# Patient Record
Sex: Male | Born: 1955 | Race: White | Hispanic: No | Marital: Married | Smoking: Former smoker
Health system: Southern US, Community
[De-identification: ages and names within clinical notes are randomized; demographics above are authoritative.]

## PROBLEM LIST (undated history)

## (undated) DIAGNOSIS — I2510 Atherosclerotic heart disease of native coronary artery without angina pectoris: Secondary | ICD-10-CM

## (undated) DIAGNOSIS — I213 ST elevation (STEMI) myocardial infarction of unspecified site: Secondary | ICD-10-CM

## (undated) DIAGNOSIS — I10 Essential (primary) hypertension: Secondary | ICD-10-CM

## (undated) DIAGNOSIS — R599 Enlarged lymph nodes, unspecified: Secondary | ICD-10-CM

## (undated) DIAGNOSIS — C9110 Chronic lymphocytic leukemia of B-cell type not having achieved remission: Secondary | ICD-10-CM

## (undated) HISTORY — PX: REFRACTIVE SURGERY: SHX103

## (undated) HISTORY — DX: Atherosclerotic heart disease of native coronary artery without angina pectoris: I25.10

---

## 2001-08-27 ENCOUNTER — Encounter: Payer: Self-pay | Admitting: Family Medicine

## 2001-08-27 ENCOUNTER — Ambulatory Visit (HOSPITAL_COMMUNITY): Admission: RE | Admit: 2001-08-27 | Discharge: 2001-08-27 | Payer: Self-pay | Admitting: Family Medicine

## 2004-12-24 ENCOUNTER — Other Ambulatory Visit: Admission: RE | Admit: 2004-12-24 | Discharge: 2004-12-24 | Payer: Self-pay | Admitting: Dermatology

## 2014-05-13 NOTE — H&P (Signed)
  NTS SOAP Note  Vital Signs:  Vitals as of: 89/37/3428: Systolic 768: Diastolic 85: Heart Rate 83: Temp 63F: Height 40ft 4in: Weight 272Lbs 0 Ounces: Pain Level 2: BMI 33.11  BMI : 33.11 kg/m2  Subjective: This 58 year old male presents for of two sebaceous cysts.  He has one on the scalp which has become larger and irritated.  Another one on his back is draining white fluid,  tender to touch.  Review of Symptoms:  Constitutional:unremarkable   Head:unremarkable Eyes:unremarkable   Nose/Mouth/Throat:unremarkable Cardiovascular:  unremarkable Respiratory:unremarkable Gastrointestinal:  unremarkable   Genitourinary:unremarkable   knee pain Hematolgic/Lymphatic:unremarkable   Allergic/Immunologic:unremarkable   Past Medical History:  Reviewed  Past Medical History  Surgical History: unremarkable Medical Problems: none Allergies: nkda Medications: none   Social History:Reviewed  Social History  Preferred Language: English Race:  White Ethnicity: Not Hispanic / Latino Age: 58 year Marital Status:  M Alcohol: socially   Smoking Status: Unknown if ever smoked reviewed on 05/13/2014 Functional Status reviewed on 05/13/2014 ------------------------------------------------ Bathing: Normal Cooking: Normal Dressing: Normal Driving: Normal Eating: Normal Managing Meds: Normal Oral Care: Normal Shopping: Normal Toileting: Normal Transferring: Normal Walking: Normal Cognitive Status reviewed on 05/13/2014 ------------------------------------------------ Attention: Normal Decision Making: Normal Language: Normal Memory: Normal Motor: Normal Perception: Normal Problem Solving: Normal Visual and Spatial: Normal   Family History:Reviewed  Family Health History Mother, Living; Diabetes mellitus, unspecified type;  Father, Deceased; Cancer unspecified;     Objective Information: General:Well appearing, well nourished in no  distress. 2.5cm irritated sebaceous cyst on scalp,  2cm draining erythematous cyst with punctum present on left upper back Heart:RRR, no murmur or gallop.  Normal S1, S2.  No S3, S4.  Lungs:  CTA bilaterally, no wheezes, rhonchi, rales.  Breathing unlabored.  Assessment:Sebaceous cysts,  scalp and back  Diagnoses: 706.2  L72.3 Epidermoid cyst of skin (Sebaceous cyst)  Procedures: 11572 - OFFICE OUTPATIENT NEW 30 MINUTES    Plan:  scheduled for excision of sebaceous cysts,  scalp and back on 05/30/14.   Patient Education:Alternative treatments to surgery were discussed with patient (and family).  Risks and benefits  of procedure were fully explained to the patient (and family) who gave informed consent. Patient/family questions were addressed.  Follow-up:Pending Surgery

## 2014-05-25 ENCOUNTER — Inpatient Hospital Stay (HOSPITAL_COMMUNITY): Admission: RE | Admit: 2014-05-25 | Payer: Self-pay | Source: Ambulatory Visit

## 2014-05-30 ENCOUNTER — Ambulatory Visit (HOSPITAL_COMMUNITY): Admission: RE | Admit: 2014-05-30 | Payer: Self-pay | Source: Ambulatory Visit | Admitting: General Surgery

## 2014-05-30 ENCOUNTER — Encounter (HOSPITAL_COMMUNITY): Admission: RE | Payer: Self-pay | Source: Ambulatory Visit

## 2014-05-30 SURGERY — CYST REMOVAL
Anesthesia: General

## 2015-06-06 ENCOUNTER — Other Ambulatory Visit (INDEPENDENT_AMBULATORY_CARE_PROVIDER_SITE_OTHER): Payer: Self-pay | Admitting: Otolaryngology

## 2015-06-06 DIAGNOSIS — R221 Localized swelling, mass and lump, neck: Secondary | ICD-10-CM

## 2015-06-14 ENCOUNTER — Ambulatory Visit (HOSPITAL_COMMUNITY)
Admission: RE | Admit: 2015-06-14 | Discharge: 2015-06-14 | Disposition: A | Discharge status: Home / Self Care | Payer: 59 | Source: Ambulatory Visit | Attending: Otolaryngology | Admitting: Otolaryngology

## 2015-06-14 DIAGNOSIS — R590 Localized enlarged lymph nodes: Secondary | ICD-10-CM | POA: Diagnosis not present

## 2015-06-14 DIAGNOSIS — R221 Localized swelling, mass and lump, neck: Secondary | ICD-10-CM | POA: Insufficient documentation

## 2015-06-14 MED ORDER — IOHEXOL 300 MG/ML  SOLN
75.0000 mL | Freq: Once | INTRAMUSCULAR | Status: AC | PRN
  Administered 2015-06-14: 75 mL via INTRAVENOUS

## 2015-06-21 ENCOUNTER — Other Ambulatory Visit: Payer: Self-pay | Admitting: Otolaryngology

## 2015-06-21 ENCOUNTER — Encounter (HOSPITAL_BASED_OUTPATIENT_CLINIC_OR_DEPARTMENT_OTHER): Payer: Self-pay | Admitting: *Deleted

## 2015-06-27 ENCOUNTER — Encounter (HOSPITAL_BASED_OUTPATIENT_CLINIC_OR_DEPARTMENT_OTHER): Payer: Self-pay | Admitting: *Deleted

## 2015-06-27 ENCOUNTER — Ambulatory Visit (HOSPITAL_BASED_OUTPATIENT_CLINIC_OR_DEPARTMENT_OTHER)
Admission: RE | Admit: 2015-06-27 | Discharge: 2015-06-27 | Disposition: A | Discharge status: Home / Self Care | Payer: 59 | Source: Ambulatory Visit | Attending: Otolaryngology | Admitting: Otolaryngology

## 2015-06-27 ENCOUNTER — Ambulatory Visit (HOSPITAL_BASED_OUTPATIENT_CLINIC_OR_DEPARTMENT_OTHER): Payer: 59 | Admitting: Anesthesiology

## 2015-06-27 ENCOUNTER — Encounter (HOSPITAL_BASED_OUTPATIENT_CLINIC_OR_DEPARTMENT_OTHER)
Admission: RE | Disposition: A | Discharge status: Home / Self Care | Payer: Self-pay | Source: Ambulatory Visit | Attending: Otolaryngology

## 2015-06-27 DIAGNOSIS — C8591 Non-Hodgkin lymphoma, unspecified, lymph nodes of head, face, and neck: Secondary | ICD-10-CM | POA: Insufficient documentation

## 2015-06-27 DIAGNOSIS — R591 Generalized enlarged lymph nodes: Secondary | ICD-10-CM | POA: Diagnosis present

## 2015-06-27 HISTORY — PX: MASS BIOPSY: SHX5445

## 2015-06-27 HISTORY — DX: Enlarged lymph nodes, unspecified: R59.9

## 2015-06-27 SURGERY — BIOPSY, MASS, NECK
Anesthesia: General | Site: Neck | Laterality: Left

## 2015-06-27 MED ORDER — MIDAZOLAM HCL 2 MG/2ML IJ SOLN
INTRAMUSCULAR | Status: AC
  Filled 2015-06-27: qty 2

## 2015-06-27 MED ORDER — LIDOCAINE HCL (CARDIAC) 20 MG/ML IV SOLN
INTRAVENOUS | Status: DC | PRN
  Administered 2015-06-27: 50 mg via INTRAVENOUS

## 2015-06-27 MED ORDER — LIDOCAINE-EPINEPHRINE 1 %-1:100000 IJ SOLN
INTRAMUSCULAR | Status: AC
  Filled 2015-06-27: qty 1

## 2015-06-27 MED ORDER — FENTANYL CITRATE (PF) 100 MCG/2ML IJ SOLN
50.0000 ug | INTRAMUSCULAR | Status: DC | PRN
  Administered 2015-06-27: 100 ug via INTRAVENOUS

## 2015-06-27 MED ORDER — LACTATED RINGERS IV SOLN
INTRAVENOUS | Status: DC
  Administered 2015-06-27: 10 mL/h via INTRAVENOUS

## 2015-06-27 MED ORDER — LIDOCAINE HCL (CARDIAC) 20 MG/ML IV SOLN
INTRAVENOUS | Status: AC
  Filled 2015-06-27: qty 5

## 2015-06-27 MED ORDER — ONDANSETRON HCL 4 MG/2ML IJ SOLN
INTRAMUSCULAR | Status: DC | PRN
  Administered 2015-06-27: 4 mg via INTRAVENOUS

## 2015-06-27 MED ORDER — PROMETHAZINE HCL 25 MG/ML IJ SOLN: 6.2500 mg | INTRAMUSCULAR | Status: DC | PRN

## 2015-06-27 MED ORDER — SCOPOLAMINE 1 MG/3DAYS TD PT72: 1.0000 | MEDICATED_PATCH | Freq: Once | TRANSDERMAL | Status: DC

## 2015-06-27 MED ORDER — PROPOFOL 10 MG/ML IV BOLUS
INTRAVENOUS | Status: DC | PRN
  Administered 2015-06-27: 200 mg via INTRAVENOUS

## 2015-06-27 MED ORDER — LIDOCAINE-EPINEPHRINE 1 %-1:100000 IJ SOLN
INTRAMUSCULAR | Status: DC | PRN
  Administered 2015-06-27: 3 mL

## 2015-06-27 MED ORDER — HYDROCODONE-ACETAMINOPHEN 7.5-325 MG PO TABS: 1.0000 | ORAL_TABLET | Freq: Once | ORAL | Status: DC | PRN

## 2015-06-27 MED ORDER — AMOXICILLIN 875 MG PO TABS: 875.0000 mg | ORAL_TABLET | Freq: Two times a day (BID) | ORAL | Status: DC

## 2015-06-27 MED ORDER — MIDAZOLAM HCL 2 MG/2ML IJ SOLN
1.0000 mg | INTRAMUSCULAR | Status: DC | PRN
  Administered 2015-06-27: 2 mg via INTRAVENOUS

## 2015-06-27 MED ORDER — HYDROMORPHONE HCL 1 MG/ML IJ SOLN: 0.2500 mg | INTRAMUSCULAR | Status: DC | PRN

## 2015-06-27 MED ORDER — OXYCODONE-ACETAMINOPHEN 5-325 MG PO TABS: 1.0000 | ORAL_TABLET | ORAL | Status: DC | PRN

## 2015-06-27 MED ORDER — DEXAMETHASONE SODIUM PHOSPHATE 4 MG/ML IJ SOLN
INTRAMUSCULAR | Status: DC | PRN
  Administered 2015-06-27: 10 mg via INTRAVENOUS

## 2015-06-27 MED ORDER — FENTANYL CITRATE (PF) 100 MCG/2ML IJ SOLN
INTRAMUSCULAR | Status: AC
  Filled 2015-06-27: qty 2

## 2015-06-27 MED ORDER — ONDANSETRON HCL 4 MG/2ML IJ SOLN
INTRAMUSCULAR | Status: AC
  Filled 2015-06-27: qty 2

## 2015-06-27 MED ORDER — GLYCOPYRROLATE 0.2 MG/ML IJ SOLN: 0.2000 mg | Freq: Once | INTRAMUSCULAR | Status: DC | PRN

## 2015-06-27 SURGICAL SUPPLY — 67 items
APL SKNCLS STERI-STRIP NONHPOA (GAUZE/BANDAGES/DRESSINGS)
BENZOIN TINCTURE PRP APPL 2/3 (GAUZE/BANDAGES/DRESSINGS) IMPLANT
BLADE SURG 15 STRL LF DISP TIS (BLADE) ×1 IMPLANT
BLADE SURG 15 STRL SS (BLADE) ×2
CANISTER SUCT 1200ML W/VALVE (MISCELLANEOUS) ×1 IMPLANT
CLEANER CAUTERY TIP 5X5 PAD (MISCELLANEOUS) IMPLANT
CLIP TI MEDIUM 6 (CLIP) IMPLANT
CLIP TI WIDE RED SMALL 6 (CLIP) IMPLANT
CORDS BIPOLAR (ELECTRODE) ×1 IMPLANT
COVER BACK TABLE 60X90IN (DRAPES) ×2 IMPLANT
COVER MAYO STAND STRL (DRAPES) ×2 IMPLANT
DECANTER SPIKE VIAL GLASS SM (MISCELLANEOUS) IMPLANT
DRAIN JACKSON RD 7FR 3/32 (WOUND CARE) IMPLANT
DRAIN PENROSE 1/4X12 LTX STRL (WOUND CARE) IMPLANT
DRAIN TLS ROUND 10FR (DRAIN) IMPLANT
DRAPE U-SHAPE 76X120 STRL (DRAPES) ×2 IMPLANT
ELECT COATED BLADE 2.86 ST (ELECTRODE) ×2 IMPLANT
ELECT NDL BLADE 2-5/6 (NEEDLE) IMPLANT
ELECT NEEDLE BLADE 2-5/6 (NEEDLE) IMPLANT
ELECT PAIRED SUBDERMAL (MISCELLANEOUS)
ELECT REM PT RETURN 9FT ADLT (ELECTROSURGICAL) ×2
ELECTRODE PAIRED SUBDERMAL (MISCELLANEOUS) IMPLANT
ELECTRODE REM PT RTRN 9FT ADLT (ELECTROSURGICAL) ×1 IMPLANT
EVACUATOR SILICONE 100CC (DRAIN) IMPLANT
FORCEPS BIPOLAR SPETZLER 8 1.0 (NEUROSURGERY SUPPLIES) ×1 IMPLANT
GAUZE SPONGE 4X4 16PLY XRAY LF (GAUZE/BANDAGES/DRESSINGS) IMPLANT
GLOVE BIO SURGEON STRL SZ7.5 (GLOVE) ×2 IMPLANT
GLOVE SURG SS PI 7.0 STRL IVOR (GLOVE) ×1 IMPLANT
GOWN STRL REUS W/ TWL LRG LVL3 (GOWN DISPOSABLE) ×2 IMPLANT
GOWN STRL REUS W/TWL LRG LVL3 (GOWN DISPOSABLE) ×4
HEMOSTAT SURGICEL .5X2 ABSORB (HEMOSTASIS) IMPLANT
LIQUID BAND (GAUZE/BANDAGES/DRESSINGS) ×1 IMPLANT
LOCATOR NERVE 3 VOLT (DISPOSABLE) IMPLANT
NDL HYPO 25X1 1.5 SAFETY (NEEDLE) ×1 IMPLANT
NDL PRECISIONGLIDE 27X1.5 (NEEDLE) IMPLANT
NEEDLE HYPO 25X1 1.5 SAFETY (NEEDLE) ×2 IMPLANT
NEEDLE PRECISIONGLIDE 27X1.5 (NEEDLE) IMPLANT
NS IRRIG 1000ML POUR BTL (IV SOLUTION) ×2 IMPLANT
PACK BASIN DAY SURGERY FS (CUSTOM PROCEDURE TRAY) ×2 IMPLANT
PAD CLEANER CAUTERY TIP 5X5 (MISCELLANEOUS)
PENCIL BUTTON HOLSTER BLD 10FT (ELECTRODE) ×2 IMPLANT
PIN SAFETY STERILE (MISCELLANEOUS) IMPLANT
PROBE NERVBE PRASS .33 (MISCELLANEOUS) IMPLANT
SHEARS HARMONIC 9CM CVD (BLADE) IMPLANT
SLEEVE SCD COMPRESS KNEE MED (MISCELLANEOUS) ×1 IMPLANT
SPONGE GAUZE 2X2 8PLY STRL LF (GAUZE/BANDAGES/DRESSINGS) IMPLANT
SPONGE GAUZE 4X4 12PLY STER LF (GAUZE/BANDAGES/DRESSINGS) IMPLANT
STAPLER VISISTAT 35W (STAPLE) IMPLANT
STRIP CLOSURE SKIN 1/4X4 (GAUZE/BANDAGES/DRESSINGS) IMPLANT
SUCTION FRAZIER TIP 10 FR DISP (SUCTIONS) IMPLANT
SUT ETHILON 3 0 PS 1 (SUTURE) IMPLANT
SUT ETHILON 4 0 PS 2 18 (SUTURE) IMPLANT
SUT PROLENE 5 0 P 3 (SUTURE) IMPLANT
SUT SILK 3 0 TIES 17X18 (SUTURE)
SUT SILK 3-0 18XBRD TIE BLK (SUTURE) IMPLANT
SUT SILK 4 0 TIES 17X18 (SUTURE) IMPLANT
SUT VIC AB 3-0 FS2 27 (SUTURE) IMPLANT
SUT VIC AB 4-0 P-3 18XBRD (SUTURE) IMPLANT
SUT VIC AB 4-0 P3 18 (SUTURE)
SUT VIC AB 4-0 RB1 27 (SUTURE)
SUT VIC AB 4-0 RB1 27X BRD (SUTURE) IMPLANT
SUT VICRYL 4-0 PS2 18IN ABS (SUTURE) ×2 IMPLANT
SYR BULB 3OZ (MISCELLANEOUS) ×1 IMPLANT
SYR CONTROL 10ML LL (SYRINGE) ×2 IMPLANT
TOWEL OR 17X24 6PK STRL BLUE (TOWEL DISPOSABLE) ×2 IMPLANT
TRAY DSU PREP LF (CUSTOM PROCEDURE TRAY) ×2 IMPLANT
TUBE CONNECTING 20X1/4 (TUBING) ×1 IMPLANT

## 2015-06-27 NOTE — Discharge Instructions (Addendum)
The patient may resume all his previous activities and diet. He will follow-up in my office in one week.   Post Anesthesia Home Care Instructions  Activity: Get plenty of rest for the remainder of the day. A responsible adult should stay with you for 24 hours following the procedure.  For the next 24 hours, DO NOT: -Drive a car -Paediatric nurse -Drink alcoholic beverages -Take any medication unless instructed by your physician -Make any legal decisions or sign important papers.  Meals: Start with liquid foods such as gelatin or soup. Progress to regular foods as tolerated. Avoid greasy, spicy, heavy foods. If nausea and/or vomiting occur, drink only clear liquids until the nausea and/or vomiting subsides. Call your physician if vomiting continues.  Special Instructions/Symptoms: Your throat may feel dry or sore from the anesthesia or the breathing tube placed in your throat during surgery. If this causes discomfort, gargle with warm salt water. The discomfort should disappear within 24 hours.  If you had a scopolamine patch placed behind your ear for the management of post- operative nausea and/or vomiting:  1. The medication in the patch is effective for 72 hours, after which it should be removed.  Wrap patch in a tissue and discard in the trash. Wash hands thoroughly with soap and water. 2. You may remove the patch earlier than 72 hours if you experience unpleasant side effects which may include dry mouth, dizziness or visual disturbances. 3. Avoid touching the patch. Wash your hands with soap and water after contact with the patch.   Call your surgeon if you experience:   1.  Fever over 101.0. 2.  Inability to urinate. 3.  Nausea and/or vomiting. 4.  Extreme swelling or bruising at the surgical site. 5.  Continued bleeding from the incision. 6.  Increased pain, redness or drainage from the incision. 7.  Problems related to your pain medication. 8. Any change in color, movement  and/or sensation 9. Any problems and/or concerns

## 2015-06-27 NOTE — Op Note (Signed)
DATE OF PROCEDURE:  06/27/2015                              OPERATIVE REPORT  SURGEON:  Leta Baptist, MD  PREOPERATIVE DIAGNOSES: 1. Bilateral cervical lymphadenopathy  POSTOPERATIVE DIAGNOSES: 1. Bilateral cervical lymphadenopathy  PROCEDURE PERFORMED:  Open excisional biopsy of left level II cervical lymph node (CPT 38510)  ANESTHESIA:  General LMA  COMPLICATIONS:  None.  ESTIMATED BLOOD LOSS:  Minimal.  INDICATION FOR PROCEDURE:  Adam Reyes is a 59 y.o. male with a history of bilateral enlarged neck masses. He was evaluated with a neck CT scan. The CT showed multiple bilateral cervical lymphadenopathy. The findings were concerning for lymphoma. Open biopsy was recommended. The risks, benefits, alternatives, and details of the procedure were discussed with the patient. Questions were invited and answered.  Informed consent was obtained.  DESCRIPTION:  The patient was taken to the operating room and placed supine on the operating table.  General LMA was administered by the anesthesiologist.  The patient was positioned and prepped and draped in a standard fashion for open biopsy of his left neck mass.   1% lidocaine with 1-100,000 epinephrine were was infiltrated at the planned site of incision. A 4 cm horizontal incision was made over the left level II neck. The incision was carried down to the level of the strap muscles. Superiorly and inferiorly based subplatysmal flaps were elevated in the standard fashion. The sternocleidomastoid muscle was retracted laterally, exposing a large 3 cm enlarged lymph node. The entire mass was excised, together with a smaller 1 cm lymph node. Hemostasis was achieved. The surgical site was copiously irrigated. The specimen was sent fresh to the pathology department for lymphoma protocol workup.  The care of the patient was turned over to the anesthesiologist.  The patient was awakened from anesthesia without difficulty.  He was extubated and transferred to  the recovery room in good condition.  OPERATIVE FINDINGS:  A 3 cm enlarged left level II lymph node was removed. A smaller 1 cm lymph node was also removed.  SPECIMEN:  Left neck lymphadenopathy.  FOLLOWUP CARE:  The patient will be discharged home once awake and alert.  He will be placed on amoxicillin 875 mg p.o. b.i.d. for 5 days.  Tylenol with oxycodone can be taken on a p.r.n. basis for pain control.  The patient will follow up in my office in approximately 1 week.  Adam Reyes,SUI W 06/27/2015 11:30 AM

## 2015-06-27 NOTE — Transfer of Care (Signed)
Immediate Anesthesia Transfer of Care Note  Patient: Adam Reyes  Procedure(s) Performed: Procedure(s): OPEN LEFT NECK BIOPSY  (Left)  Patient Location: PACU  Anesthesia Type:General  Level of Consciousness: sedated  Airway & Oxygen Therapy: Patient Spontanous Breathing and Patient connected to face mask oxygen  Post-op Assessment: Report given to RN and Post -op Vital signs reviewed and stable  Post vital signs: Reviewed and stable  Last Vitals:  Filed Vitals:   06/27/15 1028  BP: 143/79  Pulse: 79  Temp: 36.9 C  Resp: 20    Complications: No apparent anesthesia complications

## 2015-06-27 NOTE — H&P (Signed)
Cc: Bilateral neck masses.  HPI: The patient is a 59 year old male who returns today with his wife. The patient was last seen 2 weeks ago.  At that time, he was noted to have bilateral neck masses.  He subsequently underwent a neck CT scan.  The CT shows multiple bilateral cervical lymphadenopathy.  The findings were concerning for lymphoma. Open biopsy was recommended.  According to the patient, his neck masses have increased in size.  He denies any dysphagia, odynophagia or dyspnea.  He also denies any night sweats or recent weight loss.  He has no other complaint today.  No other ENT, GI, or respiratory issue noted since the last visit.   Exam General: Communicates without difficulty, well nourished, no acute distress. Head: Normocephalic, no evidence injury, no tenderness, facial buttresses intact without stepoff. Eyes: PERRL, EOMI.  No scleral icterus, conjunctivae clear. Ears: External auditory canals clear bilaterally.  There is no edema or erythema.  Tympanic membrane is within normal limits bilaterally. Nose: Normal skin and external support.  Anterior rhinoscopy reveals healthy pink mucosa over the septum and turbinates.  No lesions or polyps were seen. Oral cavity: Lips without lesions, oral mucosa moist, no masses or lesions seen. Indirect  mirror laryngoscopy could not be tolerated. Pharynx: Clear, no erythema. Neck: Supple, full range of motion. A palpable 3 cm mass noted on the left. A smaller palpable mass is also noted on the right.  Salivary: Parotid and submandibular glands without mass. Neuro:  CN 2-12 grossly intact. Gait normal. Vestibular: No nystagmus at any point of gaze.   Assessment 1.  Bilateral cervical lymphadenopathy, worse on the left side.  The findings are concerning for lymphoma.  Plan  1.  The physical exam findings and CT images are reviewed with the patient and his wife.  2.  The patient will need to undergo open biopsy of one of his lymph nodes.  The decision is  made to proceed with open biopsy of his left Level II cervical lymph node.  3.  The risks, benefits, alternatives and details of the procedure are reviewed.  Questions are invited and answered.  4.  The patient would like to proceed with the procedure.

## 2015-06-27 NOTE — Anesthesia Procedure Notes (Signed)
Procedure Name: LMA Insertion Date/Time: 06/27/2015 10:57 AM Performed by: Lieutenant Diego Pre-anesthesia Checklist: Patient identified, Emergency Drugs available, Suction available and Patient being monitored Patient Re-evaluated:Patient Re-evaluated prior to inductionOxygen Delivery Method: Circle System Utilized Preoxygenation: Pre-oxygenation with 100% oxygen Intubation Type: IV induction Ventilation: Mask ventilation without difficulty LMA: LMA inserted LMA Size: 5.0 Number of attempts: 1 Airway Equipment and Method: Bite block Placement Confirmation: positive ETCO2 and breath sounds checked- equal and bilateral Tube secured with: Tape Dental Injury: Teeth and Oropharynx as per pre-operative assessment

## 2015-06-27 NOTE — Anesthesia Postprocedure Evaluation (Signed)
Anesthesia Post Note  Patient: Adam Reyes  Procedure(s) Performed: Procedure(s) (LRB): OPEN LEFT NECK BIOPSY  (Left)  Patient location during evaluation: PACU Anesthesia Type: General Level of consciousness: awake and alert Pain management: pain level controlled Vital Signs Assessment: post-procedure vital signs reviewed and stable Respiratory status: spontaneous breathing Cardiovascular status: blood pressure returned to baseline Anesthetic complications: no    Last Vitals:  Filed Vitals:   06/27/15 1145 06/27/15 1200  BP: 127/78   Pulse: 76 75  Temp:    Resp: 14 22    Last Pain: There were no vitals filed for this visit.               [name]

## 2015-06-27 NOTE — Anesthesia Preprocedure Evaluation (Addendum)
Anesthesia Evaluation  Patient identified by MRN, date of birth, ID band Patient awake    Reviewed: Allergy & Precautions, NPO status , Patient's Chart, lab work & pertinent test results  Airway Mallampati: II  TM Distance: >3 FB Neck ROM: Full    Dental  (+) Dental Advisory Given   Pulmonary neg pulmonary ROS,    breath sounds clear to auscultation       Cardiovascular negative cardio ROS   Rhythm:Regular Rate:Normal     Neuro/Psych negative neurological ROS     GI/Hepatic negative GI ROS, Neg liver ROS,   Endo/Other  negative endocrine ROS  Renal/GU negative Renal ROS     Musculoskeletal   Abdominal   Peds  Hematology negative hematology ROS (+)   Anesthesia Other Findings   Reproductive/Obstetrics                            Anesthesia Physical Anesthesia Plan  ASA: II  Anesthesia Plan: General   Post-op Pain Management:    Induction: Intravenous  Airway Management Planned: LMA  Additional Equipment:   Intra-op Plan:   Post-operative Plan: Extubation in OR  Informed Consent: I have reviewed the patients History and Physical, chart, labs and discussed the procedure including the risks, benefits and alternatives for the proposed anesthesia with the patient or authorized representative who has indicated his/her understanding and acceptance.   Dental advisory given  Plan Discussed with: CRNA  Anesthesia Plan Comments:         Anesthesia Quick Evaluation

## 2015-06-28 ENCOUNTER — Encounter (HOSPITAL_BASED_OUTPATIENT_CLINIC_OR_DEPARTMENT_OTHER): Payer: Self-pay | Admitting: Otolaryngology

## 2015-07-11 ENCOUNTER — Encounter (HOSPITAL_COMMUNITY): Payer: Self-pay | Admitting: Hematology & Oncology

## 2015-07-11 ENCOUNTER — Encounter (HOSPITAL_COMMUNITY): Payer: 59 | Attending: Hematology & Oncology | Admitting: Hematology & Oncology

## 2015-07-11 ENCOUNTER — Encounter: Payer: Self-pay | Admitting: Hematology & Oncology

## 2015-07-11 VITALS — BP 143/70 | HR 81 | Temp 97.7°F | Resp 20 | Ht 76.0 in | Wt 270.7 lb

## 2015-07-11 DIAGNOSIS — C9110 Chronic lymphocytic leukemia of B-cell type not having achieved remission: Secondary | ICD-10-CM

## 2015-07-11 DIAGNOSIS — R590 Localized enlarged lymph nodes: Secondary | ICD-10-CM

## 2015-07-11 DIAGNOSIS — Z23 Encounter for immunization: Secondary | ICD-10-CM | POA: Diagnosis not present

## 2015-07-11 LAB — CBC WITH DIFFERENTIAL/PLATELET
BASOS ABS: 0 10*3/uL (ref 0.0–0.1)
BASOS PCT: 1 %
Eosinophils Absolute: 0.2 10*3/uL (ref 0.0–0.7)
Eosinophils Relative: 2 %
HEMATOCRIT: 43.2 % (ref 39.0–52.0)
HEMOGLOBIN: 15.1 g/dL (ref 13.0–17.0)
LYMPHS PCT: 27 %
Lymphs Abs: 1.8 10*3/uL (ref 0.7–4.0)
MCH: 31.7 pg (ref 26.0–34.0)
MCHC: 35 g/dL (ref 30.0–36.0)
MCV: 90.6 fL (ref 78.0–100.0)
Monocytes Absolute: 0.6 10*3/uL (ref 0.1–1.0)
Monocytes Relative: 9 %
NEUTROS ABS: 4.2 10*3/uL (ref 1.7–7.7)
NEUTROS PCT: 61 %
Platelets: 141 10*3/uL — ABNORMAL LOW (ref 150–400)
RBC: 4.77 MIL/uL (ref 4.22–5.81)
RDW: 12.9 % (ref 11.5–15.5)
WBC: 6.8 10*3/uL (ref 4.0–10.5)

## 2015-07-11 LAB — COMPREHENSIVE METABOLIC PANEL
ALBUMIN: 4.5 g/dL (ref 3.5–5.0)
ALT: 32 U/L (ref 17–63)
AST: 27 U/L (ref 15–41)
Alkaline Phosphatase: 60 U/L (ref 38–126)
Anion gap: 5 (ref 5–15)
BILIRUBIN TOTAL: 0.6 mg/dL (ref 0.3–1.2)
BUN: 17 mg/dL (ref 6–20)
CHLORIDE: 105 mmol/L (ref 101–111)
CO2: 27 mmol/L (ref 22–32)
CREATININE: 1.08 mg/dL (ref 0.61–1.24)
Calcium: 9 mg/dL (ref 8.9–10.3)
GFR calc Af Amer: >60 mL/min (ref >60)
GLUCOSE: 104 mg/dL — AB (ref 65–99)
POTASSIUM: 3.4 mmol/L — AB (ref 3.5–5.1)
Sodium: 137 mmol/L (ref 135–145)
TOTAL PROTEIN: 7 g/dL (ref 6.5–8.1)

## 2015-07-11 LAB — LACTATE DEHYDROGENASE: LDH: 167 U/L (ref 98–192)

## 2015-07-11 MED ORDER — INFLUENZA VAC SPLIT QUAD 0.5 ML IM SUSY
PREFILLED_SYRINGE | INTRAMUSCULAR | Status: AC
  Filled 2015-07-11: qty 0.5

## 2015-07-11 MED ORDER — INFLUENZA VAC SPLIT QUAD 0.5 ML IM SUSY
0.5000 mL | PREFILLED_SYRINGE | Freq: Once | INTRAMUSCULAR | Status: AC
  Administered 2015-07-11: 0.5 mL via INTRAMUSCULAR

## 2015-07-11 NOTE — Patient Instructions (Addendum)
[place] [facility] Discharge Instructions  RECOMMENDATIONS MADE BY THE CONSULTANT AND ANY TEST RESULTS WILL BE SENT TO YOUR REFERRING PHYSICIAN.    Exam completed by Dr Whitney Muse today CLL-Chronic lymphocytic leukemia  Right now there is not treatment that needs to be done It can be many years before anything has to be done   When we would treat: Blood counts become abnormal (white blood counts doubles), your adenopathies are getting much larger, fatigue increased and appetite decreased    If you have abnormal night sweats, fevers, or any new lumps or bump please notify us  Bone marrow biopsy is recommended at the time before you start treatment  Imbruvica would be a front line treatment for CLL.  Hildred Alamin is our Art therapist.  You can meet her at your next visit.  She is a good person to contact if you have questions.  Referral to GI for screening colonoscopy.  Blood work today CT scans waist down since you have already gotten waist up Flu vaccine recommended every year, live vaccines are not recommended (like shingles) Flu vaccine today Return to see the doctor in 3 weeks. Please call the clinic if you have any questions or concerns.     Thank you for choosing [place] [facility] to provide your oncology and hematology care.  To afford each patient quality time with our provider, please arrive at least 15 minutes before your scheduled appointment time.    You need to re-schedule your appointment should you arrive 10 or more minutes late.  We strive to give you quality time with our providers, and arriving late affects you and other patients whose appointments are after yours.  Also, if you no show three or more times for appointments you may be dismissed from the clinic at the providers discretion.     Again, thank you for choosing [facility].  Our hope is that these requests will decrease the amount of time  that you wait before being seen by our physicians.       _____________________________________________________________  Should you have questions after your visit to [facility], please contact our office at [phone] between the hours of 8:30 a.m. and 4:30 p.m.  Voicemails left after 4:30 p.m. will not be returned until the following business day.  For prescription refill requests, have your pharmacy contact our office.        Chronic Lymphocytic Leukemia Chronic lymphocytic leukemia (CLL) is a type of cancer of the bone marrow and blood cells. Bone marrow is the soft, spongy tissue inside your bone. In CLL, the bone marrow makes too many white blood cells that usually fight infection in the body (lymphocytes). CLL usually gets worse slowly and is the most common type of adult leukemia.  RISK FACTORS No one knows the exact cause of CLL. There is a higher risk of CLL in people who:   Are older than 50 years.  Are white.  Are male.  Have a family history of CLL or other cancers of the lymph system.  Are of [place] or [place] descent.  Have been exposed to certain chemicals, such as Agent Orange (used in the [place] War) or other herbicides or insecticides. SYMPTOMS  At first, there may be no symptoms of chronic lymphocytic leukemia. After a while, some symptoms may occur, such as:   Feeling more tired than usual, even after rest.  Unplanned  weight loss.  Heavy sweating at night.  Fevers.  Shortness of breath.  Decreased energy.  Paleness.  Painless, swollen lymph nodes.  A feeling of fullness in the upper left part of the abdomen.  Easy bruising or bleeding.  More frequent infections. DIAGNOSIS  Your health care provider may perform the following exams and tests to diagnose CLL:  Physical exam to check for an enlarged spleen, liver, or lymph nodes.  Blood and bone marrow tests to identify the presence of cancer cells. These  may include tests such as complete blood count, flow cytometry, immunophenotyping, and fluorescence in situ hybridization (FISH).  CT scan to look for swelling or abnormalities in your spleen, liver, and lymph nodes. TREATMENT  Treatment options for CLL depend on the stage and the presence of symptoms. There are a number of types of treatment used for this condition, including:  Observation.  Targeted drugs. These are drugs that interfere with chemicals that leukemia cells need in order to grow and multiply. They identify and attack specific cancer cells without harming normal cells.  Chemotherapy drugs. These medicines kill cells that are multiplying quickly, such as leukemia cells.  Radiation.  Surgery to remove the spleen.  Biological therapy. This treatment boosts the ability of your own immune system to fight the leukemia cells.  Bone marrow or peripheral blood stem cell transplant. This treatment allows the patient to receive very high doses of chemotherapy or radiation or both. These high doses kill the cancer cells but also destroy the bone marrow. After treatment is complete, you are given donor bone marrow or stem cells, which will replace the bone marrow. HOME CARE INSTRUCTIONS   Because you have an increased risk of infection, practice good hand washing and avoid being around people who are ill or being in crowded places.  Because you have an increased risk of bleeding and bruising, avoid contact sports or other rough activities.  Take medicines only directed by your health care provider.  Although some of your treatments might affect your appetite, try to eat regular, healthy meals.  If you develop any side effects, such as nausea, diarrhea, rash, white patches in your mouth, a sore throat, difficulty swallowing, or severe fatigue, tell your health care provider. He or she may have recommendations of things you can do to improve symptoms.  Consider learning some ways to  cope with the stress of having a chronic illness, such as yoga, meditation, or participating in a support group. SEEK MEDICAL CARE IF:  You develop chest pains.  You notice pain, swelling or redness anywhere in your legs.  You have pain in your belly (abdomen).  You develop new bruises that are getting bigger.  You have painful or more swollen lymph nodes.  You develop bleeding from your gums, nose, or in your urine or stools.  You are unable to stop throwing up (vomiting).  You cannot keep liquids down.  You feel light-headed.  You have a fever.  You develop a severe stiff neck or headache. SEEK IMMEDIATE MEDICAL CARE IF:  You have trouble breathing or feel short of breath.  You faint.   This information is not intended to replace advice given to you by your health care provider. Make sure you discuss any questions you have with your health care provider.   Document Released: 11/17/2008 Document Revised: 07/22/2014 Document Reviewed: 12/24/2012 Elsevier Interactive Patient Education 2016 [employer] What is this medicine? IBRUTINIB (eye BROO ti nib) is a  medicine that targets proteins in cancer cells and stops the cancer cells from growing. It is used to treat mantle cell lymphoma, chronic lymphocytic leukemia, small lymphocytic lymphoma, and Waldenstrom macroglobulinemia. This medicine may be used for other purposes; ask your health care provider or pharmacist if you have questions. What should I tell my health care provider before I take this medicine? They need to know if you have any of these conditions: -bleeding disorders -diabetes -heart disease -high blood pressure -high cholesterol -history of irregular heartbeat -infection -liver disease -recent surgery -smoke tobacco -take medicines that treat or prevent blood clots -an unusual or allergic reaction to ibrutinib, other medicines, foods, dyes, or preservatives -pregnant or trying  to get pregnant -breast-feeding How should I use this medicine? Take this medicine by mouth with a glass of water. Follow the directions on the prescription label. Do not cut, crush or chew this medicine. Do not take with grapefruit juice or eat [place] oranges. Take your medicine at regular intervals. Do not take it more often than directed. Do not stop taking except on your doctor's advice. Talk to your pediatrician regarding the use of this medicine in children. Special care may be needed. Overdosage: If you think you have taken too much of this medicine contact a poison control center or emergency room at once. NOTE: This medicine is only for you. Do not share this medicine with others. What if I miss a dose? If you miss a dose, take it as soon as you can. If it is almost time for your next dose, take only that dose. Do not take double or extra doses. What may interact with this medicine? Do not take this medicine with any of the following medications: -boceprevir -bosentan -carbamazepine -certain medicines for fungal infections like ketoconazole, itraconazole, posaconazole, and voriconazole -chloramphenicol -clarithromycin -conivaptan -delavirdine -efavirenz -enzalutamide -grapefruit juice or [place] oranges -indinavir -isoniazid -lanreotide or octreotide -nefazodone -nelfinavir -nevirapine -nicardipine -phenobarbital -phenytoin -rifampin -ritonavir -saquinavir -[place] oranges -st. john's wort -telaprevir -telithromycin -tipranavir This medicine may also interact with the following medications: -amiodarone -amitriptyline -amprenavir or fosamprenavir -aprepitant or fosaprepitant -atazanavir -bromocriptine -ciprofloxacin -crizotinib -danazol -darunavir -dasatinib -digoxin -diltiazem -erythromycin -fluconazole -fluvoxamine -imatinib -lapatinib -methotrexate -mifepristone, RU-486 -quinine -verapamil -zafirlukast This list may not describe all possible  interactions. Give your health care provider a list of all the medicines, herbs, non-prescription drugs, or dietary supplements you use. Also tell them if you smoke, drink alcohol, or use illegal drugs. Some items may interact with your medicine. What should I watch for while using this medicine? This drug may make you feel generally unwell. This is not uncommon, as chemotherapy can affect healthy cells as well as cancer cells. Report any side effects. Continue your course of treatment even though you feel ill unless your doctor tells you to stop. Do not become pregnant while taking this medicine or for 1 month after stopping it. Women should inform their doctor if they wish to become pregnant or think they might be pregnant. Men should not father a child while taking this medicine and for 1 month after stopping it. There is a potential for serious side effects to an unborn child. Talk to your health care professional or pharmacist for more information. This medicine may increase your risk to bruise or bleed. Call your doctor or health care professional if you notice any unusual bleeding. If you are going to have surgery or any other procedures, tell your doctor you are taking this medicine. Tell your dentist and dental surgeon  that you are taking this medicine. You should not have major dental surgery while on this medicine. See your dentist to have a dental exam and fix any dental problems before starting this medicine. Call your doctor or health care professional for advice if you get a fever, chills or sore throat, or other symptoms of a cold or flu. Do not treat yourself. This drug decreases your body's ability to fight infections. Try to avoid being around people who are sick. You may need blood work done while you are taking this medicine. Talk to your doctor about your risk of cancer. You may be more at risk for certain types of cancers if you take this medicine. What side effects may I notice from  receiving this medicine? Side effects that you should report to your doctor or health care professional as soon as possible: -allergic reactions like skin rash, itching or hives, swelling of the face, lips, or tongue -low blood counts - this medicine may decrease the number of white blood cells, red blood cells and platelets. You may be at increased risk for infections and bleeding -signs or symptoms of bleeding such as bloody or black, tarry stools; red or dark-brown urine; spitting up blood or brown material that looks like coffee grounds; red spots on the skin; unusual bruising or bleeding from the eye, gums, or nose; confusion; trouble speaking or understanding; severe headaches; weakness; or dizziness -signs and symptoms of a dangerous change in heartbeat or heart rhythm like chest pain; dizziness; fast or irregular heartbeat; palpitations; feeling faint or lightheaded, falls; breathing problems -signs and symptoms of infection like fever or chills; cough; sore throat; or pain when urinating -signs and symptoms of kidney injury like trouble passing urine or change in the amount of urine Side effects that usually do not require medical attention (report to your doctor or health care professional if they continue or are bothersome): -bone pain -diarrhea -muscle pain -nausea -tiredness This list may not describe all possible side effects. Call your doctor for medical advice about side effects. You may report side effects to FDA at 1-800-FDA-1088. Where should I keep my medicine? Keep out of the reach of children. Store between 20 and 25 degrees C (68 and 77 degrees F). Keep this medicine in the original container. Throw away any unused medicine after the expiration date. NOTE: This sheet is a summary. It may not cover all possible information. If you have questions about this medicine, talk to your doctor, pharmacist, or health care provider.    2016, Elsevier/Gold Standard. (2014-11-23  11:40:59)

## 2015-07-11 NOTE — Progress Notes (Signed)
[place] [facility] NOTE  Patient Care Team: Marjean Donna, MD as PCP - General (Family Medicine)  CHIEF COMPLAINTS/PURPOSE OF CONSULTATION:  CLL Bilateral neck masses CT soft tissue neck on 06/14/15 with bulky adenopathy throughout the neck, enlarged parotid LN bilaterally, bilateral axillary adenopathy, mediastinal adenopathy Biopsy of left neck lymph node with Dr. Benjamine Mola on 06/27/15, pathology C/W SLL   HISTORY OF PRESENTING ILLNESS:  Adam Reyes 59 y.o. male is here because of newly diagnosed CLL/SLL.  Mr. Fernandez is accompanied by his wife, Lelon Frohlich, today. He goes by "[name]".   He went to Dr. Everette Rank for a regular physical where he was found to have swollen lymph nodes. After this visit, he went back to work and talked to his wife about this. The following Sunday (6 days later) they spoke with a nurse at church who told them to see Dr. Benjamine Mola about his swollen lymph nodes. Dr. Benjamine Mola went over the CT imaging with the patient and his wife. He then had a biopsy done.   His appetite continues to be good. Denies weight loss, rather weight gain. He is able to eat a large meal without getting full easily. Sleeps well at night without night sweats. Wife notes he has 'hot flashes' where he becomes hot quickly, possibly more hot natured than he used to be. Denies fatigue and does what he needs to do every day. Denies trouble breathing or chest pain. Denies bowel problems, change in bowels, or passing of blood. Denies urinary problems. He denies pain in his swollen lymph nodes.  He has never had a colonoscopy. He has never received a flu shot. He has not had a tonsillectomy.  He is here today for further discussion of CLL, its management, and further recommendations.    MEDICAL HISTORY:  Past Medical History  Diagnosis Date  . Enlarged lymph node     left neck    SURGICAL HISTORY: Past Surgical History  Procedure Laterality Date  . No past surgeries    . Mass  biopsy Left 06/27/2015    Procedure: OPEN LEFT NECK BIOPSY ;  Surgeon: Leta Baptist, MD;  Location: [facility];  Service: ENT;  Laterality: Left;    SOCIAL HISTORY: Social History   Social History  . Marital Status: Married    Spouse Name: N/A  . Number of Children: N/A  . Years of Education: N/A   Occupational History  . Not on file.   Social History Main Topics  . Smoking status: Never Smoker   . Smokeless tobacco: Former Systems developer    Types: Chew, Snuff  . Alcohol Use: Yes     Comment: social  . Drug Use: No  . Sexual Activity: Yes   Other Topics Concern  . Not on file   Social History Narrative  Married 15 years. 5 children. 4 grandchildren. Non Smoker ETOH, occasionally. 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 Enjoys hunting and golf.  FAMILY HISTORY: Family History  Problem Relation Age of Onset  . Diabetes Mother   . Cancer Father   . Cancer Brother    indicated that his mother is alive. He indicated that his father is deceased. He indicated that his brother is deceased.  Mother still living at 49 yo. She does not get around too well but still lives by herself. Mostly healthy. Father died at 71 yo of prostate cancer. 2 brothers and 1 sister living.  1 brother died of esophageal cancer,  he chewed tobacco heavily.  ALLERGIES:  has No Known Allergies.  MEDICATIONS:  Current Outpatient Prescriptions  Medication Sig Dispense Refill  . loratadine (CLARITIN) 10 MG tablet Take 10 mg by mouth daily as needed for allergies.    . Phenylephrine-APAP-Guaifenesin (MUCINEX FAST-MAX COLD & SINUS PO) Take 1 capsule by mouth as needed.    [name] amoxicillin (AMOXIL) 875 MG tablet Take 1 tablet (875 mg total) by mouth 2 (two) times daily. (Patient not taking: Reported on 07/11/2015) 10 tablet 0  . ibuprofen (ADVIL,MOTRIN) 200 MG tablet Take 600 mg by mouth every 6 (six) hours as needed for moderate pain. Reported on 07/11/2015    . oxyCODONE-acetaminophen (ROXICET)  5-325 MG tablet Take 1 tablet by mouth every 4 (four) hours as needed for severe pain. (Patient not taking: Reported on 07/11/2015) 15 tablet 0   No current facility-administered medications for this visit.    Review of Systems  Constitutional: Negative.  Negative for fever, chills, weight loss, malaise/fatigue and diaphoresis.  HENT: Negative.  Negative for congestion, hearing loss, nosebleeds, sore throat and tinnitus.   Eyes: Negative.  Negative for blurred vision, double vision, pain and discharge.  Respiratory: Negative.  Negative for cough, hemoptysis, sputum production, shortness of breath and wheezing.   Cardiovascular: Negative.  Negative for chest pain, palpitations, claudication, leg swelling and PND.  Gastrointestinal: Negative.  Negative for heartburn, nausea, vomiting, abdominal pain, diarrhea, constipation, blood in stool and melena.  Genitourinary: Negative.  Negative for dysuria, urgency, frequency and hematuria.  Musculoskeletal: Negative.  Negative for myalgias, joint pain and falls.  Skin: Negative.  Negative for itching and rash.  Neurological: Negative.  Negative for dizziness, tingling, tremors, sensory change, speech change, focal weakness, seizures, loss of consciousness, weakness and headaches.  Endo/Heme/Allergies: Negative.  Does not bruise/bleed easily.  Psychiatric/Behavioral: Negative.  Negative for depression, suicidal ideas, memory loss and substance abuse. The patient is not nervous/anxious and does not have insomnia.   All other systems reviewed and are negative.  14 point ROS was done and is otherwise as detailed above or in HPI   PHYSICAL EXAMINATION: ECOG PERFORMANCE STATUS: 0 - Asymptomatic  Filed Vitals:   07/11/15 1455  BP: 143/70  Pulse: 81  Temp: 97.7 F (36.5 C)  Resp: 20   Filed Weights   07/11/15 1455  Weight: 270 lb 11.2 oz (122.789 kg)    Physical Exam  Constitutional: He is oriented to person, place, and time and well-developed,  well-nourished, and in no distress.  HENT:  Head: Normocephalic and atraumatic.  Nose: Nose normal.  Mouth/Throat: Oropharynx is clear and moist. No oropharyngeal exudate.  Eyes: Conjunctivae and EOM are normal. Pupils are equal, round, and reactive to light. Right eye exhibits no discharge. Left eye exhibits no discharge. No scleral icterus.  Neck: Normal range of motion. Neck supple. No tracheal deviation present. No thyromegaly present.  Cardiovascular: Normal rate, regular rhythm and normal heart sounds.  Exam reveals no gallop and no friction rub.   No murmur heard. Pulmonary/Chest: Effort normal and breath sounds normal. He has no wheezes. He has no rales.  Abdominal: Soft. Bowel sounds are normal. He exhibits no distension and no mass. There is no tenderness. There is no rebound and no guarding.  Musculoskeletal: Normal range of motion. He exhibits no edema.  Lymphadenopathy:    He has cervical adenopathy.    He has axillary adenopathy.       Right: Inguinal adenopathy present.       Left: Inguinal  adenopathy present.  Right groin the largest lymph node felt is about 4 cm in size. Left groin the largest lymph node felt is about 6 cm in size.   Neurological: He is alert and oriented to person, place, and time. He has normal reflexes. No cranial nerve deficit. Gait normal. Coordination normal.  Skin: Skin is warm and dry. No rash noted.  Psychiatric: Mood, memory, affect and judgment normal.  Nursing note and vitals reviewed.   LABORATORY DATA:  I have reviewed the data as listed No results found for: WBC, HGB, HCT, MCV, PLT CMP  No results found for: NA, K, CL, CO2, GLUCOSE, BUN, CREATININE, CALCIUM, PROT, ALBUMIN, AST, ALT, ALKPHOS, BILITOT, GFRNONAA, GFRAA      RADIOGRAPHIC STUDIES: I have personally reviewed the radiological images as listed and agreed with the findings in the report. CLINICAL DATA: Bilateral neck mass  EXAM: CT NECK WITH  CONTRAST  TECHNIQUE: Multidetector CT imaging of the neck was performed using the standard protocol following the bolus administration of intravenous contrast.  CONTRAST: 72mL OMNIPAQUE IOHEXOL 300 MG/ML SOLN  COMPARISON: None.  FINDINGS: Pharynx and larynx: Normal pharynx. Normal tonsils and larynx.  Salivary glands: Bilateral parotid lesions are present which show solid enhancing density. Given the bulky adenopathy throughout the neck these are most likely parotid lymph nodes which are enlarged.  Thyroid: 6 mm left thyroid nodule. Thyroid normal in size.  Lymph nodes: Bulky adenopathy throughout the neck bilaterally. There are enlarged lymph nodes in the submandibular and submental area. Enlarged level 2, level 3, level 4, and level 5 lymph nodes bilaterally. There is bilateral axillary adenopathy. Adenopathy extends into the superior mediastinum. The lymph nodes have homogeneous solid soft tissue density.  Vascular: Jugular vein patent bilaterally. Carotid artery patent bilaterally.  Limited intracranial: Negative  Visualized orbits: Negative  Mastoids and visualized paranasal sinuses: Mild mucosal edema in the left maxillary sinus. No air-fluid level. Mastoid sinus clear bilaterally.  Skeleton: Negative for fracture. No bony lesion. Mild cervical facet degeneration.  Upper chest: Lung apices are clear. Bilateral axillary adenopathy. Mild mediastinal adenopathy.  IMPRESSION: Bulky adenopathy throughout the neck bilaterally. There also are enlarged parotid lymph nodes bilaterally. There is bilateral axillary adenopathy as well as mediastinal adenopathy. Findings are consistent with lymphoma. Biopsy recommended.  No pharyngeal mass identified.   Electronically Signed  By: Marlan Palau M.D.  On: 06/14/2015 11:12  ASSESSMENT & PLAN:  CLL/SLL Adenopathy, cervical, axillary and mediastinal Asymptomatic  We discussed CLL at length  along with potential treatment routes including imbruvica. We went over the indications for treatment as well as future bone marrow biopsy if treatment is needed. The patient was given a booklet about CLL.  I have ordered a CT scan of the abdomen for completion. He does have extensive adenopathy although currently asymptomatic.  I have referred him for a colonoscopy.  Blood work and flu shot done today.   Mr. Sulkowski will return in 3 weeks for follow up. The patient and his wife were instructed to write down questions regarding his diagnosis and bring to his next follow up visit. We will discuss issues such as prognosis, staging, and indications for treatment at follow up. After his next visit, we will follow up in 3 months.    Orders Placed This Encounter  Procedures  . CT Abdomen Pelvis W Contrast    Standing Status: Future     Number of Occurrences:      Standing Expiration Date: 07/10/2016    Order Specific  Question:  If indicated for the ordered procedure, I authorize the administration of contrast media per Radiology protocol    Answer:  Yes    Order Specific Question:  Reason for Exam (SYMPTOM  OR DIAGNOSIS REQUIRED)    Answer:  CLL/SLL    Order Specific Question:  Preferred imaging location?    Answer:  [facility]  . CBC with Differential  . Comprehensive metabolic panel  . IgG, IgA, IgM  . Immunofixation electrophoresis  . Protein electrophoresis, serum  . Kappa/lambda light chains  . Lactate dehydrogenase  . [id number]    All questions were answered. The patient knows to call the clinic with any problems, questions or concerns.  This note was electronically signed.    This document serves as a record of services personally performed by Ancil Linsey, MD. It was created on her behalf by Arlyce Harman, a trained medical scribe. The creation of this record is based on the scribe's personal observations and the provider's statements to them. This document has been  checked and approved by the attending provider.  I have reviewed the above documentation for accuracy and completeness, and I agree with the above. Molli Hazard, MD  07/11/2015 4:36 PM

## 2015-07-12 LAB — IMMUNOFIXATION ELECTROPHORESIS
IGG (IMMUNOGLOBIN G), SERUM: 962 mg/dL (ref 700–1600)
IGM, SERUM: 37 mg/dL (ref 20–172)
IgA: 74 mg/dL — ABNORMAL LOW (ref 90–386)
Total Protein ELP: 6.5 g/dL (ref 6.0–8.5)

## 2015-07-12 LAB — PROTEIN ELECTROPHORESIS, SERUM
A/G RATIO SPE: 1.4 (ref 0.7–1.7)
ALBUMIN ELP: 3.7 g/dL (ref 2.9–4.4)
Alpha-1-Globulin: 0.3 g/dL (ref 0.0–0.4)
Alpha-2-Globulin: 0.5 g/dL (ref 0.4–1.0)
Beta Globulin: 1 g/dL (ref 0.7–1.3)
GAMMA GLOBULIN: 1 g/dL (ref 0.4–1.8)
Globulin, Total: 2.7 g/dL (ref 2.2–3.9)
M-Spike, %: 0.4 g/dL — ABNORMAL HIGH
TOTAL PROTEIN ELP: 6.4 g/dL (ref 6.0–8.5)

## 2015-07-12 LAB — KAPPA/LAMBDA LIGHT CHAINS
KAPPA FREE LGHT CHN: 13.22 mg/L (ref 3.30–19.40)
Kappa, lambda light chain ratio: 0.27 (ref 0.26–1.65)
Lambda free light chains: 49.38 mg/L — ABNORMAL HIGH (ref 5.71–26.30)

## 2015-07-12 LAB — IGG, IGA, IGM
IGG (IMMUNOGLOBIN G), SERUM: 969 mg/dL (ref 700–1600)
IgA: 75 mg/dL — ABNORMAL LOW (ref 90–386)
IgM, Serum: 36 mg/dL (ref 20–172)

## 2015-07-18 LAB — ZAP-70

## 2015-07-21 ENCOUNTER — Ambulatory Visit (HOSPITAL_COMMUNITY)
Admission: RE | Admit: 2015-07-21 | Discharge: 2015-07-21 | Disposition: A | Discharge status: Home / Self Care | Payer: 59 | Source: Ambulatory Visit | Attending: Hematology & Oncology | Admitting: Hematology & Oncology

## 2015-07-21 DIAGNOSIS — K760 Fatty (change of) liver, not elsewhere classified: Secondary | ICD-10-CM | POA: Insufficient documentation

## 2015-07-21 DIAGNOSIS — I2510 Atherosclerotic heart disease of native coronary artery without angina pectoris: Secondary | ICD-10-CM | POA: Diagnosis not present

## 2015-07-21 DIAGNOSIS — C9110 Chronic lymphocytic leukemia of B-cell type not having achieved remission: Secondary | ICD-10-CM | POA: Insufficient documentation

## 2015-07-21 DIAGNOSIS — R918 Other nonspecific abnormal finding of lung field: Secondary | ICD-10-CM | POA: Insufficient documentation

## 2015-07-21 DIAGNOSIS — K5730 Diverticulosis of large intestine without perforation or abscess without bleeding: Secondary | ICD-10-CM | POA: Insufficient documentation

## 2015-07-21 MED ORDER — IOHEXOL 300 MG/ML  SOLN
100.0000 mL | Freq: Once | INTRAMUSCULAR | Status: AC | PRN
Start: 2015-07-21 — End: 2015-07-21
  Administered 2015-07-21: 100 mL via INTRAVENOUS

## 2015-08-02 ENCOUNTER — Encounter (HOSPITAL_COMMUNITY): Payer: 59 | Attending: Hematology & Oncology | Admitting: Hematology & Oncology

## 2015-08-02 ENCOUNTER — Encounter (HOSPITAL_COMMUNITY): Payer: Self-pay | Admitting: Hematology & Oncology

## 2015-08-02 DIAGNOSIS — R161 Splenomegaly, not elsewhere classified: Secondary | ICD-10-CM

## 2015-08-02 DIAGNOSIS — C9110 Chronic lymphocytic leukemia of B-cell type not having achieved remission: Secondary | ICD-10-CM | POA: Diagnosis not present

## 2015-08-02 DIAGNOSIS — Z23 Encounter for immunization: Secondary | ICD-10-CM | POA: Diagnosis not present

## 2015-08-02 MED ORDER — PNEUMOCOCCAL 13-VAL CONJ VACC IM SUSP
0.5000 mL | Freq: Once | INTRAMUSCULAR | Status: AC
  Administered 2015-08-02: 0.5 mL via INTRAMUSCULAR
  Filled 2015-08-02: qty 0.5

## 2015-08-02 NOTE — Progress Notes (Signed)
[facility] Reyes at [facility] NOTE  Patient Care Team: Adam Donna, MD as PCP - General (Family Medicine)  CHIEF COMPLAINTS/PURPOSE OF CONSULTATION:  CLL/SLL Bilateral neck masses CT soft tissue neck on 06/14/15 with bulky adenopathy throughout the neck, enlarged parotid LN bilaterally, bilateral axillary adenopathy, mediastinal adenopathy Biopsy of left neck lymph node with Adam Reyes on 06/27/15, pathology C/W SLL Monoclonal M spike IgG with kappa light chain specificity, 0.4 gm/dl Low IgA levels 07/21/2015 CT C/A/P with extensive bulky bilateral hilar, mediastinal, retroperitoneal, mesenteric and bilateral pelvic lymphadenopathy and mild splenomegaly  HISTORY OF PRESENTING ILLNESS:  Adam Reyes 60 y.o. male is here because of CLL/SLL.  Adam Reyes is accompanied by his wife, Adam Reyes, today. He goes by "Adam Reyes".   Adam Reyes returns to the [facility] today accompanied by his wife, Adam Reyes.   His wife has jotted a few questions down for today. She asks, "why does this cause his lymph nodes to be enlarged?" The patient and his wife were advised about the nature of CLL and how it can involve both the lymph nodes and the blood.   She wants to know if there are certain foods to eat that can help boost his immune system. He was advised to enjoy life, and eat healthy; everything in moderation, and practical. He says that, when it comes to eating, "I just eat. And eat and eat."  He was advised that he just need to practice common sense when it comes to crowds and handwashing.  Both [name] and his wife are interested in essential oils as well.  His wife reports that they would like a referral to Dr. Olevia Reyes office for his colonoscopy.  His wife says that she thought that the reading material they received last time was helpful, but is open to receiving additional information today. They asked about staging as well as what it means in terms of his lifespan prognosis.      Chronic lymphocytic leukemia (CLL), B-cell ([facility])   06/14/2015 Imaging CT neck- Bulky adenopathy throughout the neck bilaterally. There also are enlarged parotid lymph nodes bilaterally. There is bilateral axillary adenopathy as well as mediastinal adenopathy. Findings are consistent with lymphoma. Biopsy recommended.   06/27/2015 Procedure Left neck lymph node biopsy by Adam Reyes   06/27/2015 Pathology Results Diagnosis Lymph node for lymphoma, Left neck node for lymphoma work up - Adam Reyes.  LOW GRADE.   06/27/2015 Pathology Results Tissue-Flow Cytometry - MONOCLONAL B CELL POPULATION IDENTIFIED. 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 Enlarged lymph node     left neck    SURGICAL HISTORY: Past Surgical History  Procedure Laterality Date  . No past surgeries    . Mass biopsy Left 06/27/2015    Procedure: OPEN LEFT NECK BIOPSY ;  Surgeon: Adam Baptist, MD;  Location: [facility];  Service: ENT;  Laterality: Left;    SOCIAL HISTORY: Social History   Social History  . Marital Status: Married    Spouse Name: N/A  . Number of Children: N/A  . Years of Education: N/A   Occupational History  . Not on file.   Social History Main Topics  . Smoking status: Never Smoker   . Smokeless tobacco: Former Systems developer    Types: Chew, Snuff  . Alcohol Use: Yes  Comment: social  . Drug Use: No  . Sexual Activity: Yes   Other Topics Concern  . Not on file   Social History Narrative  Married 15 years. 5 children. 4 grandchildren. Non Smoker ETOH, occasionally. 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 Enjoys hunting and golf.  FAMILY HISTORY: Family History  Problem Relation Age of Onset  . Diabetes Mother   . Cancer Father   . Cancer Brother    indicated that his mother  is alive. He indicated that his father is deceased. He indicated that his brother is deceased.  Mother still living at 55 yo. She does not get around too well but still lives by herself. Mostly healthy. Father died at 49 yo of prostate cancer. 2 brothers and 1 sister living.  1 brother died of esophageal cancer, he chewed tobacco heavily.  ALLERGIES:  has No Known Allergies.  MEDICATIONS:  Current Outpatient Prescriptions  Medication Sig Dispense Refill  . ibuprofen (ADVIL,MOTRIN) 200 MG tablet Take 600 mg by mouth every 6 (six) hours as needed for moderate pain. Reported on 07/11/2015    . loratadine (CLARITIN) 10 MG tablet Take 10 mg by mouth daily as needed for allergies.    . Phenylephrine-APAP-Guaifenesin (MUCINEX FAST-MAX COLD & SINUS PO) Take 1 capsule by mouth as needed.    [name] oxyCODONE-acetaminophen (ROXICET) 5-325 MG tablet Take 1 tablet by mouth every 4 (four) hours as needed for severe pain. (Patient not taking: Reported on 07/11/2015) 15 tablet 0   No current facility-administered medications for this visit.    Review of Systems  Constitutional: Negative.  Negative for weight loss, malaise/fatigue and diaphoresis.  HENT: Negative.   Eyes: Negative.   Respiratory: Negative.  Negative for shortness of breath.   Cardiovascular: Negative.  Negative for chest pain.  Gastrointestinal: Negative.  Negative for diarrhea, constipation, blood in stool and melena.  Genitourinary: Negative.  Negative for dysuria, urgency, frequency and hematuria.  Musculoskeletal: Negative.   Skin: Negative.   Neurological: Negative.   Endo/Heme/Allergies: Negative.   Psychiatric/Behavioral: Negative.   All other systems reviewed and are negative.  14 point ROS was done and is otherwise as detailed above or in HPI   PHYSICAL EXAMINATION: ECOG PERFORMANCE STATUS: 0 - Asymptomatic  Filed Vitals:   08/02/15 1300  BP: 136/76  Pulse: 94  Temp: 98.3 F (36.8 C)  Resp: 16   Filed Weights    08/02/15 1300  Weight: 265 lb 4.8 oz (120.339 kg)    Physical Exam  Constitutional: He is oriented to person, place, and time and well-developed, well-nourished, and in no distress.  HENT:  Head: Normocephalic and atraumatic.  Nose: Nose normal.  Mouth/Throat: Oropharynx is clear and moist. No oropharyngeal exudate.  Eyes: Conjunctivae and EOM are normal. Pupils are equal, round, and reactive to light. Right eye exhibits no discharge. Left eye exhibits no discharge. No scleral icterus.  Neck: Normal range of motion. Neck supple. No tracheal deviation present. No thyromegaly present.  Bulky adenopathy on the right Two small cervical nodes on the left Submandibular node on the left   Cardiovascular: Normal rate, regular rhythm and normal heart sounds.  Exam reveals no gallop and no friction rub.   No murmur heard. Pulmonary/Chest: Effort normal and breath sounds normal. He has no wheezes. He has no rales.  Abdominal: Soft. Bowel sounds are normal. He exhibits no distension and no mass. There is no tenderness. There is no rebound and no guarding.  Musculoskeletal: Normal range of motion.  He exhibits no edema.  Lymphadenopathy:    He has cervical adenopathy.    He has axillary adenopathy.       Right: Inguinal adenopathy present.       Left: Inguinal adenopathy present.  Right groin the largest lymph node felt is about 4 cm in size. Left groin the largest lymph node felt is about 6 cm in size.  Axilla feels the same Right axillary adenopathy; largest maybe 2cm   Neurological: He is alert and oriented to person, place, and time. He has normal reflexes. No cranial nerve deficit. Gait normal. Coordination normal.  Skin: Skin is warm and dry. No rash noted.  Psychiatric: Mood, memory, affect and judgment normal.  Nursing note and vitals reviewed.   LABORATORY DATA:  I have reviewed the data as listed Lab Results  Component Value Date   WBC 6.8 07/11/2015   HGB 15.1 07/11/2015    HCT 43.2 07/11/2015   MCV 90.6 07/11/2015   PLT 141* 07/11/2015   CMP     Component Value Date/Time   NA 137 07/11/2015 1645   K 3.4* 07/11/2015 1645   CL 105 07/11/2015 1645   CO2 27 07/11/2015 1645   GLUCOSE 104* 07/11/2015 1645   BUN 17 07/11/2015 1645   CREATININE 1.08 07/11/2015 1645   CALCIUM 9.0 07/11/2015 1645   PROT 7.0 07/11/2015 1645   ALBUMIN 4.5 07/11/2015 1645   AST 27 07/11/2015 1645   ALT 32 07/11/2015 1645   ALKPHOS 60 07/11/2015 1645   BILITOT 0.6 07/11/2015 1645   GFRNONAA >60 07/11/2015 1645   GFRAA >60 07/11/2015 1645     RADIOGRAPHIC STUDIES: I have personally reviewed the radiological images as listed and agreed with the findings in the report. No results found.   Study Result     CLINICAL DATA: CLL/ SLL.  EXAM: CT ABDOMEN AND PELVIS WITH CONTRAST  TECHNIQUE: Multidetector CT imaging of the abdomen and pelvis was performed using the standard protocol following bolus administration of intravenous contrast.  CONTRAST: 156m OMNIPAQUE IOHEXOL 300 MG/ML SOLN  COMPARISON: None.  FINDINGS: Lower chest: There are greater than 10 pulmonary nodules scattered throughout both lung bases, largest 1.1 x 0.8 cm in the basilar right upper lobe (series 6/image 12). Coronary atherosclerosis. There are multiple enlarged bilateral hilar lymph nodes, largest 2.4 cm in the right hilum (series 2/ image 7). There is a bulky 2.4 cm subcarinal node (series 2/ image 9). Partially visualized are mildly enlarged bilateral lower paratracheal and aortopulmonary window lymph nodes. There are bulky bilateral paraesophageal and para-aortic lymph nodes in the lower mediastinum, largest 3.0 cm (series 2/ image 31).  Hepatobiliary: Diffuse hepatic steatosis. Solitary hypodense 0.8 cm segment 8 right liver lobe lesion, too small to characterize. Normal gallbladder with no radiopaque cholelithiasis. No biliary ductal dilatation.  Pancreas: Normal, with no  mass or duct dilation.  Spleen: Mild splenomegaly (craniocaudal splenic length 15.7 cm). No discrete splenic mass .  Adrenals/Urinary Tract: Normal adrenals. Simple appearing 4.3 cm renal cyst in the medial upper left kidney. No hydronephrosis. There is mass effect on the bladder by bulky bilateral external iliac lymphadenopathy. Bladder otherwise appears normal.  Stomach/Bowel: Grossly normal stomach. Normal caliber small bowel with no small bowel wall thickening. Normal appendix. Mild-to-moderate scattered diverticulosis in the transverse colon, splenic flexure of the colon and descending colon, with no large bowel wall thickening or pericolonic fat stranding.  Vascular/Lymphatic: Atherosclerotic nonaneurysmal abdominal aorta. Patent portal, splenic, hepatic and renal veins. There is bulky retroperitoneal  lymphadenopathy involving the gastrohepatic ligament, porta hepatis, portacaval, aortocaval and left para-aortic chains. For example, there is a 3.3 cm portacaval node (series 2/ image 51), a 3.2 cm left para-aortic node (series 2/ image 64) and a 2.0 cm aortocaval node (series 2/ image 67). There are multiple mildly enlarged lymph nodes throughout the mesentery, largest 1.5 cm in the right mesentery (series 2/ image 65). There is bulky bilateral common iliac, external iliac and inguinal lymphadenopathy, for example a 5.1 cm right external iliac node (series 2/ image 96) and a 3.0 cm left external iliac node (series 2/ image 96).  Reproductive: Mild prostatomegaly with nonspecific internal prostatic calcifications.  Other: No pneumoperitoneum, ascites or focal fluid collection.  Musculoskeletal: No aggressive appearing focal osseous lesions. Severe degenerative disc disease at L4-5 and L5-S1. 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 There is a 2.3 cm soft tissue density nodule in the posterior subcutaneous right lower chest/upper flank (series 2/  image 52).  IMPRESSION: 1. Extensive bulky bilateral hilar, mediastinal, retroperitoneal, mesenteric and bilateral pelvic lymphadenopathy and mild splenomegaly, in keeping with the provided history of lymphoma. 2. 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 3. Additional findings including mild prostatomegaly, diffuse hepatic steatosis, coronary atherosclerosis and mild to moderate distal colonic diverticulosis.   Electronically Signed  By: Ilona Sorrel M.D.  On: 07/21/2015 15:29     CLINICAL DATA: Bilateral neck mass  EXAM: CT NECK WITH CONTRAST  TECHNIQUE: Multidetector CT imaging of the neck was performed using the standard protocol following the bolus administration of intravenous contrast.  CONTRAST: 4m OMNIPAQUE IOHEXOL 300 MG/ML SOLN  COMPARISON: None.  FINDINGS: Pharynx and larynx: Normal pharynx. Normal tonsils and larynx.  Salivary glands: Bilateral parotid lesions are present which show solid enhancing density. Given the bulky adenopathy throughout the neck these are most likely parotid lymph nodes which are enlarged.  Thyroid: 6 mm left thyroid nodule. Thyroid normal in size.  Lymph nodes: Bulky adenopathy throughout the neck bilaterally. There are enlarged lymph nodes in the submandibular and submental area. Enlarged level 2, level 3, level 4, and level 5 lymph nodes bilaterally. There is bilateral axillary adenopathy. Adenopathy extends into the superior mediastinum. The lymph nodes have homogeneous solid soft tissue density.  Vascular: Jugular vein patent bilaterally. Carotid artery patent bilaterally.  Limited intracranial: Negative  Visualized orbits: Negative  Mastoids and visualized paranasal sinuses: Mild mucosal edema in the left maxillary sinus. No air-fluid level. Mastoid sinus clear bilaterally.  Skeleton: Negative for fracture. No bony lesion. Mild cervical facet degeneration.  Upper  chest: Lung apices are clear. Bilateral axillary adenopathy. Mild mediastinal adenopathy.  IMPRESSION: Bulky adenopathy throughout the neck bilaterally. There also are enlarged parotid lymph nodes bilaterally. There is bilateral axillary adenopathy as well as mediastinal adenopathy. Findings are consistent with lymphoma. Biopsy recommended.  No pharyngeal mass identified.   Electronically Signed  By: CFranchot GalloM.D.  On: 06/14/2015 11:12  ASSESSMENT & PLAN:  CLL/SLL Bilateral neck masses CT soft tissue neck on 06/14/15 with bulky adenopathy throughout the neck, enlarged parotid LN bilaterally, bilateral axillary adenopathy, mediastinal adenopathy Biopsy of left neck lymph node with Dr. TBenjamine Molaon 06/27/15, pathology C/W SLL Monoclonal M spike IgG with kappa light chain specificity, 0.4 gm/dl Low IgA levels 07/21/2015 CT C/A/P with extensive bulky bilateral hilar, mediastinal, retroperitoneal, mesenteric and bilateral pelvic lymphadenopathy and mild Splenomegaly  I discussed the following as detailed from up to date below:  Not all patients with CLL require treatment at  the time of diagnosis. This is principally because: ?CLL is an extremely heterogeneous disease with certain subsets of patients having survival rates without treatment that are similar to the normal population  persons with asymptomatic CLL that displays poor-risk prognostic markers can have increased psychological stress that affects their emotional well-being and creates a push to start therapeutic interventions right away, there is no evidence that earlier treatment benefits the patient. Until studies demonstrate a benefit, early treatment of asymptomatic persons should be reserved for patients enrolled on these clinical trials. We do routinely perform FISH for del17p, del11q, trisomy 12, and del13q in asymptomatic patients at the time of diagnosis to predict time to progression. Treatment of the underlying CLL  is indicated for patients who develop disease-related symptoms or evidence of progressive disease, termed "active disease." With  Therapy is indicated for patients with the following disease-related complications, usually termed "active disease" ?Weakness, night sweats, weight loss, fever, progressive increase in the size of lymph nodes, or pain associated with enlarged lymph nodes.  ?Symptomatic anemia and/or thrombocytopenia (Rai stages III or IV; Binet stage C) Autoimmune mediated anemia or thrombocytopenia is treated with therapy directed at the autoimmune process before treatment of the underlying CLL is initiated.  ?Autoimmune hemolytic anemia and/or thrombocytopenia inadequately responsive to corticosteroid therapy.  ?Progressive disease, as demonstrated by increasing lymphocytosis with a lymphocyte doubling time less than six months, and/or rapidly enlarging lymph nodes, spleen, and liver. In contrast, transient localized lymphadenopathy, occurring in response to localized infections, is not necessarily an indication for initiating treatment. ?Repeated episodes of infection. Hypogammaglobulinemia without repeated episodes of infection is not a clear indication for therapy. (Lymphocytosis itself, even if extreme, is not a strict indication for treatment if patients have no symptoms and adequate bone marrow function. However, in our experience, extreme lymphocytosis (leukocyte counts >200,000/microL) is associated with an increased risk of hyperviscosity syndrome and related complications    He needs prevnar, and then he will get the regular pneumovax a year after that.  I will send a referral to Dr. Karilyn Cota for his colonoscopy.  I will print out some more reading materials for them today. I know of some additional resources that could be helpful.  I will schedule him for follow-up in 3 months, with CBC, CMP, LDH. He knows to let us know if anything comes up in the meantime, or if he comes up  with any questions in the meantime.  I will have them meet [facility] today.  Orders Placed This Encounter  Procedures  . CBC with Differential    Standing Status: Future     Number of Occurrences:      Standing Expiration Date: 08/01/2016  . Comprehensive metabolic panel    Standing Status: Future     Number of Occurrences:      Standing Expiration Date: 08/01/2016  . Lactate dehydrogenase    Standing Status: Future     Number of Occurrences:      Standing Expiration Date: 08/01/2016    All questions were answered. The patient knows to call the clinic with any problems, questions or concerns.  This note was electronically signed.    This document serves as a record of services personally performed by Loma Messing, MD. It was created on her behalf by Peggye Fothergill, a trained medical scribe. The creation of this record is based on the scribe's personal observations and the provider's statements to them. This document has been checked and approved by the attending provider.  I have reviewed  the above documentation for accuracy and completeness, and I agree with the above.  Molli Hazard, MD  08/02/2015 2:50 PM

## 2015-08-02 NOTE — Patient Instructions (Addendum)
[place] at [facility] Discharge Instructions  RECOMMENDATIONS MADE BY THE CONSULTANT AND ANY TEST RESULTS WILL BE SENT TO YOUR REFERRING PHYSICIAN.    Exam and discussion by Dr Whitney Muse today. When you start having symptoms: fatigued, blood (hemoglobin or platelets) drops, swelling and enlarging lymph nodes, night sweats.  Lymphocytes help fight infections, makes antibodies. People with CLL can make abnormal antibodies. When do you get IVIG? when have recurrent infections. Small monoclonal protein in your blood, we will follow it periodically. Be careful around live vaccines (shingles) We will get you set up for prevnar injection.  Referral to Dr Laural Golden for screening colonoscopy.  If you notice any rapid change in any lymph nodes please call us. Return to see the doctor in 3 months with labs.  Please call the clinic if you have any questions or concerns.      Thank you for choosing [place] at [facility] to provide your oncology and hematology care.  To afford each patient quality time with our provider, please arrive at least 15 minutes before your scheduled appointment time.   Beginning January 23rd 2017 lab work for the [facility] will be done in the  Main lab at [facility] on 1st floor. If you have a lab appointment with the [facility] please come in thru the  Main Entrance and check in at the main information desk  You need to re-schedule your appointment should you arrive 10 or more minutes late.  We strive to give you quality time with our providers, and arriving late affects you and other patients whose appointments are after yours.  Also, if you no show three or more times for appointments you may be dismissed from the clinic at the providers discretion.     Again, thank you for choosing [facility].  Our hope is that these requests will decrease the amount of time that you wait before being seen by our  physicians.       _____________________________________________________________  Should you have questions after your visit to [facility], please contact our office at [phone] between the hours of 8:30 a.m. and 4:30 p.m.  Voicemails left after 4:30 p.m. will not be returned until the following business day.  For prescription refill requests, have your pharmacy contact our office.

## 2015-08-04 ENCOUNTER — Encounter (INDEPENDENT_AMBULATORY_CARE_PROVIDER_SITE_OTHER): Payer: Self-pay | Admitting: *Deleted

## 2015-08-10 ENCOUNTER — Other Ambulatory Visit (INDEPENDENT_AMBULATORY_CARE_PROVIDER_SITE_OTHER): Payer: Self-pay | Admitting: *Deleted

## 2015-08-10 DIAGNOSIS — Z1211 Encounter for screening for malignant neoplasm of colon: Secondary | ICD-10-CM

## 2015-08-16 ENCOUNTER — Other Ambulatory Visit (HOSPITAL_COMMUNITY): Payer: Self-pay | Admitting: Emergency Medicine

## 2015-08-16 DIAGNOSIS — C9110 Chronic lymphocytic leukemia of B-cell type not having achieved remission: Secondary | ICD-10-CM

## 2015-08-25 ENCOUNTER — Encounter (INDEPENDENT_AMBULATORY_CARE_PROVIDER_SITE_OTHER): Payer: Self-pay | Admitting: *Deleted

## 2015-08-25 ENCOUNTER — Other Ambulatory Visit (INDEPENDENT_AMBULATORY_CARE_PROVIDER_SITE_OTHER): Payer: Self-pay | Admitting: *Deleted

## 2015-08-25 MED ORDER — PEG 3350-KCL-NA BICARB-NACL 420 G PO SOLR: 4000.0000 mL | Freq: Once | ORAL | Status: DC

## 2015-08-25 NOTE — Telephone Encounter (Signed)
Patient needs trilyte 

## 2015-09-13 ENCOUNTER — Telehealth (INDEPENDENT_AMBULATORY_CARE_PROVIDER_SITE_OTHER): Payer: Self-pay | Admitting: *Deleted

## 2015-09-13 NOTE — Telephone Encounter (Signed)
agree

## 2015-09-13 NOTE — Telephone Encounter (Signed)
Referring MD: penland PCP: no PCP per patient   Procedure: tcs  Reason/Indication:  screening  Has patient had this procedure before?  no  If so, when, by whom and where?    Is there a family history of colon cancer?  no  Who?  What age when diagnosed?    Is patient diabetic?   no      Does patient have prosthetic heart valve or mechanical valve?  no  Do you have a pacemaker?  no  Has patient ever had endocarditis? no  Has patient had joint replacement within last 12 months?  no  Does patient tend to be constipated or take laxatives? no  Does patient have a history of alcohol/drug use?  no  Is patient on Coumadin, Plavix and/or Aspirin? no  Medications: none  Allergies: nkda  Medication Adjustment:   Procedure date & time: 10/11/15 at 930

## 2015-10-11 ENCOUNTER — Encounter (HOSPITAL_COMMUNITY)
Admission: RE | Disposition: A | Discharge status: Home / Self Care | Payer: Self-pay | Source: Ambulatory Visit | Attending: Internal Medicine

## 2015-10-11 ENCOUNTER — Ambulatory Visit (HOSPITAL_COMMUNITY)
Admission: RE | Admit: 2015-10-11 | Discharge: 2015-10-11 | Disposition: A | Discharge status: Home / Self Care | Payer: 59 | Source: Ambulatory Visit | Attending: Internal Medicine | Admitting: Internal Medicine

## 2015-10-11 ENCOUNTER — Encounter (HOSPITAL_COMMUNITY): Payer: Self-pay | Admitting: *Deleted

## 2015-10-11 DIAGNOSIS — K5730 Diverticulosis of large intestine without perforation or abscess without bleeding: Secondary | ICD-10-CM | POA: Insufficient documentation

## 2015-10-11 DIAGNOSIS — D120 Benign neoplasm of cecum: Secondary | ICD-10-CM | POA: Insufficient documentation

## 2015-10-11 DIAGNOSIS — Z720 Tobacco use: Secondary | ICD-10-CM | POA: Insufficient documentation

## 2015-10-11 DIAGNOSIS — D122 Benign neoplasm of ascending colon: Secondary | ICD-10-CM | POA: Insufficient documentation

## 2015-10-11 DIAGNOSIS — D123 Benign neoplasm of transverse colon: Secondary | ICD-10-CM | POA: Diagnosis not present

## 2015-10-11 DIAGNOSIS — Z1211 Encounter for screening for malignant neoplasm of colon: Secondary | ICD-10-CM

## 2015-10-11 DIAGNOSIS — K648 Other hemorrhoids: Secondary | ICD-10-CM | POA: Diagnosis not present

## 2015-10-11 DIAGNOSIS — Z8572 Personal history of non-Hodgkin lymphomas: Secondary | ICD-10-CM | POA: Insufficient documentation

## 2015-10-11 HISTORY — PX: COLONOSCOPY: SHX5424

## 2015-10-11 SURGERY — COLONOSCOPY
Anesthesia: Moderate Sedation

## 2015-10-11 MED ORDER — MIDAZOLAM HCL 5 MG/5ML IJ SOLN
INTRAMUSCULAR | Status: AC
  Filled 2015-10-11: qty 10

## 2015-10-11 MED ORDER — STERILE WATER FOR IRRIGATION IR SOLN
Status: DC | PRN
  Administered 2015-10-11: 10:00:00

## 2015-10-11 MED ORDER — SODIUM CHLORIDE 0.9 % IV SOLN
INTRAVENOUS | Status: DC
  Administered 2015-10-11: 09:00:00 via INTRAVENOUS

## 2015-10-11 MED ORDER — MIDAZOLAM HCL 5 MG/5ML IJ SOLN
INTRAMUSCULAR | Status: DC | PRN
  Administered 2015-10-11 (×5): 2 mg via INTRAVENOUS

## 2015-10-11 MED ORDER — MEPERIDINE HCL 50 MG/ML IJ SOLN
INTRAMUSCULAR | Status: DC | PRN
  Administered 2015-10-11 (×2): 25 mg via INTRAVENOUS

## 2015-10-11 MED ORDER — MEPERIDINE HCL 50 MG/ML IJ SOLN
INTRAMUSCULAR | Status: AC
  Filled 2015-10-11: qty 1

## 2015-10-11 NOTE — Discharge Instructions (Signed)
No aspirin or NSAIDs for 10 days. Resume other medications and high fiber diet. No driving for 24 hours. Physician will call with biopsy results.  Colonoscopy, Care After These instructions give you information on caring for yourself after your procedure. Your doctor may also give you more specific instructions. Call your doctor if you have any problems or questions after your procedure. HOME CARE  Do not drive for 24 hours.  Do not sign important papers or use machinery for 24 hours.  You may shower.  You may go back to your usual activities, but go slower for the first 24 hours.  Take rest breaks often during the first 24 hours.  Walk around or use warm packs on your belly (abdomen) if you have belly cramping or gas.  Drink enough fluids to keep your pee (urine) clear or pale yellow.  Resume your normal diet. Avoid heavy or fried foods.  Avoid drinking alcohol for 24 hours or as told by your doctor.  Only take medicines as told by your doctor. If a tissue sample (biopsy) was taken during the procedure:   Do not take aspirin or blood thinners for 7 days, or as told by your doctor.  Do not drink alcohol for 7 days, or as told by your doctor.  Eat soft foods for the first 24 hours. GET HELP IF: You still have a small amount of blood in your poop (stool) 2-3 days after the procedure. GET HELP RIGHT AWAY IF:  You have more than a small amount of blood in your poop.  You see clumps of tissue (blood clots) in your poop.  Your belly is puffy (swollen).  You feel sick to your stomach (nauseous) or throw up (vomit).  You have a fever.  You have belly pain that gets worse and medicine does not help. MAKE SURE YOU:  Understand these instructions.  Will watch your condition.  Will get help right away if you are not doing well or get worse.   This information is not intended to replace advice given to you by your health care provider. Make sure you discuss any questions  you have with your health care provider.   Document Released: 08/03/2010 Document Revised: 07/06/2013 Document Reviewed: 03/08/2013 Elsevier Interactive Patient Education 2016 Elsevier Inc.   Colon Polyps Polyps are lumps of extra tissue growing inside the body. Polyps can grow in the large intestine (colon). Most colon polyps are noncancerous (benign). However, some colon polyps can become cancerous over time. Polyps that are larger than a pea may be harmful. To be safe, caregivers remove and test all polyps. CAUSES  Polyps form when mutations in the genes cause your cells to grow and divide even though no more tissue is needed. RISK FACTORS There are a number of risk factors that can increase your chances of getting colon polyps. They include:  Being older than 50 years.  Family history of colon polyps or colon cancer.  Long-term colon diseases, such as colitis or Crohn disease.  Being overweight.  Smoking.  Being inactive.  Drinking too much alcohol. SYMPTOMS  Most small polyps do not cause symptoms. If symptoms are present, they may include:  Blood in the stool. The stool may look dark red or black.  Constipation or diarrhea that lasts longer than 1 week. DIAGNOSIS People often do not know they have polyps until their caregiver finds them during a regular checkup. Your caregiver can use 4 tests to check for polyps:  Digital rectal exam. The  caregiver wears gloves and feels inside the rectum. This test would find polyps only in the rectum.  Barium enema. The caregiver puts a liquid called barium into your rectum before taking X-rays of your colon. Barium makes your colon look white. Polyps are dark, so they are easy to see in the X-ray pictures.  Sigmoidoscopy. A thin, flexible tube (sigmoidoscope) is placed into your rectum. The sigmoidoscope has a light and tiny camera in it. The caregiver uses the sigmoidoscope to look at the last third of your colon.  Colonoscopy.  This test is like sigmoidoscopy, but the caregiver looks at the entire colon. This is the most common method for finding and removing polyps. TREATMENT  Any polyps will be removed during a sigmoidoscopy or colonoscopy. The polyps are then tested for cancer. PREVENTION  To help lower your risk of getting more colon polyps:  Eat plenty of fruits and vegetables. Avoid eating fatty foods.  Do not smoke.  Avoid drinking alcohol.  Exercise every day.  Lose weight if recommended by your caregiver.  Eat plenty of calcium and folate. Foods that are rich in calcium include milk, cheese, and broccoli. Foods that are rich in folate include chickpeas, kidney beans, and spinach. HOME CARE INSTRUCTIONS Keep all follow-up appointments as directed by your caregiver. You may need periodic exams to check for polyps. SEEK MEDICAL CARE IF: You notice bleeding during a bowel movement.   This information is not intended to replace advice given to you by your health care provider. Make sure you discuss any questions you have with your health care provider.   Document Released: 03/27/2004 Document Revised: 07/22/2014 Document Reviewed: 09/10/2011 Elsevier Interactive Patient Education 2016 Elsevier Inc.    Moderate Conscious Sedation, Adult, Care After Refer to this sheet in the next few weeks. These instructions provide you with information on caring for yourself after your procedure. Your health care provider may also give you more specific instructions. Your treatment has been planned according to current medical practices, but problems sometimes occur. Call your health care provider if you have any problems or questions after your procedure. WHAT TO EXPECT AFTER THE PROCEDURE  After your procedure:  You may feel sleepy, clumsy, and have poor balance for several hours.  Vomiting may occur if you eat too soon after the procedure. HOME CARE INSTRUCTIONS  Do not participate in any activities where you  could become injured for at least 24 hours. Do not:  Drive.  Swim.  Ride a bicycle.  Operate heavy machinery.  Cook.  Use power tools.  Climb ladders.  Work from a high place.  Do not make important decisions or sign legal documents until you are improved.  If you vomit, drink water, juice, or soup when you can drink without vomiting. Make sure you have little or no nausea before eating solid foods.  Only take over-the-counter or prescription medicines for pain, discomfort, or fever as directed by your health care provider.  Make sure you and your family fully understand everything about the medicines given to you, including what side effects may occur.  You should not drink alcohol, take sleeping pills, or take medicines that cause drowsiness for at least 24 hours.  If you smoke, do not smoke without supervision.  If you are feeling better, you may resume normal activities 24 hours after you were sedated.  Keep all appointments with your health care provider. SEEK MEDICAL CARE IF:  Your skin is pale or bluish in color.  You continue  to feel nauseous or vomit.  Your pain is getting worse and is not helped by medicine.  You have bleeding or swelling.  You are still sleepy or feeling clumsy after 24 hours. SEEK IMMEDIATE MEDICAL CARE IF:  You develop a rash.  You have difficulty breathing.  You develop any type of allergic problem.  You have a fever. MAKE SURE YOU:  Understand these instructions.  Will watch your condition.  Will get help right away if you are not doing well or get worse.   This information is not intended to replace advice given to you by your health care provider. Make sure you discuss any questions you have with your health care provider.   Document Released: 04/21/2013 Document Revised: 07/22/2014 Document Reviewed: 04/21/2013 Elsevier Interactive Patient Education [employer].

## 2015-10-11 NOTE — Op Note (Signed)
[facility] Patient Name: Adam Reyes Procedure Date: 10/11/2015 9:39 AM MRN: [id number] Date of Birth: Feb 25, 1956 Attending MD: Hildred Laser , MD CSN: [id number] Age: 60 Admit Type: Outpatient Procedure:                Colonoscopy Indications:              Screening for colorectal malignant neoplasm Providers:                Hildred Laser, MD, Gwenlyn Fudge, RN, Isabella Stalling, Technician Referring MD:             Molli Hazard, MD Medicines:                Meperidine 50 mg IV, Midazolam 10 mg IV Complications:            No immediate complications. Estimated Blood Loss:     Estimated blood loss was minimal. Procedure:                Pre-Anesthesia Assessment:                           - Prior to the procedure, a History and Physical                            was performed, and patient medications and                            allergies were reviewed. The patient's tolerance of                            previous anesthesia was also reviewed. The risks                            and benefits of the procedure and the sedation                            options and risks were discussed with the patient.                            All questions were answered, and informed consent                            was obtained. Prior Anticoagulants: The patient has                            taken no previous anticoagulant or antiplatelet                            agents. ASA Grade Assessment: II - A patient with                            mild systemic disease. After reviewing the risks  and benefits, the patient was deemed in                            satisfactory condition to undergo the procedure.                           After obtaining informed consent, the colonoscope                            was passed under direct vision. Throughout the                            procedure, the patient's blood pressure, pulse,  and                            oxygen saturations were monitored continuously. The                            [id number]) scope was introduced through                            the anus and advanced to the the cecum, identified                            by appendiceal orifice and ileocecal valve. The                            colonoscopy was somewhat difficult due to a                            tortuous colon. Successful completion of the                            procedure was aided by applying abdominal pressure.                            The patient tolerated the procedure well. The                            quality of the bowel preparation was good. The                            ileocecal valve, appendiceal orifice, and rectum                            were photographed. Scope In: 9:46:28 AM Scope Out: 11:09:37 AM Scope Withdrawal Time: 1 hour 16 minutes 33 seconds  Total Procedure Duration: 1 hour 23 minutes 9 seconds  Findings:      A 4 mm polyp was found in the cecum. The polyp was sessile. The polyp       was removed with a cold snare. Resection and retrieval were complete.       Estimated blood loss was minimal.      A 20 mm polyp was found in the cecum.  The polyp was sessile. The polyp       was removed with a piecemeal technique using a hot snare. Resection and       retrieval were complete. To prevent bleeding after the polypectomy, two       hemostatic clips were successfully placed (MR conditional). There was no       bleeding at the end of the procedure.      A 4 mm polyp was found in the ascending colon. The polyp was sessile.       The polyp was removed with a cold snare. Resection and retrieval were       complete. Estimated blood loss was minimal. To stop active bleeding, one       hemostatic clip was successfully placed (MR conditional). There was no       bleeding at the end of the procedure.      A 10 mm polyp was found in the ascending colon. The  polyp was       semi-sessile. The polyp was removed with a hot snare. Resection and       retrieval were complete. For hemostasis, one hemostatic clip was       successfully placed (MR conditional). There was no bleeding at the end       of the procedure.      A 10 mm polyp was found in the splenic flexure. The polyp was       semi-pedunculated. The polyp was removed with a hot snare. Resection and       retrieval were complete. To stop active bleeding, one hemostatic clip       was successfully placed (MR conditional). There was no bleeding at the       end of the procedure.      A 14 mm polyp was found in the splenic flexure. The polyp was sessile.       The polyp was removed with a hot snare. Resection was complete, and       retrieval was complete. To close a defect after polypectomy, one       hemostatic clip was successfully placed (MR conditional). There was no       bleeding at the end of the procedure.      A few medium-mouthed diverticula were found in the entire colon.      Non-bleeding internal hemorrhoids were found during retroflexion. The       hemorrhoids were medium-sized. Impression:               - One 4 mm polyp in the cecum, removed with a cold                            snare. Resected and retrieved.                           - One 20 mm polyp in the cecum, removed piecemeal                            using a hot snare. Resected and retrieved. Clips                            (MR conditional) were placed.                           -  One 4 mm polyp in the ascending colon, removed                            with a cold snare. Resected and retrieved. Clip (MR                            conditional) was placed.                           - One 10 mm polyp in the ascending colon, removed                            with a hot snare. Resected and retrieved. Clip (MR                            conditional) was placed.                           - One 10 mm polyp at the  splenic flexure, removed                            with a hot snare. Resected and retrieved. Clip (MR                            conditional) was placed.                           - One 14 mm polyp at the splenic flexure, removed                            with a hot snare. Resected and retrieved. Clip (MR                            conditional) was placed.                           - Diverticulosis in the entire examined colon.                           - Non-bleeding internal hemorrhoids. Moderate Sedation:      Moderate (conscious) sedation was administered by the endoscopy nurse       and supervised by the endoscopist. The following parameters were       monitored: oxygen saturation, heart rate, blood pressure, CO2       capnography and response to care. Total physician intraservice time was       89 minutes. Recommendation:           - Patient has a contact number available for                            emergencies. The signs and symptoms of potential                            delayed complications were discussed with the  patient. Return to normal activities tomorrow.                            Written discharge instructions were provided to the                            patient.                           - High fiber diet today.                           - Continue present medications.                           - No aspirin, ibuprofen, naproxen, or other                            non-steroidal anti-inflammatory drugs for 10 days                            after polyp removal.                           - Await pathology results.                           - Repeat colonoscopy for surveillance based on                            pathology results. Procedure Code(s):        --- Professional ---                           [phone], Colonoscopy, flexible; with removal of                            tumor(s), polyp(s), or other lesion(s) by snare                             technique                           99152, Moderate sedation services provided by the                            same physician or other qualified health care                            professional performing the diagnostic or                            therapeutic service that the sedation supports,                            requiring the presence of an independent trained  observer to assist in the monitoring of the                            patient's level of consciousness and physiological                            status; initial 15 minutes of intraservice time,                            patient age 29 years or older                           [phone], Moderate sedation services; each additional                            15 minutes intraservice time                           99153, Moderate sedation services; each additional                            15 minutes intraservice time                           99153, Moderate sedation services; each additional                            15 minutes intraservice time                           99153, Moderate sedation services; each additional                            15 minutes intraservice time                           99153, Moderate sedation services; each additional                            15 minutes intraservice time Diagnosis Code(s):        --- Professional ---                           Z12.11, Encounter for screening for malignant                            neoplasm of colon                           D12.0, Benign neoplasm of cecum                           D12.2, Benign neoplasm of ascending colon                           D12.3, Benign neoplasm of transverse colon (hepatic  flexure or splenic flexure)                           K64.8, Other hemorrhoids                           K57.30, Diverticulosis of large intestine without                             perforation or abscess without bleeding CPT copyright 2016 American Medical Association. All rights reserved. The codes documented in this report are preliminary and upon coder review may  be revised to meet current compliance requirements. Hildred Laser, MD Hildred Laser, MD 10/11/2015 11:34:52 AM This report has been signed electronically. Number of Addenda: 0

## 2015-10-11 NOTE — H&P (Signed)
Adam Reyes is an 60 y.o. male.   Chief Complaint: Patient's here for colonoscopy. HPI: Patient is 60 year old Caucasian male who is here for screening colonoscopy. He denies abdominal pain change in bowel habits or rectal bleeding. This is patient's first exam. 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 He is under care of Dr. Whitney Muse. History is negative for CRC.  Past Medical History  Diagnosis Date  . Enlarged lymph node     left neck  . Cancer [facility])     Lymphoma    Past Surgical History  Procedure Laterality Date  . No past surgeries    . Mass biopsy Left 06/27/2015    Procedure: OPEN LEFT NECK BIOPSY ;  Surgeon: Leta Baptist, MD;  Location: [place];  Service: ENT;  Laterality: Left;    Family History  Problem Relation Age of Onset  . Diabetes Mother   . Cancer Father   . Cancer Brother    Social History:  reports that he has never smoked. He has quit using smokeless tobacco. His smokeless tobacco use included Chew and Snuff. He reports that he drinks alcohol. He reports that he does not use illicit drugs.  Allergies: No Known Allergies  Medications Prior to Admission  Medication Sig Dispense Refill  . loratadine (CLARITIN) 10 MG tablet Take 10 mg by mouth daily as needed for allergies.    . polyethylene glycol-electrolytes (NULYTELY/GOLYTELY) 420 g solution Take 4,000 mLs by mouth once. 4000 mL 0  . ibuprofen (ADVIL,MOTRIN) 200 MG tablet Take 600 mg by mouth every 6 (six) hours as needed for moderate pain. Reported on 07/11/2015    . oxyCODONE-acetaminophen (ROXICET) 5-325 MG tablet Take 1 tablet by mouth every 4 (four) hours as needed for severe pain. (Patient not taking: Reported on 07/11/2015) 15 tablet 0  . Phenylephrine-APAP-Guaifenesin (MUCINEX FAST-MAX COLD & SINUS PO) Take 1 capsule by mouth as needed.      No results found for this or any previous visit (from the past 48 hour(s)). No results found.  ROS  Blood pressure  125/81, pulse 72, temperature 97.4 F (36.3 C), temperature source Oral, resp. rate 14, height 6\' 4"  (1.93 m), weight 260 lb (117.935 kg), SpO2 98 %. Physical Exam  Constitutional: He appears well-developed and well-nourished.  HENT:  Mouth/Throat: Oropharynx is clear and moist.  Eyes: Conjunctivae are normal. No scleral icterus.  Neck: No thyromegaly present.  Cardiovascular: Normal rate, regular rhythm and normal heart sounds.   No murmur heard. Respiratory: Effort normal and breath sounds normal.  GI:  Abdomen is symmetrical and soft. Spleen tip is easily palpable. No hepatomegaly or masses. No tenderness.  Musculoskeletal: He exhibits no edema.  Lymphadenopathy:    He has no cervical adenopathy.  Neurological: He is alert.  Skin: Skin is warm and dry.     Assessment/Plan Average risk screening colonoscopy.  Rogene Houston, MD 10/11/2015, 9:35 AM

## 2015-10-13 ENCOUNTER — Encounter (HOSPITAL_COMMUNITY): Payer: Self-pay | Admitting: Internal Medicine

## 2015-11-02 ENCOUNTER — Encounter (HOSPITAL_COMMUNITY): Payer: 59 | Attending: Hematology & Oncology | Admitting: Hematology & Oncology

## 2015-11-02 ENCOUNTER — Encounter (HOSPITAL_COMMUNITY): Payer: Self-pay | Admitting: Hematology & Oncology

## 2015-11-02 ENCOUNTER — Encounter (HOSPITAL_COMMUNITY): Payer: 59

## 2015-11-02 ENCOUNTER — Other Ambulatory Visit (HOSPITAL_COMMUNITY)
Admission: RE | Admit: 2015-11-02 | Discharge: 2015-11-02 | Disposition: A | Discharge status: Home / Self Care | Payer: 59 | Source: Ambulatory Visit | Attending: Hematology & Oncology | Admitting: Hematology & Oncology

## 2015-11-02 VITALS — BP 149/71 | HR 74 | Temp 97.7°F | Wt 259.0 lb

## 2015-11-02 DIAGNOSIS — R162 Hepatomegaly with splenomegaly, not elsewhere classified: Secondary | ICD-10-CM | POA: Insufficient documentation

## 2015-11-02 DIAGNOSIS — C9110 Chronic lymphocytic leukemia of B-cell type not having achieved remission: Secondary | ICD-10-CM | POA: Diagnosis present

## 2015-11-02 DIAGNOSIS — D696 Thrombocytopenia, unspecified: Secondary | ICD-10-CM

## 2015-11-02 DIAGNOSIS — Z87891 Personal history of nicotine dependence: Secondary | ICD-10-CM | POA: Diagnosis not present

## 2015-11-02 DIAGNOSIS — R590 Localized enlarged lymph nodes: Secondary | ICD-10-CM | POA: Diagnosis not present

## 2015-11-02 LAB — COMPREHENSIVE METABOLIC PANEL
ALBUMIN: 4.4 g/dL (ref 3.5–5.0)
ALT: 23 U/L (ref 17–63)
AST: 24 U/L (ref 15–41)
Alkaline Phosphatase: 47 U/L (ref 38–126)
Anion gap: 8 (ref 5–15)
BUN: 21 mg/dL — ABNORMAL HIGH (ref 6–20)
CHLORIDE: 105 mmol/L (ref 101–111)
CO2: 27 mmol/L (ref 22–32)
CREATININE: 1.13 mg/dL (ref 0.61–1.24)
Calcium: 9.4 mg/dL (ref 8.9–10.3)
GFR calc non Af Amer: >60 mL/min (ref >60)
GLUCOSE: 111 mg/dL — AB (ref 65–99)
Potassium: 3.6 mmol/L (ref 3.5–5.1)
SODIUM: 140 mmol/L (ref 135–145)
Total Bilirubin: 1.1 mg/dL (ref 0.3–1.2)
Total Protein: 7 g/dL (ref 6.5–8.1)

## 2015-11-02 LAB — CBC WITH DIFFERENTIAL/PLATELET
BASOS ABS: 0 10*3/uL (ref 0.0–0.1)
BASOS PCT: 0 %
EOS ABS: 0.1 10*3/uL (ref 0.0–0.7)
EOS PCT: 2 %
HCT: 42.8 % (ref 39.0–52.0)
Hemoglobin: 14.8 g/dL (ref 13.0–17.0)
Lymphocytes Relative: 18 %
Lymphs Abs: 0.8 10*3/uL (ref 0.7–4.0)
MCH: 30.8 pg (ref 26.0–34.0)
MCHC: 34.6 g/dL (ref 30.0–36.0)
MCV: 89.2 fL (ref 78.0–100.0)
Monocytes Absolute: 0.4 10*3/uL (ref 0.1–1.0)
Monocytes Relative: 9 %
NEUTROS PCT: 71 %
Neutro Abs: 3.1 10*3/uL (ref 1.7–7.7)
PLATELETS: 128 10*3/uL — AB (ref 150–400)
RBC: 4.8 MIL/uL (ref 4.22–5.81)
RDW: 13.2 % (ref 11.5–15.5)
WBC: 4.4 10*3/uL (ref 4.0–10.5)

## 2015-11-02 LAB — LACTATE DEHYDROGENASE: LDH: 149 U/L (ref 98–192)

## 2015-11-02 NOTE — Patient Instructions (Addendum)
[place] [facility] Discharge Instructions  RECOMMENDATIONS MADE BY THE CONSULTANT AND ANY TEST RESULTS WILL BE SENT TO YOUR REFERRING PHYSICIAN.   Exam and discussion by Dr Whitney Muse today Labs are stable today, increase you PO intake kidney function just mildly elevated  If you continue to loose weight then please call us, and we can get you an appt to see [place] sooner and examine you. Return to see the doctor in 3 months with labs Please call the clinic if you have any questions or concerns     Thank you for choosing [place] at [facility] to provide your oncology and hematology care.  To afford each patient quality time with our provider, please arrive at least 15 minutes before your scheduled appointment time.   Beginning January 23rd 2017 lab work for the [facility] will be done in the  Main lab at [facility] on 1st floor. If you have a lab appointment with the [facility] please come in thru the  Main Entrance and check in at the main information desk  You need to re-schedule your appointment should you arrive 10 or more minutes late.  We strive to give you quality time with our providers, and arriving late affects you and other patients whose appointments are after yours.  Also, if you no show three or more times for appointments you may be dismissed from the clinic at the providers discretion.     Again, thank you for choosing [facility].  Our hope is that these requests will decrease the amount of time that you wait before being seen by our physicians.       _____________________________________________________________  Should you have questions after your visit to [facility], please contact our office at [phone] between the hours of 8:30 a.m. and 4:30 p.m.  Voicemails left after 4:30 p.m. will not be returned until the following business day.  For prescription refill requests, have your  pharmacy contact our office.         Resources For Cancer Patients and their Caregivers ? American Cancer Society: Can assist with transportation, wigs, general needs, runs Look Good Feel Better.        425 462 1265 ? Cancer Care: Provides financial assistance, online support groups, medication/co-pay assistance.  1-800-813-HOPE 434-634-0812) ? [place] Assists [place] cancer patients and their families through emotional , educational and financial support.  239-495-4574 ? [place] DSS Where to apply for food stamps, Medicaid and utility assistance. 516-782-5068 ? [facility]: Transportation to medical appointments. 249-349-4797 ? Social Security Administration: May apply for disability if have a Stage IV cancer. 423 606 1630 9042332982 ? [employer], Disability and Transit Services: Assists with nutrition, care and transit needs. 786-155-6214 .

## 2015-11-02 NOTE — Progress Notes (Signed)
[facility] [place] NOTE  Patient Care Team: No Pcp Per Patient as PCP - General (General Practice)  CHIEF COMPLAINTS/PURPOSE OF CONSULTATION:  CLL/SLL Bilateral neck masses CT soft tissue neck on 06/14/15 with bulky adenopathy throughout the neck, enlarged parotid LN bilaterally, bilateral axillary adenopathy, mediastinal adenopathy Biopsy of left neck lymph node with Dr. Benjamine Mola on 06/27/15, pathology C/W SLL Monoclonal M spike IgG with kappa light chain specificity, 0.4 gm/dl Low IgA levels 07/21/2015 CT C/A/P with extensive bulky bilateral hilar, mediastinal, retroperitoneal, mesenteric and bilateral pelvic lymphadenopathy and mild splenomegaly Mild Thrombocytopenia  HISTORY OF PRESENTING ILLNESS:  Adam Reyes 60 y.o. male is here for follow-up of CLL/SLL.  Adam Reyes is accompanied by his wife, Lelon Frohlich, today. He goes by "[name]".    He has been feeling good.  He is staying active and been eating well. Doesn't feel overly full when he eats and can eat a full meal.   He had a colonoscopy on 10/11/15. They removed 8 polyps. He stated that he woke up during the middle of the procedure, but notes he had no discomfort.    He has had no drenching night sweats. His wife said that he is hotter than he used to be and that he has hot flashes like she does. They both note that these are mild however and occur any time.    He states that his swelling "in his neck" has gone down.   He said that he doesn't worry about his diagnosis a lot. His wife only worries about his weight loss. On chart review his weight is fairly stable dating back to initial visit in December 2016.   Vitals - 1 value per visit 11/02/2015 10/11/2015 08/02/2015 07/11/2015  Weight (lb) 259 260 265.3 270.7   Vitals - 1 value per visit 06/27/2015  Weight (lb) 264.6       Chronic lymphocytic leukemia (CLL), B-cell ([facility])   06/14/2015 Imaging CT neck- Bulky adenopathy throughout the neck  bilaterally. There also are enlarged parotid lymph nodes bilaterally. There is bilateral axillary adenopathy as well as mediastinal adenopathy. Findings are consistent with lymphoma. Biopsy recommended.   06/27/2015 Procedure Left neck lymph node biopsy by Dr. Benjamine Mola   06/27/2015 Pathology Results Diagnosis Lymph node for lymphoma, Left neck node for lymphoma work up - [place].  LOW GRADE.   06/27/2015 Pathology Results Tissue-Flow Cytometry - MONOCLONAL B CELL POPULATION IDENTIFIED. 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 Enlarged lymph node     left neck  . Cancer [facility])     Lymphoma    SURGICAL HISTORY: Past Surgical History  Procedure Laterality Date  . No past surgeries    . Mass biopsy Left 06/27/2015    Procedure: OPEN LEFT NECK BIOPSY ;  Surgeon: Leta Baptist, MD;  Location: [facility];  Service: ENT;  Laterality: Left;  . Colonoscopy N/A 10/11/2015    Procedure: COLONOSCOPY;  Surgeon: Rogene Houston, MD;  Location[facility];  Service: Endoscopy;  Laterality: N/A;  10/11/2015    SOCIAL HISTORY: Social History   Social History  . Marital Status: Married    Spouse Name: N/A  . Number of Children: N/A  . Years of Education: N/A   Occupational History  . Not on file.   Social  History Main Topics  . Smoking status: Never Smoker   . Smokeless tobacco: Former Systems developer    Types: Chew, Snuff  . Alcohol Use: Yes     Comment: social  . Drug Use: No  . Sexual Activity: Yes   Other Topics Concern  . Not on file   Social History Narrative  Married 15 years. 5 children. 4 grandchildren. Non Smoker ETOH, occasionally. 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 Enjoys hunting and golf.  FAMILY HISTORY: Family History  Problem Relation Age of Onset  . Diabetes  Mother   . Cancer Father   . Cancer Brother    indicated that his mother is alive. He indicated that his father is deceased. He indicated that his brother is deceased.  Mother still living at 59 yo. She does not get around too well but still lives by herself. Mostly healthy. Father died at 61 yo of prostate cancer. 2 brothers and 1 sister living.  1 brother died of esophageal cancer, he chewed tobacco heavily.  ALLERGIES:  has No Known Allergies.  MEDICATIONS:  Current Outpatient Prescriptions  Medication Sig Dispense Refill  . loratadine (CLARITIN) 10 MG tablet Take 10 mg by mouth daily as needed for allergies. Reported on 11/02/2015    . oxyCODONE-acetaminophen (ROXICET) 5-325 MG tablet Take 1 tablet by mouth every 4 (four) hours as needed for severe pain. (Patient not taking: Reported on 07/11/2015) 15 tablet 0  . Phenylephrine-APAP-Guaifenesin (MUCINEX FAST-MAX COLD & SINUS PO) Take 1 capsule by mouth as needed. Reported on 11/02/2015     No current facility-administered medications for this visit.    Review of Systems  Constitutional: Negative.  Negative for weight loss, malaise/fatigue and diaphoresis.       Has hot flashes  HENT: Negative.   Eyes: Negative.   Respiratory: Negative.  Negative for shortness of breath.   Cardiovascular: Negative.  Negative for chest pain.  Gastrointestinal: Negative.  Negative for diarrhea, constipation, blood in stool and melena.  Genitourinary: Negative.  Negative for dysuria, urgency, frequency and hematuria.  Musculoskeletal: Negative.   Skin: Negative.   Neurological: Negative.   Endo/Heme/Allergies: Negative.   Psychiatric/Behavioral: Negative.   All other systems reviewed and are negative.  14 point ROS was done and is otherwise as detailed above or in HPI   PHYSICAL EXAMINATION: ECOG PERFORMANCE STATUS: 0 - Asymptomatic  Filed Vitals:   11/02/15 1300  BP: 149/71  Pulse: 74  Temp: 97.7 F (36.5 C)   Filed Weights    11/02/15 1300  Weight: 259 lb (117.482 kg)    Physical Exam  Constitutional: He is oriented to person, place, and time and well-developed, well-nourished, and in no distress.  HENT:  Head: Normocephalic and atraumatic.  Nose: Nose normal.  Mouth/Throat: Oropharynx is clear and moist. No oropharyngeal exudate.  Eyes: Conjunctivae and EOM are normal. Pupils are equal, round, and reactive to light. Right eye exhibits no discharge. Left eye exhibits no discharge. No scleral icterus.  Neck: Normal range of motion. Neck supple. No tracheal deviation present. No thyromegaly present.  Definite reduction in cervical adenopathy, still present but markedly reduced in size. Less visually obvious as well   Cardiovascular: Normal rate, regular rhythm and normal heart sounds.  Exam reveals no gallop and no friction rub.   No murmur heard. Pulmonary/Chest: Effort normal and breath sounds normal. He has no wheezes. He has no rales.  Abdominal: Soft. Bowel sounds are normal. He exhibits no distension and no mass. There is  no tenderness. There is no rebound and no guarding.  Musculoskeletal: Normal range of motion. He exhibits no edema.  Lymphadenopathy:    He has cervical adenopathy.    He has axillary adenopathy.       Right: Inguinal adenopathy present.       Left: Inguinal adenopathy present.  Right groin the largest lymph node felt is about 4 cm in size. Left groin the largest lymph node felt is about 6 cm in size.  Axilla feels the same Right axillary adenopathy; largest maybe 2cm   Neurological: He is alert and oriented to person, place, and time. He has normal reflexes. No cranial nerve deficit. Gait normal. Coordination normal.  Skin: Skin is warm and dry. No rash noted.  Psychiatric: Mood, memory, affect and judgment normal.  Nursing note and vitals reviewed.   LABORATORY DATA:  I have reviewed the data as listed Lab Results  Component Value Date   WBC 4.4 11/02/2015   HGB 14.8  11/02/2015   HCT 42.8 11/02/2015   MCV 89.2 11/02/2015   PLT 128* 11/02/2015   CMP     Component Value Date/Time   NA 140 11/02/2015 1320   K 3.6 11/02/2015 1320   CL 105 11/02/2015 1320   CO2 27 11/02/2015 1320   GLUCOSE 111* 11/02/2015 1320   BUN 21* 11/02/2015 1320   CREATININE 1.13 11/02/2015 1320   CALCIUM 9.4 11/02/2015 1320   PROT 7.0 11/02/2015 1320   ALBUMIN 4.4 11/02/2015 1320   AST 24 11/02/2015 1320   ALT 23 11/02/2015 1320   ALKPHOS 47 11/02/2015 1320   BILITOT 1.1 11/02/2015 1320   GFRNONAA >60 11/02/2015 1320   GFRAA >60 11/02/2015 1320    RADIOGRAPHIC STUDIES: I have personally reviewed the radiological images as listed and agreed with the findings in the report.   Study Result     CLINICAL DATA: CLL/ SLL.  EXAM: CT ABDOMEN AND PELVIS WITH CONTRAST  TECHNIQUE: Multidetector CT imaging of the abdomen and pelvis was performed using the standard protocol following bolus administration of intravenous contrast.  CONTRAST: 129m OMNIPAQUE IOHEXOL 300 MG/ML SOLN  COMPARISON: None.  FINDINGS: Lower chest: There are greater than 10 pulmonary nodules scattered throughout both lung bases, largest 1.1 x 0.8 cm in the basilar right upper lobe (series 6/image 12). Coronary atherosclerosis. There are multiple enlarged bilateral hilar lymph nodes, largest 2.4 cm in the right hilum (series 2/ image 7). There is a bulky 2.4 cm subcarinal node (series 2/ image 9). Partially visualized are mildly enlarged bilateral lower paratracheal and aortopulmonary window lymph nodes. There are bulky bilateral paraesophageal and para-aortic lymph nodes in the lower mediastinum, largest 3.0 cm (series 2/ image 31).  Hepatobiliary: Diffuse hepatic steatosis. Solitary hypodense 0.8 cm segment 8 right liver lobe lesion, too small to characterize. Normal gallbladder with no radiopaque cholelithiasis. No biliary ductal dilatation.  Pancreas: Normal, with no mass or  duct dilation.  Spleen: Mild splenomegaly (craniocaudal splenic length 15.7 cm). No discrete splenic mass .  Adrenals/Urinary Tract: Normal adrenals. Simple appearing 4.3 cm renal cyst in the medial upper left kidney. No hydronephrosis. There is mass effect on the bladder by bulky bilateral external iliac lymphadenopathy. Bladder otherwise appears normal.  Stomach/Bowel: Grossly normal stomach. Normal caliber small bowel with no small bowel wall thickening. Normal appendix. Mild-to-moderate scattered diverticulosis in the transverse colon, splenic flexure of the colon and descending colon, with no large bowel wall thickening or pericolonic fat stranding.  Vascular/Lymphatic: Atherosclerotic nonaneurysmal abdominal aorta.  Patent portal, splenic, hepatic and renal veins. There is bulky retroperitoneal lymphadenopathy involving the gastrohepatic ligament, porta hepatis, portacaval, aortocaval and left para-aortic chains. For example, there is a 3.3 cm portacaval node (series 2/ image 51), a 3.2 cm left para-aortic node (series 2/ image 64) and a 2.0 cm aortocaval node (series 2/ image 67). There are multiple mildly enlarged lymph nodes throughout the mesentery, largest 1.5 cm in the right mesentery (series 2/ image 65). There is bulky bilateral common iliac, external iliac and inguinal lymphadenopathy, for example a 5.1 cm right external iliac node (series 2/ image 96) and a 3.0 cm left external iliac node (series 2/ image 96).  Reproductive: Mild prostatomegaly with nonspecific internal prostatic calcifications.  Other: No pneumoperitoneum, ascites or focal fluid collection.  Musculoskeletal: No aggressive appearing focal osseous lesions. Severe degenerative disc disease at L4-5 and L5-S1. 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 There is a 2.3 cm soft tissue density nodule in the posterior subcutaneous right lower chest/upper flank (series 2/ image  52).  IMPRESSION: 1. Extensive bulky bilateral hilar, mediastinal, retroperitoneal, mesenteric and bilateral pelvic lymphadenopathy and mild splenomegaly, in keeping with the provided history of lymphoma. 2. 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 3. Additional findings including mild prostatomegaly, diffuse hepatic steatosis, coronary atherosclerosis and mild to moderate distal colonic diverticulosis.   Electronically Signed  By: Ilona Sorrel M.D.  On: 07/21/2015 15:29     CLINICAL DATA: Bilateral neck mass  EXAM: CT NECK WITH CONTRAST  TECHNIQUE: Multidetector CT imaging of the neck was performed using the standard protocol following the bolus administration of intravenous contrast.  CONTRAST: 63m OMNIPAQUE IOHEXOL 300 MG/ML SOLN  COMPARISON: None.  FINDINGS: Pharynx and larynx: Normal pharynx. Normal tonsils and larynx.  Salivary glands: Bilateral parotid lesions are present which show solid enhancing density. Given the bulky adenopathy throughout the neck these are most likely parotid lymph nodes which are enlarged.  Thyroid: 6 mm left thyroid nodule. Thyroid normal in size.  Lymph nodes: Bulky adenopathy throughout the neck bilaterally. There are enlarged lymph nodes in the submandibular and submental area. Enlarged level 2, level 3, level 4, and level 5 lymph nodes bilaterally. There is bilateral axillary adenopathy. Adenopathy extends into the superior mediastinum. The lymph nodes have homogeneous solid soft tissue density.  Vascular: Jugular vein patent bilaterally. Carotid artery patent bilaterally.  Limited intracranial: Negative  Visualized orbits: Negative  Mastoids and visualized paranasal sinuses: Mild mucosal edema in the left maxillary sinus. No air-fluid level. Mastoid sinus clear bilaterally.  Skeleton: Negative for fracture. No bony lesion. Mild cervical facet degeneration.  Upper  chest: Lung apices are clear. Bilateral axillary adenopathy. Mild mediastinal adenopathy.  IMPRESSION: Bulky adenopathy throughout the neck bilaterally. There also are enlarged parotid lymph nodes bilaterally. There is bilateral axillary adenopathy as well as mediastinal adenopathy. Findings are consistent with lymphoma. Biopsy recommended.  No pharyngeal mass identified.   Electronically Signed  By: CFranchot GalloM.D.  On: 06/14/2015 11:12  PATHOLOGY:     ASSESSMENT & PLAN:  CLL/SLL Bilateral neck masses CT soft tissue neck on 06/14/15 with bulky adenopathy throughout the neck, enlarged parotid LN bilaterally, bilateral axillary adenopathy, mediastinal adenopathy Biopsy of left neck lymph node with Dr. TBenjamine Molaon 06/27/15, pathology C/W SLL Monoclonal M spike IgG with kappa light chain specificity, 0.4 gm/dl Low IgA levels 07/21/2015 CT C/A/P with extensive bulky bilateral hilar, mediastinal, retroperitoneal, mesenteric and bilateral pelvic lymphadenopathy and mild Splenomegaly Mild thrombocytopenia  Overall he  is doing well.  His wife is concerned about weight loss. Looking over his weights since December it appears fairly stable.  I have encouraged them to call if he continues to loose.   I again reviewed the following as detailed from up to date below:  Not all patients with CLL require treatment at the time of diagnosis. This is principally because: ?CLL is an extremely heterogeneous disease with certain subsets of patients having survival rates without treatment that are similar to the normal population  persons with asymptomatic CLL that displays poor-risk prognostic markers can have increased psychological stress that affects their emotional well-being and creates a push to start therapeutic interventions right away, there is no evidence that earlier treatment benefits the patient. Until studies demonstrate a benefit, early treatment of asymptomatic persons should be  reserved for patients enrolled on these clinical trials. We do routinely perform FISH for del17p, del11q, trisomy 12, and del13q in asymptomatic patients at the time of diagnosis to predict time to progression. Treatment of the underlying CLL is indicated for patients who develop disease-related symptoms or evidence of progressive disease, termed "active disease." With  Therapy is indicated for patients with the following disease-related complications, usually termed "active disease" ?Weakness, night sweats, weight loss, fever, progressive increase in the size of lymph nodes, or pain associated with enlarged lymph nodes.  ?Symptomatic anemia and/or thrombocytopenia (Rai stages III or IV; Binet stage C) Autoimmune mediated anemia or thrombocytopenia is treated with therapy directed at the autoimmune process before treatment of the underlying CLL is initiated.  ?Autoimmune hemolytic anemia and/or thrombocytopenia inadequately responsive to corticosteroid therapy.  ?Progressive disease, as demonstrated by increasing lymphocytosis with a lymphocyte doubling time less than six months, and/or rapidly enlarging lymph nodes, spleen, and liver. In contrast, transient localized lymphadenopathy, occurring in response to localized infections, is not necessarily an indication for initiating treatment. ?Repeated episodes of infection. Hypogammaglobulinemia without repeated episodes of infection is not a clear indication for therapy. (Lymphocytosis itself, even if extreme, is not a strict indication for treatment if patients have no symptoms and adequate bone marrow function. However, in our experience, extreme lymphocytosis (leukocyte counts >200,000/microL) is associated with an increased risk of hyperviscosity syndrome and related complications   He had a colonoscopy on 10/11/15. They removed 8 polyps.  He will return in 3 months for a follow up.   All questions were answered. The patient knows to call the clinic  with any problems, questions or concerns.  This note was electronically signed.    This document serves as a record of services personally performed by Ancil Linsey, MD. It was created on her behalf by Kandace Blitz, a trained medical scribe. The creation of this record is based on the scribe's personal observations and the provider's statements to them. This document has been checked and approved by the attending provider.  I have reviewed the above documentation for accuracy and completeness, and I agree with the above.  Molli Hazard, MD  11/02/2015 2:30 PM

## 2015-11-03 ENCOUNTER — Encounter: Payer: Self-pay | Admitting: Hematology & Oncology

## 2015-11-05 ENCOUNTER — Encounter (HOSPITAL_COMMUNITY): Payer: Self-pay | Admitting: Hematology & Oncology

## 2015-11-10 LAB — TISSUE HYBRIDIZATION TO NCBH

## 2016-01-31 ENCOUNTER — Other Ambulatory Visit (HOSPITAL_COMMUNITY): Payer: 59

## 2016-01-31 ENCOUNTER — Ambulatory Visit (HOSPITAL_COMMUNITY): Payer: 59 | Admitting: Hematology & Oncology

## 2016-02-09 ENCOUNTER — Encounter (HOSPITAL_COMMUNITY): Payer: 59

## 2016-02-09 ENCOUNTER — Encounter (HOSPITAL_COMMUNITY): Payer: Self-pay | Admitting: Hematology & Oncology

## 2016-02-09 ENCOUNTER — Encounter (HOSPITAL_COMMUNITY): Payer: 59 | Attending: Hematology & Oncology | Admitting: Hematology & Oncology

## 2016-02-09 VITALS — BP 124/69 | HR 69 | Temp 98.3°F | Resp 18 | Wt 266.1 lb

## 2016-02-09 DIAGNOSIS — C9110 Chronic lymphocytic leukemia of B-cell type not having achieved remission: Secondary | ICD-10-CM | POA: Diagnosis not present

## 2016-02-09 DIAGNOSIS — Z86010 Personal history of colonic polyps: Secondary | ICD-10-CM

## 2016-02-09 LAB — COMPREHENSIVE METABOLIC PANEL
ALK PHOS: 52 U/L (ref 38–126)
ALT: 27 U/L (ref 17–63)
ANION GAP: 4 — AB (ref 5–15)
AST: 24 U/L (ref 15–41)
Albumin: 4.5 g/dL (ref 3.5–5.0)
BILIRUBIN TOTAL: 1.3 mg/dL — AB (ref 0.3–1.2)
BUN: 24 mg/dL — AB (ref 6–20)
CO2: 27 mmol/L (ref 22–32)
CREATININE: 1.03 mg/dL (ref 0.61–1.24)
Calcium: 9 mg/dL (ref 8.9–10.3)
Chloride: 108 mmol/L (ref 101–111)
GLUCOSE: 87 mg/dL (ref 65–99)
Potassium: 3.6 mmol/L (ref 3.5–5.1)
Sodium: 139 mmol/L (ref 135–145)
Total Protein: 6.9 g/dL (ref 6.5–8.1)

## 2016-02-09 LAB — CBC WITH DIFFERENTIAL/PLATELET
Basophils Absolute: 0 10*3/uL (ref 0.0–0.1)
Basophils Relative: 1 %
Eosinophils Absolute: 0.1 10*3/uL (ref 0.0–0.7)
Eosinophils Relative: 2 %
HEMATOCRIT: 41.3 % (ref 39.0–52.0)
HEMOGLOBIN: 14.5 g/dL (ref 13.0–17.0)
LYMPHS ABS: 0.7 10*3/uL (ref 0.7–4.0)
Lymphocytes Relative: 17 %
MCH: 31.2 pg (ref 26.0–34.0)
MCHC: 35.1 g/dL (ref 30.0–36.0)
MCV: 88.8 fL (ref 78.0–100.0)
MONO ABS: 0.5 10*3/uL (ref 0.1–1.0)
MONOS PCT: 11 %
NEUTROS ABS: 3 10*3/uL (ref 1.7–7.7)
NEUTROS PCT: 69 %
Platelets: 126 10*3/uL — ABNORMAL LOW (ref 150–400)
RBC: 4.65 MIL/uL (ref 4.22–5.81)
RDW: 13.7 % (ref 11.5–15.5)
WBC: 4.3 10*3/uL (ref 4.0–10.5)

## 2016-02-09 LAB — LACTATE DEHYDROGENASE: LDH: 176 U/L (ref 98–192)

## 2016-02-09 NOTE — Progress Notes (Signed)
[facility] NOTE  Patient Care Team: No Pcp Per Patient as PCP - General (General Practice)  CHIEF COMPLAINTS/PURPOSE OF CONSULTATION:  CLL/SLL Bilateral neck masses CT soft tissue neck on 06/14/15 with bulky adenopathy throughout the neck, enlarged parotid LN bilaterally, bilateral axillary adenopathy, mediastinal adenopathy Biopsy of left neck lymph node with Dr. 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 There also are enlarged parotid lymph nodes bilaterally. There is bilateral axillary adenopathy as well as mediastinal adenopathy. Findings are consistent with lymphoma. Biopsy recommended.      06/27/2015 Procedure    Left neck lymph node biopsy by Dr. Benjamine Mola      06/27/2015 Pathology Results    Diagnosis Lymph node for lymphoma, Left neck node for lymphoma work up - [place].  LOW GRADE.      06/27/2015 Pathology Results    Tissue-Flow Cytometry - MONOCLONAL B CELL POPULATION IDENTIFIED. 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 male is here for follow-up of CLL/SLL.  Adam Reyes is accompanied by his wife, Adam Reyes, today. He goes by "Adam Reyes".    I personally reviewed and went over laboratory studies with the patient and his wife.   He is feeling well, with nothing new or different. He continues to work. He is eating well. His wife was diagnosed with breast  cancer since our last visit.   He denies any new lumps or bumps. He has noticed a little more swelling in his lower abdomen / groin area.   He denies drenching night sweats. He denies sore throat or cough. Appetite remains unchanged.   MEDICAL HISTORY:  Past Medical History:  Diagnosis Date  . Cancer ([place])    Lymphoma  . Enlarged lymph node    left neck    SURGICAL HISTORY: Past Surgical History:  Procedure Laterality Date  . COLONOSCOPY N/A 10/11/2015   Procedure: COLONOSCOPY;  Surgeon: Rogene Houston, MD;  [facility];  Service: Endoscopy;  Laterality: N/A;  10/11/2015  . MASS BIOPSY Left 06/27/2015   Procedure: OPEN LEFT NECK BIOPSY ;  Surgeon: Leta Baptist, MD;  Location: [facility];  Service: ENT;  Laterality: Left;  . NO PAST SURGERIES      SOCIAL HISTORY: Social History   Social History  . Marital status: Married    Spouse name: N/A  . Number of children: N/A  . Years of education: N/A   Occupational History  . Not on file.   Social History Main Topics  . Smoking status: Never Smoker  . Smokeless tobacco: Former Systems developer    Types: Chew, Snuff  . Alcohol use Yes     Comment: social  . Drug use: No  . Sexual activity: Yes   Other Topics Concern  . Not on file   Social History Narrative  . No narrative on file  Married 15 years. 5 children. 4 grandchildren. Non Smoker ETOH, occasionally. 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 Enjoys hunting and golf.  FAMILY HISTORY: Family History  Problem Relation Age of Onset  . Diabetes Mother   . Cancer Father   . Cancer Brother    indicated that his mother is alive. He indicated that his father is deceased. He indicated that his brother is deceased.   Mother still living at 41 yo. She does not get around too well but still lives by herself. Mostly healthy. Father died at 28 yo of prostate cancer. 2 brothers and 1 sister living.  1 brother died of esophageal cancer, he chewed  tobacco heavily.  ALLERGIES:  has No Known Allergies.  MEDICATIONS:  Current Outpatient Prescriptions  Medication Sig Dispense Refill  . loratadine (CLARITIN) 10 MG tablet Take 10 mg by mouth daily as needed for allergies. Reported on 11/02/2015    . oxyCODONE-acetaminophen (ROXICET) 5-325 MG tablet Take 1 tablet by mouth every 4 (four) hours as needed for severe pain. 15 tablet 0  . Phenylephrine-APAP-Guaifenesin (MUCINEX FAST-MAX COLD & SINUS PO) Take 1 capsule by mouth as needed. Reported on 11/02/2015     No current facility-administered medications for this visit.     Review of Systems  Constitutional: Negative.  Negative for diaphoresis, malaise/fatigue and weight loss.  HENT: Negative.   Eyes: Negative.   Respiratory: Negative.  Negative for shortness of breath.   Cardiovascular: Negative.  Negative for chest pain.  Gastrointestinal: Negative.  Negative for blood in stool, constipation, diarrhea and melena.  Genitourinary: Negative.  Negative for dysuria, frequency, hematuria and urgency.  Musculoskeletal: Negative.   Skin: Negative.   Neurological: Negative.   Endo/Heme/Allergies: Negative.   Psychiatric/Behavioral: Negative.   All other systems reviewed and are negative.  14 point ROS was done and is otherwise as detailed above or in HPI   PHYSICAL EXAMINATION: ECOG PERFORMANCE STATUS: 0 - Asymptomatic  Vitals:   02/09/16 1200  BP: 124/69  Pulse: 69  Resp: 18  Temp: 98.3 F (36.8 C)   Filed Weights   02/09/16 1200  Weight: 266 lb 1.6 oz (120.7 kg)    Physical Exam  Constitutional: He is oriented to person, place, and time and well-developed, well-nourished, and in no distress.  HENT:  Head: Normocephalic and atraumatic.  Nose: Nose normal.  Mouth/Throat: Oropharynx is clear and moist. No oropharyngeal exudate.  Eyes: Conjunctivae and EOM are normal. Pupils are equal, round, and reactive to light. Right eye exhibits no discharge. Left eye exhibits no  discharge. No scleral icterus.  Neck: Normal range of motion. Neck supple. No tracheal deviation present. No thyromegaly present.  Definite reduction in cervical adenopathy, still present but markedly reduced in size. Less visually obvious as well  Cardiovascular: Normal rate, regular rhythm and normal heart sounds.  Exam reveals no gallop and no friction rub.   No murmur heard. Pulmonary/Chest: Effort normal and breath sounds normal. He has no wheezes. He has no rales.  Abdominal: Soft. Bowel sounds are normal. He exhibits no distension and no mass. There is no tenderness. There is no rebound and no guarding.  Musculoskeletal: Normal range of motion. He exhibits no edema.  Lymphadenopathy:    He has cervical adenopathy.    He has axillary adenopathy.       Right: Inguinal adenopathy present.       Left: Inguinal adenopathy present.  Right groin the largest lymph node felt is about 4 cm in size. Left groin the largest lymph node felt is about 6 cm in size.  Axilla feels the same  Right axillary adenopathy; largest maybe 2cm   Neurological: He is alert and oriented to person, place, and time. He has normal reflexes. No cranial nerve deficit. Gait normal. Coordination normal.  Skin: Skin is warm and dry. No rash noted.  Psychiatric: Mood, memory, affect and judgment normal.  Nursing note and vitals reviewed.   LABORATORY DATA:  I have reviewed the data as listed Lab Results  Component Value Date   WBC 4.3 02/09/2016   HGB 14.5 02/09/2016   HCT 41.3 02/09/2016   MCV 88.8 02/09/2016   PLT 126 (L) 02/09/2016   CMP     Component Value Date/Time   NA 139 02/09/2016 1226   K 3.6 02/09/2016 1226   CL 108 02/09/2016 1226   CO2 27 02/09/2016 1226   GLUCOSE 87 02/09/2016 1226   BUN 24 (H) 02/09/2016 1226   CREATININE 1.03 02/09/2016 1226   CALCIUM 9.0 02/09/2016 1226   PROT 6.9 02/09/2016 1226   ALBUMIN 4.5 02/09/2016 1226   AST 24 02/09/2016 1226   ALT 27 02/09/2016 1226    ALKPHOS 52 02/09/2016 1226   BILITOT 1.3 (H) 02/09/2016 1226   GFRNONAA >60 02/09/2016 1226   GFRAA >60 02/09/2016 1226    RADIOGRAPHIC STUDIES: I have personally reviewed the radiological images as listed and agreed with the findings in the report.  Study Result     CLINICAL DATA: CLL/ SLL.  EXAM: CT ABDOMEN AND PELVIS WITH CONTRAST  TECHNIQUE: Multidetector CT imaging of the abdomen and pelvis was performed using the standard protocol following bolus administration of intravenous contrast.  CONTRAST: OMNIPAQUE IOHEXOL 300 MG/ML SOLN  COMPARISON: None.  FINDINGS: Lower chest: There are greater than 10 pulmonary nodules scattered throughout both lung bases, largest 1.1 x 0.8 cm in the basilar right upper lobe (series 6/image 12). Coronary atherosclerosis. There are multiple enlarged bilateral hilar lymph nodes, largest 2.4 cm in the right hilum (series 2/ image 7). There is a bulky 2.4 cm subcarinal node (series 2/ image 9). Partially visualized are mildly enlarged bilateral lower paratracheal and aortopulmonary window lymph nodes. There are bulky bilateral paraesophageal and para-aortic lymph nodes in the lower mediastinum, largest 3.0 cm (series 2/ image 31).  Hepatobiliary: Diffuse hepatic steatosis. Solitary hypodense 0.8 cm segment 8 right liver lobe lesion, too small to characterize. Normal gallbladder with no radiopaque cholelithiasis. No biliary ductal dilatation.  Pancreas: Normal, with no mass or duct dilation.  Spleen: Mild splenomegaly (craniocaudal splenic length 15.7 cm). No discrete splenic mass .  Adrenals/Urinary Tract: Normal adrenals. Simple appearing 4.3 cm renal cyst in the medial upper left kidney. No hydronephrosis. There is mass effect on the bladder by bulky bilateral external iliac lymphadenopathy. Bladder otherwise appears normal.  Stomach/Bowel: Grossly normal stomach. Normal caliber small bowel with no small bowel  wall thickening. Normal appendix. Mild-to-moderate scattered diverticulosis in the transverse colon, splenic flexure of the colon and descending colon, with no large bowel wall thickening or pericolonic fat stranding.  Vascular/Lymphatic: Atherosclerotic nonaneurysmal abdominal aorta. Patent portal, splenic, hepatic and renal veins. There is bulky retroperitoneal lymphadenopathy involving the gastrohepatic ligament, porta hepatis, portacaval, aortocaval and left para-aortic chains. For example, there is a 3.3 cm portacaval node (series 2/ image 51), a 3.2 cm left para-aortic node (series 2/ image 64) and a 2.0 cm aortocaval node (series 2/ image 67). There are multiple mildly enlarged lymph nodes throughout the mesentery, largest 1.5 cm in the right mesentery (series 2/ image 65). There is bulky bilateral common iliac, external  iliac and inguinal lymphadenopathy, for example a 5.1 cm right external iliac node (series 2/ image 96) and a 3.0 cm left external iliac node (series 2/ image 96).  Reproductive: Mild prostatomegaly with nonspecific internal prostatic calcifications.  Other: No pneumoperitoneum, ascites or focal fluid collection.  Musculoskeletal: No aggressive appearing focal osseous lesions. Severe degenerative disc disease at L4-5 and L5-S1. 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 There is a 2.3 cm soft tissue density nodule in the posterior subcutaneous right lower chest/upper flank (series 2/ image 52).  IMPRESSION: 1. Extensive bulky bilateral hilar, mediastinal, retroperitoneal, mesenteric and bilateral pelvic lymphadenopathy and mild splenomegaly, in keeping with the provided history of lymphoma. 2. 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 3. Additional findings including mild prostatomegaly, diffuse hepatic steatosis, coronary atherosclerosis and mild to moderate distal colonic  diverticulosis.   Electronically Signed  By: Delbert Phenix M.D.  On: 07/21/2015 15:29     PATHOLOGY:        ASSESSMENT & PLAN:  CLL/SLL Bilateral neck masses CT soft tissue neck on 06/14/15 with bulky adenopathy throughout the neck, enlarged parotid LN bilaterally, bilateral axillary adenopathy, mediastinal adenopathy Biopsy of left neck lymph node with Dr. Suszanne Conners on 06/27/15, pathology C/W SLL Monoclonal M spike IgG with kappa light chain specificity, 0.4 gm/dl Low IgA levels 07/17/4304 CT C/A/P with extensive bulky bilateral hilar, mediastinal, retroperitoneal, mesenteric and bilateral pelvic lymphadenopathy and mild Splenomegaly Mild thrombocytopenia  Overall he is doing well.  No B symptoms. No new or progressively enlarging adenopathy. He remains active. Appetite is unchanged. I again reviewed the following as detailed from up to date below:  Not all patients with CLL require treatment at the time of diagnosis. This is principally because: ?CLL is an extremely heterogeneous disease with certain subsets of patients having survival rates without treatment that are similar to the normal population  persons with asymptomatic CLL that displays poor-risk prognostic markers can have increased psychological stress that affects their emotional well-being and creates a push to start therapeutic interventions right away, there is no evidence that earlier treatment benefits the patient. Until studies demonstrate a benefit, early treatment of asymptomatic persons should be reserved for patients enrolled on these clinical trials. We do routinely perform FISH for del17p, del11q, trisomy 12, and del13q in asymptomatic patients at the time of diagnosis to predict time to progression. Treatment of the underlying CLL is indicated for patients who develop disease-related symptoms or evidence of progressive disease, termed "active disease." With  Therapy is indicated for patients with the  following disease-related complications, usually termed "active disease" ?Weakness, night sweats, weight loss, fever, progressive increase in the size of lymph nodes, or pain associated with enlarged lymph nodes.  ?Symptomatic anemia and/or thrombocytopenia (Rai stages III or IV; Binet stage C) Autoimmune mediated anemia or thrombocytopenia is treated with therapy directed at the autoimmune process before treatment of the underlying CLL is initiated.  ?Autoimmune hemolytic anemia and/or thrombocytopenia inadequately responsive to corticosteroid therapy.  ?Progressive disease, as demonstrated by increasing lymphocytosis with a lymphocyte doubling time less than six months, and/or rapidly enlarging lymph nodes, spleen, and liver. In contrast, transient localized lymphadenopathy, occurring in response to localized infections, is not necessarily an indication for initiating treatment. ?Repeated episodes of infection. Hypogammaglobulinemia without repeated episodes of infection is not a clear indication for therapy. (Lymphocytosis itself, even if extreme, is not a strict indication for treatment if patients have no symptoms and adequate bone marrow function. However, in our  experience, extreme lymphocytosis (leukocyte counts >200,000/microL) is associated with an increased risk of hyperviscosity syndrome and related complications   He had a colonoscopy on 10/11/15. They removed 8 polyps.  Labs reviewed. We will repeat CMP in a few weeks to reevaluate bilirubin.  I will call pathology to let them know his peripheral FISH results have not been scanned into EPIC. I advised the patient that ultimately prior to treatment (if indicated) a BMBX is performed and FISH is performed on bone marrow which is most reliable. Currently no need for BMBX.   He will return for follow up in 4 months with standing labs.   All questions were answered. The patient knows to call the clinic with any problems, questions or  concerns.  This note was electronically signed.    This document serves as a record of services personally performed by Ancil Linsey, MD. It was created on her behalf by Arlyce Harman, a trained medical scribe. The creation of this record is based on the scribe's personal observations and the provider's statements to them. This document has been checked and approved by the attending provider.  I have reviewed the above documentation for accuracy and completeness, and I agree with the above.  Molli Hazard, MD  02/09/2016 1:15 PM

## 2016-02-09 NOTE — Patient Instructions (Addendum)
[place] at [facility] Discharge Instructions  RECOMMENDATIONS MADE BY THE CONSULTANT AND ANY TEST RESULTS WILL BE SENT TO YOUR REFERRING PHYSICIAN.  Return  to see Dr. Whitney Muse in 4 months with labs   Thank you for choosing [place] at [facility] to provide your oncology and hematology care.  To afford each patient quality time with our provider, please arrive at least 15 minutes before your scheduled appointment time.   Beginning January 23rd 2017 lab work for the [facility] will be done in the  Main lab at [facility] on 1st floor. If you have a lab appointment with the [facility] please come in thru the  Main Entrance and check in at the main information desk  You need to re-schedule your appointment should you arrive 10 or more minutes late.  We strive to give you quality time with our providers, and arriving late affects you and other patients whose appointments are after yours.  Also, if you no show three or more times for appointments you may be dismissed from the clinic at the providers discretion.     Again, thank you for choosing [facility].  Our hope is that these requests will decrease the amount of time that you wait before being seen by our physicians.       _____________________________________________________________  Should you have questions after your visit to [facility], please contact our office at [phone] between the hours of 8:30 a.m. and 4:30 p.m.  Voicemails left after 4:30 p.m. will not be returned until the following business day.  For prescription refill requests, have your pharmacy contact our office.         Resources For Cancer Patients and their Caregivers ? American Cancer Society: Can assist with transportation, wigs, general needs, runs Look Good Feel Better.        629-084-2253 ? Cancer Care: Provides financial assistance, online support groups,  medication/co-pay assistance.  1-800-813-HOPE 331-034-4046) ? [place] Assists [place] cancer patients and their families through emotional , educational and financial support.  223 329 8071 ? [place] DSS Where to apply for food stamps, Medicaid and utility assistance. (437) 802-5899 ? RCATS: Transportation to medical appointments. 570-187-5076 ? Social Security Administration: May apply for disability if have a Stage IV cancer. 270-587-0375 917-654-7403 ? [employer], Disability and Transit Services: Assists with nutrition, care and transit needs. [place] Support Programs: @10RELATIVEDAYS @ > Cancer Support Group  2nd Tuesday of the month 1pm-2pm, Journey Room  > Creative Journey  3rd Tuesday of the month 1130am-1pm, Journey Room  > Look Good Feel Better  1st Wednesday of the month 10am-12 noon, Journey Room (Call [place] to register [phone])

## 2016-02-22 ENCOUNTER — Other Ambulatory Visit (HOSPITAL_COMMUNITY): Payer: Self-pay

## 2016-02-22 DIAGNOSIS — C9110 Chronic lymphocytic leukemia of B-cell type not having achieved remission: Secondary | ICD-10-CM

## 2016-02-23 ENCOUNTER — Encounter (HOSPITAL_COMMUNITY): Payer: 59 | Attending: Hematology & Oncology

## 2016-02-23 DIAGNOSIS — C9110 Chronic lymphocytic leukemia of B-cell type not having achieved remission: Secondary | ICD-10-CM | POA: Diagnosis present

## 2016-02-23 LAB — COMPREHENSIVE METABOLIC PANEL
ALT: 24 U/L (ref 17–63)
ANION GAP: 7 (ref 5–15)
AST: 26 U/L (ref 15–41)
Albumin: 4.7 g/dL (ref 3.5–5.0)
Alkaline Phosphatase: 54 U/L (ref 38–126)
BILIRUBIN TOTAL: 1.3 mg/dL — AB (ref 0.3–1.2)
BUN: 21 mg/dL — AB (ref 6–20)
CO2: 25 mmol/L (ref 22–32)
Calcium: 9.1 mg/dL (ref 8.9–10.3)
Chloride: 107 mmol/L (ref 101–111)
Creatinine, Ser: 1 mg/dL (ref 0.61–1.24)
GLUCOSE: 88 mg/dL (ref 65–99)
POTASSIUM: 3.6 mmol/L (ref 3.5–5.1)
Sodium: 139 mmol/L (ref 135–145)
TOTAL PROTEIN: 7.2 g/dL (ref 6.5–8.1)

## 2016-02-23 LAB — CBC WITH DIFFERENTIAL/PLATELET
BASOS ABS: 0 10*3/uL (ref 0.0–0.1)
Basophils Relative: 0 %
EOS PCT: 2 %
Eosinophils Absolute: 0.1 10*3/uL (ref 0.0–0.7)
HEMATOCRIT: 41.9 % (ref 39.0–52.0)
HEMOGLOBIN: 14.7 g/dL (ref 13.0–17.0)
LYMPHS PCT: 15 %
Lymphs Abs: 0.8 10*3/uL (ref 0.7–4.0)
MCH: 30.8 pg (ref 26.0–34.0)
MCHC: 35.1 g/dL (ref 30.0–36.0)
MCV: 87.8 fL (ref 78.0–100.0)
MONO ABS: 0.7 10*3/uL (ref 0.1–1.0)
MONOS PCT: 13 %
Neutro Abs: 3.7 10*3/uL (ref 1.7–7.7)
Neutrophils Relative %: 70 %
Platelets: 137 10*3/uL — ABNORMAL LOW (ref 150–400)
RBC: 4.77 MIL/uL (ref 4.22–5.81)
RDW: 13.6 % (ref 11.5–15.5)
WBC: 5.2 10*3/uL (ref 4.0–10.5)

## 2016-02-23 LAB — LACTATE DEHYDROGENASE: LDH: 202 U/L — ABNORMAL HIGH (ref 98–192)

## 2016-03-02 ENCOUNTER — Encounter (HOSPITAL_COMMUNITY): Payer: Self-pay | Admitting: Hematology & Oncology

## 2016-06-11 ENCOUNTER — Encounter (HOSPITAL_COMMUNITY): Payer: 59 | Attending: Hematology & Oncology | Admitting: Hematology & Oncology

## 2016-06-11 ENCOUNTER — Encounter (HOSPITAL_COMMUNITY): Payer: 59

## 2016-06-11 ENCOUNTER — Encounter (HOSPITAL_COMMUNITY): Payer: Self-pay | Admitting: Hematology & Oncology

## 2016-06-11 VITALS — BP 141/82 | HR 78 | Temp 97.7°F | Resp 18 | Wt 271.4 lb

## 2016-06-11 DIAGNOSIS — D472 Monoclonal gammopathy: Secondary | ICD-10-CM | POA: Diagnosis not present

## 2016-06-11 DIAGNOSIS — R591 Generalized enlarged lymph nodes: Secondary | ICD-10-CM

## 2016-06-11 DIAGNOSIS — D696 Thrombocytopenia, unspecified: Secondary | ICD-10-CM

## 2016-06-11 DIAGNOSIS — R599 Enlarged lymph nodes, unspecified: Secondary | ICD-10-CM

## 2016-06-11 DIAGNOSIS — C9110 Chronic lymphocytic leukemia of B-cell type not having achieved remission: Secondary | ICD-10-CM

## 2016-06-11 DIAGNOSIS — R161 Splenomegaly, not elsewhere classified: Secondary | ICD-10-CM

## 2016-06-11 LAB — CBC WITH DIFFERENTIAL/PLATELET
BASOS PCT: 0 %
Basophils Absolute: 0 10*3/uL (ref 0.0–0.1)
EOS PCT: 1 %
Eosinophils Absolute: 0.1 10*3/uL (ref 0.0–0.7)
HEMATOCRIT: 44.5 % (ref 39.0–52.0)
Hemoglobin: 15.7 g/dL (ref 13.0–17.0)
Lymphocytes Relative: 11 %
Lymphs Abs: 0.7 10*3/uL (ref 0.7–4.0)
MCH: 31.4 pg (ref 26.0–34.0)
MCHC: 35.3 g/dL (ref 30.0–36.0)
MCV: 89 fL (ref 78.0–100.0)
MONO ABS: 0.7 10*3/uL (ref 0.1–1.0)
MONOS PCT: 12 %
NEUTROS ABS: 4.7 10*3/uL (ref 1.7–7.7)
Neutrophils Relative %: 76 %
PLATELETS: 128 10*3/uL — AB (ref 150–400)
RBC: 5 MIL/uL (ref 4.22–5.81)
RDW: 13 % (ref 11.5–15.5)
WBC: 6.2 10*3/uL (ref 4.0–10.5)

## 2016-06-11 LAB — COMPREHENSIVE METABOLIC PANEL
ALK PHOS: 46 U/L (ref 38–126)
ALT: 26 U/L (ref 17–63)
ANION GAP: 8 (ref 5–15)
AST: 25 U/L (ref 15–41)
Albumin: 4.6 g/dL (ref 3.5–5.0)
BILIRUBIN TOTAL: 1.1 mg/dL (ref 0.3–1.2)
BUN: 26 mg/dL — ABNORMAL HIGH (ref 6–20)
CALCIUM: 9.2 mg/dL (ref 8.9–10.3)
CO2: 25 mmol/L (ref 22–32)
Chloride: 101 mmol/L (ref 101–111)
Creatinine, Ser: 1.1 mg/dL (ref 0.61–1.24)
GFR calc non Af Amer: >60 mL/min (ref >60)
Glucose, Bld: 87 mg/dL (ref 65–99)
Potassium: 3.8 mmol/L (ref 3.5–5.1)
SODIUM: 134 mmol/L — AB (ref 135–145)
TOTAL PROTEIN: 7.2 g/dL (ref 6.5–8.1)

## 2016-06-11 LAB — LACTATE DEHYDROGENASE: LDH: 189 U/L (ref 98–192)

## 2016-06-11 NOTE — Patient Instructions (Signed)
[place] at [facility] Discharge Instructions  RECOMMENDATIONS MADE BY THE CONSULTANT AND ANY TEST RESULTS WILL BE SENT TO YOUR REFERRING PHYSICIAN.  Exam with Dr. Miliani Deike Muse. Return to the clinic in 6 months for a doctor's appointment as well as labs. Labs today as you leave.    Thank you for choosing [place] at [facility] to provide your oncology and hematology care.  To afford each patient quality time with our provider, please arrive at least 15 minutes before your scheduled appointment time.   Beginning January 23rd 2017 lab work for the [facility] will be done in the  Main lab at [facility] on 1st floor. If you have a lab appointment with the [facility] please come in thru the  Main Entrance and check in at the main information desk  You need to re-schedule your appointment should you arrive 10 or more minutes late.  We strive to give you quality time with our providers, and arriving late affects you and other patients whose appointments are after yours.  Also, if you no show three or more times for appointments you may be dismissed from the clinic at the providers discretion.     Again, thank you for choosing [facility].  Our hope is that these requests will decrease the amount of time that you wait before being seen by our physicians.       _____________________________________________________________  Should you have questions after your visit to [facility], please contact our office at [phone] between the hours of 8:30 a.m. and 4:30 p.m.  Voicemails left after 4:30 p.m. will not be returned until the following business day.  For prescription refill requests, have your pharmacy contact our office.         Resources For Cancer Patients and their Caregivers ? American Cancer Society: Can assist with transportation, wigs, general needs, runs Look Good Feel Better.         302-222-4269 ? Cancer Care: Provides financial assistance, online support groups, medication/co-pay assistance.  1-800-813-HOPE (424)064-6100) ? [place] Assists [place] cancer patients and their families through emotional , educational and financial support.  (226) 170-8135 ? [place] DSS Where to apply for food stamps, Medicaid and utility assistance. 479-119-3072 ? RCATS: Transportation to medical appointments. (918)234-9886 ? Social Security Administration: May apply for disability if have a Stage IV cancer. (781)460-5548 445 876 9933 ? [employer], Disability and Transit Services: Assists with nutrition, care and transit needs. [place] Support Programs: @10RELATIVEDAYS @ > Cancer Support Group  2nd Tuesday of the month 1pm-2pm, Journey Room  > Creative Journey  3rd Tuesday of the month 1130am-1pm, Journey Room  > Look Good Feel Better  1st Wednesday of the month 10am-12 noon, Journey Room (Call [place] to register [phone])

## 2016-06-11 NOTE — Progress Notes (Signed)
[facility] NOTE  Patient Care Team: No Pcp Per Patient as PCP - General (General Practice)  CHIEF COMPLAINTS/PURPOSE OF CONSULTATION:  CLL/SLL Bilateral neck masses CT soft tissue neck on 06/14/15 with bulky adenopathy throughout the neck, enlarged parotid LN bilaterally, bilateral axillary adenopathy, mediastinal adenopathy Biopsy of left neck lymph node with Dr. Benjamine Mola on 06/27/15, pathology C/W SLL Monoclonal M spike IgG with kappa light chain specificity, 0.4 gm/dl Low IgA levels 07/21/2015 CT C/A/P with extensive bulky bilateral hilar, mediastinal, retroperitoneal, mesenteric and bilateral pelvic lymphadenopathy and mild splenomegaly  HISTORY OF PRESENTING ILLNESS:  Adam Reyes 60 y.o. male is here because of CLL/SLL.  He is accompanied by his wife today. Patient has gained some weight over the holidays. He feels well and has been busy working. Denies fatigue, fever, abdominal pain, chest pain, or dyspnea.  Patient has no complaints.  No new adenopathy. No night sweats. Appetite is excellent.     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 There also are enlarged parotid lymph nodes bilaterally. There is bilateral axillary adenopathy as well as mediastinal adenopathy. Findings are consistent with lymphoma. Biopsy recommended.      06/27/2015 Procedure    Left neck lymph node biopsy by Dr. Benjamine Mola      06/27/2015 Pathology Results    Diagnosis Lymph node for lymphoma, Left neck node for lymphoma work up - [place].  LOW GRADE.      06/27/2015 Pathology Results    Tissue-Flow Cytometry - MONOCLONAL B CELL POPULATION IDENTIFIED. 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 Cancer ([place])    Lymphoma  . Enlarged lymph node    left neck    SURGICAL HISTORY: Past Surgical History:  Procedure Laterality Date  . COLONOSCOPY N/A 10/11/2015   Procedure: COLONOSCOPY;  Surgeon: Rogene Houston, MD;  Location: [facility];  Service: Endoscopy;  Laterality: N/A;  10/11/2015  . MASS BIOPSY Left 06/27/2015   Procedure: OPEN LEFT NECK BIOPSY ;  Surgeon: Leta Baptist, MD;  Location: [facility];  Service: ENT;  Laterality: Left;  . NO PAST SURGERIES      SOCIAL HISTORY: Social History   Social History  . Marital status: Married    Spouse name: N/A  . Number of children: N/A  . Years of education: N/A   Occupational History  . Not on file.   Social History Main Topics  . Smoking status: Never Smoker  . Smokeless tobacco: Former Systems developer    Types: Chew, Snuff  . Alcohol use Yes     Comment: social  . Drug use: No  . Sexual activity: Yes   Other Topics Concern  . Not on file   Social History Narrative  . No narrative on file  Married 15 years. 5 children. 4 grandchildren. Non Smoker ETOH, occasionally. 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 Enjoys hunting and golf.  FAMILY HISTORY: Family History  Problem Relation Age of Onset  . Diabetes Mother   . Cancer Father   . Cancer Brother    indicated that his mother is alive. He indicated that his father is deceased. He indicated that his brother is deceased.   Mother  still living at 57 yo. She does not get around too well but still lives by herself. Mostly healthy. Father died at 69 yo of prostate cancer. 2 brothers and 1 sister living.  1 brother died of esophageal cancer, he chewed tobacco heavily.  ALLERGIES:  has No Known Allergies.  MEDICATIONS:  Current Outpatient Prescriptions  Medication Sig Dispense Refill  . loratadine (CLARITIN) 10 MG tablet Take 10 mg by mouth daily as needed for allergies. Reported on 11/02/2015    .  oxyCODONE-acetaminophen (ROXICET) 5-325 MG tablet Take 1 tablet by mouth every 4 (four) hours as needed for severe pain. 15 tablet 0  . Phenylephrine-APAP-Guaifenesin (MUCINEX FAST-MAX COLD & SINUS PO) Take 1 capsule by mouth as needed. Reported on 11/02/2015     No current facility-administered medications for this visit.     Review of Systems  Constitutional: Negative.  Negative for diaphoresis, malaise/fatigue and weight loss.  HENT: Negative.   Eyes: Negative.   Respiratory: Negative.  Negative for shortness of breath.   Cardiovascular: Negative.  Negative for chest pain.  Gastrointestinal: Negative.  Negative for blood in stool, constipation, diarrhea and melena.  Genitourinary: Negative.  Negative for dysuria, frequency, hematuria and urgency.  Musculoskeletal: Negative.   Skin: Negative.   Neurological: Negative.   Endo/Heme/Allergies: Negative.   Psychiatric/Behavioral: Negative.   All other systems reviewed and are negative.  14 point ROS was done and is otherwise as detailed above or in HPI   PHYSICAL EXAMINATION: ECOG PERFORMANCE STATUS: 0 - Asymptomatic  Vitals:   06/11/16 1412  BP: (!) 141/82  Pulse: 78  Resp: 18  Temp: 97.7 F (36.5 C)   Filed Weights   06/11/16 1412  Weight: 271 lb 6.4 oz (123.1 kg)    Physical Exam  Constitutional: He is oriented to person, place, and time and well-developed, well-nourished, and in no distress.  HENT:  Head: Normocephalic and atraumatic.  Nose: Nose normal.  Mouth/Throat: Oropharynx is clear and moist. No oropharyngeal exudate.  Eyes: Conjunctivae and EOM are normal. Pupils are equal, round, and reactive to light. Right eye exhibits no discharge. Left eye exhibits no discharge. No scleral icterus.  Neck: Normal range of motion. Neck supple. No tracheal deviation present. No thyromegaly present.  Bulky adenopathy on the right Two small cervical nodes on the left Submandibular node on the left   Cardiovascular: Normal  rate, regular rhythm and normal heart sounds.  Exam reveals no gallop and no friction rub.   No murmur heard. Pulmonary/Chest: Effort normal and breath sounds normal. He has no wheezes. He has no rales.  Abdominal: Soft. Bowel sounds are normal. He exhibits no distension and no mass. There is no tenderness. There is no rebound and no guarding.  Musculoskeletal: Normal range of motion. He exhibits no edema.  Lymphadenopathy:    He has cervical adenopathy.    He has axillary adenopathy.       Right: Inguinal adenopathy present.       Left: Inguinal adenopathy present.  Right groin the largest lymph node felt is about 4 cm in size. Left groin the largest lymph node felt is about 6 cm in size.  Axilla feels the same Right axillary adenopathy; largest maybe 2cm   Neurological: He is alert and oriented to person, place, and time. He has normal reflexes. No cranial nerve deficit. Gait normal. Coordination normal.  Skin: Skin is warm and dry. No rash noted.  Psychiatric: Mood, memory, affect and judgment normal.  Nursing note and vitals  reviewed.   LABORATORY DATA:  I have reviewed the data as listed Lab Results  Component Value Date   WBC 6.2 06/11/2016   HGB 15.7 06/11/2016   HCT 44.5 06/11/2016   MCV 89.0 06/11/2016   PLT 128 (L) 06/11/2016   CMP     Component Value Date/Time   NA 134 (L) 06/11/2016 1455   K 3.8 06/11/2016 1455   CL 101 06/11/2016 1455   CO2 25 06/11/2016 1455   GLUCOSE 87 06/11/2016 1455   BUN 26 (H) 06/11/2016 1455   CREATININE 1.10 06/11/2016 1455   CALCIUM 9.2 06/11/2016 1455   PROT 7.2 06/11/2016 1455   ALBUMIN 4.6 06/11/2016 1455   AST 25 06/11/2016 1455   ALT 26 06/11/2016 1455   ALKPHOS 46 06/11/2016 1455   BILITOT 1.1 06/11/2016 1455   GFRNONAA >60 06/11/2016 1455   GFRAA >60 06/11/2016 1455     RADIOGRAPHIC STUDIES: I have personally reviewed the radiological images as listed and agreed with the findings in the report. No results found.    Study Result     CLINICAL DATA: CLL/ SLL.  EXAM: CT ABDOMEN AND PELVIS WITH CONTRAST  TECHNIQUE: Multidetector CT imaging of the abdomen and pelvis was performed using the standard protocol following bolus administration of intravenous contrast.  CONTRAST: 129m OMNIPAQUE IOHEXOL 300 MG/ML SOLN  COMPARISON: None.  FINDINGS: Lower chest: There are greater than 10 pulmonary nodules scattered throughout both lung bases, largest 1.1 x 0.8 cm in the basilar right upper lobe (series 6/image 12). Coronary atherosclerosis. There are multiple enlarged bilateral hilar lymph nodes, largest 2.4 cm in the right hilum (series 2/ image 7). There is a bulky 2.4 cm subcarinal node (series 2/ image 9). Partially visualized are mildly enlarged bilateral lower paratracheal and aortopulmonary window lymph nodes. There are bulky bilateral paraesophageal and para-aortic lymph nodes in the lower mediastinum, largest 3.0 cm (series 2/ image 31).  Hepatobiliary: Diffuse hepatic steatosis. Solitary hypodense 0.8 cm segment 8 right liver lobe lesion, too small to characterize. Normal gallbladder with no radiopaque cholelithiasis. No biliary ductal dilatation.  Pancreas: Normal, with no mass or duct dilation.  Spleen: Mild splenomegaly (craniocaudal splenic length 15.7 cm). No discrete splenic mass .  Adrenals/Urinary Tract: Normal adrenals. Simple appearing 4.3 cm renal cyst in the medial upper left kidney. No hydronephrosis. There is mass effect on the bladder by bulky bilateral external iliac lymphadenopathy. Bladder otherwise appears normal.  Stomach/Bowel: Grossly normal stomach. Normal caliber small bowel with no small bowel wall thickening. Normal appendix. Mild-to-moderate scattered diverticulosis in the transverse colon, splenic flexure of the colon and descending colon, with no large bowel wall thickening or pericolonic fat stranding.  Vascular/Lymphatic:  Atherosclerotic nonaneurysmal abdominal aorta. Patent portal, splenic, hepatic and renal veins. There is bulky retroperitoneal lymphadenopathy involving the gastrohepatic ligament, porta hepatis, portacaval, aortocaval and left para-aortic chains. For example, there is a 3.3 cm portacaval node (series 2/ image 51), a 3.2 cm left para-aortic node (series 2/ image 64) and a 2.0 cm aortocaval node (series 2/ image 67). There are multiple mildly enlarged lymph nodes throughout the mesentery, largest 1.5 cm in the right mesentery (series 2/ image 65). There is bulky bilateral common iliac, external iliac and inguinal lymphadenopathy, for example a 5.1 cm right external iliac node (series 2/ image 96) and a 3.0 cm left external iliac node (series 2/ image 96).  Reproductive: Mild prostatomegaly with nonspecific internal prostatic calcifications.  Other: No pneumoperitoneum, ascites or focal fluid collection.  Musculoskeletal:  No aggressive appearing focal osseous lesions. Severe degenerative disc disease at L4-5 and L5-S1. 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 There is a 2.3 cm soft tissue density nodule in the posterior subcutaneous right lower chest/upper flank (series 2/ image 52).  IMPRESSION: 1. Extensive bulky bilateral hilar, mediastinal, retroperitoneal, mesenteric and bilateral pelvic lymphadenopathy and mild splenomegaly, in keeping with the provided history of lymphoma. 2. 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 3. Additional findings including mild prostatomegaly, diffuse hepatic steatosis, coronary atherosclerosis and mild to moderate distal colonic diverticulosis.   Electronically Signed  By: Ilona Sorrel M.D.  On: 07/21/2015 15:29     CLINICAL DATA: Bilateral neck mass  EXAM: CT NECK WITH CONTRAST  TECHNIQUE: Multidetector CT imaging of the neck was performed using the standard protocol following the  bolus administration of intravenous contrast.  CONTRAST: 61m OMNIPAQUE IOHEXOL 300 MG/ML SOLN  COMPARISON: None.  FINDINGS: Pharynx and larynx: Normal pharynx. Normal tonsils and larynx.  Salivary glands: Bilateral parotid lesions are present which show solid enhancing density. Given the bulky adenopathy throughout the neck these are most likely parotid lymph nodes which are enlarged.  Thyroid: 6 mm left thyroid nodule. Thyroid normal in size.  Lymph nodes: Bulky adenopathy throughout the neck bilaterally. There are enlarged lymph nodes in the submandibular and submental area. Enlarged level 2, level 3, level 4, and level 5 lymph nodes bilaterally. There is bilateral axillary adenopathy. Adenopathy extends into the superior mediastinum. The lymph nodes have homogeneous solid soft tissue density.  Vascular: Jugular vein patent bilaterally. Carotid artery patent bilaterally.  Limited intracranial: Negative  Visualized orbits: Negative  Mastoids and visualized paranasal sinuses: Mild mucosal edema in the left maxillary sinus. No air-fluid level. Mastoid sinus clear bilaterally.  Skeleton: Negative for fracture. No bony lesion. Mild cervical facet degeneration.  Upper chest: Lung apices are clear. Bilateral axillary adenopathy. Mild mediastinal adenopathy.  IMPRESSION: Bulky adenopathy throughout the neck bilaterally. There also are enlarged parotid lymph nodes bilaterally. There is bilateral axillary adenopathy as well as mediastinal adenopathy. Findings are consistent with lymphoma. Biopsy recommended.  No pharyngeal mass identified.   Electronically Signed  By: CFranchot GalloM.D.  On: 06/14/2015 11:12  ASSESSMENT & PLAN:  CLL/SLL Bilateral neck masses CT soft tissue neck on 06/14/15 with bulky adenopathy throughout the neck, enlarged parotid LN bilaterally, bilateral axillary adenopathy, mediastinal adenopathy Biopsy of left neck lymph  node with Dr. TBenjamine Molaon 06/27/15, pathology C/W SLL Monoclonal M spike IgG with kappa light chain specificity, 0.4 gm/dl Low IgA levels 07/21/2015 CT C/A/P with extensive bulky bilateral hilar, mediastinal, retroperitoneal, mesenteric and bilateral pelvic lymphadenopathy and mild Splenomegaly   He is doing well. No signs/symptoms to suggest need for therapy. Thrombocytopenia is mild. Weight is stable to up. Will space out visits to 6 months intervals with labs.    I discussed the following as detailed from up to date below:  Not all patients with CLL require treatment at the time of diagnosis. This is principally because: ?CLL is an extremely heterogeneous disease with certain subsets of patients having survival rates without treatment that are similar to the normal population  persons with asymptomatic CLL that displays poor-risk prognostic markers can have increased psychological stress that affects their emotional well-being and creates a push to start therapeutic interventions right away, there is no evidence that earlier treatment benefits the patient. Until studies demonstrate a benefit, early treatment of asymptomatic persons should be reserved for patients enrolled on these clinical  trials. We do routinely perform FISH for del17p, del11q, trisomy 12, and del13q in asymptomatic patients at the time of diagnosis to predict time to progression. Treatment of the underlying CLL is indicated for patients who develop disease-related symptoms or evidence of progressive disease, termed "active disease." With  Therapy is indicated for patients with the following disease-related complications, usually termed "active disease" ?Weakness, night sweats, weight loss, fever, progressive increase in the size of lymph nodes, or pain associated with enlarged lymph nodes.  ?Symptomatic anemia and/or thrombocytopenia (Rai stages III or IV; Binet stage C) Autoimmune mediated anemia or thrombocytopenia is treated  with therapy directed at the autoimmune process before treatment of the underlying CLL is initiated.  ?Autoimmune hemolytic anemia and/or thrombocytopenia inadequately responsive to corticosteroid therapy.  ?Progressive disease, as demonstrated by increasing lymphocytosis with a lymphocyte doubling time less than six months, and/or rapidly enlarging lymph nodes, spleen, and liver. In contrast, transient localized lymphadenopathy, occurring in response to localized infections, is not necessarily an indication for initiating treatment. ?Repeated episodes of infection. Hypogammaglobulinemia without repeated episodes of infection is not a clear indication for therapy. (Lymphocytosis itself, even if extreme, is not a strict indication for treatment if patients have no symptoms and adequate bone marrow function. However, in our experience, extreme lymphocytosis (leukocyte counts >200,000/microL) is associated with an increased risk of hyperviscosity syndrome and related complications   Orders Placed This Encounter  Procedures  . CBC with Differential    Standing Status:   Future    Standing Expiration Date:   06/11/2017  . Comprehensive metabolic panel    Standing Status:   Future    Standing Expiration Date:   06/11/2017  . Lactate dehydrogenase    Standing Status:   Future    Standing Expiration Date:   06/11/2017   All questions were answered. The patient knows to call the clinic with any problems, questions or concerns.  This note was electronically signed.    This document serves as a record of services personally performed by Ancil Linsey, MD. It was created on her behalf by Elmyra Ricks, a trained medical scribe. The creation of this record is based on the scribe's personal observations and the provider's statements to them. This document has been checked and approved by the attending provider.  I have reviewed the above documentation for accuracy and completeness, and I agree with the  above.  Molli Hazard, MD  06/12/2016 5:31 PM

## 2016-06-12 ENCOUNTER — Encounter (HOSPITAL_COMMUNITY): Payer: Self-pay | Admitting: Hematology & Oncology

## 2016-12-12 ENCOUNTER — Encounter (HOSPITAL_COMMUNITY): Payer: Self-pay | Admitting: Adult Health

## 2016-12-12 ENCOUNTER — Encounter (HOSPITAL_COMMUNITY): Payer: 59 | Attending: Oncology

## 2016-12-12 ENCOUNTER — Ambulatory Visit (HOSPITAL_COMMUNITY): Payer: 59 | Admitting: Adult Health

## 2016-12-12 ENCOUNTER — Encounter (HOSPITAL_BASED_OUTPATIENT_CLINIC_OR_DEPARTMENT_OTHER): Payer: 59 | Admitting: Adult Health

## 2016-12-12 ENCOUNTER — Other Ambulatory Visit (HOSPITAL_COMMUNITY): Payer: 59

## 2016-12-12 VITALS — BP 139/75 | HR 76 | Temp 98.0°F | Resp 18 | Wt 275.6 lb

## 2016-12-12 DIAGNOSIS — D696 Thrombocytopenia, unspecified: Secondary | ICD-10-CM | POA: Diagnosis not present

## 2016-12-12 DIAGNOSIS — Z833 Family history of diabetes mellitus: Secondary | ICD-10-CM | POA: Diagnosis not present

## 2016-12-12 DIAGNOSIS — Z809 Family history of malignant neoplasm, unspecified: Secondary | ICD-10-CM | POA: Insufficient documentation

## 2016-12-12 DIAGNOSIS — Z9889 Other specified postprocedural states: Secondary | ICD-10-CM | POA: Diagnosis not present

## 2016-12-12 DIAGNOSIS — R918 Other nonspecific abnormal finding of lung field: Secondary | ICD-10-CM | POA: Diagnosis not present

## 2016-12-12 DIAGNOSIS — R591 Generalized enlarged lymph nodes: Secondary | ICD-10-CM

## 2016-12-12 DIAGNOSIS — C9110 Chronic lymphocytic leukemia of B-cell type not having achieved remission: Secondary | ICD-10-CM | POA: Insufficient documentation

## 2016-12-12 DIAGNOSIS — R599 Enlarged lymph nodes, unspecified: Secondary | ICD-10-CM

## 2016-12-12 LAB — CBC WITH DIFFERENTIAL/PLATELET
Basophils Absolute: 0 10*3/uL (ref 0.0–0.1)
Basophils Relative: 0 %
Eosinophils Absolute: 0.1 10*3/uL (ref 0.0–0.7)
Eosinophils Relative: 2 %
HCT: 44 % (ref 39.0–52.0)
Hemoglobin: 15.4 g/dL (ref 13.0–17.0)
Lymphocytes Relative: 15 %
Lymphs Abs: 0.7 10*3/uL (ref 0.7–4.0)
MCH: 31.1 pg (ref 26.0–34.0)
MCHC: 35 g/dL (ref 30.0–36.0)
MCV: 88.9 fL (ref 78.0–100.0)
Monocytes Absolute: 0.4 10*3/uL (ref 0.1–1.0)
Monocytes Relative: 9 %
Neutro Abs: 3.3 10*3/uL (ref 1.7–7.7)
Neutrophils Relative %: 73 %
Platelets: 119 10*3/uL — ABNORMAL LOW (ref 150–400)
RBC: 4.95 MIL/uL (ref 4.22–5.81)
RDW: 13.1 % (ref 11.5–15.5)
WBC: 4.4 10*3/uL (ref 4.0–10.5)

## 2016-12-12 LAB — COMPREHENSIVE METABOLIC PANEL
ALT: 30 U/L (ref 17–63)
AST: 26 U/L (ref 15–41)
Albumin: 4.5 g/dL (ref 3.5–5.0)
Alkaline Phosphatase: 48 U/L (ref 38–126)
Anion gap: 9 (ref 5–15)
BUN: 20 mg/dL (ref 6–20)
CO2: 26 mmol/L (ref 22–32)
Calcium: 9.1 mg/dL (ref 8.9–10.3)
Chloride: 106 mmol/L (ref 101–111)
Creatinine, Ser: 1.02 mg/dL (ref 0.61–1.24)
GFR calc Af Amer: >60 mL/min (ref >60)
GFR calc non Af Amer: >60 mL/min (ref >60)
Glucose, Bld: 133 mg/dL — ABNORMAL HIGH (ref 65–99)
Potassium: 3.6 mmol/L (ref 3.5–5.1)
Sodium: 141 mmol/L (ref 135–145)
Total Bilirubin: 1.3 mg/dL — ABNORMAL HIGH (ref 0.3–1.2)
Total Protein: 7 g/dL (ref 6.5–8.1)

## 2016-12-12 LAB — LACTATE DEHYDROGENASE: LDH: 190 U/L (ref 98–192)

## 2016-12-12 NOTE — Progress Notes (Signed)
[address], 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 There also are enlarged parotid lymph nodes bilaterally. There is bilateral axillary adenopathy as well as mediastinal adenopathy. Findings are consistent with lymphoma. Biopsy recommended.      06/27/2015 Procedure    Left neck lymph node biopsy by Dr. Benjamine Mola      06/27/2015 Pathology Results    Diagnosis Lymph node for lymphoma, Left neck node for lymphoma work up - [place].  LOW GRADE.      06/27/2015 Pathology Results    Tissue-Flow Cytometry - MONOCLONAL B CELL POPULATION IDENTIFIED. 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 Donald Pore last note on 06/11/16)     INTERVAL HISTORY:  Mr. Kuyper 61 y.o. male returns for follow-up of B-cell CLL/SLL.   He is here today with his wife. Overall, he tells me that he feels great. His appetite and energy levels are both 100%. Denies any pain. He continues to work full-time.    Denies any new/worsening lymphadenopathy that he has noticed. "It's just my normal swelling in my neck and my groin." Denies night sweats, fevers, weight loss, or bleeding episodes. Denies blood in his stools, dark/tarry stools, or hematuria.    He does not have a PCP who he sees regularly.  His previous PCP retired and he has not established care with a new provider. 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 He did have colonoscopy in 2017 as  well.   Otherwise, he is largely without complaints today.    REVIEW OF SYSTEMS:  Review of Systems  Constitutional: Negative.  Negative for chills, fatigue and fever.  HENT:   Positive for lump/mass. Negative for nosebleeds.   Eyes: Negative.   Respiratory: Negative.  Negative for cough and shortness of breath.   Cardiovascular: Negative.  Negative for chest pain and leg swelling.  Gastrointestinal: Negative.  Negative for abdominal pain, blood in stool, constipation, diarrhea, nausea and vomiting.  Endocrine: Negative.   Genitourinary: Negative.  Negative for dysuria and hematuria.   Musculoskeletal: Negative.  Negative for arthralgias.  Skin: Negative.  Negative for rash.  Neurological: Negative.  Negative for dizziness and headaches.  Hematological: Positive for adenopathy. Does not bruise/bleed easily.  Psychiatric/Behavioral: Negative.  Negative for depression and sleep disturbance. The patient is not nervous/anxious.      PAST MEDICAL/SURGICAL HISTORY:  Past Medical History:  Diagnosis Date  . Cancer ([place])    Lymphoma  . Enlarged lymph node    left neck   Past Surgical History:  Procedure Laterality Date  . COLONOSCOPY N/A 10/11/2015   Procedure: COLONOSCOPY;  Surgeon: Rogene Houston, MD;  [facility];  Service: Endoscopy;  Laterality: N/A;  10/11/2015  . MASS BIOPSY Left 06/27/2015   Procedure: OPEN LEFT NECK BIOPSY ;  Surgeon: Leta Baptist, MD;  Location: [facility];  Service: ENT;  Laterality: Left;  . NO PAST  SURGERIES       SOCIAL HISTORY:  Social History   Social History  . Marital status: Married    Spouse name: N/A  . Number of children: N/A  . Years of education: N/A   Occupational History  . Not on file.   Social History Main Topics  . Smoking status: Never Smoker  . Smokeless tobacco: Former Systems developer    Types: Chew, Snuff  . Alcohol use Yes     Comment: social  . Drug use: No  . Sexual activity: Yes   Other Topics  Concern  . Not on file   Social History Narrative  . No narrative on file    FAMILY HISTORY:  Family History  Problem Relation Age of Onset  . Diabetes Mother   . Cancer Father   . Cancer Brother     CURRENT MEDICATIONS:  Outpatient Encounter Prescriptions as of 12/12/2016  Medication Sig  . Ascorbic Acid (VITAMIN C PO) Take 1 tablet by mouth daily.  . [DISCONTINUED] loratadine (CLARITIN) 10 MG tablet Take 10 mg by mouth daily as needed for allergies. Reported on 11/02/2015  . [DISCONTINUED] oxyCODONE-acetaminophen (ROXICET) 5-325 MG tablet Take 1 tablet by mouth every 4 (four) hours as needed for severe pain. (Patient not taking: Reported on 12/12/2016)  . [DISCONTINUED] Phenylephrine-APAP-Guaifenesin ([place] FAST-MAX COLD & SINUS PO) Take 1 capsule by mouth as needed. Reported on 11/02/2015   No facility-administered encounter medications on file as of 12/12/2016.     ALLERGIES:  No Known Allergies   PHYSICAL EXAM:  ECOG Performance status: 0 - Asymptomatic   Vitals:   12/12/16 1251  BP: 139/75  Pulse: 76  Resp: 18  Temp: 98 F (36.7 C)   Filed Weights   12/12/16 1251  Weight: 275 lb 9.6 oz (125 kg)    Physical Exam  Constitutional: He is oriented to person, place, and time and well-developed, well-nourished, and in no distress.  HENT:  Head: Normocephalic.    Mouth/Throat: Oropharynx is clear and moist. No oropharyngeal exudate.  Eyes: Conjunctivae are normal. Pupils are equal, round, and reactive to light. No scleral icterus.  Neck: Normal range of motion.  Cardiovascular: Normal rate, regular rhythm and normal heart sounds.   Pulmonary/Chest: Effort normal and breath sounds normal. No respiratory distress. He has no wheezes. He has no rales.  Abdominal: Soft. Bowel sounds are normal. There is splenomegaly. There is no tenderness. There is no rebound and no guarding.  Musculoskeletal: Normal range of motion. He exhibits no edema.  Lymphadenopathy:    He has  cervical adenopathy.       Right cervical: Posterior cervical adenopathy present.       Left cervical: Posterior cervical adenopathy present.    He has axillary adenopathy.       Right: Inguinal adenopathy present.       Left: Inguinal adenopathy present.  -Bilateral bulky neck/cervical adenopathy; largest measuring about 2-3 cm.  -1 small palpable LN to (L) axilla; no palpable adenopathy to (R) axilla.  -Bilateral bulky inguinal adenopathy; largest measuring about 5 cm.   Neurological: He is alert and oriented to person, place, and time. No cranial nerve deficit. Gait normal.  Skin: Skin is warm and dry. No rash noted.  Psychiatric: Mood, memory, affect and judgment normal.  Nursing note and vitals reviewed.    LABORATORY DATA:  I have reviewed the labs as listed.  CBC    Component Value Date/Time   WBC 4.4 12/12/2016 1221   RBC  4.95 12/12/2016 1221   HGB 15.4 12/12/2016 1221   HCT 44.0 12/12/2016 1221   PLT 119 (L) 12/12/2016 1221   MCV 88.9 12/12/2016 1221   MCH 31.1 12/12/2016 1221   MCHC 35.0 12/12/2016 1221   RDW 13.1 12/12/2016 1221   LYMPHSABS 0.7 12/12/2016 1221   MONOABS 0.4 12/12/2016 1221   EOSABS 0.1 12/12/2016 1221   BASOSABS 0.0 12/12/2016 1221   CMP Latest Ref Rng & Units 12/12/2016 06/11/2016 02/23/2016  Glucose 65 - 99 mg/dL 133(H) 87 88  BUN 6 - 20 mg/dL 20 26(H) 21(H)  Creatinine 0.61 - 1.24 mg/dL 1.02 1.10 1.00  Sodium 135 - 145 mmol/L 141 134(L) 139  Potassium 3.5 - 5.1 mmol/L 3.6 3.8 3.6  Chloride 101 - 111 mmol/L 106 101 107  CO2 22 - 32 mmol/L 26 25 25   Calcium 8.9 - 10.3 mg/dL 9.1 9.2 9.1  Total Protein 6.5 - 8.1 g/dL 7.0 7.2 7.2  Total Bilirubin 0.3 - 1.2 mg/dL 1.3(H) 1.1 1.3(H)  Alkaline Phos 38 - 126 U/L 48 46 54  AST 15 - 41 U/L 26 25 26   ALT 17 - 63 U/L 30 26 24     PENDING LABS:    DIAGNOSTIC IMAGING:  *The following radiologic images and reports have been reviewed independently and agree with below findings.  CT abd/pelvis:  07/21/15      PATHOLOGY:      Peripheral blood cytogenetics: 11/03/15          ASSESSMENT & PLAN:   B-cell CLL/SLL:  -Diagnosed in 06/2015. CT in 2017 showed bulky adenopathy to bilateral hilar, mediastinal, retroperitoneal, mesenteric, and bilateral pelvic lymphadenopathy. Also with mild splenomegaly noted at that time. 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 Peripheral cytogenetic analysis in 10/2015 was normal.  -Continues to have bulky peripheral adenopathy to the cervical and inguinal lymph nodes.  -Labs reviewed today. Thrombocytopenia is mildly worse, but largely stable; platelets 119,000 today. No active bleeding episodes. Hemoglobin remains well-preserved. WBCs normal as well. Spleen is enlarged on exam.  -Weight is stable. Denies night sweats, fever, or severe fatigue.  -Discussed that based on NCCN Guidelines, currently his disease appears to be stable and there is no indication for starting treatment for CLL.   NCCN guidelines for indication for treatment of CLL are:  A. Eligible for clinical trial  B. Significant disease-related symptoms   1. Fatigue (severe)   2. Night sweats   3. Weight loss   4. Fever without infection  C. Threatened end-organ function  D. Progressive bulky disease (spleen >6cm below costal margin, lymph nodes >10 cm)  E. Progressive anemia  F. Progressive thrombocytopenia.  Numerous pulmonary nodules:  -Likely secondary to lymphoma. Asymptomatic.  -Will consider ordering repeat CT chest/abd/pelvis at follow-up visit, if clinically indicated.   Health maintenance/Wellness promotion:  -Currently does not have a PCP who he sees regularly. He has a DOT physical every 2 years.  Recommended he find a PCP for overall health maintenance, regular physical exams, age/gender appropriate cancer screenings, and vaccinations.  -Encouraged healthy diet and regular exercise.     Dispo:  -Return to cancer  center in 6 months for continued follow-up with labs.    All questions were answered to patient's stated satisfaction. Encouraged patient to call with any new concerns or questions before his next visit to the cancer center and we can certain see him sooner, if needed.    Plan of care discussed with Dr. Talbert Cage, who agrees with  the above aforementioned.    A total of 25 minutes was spent in face-to-face care of this patient, with greater than 50% of that time spent in counseling and care-coordination.    Orders placed this encounter:  Orders Placed This Encounter  Procedures  . CBC with Differential/Platelet  . Comprehensive metabolic panel  . Lactate dehydrogenase      Mike Craze, NP [place] [phone]

## 2016-12-12 NOTE — Patient Instructions (Addendum)
[facility] at [facility] Discharge Instructions  RECOMMENDATIONS MADE BY THE CONSULTANT AND ANY TEST RESULTS WILL BE SENT TO YOUR REFERRING PHYSICIAN.  You saw Gretchen Dawson, NP, today Follow up in 6 months with labs. See Amy at checkout for appointments.   Thank you for choosing [facility] at [facility] to provide your oncology and hematology care.  To afford each patient quality time with our provider, please arrive at least 15 minutes before your scheduled appointment time.    If you have a lab appointment with the Cancer Center please come in thru the  Main Entrance and check in at the main information desk  You need to re-schedule your appointment should you arrive 10 or more minutes late.  We strive to give you quality time with our providers, and arriving late affects you and other patients whose appointments are after yours.  Also, if you no show three or more times for appointments you may be dismissed from the clinic at the providers discretion.     Again, thank you for choosing [facility].  Our hope is that these requests will decrease the amount of time that you wait before being seen by our physicians.       _____________________________________________________________  Should you have questions after your visit to [facility], please contact our office at [phone] between the hours of 8:30 a.m. and 4:30 p.m.  Voicemails left after 4:30 p.m. will not be returned until the following business day.  For prescription refill requests, have your pharmacy contact our office.       Resources For Cancer Patients and their Caregivers ? American Cancer Society: Can assist with transportation, wigs, general needs, runs Look Good Feel Better.        1-888-227-6333 ? Cancer Care: Provides financial assistance, online support groups, medication/co-pay assistance.  1-800-813-HOPE (4673) ? [facility] Assists [place] cancer patients and their families through emotional , educational and financial support.  336-427-4357 ? [place] DSS Where to apply for food stamps, Medicaid and utility assistance. 336-342-1394 ? [facility]: Transportation to medical appointments. 336-347-2287 ? Social Security Administration: May apply for disability if have a Stage IV cancer. 336-342-7796 1-800-772-1213 ? [place] Aging, Disability and Transit Services: Assists with nutrition, care and transit needs. 336-349-2343  Cancer Center Support Programs: @[email or web address]@ > Cancer Support Group  2nd Tuesday of the month 1pm-2pm, Journey Room  > Creative Journey  3rd Tuesday of the month 1130am-1pm, Journey Room  > Look Good Feel Better  1st Wednesday of the month 10am-12 noon, Journey Room (Call American Cancer Society to register 1-800-395-5775)    

## 2016-12-13 LAB — IGG, IGA, IGM
IGA: 66 mg/dL — AB (ref 90–386)
IGG (IMMUNOGLOBIN G), SERUM: 827 mg/dL (ref 700–1600)
IgM, Serum: 28 mg/dL (ref 20–172)

## 2017-02-12 DIAGNOSIS — I2510 Atherosclerotic heart disease of native coronary artery without angina pectoris: Secondary | ICD-10-CM

## 2017-02-12 HISTORY — DX: Atherosclerotic heart disease of native coronary artery without angina pectoris: I25.10

## 2017-03-05 ENCOUNTER — Encounter (HOSPITAL_COMMUNITY)
Admission: EM | Disposition: A | Discharge status: Home / Self Care | Payer: Self-pay | Source: Home / Self Care | Attending: Cardiology

## 2017-03-05 ENCOUNTER — Encounter (HOSPITAL_COMMUNITY): Payer: Self-pay | Admitting: Emergency Medicine

## 2017-03-05 ENCOUNTER — Inpatient Hospital Stay (HOSPITAL_COMMUNITY)
Admission: EM | Admit: 2017-03-05 | Discharge: 2017-03-07 | DRG: 281 | Disposition: A | Discharge status: Home / Self Care | Payer: 59 | Attending: Cardiology | Admitting: Cardiology

## 2017-03-05 ENCOUNTER — Emergency Department (HOSPITAL_COMMUNITY): Payer: 59

## 2017-03-05 DIAGNOSIS — I2119 ST elevation (STEMI) myocardial infarction involving other coronary artery of inferior wall: Secondary | ICD-10-CM | POA: Diagnosis not present

## 2017-03-05 DIAGNOSIS — F17220 Nicotine dependence, chewing tobacco, uncomplicated: Secondary | ICD-10-CM | POA: Diagnosis present

## 2017-03-05 DIAGNOSIS — I2510 Atherosclerotic heart disease of native coronary artery without angina pectoris: Secondary | ICD-10-CM

## 2017-03-05 DIAGNOSIS — I213 ST elevation (STEMI) myocardial infarction of unspecified site: Secondary | ICD-10-CM

## 2017-03-05 DIAGNOSIS — C9010 Plasma cell leukemia not having achieved remission: Secondary | ICD-10-CM | POA: Diagnosis not present

## 2017-03-05 DIAGNOSIS — I214 Non-ST elevation (NSTEMI) myocardial infarction: Principal | ICD-10-CM | POA: Diagnosis present

## 2017-03-05 DIAGNOSIS — C9110 Chronic lymphocytic leukemia of B-cell type not having achieved remission: Secondary | ICD-10-CM | POA: Diagnosis present

## 2017-03-05 DIAGNOSIS — R079 Chest pain, unspecified: Secondary | ICD-10-CM

## 2017-03-05 DIAGNOSIS — D709 Neutropenia, unspecified: Secondary | ICD-10-CM | POA: Diagnosis present

## 2017-03-05 DIAGNOSIS — I25119 Atherosclerotic heart disease of native coronary artery with unspecified angina pectoris: Secondary | ICD-10-CM | POA: Insufficient documentation

## 2017-03-05 DIAGNOSIS — I252 Old myocardial infarction: Secondary | ICD-10-CM | POA: Diagnosis present

## 2017-03-05 HISTORY — DX: ST elevation (STEMI) myocardial infarction of unspecified site: I21.3

## 2017-03-05 HISTORY — PX: LEFT HEART CATH AND CORONARY ANGIOGRAPHY: CATH118249

## 2017-03-05 HISTORY — DX: Chronic lymphocytic leukemia of B-cell type not having achieved remission: C91.10

## 2017-03-05 LAB — CBC WITH DIFFERENTIAL/PLATELET
BASOS PCT: 2 %
Basophils Absolute: 0 10*3/uL (ref 0.0–0.1)
Eosinophils Absolute: 0 10*3/uL (ref 0.0–0.7)
Eosinophils Relative: 0 %
HEMATOCRIT: 41.1 % (ref 39.0–52.0)
HEMOGLOBIN: 14.5 g/dL (ref 13.0–17.0)
Lymphocytes Relative: 72 %
Lymphs Abs: 1.5 10*3/uL (ref 0.7–4.0)
MCH: 30.5 pg (ref 26.0–34.0)
MCHC: 35.3 g/dL (ref 30.0–36.0)
MCV: 86.5 fL (ref 78.0–100.0)
MONOS PCT: 25 %
Monocytes Absolute: 0.5 10*3/uL (ref 0.1–1.0)
NEUTROS ABS: 0 10*3/uL — AB (ref 1.7–7.7)
NEUTROS PCT: 1 %
Platelets: 92 10*3/uL — ABNORMAL LOW (ref 150–400)
RBC: 4.75 MIL/uL (ref 4.22–5.81)
RDW: 13.2 % (ref 11.5–15.5)
WBC: 2 10*3/uL — ABNORMAL LOW (ref 4.0–10.5)

## 2017-03-05 LAB — URINALYSIS, ROUTINE W REFLEX MICROSCOPIC
Bacteria, UA: NONE SEEN
Bilirubin Urine: NEGATIVE
Glucose, UA: NEGATIVE mg/dL
Ketones, ur: NEGATIVE mg/dL
Leukocytes, UA: NEGATIVE
Nitrite: NEGATIVE
Protein, ur: 30 mg/dL — AB
Specific Gravity, Urine: 1.026 (ref 1.005–1.030)
pH: 6 (ref 5.0–8.0)

## 2017-03-05 LAB — APTT: aPTT: 31 seconds (ref 24–36)

## 2017-03-05 LAB — BASIC METABOLIC PANEL
Anion gap: 8 (ref 5–15)
BUN: 14 mg/dL (ref 6–20)
CHLORIDE: 98 mmol/L — AB (ref 101–111)
CO2: 27 mmol/L (ref 22–32)
CREATININE: 0.82 mg/dL (ref 0.61–1.24)
Calcium: 9 mg/dL (ref 8.9–10.3)
GFR calc Af Amer: >60 mL/min (ref >60)
GFR calc non Af Amer: >60 mL/min (ref >60)
Glucose, Bld: 134 mg/dL — ABNORMAL HIGH (ref 65–99)
Potassium: 3.6 mmol/L (ref 3.5–5.1)
SODIUM: 133 mmol/L — AB (ref 135–145)

## 2017-03-05 LAB — TROPONIN I: Troponin I: 4.5 ng/mL (ref <0.03)

## 2017-03-05 LAB — I-STAT TROPONIN, ED: TROPONIN I, POC: 4.03 ng/mL — AB (ref 0.00–0.08)

## 2017-03-05 LAB — PROTIME-INR
INR: 1.08
Prothrombin Time: 14.1 seconds (ref 11.4–15.2)

## 2017-03-05 SURGERY — LEFT HEART CATH AND CORONARY ANGIOGRAPHY
Anesthesia: LOCAL

## 2017-03-05 MED ORDER — ATORVASTATIN CALCIUM 80 MG PO TABS
80.0000 mg | ORAL_TABLET | Freq: Every day | ORAL | Status: DC
  Administered 2017-03-05 – 2017-03-06 (×2): 80 mg via ORAL
  Filled 2017-03-05 (×2): qty 1

## 2017-03-05 MED ORDER — ASPIRIN 81 MG PO CHEW
81.0000 mg | CHEWABLE_TABLET | Freq: Every day | ORAL | Status: DC
  Administered 2017-03-06 – 2017-03-07 (×2): 81 mg via ORAL
  Filled 2017-03-05 (×2): qty 1

## 2017-03-05 MED ORDER — HEPARIN (PORCINE) IN NACL 100-0.45 UNIT/ML-% IJ SOLN
1000.0000 [IU]/h | Freq: Once | INTRAMUSCULAR | Status: AC
  Administered 2017-03-05: 1000 [IU]/h via INTRAVENOUS

## 2017-03-05 MED ORDER — FENTANYL CITRATE (PF) 100 MCG/2ML IJ SOLN
INTRAMUSCULAR | Status: DC | PRN
  Administered 2017-03-05: 25 ug via INTRAVENOUS

## 2017-03-05 MED ORDER — SODIUM CHLORIDE 0.9% FLUSH: 3.0000 mL | INTRAVENOUS | Status: DC | PRN

## 2017-03-05 MED ORDER — IOPAMIDOL (ISOVUE-370) INJECTION 76%
INTRAVENOUS | Status: AC
  Filled 2017-03-05: qty 125

## 2017-03-05 MED ORDER — SODIUM CHLORIDE 0.9 % IV SOLN: 250.0000 mL | INTRAVENOUS | Status: DC | PRN

## 2017-03-05 MED ORDER — HEPARIN (PORCINE) IN NACL 2-0.9 UNIT/ML-% IJ SOLN
INTRAMUSCULAR | Status: AC | PRN
  Administered 2017-03-05: 1000 mL

## 2017-03-05 MED ORDER — HEPARIN (PORCINE) IN NACL 100-0.45 UNIT/ML-% IJ SOLN
1650.0000 [IU]/h | INTRAMUSCULAR | Status: DC
  Administered 2017-03-05: 20:00:00 1000 [IU]/h via INTRAVENOUS
  Administered 2017-03-06: 17:00:00 1650 [IU]/h via INTRAVENOUS
  Filled 2017-03-05 (×2): qty 250

## 2017-03-05 MED ORDER — LIDOCAINE HCL (PF) 1 % IJ SOLN
INTRAMUSCULAR | Status: AC
  Filled 2017-03-05: qty 30

## 2017-03-05 MED ORDER — ASPIRIN 81 MG PO CHEW
324.0000 mg | CHEWABLE_TABLET | Freq: Once | ORAL | Status: AC
  Administered 2017-03-05: 324 mg via ORAL

## 2017-03-05 MED ORDER — ASPIRIN 81 MG PO CHEW
CHEWABLE_TABLET | ORAL | Status: AC
  Filled 2017-03-05: qty 4

## 2017-03-05 MED ORDER — HEPARIN SODIUM (PORCINE) 5000 UNIT/ML IJ SOLN
4000.0000 [IU] | Freq: Once | INTRAMUSCULAR | Status: AC
  Administered 2017-03-05: 4000 [IU] via INTRAVENOUS

## 2017-03-05 MED ORDER — SODIUM CHLORIDE 0.9% FLUSH
3.0000 mL | Freq: Two times a day (BID) | INTRAVENOUS | Status: DC
  Administered 2017-03-06 – 2017-03-07 (×2): 3 mL via INTRAVENOUS

## 2017-03-05 MED ORDER — ONDANSETRON HCL 4 MG/2ML IJ SOLN: 4.0000 mg | Freq: Four times a day (QID) | INTRAMUSCULAR | Status: DC | PRN

## 2017-03-05 MED ORDER — CLOPIDOGREL BISULFATE 75 MG PO TABS
75.0000 mg | ORAL_TABLET | Freq: Every day | ORAL | Status: DC
  Administered 2017-03-06 – 2017-03-07 (×2): 75 mg via ORAL
  Filled 2017-03-05 (×2): qty 1

## 2017-03-05 MED ORDER — NITROGLYCERIN IN D5W 200-5 MCG/ML-% IV SOLN
INTRAVENOUS | Status: AC
  Filled 2017-03-05: qty 250

## 2017-03-05 MED ORDER — VERAPAMIL HCL 2.5 MG/ML IV SOLN
INTRAVENOUS | Status: AC
  Filled 2017-03-05: qty 2

## 2017-03-05 MED ORDER — ACETAMINOPHEN 325 MG PO TABS: 650.0000 mg | ORAL_TABLET | ORAL | Status: DC | PRN

## 2017-03-05 MED ORDER — MORPHINE SULFATE (PF) 4 MG/ML IV SOLN: 2.0000 mg | INTRAVENOUS | Status: DC | PRN

## 2017-03-05 MED ORDER — HEPARIN (PORCINE) IN NACL 2-0.9 UNIT/ML-% IJ SOLN
INTRAMUSCULAR | Status: AC
  Filled 2017-03-05: qty 1000

## 2017-03-05 MED ORDER — VERAPAMIL HCL 2.5 MG/ML IV SOLN
INTRAVENOUS | Status: AC
Start: 2017-03-05 — End: ?
  Filled 2017-03-05: qty 2

## 2017-03-05 MED ORDER — FENTANYL CITRATE (PF) 100 MCG/2ML IJ SOLN
INTRAMUSCULAR | Status: AC
  Filled 2017-03-05: qty 2

## 2017-03-05 MED ORDER — HEPARIN (PORCINE) IN NACL 100-0.45 UNIT/ML-% IJ SOLN
INTRAMUSCULAR | Status: AC
  Filled 2017-03-05: qty 250

## 2017-03-05 MED ORDER — ANGIOPLASTY BOOK
Freq: Once | Status: DC
  Filled 2017-03-05: qty 1

## 2017-03-05 MED ORDER — METOPROLOL TARTRATE 25 MG PO TABS
25.0000 mg | ORAL_TABLET | Freq: Two times a day (BID) | ORAL | Status: DC
  Administered 2017-03-05 – 2017-03-07 (×4): 25 mg via ORAL
  Filled 2017-03-05 (×4): qty 1

## 2017-03-05 MED ORDER — CLOPIDOGREL BISULFATE 75 MG PO TABS
300.0000 mg | ORAL_TABLET | Freq: Once | ORAL | Status: AC
  Administered 2017-03-05: 20:00:00 300 mg via ORAL
  Filled 2017-03-05: qty 4

## 2017-03-05 MED ORDER — MIDAZOLAM HCL 2 MG/2ML IJ SOLN
INTRAMUSCULAR | Status: AC
  Filled 2017-03-05: qty 2

## 2017-03-05 MED ORDER — NITROGLYCERIN 1 MG/10 ML FOR IR/CATH LAB
INTRA_ARTERIAL | Status: AC
Start: 2017-03-05 — End: ?
  Filled 2017-03-05: qty 10

## 2017-03-05 MED ORDER — MIDAZOLAM HCL 2 MG/2ML IJ SOLN
INTRAMUSCULAR | Status: DC | PRN
  Administered 2017-03-05: 2 mg via INTRAVENOUS

## 2017-03-05 MED ORDER — HEPARIN (PORCINE) IN NACL 2-0.9 UNIT/ML-% IJ SOLN
INTRAMUSCULAR | Status: DC | PRN
  Administered 2017-03-05: 10 mL via INTRA_ARTERIAL

## 2017-03-05 MED ORDER — HEPARIN SODIUM (PORCINE) 1000 UNIT/ML IJ SOLN
INTRAMUSCULAR | Status: DC | PRN
  Administered 2017-03-05: 4000 [IU] via INTRAVENOUS

## 2017-03-05 MED ORDER — NITROGLYCERIN IN D5W 200-5 MCG/ML-% IV SOLN
5.0000 ug/min | INTRAVENOUS | Status: DC
  Administered 2017-03-05: 5 ug/min via INTRAVENOUS

## 2017-03-05 MED ORDER — HEPARIN SODIUM (PORCINE) 1000 UNIT/ML IJ SOLN
INTRAMUSCULAR | Status: AC
  Filled 2017-03-05: qty 1

## 2017-03-05 MED ORDER — IOPAMIDOL (ISOVUE-370) INJECTION 76%
INTRAVENOUS | Status: DC | PRN
  Administered 2017-03-05: 145 mL

## 2017-03-05 MED ORDER — METOPROLOL TARTRATE 5 MG/5ML IV SOLN
5.0000 mg | Freq: Once | INTRAVENOUS | Status: AC
  Administered 2017-03-05: 5 mg via INTRAVENOUS
  Filled 2017-03-05: qty 5

## 2017-03-05 MED ORDER — SODIUM CHLORIDE 0.9 % IV SOLN: INTRAVENOUS | Status: DC

## 2017-03-05 MED ORDER — HEPARIN (PORCINE) IN NACL 100-0.45 UNIT/ML-% IJ SOLN: 1000.0000 [IU]/h | INTRAMUSCULAR | Status: DC

## 2017-03-05 SURGICAL SUPPLY — 14 items
CATH EXPO 5FR FR4 (CATHETERS) ×1 IMPLANT
CATH INFINITI 5FR ANG PIGTAIL (CATHETERS) ×1 IMPLANT
CATH VISTA GUIDE 6FR XBLAD3.5 (CATHETERS) ×1 IMPLANT
DEVICE RAD COMP TR BAND LRG (VASCULAR PRODUCTS) ×1 IMPLANT
ELECT DEFIB PAD ADLT CADENCE (PAD) ×1 IMPLANT
GLIDESHEATH SLEND A-KIT 6F 22G (SHEATH) ×1 IMPLANT
GUIDEWIRE INQWIRE 1.5J.035X260 (WIRE) IMPLANT
INQWIRE 1.5J .035X260CM (WIRE) ×2
KIT HEART LEFT (KITS) ×2 IMPLANT
PACK CARDIAC CATHETERIZATION (CUSTOM PROCEDURE TRAY) ×2 IMPLANT
SYR MEDRAD MARK V 150ML (SYRINGE) ×2 IMPLANT
TRANSDUCER W/STOPCOCK (MISCELLANEOUS) ×2 IMPLANT
TUBING CIL FLEX 10 FLL-RA (TUBING) ×2 IMPLANT
VALVE GUARDIAN II ~~LOC~~ HEMO (MISCELLANEOUS) ×1 IMPLANT

## 2017-03-05 NOTE — Progress Notes (Signed)
3cc's of air removed from Tr band, 3cc's of air remain. Pt tolerated well, will continue to monitor.

## 2017-03-05 NOTE — Brief Op Note (Signed)
    03/05/2017  9:47 AM  PATIENT:  Adam Reyes  61 y.o. male with no prior past lesion within chronic lymphoid leukemia with watchful management and no therapy. He presented with signs symptoms concerning for ACS with EKG changes concerning for possible inferior lateral ST elevation MI with troponin of ~4. Given his persistent symptoms for 24 hours and positive troponins with concerning EKGs, we consider this a possible anterolateral STEMI and called code STEMI. He presented for emergent cardiac catheterization  PRE-OPERATIVE DIAGNOSIS:  stemi  POST-OPERATIVE DIAGNOSIS:  No culprit lesion to explain STEMI diagnosis. Would likely be more consistent with non-STEMI diagnosis.  The RCA and codominant circumflex with large OM branch are all relatively normal incorrectly.  The LAD has diffuse mild disease proximally with a roughly 50% irregular lesion involving the ostium of D1 his lesions do not appear to be occlusive. -> Given that the patient essentially chest pain-free, I did not feel that it was prudent to intervene on this bifurcation lesion that did not appear to be flow-limiting at this point.  Preserved LVEF of roughly 55% with no obvious regional wall motion abnormalities.  PROCEDURE:  Procedure(s): LEFT HEART CATH AND CORONARY ANGIOGRAPHY (N/A)   Right radial access with Angiocath micropuncture Kit placing 6 French glide sheath.   RCA angiography with 5 [place] JR4.  LCA angiography with 6 [place] XB LAD guide catheter  LV gram with 5 French angled pigtail catheter  Catheters were removed completely out of the body over wire without complication  SURGEON:  Surgeon(s) and Role:    * Leonie Man, MD - Primary  ANESTHESIA:   4 mg subcutaneous lidocaine, 2 mg IV Versed and 20, grams IV fentanyl. Sedation time was 29 minutes. All monitored with vital signs and oxygen levels.  EBL:   < 50 mL  MEDICATIONS USED: IV heparin 4000 units, contrast 145 mL, radial cocktail  consisting of 3 mg IA verapamil  COUNTS:  YES  TR BAND:  Radial sheath removed. TR band placed at 0928 hrs., 14 mL air  -- No complications  DICTATION: .Note written in EPIC  PLAN OF CARE: Admit to inpatient - IV heparin for 48 hours. Will treat with aspirin and Plavix from the 3 months given positive troponins.  PATIENT DISPOSITION:  would monitor for 2-3 days in the hospital for recurrent symptoms and attrition medications.   Delay start of Pharmacological VTE agent (>24hrs) due to surgical blood loss or risk of bleeding: not applicable    Glenetta Hew, M.D., M.S. Interventional Cardiologist   Pager # [phone] Phone # [phone] [address] [place], [place] [address]

## 2017-03-05 NOTE — Progress Notes (Signed)
ANTICOAGULATION CONSULT NOTE - Follow Up Consult  Pharmacy Consult for heparin Indication: chest pain/ACS  No Known Allergies  Patient Measurements: Height: 6\' 4"  (193 cm) Weight: 275 lb (124.7 kg) IBW/kg (Calculated) : 86.8 Heparin Dosing Weight: 113kg  Vital Signs: Temp: 100.2 F (37.9 C) (08/22 0810) Temp Source: Oral (08/22 0810) BP: 117/40 (08/22 1300) Pulse Rate: 85 (08/22 1300)  Labs:  Recent Labs  03/05/17 0709 03/05/17 0710  HGB  --  14.5  HCT  --  41.1  PLT  --  92*  APTT 31  --   LABPROT 14.1  --   INR 1.08  --   CREATININE  --  0.82  TROPONINI 4.50*  --     Estimated Creatinine Clearance: 139.9 mL/min (by C-G formula based on SCr of 0.82 mg/dL).   Medications:  Prescriptions Prior to Admission  Medication Sig Dispense Refill Last Dose  . Ascorbic Acid (VITAMIN C PO) Take 1 tablet by mouth daily.        Assessment: 61 yo male here with r/o ACS now s/p cath with no culprit lesion noted. Pharmacy consulted to re-start heparin 8 hours post sheath removal (removed at ~ 9:30am). Plans to run heparin for a total of 48hrs -heparin was previously running at 1000 units/hr  Goal of Therapy:  Heparin level 0.3-0.7 units/ml Monitor platelets by anticoagulation protocol: Yes   Plan:  -Restart heparin at 5:30pm at 1000 units/per -Will run for 48 hours -Heparin level in 6 hours and daily wth CBC daily  Hildred Laser, Pharm D 03/05/2017 1:19 PM

## 2017-03-05 NOTE — Progress Notes (Signed)
ANTICOAGULATION CONSULT NOTE - Initial Consult  Pharmacy Consult for Heparin Indication: chest pain/ACS  No Known Allergies  Patient Measurements: Height: 6\' 4"  (193 cm) Weight: 275 lb (124.7 kg) IBW/kg (Calculated) : 86.8 HEPARIN DW (KG): 113.4   Vital Signs: Temp: 99.2 F (37.3 C) (08/22 0708) Temp Source: Oral (08/22 0708) BP: 144/88 (08/22 0708) Pulse Rate: 83 (08/22 0708)  Labs:  Recent Labs  03/05/17 0710  HGB 14.5  HCT 41.1  PLT 92*  CREATININE 0.82    Estimated Creatinine Clearance: 139.9 mL/min (by C-G formula based on SCr of 0.82 mg/dL).   Medical History: Past Medical History:  Diagnosis Date  . Cancer ([place])    Lymphoma  . Enlarged lymph node    left neck   Medications:   (Not in a hospital admission)  Home meds reviewed, Med Rec pending - no anticoagulants currently listed.  Assessment: Okay for Protocol.  Baseline anticoag labs pending.  Heparin already started in ED per MD orders.  PLTC 92K (recent range 119-128).  Hx Lymphoma noted.  Goal of Therapy:  Heparin level 0.3-0.7 units/ml Monitor platelets by anticoagulation protocol: Yes   Plan:  Heparin load 4000 units already given. Heparin @ 1000 units/hr. F/U post cardiac cath. Daily Anti-Xa / CBC if therapy continued post cath.  Monitor labs, micro and vitals.    Biagio Quint R 03/05/2017,7:48 AM

## 2017-03-05 NOTE — Progress Notes (Signed)
3cc's of air removed from TR Band. No air remain in band. Will continue to monitor Tr band for 30 minutes. Pt tolerated well, will continue to monitor.

## 2017-03-05 NOTE — ED Provider Notes (Addendum)
[facility] DEPT Provider Note   CSN: [id number] Arrival date & time: 03/05/17  8119     History   Chief Complaint Chief Complaint  Patient presents with  . Chest Pain    HPI Adam Reyes is a 61 y.o. male.  Level V caveat for urgent need for intervention. Patient complains of chest pain for approximately 24 hours. No previous history of cardiac disease.  Pain is described as a pressure. He is also dyspneic on exertion.  Nonsmoker. No family history of CAD. His past medical history includes CLL.      Past Medical History:  Diagnosis Date  . 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 Enlarged lymph node    left neck    Patient Active Problem List   Diagnosis Date Noted  . ST elevation myocardial infarction (STEMI) of inferolateral wall, initial episode of care ([facility]) 03/05/2017  . Chronic lymphocytic leukemia (CLL), B-cell ([place]) 07/11/2015    Past Surgical History:  Procedure Laterality Date  . COLONOSCOPY N/A 10/11/2015   Procedure: COLONOSCOPY;  Surgeon: Rogene Houston, MD;  [facility];  Service: Endoscopy;  Laterality: N/A;  10/11/2015  . MASS BIOPSY Left 06/27/2015   Procedure: OPEN LEFT NECK BIOPSY ;  Surgeon: Leta Baptist, MD;  Location: [facility];  Service: ENT;  Laterality: Left;  . NO PAST SURGERIES         Home Medications    Prior to Admission medications   Medication Sig Start Date End Date Taking? Authorizing Provider  Ascorbic Acid (VITAMIN C PO) Take 1 tablet by mouth daily.    [provider]    Family History Family History  Problem Relation Age of Onset  . Diabetes Mother   . Cancer Father   . Cancer Brother     Social History Social History  Substance Use Topics  . Smoking status: Never Smoker  . Smokeless tobacco: Former Systems developer    Types: Chew, Snuff  . Alcohol use Yes     Comment: social     Allergies   Patient has no known allergies.   Review of  Systems Review of Systems  Unable to perform ROS: Acuity of condition     Physical Exam Updated Vital Signs BP 100/62 (BP Location: Left Arm)   Pulse 73   Temp 99.2 F (37.3 C) (Oral)   Resp 14   Ht 6\' 4"  (1.93 m)   Wt 124.7 kg (275 lb)   SpO2 98%   BMI 33.47 kg/m   Physical Exam  Constitutional: He is oriented to person, place, and time. He appears well-developed and well-nourished.  HENT:  Head: Normocephalic and atraumatic.  Eyes: Conjunctivae are normal.  Neck: Neck supple.  Cardiovascular: Normal rate and regular rhythm.   Pulmonary/Chest: Effort normal and breath sounds normal.  Abdominal: Soft. Bowel sounds are normal.  Musculoskeletal: Normal range of motion.  Neurological: He is alert and oriented to person, place, and time.  Skin: Skin is warm and dry.  Psychiatric: He has a normal mood and affect. His behavior is normal.  Nursing note and vitals reviewed.    ED Treatments / Results  Labs (all labs ordered are listed, but only abnormal results are displayed) Labs Reviewed  BASIC METABOLIC PANEL - Abnormal; Notable for the following:       Result Value   Sodium 133 (*)    Chloride 98 (*)    Glucose, Bld 134 (*)  All other components within normal limits  CBC WITH DIFFERENTIAL/PLATELET - Abnormal; Notable for the following:    WBC 2.0 (*)    Platelets 92 (*)    All other components within normal limits  TROPONIN I - Abnormal; Notable for the following:    Troponin I 4.50 (*)    All other components within normal limits  I-STAT TROPONIN, ED - Abnormal; Notable for the following:    Troponin i, poc 4.03 (*)    All other components within normal limits  APTT  PROTIME-INR    EKG  EKG Interpretation  Date/Time:  Wednesday March 05 2017 07:04:35 EDT Ventricular Rate:  82 PR Interval:    QRS Duration: 97 QT Interval:  365 QTC Calculation: 427 R Axis:   31 Text Interpretation:  Sinus rhythm Consider left atrial enlargement Abnormal R-wave  progression, early transition Inferior infarct, acute (LCx) Lateral leads are also involved Confirmed by Nat Christen [phone]) on 03/05/2017 7:21:01 AM Also confirmed by Nat Christen [phone])  on 03/05/2017 8:01:29 AM       Radiology No results found.  Procedures Procedures (including critical care time)  Medications Ordered in ED Medications  heparin 100-0.45 UNIT/ML-% infusion (not administered)  nitroGLYCERIN 50 mg in dextrose 5 % 250 mL (0.2 mg/mL) infusion (5 mcg/min Intravenous New Bag/Given 03/05/17 0747)  aspirin chewable tablet 324 mg (324 mg Oral Given 03/05/17 0733)  heparin injection 4,000 Units (4,000 Units Intravenous Given 03/05/17 0740)  metoprolol tartrate (LOPRESSOR) injection 5 mg (5 mg Intravenous Given 03/05/17 0749)  heparin ADULT infusion 100 units/mL (25000 units/264mL sodium chloride 0.45%) (1,000 Units/hr Intravenous New Bag/Given 03/05/17 0745)     Initial Impression / Assessment and Plan / ED Course  I have reviewed the triage vital signs and the nursing notes.  Pertinent labs & imaging results that were available during my care of the patient were reviewed by me and considered in my medical decision making (see chart for details).     Patient presents with a convincing history of acute coronary syndrome. His first EKG was equivocal. Second EKG was more convincing for an acute MI. Troponin elevated 4.03. Code STEMI was initiated. I discussed his care with Dr. Ellyn Hack in [place]. He will accept transfer. Aspirin, nitroglycerin, heparin, Lopressor started. Patient is hemodynamically stable at transfer.   CRITICAL CARE Performed by: Nat Christen  ?  Total critical care time: 40 minutes  Critical care time was exclusive of separately billable procedures and treating other patients.  Critical care was necessary to treat or prevent imminent or life-threatening deterioration.  Critical care was time spent personally by me on the following activities: development  of treatment plan with patient and/or surrogate as well as nursing, discussions with consultants, evaluation of patient's response to treatment, examination of patient, obtaining history from patient or surrogate, ordering and performing treatments and interventions, ordering and review of laboratory studies, ordering and review of radiographic studies, pulse oximetry and re-evaluation of patient's condition.  Final Clinical Impressions(s) / ED Diagnoses   Final diagnoses:  Chest pain    New Prescriptions New Prescriptions   No medications on file     Nat Christen, MD 03/05/17 5361    Nat Christen, MD 03/05/17 4431    Nat Christen, MD 03/06/17 [phone]

## 2017-03-05 NOTE — ED Notes (Signed)
Dr. Lacinda Axon notified of troponin 4.03.

## 2017-03-05 NOTE — Progress Notes (Signed)
TR BAND REMOVAL  LOCATION:    Right radial  DEFLATED PER PROTOCOL:    Yes.    TIME BAND OFF / DRESSING APPLIED:    1245p   SITE UPON ARRIVAL:    Level 0  SITE AFTER BAND REMOVAL:    Level 0  CIRCULATION SENSATION AND MOVEMENT:    Within Normal Limits   Yes.    COMMENTS:   2+ radial pulse, normal skin tone.  Pt denies any discomfort at site.  Radial site care instructions given.

## 2017-03-05 NOTE — ED Notes (Signed)
CRITICAL VALUE ALERT  Critical Value:  Troponin 4.50  Date & Time Notied:  03/05/17 0756  Provider Notified: Dr. Lacinda Axon  Orders Received/Actions taken: MD notified, no further orders given

## 2017-03-05 NOTE — ED Notes (Signed)
EKG given to Dr. Cook  

## 2017-03-05 NOTE — ED Triage Notes (Signed)
Pt states started having cp to mid chest yesterday that was aching with some sob. Denies any other sx's. Denies radiation. Pt states it has been constant pain that is worsened with eat. Nad. Non diaphoretic at this time.

## 2017-03-05 NOTE — H&P (Addendum)
History and Physical Note  NAME:  Adam Reyes   MRN: [id number] DOB:  March 22, 1956   ADMIT DATE: 03/05/2017   03/05/2017 9:31 AM  Adam Reyes is a 61 y.o. male with past medical history notable for chronic B-cell leukemia/small cell lymphoma otherwise relatively healthy. He is a nonsmoker who does use chewing tobacco. He was in his usual state of health until roughly 24 hour prior to presentation he began to note intermittent episodes of chest discomfort worse 5/10.  He also noted exertional dyspnea and some mild nausea but no vomiting. No PND, orthopnea or edema. He denies any rapid heartbeats palpitations. No syncope or near syncope. No TIA or amaurosis fugax in this. No melena, hematochezia, hematuria, or epistaxis.  He presented to [facility] emergency room at roughly 7 AM this morning. Initial EKG was nondiagnostic with some subtle inferolateral ST segment changes. This became concerning was in his troponin level went to >4.0. Repeat EKG was a better tracing did show some potentially concerning some 1-2 mm elevations in II most notably along with I and V5-V6. With his intermittent chest discomfort that was persistently occurring in the ER and his EKG changes along the spine, we decided to call us as a code STEMI and he was transported directly to [facility] for emergent catheterization. Upon arrival he was intermittently having 1/10 chest pain. He looked very comfortable on exam.   Past Medical History:  Diagnosis Date  . 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 Enlarged lymph node    left neck   Past Surgical History:  Procedure Laterality Date  . COLONOSCOPY N/A 10/11/2015   Procedure: COLONOSCOPY;  Surgeon: Rogene Houston, MD;  [facility];  Service: Endoscopy;  Laterality: N/A;  10/11/2015  . MASS BIOPSY Left 06/27/2015   Procedure: OPEN LEFT NECK BIOPSY ;  Surgeon: Leta Baptist, MD;  Location: [facility];  Service: ENT;  Laterality: Left;  . NO PAST SURGERIES      FAMHx: Family History  Problem Relation Age of Onset  . Diabetes Mother   . Cancer Father   . Cancer Brother     SOCHx:  reports that he has never smoked. He has quit using smokeless tobacco. His smokeless tobacco use included Chew and Snuff. He reports that he drinks alcohol. He reports that he does not use drugs.  ALLERGIES: No Known Allergies  HOME MEDICATIONS: Prescriptions Prior to Admission  Medication Sig Dispense Refill Last Dose  . Ascorbic Acid (VITAMIN C PO) Take 1 tablet by mouth daily.        PHYSICAL EXAM:Blood pressure 112/75, pulse 82, temperature 100.2 F (37.9 C), temperature source Oral, resp. rate 20, height 6\' 4"  (1.93 m), weight 275 lb (124.7 kg), SpO2 99 %. General appearance: alert, cooperative, appears stated age, mild distress and Otherwise healthy-appearing Neck: no adenopathy, no carotid bruit and no JVD Lungs: clear to auscultation bilaterally, normal percussion bilaterally and Nonlabored, good air movement Heart: regular rate and rhythm, S1, S2 normal, no murmur, click, rub or gallop and normal apical impulse Abdomen: soft, non-tender; bowel sounds normal; no masses,  no organomegaly Extremities: extremities normal, atraumatic, no cyanosis or edema Pulses: 2+ and symmetric Skin: Skin color, texture, turgor normal. No rashes or lesions Neurologic: Mental status: Alert, oriented, thought content appropriate Cranial nerves: normal  Review of Systems  Constitutional: Positive for malaise/fatigue (Only since onset of symptoms). Negative  for chills and fever.  HENT: Negative for congestion and sinus pain.   Eyes: Negative for blurred vision and double vision.  Respiratory: Negative for cough and wheezing.   Cardiovascular:       Per history of present illness  Gastrointestinal: Negative for blood in stool, heartburn and melena.  Genitourinary: Negative for frequency and urgency.    Musculoskeletal: Negative for joint pain and myalgias.  Neurological: Negative for dizziness.  Endo/Heme/Allergies: Negative for environmental allergies.  Psychiatric/Behavioral: Negative for depression.  All other systems reviewed and are negative.    Adult ECG Report  Rate: 90 ;  Rhythm: normal sinus rhythm and Subtle 1-2 mm elevations in I, V5, V6 with more pronounced ST elevations in II. No reciprocal changes noted. otherwise normal axis, intervals and durations.  Narrative Interpretation: Subtle changes that have some consistency with inferolateral ST elevation MI.   IMPRESSION & PLAN The patients' history has been reviewed, patient examined, no change in status from most recent note, stable for surgery. I have reviewed the patients' chart and labs. Questions were answered to the patient's satisfaction.    Adam Reyes has presented today foremergent cardiac catheterization, with the diagnosis ofpotential inferior lateral ST elevation MI.  The various methods of treatment have been discussed with the patient .   Risks / Complications include, but not limited to: Death, MI, CVA/TIA, VF/VT (with defibrillation), Bradycardia (need for temporary pacer placement), contrast induced nephropathy , bleeding / bruising / hematoma / pseudoaneurysm, vascular or coronary injury (with possible emergent CT or Vascular Surgery), adverse medication reactions, infection.     After consideration of risks, benefits and other options for treatment, the patient voiced verbal consent for Procedure(s):  LEFT HEART CATHETERIZATION AND CORONARY ANGIOGRAPHY +/- AD HOC PERCUTANEOUS CORONARY INTERVENTION   as a surgical intervention.   We will proceed with the planned procedure.   03/05/2017 9:31 AM  Glenetta Hew, M.D., M.S. Interventional Cardiologist   Pager # [phone] Phone # [phone] [address] [place], [place] [address]

## 2017-03-05 NOTE — ED Notes (Signed)
Pt leaving with [facility] at this time, wife has pt belongings.  [facility] given carelink transfer, medical necessity, emtala, e-signature, and facesheet.  Pt alert and oriented at transfer time.

## 2017-03-05 NOTE — Progress Notes (Signed)
Temp elevated in this patient with chronic B cell leukemia. Cathed today with only 50% LAD lesion despite trop of 4. Need to rule out infection, ordered urinalysis and blood culture x2.  Hilbert Corrigan PA Pager: [phone]

## 2017-03-06 ENCOUNTER — Encounter (HOSPITAL_COMMUNITY): Payer: Self-pay | Admitting: Cardiology

## 2017-03-06 DIAGNOSIS — C9010 Plasma cell leukemia not having achieved remission: Secondary | ICD-10-CM

## 2017-03-06 LAB — BLOOD CULTURE ID PANEL (REFLEXED)
ACINETOBACTER BAUMANNII: NOT DETECTED
CANDIDA ALBICANS: NOT DETECTED
CANDIDA KRUSEI: NOT DETECTED
Candida glabrata: NOT DETECTED
Candida parapsilosis: NOT DETECTED
Candida tropicalis: NOT DETECTED
ENTEROBACTERIACEAE SPECIES: NOT DETECTED
ENTEROCOCCUS SPECIES: NOT DETECTED
ESCHERICHIA COLI: NOT DETECTED
Enterobacter cloacae complex: NOT DETECTED
Haemophilus influenzae: NOT DETECTED
Klebsiella oxytoca: NOT DETECTED
Klebsiella pneumoniae: NOT DETECTED
LISTERIA MONOCYTOGENES: NOT DETECTED
Methicillin resistance: NOT DETECTED
Neisseria meningitidis: NOT DETECTED
PSEUDOMONAS AERUGINOSA: NOT DETECTED
Proteus species: NOT DETECTED
SERRATIA MARCESCENS: NOT DETECTED
STAPHYLOCOCCUS AUREUS BCID: NOT DETECTED
STREPTOCOCCUS PNEUMONIAE: NOT DETECTED
STREPTOCOCCUS PYOGENES: NOT DETECTED
Staphylococcus species: DETECTED — AB
Streptococcus agalactiae: NOT DETECTED
Streptococcus species: NOT DETECTED

## 2017-03-06 LAB — CBC
HCT: 36.3 % — ABNORMAL LOW (ref 39.0–52.0)
HEMOGLOBIN: 12.7 g/dL — AB (ref 13.0–17.0)
MCH: 30 pg (ref 26.0–34.0)
MCHC: 35 g/dL (ref 30.0–36.0)
MCV: 85.8 fL (ref 78.0–100.0)
Platelets: 93 10*3/uL — ABNORMAL LOW (ref 150–400)
RBC: 4.23 MIL/uL (ref 4.22–5.81)
RDW: 13.4 % (ref 11.5–15.5)
WBC: 2.8 10*3/uL — ABNORMAL LOW (ref 4.0–10.5)

## 2017-03-06 LAB — HEPARIN LEVEL (UNFRACTIONATED)
Heparin Unfractionated: <0.1 IU/mL — ABNORMAL LOW (ref 0.30–0.70)
Heparin Unfractionated: <0.1 IU/mL — ABNORMAL LOW (ref 0.30–0.70)
Heparin Unfractionated: 0.15 IU/mL — ABNORMAL LOW (ref 0.30–0.70)

## 2017-03-06 MED ORDER — HEPARIN (PORCINE) IN NACL 100-0.45 UNIT/ML-% IJ SOLN
1950.0000 [IU]/h | INTRAMUSCULAR | Status: DC
  Administered 2017-03-06 – 2017-03-07 (×2): 1950 [IU]/h via INTRAVENOUS
  Filled 2017-03-06: qty 250

## 2017-03-06 MED ORDER — SODIUM CHLORIDE 0.9 % IV SOLN
2000.0000 mg | Freq: Once | INTRAVENOUS | Status: AC
  Administered 2017-03-06: 2000 mg via INTRAVENOUS
  Filled 2017-03-06: qty 2000

## 2017-03-06 MED ORDER — VANCOMYCIN HCL 10 G IV SOLR
1250.0000 mg | Freq: Two times a day (BID) | INTRAVENOUS | Status: DC
  Filled 2017-03-06: qty 1250

## 2017-03-06 MED FILL — Lidocaine HCl Local Preservative Free (PF) Inj 1%: INTRAMUSCULAR | Qty: 30 | Status: AC

## 2017-03-06 MED FILL — Nitroglycerin IV Soln 100 MCG/ML in D5W: INTRA_ARTERIAL | Qty: 10 | Status: AC

## 2017-03-06 NOTE — Progress Notes (Signed)
ANTICOAGULATION CONSULT NOTE - Follow Up Consult  Pharmacy Consult for heparin Indication: chest pain/ACS  No Known Allergies  Patient Measurements: Height: 6\' 4"  (193 cm) Weight: 272 lb 7.8 oz (123.6 kg) IBW/kg (Calculated) : 86.8 Heparin Dosing Weight: 113kg  Vital Signs: Temp: 98.9 F (37.2 C) (08/23 1801) Temp Source: Oral (08/23 1801) BP: 129/66 (08/23 1900) Pulse Rate: 83 (08/23 1900)  Labs:  Recent Labs  03/05/17 0709 03/05/17 0710 03/06/17 0528 03/06/17 1253 03/06/17 2026  HGB  --  14.5 12.7*  --   --   HCT  --  41.1 36.3*  --   --   PLT  --  92* 93*  --   --   APTT 31  --   --   --   --   LABPROT 14.1  --   --   --   --   INR 1.08  --   --   --   --   HEPARINUNFRC  --   --  <0.10* <0.10* 0.15*  CREATININE  --  0.82  --   --   --   TROPONINI 4.50*  --   --   --   --     Estimated Creatinine Clearance: 139.3 mL/min (by C-G formula based on SCr of 0.82 mg/dL).  Medications: Heparin @ 1650 units/hr  Assessment: 61 yo male here with r/o ACS now s/p cath with no culprit lesion noted. Pharmacy consulted to re-start heparin 8 hours post sheath removal with plans to run heparin for a total of 48hrs through 8/24.   Heparin level remains low at 0.15 but starting to increase. No issues with infusion.  Goal of Therapy:  Heparin level 0.3-0.7 units/ml Monitor platelets by anticoagulation protocol: Yes   Plan:  1) Increase heparin to 1950 units/hr 2) Follow up daily heparin level and CBC  Nena Jordan, PharmD, BCPS 03/06/2017 9:14 PM

## 2017-03-06 NOTE — Progress Notes (Signed)
Pharmacy Antibiotic Note  Adam Reyes is a 61 y.o. male admitted on 03/05/2017 at [facility] with chest discomfort and then transferred to [facility] for heart cath.  Now with 1 of 2 blood culture growing GPC (preliminary result).  Pharmacy has been consulted for vancomycin dosing.  SCr 0.82 with normalized CrCL ~98 ml/min.  Patient had a fever yesterday and his WBC is low at 2.8.   Plan: Vanc 2gm IV x 1, then 1250mg  IV Q12H Monitor renal fxn, micro data, vanc trough at Css BMET in AM  Height: 6\' 4"  (193 cm) Weight: 272 lb 7.8 oz (123.6 kg) IBW/kg (Calculated) : 86.8  Temp (24hrs), Avg:99.2 F (37.3 C), Min:98.7 F (37.1 C), Max:99.7 F (37.6 C)   Recent Labs Lab 03/05/17 0710 03/06/17 0528  WBC 2.0* 2.8*  CREATININE 0.82  --     Estimated Creatinine Clearance: 139.3 mL/min (by C-G formula based on SCr of 0.82 mg/dL).    No Known Allergies   Vanc 8/23 >>  8/22 BCx - 1 of 2 GPC    Halli Equihua D. Mina Marble, PharmD, BCPS Pager:  [phone] 03/06/2017, 10:00 PM

## 2017-03-06 NOTE — Progress Notes (Signed)
ANTICOAGULATION CONSULT NOTE - Follow Up Consult  Pharmacy Consult for heparin Indication: chest pain/ACS  No Known Allergies  Patient Measurements: Height: 6\' 4"  (193 cm) Weight: 272 lb 7.8 oz (123.6 kg) IBW/kg (Calculated) : 86.8 Heparin Dosing Weight: 113kg  Vital Signs: Temp: 99.1 F (37.3 C) (08/23 1109) Temp Source: Oral (08/23 1109) BP: 135/69 (08/23 1109) Pulse Rate: 81 (08/23 1109)  Labs:  Recent Labs  03/05/17 0709 03/05/17 0710 03/06/17 0528 03/06/17 1253  HGB  --  14.5 12.7*  --   HCT  --  41.1 36.3*  --   PLT  --  92* 93*  --   APTT 31  --   --   --   LABPROT 14.1  --   --   --   INR 1.08  --   --   --   HEPARINUNFRC  --   --  <0.10* <0.10*  CREATININE  --  0.82  --   --   TROPONINI 4.50*  --   --   --     Estimated Creatinine Clearance: 139.3 mL/min (by C-G formula based on SCr of 0.82 mg/dL).   Medications:  Prescriptions Prior to Admission  Medication Sig Dispense Refill Last Dose  . Ascorbic Acid (VITAMIN C PO) Take 1 tablet by mouth daily.   Past Week at Unknown time   Assessment: 61 yo male here with r/o ACS now s/p cath with no culprit lesion noted. Pharmacy consulted to re-start heparin 8 hours post sheath removal with plans to run heparin for a total of 48hrs through 8/24.   Heparin level remains undetectable after rate increase this AM with no infusion issues per RN. Noted Hgb drop from 14.5 to 12.7 and platelets low but stable. No bleeding noted.   Goal of Therapy:  Heparin level 0.3-0.7 units/ml Monitor platelets by anticoagulation protocol: Yes   Plan:  Increase heparin gtt to 1650 units/hr  Heparin level in 6 hrs Daily heparin level and CBC Monitor for s/s bleeding  F/u heparin gtt end time 8/24  Elicia Lamp, PharmD, BCPS Clinical Pharmacist Rx Phone # for today: [phone] After 3:30PM, please call Main Rx: [phone] 03/06/2017 2:22 PM

## 2017-03-06 NOTE — Progress Notes (Signed)
PHARMACY - PHYSICIAN COMMUNICATION CRITICAL VALUE ALERT - BLOOD CULTURE IDENTIFICATION (BCID)  Results for orders placed or performed during the hospital encounter of 03/05/17  Blood Culture ID Panel (Reflexed) (Collected: 03/05/2017  7:14 PM)  Result Value Ref Range   Enterococcus species NOT DETECTED NOT DETECTED   Listeria monocytogenes NOT DETECTED NOT DETECTED   Staphylococcus species DETECTED (A) NOT DETECTED   Staphylococcus aureus NOT DETECTED NOT DETECTED   Methicillin resistance NOT DETECTED NOT DETECTED   Streptococcus species NOT DETECTED NOT DETECTED   Streptococcus agalactiae NOT DETECTED NOT DETECTED   Streptococcus pneumoniae NOT DETECTED NOT DETECTED   Streptococcus pyogenes NOT DETECTED NOT DETECTED   Acinetobacter baumannii NOT DETECTED NOT DETECTED   Enterobacteriaceae species NOT DETECTED NOT DETECTED   Enterobacter cloacae complex NOT DETECTED NOT DETECTED   Escherichia coli NOT DETECTED NOT DETECTED   Klebsiella oxytoca NOT DETECTED NOT DETECTED   Klebsiella pneumoniae NOT DETECTED NOT DETECTED   Proteus species NOT DETECTED NOT DETECTED   Serratia marcescens NOT DETECTED NOT DETECTED   Haemophilus influenzae NOT DETECTED NOT DETECTED   Neisseria meningitidis NOT DETECTED NOT DETECTED   Pseudomonas aeruginosa NOT DETECTED NOT DETECTED   Candida albicans NOT DETECTED NOT DETECTED   Candida glabrata NOT DETECTED NOT DETECTED   Candida krusei NOT DETECTED NOT DETECTED   Candida parapsilosis NOT DETECTED NOT DETECTED   Candida tropicalis NOT DETECTED NOT DETECTED    Name of physician (or Provider) Contacted: Chakravartti   Changes to prescribed antibiotics required: Patient currently on vancomycin per pharmacy. No changes to abx at this time. Follow clinical picture and culture data.   Argie Ramming, PharmD Clinical Pharmacist 03/06/17 11:34 PM

## 2017-03-06 NOTE — Care Management Note (Addendum)
Case Management Note  Patient Details  Name: Adam Reyes MRN: [id number] Date of Birth: 1956/04/18  Subjective/Objective:     From home with wife, pta indep, s/p heart cath, will be on plavix.   Has no PCP.  NCM will give patient [facility] information to assist in getting PCP.             Action/Plan: NCM will follow for dc needs.  Expected Discharge Date:                  Expected Discharge Plan:  Home/Self Care  In-House Referral:     Discharge planning Services  CM Consult  Post Acute Care Choice:    Choice offered to:     DME Arranged:    DME Agency:     HH Arranged:    Downs Agency:     Status of Service:  Completed, signed off  If discussed at [employer] of Stay Meetings, dates discussed:    Additional Comments:  Zenon Mayo, RN 03/06/2017, 8:51 AM

## 2017-03-06 NOTE — Progress Notes (Signed)
ANTICOAGULATION CONSULT NOTE - Follow Up Consult  Pharmacy Consult for heparin Indication: chest pain/ACS  No Known Allergies  Patient Measurements: Height: 6\' 4"  (193 cm) Weight: 272 lb 7.8 oz (123.6 kg) IBW/kg (Calculated) : 86.8 Heparin Dosing Weight: 113kg  Vital Signs: Temp: 99.7 F (37.6 C) (08/23 0444) Temp Source: Oral (08/23 0000) BP: 124/60 (08/23 0200) Pulse Rate: 77 (08/23 0200)  Labs:  Recent Labs  03/05/17 0709 03/05/17 0710 03/06/17 0528  HGB  --  14.5 12.7*  HCT  --  41.1 36.3*  PLT  --  92* PENDING  APTT 31  --   --   LABPROT 14.1  --   --   INR 1.08  --   --   HEPARINUNFRC  --   --  <0.10*  CREATININE  --  0.82  --   TROPONINI 4.50*  --   --     Estimated Creatinine Clearance: 139.3 mL/min (by C-G formula based on SCr of 0.82 mg/dL).   Medications:  Prescriptions Prior to Admission  Medication Sig Dispense Refill Last Dose  . Ascorbic Acid (VITAMIN C PO) Take 1 tablet by mouth daily.      Assessment: 61 yo male here with r/o ACS now s/p cath with no culprit lesion noted. Pharmacy consulted to re-start heparin 8 hours post sheath removal with plans to run heparin for a total of 48hrs.   Heparin level is undetectable this morning with no infusion issues per RN. Noted Hgb drop from 14.5 to 12.7 and platelets pending. No bleeding noted.   Goal of Therapy:  Heparin level 0.3-0.7 units/ml Monitor platelets by anticoagulation protocol: Yes   Plan:  Increase heparin gtt to 1300 units/hr  Heparin level in 6 hrs Daily heparin level and CBC Monitor for s/s bleeding  F/u heparin gtt end time   Argie Ramming, PharmD Clinical Pharmacist 03/06/17 6:42 AM

## 2017-03-06 NOTE — Progress Notes (Addendum)
Progress Note  Patient Name: Adam Reyes Date of Encounter: 03/06/2017  Primary Cardiologist: Ellyn Hack  Subjective   Feels OK, no CP, no SOB  Inpatient Medications    Scheduled Meds: . angioplasty book   Does not apply Once  . aspirin  81 mg Oral Daily  . atorvastatin  80 mg Oral q1800  . clopidogrel  75 mg Oral Q breakfast  . metoprolol tartrate  25 mg Oral BID  . sodium chloride flush  3 mL Intravenous Q12H   Continuous Infusions: . sodium chloride 250 mL (03/05/17 1715)  . heparin 1,300 Units/hr (03/06/17 0733)   PRN Meds: sodium chloride, acetaminophen, morphine injection, ondansetron (ZOFRAN) IV, sodium chloride flush   Vital Signs    Vitals:   03/06/17 0000 03/06/17 0200 03/06/17 0444 03/06/17 0722  BP: 135/65 124/60  128/60  Pulse: 71 77  85  Resp: (!) 21 15  (!) 22  Temp: 99.7 F (37.6 C)  99.7 F (37.6 C) 99 F (37.2 C)  TempSrc: Oral   Oral  SpO2: 94% 95%  97%  Weight:   272 lb 7.8 oz (123.6 kg)   Height:        Intake/Output Summary (Last 24 hours) at 03/06/17 0944 Last data filed at 03/06/17 2778  Gross per 24 hour  Intake           714.83 ml  Output             1100 ml  Net          -385.17 ml   Filed Weights   03/05/17 0706 03/06/17 0444  Weight: 275 lb (124.7 kg) 272 lb 7.8 oz (123.6 kg)    Telemetry    No adverse rhythms- Personally Reviewed  ECG    ST changes noted, improved  - Personally Reviewed  Physical Exam   GEN: No acute distress.   Neck: No JVD Cardiac: RRR, no murmurs, rubs, or gallops.  Respiratory: Clear to auscultation bilaterally. GI: Soft, nontender, non-distended  MS: No edema; No deformity. Cath c/d/i Neuro:  Nonfocal  Psych: Normal affect   Labs    Chemistry Recent Labs Lab 03/05/17 0710  NA 133*  K 3.6  CL 98*  CO2 27  GLUCOSE 134*  BUN 14  CREATININE 0.82  CALCIUM 9.0  GFRNONAA >60  GFRAA >60  ANIONGAP 8     Hematology Recent Labs Lab 03/05/17 0710 03/06/17 0528  WBC 2.0* 2.8*    RBC 4.75 4.23  HGB 14.5 12.7*  HCT 41.1 36.3*  MCV 86.5 85.8  MCH 30.5 30.0  MCHC 35.3 35.0  RDW 13.2 13.4  PLT 92* 93*    Cardiac Enzymes Recent Labs Lab 03/05/17 0709  TROPONINI 4.50*    Recent Labs Lab 03/05/17 0712  TROPIPOC 4.03*     BNPNo results for input(s): BNP, PROBNP in the last 168 hours.   DDimer No results for input(s): DDIMER in the last 168 hours.   Radiology    Dg Chest Port 1 View  Result Date: 03/05/2017 CLINICAL DATA:  61 year old male with mid chest pain since yesterday and some shortness of breath. Chronic lymphocytic leukemia EXAM: PORTABLE CHEST 1 VIEW COMPARISON:  Neck CT 06/14/2015 FINDINGS: Portable AP upright view at 0749 hours. Cardiac and mediastinal contours are normal for portable technique. Allowing for portable technique the lungs are clear. Visualized tracheal air column is within normal limits. No pneumothorax. IMPRESSION: Negative portable chest.  No acute cardiopulmonary abnormality. Electronically Signed  By: Genevie Ann M.D.   On: 03/05/2017 08:06    Cardiac Studies   Cath  Prox LAD lesion, 50 %stenosed bifurcation lesion with Ost 1st Diag lesion, 55 %stenosed.  The left ventricular systolic function is normal. The left ventricular ejection fraction is 50-55% by visual estimate.  LV end diastolic pressure is mildly elevated.   Patient had prolonged episodes of chest pain with positive troponin and concern EKG for STEMI involving the lateral wall and possibly inferior wall. Likely culprit for his MI is a diagonal lesion. However upon evaluation angiographically, the lesion is not flow-limiting appeared to be overtly thrombotic in nature.  At this point I think the best course of action would be to treat medically with IV heparin along with aspirin and Plavix and avoid potential bifurcation PCI in the relatively stable patient who is now chest pain-free.  Plan:  Admit to telemetry unit/stepdown for IV heparin for minimum of 48  hours.  TR band removal per protocol  Would treat at least 3 months aspirin and Plavix.  Add statin, and consider beta blocker.   Expected she may for discharge in 2-3 days.   Glenetta Hew, MD Glenetta Hew, M.D., M.S. Interventional Cardiologist    Patient Profile     61 y.o. male chronic B-cell leukemia/small cell lymphoma otherwise relatively healthy sent to cath lab from [facility] with suspected STEMI following CP episode  Assessment & Plan    STEMI  - 1-81mm ST elevation 1, V5-6  - possible diagonal branch culprit. Now CP free. Med mgt  - IV Hep, Plavix, ASA  - Statin Bb  - Possible DC tomorrow.   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

## 2017-03-07 ENCOUNTER — Telehealth: Payer: Self-pay | Admitting: Cardiology

## 2017-03-07 DIAGNOSIS — D709 Neutropenia, unspecified: Secondary | ICD-10-CM

## 2017-03-07 LAB — BASIC METABOLIC PANEL
ANION GAP: 9 (ref 5–15)
BUN: 13 mg/dL (ref 6–20)
CALCIUM: 8.8 mg/dL — AB (ref 8.9–10.3)
CHLORIDE: 101 mmol/L (ref 101–111)
CO2: 27 mmol/L (ref 22–32)
Creatinine, Ser: 0.81 mg/dL (ref 0.61–1.24)
GFR calc Af Amer: >60 mL/min (ref >60)
GFR calc non Af Amer: >60 mL/min (ref >60)
GLUCOSE: 124 mg/dL — AB (ref 65–99)
POTASSIUM: 3.6 mmol/L (ref 3.5–5.1)
Sodium: 137 mmol/L (ref 135–145)

## 2017-03-07 LAB — CBC
HEMATOCRIT: 38 % — AB (ref 39.0–52.0)
Hemoglobin: 13.1 g/dL (ref 13.0–17.0)
MCH: 29.6 pg (ref 26.0–34.0)
MCHC: 34.5 g/dL (ref 30.0–36.0)
MCV: 85.8 fL (ref 78.0–100.0)
Platelets: 109 10*3/uL — ABNORMAL LOW (ref 150–400)
RBC: 4.43 MIL/uL (ref 4.22–5.81)
RDW: 13.4 % (ref 11.5–15.5)
WBC: 2.3 10*3/uL — AB (ref 4.0–10.5)

## 2017-03-07 LAB — HEPARIN LEVEL (UNFRACTIONATED): Heparin Unfractionated: 0.35 IU/mL (ref 0.30–0.70)

## 2017-03-07 MED ORDER — METOPROLOL TARTRATE 25 MG PO TABS: 25.0000 mg | ORAL_TABLET | Freq: Two times a day (BID) | ORAL | 11 refills | Status: DC

## 2017-03-07 MED ORDER — ASPIRIN 81 MG PO CHEW: 81.0000 mg | CHEWABLE_TABLET | Freq: Every day | ORAL | 11 refills | Status: DC

## 2017-03-07 MED ORDER — CLOPIDOGREL BISULFATE 75 MG PO TABS: 75.0000 mg | ORAL_TABLET | Freq: Every day | ORAL | 11 refills | Status: DC

## 2017-03-07 MED ORDER — ATORVASTATIN CALCIUM 80 MG PO TABS: 80.0000 mg | ORAL_TABLET | Freq: Every day | ORAL | 11 refills | Status: DC

## 2017-03-07 NOTE — Discharge Summary (Signed)
Discharge Summary    Patient ID: Adam Reyes,  MRN: [id number], DOB/AGE: 12-Jan-1956 61 y.o.  Admit date: 03/05/2017 Discharge date: 03/07/2017  Primary Care Provider: Patient, No Pcp Per Primary Cardiologist: Dr. Ellyn Hack   Discharge Diagnoses    Principal Problem:   ST elevation myocardial infarction (STEMI) of inferolateral wall, initial episode of care [facility]) Active Problems:   Chronic lymphocytic leukemia (CLL), B-cell ([place])   Neutropenia ([place])   Allergies No Known Allergies   History of Present Illness     Adam Reyes is a 61 year old male with a PMH consistent with chronic B-cell leukemia/small cell Lymphoma. Otherwise he is healthy. He uses chewing tobacco and is a former smoker. He was in his usual state of health until 24 hours prior to presentation when he developed intermittent episodes of chest pain, 5/10 at its worst. He was having associated exertional dyspnea and nausea. No vomiting. He had not had any symptoms of syncope, palpitations, orthopnea, or edema.  He initially presented to [facility] on 8/22 around 7 AM in the morning. His first EKG was non diagnostic with subtle ST segmental changes in the inferolateral leads. His Troponin increased to > 4.0 so his EKG was repeated and showed  1-2 mm elevation in II along with I, V5-V6. A CODE STEMI was activated and he was emergently transferred to [facility] for an Emergent Catheterization.   Hospital Course     Consultants: None  Adam Reyes was in no acute distress on arrival to the cath lab at [facility]. He was found to have proximal LAD 50% stenosed bifurcation lesion with Ost 1st diag, 55% stenosed. Normal EF, LV end diastolic pressure mildly elevated. The cath was performed by Dr. Ellyn Hack who reported that the likely culprit for the MI was a diagonal lesion. However, upon evaluation angiographically, the lesion was not flow limiting and appeared to be overtly thrombotic. No intervention. Dr.  Ellyn Hack felt that the best course of action would be to treat medically with IV heparin along with aspirin, Plavix, statin, and BB. He felt it was best to avoid potential bifurcation PCI in a relatively stable patient who was by that time chest pain-free.   Throughout the remainder of his stay he had no further chest pain, SOB or CP.  He did spike a transient fever on 8/22, blood cultures and a urinalysis was done. He received a one time dose of Vancomycin. Blood culture showed contamination but was otherwise normal. Urinalysis negative for UTI. He was seen by cardiac rehab and will continue cardiac rehab as an outpatient. He was advised to follow-up with Heme Onc as an outpatient. 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  The  catheter site is stable He has been seen by Dr. Marlou Porch today and deemed ready for discharge home. All follow-up appointments have been scheduled.  A work excuse note was provided as well. Discharge medications are listed below.  _____________  Discharge Vitals Blood pressure 112/68, pulse 80, temperature 98.3 F (36.8 C), temperature source Oral, resp. rate 20, height 6\' 4"  (1.93 m), weight 261 lb 3.9 oz (118.5 kg), SpO2 95 %.  Filed Weights   03/05/17 0706 03/06/17 0444 03/07/17 0514  Weight: 275 lb (124.7 kg) 272 lb 7.8 oz (123.6 kg) 261 lb 3.9 oz (118.5 kg)    Labs &  Radiologic Studies     CBC  Recent Labs  03/05/17 0710 03/06/17 0528 03/07/17 0533  WBC 2.0* 2.8* 2.3*  NEUTROABS 0.0*  --   --   HGB 14.5 12.7* 13.1  HCT 41.1 36.3* 38.0*  MCV 86.5 85.8 85.8  PLT 92* 93* 970*   Basic Metabolic Panel  Recent Labs  03/05/17 0710 03/07/17 0533  NA 133* 137  K 3.6 3.6  CL 98* 101  CO2 27 27  GLUCOSE 134* 124*  BUN 14 13  CREATININE 0.82 0.81  CALCIUM 9.0 8.8*    Cardiac Enzymes  Recent Labs  03/05/17 0709  TROPONINI 4.50*     Dg Chest  Port 1 View  Result Date: 03/05/2017 CLINICAL DATA:  61 year old male with mid chest pain since yesterday and some shortness of breath. Chronic lymphocytic leukemia EXAM: PORTABLE CHEST 1 VIEW COMPARISON:  Neck CT 06/14/2015 FINDINGS: Portable AP upright view at 0749 hours. Cardiac and mediastinal contours are normal for portable technique. Allowing for portable technique the lungs are clear. Visualized tracheal air column is within normal limits. No pneumothorax. IMPRESSION: Negative portable chest.  No acute cardiopulmonary abnormality. Electronically Signed   By: Genevie Ann M.D.   On: 03/05/2017 08:06     Diagnostic Studies/Procedures    Cardiac Catheterization 03/05/2017  Procedures   LEFT HEART CATH AND CORONARY ANGIOGRAPHY  Conclusion     Prox LAD lesion, 50 %stenosed bifurcation lesion with Ost 1st Diag lesion, 55 %stenosed.  The left ventricular systolic function is normal. The left ventricular ejection fraction is 50-55% by visual estimate.  LV end diastolic pressure is mildly elevated.   Patient had prolonged episodes of chest pain with positive troponin and concern EKG for STEMI involving the lateral wall and possibly inferior wall. Likely culprit for his MI is a diagonal lesion. However upon evaluation angiographically, the lesion is not flow-limiting appeared to be overtly thrombotic in nature.  At this point I think the best course of action would be to treat medically with IV heparin along with aspirin and Plavix and avoid potential bifurcation PCI in the relatively stable patient who is now chest pain-free.  Plan:  Admit to telemetry unit/stepdown for IV heparin for minimum of 48 hours.  TR band removal per protocol  Would treat at least 3 months aspirin and Plavix.  Add statin, and consider beta blocker.   Expected she may for discharge in 2-3 days.      _____________    Disposition   Pt is being discharged home today in good condition.  Follow-up  Plans & Appointments    Follow-up Information    [place], Kelby Fam, MD. Call.   Specialties:  Hematology and Oncology, Oncology Why:  to arrange a follow-up appointment to be seen within the next 1-2 weeks       Almyra Deforest, [place]. Go on 03/18/2017.   Specialties:  Cardiology, Radiology Why:  Your appointment time is at 11am, please arrive 10-15 minutes early. Contact information: [address] [place] [address] [phone]          Discharge Instructions    Diet - low sodium heart healthy    Complete by:  As directed    Increase activity slowly    Complete by:  As directed    May shower / Bathe    Complete by:  As directed       Discharge Medications   Allergies as of 03/07/2017   No Known Allergies  Medication List    TAKE these medications   aspirin 81 MG chewable tablet Chew 1 tablet (81 mg total) by mouth daily.   atorvastatin 80 MG tablet Commonly known as:  LIPITOR Take 1 tablet (80 mg total) by mouth daily at 6 PM.   clopidogrel 75 MG tablet Commonly known as:  PLAVIX Take 1 tablet (75 mg total) by mouth daily with breakfast.   metoprolol tartrate 25 MG tablet Commonly known as:  LOPRESSOR Take 1 tablet (25 mg total) by mouth 2 (two) times daily.   VITAMIN C PO Take 1 tablet by mouth daily.            Discharge Care Instructions        Start     Ordered   03/08/17 0000  aspirin 81 MG chewable tablet  Daily    Question:  Supervising Provider  Answer:  Sueanne Margarita   03/07/17 1041   03/08/17 0000  clopidogrel (PLAVIX) 75 MG tablet  Daily with breakfast    Question:  Supervising Provider  Answer:  Fransico Him R   03/07/17 1041   03/07/17 0000  atorvastatin (LIPITOR) 80 MG tablet  Daily-1800    Question:  Supervising Provider  Answer:  Fransico Him R   03/07/17 1041   03/07/17 0000  metoprolol tartrate (LOPRESSOR) 25 MG tablet  2 times daily    Question:  Supervising Provider  Answer:  Sueanne Margarita   03/07/17  1041   03/07/17 0000  Increase activity slowly     03/07/17 1048   03/07/17 0000  May shower / Bathe     03/07/17 1048   03/07/17 0000  Diet - low sodium heart healthy     03/07/17 1048       Aspirin prescribed at discharge?  Yes High Intensity Statin Prescribed? (Lipitor 40-80mg  or Crestor 20-40mg ): Yes Beta Blocker Prescribed? Yes For EF 45% or less, Was ACEI/ARB Prescribed? EF normal ADP Receptor Inhibitor Prescribed? (i.e. Plavix etc.-Includes Medically Managed Patients): Yes For EF <45%, Aldosterone Inhibitor Prescribed? EF Normal Was EF assessed during THIS hospitalization? Yes Was Cardiac Rehab II ordered? (Included Medically managed Patients):yes   Outstanding Labs/Studies     Duration of Discharge Encounter   Greater than 30 minutes including physician time.  Kristopher Glee PA-C 03/07/2017, 11:33 AM   Personally seen and examined. Agree with above. Primary Cardiologist: Ellyn Hack  Subjective   Feels OK, no CP, no SOB. Mild itch on back.   Inpatient Medications    Scheduled Meds: . angioplasty book   Does not apply Once  . aspirin  81 mg Oral Daily  . atorvastatin  80 mg Oral q1800  . clopidogrel  75 mg Oral Q breakfast  . metoprolol tartrate  25 mg Oral BID  . sodium chloride flush  3 mL Intravenous Q12H   Continuous Infusions: . sodium chloride 250 mL (03/05/17 1715)  . heparin 1,950 Units/hr (03/07/17 0520)  . vancomycin     PRN Meds: sodium chloride, acetaminophen, morphine injection, ondansetron (ZOFRAN) IV, sodium chloride flush   Vital Signs          Vitals:   03/06/17 2100 03/07/17 0011 03/07/17 0514 03/07/17 0811  BP:  114/71  112/68  Pulse:  75 80 80  Resp:   20   Temp: 98.7 F (37.1 C) 98.1 F (36.7 C) 98.3 F (36.8 C)   TempSrc: Oral Oral Oral   SpO2:  96% 95%   Weight:   261 lb  3.9 oz (118.5 kg)   Height:        Intake/Output Summary (Last 24 hours) at 03/07/17 0947 Last data filed at  03/07/17 0900  Gross per 24 hour  Intake          1002.36 ml  Output             3625 ml  Net         -2622.64 ml        Filed Weights   03/05/17 0706 03/06/17 0444 03/07/17 0514  Weight: 275 lb (124.7 kg) 272 lb 7.8 oz (123.6 kg) 261 lb 3.9 oz (118.5 kg)    Telemetry    No adverse rhythms, atrial bigeminy noted- Personally Reviewed  ECG    ST changes noted, improved  - Personally Reviewed  Physical Exam   GEN: Well nourished, well developed, in no acute distress  HEENT: normal  Neck: no JVD, carotid bruits, or masses Cardiac: RRR; no murmurs, rubs, or gallops,no edema  Respiratory:  clear to auscultation bilaterally, normal work of breathing GI: soft, nontender, nondistended, + BS MS: no deformity or atrophy cath site normal Skin: mild redness on back mostly between shoulder, mildly puritic Neuro:  Alert and Oriented x 3, Strength and sensation are intact Psych: euthymic mood, full affect   Labs    Chemistry  Last Labs    Recent Labs Lab 03/05/17 0710 03/07/17 0533  NA 133* 137  K 3.6 3.6  CL 98* 101  CO2 27 27  GLUCOSE 134* 124*  BUN 14 13  CREATININE 0.82 0.81  CALCIUM 9.0 8.8*  GFRNONAA >60 >60  GFRAA >60 >60  ANIONGAP 8 9       Hematology  Last Labs    Recent Labs Lab 03/05/17 0710 03/06/17 0528 03/07/17 0533  WBC 2.0* 2.8* 2.3*  RBC 4.75 4.23 4.43  HGB 14.5 12.7* 13.1  HCT 41.1 36.3* 38.0*  MCV 86.5 85.8 85.8  MCH 30.5 30.0 29.6  MCHC 35.3 35.0 34.5  RDW 13.2 13.4 13.4  PLT 92* 93* 109*      Cardiac Enzymes  Last Labs    Recent Labs Lab 03/05/17 0709  TROPONINI 4.50*       Last Labs    Recent Labs Lab 03/05/17 0712  TROPIPOC 4.03*       BNP Last Labs   No results for input(s): BNP, PROBNP in the last 168 hours.     DDimer  Last Labs   No results for input(s): DDIMER in the last 168 hours.     Radiology    Imaging Results (Last 48 hours)  No results found.    Cardiac Studies     Cath  Prox LAD lesion, 50 %stenosed bifurcation lesion with Ost 1st Diag lesion, 55 %stenosed.  The left ventricular systolic function is normal. The left ventricular ejection fraction is 50-55% by visual estimate.  LV end diastolic pressure is mildly elevated.  Patient had prolonged episodes of chest pain with positive troponin and concern EKG for STEMI involving the lateral wall and possibly inferior wall. Likely culprit for his MI is a diagonal lesion. However upon evaluation angiographically, the lesion is not flow-limiting appeared to be overtly thrombotic in nature.  At this point I think the best course of action would be to treat medically with IV heparin along with aspirin and Plavix and avoid potential bifurcation PCI in the relatively stable patient who is now chest pain-free.  Plan:   Would treat at least  3 months aspirin and Plavix.  Add statin, and consider beta blocker.   Glenetta Hew, MD Glenetta Hew, M.D., M.S. Interventional Cardiologist    Patient Profile     61 y.o. male chronic B-cell leukemia/small cell lymphoma otherwise relatively healthy sent to cath lab from [facility] with suspected STEMI following CP episode  Assessment & Plan    STEMI  - 1-81mm ST elevation 1, V5-6, normal EF  - possible diagonal branch culprit. Now CP free. Med mgt  - IV Hep, Plavix, ASA. If rash on back worsens, may need to change to Effient.  - Statin Bb. Will help with atrial bigeminy  - OK for DC  - cardiac rehab.    Leukemia with neutopenia  - transient fever on 8/22  - blood cx drawn and Coag neg staph - contaminant. Got a dose of Vanc.  - No further abx.   - encouraged close follow up as well with Heme/Onc at [facility].   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 Have follow up TOC in 1 week with Ellyn Hack or APP. OK for DC  Signed, Candee Furbish, MD

## 2017-03-07 NOTE — Progress Notes (Signed)
CARDIAC REHAB PHASE I   PRE:  Rate/Rhythm: 77 SR  BP:  Sitting: 125/72    MODE:  Ambulation: 750 ft   POST:  Rate/Rhythm: 84 SR  BP:  Sitting: 138/80       Pt ambulated  750 ft on RA, independent, steady gait, tolerated well with no complaints. Completed MI education with pt and wife at bedside.  Reviewed risk factors, MI book, anti-platelet therapy, activity restrictions, ntg, exercise, heart healthy diet and phase 2 cardiac rehab. Pt verbalized understanding, receptive to education. Pt agrees to phase 2 cardiac rehab referral, will send to [place] per pt request. Pt to bed per pt request after walk, call bell within reach.  [phone]   Adam Sciara, RN, BSN 03/07/2017 1:57 PM

## 2017-03-07 NOTE — Telephone Encounter (Signed)
L/m v/m

## 2017-03-07 NOTE — Telephone Encounter (Signed)
New Message    [facility]  Almyra Deforest  9/14 11a

## 2017-03-07 NOTE — Discharge Instructions (Signed)
Aspirin and Your Heart Aspirin is a medicine that affects the way blood clots. Aspirin can be used to help reduce the risk of blood clots, heart attacks, and other heart-related problems. Should I take aspirin? Your health care provider will help you determine whether it is safe and beneficial for you to take aspirin daily. Taking aspirin daily may be beneficial if you:  Have had a heart attack or chest pain.  Have undergone open heart surgery such as coronary artery bypass surgery (CABG).  Have had coronary angioplasty.  Have experienced a stroke or transient ischemic attack (TIA).  Have peripheral vascular disease (PVD).  Have chronic heart rhythm problems such as atrial fibrillation.  Are there any risks of taking aspirin daily? Daily use of aspirin can increase your risk of side effects. Some of these include:  Bleeding. Bleeding problems can be minor or serious. An example of a minor problem is a cut that does not stop bleeding. An example of a more serious problem is stomach bleeding or bleeding into the brain. Your risk of bleeding is increased if you are also taking non-steroidal anti-inflammatory medicine (NSAIDs).  Increased bruising.  Upset stomach.  An allergic reaction. People who have nasal polyps have an increased risk of developing an aspirin allergy.  What are some guidelines I should follow when taking aspirin?  Take aspirin only as directed by your health care provider. Make sure you understand how much you should take and what form you should take. The two forms of aspirin are: ? Non-enteric-coated. This type of aspirin does not have a coating and is absorbed quickly. Non-enteric-coated aspirin is usually recommended for people with chest pain. This type of aspirin also comes in a chewable form. ? Enteric-coated. This type of aspirin has a special coating that releases the medicine very slowly. Enteric-coated aspirin causes less stomach upset than  non-enteric-coated aspirin. This type of aspirin should not be chewed or crushed.  Drink alcohol in moderation. Drinking alcohol increases your risk of bleeding. When should I seek medical care?  You have unusual bleeding or bruising.  You have stomach pain.  You have an allergic reaction. Symptoms of an allergic reaction include: ? Hives. ? Itchy skin. ? Swelling of the lips, tongue, or face.  You have ringing in your ears. When should I seek immediate medical care?  Your bowel movements are bloody, dark red, or black in color.  You vomit or cough up blood.  You have blood in your urine.  You cough, wheeze, or feel short of breath. If you have any of the following symptoms, this is an emergency. Do not wait to see if the pain will go away. Get medical help at once. Call your local emergency services (911 in the [place] Do not drive yourself to the hospital.  You have severe chest pain, especially if the pain is crushing or pressure-like and spreads to the arms, back, neck, or jaw.  You have stroke-like symptoms, such as: ? Loss of vision. ? Difficulty talking. ? Numbness or weakness on one side of your body. ? Numbness or weakness in your arm or leg. ? Not thinking clearly or feeling confused.  Coronary Angiogram With Stent, Care After This sheet gives you information about how to care for yourself after your procedure. Your health care provider may also give you more specific instructions. If you have problems or questions, contact your health care provider. What can I expect after the procedure? After your procedure, it is common to  have:  Bruising in the area where a small, thin tube (catheter) was inserted. This usually fades within 1-2 weeks.  Blood collecting in the tissue (hematoma) that may be painful to the touch. It should usually decrease in size and tenderness within 1-2 weeks.  Follow these instructions at home: Insertion area care  Do not take baths, swim,  or use a hot tub until your health care provider approves.  You may shower 24-48 hours after the procedure or as directed by your health care provider.  Follow instructions from your health care provider about how to take care of your incision. Make sure you: ? Wash your hands with soap and water before you change your bandage (dressing). If soap and water are not available, use hand sanitizer. ? Change your dressing as told by your health care provider. ? Leave stitches (sutures), skin glue, or adhesive strips in place. These skin closures may need to stay in place for 2 weeks or longer. If adhesive strip edges start to loosen and curl up, you may trim the loose edges. Do not remove adhesive strips completely unless your health care provider tells you to do that.  Remove the bandage (dressing) and gently wash the catheter insertion site with plain soap and water.  Pat the area dry with a clean towel. Do not rub the area, because that may cause bleeding.  Do not apply powder or lotion to the incision area.  Check your incision area every day for signs of infection. Check for: ? More redness, swelling, or pain. ? More fluid or blood. ? Warmth. ? Pus or a bad smell. Activity  Do not drive for 24 hours if you were given a medicine to help you relax (sedative).  Do not lift anything that is heavier than 10 lb (4.5 kg) for 5 days after your procedure or as directed by your health care provider.  Ask your health care provider when it is okay for you: ? To return to work or school. ? To resume usual physical activities or sports. ? To resume sexual activity. Eating and drinking  Eat a heart-healthy diet. This should include plenty of fresh fruits and vegetables.  Avoid the following types of food: ? Food that is high in salt. ? Canned or highly processed food. ? Food that is high in saturated fat or sugar. ? [employer].  Limit alcohol intake to no more than 1 drink a day for  non-pregnant women and 2 drinks a day for men. One drink equals 12 oz of beer, 5 oz of wine, or 1 oz of hard liquor. Lifestyle  Do not use any products that contain nicotine or tobacco, such as cigarettes and e-cigarettes. If you need help quitting, ask your health care provider.  Take steps to manage and control your weight.  Get regular exercise.  Manage your blood pressure.  Manage other health problems, such as diabetes. General instructions  Take over-the-counter and prescription medicines only as told by your health care provider. Blood thinners may be prescribed after your procedure to improve blood flow through the stent.  If you need an MRI after your heart stent has been placed, be sure to tell the health care provider who orders the MRI that you have a heart stent.  Keep all follow-up visits as directed by your health care provider. This is important. Contact a health care provider if:  You have a fever.  You have chills.  You have increased bleeding from the catheter  insertion area. Hold pressure on the area. Get help right away if:  You develop chest pain or shortness of breath.  You feel faint or you pass out.  You have unusual pain at the catheter insertion area.  You have redness, warmth, or swelling at the catheter insertion area.  You have drainage (other than a small amount of blood on the dressing) from the catheter insertion area.  The catheter insertion area is bleeding, and the bleeding does not stop after 30 minutes of holding steady pressure on the area.  You develop bleeding from any other place, such as from your rectum. There may be bright red blood in your urine or stool, or it may appear as black, tarry stool. This information is not intended to replace advice given to you by your health care provider. Make sure you discuss any questions you have with your health care provider. Document Released: 01/18/2005 Document Revised: 03/28/2016 Document  Reviewed: 03/28/2016 Elsevier Interactive Patient Education  [employer].  This information is not intended to replace advice given to you by your health care provider. Make sure you discuss any questions you have with your health care provider. Document Released: 06/13/2008 Document Revised: 11/08/2015 Document Reviewed: 10/06/2013 Elsevier Interactive Patient Education  2018 [employer].

## 2017-03-07 NOTE — Telephone Encounter (Signed)
  Patient contacted regarding discharge from 03/05/2017 - 03/07/2017 (2 days), [facility]. Patient understands to follow up with provider  Almyra Deforest on 03-18-17 11a at [facility] office. Patient understands discharge instructions? yes Patient understands medications and regiment? Yes-he verbalized his medication and wen next doses were. Patient understands to bring all medications to this visit? Yes.  Pt states that he has no further questions at this time. He will call if he has any questions.

## 2017-03-07 NOTE — Progress Notes (Addendum)
Progress Note  Patient Name: Adam Reyes Date of Encounter: 03/07/2017  Primary Cardiologist: Ellyn Hack  Subjective   Feels OK, no CP, no SOB. Mild itch on back.   Inpatient Medications    Scheduled Meds: . angioplasty book   Does not apply Once  . aspirin  81 mg Oral Daily  . atorvastatin  80 mg Oral q1800  . clopidogrel  75 mg Oral Q breakfast  . metoprolol tartrate  25 mg Oral BID  . sodium chloride flush  3 mL Intravenous Q12H   Continuous Infusions: . sodium chloride 250 mL (03/05/17 1715)  . heparin 1,950 Units/hr (03/07/17 0520)  . vancomycin     PRN Meds: sodium chloride, acetaminophen, morphine injection, ondansetron (ZOFRAN) IV, sodium chloride flush   Vital Signs    Vitals:   03/06/17 2100 03/07/17 0011 03/07/17 0514 03/07/17 0811  BP:  114/71  112/68  Pulse:  75 80 80  Resp:   20   Temp: 98.7 F (37.1 C) 98.1 F (36.7 C) 98.3 F (36.8 C)   TempSrc: Oral Oral Oral   SpO2:  96% 95%   Weight:   261 lb 3.9 oz (118.5 kg)   Height:        Intake/Output Summary (Last 24 hours) at 03/07/17 0947 Last data filed at 03/07/17 0900  Gross per 24 hour  Intake          1002.36 ml  Output             3625 ml  Net         -2622.64 ml   Filed Weights   03/05/17 0706 03/06/17 0444 03/07/17 0514  Weight: 275 lb (124.7 kg) 272 lb 7.8 oz (123.6 kg) 261 lb 3.9 oz (118.5 kg)    Telemetry    No adverse rhythms, atrial bigeminy noted- Personally Reviewed  ECG    ST changes noted, improved  - Personally Reviewed  Physical Exam   GEN: Well nourished, well developed, in no acute distress  HEENT: normal  Neck: no JVD, carotid bruits, or masses Cardiac: RRR; no murmurs, rubs, or gallops,no edema  Respiratory:  clear to auscultation bilaterally, normal work of breathing GI: soft, nontender, nondistended, + BS MS: no deformity or atrophy cath site normal Skin: mild redness on back mostly between shoulder, mildly puritic Neuro:  Alert and Oriented x 3,  Strength and sensation are intact Psych: euthymic mood, full affect   Labs    Chemistry  Recent Labs Lab 03/05/17 0710 03/07/17 0533  NA 133* 137  K 3.6 3.6  CL 98* 101  CO2 27 27  GLUCOSE 134* 124*  BUN 14 13  CREATININE 0.82 0.81  CALCIUM 9.0 8.8*  GFRNONAA >60 >60  GFRAA >60 >60  ANIONGAP 8 9     Hematology  Recent Labs Lab 03/05/17 0710 03/06/17 0528 03/07/17 0533  WBC 2.0* 2.8* 2.3*  RBC 4.75 4.23 4.43  HGB 14.5 12.7* 13.1  HCT 41.1 36.3* 38.0*  MCV 86.5 85.8 85.8  MCH 30.5 30.0 29.6  MCHC 35.3 35.0 34.5  RDW 13.2 13.4 13.4  PLT 92* 93* 109*    Cardiac Enzymes  Recent Labs Lab 03/05/17 0709  TROPONINI 4.50*     Recent Labs Lab 03/05/17 0712  TROPIPOC 4.03*     BNPNo results for input(s): BNP, PROBNP in the last 168 hours.   DDimer No results for input(s): DDIMER in the last 168 hours.   Radiology    No results found.  Cardiac Studies   Cath  Prox LAD lesion, 50 %stenosed bifurcation lesion with Ost 1st Diag lesion, 55 %stenosed.  The left ventricular systolic function is normal. The left ventricular ejection fraction is 50-55% by visual estimate.  LV end diastolic pressure is mildly elevated.   Patient had prolonged episodes of chest pain with positive troponin and concern EKG for STEMI involving the lateral wall and possibly inferior wall. Likely culprit for his MI is a diagonal lesion. However upon evaluation angiographically, the lesion is not flow-limiting appeared to be overtly thrombotic in nature.  At this point I think the best course of action would be to treat medically with IV heparin along with aspirin and Plavix and avoid potential bifurcation PCI in the relatively stable patient who is now chest pain-free.  Plan:   Would treat at least 3 months aspirin and Plavix.  Add statin, and consider beta blocker.   Glenetta Hew, MD Glenetta Hew, M.D., M.S. Interventional Cardiologist    Patient Profile     61  y.o. male chronic B-cell leukemia/small cell lymphoma otherwise relatively healthy sent to cath lab from [facility] with suspected STEMI following CP episode  Assessment & Plan    STEMI  - 1-59mm ST elevation 1, V5-6, normal EF  - possible diagonal branch culprit. Now CP free. Med mgt  - IV Hep, Plavix, ASA. If rash on back worsens, may need to change to Effient.  - Statin Bb. Will help with atrial bigeminy  - OK for DC  - cardiac rehab.    Leukemia with neutopenia  - transient fever on 8/22  - blood cx drawn and Coag neg staph - contaminant. Got a dose of Vanc.  - No further abx.   - encouraged close follow up as well with Heme/Onc at [facility].   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 Have follow up TOC in 1 week with Ellyn Hack or APP. OK for DC  Signed, Candee Furbish, MD  03/07/2017, 9:47 AM

## 2017-03-10 ENCOUNTER — Telehealth (HOSPITAL_COMMUNITY): Payer: Self-pay | Admitting: *Deleted

## 2017-03-10 LAB — CULTURE, BLOOD (ROUTINE X 2): CULTURE: NO GROWTH

## 2017-03-10 NOTE — Progress Notes (Signed)
Is this the contaminant you mentioned in progress note 03/07/2017?

## 2017-03-11 ENCOUNTER — Telehealth: Payer: Self-pay | Admitting: *Deleted

## 2017-03-11 DIAGNOSIS — D709 Neutropenia, unspecified: Secondary | ICD-10-CM

## 2017-03-11 DIAGNOSIS — Z09 Encounter for follow-up examination after completed treatment for conditions other than malignant neoplasm: Secondary | ICD-10-CM

## 2017-03-11 NOTE — Telephone Encounter (Signed)
-----   Message from [place], [place] sent at 03/11/2017  8:28 AM EDT ----- Discussed with Dr. Marlou Porch who also discussed with infectious disease doctor, current recommendation is to repeat blood culture x 2, if negative no further workup, if positive, will need workup for bacteremia. Unclear if this is contaminant.

## 2017-03-11 NOTE — Telephone Encounter (Signed)
Advised patient on repeat labwork, orders created for [facility] for collection site of preference, pt aware to call if further needs, and that we will be in touch to advise further once preliminary labwork is reviewed. Also confirmed upcoming appt.

## 2017-03-11 NOTE — Progress Notes (Signed)
Discussed with Dr. Marlou Porch who also discussed with infectious disease doctor, current recommendation is to repeat blood culture x 2, if negative no further workup, if positive, will need workup for bacteremia. Unclear if this is contaminant.

## 2017-03-13 ENCOUNTER — Telehealth: Payer: Self-pay | Admitting: Physician Assistant

## 2017-03-13 NOTE — Telephone Encounter (Signed)
Received [employer] Short Term Disability Form via Fax.  No Signed AUTH/Check to [employer].  Sent to [place] [facility] [place] for letter to be sent to patient. Sent by Courier 03/13/17 (To be delivered 03/14/17)  lp

## 2017-03-17 LAB — CULTURE, BLOOD (SINGLE)
Organism ID, Bacteria: NO GROWTH
Organism ID, Bacteria: NO GROWTH

## 2017-03-18 ENCOUNTER — Encounter: Payer: Self-pay | Admitting: Physician Assistant

## 2017-03-18 ENCOUNTER — Encounter: Payer: Self-pay | Admitting: *Deleted

## 2017-03-18 ENCOUNTER — Ambulatory Visit (INDEPENDENT_AMBULATORY_CARE_PROVIDER_SITE_OTHER): Payer: 59 | Admitting: Physician Assistant

## 2017-03-18 VITALS — BP 120/78 | HR 68 | Ht 76.0 in | Wt 260.0 lb

## 2017-03-18 DIAGNOSIS — R899 Unspecified abnormal finding in specimens from other organs, systems and tissues: Secondary | ICD-10-CM | POA: Diagnosis not present

## 2017-03-18 DIAGNOSIS — Z79899 Other long term (current) drug therapy: Secondary | ICD-10-CM

## 2017-03-18 DIAGNOSIS — C8590 Non-Hodgkin lymphoma, unspecified, unspecified site: Secondary | ICD-10-CM | POA: Diagnosis not present

## 2017-03-18 DIAGNOSIS — I2510 Atherosclerotic heart disease of native coronary artery without angina pectoris: Secondary | ICD-10-CM

## 2017-03-18 NOTE — Progress Notes (Signed)
Yes, I have discussed with him this morning

## 2017-03-18 NOTE — Progress Notes (Signed)
Cardiology Office Note    Date:  03/18/2017   ID:  Adam Reyes, DOB [birth year], MRN [id number]  PCP:  Patient, No Pcp Per  Cardiologist:  Dr. Ellyn Hack   Chief Complaint  Patient presents with  . Follow-up    seen for Dr. Ellyn Hack.     History of Present Illness:  Adam Reyes is a 61 y.o. male with PMH of chronic B-cell leukemia/small cell lymphoma. He recently went to [facility] on 03/05/2017 with chest pain. Initial EKG was nondiagnostic with subtle inferolateral ST changes. This became concerning when his initial troponin level went to greater than 4. Repeat EKG showed potential concerning 1-2 mm elevation in the lead I, II, V5 and V6. Code STEMI was called and he was taken urgently to the cath lab. He had diffuse mild LAD disease proximally with 50% irregular lesion involving ostial D1, EF 55% with no obvious regional abnormality. He was placed on aspirin and Plavix. Post cath, overnight he did have an episode of fever. Given history cancer, urinalysis and blood culture were ordered. Only one the blood culture came back positive with Staph Legdonesis. I discussed this finding with Dr. Marlou Porch who also discussed with Dr. Linus Salmons with infectious disease. It was recommended to repeat blood culture, if positive, will need further evaluation and a TEE, if negative and the patient denies any symptom, then no further workup is needed. The repeat blood culture were both negative after 5 days.  He presents today for follow-up. He did have a rash initially after discharge, this has resolved. He is currently on aspirin, Plavix, metoprolol and Lipitor. He is tolerating the medication without any side effect. He has been walking along with his wife at home without any exertional chest discomfort or shortness of breath. He is able to return to work at this time without any restrictions. Otherwise, I did review his cath report with him. He will need a fasting lipid panel and liver function test in  regional area in 6 weeks. He can follow-up with Dr. Ellyn Hack in 3-4 months. Otherwise he denies any lower extremity edema, orthopnea or PND. He does not have any heart murmur on physical exam, therefore given recent normal EF 50-55% on cath, I do not think he needs echocardiogram at this time.   Past Medical History:  Diagnosis Date  . 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 Enlarged lymph node    left neck  . STEMI (ST elevation myocardial infarction) ([place]) 03/05/2017   hx/notes 03/05/2017    Past Surgical History:  Procedure Laterality Date  . CARDIAC CATHETERIZATION    . COLONOSCOPY N/A 10/11/2015   Procedure: COLONOSCOPY;  Surgeon: Rogene Houston, MD;  [facility];  Service: Endoscopy;  Laterality: N/A;  10/11/2015  . LEFT HEART CATH AND CORONARY ANGIOGRAPHY N/A 03/05/2017   Procedure: LEFT HEART CATH AND CORONARY ANGIOGRAPHY;  Surgeon: Leonie Man, MD;  Location: [place] [facility] LAB;  Service: Cardiovascular;  Laterality: N/A;  . MASS BIOPSY Left 06/27/2015   Procedure: OPEN LEFT NECK BIOPSY ;  Surgeon: Leta Baptist, MD;  Location: [place];  Service: ENT;  Laterality: Left;  . REFRACTIVE SURGERY Bilateral     Current Medications: Outpatient Medications Prior to Visit  Medication Sig Dispense Refill  . Ascorbic Acid (VITAMIN C PO) Take 1 tablet by mouth daily.    [name] aspirin 81 MG chewable tablet Chew 1 tablet (81 mg total)  by mouth daily. 30 tablet 11  . atorvastatin (LIPITOR) 80 MG tablet Take 1 tablet (80 mg total) by mouth daily at 6 PM. 30 tablet 11  . clopidogrel (PLAVIX) 75 MG tablet Take 1 tablet (75 mg total) by mouth daily with breakfast. 30 tablet 11  . metoprolol tartrate (LOPRESSOR) 25 MG tablet Take 1 tablet (25 mg total) by mouth 2 (two) times daily. 60 tablet 11   No facility-administered medications prior to visit.      Allergies:   Patient has no known allergies.   Social History   Social History   . Marital status: Married    Spouse name: N/A  . Number of children: N/A  . Years of education: N/A   Social History Main Topics  . Smoking status: Former Smoker    Years: 2.00    Types: Cigarettes  . Smokeless tobacco: Former Systems developer    Types: Home, [place]: 03/06/2017 "quit smoking when I was young; quit chew/snuff in ~ 2008"  . Alcohol use Yes     Comment: 03/06/2017 "maybe 6 pack beer/month"  . Drug use: No  . Sexual activity: Yes   Other Topics Concern  . None   Social History Narrative  . None     Family History:  The patient's family history includes Cancer in his brother and father; Diabetes in his mother.   ROS:   Please see the history of present illness.    ROS All other systems reviewed and are negative.   PHYSICAL EXAM:   VS:  BP 120/78   Pulse 68   Ht 6\' 4"  (1.93 m)   Wt 260 lb (117.9 kg)   BMI 31.65 kg/m    GEN: Well nourished, well developed, in no acute distress  HEENT: normal  Neck: no JVD, carotid bruits, or masses Cardiac: RRR; no murmurs, rubs, or gallops,no edema  Respiratory:  clear to auscultation bilaterally, normal work of breathing GI: soft, nontender, nondistended, + BS MS: no deformity or atrophy  Skin: warm and dry, no rash Neuro:  Alert and Oriented x 3, Strength and sensation are intact Psych: euthymic mood, full affect  Wt Readings from Last 3 Encounters:  03/18/17 260 lb (117.9 kg)  03/07/17 261 lb 3.9 oz (118.5 kg)  12/12/16 275 lb 9.6 oz (125 kg)      Studies/Labs Reviewed:   EKG:  EKG is not ordered today.    Recent Labs: 12/12/2016: ALT 30 03/07/2017: BUN 13; Creatinine, Ser 0.81; Hemoglobin 13.1; Platelets 109; Potassium 3.6; Sodium 137   Lipid Panel No results found for: CHOL, TRIG, HDL, CHOLHDL, VLDL, LDLCALC, LDLDIRECT  Additional studies/ records that were reviewed today include:   Conclusion     Prox LAD lesion, 50 %stenosed bifurcation lesion with Ost 1st Diag lesion, 55 %stenosed.  The left  ventricular systolic function is normal. The left ventricular ejection fraction is 50-55% by visual estimate.  LV end diastolic pressure is mildly elevated.   Patient had prolonged episodes of chest pain with positive troponin and concern EKG for STEMI involving the lateral wall and possibly inferior wall. Likely culprit for his MI is a diagonal lesion. However upon evaluation angiographically, the lesion is not flow-limiting appeared to be overtly thrombotic in nature.  At this point I think the best course of action would be to treat medically with IV heparin along with aspirin and Plavix and avoid potential bifurcation PCI in the relatively stable patient who is now chest pain-free.  Plan:  Admit to telemetry unit/stepdown for IV heparin for minimum of 48 hours.  TR band removal per protocol  Would treat at least 3 months aspirin and Plavix.  Add statin, and consider beta blocker.      ASSESSMENT:    1. Coronary artery disease involving native coronary artery of native heart without angina pectoris   2. Medication management   3. Lymphoma, unspecified body region, unspecified lymphoma type ([place])   4. Abnormal laboratory test result      PLAN:  In order of problems listed above:  1. CAD: Mildly moderate disease in the ostial diagonal, unclear cause for recent significant the elevated troponin. EF appears to be normal calf. He is symptom has completely resolved. No additional workup is warranted at this time. He can return to work. He will require 6 weeks fasting lipid panel and liver function test. Continue high-dose statin. He is currently on aspirin and Plavix given recent elevated troponin. Continue metoprolol tartrate.  2. B-cell lymphoma: Followed by hematology service  3. Abnormal lab: While in the previously ordered blood culture came back positive, however the species is very unusual and are most likely contaminant. After discussing with ID, we have obtained 2 more  blood cultures which came back negative after 5 days. No additional workup is needed at this time.    Medication Adjustments/Labs and Tests Ordered: Current medicines are reviewed at length with the patient today.  Concerns regarding medicines are outlined above.  Medication changes, Labs and Tests ordered today are listed in the Patient Instructions below. Patient Instructions  Medication Instructions: Your physician recommends that you continue on your current medications as directed. Please refer to the Current Medication list given to you today.  If you need a refill on your cardiac medications before your next appointment, please call your pharmacy.   Labwork: Your physician recommends that you return for lab work in: 6 weeks for a fasting Lipid and Hepatic lab   Follow-Up: Your physician wants you to follow-up in: 3-4 months with Dr. Ellyn Hack. You will receive a reminder letter in the mail two months in advance. If you don't receive a letter, please call our office to schedule this follow-up appointment.   Thank you for choosing [facility] at [facility]!!         Signed, Almyra Deforest, [place]  03/18/2017 11:48 PM    [address], [place], [place]  [address] Phone: [phone]; Fax: [phone]

## 2017-03-18 NOTE — Patient Instructions (Addendum)
Medication Instructions: Your physician recommends that you continue on your current medications as directed. Please refer to the Current Medication list given to you today.  If you need a refill on your cardiac medications before your next appointment, please call your pharmacy.   Labwork: Your physician recommends that you return for lab work in: 6 weeks for a fasting Lipid and Hepatic lab   Follow-Up: Your physician wants you to follow-up in: 3-4 months with Dr. Ellyn Hack. You will receive a reminder letter in the mail two months in advance. If you don't receive a letter, please call our office to schedule this follow-up appointment.   Thank you for choosing [facility] at [facility]!!

## 2017-03-24 ENCOUNTER — Other Ambulatory Visit (HOSPITAL_COMMUNITY): Payer: 59

## 2017-03-24 ENCOUNTER — Ambulatory Visit (HOSPITAL_COMMUNITY): Payer: 59

## 2017-03-24 ENCOUNTER — Telehealth: Payer: Self-pay | Admitting: Cardiology

## 2017-03-24 NOTE — Telephone Encounter (Signed)
Please review as able - note patient recently seen in office by [facility].

## 2017-03-24 NOTE — Telephone Encounter (Signed)
New message       [facility] Pre-operative Risk Assessment    Request for surgical clearance:  1. What type of surgery is being performed? Cataract surgery  2. When is this surgery scheduled? Not scheduled yet. Waiting for clearance.   3. Are there any medications that need to be held prior to surgery and how long? Not sure.   4. Name of physician performing surgery? Dr. Gershon Crane    What is your office phone and fax number? [phone] [phone] Adam Reyes 03/24/2017, 10:20 AM  _________________________________________________________________   (provider comments below)

## 2017-03-25 NOTE — Telephone Encounter (Signed)
He is cleared

## 2017-03-25 NOTE — Telephone Encounter (Signed)
Routed to requesting provider.

## 2017-03-25 NOTE — Telephone Encounter (Signed)
Called eye surgeon's office, spoke to Annetta North to inform fax was sent. They will contact if any further needs.

## 2017-03-26 ENCOUNTER — Telehealth: Payer: Self-pay | Admitting: Cardiology

## 2017-03-26 NOTE — Telephone Encounter (Signed)
Received [employer] Short Term Disability Forms from [place] [facility] [place] for Dr Ellyn Hack to review, complete and sign.  Forms given to Dr Ellyn Hack. lp

## 2017-03-28 ENCOUNTER — Telehealth: Payer: Self-pay | Admitting: Cardiology

## 2017-03-28 NOTE — Telephone Encounter (Signed)
Received Signed [employer] Short Term Disability Forms back from Dr Ellyn Hack.  Faxed to [employer] and notified patient  Faxed 03/28/17. lp

## 2017-05-15 HISTORY — PX: OTHER SURGICAL HISTORY: SHX169

## 2017-05-20 ENCOUNTER — Telehealth: Payer: Self-pay | Admitting: Cardiology

## 2017-05-20 DIAGNOSIS — I2510 Atherosclerotic heart disease of native coronary artery without angina pectoris: Secondary | ICD-10-CM

## 2017-05-20 NOTE — Telephone Encounter (Signed)
Spoke with pt, he needs a gxt. Order placed and schedculed for the patient

## 2017-05-20 NOTE — Telephone Encounter (Signed)
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

## 2017-05-23 ENCOUNTER — Telehealth (HOSPITAL_COMMUNITY): Payer: Self-pay

## 2017-05-23 NOTE — Telephone Encounter (Signed)
Encounter complete. 

## 2017-05-28 ENCOUNTER — Ambulatory Visit (HOSPITAL_COMMUNITY)
Admission: RE | Admit: 2017-05-28 | Discharge: 2017-05-28 | Disposition: A | Discharge status: Home / Self Care | Payer: 59 | Source: Ambulatory Visit | Attending: Cardiovascular Disease | Admitting: Cardiovascular Disease

## 2017-05-28 DIAGNOSIS — I2510 Atherosclerotic heart disease of native coronary artery without angina pectoris: Secondary | ICD-10-CM | POA: Diagnosis present

## 2017-05-28 LAB — EXERCISE TOLERANCE TEST
CHL CUP MPHR: 161 {beats}/min
CHL CUP RESTING HR STRESS: 75 {beats}/min
CHL CUP STRESS STAGE 1 DBP: 87 mmHg
CHL CUP STRESS STAGE 2 HR: 88 {beats}/min
CHL CUP STRESS STAGE 3 HR: 106 {beats}/min
CHL CUP STRESS STAGE 3 SPEED: 1.7 mph
CHL CUP STRESS STAGE 4 DBP: 63 mmHg
CHL CUP STRESS STAGE 4 GRADE: 12 %
CHL CUP STRESS STAGE 4 HR: 137 {beats}/min
CHL CUP STRESS STAGE 5 DBP: 68 mmHg
CHL CUP STRESS STAGE 5 GRADE: 14 %
CHL CUP STRESS STAGE 5 SBP: 191 mmHg
CHL CUP STRESS STAGE 5 SPEED: 3.4 mph
CHL CUP STRESS STAGE 6 GRADE: 16 %
CHL CUP STRESS STAGE 6 SPEED: 4.2 mph
CHL CUP STRESS STAGE 7 DBP: 70 mmHg
CHL CUP STRESS STAGE 7 SBP: 176 mmHg
CHL CUP STRESS STAGE 8 SPEED: 0 mph
CHL RATE OF PERCEIVED EXERTION: 17
CSEPPHR: 164 {beats}/min
Estimated workload: 10.7 METS
Exercise duration (min): 9 min
Exercise duration (sec): 25 s
Percent HR: 103 %
Percent of predicted max HR: 101 %
Stage 1 Grade: 0 %
Stage 1 HR: 88 {beats}/min
Stage 1 SBP: 146 mmHg
Stage 1 Speed: 0 mph
Stage 2 Grade: 0 %
Stage 2 Speed: 0 mph
Stage 3 DBP: 60 mmHg
Stage 3 Grade: 10 %
Stage 3 SBP: 152 mmHg
Stage 4 SBP: 179 mmHg
Stage 4 Speed: 2.5 mph
Stage 5 HR: 157 {beats}/min
Stage 6 HR: 164 {beats}/min
Stage 7 Grade: 0 %
Stage 7 HR: 137 {beats}/min
Stage 7 Speed: 0 mph
Stage 8 DBP: 63 mmHg
Stage 8 Grade: 0 %
Stage 8 HR: 92 {beats}/min
Stage 8 SBP: 176 mmHg

## 2017-06-10 ENCOUNTER — Telehealth: Payer: Self-pay | Admitting: Cardiology

## 2017-06-10 NOTE — Telephone Encounter (Signed)
Spoke with patient's dentist. Patient is at office for routine dental cleaning and informed dentist that he was evaluated for MI but has no stents. Reviewed patient's chart and there is no cardiac indication (per guidelines, SBE protocol) that patient would need pre-meds for dental work. Dentist aware.

## 2017-06-12 LAB — HEPATIC FUNCTION PANEL
ALT: 25 IU/L (ref 0–44)
AST: 25 IU/L (ref 0–40)
Albumin: 4.5 g/dL (ref 3.5–5.5)
Alkaline Phosphatase: 79 IU/L (ref 39–117)
Bilirubin Total: 1.1 mg/dL (ref 0.0–1.2)
Bilirubin, Direct: 0.31 mg/dL (ref 0.00–0.40)
Total Protein: 6.4 g/dL (ref 6.0–8.5)

## 2017-06-12 LAB — LIPID PANEL
CHOL/HDL RATIO: 3.4 ratio (ref 0.0–5.0)
CHOLESTEROL TOTAL: 89 mg/dL — AB (ref 100–199)
HDL: 26 mg/dL — ABNORMAL LOW (ref >39)
LDL Calculated: 49 mg/dL (ref 0–99)
Triglycerides: 68 mg/dL (ref 0–149)
VLDL Cholesterol Cal: 14 mg/dL (ref 5–40)

## 2017-06-19 ENCOUNTER — Encounter (HOSPITAL_COMMUNITY): Payer: Self-pay

## 2017-06-19 ENCOUNTER — Other Ambulatory Visit: Payer: Self-pay

## 2017-06-19 ENCOUNTER — Encounter (HOSPITAL_BASED_OUTPATIENT_CLINIC_OR_DEPARTMENT_OTHER): Payer: 59 | Admitting: Oncology

## 2017-06-19 ENCOUNTER — Encounter (HOSPITAL_COMMUNITY): Payer: 59 | Attending: Oncology

## 2017-06-19 VITALS — BP 126/66 | HR 67 | Temp 98.1°F | Resp 18 | Wt 258.2 lb

## 2017-06-19 DIAGNOSIS — C9110 Chronic lymphocytic leukemia of B-cell type not having achieved remission: Secondary | ICD-10-CM

## 2017-06-19 DIAGNOSIS — R911 Solitary pulmonary nodule: Secondary | ICD-10-CM

## 2017-06-19 DIAGNOSIS — R599 Enlarged lymph nodes, unspecified: Secondary | ICD-10-CM

## 2017-06-19 DIAGNOSIS — D696 Thrombocytopenia, unspecified: Secondary | ICD-10-CM | POA: Diagnosis not present

## 2017-06-19 DIAGNOSIS — R591 Generalized enlarged lymph nodes: Secondary | ICD-10-CM | POA: Insufficient documentation

## 2017-06-19 LAB — CBC WITH DIFFERENTIAL/PLATELET
Basophils Absolute: 0 10*3/uL (ref 0.0–0.1)
Basophils Relative: 0 %
EOS ABS: 0.2 10*3/uL (ref 0.0–0.7)
Eosinophils Relative: 3 %
HEMATOCRIT: 41.8 % (ref 39.0–52.0)
HEMOGLOBIN: 13.9 g/dL (ref 13.0–17.0)
Lymphocytes Relative: 18 %
Lymphs Abs: 0.9 10*3/uL (ref 0.7–4.0)
MCH: 31 pg (ref 26.0–34.0)
MCHC: 33.3 g/dL (ref 30.0–36.0)
MCV: 93.1 fL (ref 78.0–100.0)
MONOS PCT: 12 %
Monocytes Absolute: 0.6 10*3/uL (ref 0.1–1.0)
Neutro Abs: 3.4 10*3/uL (ref 1.7–7.7)
Neutrophils Relative %: 67 %
Platelets: 107 10*3/uL — ABNORMAL LOW (ref 150–400)
RBC: 4.49 MIL/uL (ref 4.22–5.81)
RDW: 13.3 % (ref 11.5–15.5)
WBC: 5.1 10*3/uL (ref 4.0–10.5)

## 2017-06-19 LAB — COMPREHENSIVE METABOLIC PANEL
ALK PHOS: 79 U/L (ref 38–126)
ALT: 36 U/L (ref 17–63)
ANION GAP: 8 (ref 5–15)
AST: 32 U/L (ref 15–41)
Albumin: 4.4 g/dL (ref 3.5–5.0)
BILIRUBIN TOTAL: 1.3 mg/dL — AB (ref 0.3–1.2)
BUN: 27 mg/dL — ABNORMAL HIGH (ref 6–20)
CALCIUM: 9.4 mg/dL (ref 8.9–10.3)
CO2: 27 mmol/L (ref 22–32)
CREATININE: 0.9 mg/dL (ref 0.61–1.24)
Chloride: 106 mmol/L (ref 101–111)
GFR calc non Af Amer: >60 mL/min (ref >60)
GLUCOSE: 86 mg/dL (ref 65–99)
Potassium: 3.9 mmol/L (ref 3.5–5.1)
SODIUM: 141 mmol/L (ref 135–145)
TOTAL PROTEIN: 7.1 g/dL (ref 6.5–8.1)

## 2017-06-19 LAB — LACTATE DEHYDROGENASE: LDH: 180 U/L (ref 98–192)

## 2017-06-19 NOTE — Patient Instructions (Signed)
[facility] at [facility] Discharge Instructions  RECOMMENDATIONS MADE BY THE CONSULTANT AND ANY TEST RESULTS WILL BE SENT TO YOUR REFERRING PHYSICIAN.  You were seen today by Dr. Louise Zhou    Thank you for choosing [facility] at [facility] to provide your oncology and hematology care.  To afford each patient quality time with our provider, please arrive at least 15 minutes before your scheduled appointment time.    If you have a lab appointment with the Cancer Center please come in thru the  Main Entrance and check in at the main information desk  You need to re-schedule your appointment should you arrive 10 or more minutes late.  We strive to give you quality time with our providers, and arriving late affects you and other patients whose appointments are after yours.  Also, if you no show three or more times for appointments you may be dismissed from the clinic at the providers discretion.     Again, thank you for choosing [facility].  Our hope is that these requests will decrease the amount of time that you wait before being seen by our physicians.       _____________________________________________________________  Should you have questions after your visit to [facility], please contact our office at [phone] between the hours of 8:30 a.m. and 4:30 p.m.  Voicemails left after 4:30 p.m. will not be returned until the following business day.  For prescription refill requests, have your pharmacy contact our office.       Resources For Cancer Patients and their Caregivers ? American Cancer Society: Can assist with transportation, wigs, general needs, runs Look Good Feel Better.        1-888-227-6333 ? Cancer Care: Provides financial assistance, online support groups, medication/co-pay assistance.  1-800-813-HOPE (4673) ? [facility] Assists [place] cancer patients and their  families through emotional , educational and financial support.  336-427-4357 ? [place] DSS Where to apply for food stamps, Medicaid and utility assistance. 336-342-1394 ? [facility]: Transportation to medical appointments. 336-347-2287 ? Social Security Administration: May apply for disability if have a Stage IV cancer. 336-342-7796 1-800-772-1213 ? [place] Aging, Disability and Transit Services: Assists with nutrition, care and transit needs. 336-349-2343  Cancer Center Support Programs: @10RELATIVEDAYS@ > Cancer Support Group  2nd Tuesday of the month 1pm-2pm, Journey Room  > Creative Journey  3rd Tuesday of the month 1130am-1pm, Journey Room  > Look Good Feel Better  1st Wednesday of the month 10am-12 noon, Journey Room (Call American Cancer Society to register 1-800-395-5775)    

## 2017-06-19 NOTE — Progress Notes (Signed)
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 There also are enlarged parotid lymph nodes bilaterally. There is bilateral axillary adenopathy as well as mediastinal adenopathy. Findings are consistent with lymphoma. Biopsy recommended.      06/27/2015 Procedure    Left neck lymph node biopsy by Dr. Benjamine Mola      06/27/2015 Pathology Results    Diagnosis Lymph node for lymphoma, Left neck node for lymphoma work up - [place].  LOW GRADE.      06/27/2015 Pathology Results    Tissue-Flow Cytometry - MONOCLONAL B CELL POPULATION IDENTIFIED. 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 Donald Pore last note on 06/11/16)     INTERVAL HISTORY:  Mr. Busic 61 y.o. male returns for follow-up of B-cell CLL/SLL.   He is here today with his wife.  Patient states that he is noted that the lymph nodes in his neck have gotten a little larger over the past few weeks.  Otherwise he states he feels well.  He continues to work full-time.  He denies any chest pain, shortness of breath, abdominal pain, nausea, vomiting, diarrhea, focal weakness.  He denies any drenching night sweats, unexplained weight loss, fevers or chills.  He denies any recent infections.  REVIEW OF SYSTEMS:  Review of Systems  Constitutional: Negative.  Negative for chills, fatigue and fever.  HENT:   Positive for lump/mass. Negative for nosebleeds.   Eyes: Negative.   Respiratory: Negative.   Negative for cough and shortness of breath.   Cardiovascular: Negative.  Negative for chest pain and leg swelling.  Gastrointestinal: Negative.  Negative for abdominal pain, blood in stool, constipation, diarrhea, nausea and vomiting.  Endocrine: Negative.   Genitourinary: Negative.  Negative for dysuria and hematuria.   Musculoskeletal: Negative.  Negative for arthralgias.  Skin: Negative.  Negative for rash.  Neurological: Negative.  Negative for dizziness and headaches.  Hematological: Positive for adenopathy. Does not bruise/bleed easily.  Psychiatric/Behavioral: Negative.  Negative for depression and sleep disturbance. The patient is not nervous/anxious.      PAST MEDICAL/SURGICAL HISTORY:  Past Medical History:  Diagnosis Date  . 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 Enlarged lymph node    left neck  . STEMI (ST elevation myocardial infarction) ([place]) 03/05/2017   hx/notes 03/05/2017   Past Surgical History:  Procedure Laterality Date  . CARDIAC CATHETERIZATION    . COLONOSCOPY N/A 10/11/2015   Procedure: COLONOSCOPY;  Surgeon: Rogene Houston, MD;  [facility];  Service: Endoscopy;  Laterality: N/A;  10/11/2015  . LEFT HEART CATH AND CORONARY ANGIOGRAPHY N/A 03/05/2017   Procedure: LEFT HEART CATH AND CORONARY ANGIOGRAPHY;  Surgeon: Leonie Man, MD;  Location: [facility];  Service: Cardiovascular;  Laterality: N/A;  . MASS BIOPSY Left 06/27/2015   Procedure: OPEN LEFT NECK BIOPSY ;  Surgeon: Leta Baptist, MD;  Location: [facility];  Service: ENT;  Laterality: Left;  . REFRACTIVE SURGERY Bilateral      SOCIAL HISTORY:  Social History   Socioeconomic History  . Marital status: Married    Spouse name: Not on file  . Number of children: Not on file  . Years of education: Not on file  . Highest education level: Not on file  Social Needs  . Financial resource strain: Not on file  . Food insecurity -  worry: Not on file  . Food insecurity - inability: Not on file  . Transportation needs - medical: Not on file  . Transportation needs - non-medical: Not on file  Occupational History  . Not on file  Tobacco Use  . Smoking status: Former Smoker    Years: 2.00    Types: Cigarettes  . Smokeless tobacco: Former Systems developer    Types: Chew, Snuff  . Tobacco comment: 03/06/2017 "quit smoking when I was young; quit chew/snuff in ~ 2008"  Substance and Sexual Activity  . Alcohol use: Yes    Comment: 03/06/2017 "maybe 6 pack beer/month"  . Drug use: No  . Sexual activity: Yes  Other Topics Concern  . Not on file  Social History Narrative  . Not on file    FAMILY HISTORY:  Family History  Problem Relation Age of Onset  . Diabetes Mother   . Cancer Father   . Cancer Brother     CURRENT MEDICATIONS:  Outpatient Encounter Medications as of 06/19/2017  Medication Sig  . Ascorbic Acid (VITAMIN C PO) Take 1 tablet by mouth daily.  [name] aspirin 81 MG chewable tablet Chew 1 tablet (81 mg total) by mouth daily.  [name] atorvastatin (LIPITOR) 80 MG tablet Take 1 tablet (80 mg total) by mouth daily at 6 PM.  . clopidogrel (PLAVIX) 75 MG tablet Take 1 tablet (75 mg total) by mouth daily with breakfast.  . metoprolol tartrate (LOPRESSOR) 25 MG tablet Take 1 tablet (25 mg total) by mouth 2 (two) times daily.   No facility-administered encounter medications on file as of 06/19/2017.     ALLERGIES:  No Known Allergies   PHYSICAL EXAM:  ECOG Performance status: 0 - Asymptomatic   Vitals:   06/19/17 1455  BP: 126/66  Pulse: 67  Resp: 18  Temp: 98.1 F (36.7 C)  SpO2: 98%   Filed Weights   06/19/17 1455  Weight: 258 lb 3.2 oz (117.1 kg)    Physical Exam  Constitutional: He is oriented to person, place, and time and well-developed, well-nourished, and in no distress.  HENT:  Mouth/Throat: Oropharynx is clear and moist. No oropharyngeal exudate.  Eyes: Conjunctivae are normal. Pupils are equal,  round, and reactive to light. No scleral icterus.  Neck: Normal range of motion.  Left submandibular lymphadenopathy 3 cm, right submandibular lymphadenopathy measure 2.8 cm. No cervical lymphadenopathy palpated. No supraclavicular lymphadenopathy palpated.  Cardiovascular: Normal rate, regular rhythm and normal heart sounds.  Pulmonary/Chest: Effort normal and breath sounds normal. No respiratory distress. He has no wheezes. He has no rales.  Abdominal: Soft. Bowel sounds are normal. There is splenomegaly. There is no tenderness. There is no rebound and no guarding.  Musculoskeletal: Normal range of motion. He exhibits no edema.  Lymphadenopathy:       Head (right side): Submandibular adenopathy present.       Head (left side): Submandibular adenopathy present.    He has no axillary adenopathy.       Right: No supraclavicular adenopathy present.       Left: No  supraclavicular adenopathy present.  Neurological: He is alert and oriented to person, place, and time. No cranial nerve deficit. Gait normal.  Skin: Skin is warm and dry. No rash noted.  Psychiatric: Mood, memory, affect and judgment normal.  Nursing note and vitals reviewed.    LABORATORY DATA:  I have reviewed the labs as listed.  CBC    Component Value Date/Time   WBC 2.3 (L) 03/07/2017 0533   RBC 4.43 03/07/2017 0533   HGB 13.1 03/07/2017 0533   HCT 38.0 (L) 03/07/2017 0533   PLT 109 (L) 03/07/2017 0533   MCV 85.8 03/07/2017 0533   MCH 29.6 03/07/2017 0533   MCHC 34.5 03/07/2017 0533   RDW 13.4 03/07/2017 0533   LYMPHSABS 1.5 03/05/2017 0710   MONOABS 0.5 03/05/2017 0710   EOSABS 0.0 03/05/2017 0710   BASOSABS 0.0 03/05/2017 0710   CMP Latest Ref Rng & Units 06/11/2017 03/07/2017 03/05/2017  Glucose 65 - 99 mg/dL - 124(H) 134(H)  BUN 6 - 20 mg/dL - 13 14  Creatinine 0.61 - 1.24 mg/dL - 0.81 0.82  Sodium 135 - 145 mmol/L - 137 133(L)  Potassium 3.5 - 5.1 mmol/L - 3.6 3.6  Chloride 101 - 111 mmol/L - 101 98(L)    CO2 22 - 32 mmol/L - 27 27  Calcium 8.9 - 10.3 mg/dL - 8.8(L) 9.0  Total Protein 6.0 - 8.5 g/dL 6.4 - -  Total Bilirubin 0.0 - 1.2 mg/dL 1.1 - -  Alkaline Phos 39 - 117 IU/L 79 - -  AST 0 - 40 IU/L 25 - -  ALT 0 - 44 IU/L 25 - -    PENDING LABS:    DIAGNOSTIC IMAGING:  *The following radiologic images and reports have been reviewed independently and agree with below findings.  CT abd/pelvis: 07/21/15      PATHOLOGY:      Peripheral blood cytogenetics: 11/03/15          ASSESSMENT & PLAN:   B-cell CLL/SLL RAI stage 2:  -Diagnosed in 06/2015. CT in 2017 showed bulky adenopathy to bilateral hilar, mediastinal, retroperitoneal, mesenteric, and bilateral pelvic lymphadenopathy. Also with mild splenomegaly noted at that time. 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 Peripheral cytogenetic analysis in 10/2015 was normal.  -Continues to have bulky adenopathy.  -Labs reviewed today. Thrombocytopenia is mildly worse, but largely stable; platelets 107k today. No active bleeding episodes. Hemoglobin remains WNL. WBCs normal as well.  -No B symptoms at this time.  -Discussed that based on NCCN Guidelines, currently his disease appears to be stable and there is no indication for starting treatment for CLL.   NCCN guidelines for indication for treatment of CLL are:  A. Eligible for clinical trial  B. Significant disease-related symptoms   1. Fatigue (severe)   2. Night sweats   3. Weight loss   4. Fever without infection  C. Threatened end-organ function  D. Progressive bulky disease (spleen >6cm below costal margin, lymph nodes >10 cm)  E. Progressive anemia  F. Progressive thrombocytopenia.  - I have discussed with him since he is almost at that point where his platelets may drop below 100k, where we need to start treatment, I have recommended closer surveillance with follow up in 3 months. -Patient asked about how long he needs  to take plavix for, I recommended he speak with his cardiologist since it was prescribed for suspected STEMI.  Dispo:  -Return to cancer center in 3 months for continued follow-up with labs.  All questions were answered to patient's stated satisfaction. Encouraged patient to call with any new concerns or questions before his next visit to the cancer center and we can certain see him sooner, if needed.    Twana First, MD

## 2017-06-20 ENCOUNTER — Encounter: Payer: Self-pay | Admitting: Cardiology

## 2017-06-20 ENCOUNTER — Ambulatory Visit (INDEPENDENT_AMBULATORY_CARE_PROVIDER_SITE_OTHER): Payer: 59 | Admitting: Cardiology

## 2017-06-20 VITALS — BP 118/70 | HR 66 | Resp 97 | Ht 76.0 in | Wt 257.8 lb

## 2017-06-20 DIAGNOSIS — Z79899 Other long term (current) drug therapy: Secondary | ICD-10-CM | POA: Diagnosis not present

## 2017-06-20 DIAGNOSIS — I25119 Atherosclerotic heart disease of native coronary artery with unspecified angina pectoris: Secondary | ICD-10-CM

## 2017-06-20 DIAGNOSIS — I2119 ST elevation (STEMI) myocardial infarction involving other coronary artery of inferior wall: Secondary | ICD-10-CM

## 2017-06-20 DIAGNOSIS — E785 Hyperlipidemia, unspecified: Secondary | ICD-10-CM | POA: Diagnosis not present

## 2017-06-20 MED ORDER — ASPIRIN 81 MG PO CHEW: 81.0000 mg | CHEWABLE_TABLET | ORAL | 11 refills | Status: DC

## 2017-06-20 MED ORDER — NITROGLYCERIN 0.4 MG SL SUBL: 0.4000 mg | SUBLINGUAL_TABLET | SUBLINGUAL | 6 refills | Status: DC | PRN

## 2017-06-20 NOTE — Patient Instructions (Signed)
MEDICATION CHANGES   ASPIRIN 81 MG TAKE EVERY OTHER DAY  PRESCRIPTION FOR SUBLINGUAL NITROGLYCERIN- ORDERED    LABS - FEB 2019 - DO NOT EAT OR DRINK THE MORNING OF THE TEST LIPID  CMP   YOU MAY GO TO [facility] IN [place] OR [place].( TAKE LABSLIP    Your physician recommends that you schedule a follow-up appointment in MARCH 2019 [place].

## 2017-06-20 NOTE — Progress Notes (Signed)
PCP: Patient, No Pcp Per  Clinic Note: Chief Complaint  Patient presents with  . Follow-up    Heart attack with nonobstructive disease    HPI: Adam Reyes is a 61 y.o. male with a PMH below who presents today for 52-month follow-up of CAD with subtle possible ST elevation MI versus non-STEMI. -- Sx was "a little chest pain that did not go away & some shortness of breath".  --Because of EKG changes and intermittent chest pain he was taken directly to the Cath Lab and found to have moderate bifurcation LAD-D1 lesions that were not flow limiting.  He was chest pain-free.  Therefore the decision was made to treat medically in order to avoid bifurcation PCI.  Adam Reyes was last seen on September 4 by Almyra Deforest, PA-C for post hospital follow-up.  He was doing fairly well was back to walking and doing exercise.  No recurrent chest tightness or pressure.  A GXT was ordered to reassess his LAD lesion.  Recent Hospitalizations: none since last d/c  Studies Personally Reviewed - (if available, images/films reviewed: From Epic Chart or Care Everywhere)  Cardiac catheterization March 05, 2017: Proximal LAD 50% but ostial diagonal bifurcation.  Diagonal 1 also has 55%.  Preserved EF 50-55%.  Presumed culprit was this bifurcation lesion, however it was not flow-limiting at the time.  Therefore treated with IV heparin and aspirin/Plavix.  GXT May 28, 2017 -10.7 METs (9: 25 min.  Reached 103% max predicted heart rate).  No EKG findings to suggest coronary ischemia.  Negative, low risk GXT  Interval History: Adam Reyes presents here today doing very well without any major complaints.  He did very well on his GXT and denied any chest tightness or pressure associated with it.  He is very active now with no major complaints.  He does have some bruising, but denies any major bleeding issues.    Cardiac review of symptoms: No chest pain or shortness of breath with rest or exertion. No PND, orthopnea or  edema. No palpitations, lightheadedness, dizziness, weakness or syncope/near syncope. No TIA/amaurosis fugax symptoms. No melena, hematochezia, hematuria, or epstaxis. No claudication.  ROS: A comprehensive was performed. Review of Systems  Constitutional: Negative for malaise/fatigue.  HENT: Negative for nosebleeds.   Respiratory: Negative for cough and shortness of breath.   Gastrointestinal: Negative for abdominal pain and constipation.  Genitourinary: Negative for dysuria.  Musculoskeletal: Negative for falls and joint pain.  Neurological: Negative for dizziness and tremors.  Endo/Heme/Allergies: Bruises/bleeds easily.  Psychiatric/Behavioral: Negative for depression and memory loss. The patient is not nervous/anxious and does not have insomnia.   All other systems reviewed and are negative.  I have reviewed and (if needed) personally updated the patient's problem list, medications, allergies, past medical and surgical history, social and family history.   Past Medical History:  Diagnosis Date  . 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 Enlarged lymph node    left neck  . STEMI (ST elevation myocardial infarction) ([place]) 03/05/2017   hx/notes 03/05/2017 -likely aborted anterior STEMI with 50% bifurcation LAD-Diag1.  No PCI.  Preserved EF    Past Surgical History:  Procedure Laterality Date  . COLONOSCOPY N/A 10/11/2015   Procedure: COLONOSCOPY;  Surgeon: Rogene Houston, MD;  [facility];  Service: Endoscopy;  Laterality: N/A;  10/11/2015  .  Graded Exercise Tolerance Test (GXT/ETT)  05/2017   10.7 METs (9: 25 min.  Reached 103% max predicted heart rate).  No EKG findings to suggest coronary ischemia.  Negative, low risk GXT  . LEFT HEART CATH AND CORONARY ANGIOGRAPHY N/A 03/05/2017   Procedure: LEFT HEART CATH AND  CORONARY ANGIOGRAPHY;  Surgeon: Leonie Man, MD;  Location: [facility];  Service: Cardiovascular: pLAD-Diag1 50-55% (non-flow-limiiting).  EF ~50-55%.  although bifurcation lesion was presumed Culprit - no PCI (not flow limiting). - Med Rx.  Adam Reyes MASS BIOPSY Left 06/27/2015   Procedure: OPEN LEFT NECK BIOPSY ;  Surgeon: Leta Baptist, MD;  Location: [facility];  Service: ENT;  Laterality: Left;  . REFRACTIVE SURGERY Bilateral     Current Meds  Medication Sig  . Ascorbic Acid (VITAMIN C PO) Take 1 tablet by mouth daily.  Adam Reyes aspirin 81 MG chewable tablet Chew 1 tablet (81 mg total) by mouth every other day.  Adam Reyes atorvastatin (LIPITOR) 80 MG tablet Take 1 tablet (80 mg total) by mouth daily at 6 PM.  . clopidogrel (PLAVIX) 75 MG tablet Take 1 tablet (75 mg total) by mouth daily with breakfast.  . metoprolol tartrate (LOPRESSOR) 25 MG tablet Take 1 tablet (25 mg total) by mouth 2 (two) times daily.  . [DISCONTINUED] aspirin 81 MG chewable tablet Chew 1 tablet (81 mg total) by mouth daily.    No Known Allergies  Social History   Socioeconomic History  . Marital status: Married    Spouse name: None  . Number of children: None  . Years of education: None  . Highest education level: None  Social Needs  . Financial resource strain: None  . Food insecurity - worry: None  . Food insecurity - inability: None  . Transportation needs - medical: None  . Transportation needs - non-medical: None  Occupational History  . None  Tobacco Use  . Smoking status: Former Smoker    Years: 2.00    Types: Cigarettes  . Smokeless tobacco: Former Systems developer    Types: Chew, Snuff  . Tobacco comment: 03/06/2017 "quit smoking when I was young; quit chew/snuff in ~ 2008"  Substance and Sexual Activity  . Alcohol use: Yes    Comment: 03/06/2017 "maybe 6 pack beer/month"  . Drug use: No  . Sexual activity: Yes  Other Topics Concern  . None  Social History Narrative  . None    family history  includes Cancer in his brother and father; Diabetes in his mother.  Wt Readings from Last 3 Encounters:  06/20/17 257 lb 12.8 oz (116.9 kg)  06/19/17 258 lb 3.2 oz (117.1 kg)  03/18/17 260 lb (117.9 kg)    PHYSICAL EXAM BP 118/70   Pulse 66   Resp (!) 97   Ht 6\' 4"  (1.93 m)   Wt 257 lb 12.8 oz (116.9 kg)   BMI 31.38 kg/m  Physical Exam  Constitutional: He is oriented to person, place, and time. He appears well-developed and well-nourished. No distress.  Well-groomed.  Healthy-appearing  HENT:  Head: Normocephalic and atraumatic.  Eyes: EOM are normal.  Neck: Normal range of motion. Neck supple. No hepatojugular reflux and no JVD present. Carotid bruit is not present.  Cardiovascular: Normal rate, regular rhythm, normal heart sounds and normal pulses.  No extrasystoles are present. PMI is not displaced. Exam reveals no gallop and no friction rub.  No murmur heard. Pulmonary/Chest: Effort normal and breath sounds normal. No respiratory distress. He has  no wheezes. He has no rales.  Abdominal: Soft. Bowel sounds are normal. He exhibits no distension. There is no tenderness.  Musculoskeletal: Normal range of motion. He exhibits no edema.  Neurological: He is alert and oriented to person, place, and time.  Psychiatric: He has a normal mood and affect. His behavior is normal. Judgment and thought content normal.  Nursing note and vitals reviewed.    Adult ECG Report none  Other studies Reviewed: Additional studies/ records that were reviewed today include:  Recent Labs:  Lab Results  Component Value Date   CHOL 89 (L) 06/11/2017   HDL 26 (L) 06/11/2017   LDLCALC 49 06/11/2017   TRIG 68 06/11/2017   CHOLHDL 3.4 06/11/2017    Lab Results  Component Value Date   CREATININE 0.90 06/19/2017   BUN 27 (H) 06/19/2017   NA 141 06/19/2017   K 3.9 06/19/2017   CL 106 06/19/2017   CO2 27 06/19/2017    ASSESSMENT / PLAN: Problem List Items Addressed This Visit    Coronary  artery disease involving native coronary artery of native heart with angina pectoris (HCC) (Chronic)    Nonobstructive LAD-D1 lesion noted on cath.  He just completed a GXT/ETT with negative results and no angina at an excellent exercise tolerance.  The fact that he did not have any angina at that level activity would argue that the bifurcation lesion is not flow limiting.  We therefore would not need to proceed with an intervention. Plan: Continue high-dose statin for now and recheck at follow-up visit.  Continue aspirin plus Plavix but okay to reduce aspirin to every other day. Continue low-dose beta-blocker.  We will give as needed nitroglycerin prescription.      Relevant Medications   nitroGLYCERIN (NITROSTAT) 0.4 MG SL tablet   aspirin 81 MG chewable tablet   Other Relevant Orders   Comprehensive metabolic panel   Dyslipidemia, goal LDL below 70 (Chronic)    Lipids as of November 28 are fairly well controlled.  Can recheck in 3 months.  If stable at that time, will reduce statin to 40 mg daily.      Relevant Medications   nitroGLYCERIN (NITROSTAT) 0.4 MG SL tablet   aspirin 81 MG chewable tablet   Other Relevant Orders   Lipid panel   Comprehensive metabolic panel   ST elevation myocardial infarction (STEMI) of inferolateral wall, initial episode of care ([place]) - Primary (Chronic)    Likely aborted MI.  I suspect perhaps the aspirin and heparin given in the emergency room led to resolution of the occlusive thrombus.  Continue aspirin plus Plavix for 1 year (okay to reduce aspirin to every other day).      Relevant Medications   nitroGLYCERIN (NITROSTAT) 0.4 MG SL tablet   aspirin 81 MG chewable tablet    Other Visit Diagnoses    Medication management       Relevant Orders   Comprehensive metabolic panel      Current medicines are reviewed at length with the patient today. (+/- concerns) bruising The following changes have been made: take ASA qod.  While miscarry she was  run with a history of having a seizure is  Patient Instructions   MEDICATION CHANGES   ASPIRIN 81 MG TAKE EVERY OTHER DAY  PRESCRIPTION FOR SUBLINGUAL NITROGLYCERIN- ORDERED    LABS - FEB 2019 - DO NOT EAT OR DRINK THE MORNING OF THE TEST LIPID  CMP   YOU MAY GO TO [facility] IN [place] OR  [place].( TAKE LABSLIP    Your physician recommends that you schedule a follow-up appointment in MARCH 2019 [place].    Studies Ordered:   Orders Placed This Encounter  Procedures  . Lipid panel  . Comprehensive metabolic panel      Glenetta Hew, M.D., M.S. Interventional Cardiologist   Pager # [phone] Phone # [phone] [address] [place], [place] [address]    Thank you for choosing [facility] at [facility]!!

## 2017-06-22 ENCOUNTER — Encounter: Payer: Self-pay | Admitting: Cardiology

## 2017-06-22 NOTE — Assessment & Plan Note (Addendum)
Nonobstructive LAD-D1 lesion noted on cath.  He just completed a GXT/ETT with negative results and no angina at an excellent exercise tolerance.  The fact that he did not have any angina at that level activity would argue that the bifurcation lesion is not flow limiting.  We therefore would not need to proceed with an intervention. Plan: Continue high-dose statin for now and recheck at follow-up visit.  Continue aspirin plus Plavix but okay to reduce aspirin to every other day. Continue low-dose beta-blocker.  We will give as needed nitroglycerin prescription.

## 2017-06-22 NOTE — Assessment & Plan Note (Signed)
Likely aborted MI.  I suspect perhaps the aspirin and heparin given in the emergency room led to resolution of the occlusive thrombus.  Continue aspirin plus Plavix for 1 year (okay to reduce aspirin to every other day).

## 2017-06-22 NOTE — Assessment & Plan Note (Signed)
Lipids as of November 28 are fairly well controlled.  Can recheck in 3 months.  If stable at that time, will reduce statin to 40 mg daily.

## 2017-09-16 ENCOUNTER — Other Ambulatory Visit (HOSPITAL_COMMUNITY): Payer: Self-pay

## 2017-09-16 DIAGNOSIS — C9110 Chronic lymphocytic leukemia of B-cell type not having achieved remission: Secondary | ICD-10-CM

## 2017-09-17 ENCOUNTER — Inpatient Hospital Stay (HOSPITAL_COMMUNITY): Payer: 59

## 2017-09-17 ENCOUNTER — Other Ambulatory Visit: Payer: Self-pay

## 2017-09-17 ENCOUNTER — Inpatient Hospital Stay (HOSPITAL_COMMUNITY): Payer: 59 | Attending: Internal Medicine | Admitting: Internal Medicine

## 2017-09-17 ENCOUNTER — Encounter (HOSPITAL_COMMUNITY): Payer: Self-pay | Admitting: Internal Medicine

## 2017-09-17 VITALS — BP 124/66 | HR 69 | Temp 98.2°F | Resp 16 | Ht 75.0 in | Wt 258.0 lb

## 2017-09-17 DIAGNOSIS — R591 Generalized enlarged lymph nodes: Secondary | ICD-10-CM

## 2017-09-17 DIAGNOSIS — R918 Other nonspecific abnormal finding of lung field: Secondary | ICD-10-CM | POA: Insufficient documentation

## 2017-09-17 DIAGNOSIS — D696 Thrombocytopenia, unspecified: Secondary | ICD-10-CM | POA: Diagnosis not present

## 2017-09-17 DIAGNOSIS — N400 Enlarged prostate without lower urinary tract symptoms: Secondary | ICD-10-CM | POA: Diagnosis not present

## 2017-09-17 DIAGNOSIS — C9110 Chronic lymphocytic leukemia of B-cell type not having achieved remission: Secondary | ICD-10-CM

## 2017-09-17 DIAGNOSIS — R161 Splenomegaly, not elsewhere classified: Secondary | ICD-10-CM | POA: Insufficient documentation

## 2017-09-17 DIAGNOSIS — R599 Enlarged lymph nodes, unspecified: Secondary | ICD-10-CM

## 2017-09-17 LAB — COMPREHENSIVE METABOLIC PANEL
ALBUMIN: 4.2 g/dL (ref 3.5–5.0)
ALT: 41 U/L (ref 17–63)
ANION GAP: 10 (ref 5–15)
AST: 34 U/L (ref 15–41)
Alkaline Phosphatase: 78 U/L (ref 38–126)
BUN: 20 mg/dL (ref 6–20)
CALCIUM: 9.1 mg/dL (ref 8.9–10.3)
CO2: 26 mmol/L (ref 22–32)
Chloride: 101 mmol/L (ref 101–111)
Creatinine, Ser: 0.88 mg/dL (ref 0.61–1.24)
GFR calc Af Amer: >60 mL/min (ref >60)
GFR calc non Af Amer: >60 mL/min (ref >60)
GLUCOSE: 98 mg/dL (ref 65–99)
Potassium: 3.6 mmol/L (ref 3.5–5.1)
SODIUM: 137 mmol/L (ref 135–145)
Total Bilirubin: 1.4 mg/dL — ABNORMAL HIGH (ref 0.3–1.2)
Total Protein: 6.8 g/dL (ref 6.5–8.1)

## 2017-09-17 LAB — CBC WITH DIFFERENTIAL/PLATELET
BASOS ABS: 0 10*3/uL (ref 0.0–0.1)
BASOS PCT: 0 %
EOS ABS: 0.1 10*3/uL (ref 0.0–0.7)
Eosinophils Relative: 3 %
HCT: 38.7 % — ABNORMAL LOW (ref 39.0–52.0)
Hemoglobin: 13 g/dL (ref 13.0–17.0)
Lymphocytes Relative: 16 %
Lymphs Abs: 0.5 10*3/uL — ABNORMAL LOW (ref 0.7–4.0)
MCH: 31 pg (ref 26.0–34.0)
MCHC: 33.6 g/dL (ref 30.0–36.0)
MCV: 92.1 fL (ref 78.0–100.0)
MONOS PCT: 13 %
Monocytes Absolute: 0.4 10*3/uL (ref 0.1–1.0)
NEUTROS PCT: 68 %
Neutro Abs: 2.2 10*3/uL (ref 1.7–7.7)
Platelets: 82 10*3/uL — ABNORMAL LOW (ref 150–400)
RBC: 4.2 MIL/uL — AB (ref 4.22–5.81)
RDW: 13.3 % (ref 11.5–15.5)
WBC: 3.3 10*3/uL — AB (ref 4.0–10.5)

## 2017-09-17 NOTE — Patient Instructions (Signed)
[facility] at [facility] Discharge Instructions   You were seen today by Dr. Zoila Shutter We will set you up for a CT scan of your chest, abdomen and pelvis We are also going to send off some additional special lab work  We will have you follow up a few days after scans to review results   Thank you for choosing [facility] at [facility] to provide your oncology and hematology care.  To afford each patient quality time with our provider, please arrive at least 15 minutes before your scheduled appointment time.    If you have a lab appointment with the [facility] please come in thru the  Main Entrance and check in at the main information desk  You need to re-schedule your appointment should you arrive 10 or more minutes late.  We strive to give you quality time with our providers, and arriving late affects you and other patients whose appointments are after yours.  Also, if you no show three or more times for appointments you may be dismissed from the clinic at the providers discretion.     Again, thank you for choosing [facility].  Our hope is that these requests will decrease the amount of time that you wait before being seen by our physicians.       _____________________________________________________________  Should you have questions after your visit to [facility], please contact our office at [phone] between the hours of 8:30 a.m. and 4:30 p.m.  Voicemails left after 4:30 p.m. will not be returned until the following business day.  For prescription refill requests, have your pharmacy contact our office.       Resources For Cancer Patients and their Caregivers ? American Cancer Society: Can assist with transportation, wigs, general needs, runs Look Good Feel Better.        509-738-5678 ? Cancer Care: Provides financial assistance, online support groups, medication/co-pay assistance.   1-800-813-HOPE 5341095142) ? [facility] Assists [place] cancer patients and their families through emotional , educational and financial support.  937-664-8935 ? [place] DSS Where to apply for food stamps, Medicaid and utility assistance. (539)293-4145 ? [facility]: Transportation to medical appointments. 270-106-7183 ? Social Security Administration: May apply for disability if have a Stage IV cancer. 437-743-5456 208-853-0884 ? [employer], Disability and Transit Services: Assists with nutrition, care and transit needs. [place] Support Programs:   > Cancer Support Group  2nd Tuesday of the month 1pm-2pm, Journey Room   > Creative Journey  3rd Tuesday of the month 1130am-1pm, Journey Room

## 2017-09-19 ENCOUNTER — Ambulatory Visit (INDEPENDENT_AMBULATORY_CARE_PROVIDER_SITE_OTHER): Payer: 59 | Admitting: Cardiology

## 2017-09-19 ENCOUNTER — Encounter: Payer: Self-pay | Admitting: Cardiology

## 2017-09-19 VITALS — BP 102/66 | HR 68 | Ht 76.0 in | Wt 255.8 lb

## 2017-09-19 DIAGNOSIS — I2119 ST elevation (STEMI) myocardial infarction involving other coronary artery of inferior wall: Secondary | ICD-10-CM | POA: Diagnosis not present

## 2017-09-19 DIAGNOSIS — I25119 Atherosclerotic heart disease of native coronary artery with unspecified angina pectoris: Secondary | ICD-10-CM

## 2017-09-19 DIAGNOSIS — E785 Hyperlipidemia, unspecified: Secondary | ICD-10-CM | POA: Diagnosis not present

## 2017-09-19 NOTE — Progress Notes (Signed)
PCP: Asencion Noble, MD  Clinic Note: Chief Complaint  Patient presents with  . Follow-up    no complaints  . Code STEMI     nonocclusive disease found.    HPI: Adam Reyes is a 62 y.o. male with a PMH below who presents today for 78-month follow-up of CAD with subtle possible ST elevation MI versus non-STEMI. -- Sx was "a little chest pain that did not go away & some shortness of breath".  --Because of EKG changes and intermittent chest pain he was taken directly to the Cath Lab and found to have moderate bifurcation LAD-D1 lesions that were not flow limiting.  He was chest pain-free.  Therefore the decision was made to treat medically in order to avoid bifurcation PCI. He was seen initially post hospitalization by Almyra Deforest, PA.  Was doing well.  No recurrent symptoms. GXT was ordered to reassess his LAD lesion.  Bartosiewicz BERISHA was then seen by me on June 20, 2017.  He again was doing very well with no major complaints.  No recurrent anginal symptoms.  Again doing very well with no  Recent Hospitalizations: none since last d/c  Studies Personally Reviewed - (if available, images/films reviewed: From Epic Chart or Care Everywhere)  GXT May 28, 2017 -10.7 METs (9: 25 min.  Reached 103% max predicted heart rate).  No EKG findings to suggest coronary ischemia.  Negative, low risk GXT  Interval History: Matix presents here today without any major cardiac complaints.  He has not had any symptoms similar to his MI type symptoms.  He is very active walking and exercising.  No limitations.  No anginal chest pain or dyspnea with rest or exertion. No heart failure symptoms of PND, orthopnea or edema. Besides mild bruising, he denies any bleeding issues of melena, hematochezia, hematuria or epistaxis.  Cardiac review of symptoms:  Cardiovascular ROS: no chest pain or dyspnea on exertion negative for - edema, irregular heartbeat, murmur, orthopnea, palpitations, paroxysmal nocturnal  dyspnea, rapid heart rate, shortness of breath or Syncope/near syncope, TIA/amaurosis fugax, claudication.   ROS: A comprehensive was performed. Review of Systems  Constitutional: Negative for malaise/fatigue.  HENT: Negative for congestion and nosebleeds.   Respiratory: Negative for cough and shortness of breath.   Cardiovascular: Negative for claudication.  Gastrointestinal: Negative for abdominal pain, blood in stool, constipation and melena.  Genitourinary: Negative for dysuria and hematuria.  Musculoskeletal: Negative for falls and joint pain.  Neurological: Negative for dizziness and tremors.  Endo/Heme/Allergies: Bruises/bleeds easily.  Psychiatric/Behavioral: Negative for depression and memory loss. The patient is not nervous/anxious and does not have insomnia.   All other systems reviewed and are negative.  I have reviewed and (if needed) personally updated the patient's problem list, medications, allergies, past medical and surgical history, social and family history.   Past Medical History:  Diagnosis Date  . 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 Enlarged lymph node    left neck  . STEMI (ST elevation myocardial infarction) ([place]) 03/05/2017   hx/notes 03/05/2017 -likely aborted anterior STEMI with 50% bifurcation LAD-Diag1.  No PCI.  Preserved EF    Past Surgical History:  Procedure Laterality Date  . COLONOSCOPY N/A 10/11/2015   Procedure: COLONOSCOPY;  Surgeon: Rogene Houston, MD;  [facility];  Service: Endoscopy;  Laterality: N/A;  10/11/2015  .  Graded Exercise Tolerance Test (GXT/ETT)  05/2017   10.7 METs (9: 25 min.  Reached 103% max predicted heart rate).  No EKG findings to suggest coronary ischemia.  Negative, low risk GXT  . LEFT HEART CATH AND CORONARY ANGIOGRAPHY N/A 03/05/2017   Procedure: LEFT  HEART CATH AND CORONARY ANGIOGRAPHY;  Surgeon: Leonie Man, MD;  Location: [place] [facility];  Service: Cardiovascular: pLAD-Diag1 50-55% (non-flow-limiiting).  EF ~50-55%.  although bifurcation lesion was presumed Culprit - no PCI (not flow limiting). - Med Rx.  [name] MASS BIOPSY Left 06/27/2015   Procedure: OPEN LEFT NECK BIOPSY ;  Surgeon: Leta Baptist, MD;  Location: [place];  Service: ENT;  Laterality: Left;  . REFRACTIVE SURGERY Bilateral      Current Meds  Medication Sig  . Ascorbic Acid (VITAMIN C PO) Take 1 tablet by mouth daily.  [name] aspirin 81 MG chewable tablet Chew 1 tablet (81 mg total) by mouth every other day.  [name] atorvastatin (LIPITOR) 80 MG tablet Take 1 tablet (80 mg total) by mouth daily at 6 PM.  . clopidogrel (PLAVIX) 75 MG tablet Take 1 tablet (75 mg total) by mouth daily with breakfast.  . metoprolol tartrate (LOPRESSOR) 25 MG tablet Take 1 tablet (25 mg total) by mouth 2 (two) times daily.    No Known Allergies  Social History   Socioeconomic History  . Marital status: Married    Spouse name: None  . Number of children: None  . Years of education: None  . Highest education level: None  Social Needs  . Financial resource strain: None  . Food insecurity - worry: None  . Food insecurity - inability: None  . Transportation needs - medical: None  . Transportation needs - non-medical: None  Occupational History  . None  Tobacco Use  . Smoking status: Former Smoker    Years: 2.00    Types: Cigarettes  . Smokeless tobacco: Former Systems developer    Types: Chew, Snuff  . Tobacco comment: 03/06/2017 "quit smoking when I was young; quit chew/snuff in ~ 2008"  Substance and Sexual Activity  . Alcohol use: Yes    Comment: 03/06/2017 "maybe 6 pack beer/month"  . Drug use: No  . Sexual activity: Yes  Other Topics Concern  . None  Social History Narrative  . None    family history includes Cancer in his brother and father; Diabetes in his mother.  Wt  Readings from Last 3 Encounters:  09/19/17 255 lb 12.8 oz (116 kg)  09/17/17 258 lb (117 kg)  06/20/17 257 lb 12.8 oz (116.9 kg)    PHYSICAL EXAM BP 102/66 (BP Location: Left Arm, Patient Position: Sitting, Cuff Size: Normal)   Pulse 68   Ht 6\' 4"  (1.93 m)   Wt 255 lb 12.8 oz (116 kg)   SpO2 95%   BMI 31.14 kg/m  Physical Exam  Constitutional: He is oriented to person, place, and time. He appears well-developed and well-nourished. No distress.  Well-groomed.  Healthy-appearing  HENT:  Head: Normocephalic and atraumatic.  Eyes: EOM are normal.  Neck: Normal range of motion. Neck supple. No hepatojugular reflux and no JVD present. Carotid bruit is not present.  Cardiovascular: Normal rate, regular rhythm, normal heart sounds and normal pulses.  No extrasystoles are present. PMI is not displaced. Exam reveals no gallop and no friction rub.  No murmur heard. Pulmonary/Chest: Effort normal and breath sounds normal. No respiratory distress. He has no wheezes. He has no rales.  Abdominal: Soft. Bowel sounds are normal. He exhibits no distension. There is no tenderness.  Musculoskeletal: Normal range of motion. He exhibits no edema.  Neurological: He is alert and oriented to person, place, and time.  Skin: Skin is warm and dry.  Psychiatric: He has a normal mood and affect. His behavior is normal. Judgment and thought content normal.  Nursing note and vitals reviewed.    Adult ECG Report none  Other studies Reviewed: Additional studies/ records that were reviewed today include:  Recent Labs:  Lab Results  Component Value Date   CHOL 89 (L) 06/11/2017   HDL 26 (L) 06/11/2017   LDLCALC 49 06/11/2017   TRIG 68 06/11/2017   CHOLHDL 3.4 06/11/2017    Lab Results  Component Value Date   CREATININE 0.88 09/17/2017   BUN 20 09/17/2017   NA 137 09/17/2017   K 3.6 09/17/2017   CL 101 09/17/2017   CO2 26 09/17/2017    ASSESSMENT / PLAN: Problem List Items Addressed This Visit      ST elevation myocardial infarction (STEMI) of inferolateral wall, initial episode of care ([place]) (Chronic)    Aborted anterior STEMI.  The only major coronary artery that had a lesion that could have gone along with his EKG changes and symptoms was the bifurcation LAD-diagonal branch lesion that is moderate at best now.  The decision was made to treat medically and he is doing well. We will continue aggressive risk factor modification as well as DAPT for 1 year.  Follow-up GXT did not show that there was symptomatic evidence of ischemia in this distribution.      Dyslipidemia, goal LDL below 70 - Primary (Chronic)    Excellent lipids as of November.  We will recheck in roughly 17months.  Pending on the results, can likely reduce to 40 mg atorvastatin.      Relevant Orders   Lipid panel   Hepatic function panel   Coronary artery disease involving native coronary artery of native heart with angina pectoris (HCC) (Chronic)    Moderate LAD-D1 bifurcation lesion.  Nonischemic symptoms on GXT/PTT.  Therefore we will not consider staged PCI.  Continue to follow. He remains on every other day aspirin plus Plavix.  Would stay on Plavix for at least one year.  Can probably stop aspirin at next visit. Continue atorvastatin at current dose but we will recheck lipids and hopefully reduce dose at that time. Continue low-dose beta-blocker as tolerated.         Current medicines are reviewed at length with the patient today. (+/- concerns) bruising The following changes have been made:Taking aspirin every other day  Patient Instructions  Medication Instructions:  Your physician recommends that you continue on your current medications as directed. Please refer to the Current Medication list given to you today.  Labwork: Please return for FASTING labs in May (Lipid, LFT)-lab orders provided   Follow-Up: Your physician wants you to follow-up in: September with Dr. Ellyn Hack. You will receive a  reminder letter in the mail two months in advance. If you don't receive a letter, please call our office to schedule the follow-up appointment.   Any Other Special Instructions Will Be Listed Below (If Applicable).     If you need a refill on your cardiac medications before your next appointment, please call your pharmacy.     Studies Ordered:   Orders Placed This Encounter  Procedures  . Lipid panel  . Hepatic function panel      Shanon Brow  Ellyn Hack, M.D., M.S. Interventional Cardiologist   Pager # [phone] Phone # [phone] [address] [place], [place] [address]    Thank you for choosing [facility] at [facility]!!

## 2017-09-19 NOTE — Patient Instructions (Signed)
Medication Instructions:  Your physician recommends that you continue on your current medications as directed. Please refer to the Current Medication list given to you today.  Labwork: Please return for FASTING labs in May (Lipid, LFT)-lab orders provided   Follow-Up: Your physician wants you to follow-up in: September with Dr. Ellyn Hack. You will receive a reminder letter in the mail two months in advance. If you don't receive a letter, please call our office to schedule the follow-up appointment.   Any Other Special Instructions Will Be Listed Below (If Applicable).     If you need a refill on your cardiac medications before your next appointment, please call your pharmacy.

## 2017-09-21 ENCOUNTER — Encounter: Payer: Self-pay | Admitting: Cardiology

## 2017-09-21 NOTE — Assessment & Plan Note (Signed)
Excellent lipids as of November.  We will recheck in roughly 26months.  Pending on the results, can likely reduce to 40 mg atorvastatin.

## 2017-09-21 NOTE — Assessment & Plan Note (Signed)
Moderate LAD-D1 bifurcation lesion.  Nonischemic symptoms on GXT/PTT.  Therefore we will not consider staged PCI.  Continue to follow. He remains on every other day aspirin plus Plavix.  Would stay on Plavix for at least one year.  Can probably stop aspirin at next visit. Continue atorvastatin at current dose but we will recheck lipids and hopefully reduce dose at that time. Continue low-dose beta-blocker as tolerated.

## 2017-09-21 NOTE — Assessment & Plan Note (Signed)
Aborted anterior STEMI.  The only major coronary artery that had a lesion that could have gone along with his EKG changes and symptoms was the bifurcation LAD-diagonal branch lesion that is moderate at best now.  The decision was made to treat medically and he is doing well. We will continue aggressive risk factor modification as well as DAPT for 1 year.  Follow-up GXT did not show that there was symptomatic evidence of ischemia in this distribution.

## 2017-09-26 ENCOUNTER — Other Ambulatory Visit (HOSPITAL_COMMUNITY): Payer: Self-pay | Admitting: Internal Medicine

## 2017-09-26 NOTE — Progress Notes (Signed)
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  Assessment and Plan:  1.  B-cell CLL/SLL RAI stage 2:  Pt was diagnosed in 06/2015. CT in 2017 showed bulky adenopathy to bilateral hilar, mediastinal, retroperitoneal, mesenteric, and bilateral pelvic lymphadenopathy. Also with mild splenomegaly noted at that time. 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 Peripheral cytogenetic analysis in 10/2015 was normal.  He is complaining of worsening adenopathy especially in the neck area.  Labs done 09/17/2017 showed a white count of 3.3 hemoglobin 13 platelets 82,000 which is decreased from labs that were done in December 2018.  He will be set up for repeat imaging with CT neck,chest,abdomen and pelvis for interval follow-up.  Last imaging done July 21, 2015 show bulky adenopathy that was diffuse as well as splenomegaly and numerous pulmonary nodules. He will return to clinic to go the results of his scans.  2.  Thrombocytopenia.  Platelet count on labs done 09/17/2017 were 82,000 which is decreased from labs that were done in December 2018.  This is likely secondary to his diagnosis of CLL.  The patient will return to clinic to go over scans and for repeat lab evaluation.  3.  Enlarged prostate.  This was noted on prior imaging and he is set up for CT of abdomen and pelvis for interval follow-up.  He will return to clinic to the lab results.  Will consider urology referral.  Interval History:  62 yr old male diagnosed with CLL in 2016.  Pt has remained on observation.    Current Status:  Pt is seen today for follow-up.  He is complaining of enlarging adenopathy in the neck.        Chronic lymphocytic leukemia (CLL), B-cell  ([place])   06/14/2015 Imaging    CT neck- Bulky adenopathy throughout the neck bilaterally. There also are enlarged parotid lymph nodes bilaterally. There is bilateral axillary adenopathy as well as mediastinal adenopathy. Findings are consistent with lymphoma. Biopsy recommended.      06/27/2015 Procedure    Left neck lymph node biopsy by Dr. Benjamine Mola      06/27/2015 Pathology Results    Diagnosis Lymph node for lymphoma, Left neck node for lymphoma work up - [facility].  LOW GRADE.      06/27/2015 Pathology Results    Tissue-Flow Cytometry - MONOCLONAL B CELL POPULATION IDENTIFIED. 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 Dyslipidemia, goal LDL below 70 [E78.5] 06/20/2017  . Neutropenia ([place]) [D70.9] 03/07/2017  . ST elevation myocardial infarction (STEMI) of inferolateral wall, initial episode of care ([place]) [I21.19] 03/05/2017  . Coronary artery disease involving native coronary artery of native heart with angina pectoris ([place]) [I25.119] 03/05/2017  . Chronic lymphocytic leukemia (CLL), B-cell ([place]) [C91.10] 07/11/2015    Past Medical History Past Medical History:  Diagnosis Date  . 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 Enlarged lymph node    left neck  . STEMI (  ST elevation myocardial infarction) ([place]) 03/05/2017   hx/notes 03/05/2017 -likely aborted anterior STEMI with 50% bifurcation LAD-Diag1.  No PCI.  Preserved EF    Past Surgical History Past Surgical History:  Procedure Laterality Date  . COLONOSCOPY N/A 10/11/2015   Procedure: COLONOSCOPY;  Surgeon: Rogene Houston, MD;  [facility];  Service: Endoscopy;   Laterality: N/A;  10/11/2015  . Graded Exercise Tolerance Test (GXT/ETT)  05/2017   10.7 METs (9: 25 min.  Reached 103% max predicted heart rate).  No EKG findings to suggest coronary ischemia.  Negative, low risk GXT  . LEFT HEART CATH AND CORONARY ANGIOGRAPHY N/A 03/05/2017   Procedure: LEFT HEART CATH AND CORONARY ANGIOGRAPHY;  Surgeon: Leonie Man, MD;  Location: [place] [facility];  Service: Cardiovascular: pLAD-Diag1 50-55% (non-flow-limiiting).  EF ~50-55%.  although bifurcation lesion was presumed Culprit - no PCI (not flow limiting). - Med Rx.  [name] MASS BIOPSY Left 06/27/2015   Procedure: OPEN LEFT NECK BIOPSY ;  Surgeon: Leta Baptist, MD;  Location: [place] [facility];  Service: ENT;  Laterality: Left;  . REFRACTIVE SURGERY Bilateral     Family History Family History  Problem Relation Age of Onset  . Diabetes Mother   . Cancer Father   . Cancer Brother      Social History  reports that he has quit smoking. His smoking use included cigarettes. He quit after 2.00 years of use. He has quit using smokeless tobacco. His smokeless tobacco use included chew and snuff. He reports that he drinks alcohol. He reports that he does not use drugs.  Medications  Current Outpatient Medications:  .  Ascorbic Acid (VITAMIN C PO), Take 1 tablet by mouth daily., Disp: , Rfl:  .  aspirin 81 MG chewable tablet, Chew 1 tablet (81 mg total) by mouth every other day., Disp: 30 tablet, Rfl: 11 .  atorvastatin (LIPITOR) 80 MG tablet, Take 1 tablet (80 mg total) by mouth daily at 6 PM., Disp: 30 tablet, Rfl: 11 .  clopidogrel (PLAVIX) 75 MG tablet, Take 1 tablet (75 mg total) by mouth daily with breakfast., Disp: 30 tablet, Rfl: 11 .  metoprolol tartrate (LOPRESSOR) 25 MG tablet, Take 1 tablet (25 mg total) by mouth 2 (two) times daily., Disp: 60 tablet, Rfl: 11 .  nitroGLYCERIN (NITROSTAT) 0.4 MG SL tablet, Place 1 tablet (0.4 mg total) under the tongue every 5 (five) minutes as needed for chest  pain., Disp: 25 tablet, Rfl: 6  Allergies Patient has no known allergies.  Review of Systems Review of Systems - Oncology ROS as per HPI otherwise 12 point ROS is negative.   Physical Exam  Vitals Wt Readings from Last 3 Encounters:  09/19/17 255 lb 12.8 oz (116 kg)  09/17/17 258 lb (117 kg)  06/20/17 257 lb 12.8 oz (116.9 kg)   Temp Readings from Last 3 Encounters:  09/17/17 98.2 F (36.8 C) (Oral)  06/19/17 98.1 F (36.7 C) (Oral)  03/07/17 97.6 F (36.4 C) (Oral)   BP Readings from Last 3 Encounters:  09/19/17 102/66  09/17/17 124/66  06/20/17 118/70   Pulse Readings from Last 3 Encounters:  09/19/17 68  09/17/17 69  06/20/17 66   Constitutional: Well-developed, well-nourished, and in no distress.   HENT: Head: Normocephalic and atraumatic.  Mouth/Throat: No oropharyngeal exudate. Mucosa moist. Eyes: Pupils are equal, round, and reactive to light. Conjunctivae are normal. No scleral icterus.  Neck: Normal range of motion. Neck supple. No JVD present.  Palpable submandibular adenopathy.   Cardiovascular: Normal rate, regular rhythm and normal heart sounds.  Exam reveals no gallop and no friction rub.   No murmur heard. Pulmonary/Chest: Effort normal and breath sounds normal. No respiratory distress. No wheezes.No rales.  Abdominal: Soft. Bowel sounds are normal. No distension. There is no tenderness. There is no guarding.  Musculoskeletal: No edema or tenderness.  Lymphadenopathy: Enlarged submandibular adenopathy.  No cervical or supraclavicular adenopathy.  Neurological: Alert and oriented to person, place, and time. No cranial nerve deficit.  Skin: Skin is warm and dry. No rash noted. No erythema. No pallor.  Psychiatric: Affect and judgment normal.   Labs Lab on 09/17/2017  Component Date Value Ref Range Status  . WBC 09/17/2017 3.3* 4.0 - 10.5 K/uL Final  . RBC 09/17/2017 4.20* 4.22 - 5.81 MIL/uL Final  . Hemoglobin 09/17/2017 13.0  13.0 - 17.0 g/dL  Final  . HCT 09/17/2017 38.7* 39.0 - 52.0 % Final  . MCV 09/17/2017 92.1  78.0 - 100.0 fL Final  . MCH 09/17/2017 31.0  26.0 - 34.0 pg Final  . MCHC 09/17/2017 33.6  30.0 - 36.0 g/dL Final  . RDW 09/17/2017 13.3  11.5 - 15.5 % Final  . Platelets 09/17/2017 82* 150 - 400 K/uL Final   Comment: SPECIMEN CHECKED FOR CLOTS PLATELET COUNT CONFIRMED BY SMEAR   . Neutrophils Relative % 09/17/2017 68  % Final  . Neutro Abs 09/17/2017 2.2  1.7 - 7.7 K/uL Final  . Lymphocytes Relative 09/17/2017 16  % Final  . Lymphs Abs 09/17/2017 0.5* 0.7 - 4.0 K/uL Final  . Monocytes Relative 09/17/2017 13  % Final  . Monocytes Absolute 09/17/2017 0.4  0.1 - 1.0 K/uL Final  . Eosinophils Relative 09/17/2017 3  % Final  . Eosinophils Absolute 09/17/2017 0.1  0.0 - 0.7 K/uL Final  . Basophils Relative 09/17/2017 0  % Final  . Basophils Absolute 09/17/2017 0.0  0.0 - 0.1 K/uL Final   Performed at [facility], [address], [place], [place] [address]  . Sodium 09/17/2017 137  135 - 145 mmol/L Final  . Potassium 09/17/2017 3.6  3.5 - 5.1 mmol/L Final  . Chloride 09/17/2017 101  101 - 111 mmol/L Final  . CO2 09/17/2017 26  22 - 32 mmol/L Final  . Glucose, Bld 09/17/2017 98  65 - 99 mg/dL Final  . BUN 09/17/2017 20  6 - 20 mg/dL Final  . Creatinine, Ser 09/17/2017 0.88  0.61 - 1.24 mg/dL Final  . Calcium 09/17/2017 9.1  8.9 - 10.3 mg/dL Final  . Total Protein 09/17/2017 6.8  6.5 - 8.1 g/dL Final  . Albumin 09/17/2017 4.2  3.5 - 5.0 g/dL Final  . AST 09/17/2017 34  15 - 41 U/L Final  . ALT 09/17/2017 41  17 - 63 U/L Final  . Alkaline Phosphatase 09/17/2017 78  38 - 126 U/L Final  . Total Bilirubin 09/17/2017 1.4* 0.3 - 1.2 mg/dL Final  . GFR calc non Af Amer 09/17/2017 >60  >60 mL/min Final  . GFR calc Af Amer 09/17/2017 >60  >60 mL/min Final   Comment: (NOTE) The eGFR has been calculated using the CKD EPI equation. This calculation has not been validated in all clinical situations. eGFR's persistently <60  mL/min signify possible Chronic Kidney Disease.   Georgiann Hahn gap 09/17/2017 10  5 - 15 Final   Performed at [facility], [address] [place], [place] [address]     Pathology Orders Placed This Encounter  Procedures  . CT SOFT TISSUE NECK W CONTRAST    Standing Status:   Future    Standing Expiration Date:   12/19/2018    Order Specific Question:   If indicated for the ordered procedure, I authorize the administration of contrast media per Radiology protocol    Answer:   Yes    Order Specific Question:   Preferred imaging location?    Answer:   [facility]    Order Specific Question:   Radiology Contrast Protocol - do NOT remove file path    Answer:   [email or web address]  . CT Chest W Contrast    Standing Status:   Future    Standing Expiration Date:   09/17/2018    Order Specific Question:   If indicated for the ordered procedure, I authorize the administration of contrast media per Radiology protocol    Answer:   Yes    Order Specific Question:   Preferred imaging location?    Answer:   [facility]    Order Specific Question:   Radiology Contrast Protocol - do NOT remove file path    Answer:   [email or web address]  . CT Abdomen Pelvis W Contrast    Standing Status:   Future    Standing Expiration Date:   09/17/2018    Order Specific Question:   If indicated for the ordered procedure, I authorize the administration of contrast media per Radiology protocol    Answer:   Yes    Order Specific Question:   Preferred imaging location?    Answer:   [facility]    Order Specific Question:   Radiology Contrast Protocol - do NOT remove file path    Answer:   [email or web address]       Zoila Shutter MD

## 2017-09-26 NOTE — Progress Notes (Deleted)
error 

## 2017-10-03 ENCOUNTER — Ambulatory Visit (HOSPITAL_COMMUNITY): Payer: 59

## 2017-10-03 ENCOUNTER — Ambulatory Visit (HOSPITAL_COMMUNITY)
Admission: RE | Admit: 2017-10-03 | Discharge: 2017-10-03 | Disposition: A | Discharge status: Home / Self Care | Payer: 59 | Source: Ambulatory Visit | Attending: Internal Medicine | Admitting: Internal Medicine

## 2017-10-03 ENCOUNTER — Encounter (HOSPITAL_COMMUNITY): Payer: Self-pay

## 2017-10-03 ENCOUNTER — Other Ambulatory Visit (HOSPITAL_COMMUNITY): Payer: 59

## 2017-10-03 DIAGNOSIS — R591 Generalized enlarged lymph nodes: Secondary | ICD-10-CM | POA: Diagnosis present

## 2017-10-03 DIAGNOSIS — R161 Splenomegaly, not elsewhere classified: Secondary | ICD-10-CM | POA: Diagnosis not present

## 2017-10-03 DIAGNOSIS — R599 Enlarged lymph nodes, unspecified: Secondary | ICD-10-CM

## 2017-10-03 DIAGNOSIS — R918 Other nonspecific abnormal finding of lung field: Secondary | ICD-10-CM | POA: Insufficient documentation

## 2017-10-03 DIAGNOSIS — R590 Localized enlarged lymph nodes: Secondary | ICD-10-CM | POA: Insufficient documentation

## 2017-10-03 MED ORDER — IOPAMIDOL (ISOVUE-300) INJECTION 61%
100.0000 mL | Freq: Once | INTRAVENOUS | Status: AC | PRN
  Administered 2017-10-03: 100 mL via INTRAVENOUS

## 2017-10-07 ENCOUNTER — Encounter (HOSPITAL_COMMUNITY): Payer: Self-pay | Admitting: Internal Medicine

## 2017-10-07 ENCOUNTER — Inpatient Hospital Stay (HOSPITAL_BASED_OUTPATIENT_CLINIC_OR_DEPARTMENT_OTHER): Payer: 59 | Admitting: Internal Medicine

## 2017-10-07 ENCOUNTER — Other Ambulatory Visit: Payer: Self-pay

## 2017-10-07 VITALS — BP 125/66 | HR 64 | Temp 97.6°F | Resp 16 | Wt 261.0 lb

## 2017-10-07 DIAGNOSIS — C9110 Chronic lymphocytic leukemia of B-cell type not having achieved remission: Secondary | ICD-10-CM | POA: Diagnosis not present

## 2017-10-07 NOTE — Patient Instructions (Signed)
[place] at [facility] Discharge Instructions   You were seen today by Dr. Zoila Shutter. She went over your recent scans and lab results. On your recent scans some things were larger than before.  Your recent labs were ok but your platelets were low. Your levels are dropping and your scans are changing so it may be time for you to think about treatment.  We anticipate that your platelets and findings on your scans will be getting better with the treatments. Most patients who have treatments earlier in the disease will have better outcomes and may have a chance of the disease "going away", no symptoms, for a while and can just have observation.  She discussed chemotherapy and Immunotherapy combination. She discussed different regimens and different medication combinations. You wold have 3 cycles, then get repeat scans to check response to treatment, and then continue with the next 3 cycles. Information given on all regimens.  You would need to have a port placed, which we can get scheduled.  You may need to put off your dental work until after treatment.  We will get you scheduled for chemo teaching. We will see you back in about 2 weeks for labs and follow up.     Thank you for choosing [place] at [facility] to provide your oncology and hematology care.  To afford each patient quality time with our provider, please arrive at least 15 minutes before your scheduled appointment time.    If you have a lab appointment with the [place] please come in thru the  Main Entrance and check in at the main information desk  You need to re-schedule your appointment should you arrive 10 or more minutes late.  We strive to give you quality time with our providers, and arriving late affects you and other patients whose appointments are after yours.  Also, if you no show three or more times for appointments you may be dismissed from the clinic at the  providers discretion.     Again, thank you for choosing [facility].  Our hope is that these requests will decrease the amount of time that you wait before being seen by our physicians.       _____________________________________________________________  Should you have questions after your visit to [facility], please contact our office at [phone] between the hours of 8:30 a.m. and 4:30 p.m.  Voicemails left after 4:30 p.m. will not be returned until the following business day.  For prescription refill requests, have your pharmacy contact our office.       Resources For Cancer Patients and their Caregivers ? American Cancer Society: Can assist with transportation, wigs, general needs, runs Look Good Feel Better.        531-050-2440 ? Cancer Care: Provides financial assistance, online support groups, medication/co-pay assistance.  1-800-813-HOPE 513-521-0511) ? [facility] Assists [place] cancer patients and their families through emotional , educational and financial support.  217-064-6447 ? [place] DSS Where to apply for food stamps, Medicaid and utility assistance. (223) 389-0783 ? [facility]: Transportation to medical appointments. 520-207-7727 ? Social Security Administration: May apply for disability if have a Stage IV cancer. 763-367-0242 (660)359-8340 ? [employer], Disability and Transit Services: Assists with nutrition, care and transit needs. [place] Support Programs:   > Cancer Support Group  2nd Tuesday of the month 1pm-2pm, Journey Room   > Creative Journey  3rd Tuesday of the month 1130am-1pm, [place]

## 2017-10-08 ENCOUNTER — Other Ambulatory Visit (HOSPITAL_COMMUNITY): Payer: Self-pay | Admitting: Pharmacist

## 2017-10-09 ENCOUNTER — Inpatient Hospital Stay (HOSPITAL_COMMUNITY): Payer: 59

## 2017-10-09 ENCOUNTER — Telehealth: Payer: Self-pay | Admitting: Cardiology

## 2017-10-09 ENCOUNTER — Other Ambulatory Visit (HOSPITAL_COMMUNITY)
Admission: RE | Admit: 2017-10-09 | Discharge: 2017-10-09 | Disposition: A | Discharge status: Home / Self Care | Payer: 59 | Source: Ambulatory Visit | Attending: Internal Medicine | Admitting: Internal Medicine

## 2017-10-09 DIAGNOSIS — C9110 Chronic lymphocytic leukemia of B-cell type not having achieved remission: Secondary | ICD-10-CM | POA: Insufficient documentation

## 2017-10-09 LAB — CBC WITH DIFFERENTIAL/PLATELET
Basophils Absolute: 0 10*3/uL (ref 0.0–0.1)
Basophils Relative: 0 %
EOS ABS: 0.1 10*3/uL (ref 0.0–0.7)
Eosinophils Relative: 3 %
HEMATOCRIT: 40.2 % (ref 39.0–52.0)
HEMOGLOBIN: 13.2 g/dL (ref 13.0–17.0)
LYMPHS ABS: 0.6 10*3/uL — AB (ref 0.7–4.0)
LYMPHS PCT: 16 %
MCH: 30.2 pg (ref 26.0–34.0)
MCHC: 32.8 g/dL (ref 30.0–36.0)
MCV: 92 fL (ref 78.0–100.0)
Monocytes Absolute: 0.4 10*3/uL (ref 0.1–1.0)
Monocytes Relative: 10 %
NEUTROS ABS: 2.5 10*3/uL (ref 1.7–7.7)
NEUTROS PCT: 71 %
Platelets: 85 10*3/uL — ABNORMAL LOW (ref 150–400)
RBC: 4.37 MIL/uL (ref 4.22–5.81)
RDW: 13.3 % (ref 11.5–15.5)
WBC: 3.6 10*3/uL — AB (ref 4.0–10.5)

## 2017-10-09 LAB — COMPREHENSIVE METABOLIC PANEL
ALK PHOS: 72 U/L (ref 38–126)
ALT: 39 U/L (ref 17–63)
AST: 35 U/L (ref 15–41)
Albumin: 4.6 g/dL (ref 3.5–5.0)
Anion gap: 10 (ref 5–15)
BILIRUBIN TOTAL: 1.9 mg/dL — AB (ref 0.3–1.2)
BUN: 21 mg/dL — ABNORMAL HIGH (ref 6–20)
CALCIUM: 9.4 mg/dL (ref 8.9–10.3)
CO2: 25 mmol/L (ref 22–32)
CREATININE: 0.91 mg/dL (ref 0.61–1.24)
Chloride: 104 mmol/L (ref 101–111)
GFR calc Af Amer: >60 mL/min (ref >60)
GFR calc non Af Amer: >60 mL/min (ref >60)
Glucose, Bld: 82 mg/dL (ref 65–99)
Potassium: 3.6 mmol/L (ref 3.5–5.1)
SODIUM: 139 mmol/L (ref 135–145)
TOTAL PROTEIN: 7.4 g/dL (ref 6.5–8.1)

## 2017-10-09 LAB — LACTATE DEHYDROGENASE: LDH: 192 U/L (ref 98–192)

## 2017-10-09 MED ORDER — IBRUTINIB 280 MG PO TABS: 280.0000 mg | ORAL_TABLET | Freq: Every day | ORAL | 0 refills | Status: DC

## 2017-10-09 NOTE — Telephone Encounter (Signed)
Received a call from Dr. Walden Field at the cancer center. Considering a treatment for his CLL that would necessitate coming off antiplatelet therapy. Chart reviewed. Aborted STEMI last year. Now on DAPT with ASA and Plavix. I think it would be safe to stop Plavix now but he should stay on ASA 81 mg daily.   Peter Martinique MD, [facility]

## 2017-10-10 ENCOUNTER — Encounter (HOSPITAL_COMMUNITY): Payer: Self-pay

## 2017-10-10 LAB — HEPATITIS PANEL, ACUTE
HCV Ab: <0.1 s/co ratio (ref 0.0–0.9)
HEP B S AG: NEGATIVE
Hep A IgM: NEGATIVE
Hep B C IgM: NEGATIVE

## 2017-10-10 LAB — HEPATITIS B SURFACE ANTIGEN: Hepatitis B Surface Ag: NEGATIVE

## 2017-10-10 LAB — HEPATITIS B SURFACE ANTIBODY,QUALITATIVE: Hep B S Ab: NONREACTIVE

## 2017-10-13 ENCOUNTER — Telehealth (HOSPITAL_COMMUNITY): Payer: Self-pay | Admitting: Emergency Medicine

## 2017-10-13 NOTE — Telephone Encounter (Signed)
Pts wife called and was very confused about the therapy or choices of therapy for her husband.  When patient was seen by Adam Reyes he was offered IV chemo and we have set him up for port placement.  Pts wife states that Adam Reyes called the patient and then states that he could take a pill called ibrutinib.  This prescription was written on 10/09/2017.  Adam Reyes had called Adam Reyes and asked if he still needed a port placed on 3/28 and when I spoke with Adam Reyes she stated that she still wanted the port placed for rituxan to be given.  I explained to the wife that I could make them an appt to teach them on the 2 drugs that have been decided but as far as options I could not give them options that were available.  Both Adam Reyes and Adam Reyes have requested 2nd opinion with Adam Reyes since he will be the doctor taking care of Adam Reyes in the future.  They are still going to see Adam Reyes and have the port consult on 4/2 at 11 am and then will see Adam Reyes  On 4/2 at 2:20 pm.

## 2017-10-14 ENCOUNTER — Other Ambulatory Visit: Payer: Self-pay

## 2017-10-14 ENCOUNTER — Ambulatory Visit (INDEPENDENT_AMBULATORY_CARE_PROVIDER_SITE_OTHER): Payer: 59 | Admitting: General Surgery

## 2017-10-14 ENCOUNTER — Encounter (HOSPITAL_COMMUNITY): Payer: Self-pay | Admitting: Hematology

## 2017-10-14 ENCOUNTER — Inpatient Hospital Stay (HOSPITAL_COMMUNITY): Payer: 59 | Attending: Internal Medicine | Admitting: Hematology

## 2017-10-14 ENCOUNTER — Inpatient Hospital Stay (HOSPITAL_COMMUNITY): Payer: 59

## 2017-10-14 ENCOUNTER — Encounter: Payer: Self-pay | Admitting: General Surgery

## 2017-10-14 VITALS — BP 135/76 | HR 61 | Temp 98.4°F | Resp 18 | Ht 75.0 in | Wt 257.0 lb

## 2017-10-14 DIAGNOSIS — C8308 Small cell B-cell lymphoma, lymph nodes of multiple sites: Secondary | ICD-10-CM | POA: Diagnosis not present

## 2017-10-14 DIAGNOSIS — C8300 Small cell B-cell lymphoma, unspecified site: Secondary | ICD-10-CM | POA: Insufficient documentation

## 2017-10-14 DIAGNOSIS — C9110 Chronic lymphocytic leukemia of B-cell type not having achieved remission: Secondary | ICD-10-CM | POA: Diagnosis not present

## 2017-10-14 NOTE — Assessment & Plan Note (Signed)
1.  Stage IV small lymphocytic lymphoma: Adam Reyes is seen in evaluation prior to start of therapy for his SLL.  He was initially diagnosed with small lymphocytic lymphoma in December 2016 by a biopsy of left neck lymph node.  Recent CT of the neck, chest, abdomen and pelvis showed progression of the lymphadenopathy.  Peripheral blood flow cytometry was negative for lymphoproliferative disorder.  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  He denies any fatigue, denies any B symptoms like fevers, night sweats or weight loss.  However he has thrombocytopenia with a platelet count of 85.  He also has leukopenia with white count of 3.6.  No recurrent infections were reported.  This is likely from bone marrow infiltration although splenomegaly could be contributing.  Prior to initiating therapy, I have recommended PET/CT scan to see if there is any transformation.  I have also recommended doing a bone marrow aspiration and biopsy to document the extent of marrow involvement.  We will also send testing for IGHV mutation status,  FISH for del17p, TP53 mutation status, bone marrow karyotype.  I have scheduled a bone marrow biopsy next Thursday.  I will talk to him after the PET/CT scan.  Today on physical examination, he has predominant lymphadenopathy in bilateral neck region, some lymphadenopathy in the axillary region, and right inguinal region.  Spleen is palpable about 3 fingerbreadths below the left costal margin on deep inspiration.  He is going on a vacation for golfing next week.  He would like to delay his port placement until end of this month.

## 2017-10-14 NOTE — Progress Notes (Signed)
[facility] History and Physical  Reason for Referral: Port a catheter placement, CLL (B cell) Referring Physician: Dr. Walden Field Dr. Delton Coombes (Oncology)   Chief Complaint    Cancer      Adam Reyes is a 62 y.o. male.  HPI: Adam Reyes is a 62 yo who has a history of CLL diagnosed in 2106. He has been followed by Oncology with serial imaging, and has never had to receive treatment. Recently he noticed enlarging lymph nodes in his neck, and has recommendations for undergoing chemotherapy for the progression of his disease.  He currently is working, and works a physical job.  Aside from the swelling, he has no other B type symptoms reported. He is scheduled to see Dr. Delton Coombes today to discuss his options as he has been told he would do oral therapy, and then IV, and then possibly both.  He had a STEMI 02/2017 and underwent catheterization without stent placement and has been on Aspirin/ Plavix. He just saw Dr. Ellyn Hack 09/19/2017, and has been doing well. He will continue to Plavix for at least year per the documentation.   Past Medical History:  Diagnosis Date  . 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 Enlarged lymph node    left neck  . STEMI (ST elevation myocardial infarction) ([place]) 03/05/2017   hx/notes 03/05/2017 -likely aborted anterior STEMI with 50% bifurcation LAD-Diag1.  No PCI.  Preserved EF    Past Surgical History:  Procedure Laterality Date  . COLONOSCOPY N/A 10/11/2015   Procedure: COLONOSCOPY;  Surgeon: Rogene Houston, MD;  [facility];  Service: Endoscopy;  Laterality: N/A;  10/11/2015  . Graded Exercise Tolerance Test (GXT/ETT)  05/2017   10.7 METs (9: 25 min.  Reached 103% max predicted heart rate).  No EKG findings to suggest coronary ischemia.  Negative, low risk GXT  . LEFT HEART CATH AND  CORONARY ANGIOGRAPHY N/A 03/05/2017   Procedure: LEFT HEART CATH AND CORONARY ANGIOGRAPHY;  Surgeon: Leonie Man, MD;  Location: [place] CV LAB;  Service: Cardiovascular: pLAD-Diag1 50-55% (non-flow-limiiting).  EF ~50-55%.  although bifurcation lesion was presumed Culprit - no PCI (not flow limiting). - Med Rx.  [name] MASS BIOPSY Left 06/27/2015   Procedure: OPEN LEFT NECK BIOPSY ;  Surgeon: Leta Baptist, MD;  Location: [facility];  Service: ENT;  Laterality: Left;  . REFRACTIVE SURGERY Bilateral     Family History  Problem Relation Age of Onset  . Diabetes Mother   . Cancer Father   . Cancer Brother     Social History   Tobacco Use  . Smoking status: Former Smoker    Years: 2.00    Types: Cigarettes  . Smokeless tobacco: Former Systems developer    Types: Chew, Snuff  . Tobacco comment: 03/06/2017 "quit smoking when I was young; quit chew/snuff in ~ 2008"  Substance Use Topics  . Alcohol use: Yes    Comment: 03/06/2017 "maybe 6 pack beer/month"  . Drug use: No    Medications: I have reviewed the patient's current medications. Allergies as of 10/14/2017   No Known Allergies     Medication List        Accurate as of 10/14/17 12:04 PM. Always use your most recent med list.          aspirin 81 MG chewable  tablet Chew 1 tablet (81 mg total) by mouth every other day.   atorvastatin 80 MG tablet Commonly known as:  LIPITOR Take 1 tablet (80 mg total) by mouth daily at 6 PM.   clopidogrel 75 MG tablet Commonly known as:  PLAVIX Take 1 tablet (75 mg total) by mouth daily with breakfast.   Ibrutinib 280 MG Tabs Take 280 mg by mouth daily.   metoprolol tartrate 25 MG tablet Commonly known as:  LOPRESSOR Take 1 tablet (25 mg total) by mouth 2 (two) times daily.   nitroGLYCERIN 0.4 MG SL tablet Commonly known as:  NITROSTAT Place 1 tablet (0.4 mg total) under the tongue every 5 (five) minutes as needed for chest pain.   VITAMIN C PO Take 1 tablet by mouth daily.          ROS:  A comprehensive review of systems was negative except for: Hematologic/lymphatic: positive for swollen lymph nodes  Blood pressure 135/76, pulse 61, temperature 98.4 F (36.9 C), resp. rate 18, height 6\' 3"  (1.905 m), weight 257 lb (116.6 kg). Physical Exam  Constitutional: He is oriented to person, place, and time and well-developed, well-nourished, and in no distress.  HENT:  Head: Normocephalic.  Eyes: Pupils are equal, round, and reactive to light.  Neck: Normal range of motion. Neck supple.  Cardiovascular: Normal rate and regular rhythm.  Pulmonary/Chest: Effort normal and breath sounds normal.  Abdominal: Soft. He exhibits no distension. There is no tenderness.  Musculoskeletal: Normal range of motion. He exhibits no edema.  Lymphadenopathy:    He has cervical adenopathy.  Neurological: He is alert and oriented to person, place, and time.  Skin: Skin is warm and dry.  Psychiatric: Mood, memory, affect and judgment normal.  Vitals reviewed.   Results: CT neck/ chest/ abd/pelvis- 10/03/17 Personally reviewed- no airway compromise from the adenopathy, trachea open, no obstruction; supraclavicular node on left, cervical lymph nodes bilaterally  IMPRESSION: 1. Extensive adenopathy demonstrated throughout the chest, abdomen and pelvis compatible with history of lymphoma. No prior chest CTs are available for comparison. There are lymph nodes which have decreased in size and others which have increased in size when compared to prior exam. 2. Multiple bilateral pulmonary nodules are demonstrated. The nodules that were visualized on prior CT are stable. 3. Splenomegaly.  Assessment & Plan:  Adam Reyes is a 62 y.o. male with CLL who has progression of the disease nad will need treatment with IV chemotherapy. He is seeing oncology today to answer the remaining questions he has.  He says he wants to do everything for his disease.  He has been well but did have a STEMI  and catheterization 02/2018 and is on Aspirin/ Plavix. He recently saw Dr. Ellyn Hack without any changes.  -Port a cath 10/22/2017, hold Plavix /aspirin 5 days prior, will send Dr. Ellyn Hack a note to verify this is ok with him prior to Friday  -Given the left sided supraclavicular nodes will do right sided port, despite him being right handed.  -See Oncology today to further discuss their plans   All questions were answered to the satisfaction of the patient and family.  The risk and benefits of port a catheter placement were discussed including but not limited to bleeding, infection, malfunction, pneumothorax.  After careful consideration, Adam Reyes has decided to proceed.    Virl Cagey 10/14/2017, 12:04 PM

## 2017-10-14 NOTE — Patient Instructions (Signed)
Implanted Port Home Guide An implanted port is a type of central line that is placed under the skin. Central lines are used to provide IV access when treatment or nutrition needs to be given through a person's veins. Implanted ports are used for long-term IV access. An implanted port may be placed because:  You need IV medicine that would be irritating to the small veins in your hands or arms.  You need long-term IV medicines, such as antibiotics.  You need IV nutrition for a long period.  You need frequent blood draws for lab tests.  You need dialysis.  Implanted ports are usually placed in the chest area, but they can also be placed in the upper arm, the abdomen, or the leg. An implanted port has two main parts:  Reservoir. The reservoir is round and will appear as a small, raised area under your skin. The reservoir is the part where a needle is inserted to give medicines or draw blood.  Catheter. The catheter is a thin, flexible tube that extends from the reservoir. The catheter is placed into a large vein. Medicine that is inserted into the reservoir goes into the catheter and then into the vein.  How will I care for my incision site? Do not get the incision site wet. Bathe or shower as directed by your health care provider. How is my port accessed? Special steps must be taken to access the port:  Before the port is accessed, a numbing cream can be placed on the skin. This helps numb the skin over the port site.  Your health care provider uses a sterile technique to access the port. ? Your health care provider must put on a mask and sterile gloves. ? The skin over your port is cleaned carefully with an antiseptic and allowed to dry. ? The port is gently pinched between sterile gloves, and a needle is inserted into the port.  Only "non-coring" port needles should be used to access the port. Once the port is accessed, a blood return should be checked. This helps ensure that the port  is in the vein and is not clogged.  If your port needs to remain accessed for a constant infusion, a clear (transparent) bandage will be placed over the needle site. The bandage and needle will need to be changed every week, or as directed by your health care provider.  Keep the bandage covering the needle clean and dry. Do not get it wet. Follow your health care provider's instructions on how to take a shower or bath while the port is accessed.  If your port does not need to stay accessed, no bandage is needed over the port.  What is flushing? Flushing helps keep the port from getting clogged. Follow your health care provider's instructions on how and when to flush the port. Ports are usually flushed with saline solution or a medicine called heparin. The need for flushing will depend on how the port is used.  If the port is used for intermittent medicines or blood draws, the port will need to be flushed: ? After medicines have been given. ? After blood has been drawn. ? As part of routine maintenance.  If a constant infusion is running, the port may not need to be flushed.  How long will my port stay implanted? The port can stay in for as long as your health care provider thinks it is needed. When it is time for the port to come out, surgery will be   done to remove it. The procedure is similar to the one performed when the port was put in. When should I seek immediate medical care? When you have an implanted port, you should seek immediate medical care if:  You notice a bad smell coming from the incision site.  You have swelling, redness, or drainage at the incision site.  You have more swelling or pain at the port site or the surrounding area.  You have a fever that is not controlled with medicine.  This information is not intended to replace advice given to you by your health care provider. Make sure you discuss any questions you have with your health care provider. Document  Released: 07/01/2005 Document Revised: 12/07/2015 Document Reviewed: 03/08/2013 Elsevier Interactive Patient Education  2017 Elsevier Inc.  

## 2017-10-14 NOTE — H&P (Signed)
[facility] History and Physical  Reason for Referral: Port a catheter placement, CLL (B cell) Referring Physician: Dr. Walden Field Dr. Delton Coombes (Oncology)   Chief Complaint    Cancer      Adam Reyes is a 62 y.o. male.  HPI: Adam Reyes is a 62 yo who has a history of CLL diagnosed in 2106. He has been followed by Oncology with serial imaging, and has never had to receive treatment. Recently he noticed enlarging lymph nodes in his neck, and has recommendations for undergoing chemotherapy for the progression of his disease.  He currently is working, and works a physical job.  Aside from the swelling, he has no other B type symptoms reported. He is scheduled to see Dr. Delton Coombes today to discuss his options as he has been told he would do oral therapy, and then IV, and then possibly both.  He had a STEMI 02/2017 and underwent catheterization without stent placement and has been on Aspirin/ Plavix. He just saw Dr. Ellyn Hack 09/19/2017, and has been doing well. He will continue to Plavix for at least year per the documentation.   Past Medical History:  Diagnosis Date  . 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 Enlarged lymph node    left neck  . STEMI (ST elevation myocardial infarction) ([place]) 03/05/2017   hx/notes 03/05/2017 -likely aborted anterior STEMI with 50% bifurcation LAD-Diag1.  No PCI.  Preserved EF    Past Surgical History:  Procedure Laterality Date  . COLONOSCOPY N/A 10/11/2015   Procedure: COLONOSCOPY;  Surgeon: Rogene Houston, MD;  [facility];  Service: Endoscopy;  Laterality: N/A;  10/11/2015  . Graded Exercise Tolerance Test (GXT/ETT)  05/2017   10.7 METs (9: 25 min.  Reached 103% max predicted heart rate).  No EKG findings to suggest coronary ischemia.  Negative, low risk GXT  . LEFT HEART CATH AND  CORONARY ANGIOGRAPHY N/A 03/05/2017   Procedure: LEFT HEART CATH AND CORONARY ANGIOGRAPHY;  Surgeon: Leonie Man, MD;  Location: [facility];  Service: Cardiovascular: pLAD-Diag1 50-55% (non-flow-limiiting).  EF ~50-55%.  although bifurcation lesion was presumed Culprit - no PCI (not flow limiting). - Med Rx.  [name] MASS BIOPSY Left 06/27/2015   Procedure: OPEN LEFT NECK BIOPSY ;  Surgeon: Leta Baptist, MD;  Location: [facility];  Service: ENT;  Laterality: Left;  . REFRACTIVE SURGERY Bilateral     Family History  Problem Relation Age of Onset  . Diabetes Mother   . Cancer Father   . Cancer Brother     Social History   Tobacco Use  . Smoking status: Former Smoker    Years: 2.00    Types: Cigarettes  . Smokeless tobacco: Former Systems developer    Types: Chew, Snuff  . Tobacco comment: 03/06/2017 "quit smoking when I was young; quit chew/snuff in ~ 2008"  Substance Use Topics  . Alcohol use: Yes    Comment: 03/06/2017 "maybe 6 pack beer/month"  . Drug use: No    Medications: I have reviewed the patient's current medications. Allergies as of 10/14/2017   No Known Allergies     Medication List        Accurate as of 10/14/17 12:04 PM. Always use your most recent med list.          aspirin 81 MG chewable  tablet Chew 1 tablet (81 mg total) by mouth every other day.   atorvastatin 80 MG tablet Commonly known as:  LIPITOR Take 1 tablet (80 mg total) by mouth daily at 6 PM.   clopidogrel 75 MG tablet Commonly known as:  PLAVIX Take 1 tablet (75 mg total) by mouth daily with breakfast.   Ibrutinib 280 MG Tabs Take 280 mg by mouth daily.   metoprolol tartrate 25 MG tablet Commonly known as:  LOPRESSOR Take 1 tablet (25 mg total) by mouth 2 (two) times daily.   nitroGLYCERIN 0.4 MG SL tablet Commonly known as:  NITROSTAT Place 1 tablet (0.4 mg total) under the tongue every 5 (five) minutes as needed for chest pain.   VITAMIN C PO Take 1 tablet by mouth daily.          ROS:  A comprehensive review of systems was negative except for: Hematologic/lymphatic: positive for swollen lymph nodes  Blood pressure 135/76, pulse 61, temperature 98.4 F (36.9 C), resp. rate 18, height 6\' 3"  (1.905 m), weight 257 lb (116.6 kg). Physical Exam  Constitutional: He is oriented to person, place, and time and well-developed, well-nourished, and in no distress.  HENT:  Head: Normocephalic.  Eyes: Pupils are equal, round, and reactive to light.  Neck: Normal range of motion. Neck supple.  Cardiovascular: Normal rate and regular rhythm.  Pulmonary/Chest: Effort normal and breath sounds normal.  Abdominal: Soft. He exhibits no distension. There is no tenderness.  Musculoskeletal: Normal range of motion. He exhibits no edema.  Lymphadenopathy:    He has cervical adenopathy.  Neurological: He is alert and oriented to person, place, and time.  Skin: Skin is warm and dry.  Psychiatric: Mood, memory, affect and judgment normal.  Vitals reviewed.   Results: CT neck/ chest/ abd/pelvis- 10/03/17 Personally reviewed- no airway compromise from the adenopathy, trachea open, no obstruction; supraclavicular node on left, cervical lymph nodes bilaterally  IMPRESSION: 1. Extensive adenopathy demonstrated throughout the chest, abdomen and pelvis compatible with history of lymphoma. No prior chest CTs are available for comparison. There are lymph nodes which have decreased in size and others which have increased in size when compared to prior exam. 2. Multiple bilateral pulmonary nodules are demonstrated. The nodules that were visualized on prior CT are stable. 3. Splenomegaly.  Assessment & Plan:  Adam Reyes is a 62 y.o. male with CLL who has progression of the disease nad will need treatment with IV chemotherapy. He is seeing oncology today to answer the remaining questions he has.  He says he wants to do everything for his disease.  He has been well but did have a STEMI  and catheterization 02/2018 and is on Aspirin/ Plavix. He recently saw Dr. Ellyn Hack without any changes.  -Port a cath 10/22/2017, hold Plavix /aspirin 5 days prior, will send Dr. Ellyn Hack a note to verify this is ok with him prior to Friday  -Given the left sided supraclavicular nodes will do right sided port, despite him being right handed.  -See Oncology today to further discuss their plans   All questions were answered to the satisfaction of the patient and family.  The risk and benefits of port a catheter placement were discussed including but not limited to bleeding, infection, malfunction, pneumothorax.  After careful consideration, Adam Reyes has decided to proceed.    Virl Cagey 10/14/2017, 12:04 PM

## 2017-10-14 NOTE — Progress Notes (Signed)
 Patient Care Team: Harding, David W, MD as PCP - General (Cardiology)  DIAGNOSIS:  Encounter Diagnosis  Name Primary?  . Lymphoma, small lymphocytic (HCC)     SUMMARY OF ONCOLOGIC HISTORY:   Chronic lymphocytic leukemia (CLL), B-cell (HCC)   06/14/2015 Imaging    CT neck- Bulky adenopathy throughout the neck bilaterally. There also are enlarged parotid lymph nodes bilaterally. There is bilateral axillary adenopathy as well as mediastinal adenopathy. Findings are consistent with lymphoma. Biopsy recommended.      06/27/2015 Procedure    Left neck lymph node biopsy by Dr. Teoh      06/27/2015 Pathology Results    Diagnosis Lymph node for lymphoma, Left neck node for lymphoma work up - SMALL LYMPHOCYTIC LYMPHOMA.  LOW GRADE.      06/27/2015 Pathology Results    Tissue-Flow Cytometry - MONOCLONAL B CELL POPULATION IDENTIFIED. The phenotypic features are consistent with small lymphocytic lymphoma/chronic lymphocytic leukemia and correlate well with the morphology in the lymph node       CHIEF COMPLIANT: Follow-up for SLL.  INTERVAL HISTORY: Adam Reyes is a 62-year-old very pleasant white male who is seen today for follow-up of small lymphocytic lymphoma.  He has been followed up at our clinic since December 2016 when he was diagnosed from left neck lymph node biopsy.  He does not have any B symptoms including fevers, night sweats or weight loss.  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  He does not have any fatigue.  No recurrent infections were reported.  REVIEW OF SYSTEMS:   Constitutional: Denies fevers, chills or abnormal weight loss Eyes: Denies blurriness of vision Ears, nose, mouth, throat, and face: Denies mucositis or sore throat Respiratory: Denies cough, dyspnea or wheezes Cardiovascular: Denies palpitation, chest discomfort Gastrointestinal:  Denies nausea, heartburn or change in bowel habits Skin: Denies abnormal skin rashes Lymphatics:  Denies new lymphadenopathy or easy bruising Neurological:Denies numbness, tingling or new weaknesses Behavioral/Psych: Mood is stable, no new changes  Extremities: No lower extremity edema All other systems were reviewed with the patient and are negative.  I have reviewed the past medical history, past surgical history, social history and family history with the patient and they are unchanged from previous note.  ALLERGIES:  has No Known Allergies.  MEDICATIONS:  Current Outpatient Medications  Medication Sig Dispense Refill  . aspirin 81 MG chewable tablet Chew 1 tablet (81 mg total) by mouth every other day. 30 tablet 11  . atorvastatin (LIPITOR) 80 MG tablet Take 1 tablet (80 mg total) by mouth daily at 6 PM. 30 tablet 11  . clopidogrel (PLAVIX) 75 MG tablet Take 1 tablet (75 mg total) by mouth daily with breakfast. 30 tablet 11  . metoprolol tartrate (LOPRESSOR) 25 MG tablet Take 1 tablet (25 mg total) by mouth 2 (two) times daily. 60 tablet 11  . Ibrutinib 280 MG TABS Take 280 mg by mouth daily. (Patient not taking: Reported on 10/14/2017) 30 tablet 0  . nitroGLYCERIN (NITROSTAT) 0.4 MG SL tablet Place 1 tablet (0.4 mg total) under the tongue every 5 (five) minutes as needed for chest pain. 25 tablet 6  . vitamin C (ASCORBIC ACID) 500 MG tablet Take 500 mg by mouth daily.     No current facility-administered medications for this visit.     PHYSICAL EXAMINATION: ECOG PERFORMANCE STATUS: 0 - Asymptomatic  Vitals:   10/14/17 1502  BP: 125/70  Pulse: 74  Resp: 16  Temp: 98.2 F (36.8   C)  SpO2: 97%   Filed Weights   10/14/17 1502  Weight: 257 lb (116.6 kg)    GENERAL:alert, no distress and comfortable SKIN: skin color, texture, turgor are normal, no rashes or significant lesions EYES: normal, Conjunctiva are pink and non-injected, sclera clear OROPHARYNX:no mucositis, no erythema and lips, buccal mucosa, and tongue normal  NECK: Bilateral level 2 adenopathy with the largest  lymph node measuring at angle of left neck jaw, size of golf wall.  There is also small bilateral axillary adenopathy and right inguinal adenopathy.  Spleen is palpable 3 fingerbreadths below the left costal margin on deep inspiration. LYMPH:  no palpable lymphadenopathy in the cervical, axillary or inguinal LUNGS: clear to auscultation and percussion with normal breathing effort HEART: regular rate & rhythm and no murmurs and no lower extremity edema ABDOMEN:abdomen soft, non-tender and normal bowel sounds MUSCULOSKELETAL:no cyanosis of digits and no clubbing   EXTREMITIES: No lower extremity edema   LABORATORY DATA:  I have reviewed the data as listed CMP Latest Ref Rng & Units 10/09/2017 09/17/2017 06/19/2017  Glucose 65 - 99 mg/dL 82 98 86  BUN 6 - 20 mg/dL 21(H) 20 27(H)  Creatinine 0.61 - 1.24 mg/dL 0.91 0.88 0.90  Sodium 135 - 145 mmol/L 139 137 141  Potassium 3.5 - 5.1 mmol/L 3.6 3.6 3.9  Chloride 101 - 111 mmol/L 104 101 106  CO2 22 - 32 mmol/L _0 Calcium 8.9 - 10.3 mg/dL 9.4 9.1 9.4  Total Protein 6.5 - 8.1 g/dL 7.4 6.8 7.1  Total Bilirubin 0.3 - 1.2 mg/dL 1.9(H) 1.4(H) 1.3(H)  Alkaline Phos 38 - 126 U/L 72 78 79  AST 15 - 41 U/L 35 34 32  ALT 17 - 63 U/L 39 41 36   No results found for: [id number]   Lab Results  Component Value Date   WBC 3.6 (L) 10/09/2017   HGB 13.2 10/09/2017   HCT 40.2 10/09/2017   MCV 92.0 10/09/2017   PLT 85 (L) 10/09/2017   NEUTROABS 2.5 10/09/2017    ASSESSMENT & PLAN:  Lymphoma, small lymphocytic (HCC) 1.  Stage IV small lymphocytic lymphoma: Mr. Stief is seen in evaluation prior to start of therapy for his SLL.  He was initially diagnosed with small lymphocytic lymphoma in December 2016 by a biopsy of left neck lymph node.  Recent CT of the neck, chest, abdomen and pelvis showed progression of the lymphadenopathy.  Peripheral blood flow cytometry was negative for lymphoproliferative disorder.  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  He denies any fatigue, denies any B symptoms like fevers, night sweats or weight loss.  However he has thrombocytopenia with a platelet count of 85.  He also has leukopenia with white count of 3.6.  No recurrent infections were reported.  This is likely from bone marrow infiltration although splenomegaly could be contributing.  Prior to initiating therapy, I have recommended PET/CT scan to see if there is any transformation.  I have also recommended doing a bone marrow aspiration and biopsy to document the extent of marrow involvement.  We will also send testing for IGHV mutation status,  FISH for del17p, TP53 mutation status, bone marrow karyotype.  I have scheduled a bone marrow biopsy next Thursday.  I will talk to him after the PET/CT scan.  Today on physical examination, he has predominant lymphadenopathy in bilateral neck region, some lymphadenopathy in the axillary region, and right inguinal region.  Spleen is palpable about  3 fingerbreadths below the left costal margin on deep inspiration.  He is going on a vacation for golfing next week.  He would like to delay his port placement until end of this month.    I spent 25 minutes talking to the patient of which more than half was spent in counseling and coordination of care.  Orders Placed This Encounter  Procedures  . NM PET Image Initial (PI) Skull Base To Thigh    Order Specific Question:   If indicated for the ordered procedure, I authorize the administration of a radiopharmaceutical per Radiology protocol    Answer:   Yes    Order Specific Question:   Preferred imaging location?    Answer:   [facility]    Order Specific Question:   Radiology Contrast Protocol - do NOT remove file path    Answer:   [email or web address]    Order Specific Question:   Reason for Exam additional comments    Answer:   Small lymphocytic lymphoma, to evaluate for transformation   The patient has a good understanding  of the overall plan. he agrees with it. he will call with any problems that may develop before the next visit here.    , MD 10/14/17     

## 2017-10-15 ENCOUNTER — Ambulatory Visit (HOSPITAL_COMMUNITY): Payer: 59 | Admitting: Hematology

## 2017-10-15 ENCOUNTER — Other Ambulatory Visit (HOSPITAL_COMMUNITY): Payer: 59

## 2017-10-15 ENCOUNTER — Telehealth: Payer: Self-pay | Admitting: General Surgery

## 2017-10-15 NOTE — Telephone Encounter (Signed)
-----   Message from Leonie Man, MD sent at 10/14/2017 11:37 PM EDT ----- Regarding: RE: Surgery Port Placement  Yes - OK to hold for [place].  Glenetta Hew, MD  ----- Message ----- From: Virl Cagey, MD Sent: 10/14/2017  12:09 PM To: Leonie Man, MD, [place] Subject: Surgery [facility] Placement                         Mr. Konopka recently saw you 3/8. He underwent a catheterization without stent placement for possible STEMI 02/2017. He is doing well. He is on Aspirin/ plavix. He needs a port a catheter for chemotherapy as his CLL is progressing.    Plans for [facility] 4/10, and holding aspirin/ plavix on Friday for 5 days prior.   Just want to verify you are good with this and do not think he needs anything additional. Will be sedation/ local procedure.   Curlene Labrum

## 2017-10-16 ENCOUNTER — Inpatient Hospital Stay (HOSPITAL_BASED_OUTPATIENT_CLINIC_OR_DEPARTMENT_OTHER): Payer: 59 | Admitting: Hematology

## 2017-10-16 ENCOUNTER — Other Ambulatory Visit (HOSPITAL_COMMUNITY): Payer: Self-pay

## 2017-10-16 ENCOUNTER — Other Ambulatory Visit: Payer: Self-pay

## 2017-10-16 ENCOUNTER — Encounter (HOSPITAL_COMMUNITY): Payer: Self-pay | Admitting: Hematology

## 2017-10-16 VITALS — BP 120/67 | HR 60 | Temp 98.6°F | Resp 16 | Wt 252.0 lb

## 2017-10-16 DIAGNOSIS — C8300 Small cell B-cell lymphoma, unspecified site: Secondary | ICD-10-CM

## 2017-10-16 DIAGNOSIS — C8308 Small cell B-cell lymphoma, lymph nodes of multiple sites: Secondary | ICD-10-CM | POA: Diagnosis not present

## 2017-10-16 LAB — RETICULOCYTES
RBC.: 4.19 MIL/uL — AB (ref 4.22–5.81)
RETIC COUNT ABSOLUTE: 71.2 10*3/uL (ref 19.0–186.0)
RETIC CT PCT: 1.7 % (ref 0.4–3.1)

## 2017-10-16 LAB — CBC WITH DIFFERENTIAL/PLATELET
BASOS ABS: 0 10*3/uL (ref 0.0–0.1)
Basophils Relative: 0 %
Eosinophils Absolute: 0.1 10*3/uL (ref 0.0–0.7)
Eosinophils Relative: 2 %
HEMATOCRIT: 37.9 % — AB (ref 39.0–52.0)
Hemoglobin: 13.2 g/dL (ref 13.0–17.0)
Lymphocytes Relative: 12 %
Lymphs Abs: 0.4 10*3/uL — ABNORMAL LOW (ref 0.7–4.0)
MCH: 31.5 pg (ref 26.0–34.0)
MCHC: 34.8 g/dL (ref 30.0–36.0)
MCV: 90.5 fL (ref 78.0–100.0)
MONO ABS: 0.4 10*3/uL (ref 0.1–1.0)
MONOS PCT: 12 %
NEUTROS ABS: 2.5 10*3/uL (ref 1.7–7.7)
Neutrophils Relative %: 74 %
Platelets: 78 10*3/uL — ABNORMAL LOW (ref 150–400)
RBC: 4.19 MIL/uL — ABNORMAL LOW (ref 4.22–5.81)
RDW: 13 % (ref 11.5–15.5)
WBC: 3.5 10*3/uL — ABNORMAL LOW (ref 4.0–10.5)

## 2017-10-16 LAB — LACTATE DEHYDROGENASE: LDH: 186 U/L (ref 98–192)

## 2017-10-16 LAB — URIC ACID: Uric Acid, Serum: 6.7 mg/dL (ref 4.4–7.6)

## 2017-10-16 NOTE — Assessment & Plan Note (Signed)
1.  Stage IV small lymphocytic lymphoma: He does not have any B symptoms.  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  He has positive lymphadenopathy in the neck, axilla, and inguinal region.  He also has splenomegaly palpable 3 cm below left costal margin.  Adam Reyes is here for bone marrow aspiration and biopsy.  We have discussed the potential side effects including rare chance of bleeding and infection.  We will also send testing for IGHV mutation status,  FISH for del17p, TP53 mutation status, and Bone marrow karyotype. I have also scheduled him for PET/CT scan on the 15th of this month.  I will see him back after the PET CT scan to discuss the results and further treatment plan.

## 2017-10-16 NOTE — Progress Notes (Signed)
Patient tolerated bone marrow biopsy well.

## 2017-10-16 NOTE — Progress Notes (Signed)
Patient in today for bone marrow biopsy. Consent signed, time out done at 0855. Prepped at 0858. Started procedure at [facility]. Finished at [facility]. Adam Reyes from pathology was present for assistance with biopsy. Vitals obtained before and after procedure. Dressing to the left lower back area clean, dry, intact. No visible swelling noted. Patient positioned on his back for 20 min per MD discretion. No pain reported by patient. Discharge instructions given per Dr. Delton Coombes. Vitals stable and discharged home in stable condition today from clinic ambulatory with wife.   Follow up as scheduled.

## 2017-10-16 NOTE — Patient Instructions (Signed)
[place] at [facility] Discharge Instructions  You had your bone marrow biopsy done today.  Dr. Raliegh Ip informed you that you could go back to work today if you wanted too.  Follow up as scheduled. Call us if you have any questions or concerns.  Call if you start having any bleeding or swelling. Apply pressure to site if its starts bleeding and call the clinic.    Thank you for choosing [place] at [facility] to provide your oncology and hematology care.  To afford each patient quality time with our provider, please arrive at least 15 minutes before your scheduled appointment time.   If you have a lab appointment with the [facility] please come in thru the  Main Entrance and check in at the main information desk  You need to re-schedule your appointment should you arrive 10 or more minutes late.  We strive to give you quality time with our providers, and arriving late affects you and other patients whose appointments are after yours.  Also, if you no show three or more times for appointments you may be dismissed from the clinic at the providers discretion.     Again, thank you for choosing [facility].  Our hope is that these requests will decrease the amount of time that you wait before being seen by our physicians.       _____________________________________________________________  Should you have questions after your visit to [facility], please contact our office at [phone] between the hours of 8:30 a.m. and 4:30 p.m.  Voicemails left after 4:30 p.m. will not be returned until the following business day.  For prescription refill requests, have your pharmacy contact our office.       Resources For Cancer Patients and their Caregivers ? American Cancer Society: Can assist with transportation, wigs, general needs, runs Look Good Feel Better.        317-745-3131 ? Cancer Care: Provides financial  assistance, online support groups, medication/co-pay assistance.  1-800-813-HOPE 580-209-4268) ? [place] Assists [place] cancer patients and their families through emotional , educational and financial support.  239-617-8666 ? [place] DSS Where to apply for food stamps, Medicaid and utility assistance. 802-345-8228 ? [facility]: Transportation to medical appointments. (773) 837-6940 ? Social Security Administration: May apply for disability if have a Stage IV cancer. 936-773-2659 (832)128-8879 ? [employer], Disability and Transit Services: Assists with nutrition, care and transit needs. [place] Support Programs:   > Cancer Support Group  2nd Tuesday of the month 1pm-2pm, Journey Room   > Creative Journey  3rd Tuesday of the month 1130am-1pm, Journey Room

## 2017-10-16 NOTE — Progress Notes (Signed)
Adam Reyes, [place] [address]   CLINIC:  Medical Oncology/Hematology  PCP:  Leonie Man, MD [address] [place] [address] [phone]   REASON FOR VISIT:  Follow-up for small lymphocytic lymphoma.  CURRENT THERAPY: Not yet started.  BRIEF ONCOLOGIC HISTORY:    Chronic lymphocytic leukemia (CLL), B-cell ([place])   06/14/2015 Imaging    CT neck- Bulky adenopathy throughout the neck bilaterally. There also are enlarged parotid lymph nodes bilaterally. There is bilateral axillary adenopathy as well as mediastinal adenopathy. Findings are consistent with lymphoma. Biopsy recommended.      06/27/2015 Procedure    Left neck lymph node biopsy by Dr. Benjamine Mola      06/27/2015 Pathology Results    Diagnosis Lymph node for lymphoma, Left neck node for lymphoma work up - Adam Reyes.  LOW GRADE.      06/27/2015 Pathology Results    Tissue-Flow Cytometry - MONOCLONAL B CELL POPULATION IDENTIFIED. The phenotypic features are consistent with small lymphocytic lymphoma/chronic lymphocytic leukemia and correlate well with the morphology in the lymph node        INTERVAL HISTORY:  Adam Reyes 62 y.o. male returns for routine follow-up on bone marrow biopsy.  He denies any fevers night sweats or weight loss.  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  He does not have any painful lymphadenopathy or pain in his spleen.  He does not complain of any severe fatigue.  No recurrent infections were reported.  REVIEW OF SYSTEMS:  Review of Systems  Constitutional: Negative.   HENT:  Negative.   Respiratory: Negative.   Cardiovascular: Negative.   Gastrointestinal: Negative.   Genitourinary: Negative.    Musculoskeletal: Negative.   Neurological: Negative.   Psychiatric/Behavioral: Negative.      PAST MEDICAL/SURGICAL HISTORY:  Past Medical History:  Diagnosis Date  . Chronic lymphocytic leukemia (CLL),  B-cell (HCC)    Small Cell Lymphoma  . 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 Enlarged lymph node    left neck  . STEMI (ST elevation myocardial infarction) ([place]) 03/05/2017   hx/notes 03/05/2017 -likely aborted anterior STEMI with 50% bifurcation LAD-Diag1.  No PCI.  Preserved EF   Past Surgical History:  Procedure Laterality Date  . COLONOSCOPY N/A 10/11/2015   Procedure: COLONOSCOPY;  Surgeon: Rogene Houston, MD;  [facility];  Service: Endoscopy;  Laterality: N/A;  10/11/2015  . Graded Exercise Tolerance Test (GXT/ETT)  05/2017   10.7 METs (9: 25 min.  Reached 103% max predicted heart rate).  No EKG findings to suggest coronary ischemia.  Negative, low risk GXT  . LEFT HEART CATH AND CORONARY ANGIOGRAPHY N/A 03/05/2017   Procedure: LEFT HEART CATH AND CORONARY ANGIOGRAPHY;  Surgeon: Leonie Man, MD;  Location: [facility];  Service: Cardiovascular: pLAD-Diag1 50-55% (non-flow-limiiting).  EF ~50-55%.  although bifurcation lesion was presumed Culprit - no PCI (not flow limiting). - Med Rx.  [name] MASS BIOPSY Left 06/27/2015   Procedure: OPEN LEFT NECK BIOPSY ;  Surgeon: Leta Baptist, MD;  Location: [facility];  Service: ENT;  Laterality: Left;  . REFRACTIVE SURGERY Bilateral      SOCIAL HISTORY:  Social History   Socioeconomic History  . Marital status: Married    Spouse name: Not on file  . Number of children: Not on file  . Years  of education: Not on file  . Highest education level: Not on file  Occupational History  . Not on file  Social Needs  . Financial resource strain: Not on file  . Food insecurity:    Worry: Not on file    Inability: Not on file  . Transportation needs:    Medical: Not on file    Non-medical: Not on file  Tobacco Use  . Smoking status: Former Smoker    Years: 2.00    Types: Cigarettes  . Smokeless tobacco: Former Systems developer    Types: Chew,  Snuff  . Tobacco comment: 03/06/2017 "quit smoking when I was young; quit chew/snuff in ~ 2008"  Substance and Sexual Activity  . Alcohol use: Yes    Comment: 03/06/2017 "maybe 6 pack beer/month"  . Drug use: No  . Sexual activity: Yes  Lifestyle  . Physical activity:    Days per week: Not on file    Minutes per session: Not on file  . Stress: Not on file  Relationships  . Social connections:    Talks on phone: Not on file    Gets together: Not on file    Attends religious service: Not on file    Active member of club or organization: Not on file    Attends meetings of clubs or organizations: Not on file    Relationship status: Not on file  . Intimate partner violence:    Fear of current or ex partner: Not on file    Emotionally abused: Not on file    Physically abused: Not on file    Forced sexual activity: Not on file  Other Topics Concern  . Not on file  Social History Narrative  . Not on file    FAMILY HISTORY:  Family History  Problem Relation Age of Onset  . Diabetes Mother   . Cancer Father   . Cancer Brother     CURRENT MEDICATIONS:  Outpatient Encounter Medications as of 10/16/2017  Medication Sig Note  . aspirin 81 MG chewable tablet Chew 1 tablet (81 mg total) by mouth every other day.   [name] atorvastatin (LIPITOR) 80 MG tablet Take 1 tablet (80 mg total) by mouth daily at 6 PM.   . clopidogrel (PLAVIX) 75 MG tablet Take 1 tablet (75 mg total) by mouth daily with breakfast.   . Ibrutinib 280 MG TABS Take 280 mg by mouth daily. (Patient not taking: Reported on 10/14/2017) 10/14/2017: NOT STARTED THERAPY COURSE  . metoprolol tartrate (LOPRESSOR) 25 MG tablet Take 1 tablet (25 mg total) by mouth 2 (two) times daily.   . nitroGLYCERIN (NITROSTAT) 0.4 MG SL tablet Place 1 tablet (0.4 mg total) under the tongue every 5 (five) minutes as needed for chest pain.   . vitamin C (ASCORBIC ACID) 500 MG tablet Take 500 mg by mouth daily.    No facility-administered encounter  medications on file as of 10/16/2017.     ALLERGIES:  No Known Allergies   PHYSICAL EXAM:  ECOG Performance status: 0 Vitals:   10/16/17 0835  BP: 113/60  Pulse: 64  Resp: 18  Temp: 97.6 F (36.4 C)  SpO2: 98%   Filed Weights   10/16/17 0835  Weight: 252 lb (114.3 kg)     LABORATORY DATA:  I have reviewed the labs as listed.  CBC    Component Value Date/Time   WBC 3.6 (L) 10/09/2017 1555   RBC 4.37 10/09/2017 1555   HGB 13.2 10/09/2017 1555   HCT  40.2 10/09/2017 1555   PLT 85 (L) 10/09/2017 1555   MCV 92.0 10/09/2017 1555   MCH 30.2 10/09/2017 1555   MCHC 32.8 10/09/2017 1555   RDW 13.3 10/09/2017 1555   LYMPHSABS 0.6 (L) 10/09/2017 1555   MONOABS 0.4 10/09/2017 1555   EOSABS 0.1 10/09/2017 1555   BASOSABS 0.0 10/09/2017 1555   CMP Latest Ref Rng & Units 10/09/2017 09/17/2017 06/19/2017  Glucose 65 - 99 mg/dL 82 98 86  BUN 6 - 20 mg/dL 21(H) 20 27(H)  Creatinine 0.61 - 1.24 mg/dL 0.91 0.88 0.90  Sodium 135 - 145 mmol/L 139 137 141  Potassium 3.5 - 5.1 mmol/L 3.6 3.6 3.9  Chloride 101 - 111 mmol/L 104 101 106  CO2 22 - 32 mmol/L '25 26 27  '$ Calcium 8.9 - 10.3 mg/dL 9.4 9.1 9.4  Total Protein 6.5 - 8.1 g/dL 7.4 6.8 7.1  Total Bilirubin 0.3 - 1.2 mg/dL 1.9(H) 1.4(H) 1.3(H)  Alkaline Phos 38 - 126 U/L 72 78 79  AST 15 - 41 U/L 35 34 32  ALT 17 - 63 U/L 39 41 36            ASSESSMENT & PLAN:   Lymphoma, small lymphocytic (HCC) 1.  Stage IV small lymphocytic lymphoma: He does not have any B symptoms.  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  He has positive lymphadenopathy in the neck, axilla, and inguinal region.  He also has splenomegaly palpable 3 cm below left costal margin.  AdamGammell is here for bone marrow aspiration and biopsy.  We have discussed the potential side effects including rare chance of bleeding and infection.  We will also send testing for IGHV mutation status,  FISH for del17p, TP53 mutation status, and Bone marrow  karyotype. I have also scheduled him for PET/CT scan on the 15th of this month.  I will see him back after the PET CT scan to discuss the results and further treatment plan.    I have also ordered a CBC with differential, peripheral smear, hepatitis panel, beta-2 microglobulin, LDH levels today.    Orders placed this encounter:  Orders Placed This Encounter  Procedures  . CBC with Differential/Platelet  . Pathologist smear review  . Lactate dehydrogenase  . Uric acid  . Hepatitis panel, acute  . Beta 2 microglobulin, serum  . Reticulocytes  . [place], [place] [phone]

## 2017-10-16 NOTE — Progress Notes (Signed)
INDICATION: Small lymphocytic lymphoma    Bone Marrow Biopsy and Aspiration Procedure Note   The patient was identified by name and date of birth, prior to start of the procedure and a timeout was performed.   An informed consent was obtained after discussing potential risks including bleeding, infection and pain.  The left posterior iliac crest was palpated, cleaned with ChloraPrep, and drapes applied.  1% lidocaine is infiltrated into the skin, subcutaneous tissue and periosteum.  Bone marrow was aspirated and smears made.  With the help of Jamshidi needle a core biopsy was obtained.  Pressure was applied to the biopsy site and bandage was placed over the biopsy site. Patient was made to lie on the back for 15 mins prior to discharge.  The procedure was tolerated well. COMPLICATIONS: None BLOOD LOSS: none Patient was discharged home in stable condition to return in 2 weeks to review results.  Patient was provided with post bone marrow biopsy instructions and instructed to call if there was any bleeding or worsening pain.  Specimens sent for flow cytometry, cytogenetics and additional studies.  Signed Derek Jack, MD

## 2017-10-17 ENCOUNTER — Inpatient Hospital Stay (HOSPITAL_COMMUNITY): Admission: RE | Admit: 2017-10-17 | Payer: 59 | Source: Ambulatory Visit

## 2017-10-17 LAB — HEPATITIS PANEL, ACUTE
HCV Ab: <0.1 s/co ratio (ref 0.0–0.9)
Hep A IgM: NEGATIVE
Hep B C IgM: NEGATIVE
Hepatitis B Surface Ag: NEGATIVE

## 2017-10-17 LAB — BETA 2 MICROGLOBULIN, SERUM: Beta-2 Microglobulin: 3.3 mg/L — ABNORMAL HIGH (ref 0.6–2.4)

## 2017-10-20 ENCOUNTER — Encounter (HOSPITAL_COMMUNITY): Payer: Self-pay | Admitting: Internal Medicine

## 2017-10-21 ENCOUNTER — Other Ambulatory Visit (HOSPITAL_COMMUNITY): Payer: Self-pay | Admitting: Pharmacist

## 2017-10-21 LAB — TISSUE HYBRIDIZATION TO NCBH

## 2017-10-22 ENCOUNTER — Telehealth (HOSPITAL_COMMUNITY): Payer: Self-pay | Admitting: Hematology

## 2017-10-22 NOTE — Telephone Encounter (Signed)
Faxed [facility] script to [facility] on  3/28 received fax on 4/10 stating they could not fill script and it was forwarded to [facility]

## 2017-10-27 ENCOUNTER — Ambulatory Visit (HOSPITAL_COMMUNITY)
Admission: RE | Admit: 2017-10-27 | Discharge: 2017-10-27 | Disposition: A | Discharge status: Home / Self Care | Payer: 59 | Source: Ambulatory Visit | Attending: Hematology | Admitting: Hematology

## 2017-10-27 DIAGNOSIS — I700 Atherosclerosis of aorta: Secondary | ICD-10-CM | POA: Insufficient documentation

## 2017-10-27 DIAGNOSIS — I2510 Atherosclerotic heart disease of native coronary artery without angina pectoris: Secondary | ICD-10-CM | POA: Insufficient documentation

## 2017-10-27 DIAGNOSIS — C8593 Non-Hodgkin lymphoma, unspecified, intra-abdominal lymph nodes: Secondary | ICD-10-CM | POA: Insufficient documentation

## 2017-10-27 DIAGNOSIS — C8300 Small cell B-cell lymphoma, unspecified site: Secondary | ICD-10-CM | POA: Insufficient documentation

## 2017-10-27 DIAGNOSIS — C8592 Non-Hodgkin lymphoma, unspecified, intrathoracic lymph nodes: Secondary | ICD-10-CM | POA: Insufficient documentation

## 2017-10-27 DIAGNOSIS — C8596 Non-Hodgkin lymphoma, unspecified, intrapelvic lymph nodes: Secondary | ICD-10-CM | POA: Insufficient documentation

## 2017-10-27 MED ORDER — FLUDEOXYGLUCOSE F - 18 (FDG) INJECTION: 10.0000 | Freq: Once | INTRAVENOUS | Status: DC

## 2017-10-28 ENCOUNTER — Encounter (HOSPITAL_COMMUNITY): Payer: Self-pay | Admitting: Hematology

## 2017-10-28 ENCOUNTER — Ambulatory Visit (HOSPITAL_COMMUNITY): Payer: 59 | Admitting: Hematology

## 2017-10-29 LAB — CHROMOSOME ANALYSIS, BONE MARROW

## 2017-10-29 LAB — TISSUE HYBRIDIZATION (BONE MARROW)-NCBH

## 2017-10-30 ENCOUNTER — Encounter (HOSPITAL_COMMUNITY): Payer: Self-pay

## 2017-10-30 ENCOUNTER — Encounter (HOSPITAL_COMMUNITY)
Admission: RE | Admit: 2017-10-30 | Discharge: 2017-10-30 | Disposition: A | Discharge status: Home / Self Care | Payer: 59 | Source: Ambulatory Visit | Attending: General Surgery | Admitting: General Surgery

## 2017-11-03 ENCOUNTER — Encounter (HOSPITAL_COMMUNITY): Payer: Self-pay | Admitting: Hematology

## 2017-11-03 ENCOUNTER — Inpatient Hospital Stay (HOSPITAL_BASED_OUTPATIENT_CLINIC_OR_DEPARTMENT_OTHER): Payer: 59 | Admitting: Hematology

## 2017-11-03 DIAGNOSIS — C8308 Small cell B-cell lymphoma, lymph nodes of multiple sites: Secondary | ICD-10-CM | POA: Diagnosis not present

## 2017-11-03 DIAGNOSIS — C8300 Small cell B-cell lymphoma, unspecified site: Secondary | ICD-10-CM

## 2017-11-03 NOTE — Assessment & Plan Note (Signed)
1.  Stage IV small lymphocytic lymphoma: He does not have any B symptoms.  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  We have discussed the PET CT scan report which showed diffuse lymphadenopathy with a maximum SUV of 8-9.  There was no clear evidence of transformation.  Hence I did not recommend any further biopsy of the lymph node tissue.  We also talked about the bone marrow biopsy reports.  Bone marrow aspirate and flow cytometry were positive for lymphoma involvement.  Chromosome analysis was normal.  CLL fish panel was also normal.  Unfortunately IGVH and TP 53 mutation analysis were not ordered.  I GVH is unmutated been about 50% of the patient's and usually portends a poor prognosis.  It also indicates less response to traditional chemo immunotherapy.  We will try to talk to the pathologist tomorrow to see if this test can be reordered.  TP 53 mutation analysis captures about 5% of patients which are negative on CLL fish panel.  I have also talked to the patient to see if he is agreeable for a repeat bone marrow aspirate  to obtain further samples.  He is agreeable.  The indication for his treatment at this time is worsening thrombocytopenia.  Questions were encouraged and answered to satisfaction.
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
## 2017-11-03 NOTE — Patient Instructions (Addendum)
[place] at [facility] Discharge Instructions  Today you saw Dr. Delton Coombes.  PET scan reviewed. Several areas lit up. The SUV was less than 12 on your PET scan - which is good. All of the SUV on your PET scan showed a value of 8. So we don't need to re-biopsy any areas.   Your bone marrow biopsy showed 25% positive for the lymphoma.   2 of your bone marrow tests: flow cytometry - positive for lymphoma /Chromosomes - cells look normal/ FISH - normal   The other 2 tests TP53/IGHV were not performed even though they were ordered. 1 test can more than likely be added on to your prior bone marrow biopsy. 1 test can not be added on.   We are going to confirm what tests can be performed on the sample already drawn and we will let you know tomorrow.   No port for now.   We will be glad to fill out your forms for you (DOT forms).     Thank you for choosing [place] at [facility] to provide your oncology and hematology care.  To afford each patient quality time with our provider, please arrive at least 15 minutes before your scheduled appointment time.   If you have a lab appointment with the [facility] please come in thru the  Main Entrance and check in at the main information desk  You need to re-schedule your appointment should you arrive 10 or more minutes late.  We strive to give you quality time with our providers, and arriving late affects you and other patients whose appointments are after yours.  Also, if you no show three or more times for appointments you may be dismissed from the clinic at the providers discretion.     Again, thank you for choosing [facility].  Our hope is that these requests will decrease the amount of time that you wait before being seen by our physicians.       _____________________________________________________________  Should you have questions after your visit to [facility],  please contact our office at [phone] between the hours of 8:30 a.m. and 4:30 p.m.  Voicemails left after 4:30 p.m. will not be returned until the following business day.  For prescription refill requests, have your pharmacy contact our office.       Resources For Cancer Patients and their Caregivers ? American Cancer Society: Can assist with transportation, wigs, general needs, runs Look Good Feel Better.        (503) 649-5748 ? Cancer Care: Provides financial assistance, online support groups, medication/co-pay assistance.  1-800-813-HOPE (786)633-5900) ? [place] Assists [place] cancer patients and their families through emotional , educational and financial support.  519-549-6049 ? [place] DSS Where to apply for food stamps, Medicaid and utility assistance. 763-737-9958 ? [facility]: Transportation to medical appointments. (980)726-2410 ? Social Security Administration: May apply for disability if have a Stage IV cancer. 930-196-3945 951-330-5509 ? [employer], Disability and Transit Services: Assists with nutrition, care and transit needs. [place] Support Programs:   > Cancer Support Group  2nd Tuesday of the month 1pm-2pm, Journey Room   > Creative Journey  3rd Tuesday of the month 1130am-1pm, Journey Room

## 2017-11-04 NOTE — Progress Notes (Signed)
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  Assessment and Plan:  1.  B-cell CLL/SLL RAI stage 4 :  Pt was diagnosed in 06/2015.  Last imaging done July 21, 2015 show bulky adenopathy that was diffuse as well as splenomegaly and numerous pulmonary nodules with bulky adenopathy to bilateral hilar, mediastinal, retroperitoneal, mesenteric, and bilateral pelvic lymphadenopathy. Also with mild splenomegaly noted at that time. 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 Peripheral cytogenetic analysis in 10/2015 was normal.  He is complaining of worsening adenopathy especially in the neck area.  Labs done 09/17/2017 showed a white count of 3.3 hemoglobin 13 platelets 82,000 which is decreased from labs that were done in December 2018.  Pt was set up for repeat imaging with CT neck,chest,abdomen and pelvis which was done on 10/03/2017 and showed  IMPRESSION: Cervical lymphadenopathy consistent with the history of lymphoma. Bulky anterior left cervical lymphadenopathy has progressed while right-sided cervical lymphadenopathy demonstrates mixed changes.  CT CAP done 10/03/2017 showed IMPRESSION: 1. Extensive adenopathy demonstrated throughout the chest, abdomen and pelvis compatible with history of lymphoma. No prior chest CTs are available for comparison. There are lymph nodes which have decreased in size and others which have increased in size when compared to prior exam. 2. Multiple bilateral  pulmonary nodules are demonstrated. The nodules that were visualized on prior CT are stable. 3. Splenomegaly.  Labs done 10/09/2017 show a white count 3.6 hemoglobin 13.2 platelets 85,000 LDH is 192.  Long talk with the patient and his wife today.  I discussed with them that due to the evidence of worsening adenopathy especially in the head and neck area with evidence of thrombocytopenia with a platelet count of 85,000 so he would be a candidate for therapy based on his prior diagnosis of SLL/CLL.  I have discussed with them options of therapy with standard recommendation for fludarabine, Cytoxan and Rituxan.  Side effects of the medication were reviewed with the patient.  Recommendations for this regimen are primarily based on standard recommendations for FCR in younger pts with CLL  He had previously had discussion concerning Ibrutinib  with Dr. Whitney Muse.  I have also provided them information regarding Ibrutinib as an option however due to the bleeding risk associated with the medication with him being on Plavix and aspirin I will discuss the case with his cardiologist.  At a minimum he would have to discontinue Plavix.  He has undergone hepatitis testing which was negative.  He will be set up for teaching and will have the option of FCR versus ibrutinib Rituxan.  If ibrutinib is started will be started a lower dose to determine tolerance of therapy due to his recent therapy with Plavix and aspirin.  The wife was questioning options of CBD oil.    I discussed with them that I would be unable to provide recommendations regarding use of that medication due to his diagnosis of CLL/SLL.  Patient's case also was discussed with pathology to determine if additional testing could be done off of the prior left lymph node biopsy.  Recent flow has been reviewed of the peripheral blood was negative  2.  Thrombocytopenia.  Platelet count on labs done 09/17/2017 were 82,000 which is decreased from labs that were done  in December 2018.  This is likely secondary to his diagnosis of CLL.    Labs done 10/09/2017 showed a white count 3.6 hemoglobin 13.2 platelets 85,000.  Based on the worsening adenopathy as well as evidence of thrombocytopenia I have discussed with them options of therapy with FCR versus Ibrutinib Rituxan.  The patient will return to clinic to go over scans and for repeat lab evaluation.  3.  Enlarged prostate.  This was noted on prior imaging.    Interval History:  62 yr old male diagnosed with CLL in 2016.  Pt has remained on observation.    Current Status:  Pt is seen today for follow-up.  He is here to go over recent CT scans and labs.       Chronic lymphocytic leukemia (CLL), B-cell ([place])   06/14/2015 Imaging    CT neck- Bulky adenopathy throughout the neck bilaterally. There also are enlarged parotid lymph nodes bilaterally. There is bilateral axillary adenopathy as well as mediastinal adenopathy. Findings are consistent with lymphoma. Biopsy recommended.      06/27/2015 Procedure    Left neck lymph node biopsy by Dr. Benjamine Mola      06/27/2015 Pathology Results    Diagnosis Lymph node for lymphoma, Left neck node for lymphoma work up - [place].  LOW GRADE.      06/27/2015 Pathology Results    Tissue-Flow Cytometry - MONOCLONAL B CELL POPULATION IDENTIFIED. The phenotypic features are consistent with small lymphocytic lymphoma/chronic lymphocytic leukemia and correlate well with the morphology in the lymph node        Problem List Patient Active Problem List   Diagnosis Date Noted  . 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 Dyslipidemia, goal LDL below 70 [E78.5] 06/20/2017  . Neutropenia ([place]) [D70.9] 03/07/2017  . ST elevation myocardial infarction (STEMI) of inferolateral wall, initial episode of care ([place]) [I21.19] 03/05/2017  . Coronary artery disease involving native coronary artery of native heart with angina pectoris ([place]) [I25.119]  03/05/2017  . Chronic lymphocytic leukemia (CLL), B-cell ([place]) [C91.10] 07/11/2015    Past Medical History Past Medical History:  Diagnosis Date  . 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 Enlarged lymph node    left neck  . STEMI (ST elevation myocardial infarction) ([place]) 03/05/2017   hx/notes 03/05/2017 -likely aborted anterior STEMI with 50% bifurcation LAD-Diag1.  No PCI.  Preserved EF    Past Surgical History Past Surgical History:  Procedure Laterality Date  . COLONOSCOPY N/A 10/11/2015   Procedure: COLONOSCOPY;  Surgeon: Rogene Houston, MD;  [facility];  Service: Endoscopy;  Laterality: N/A;  10/11/2015  . Graded Exercise Tolerance Test (GXT/ETT)  05/2017   10.7 METs (9: 25 min.  Reached 103% max predicted heart rate).  No EKG findings to suggest coronary ischemia.  Negative, low risk GXT  . LEFT HEART CATH AND CORONARY ANGIOGRAPHY N/A 03/05/2017   Procedure: LEFT HEART CATH AND CORONARY ANGIOGRAPHY;  Surgeon: Leonie Man, MD;  Location: [facility];  Service: Cardiovascular: pLAD-Diag1 50-55% (non-flow-limiiting).  EF ~50-55%.  although bifurcation lesion was presumed Culprit - no PCI (not flow limiting). - Med Rx.  [name] MASS BIOPSY Left 06/27/2015   Procedure: OPEN LEFT NECK BIOPSY ;  Surgeon: Leta Baptist, MD;  Location: [facility];  Service: ENT;  Laterality: Left;  . REFRACTIVE SURGERY Bilateral     Family History Family History  Problem Relation Age of Onset  . Diabetes Mother   . Cancer Father   . Cancer Brother      Social History  reports that he has quit smoking. His smoking use included cigarettes. He quit after 2.00 years of use. He has quit using smokeless tobacco. His smokeless tobacco use included chew and snuff. He reports that he drinks alcohol. He reports that he does not use  drugs.  Medications  Current Outpatient Medications:  .  aspirin 81 MG chewable tablet, Chew 1 tablet (81 mg total) by mouth every other day., Disp: 30 tablet, Rfl: 11 .  atorvastatin (LIPITOR) 80 MG tablet, Take 1 tablet (80 mg total) by mouth daily at 6 PM., Disp: 30 tablet, Rfl: 11 .  clopidogrel (PLAVIX) 75 MG tablet, Take 1 tablet (75 mg total) by mouth daily with breakfast., Disp: 30 tablet, Rfl: 11 .  Ibrutinib 280 MG TABS, Take 280 mg by mouth daily. (Patient not taking: Reported on 10/14/2017), Disp: 30 tablet, Rfl: 0 .  metoprolol tartrate (LOPRESSOR) 25 MG tablet, Take 1 tablet (25 mg total) by mouth 2 (two) times daily., Disp: 60 tablet, Rfl: 11 .  nitroGLYCERIN (NITROSTAT) 0.4 MG SL tablet, Place 1 tablet (0.4 mg total) under the tongue every 5 (five) minutes as needed for chest pain., Disp: 25 tablet, Rfl: 6 .  vitamin C (ASCORBIC ACID) 500 MG tablet, Take 500 mg by mouth daily., Disp: , Rfl:   Allergies Patient has no known allergies.  Review of Systems Review of Systems - Oncology ROS as per HPI otherwise 12 point ROS is negative.   Physical Exam  Vitals Wt Readings from Last 3 Encounters:  11/03/17 251 lb 12.8 oz (114.2 kg)  10/16/17 252 lb (114.3 kg)  10/14/17 257 lb (116.6 kg)   Temp Readings from Last 3 Encounters:  11/03/17 98.3 F (36.8 C) (Oral)  10/16/17 98.6 F (37 C)  10/14/17 98.2 F (36.8 C) (Oral)   BP Readings from Last 3 Encounters:  11/03/17 121/74  10/16/17 120/67  10/14/17 125/70   Pulse Readings from Last 3 Encounters:  11/03/17 71  10/16/17 60  10/14/17 74    Constitutional: Well-developed, well-nourished, and in no distress.  HENT:  Head: Normocephalic and atraumatic.  Mouth/Throat: No oropharyngeal exudate. Mucosa moist. Eyes: Pupils are equal, round, and reactive to light. Conjunctivae are normal. No scleral icterus.  Neck: Normal range of motion. Neck supple. No JVD present.  Cardiovascular: Normal rate, regular rhythm and  normal heart sounds.  Exam reveals no gallop and no friction rub.   No murmur heard. Pulmonary/Chest: Effort normal and breath sounds normal. No respiratory distress. No wheezes.No rales.  Abdominal: Soft. Bowel sounds are normal. No distension. There is no tenderness. There is no guarding.  Musculoskeletal: No edema or tenderness.  Lymphadenopathy: Enlarged palpable adenopathy in the left submandibular and cervical area  Neurological: Alert and oriented to person, place, and time. No cranial nerve deficit.  Skin: Skin is warm and dry. No rash noted. No erythema. No pallor.  Psychiatric: Affect and judgment normal.   Labs No visits with results within 3 Day(s) from this visit.  Latest known visit with results is:  Lab on 09/17/2017  Component Date Value Ref Range Status  . WBC 09/17/2017 3.3* 4.0 - 10.5 K/uL Final  . RBC  09/17/2017 4.20* 4.22 - 5.81 MIL/uL Final  . Hemoglobin 09/17/2017 13.0  13.0 - 17.0 g/dL Final  . HCT 09/17/2017 38.7* 39.0 - 52.0 % Final  . MCV 09/17/2017 92.1  78.0 - 100.0 fL Final  . MCH 09/17/2017 31.0  26.0 - 34.0 pg Final  . MCHC 09/17/2017 33.6  30.0 - 36.0 g/dL Final  . RDW 09/17/2017 13.3  11.5 - 15.5 % Final  . Platelets 09/17/2017 82* 150 - 400 K/uL Final   Comment: SPECIMEN CHECKED FOR CLOTS PLATELET COUNT CONFIRMED BY SMEAR   . Neutrophils Relative % 09/17/2017 68  % Final  . Neutro Abs 09/17/2017 2.2  1.7 - 7.7 K/uL Final  . Lymphocytes Relative 09/17/2017 16  % Final  . Lymphs Abs 09/17/2017 0.5* 0.7 - 4.0 K/uL Final  . Monocytes Relative 09/17/2017 13  % Final  . Monocytes Absolute 09/17/2017 0.4  0.1 - 1.0 K/uL Final  . Eosinophils Relative 09/17/2017 3  % Final  . Eosinophils Absolute 09/17/2017 0.1  0.0 - 0.7 K/uL Final  . Basophils Relative 09/17/2017 0  % Final  . Basophils Absolute 09/17/2017 0.0  0.0 - 0.1 K/uL Final   Performed at [facility], [address], [place], [place] [address]  . Sodium 09/17/2017 137  135 - 145 mmol/L Final   . Potassium 09/17/2017 3.6  3.5 - 5.1 mmol/L Final  . Chloride 09/17/2017 101  101 - 111 mmol/L Final  . CO2 09/17/2017 26  22 - 32 mmol/L Final  . Glucose, Bld 09/17/2017 98  65 - 99 mg/dL Final  . BUN 09/17/2017 20  6 - 20 mg/dL Final  . Creatinine, Ser 09/17/2017 0.88  0.61 - 1.24 mg/dL Final  . Calcium 09/17/2017 9.1  8.9 - 10.3 mg/dL Final  . Total Protein 09/17/2017 6.8  6.5 - 8.1 g/dL Final  . Albumin 09/17/2017 4.2  3.5 - 5.0 g/dL Final  . AST 09/17/2017 34  15 - 41 U/L Final  . ALT 09/17/2017 41  17 - 63 U/L Final  . Alkaline Phosphatase 09/17/2017 78  38 - 126 U/L Final  . Total Bilirubin 09/17/2017 1.4* 0.3 - 1.2 mg/dL Final  . GFR calc non Af Amer 09/17/2017 >60  >60 mL/min Final  . GFR calc Af Amer 09/17/2017 >60  >60 mL/min Final   Comment: (NOTE) The eGFR has been calculated using the CKD EPI equation. This calculation has not been validated in all clinical situations. eGFR's persistently <60 mL/min signify possible Chronic Kidney Disease.   Georgiann Hahn gap 09/17/2017 10  5 - 15 Final   Performed at [facility], [address], [place], [place] [address]     Pathology Orders Placed This Encounter  Procedures  . CBC with Differential/Platelet    Standing Status:   Future    Number of Occurrences:   1    Standing Expiration Date:   10/08/2018  . Comprehensive metabolic panel    Standing Status:   Future    Number of Occurrences:   1    Standing Expiration Date:   10/08/2018  . Lactate dehydrogenase    Standing Status:   Future    Number of Occurrences:   1    Standing Expiration Date:   10/08/2018  . Hepatitis B surface antibody    Standing Status:   Future    Number of Occurrences:   1    Standing Expiration Date:   10/07/2018  . Hepatitis B surface antigen    Standing  Status:   Future    Number of Occurrences:   1    Standing Expiration Date:   10/07/2018  . Hepatitis C RNA quantitative    Standing Status:   Future    Number of Occurrences:   1    Standing  Expiration Date:   10/07/2018  . Hepatitis panel, acute    Standing Status:   Future    Number of Occurrences:   1    Standing Expiration Date:   10/07/2018  . Direct antiglobulin test    Standing Status:   Future    Standing Expiration Date:   10/07/2018       Zoila Shutter MD

## 2017-11-05 ENCOUNTER — Encounter (HOSPITAL_COMMUNITY): Admission: RE | Payer: Self-pay | Source: Ambulatory Visit

## 2017-11-05 ENCOUNTER — Other Ambulatory Visit (HOSPITAL_COMMUNITY): Payer: Self-pay

## 2017-11-05 ENCOUNTER — Ambulatory Visit (HOSPITAL_COMMUNITY): Admission: RE | Admit: 2017-11-05 | Payer: 59 | Source: Ambulatory Visit | Admitting: General Surgery

## 2017-11-05 DIAGNOSIS — C9000 Multiple myeloma not having achieved remission: Secondary | ICD-10-CM

## 2017-11-05 SURGERY — INSERTION, TUNNELED CENTRAL VENOUS DEVICE, WITH PORT
Anesthesia: Choice

## 2017-11-06 ENCOUNTER — Inpatient Hospital Stay (HOSPITAL_BASED_OUTPATIENT_CLINIC_OR_DEPARTMENT_OTHER): Payer: 59 | Admitting: Hematology

## 2017-11-06 ENCOUNTER — Inpatient Hospital Stay (HOSPITAL_COMMUNITY): Payer: 59

## 2017-11-06 ENCOUNTER — Encounter (HOSPITAL_COMMUNITY): Payer: Self-pay | Admitting: Hematology

## 2017-11-06 ENCOUNTER — Encounter (HOSPITAL_COMMUNITY): Payer: Self-pay

## 2017-11-06 VITALS — BP 126/68 | HR 62 | Temp 98.6°F | Resp 18

## 2017-11-06 DIAGNOSIS — C8308 Small cell B-cell lymphoma, lymph nodes of multiple sites: Secondary | ICD-10-CM

## 2017-11-06 DIAGNOSIS — C8300 Small cell B-cell lymphoma, unspecified site: Secondary | ICD-10-CM

## 2017-11-06 LAB — TISSUE HYBRIDIZATION TO NCBH

## 2017-11-06 MED ORDER — LIDOCAINE HCL (PF) 1 % IJ SOLN
INTRAMUSCULAR | Status: AC
  Filled 2017-11-06: qty 10

## 2017-11-06 NOTE — Progress Notes (Signed)
Patient came in for a bone marrow biopsy today. Consent done, time out done 0824. Procedure started at 0826, ended at [phone]. Patient tolerated it well. Vitals stable and discharged home ambulatory from clinic. Discharge instructions and bone marrow biopsy after care instructions given and instructed on . Follow up as scheduled.

## 2017-11-06 NOTE — Progress Notes (Signed)
INDICATION: For additional testing.   Bone Marrow Biopsy and Aspiration Procedure Note   The patient was identified by name and date of birth, prior to start of the procedure and a timeout was performed.   An informed consent was obtained after discussing potential risks including bleeding, infection and pain.  The left posterior iliac crest was palpated, cleaned with ChloraPrep, and drapes applied.  1% lidocaine is infiltrated into the skin, subcutaneous tissue and periosteum. Bone marrow was aspirated and specimen collected into purple top tubes and one green top tube.  Pressure was applied to the biopsy site and bandage was placed over the biopsy site. Patient was made to lie on the back for 15 mins prior to discharge.  The procedure was tolerated well. COMPLICATIONS: None BLOOD LOSS: none Patient was discharged home in stable condition to return in 2 weeks to review results.  Patient was provided with post bone marrow biopsy instructions and instructed to call if there was any bleeding or worsening pain.  Specimens sent for flow cytometry, cytogenetics and additional studies.  Signed Derek Jack, MD

## 2017-11-06 NOTE — Patient Instructions (Signed)
[place] at [facility] Discharge Instructions  Bone marrow biopsy done today. Follow up as scheduled.     Thank you for choosing [place] at [facility] to provide your oncology and hematology care.  To afford each patient quality time with our provider, please arrive at least 15 minutes before your scheduled appointment time.   If you have a lab appointment with the [facility] please come in thru the  Main Entrance and check in at the main information desk  You need to re-schedule your appointment should you arrive 10 or more minutes late.  We strive to give you quality time with our providers, and arriving late affects you and other patients whose appointments are after yours.  Also, if you no show three or more times for appointments you may be dismissed from the clinic at the providers discretion.     Again, thank you for choosing [facility].  Our hope is that these requests will decrease the amount of time that you wait before being seen by our physicians.       _____________________________________________________________  Should you have questions after your visit to [facility], please contact our office at [phone] between the hours of 8:30 a.m. and 4:30 p.m.  Voicemails left after 4:30 p.m. will not be returned until the following business day.  For prescription refill requests, have your pharmacy contact our office.       Resources For Cancer Patients and their Caregivers ? American Cancer Society: Can assist with transportation, wigs, general needs, runs Look Good Feel Better.        5175714071 ? Cancer Care: Provides financial assistance, online support groups, medication/co-pay assistance.  1-800-813-HOPE 9040049909) ? [place] Assists [place] cancer patients and their families through emotional , educational and financial support.   336 084 2482 ? [place] DSS Where to apply for food stamps, Medicaid and utility assistance. 770-109-0212 ? [facility]: Transportation to medical appointments. 639 873 6389 ? Social Security Administration: May apply for disability if have a Stage IV cancer. 902-187-4325 (715)015-6171 ? [employer], Disability and Transit Services: Assists with nutrition, care and transit needs. [place] Support Programs:   > Cancer Support Group  2nd Tuesday of the month 1pm-2pm, Journey Room   > Creative Journey  3rd Tuesday of the month 1130am-1pm, Journey Room       Bone Marrow Aspiration and Bone Marrow Biopsy, Adult, Care After This sheet gives you information about how to care for yourself after your procedure. Your health care provider may also give you more specific instructions. If you have problems or questions, contact your health care provider. What can I expect after the procedure? After the procedure, it is common to have:  Mild pain and tenderness.  Swelling.  Bruising.  Follow these instructions at home:    Take over-the-counter or prescription medicines only as told by your health care provider.  Do not take baths, swim, or use a hot tub until your health care provider approves. Ask if you can take a shower or have a sponge bath.  Follow instructions from your health care provider about how to take care of the puncture site. Make sure you: ? Wash your hands with soap and water before you change your bandage (dressing). If soap and water are not available, use hand sanitizer. ? Change your dressing as told by your health care provider.  Check your puncture  siteevery day for signs of infection. Check for: ? More redness, swelling, or pain. ? More fluid or blood. ? Warmth. ? Pus or a bad smell.  Return to your normal activities as told by your health care provider. Ask your health care provider what activities are safe for you.  Do not  drive for 24 hours if you were given a medicine to help you relax (sedative).  Keep all follow-up visits as told by your health care provider. This is important. Contact a health care provider if:  You have more redness, swelling, or pain around the puncture site.  You have more fluid or blood coming from the puncture site.  Your puncture site feels warm to the touch.  You have pus or a bad smell coming from the puncture site.  You have a fever.  Your pain is not controlled with medicine. This information is not intended to replace advice given to you by your health care provider. Make sure you discuss any questions you have with your health care provider. Document Released: 01/18/2005 Document Revised: 01/19/2016 Document Reviewed: 12/13/2015 Elsevier Interactive Patient Education  2018 [employer].

## 2017-11-07 ENCOUNTER — Telehealth (HOSPITAL_COMMUNITY): Payer: Self-pay | Admitting: *Deleted

## 2017-11-10 ENCOUNTER — Other Ambulatory Visit (HOSPITAL_COMMUNITY): Payer: Self-pay | Admitting: Pharmacist

## 2017-11-11 ENCOUNTER — Encounter (HOSPITAL_COMMUNITY): Payer: Self-pay

## 2017-11-11 LAB — MISC LABCORP TEST (SEND OUT): Labcorp test code: 113753

## 2017-11-20 ENCOUNTER — Other Ambulatory Visit: Payer: Self-pay

## 2017-11-20 ENCOUNTER — Encounter (HOSPITAL_COMMUNITY): Payer: Self-pay | Admitting: Hematology

## 2017-11-20 ENCOUNTER — Inpatient Hospital Stay (HOSPITAL_COMMUNITY): Payer: 59 | Attending: Internal Medicine | Admitting: Hematology

## 2017-11-20 ENCOUNTER — Telehealth (HOSPITAL_COMMUNITY): Payer: Self-pay | Admitting: Emergency Medicine

## 2017-11-20 ENCOUNTER — Telehealth (HOSPITAL_COMMUNITY): Payer: Self-pay | Admitting: Hematology

## 2017-11-20 VITALS — BP 115/68 | HR 60 | Resp 16 | Wt 258.4 lb

## 2017-11-20 DIAGNOSIS — C9110 Chronic lymphocytic leukemia of B-cell type not having achieved remission: Secondary | ICD-10-CM | POA: Insufficient documentation

## 2017-11-20 DIAGNOSIS — C8589 Other specified types of non-Hodgkin lymphoma, extranodal and solid organ sites: Secondary | ICD-10-CM | POA: Diagnosis present

## 2017-11-20 DIAGNOSIS — C8300 Small cell B-cell lymphoma, unspecified site: Secondary | ICD-10-CM

## 2017-11-20 MED ORDER — IBRUTINIB 420 MG PO TABS: 1.0000 | ORAL_TABLET | Freq: Every day | ORAL | 1 refills | Status: DC

## 2017-11-20 NOTE — Progress Notes (Signed)
Patient on plan of care prior to pathways. 

## 2017-11-20 NOTE — Telephone Encounter (Signed)
Faxed imburvica script to [facility] and submitted PA to [facility]

## 2017-11-20 NOTE — Telephone Encounter (Signed)
Pt called to let us know that Dr Alfonso Ellis be able to do his gum surgery until 12/17/2017.  Mike Craze NP notified.  Pt has made the appt to proceed with gum surgery.  Pt will come back in 1 week to talk about what chemo opion he has chosen.

## 2017-11-20 NOTE — Progress Notes (Signed)
START OFF PATHWAY REGIMEN - Lymphoma and CLL   [id number]:Ibrutinib 420 mg PO Daily D1-28 q28 Days:   A cycle is every 28 days:     Ibrutinib   **Always confirm dose/schedule in your pharmacy ordering system**    Patient Characteristics: Small Lymphocytic Lymphoma (SLL), First Line, Treatment Indicated, 17p del (-) or Unknown, Fit Patient, Age < 23, IGHV Mutation (+) Disease Type: Small Lymphocytic Lymphoma (SLL) Disease Type: Not Applicable Disease Type: Not Applicable [place] Stage: IV Line of therapy: First Line Treatment Indicated<= Treatment Indicated 17p Deletion Status: Negative Fit or Frail Patient<= Fit Patient Patient Age: Age < 65 IGHV Mutation: IGHV Mutation (+) Intent of Therapy: Non-Curative / Palliative Intent, Discussed with Patient

## 2017-11-20 NOTE — Assessment & Plan Note (Signed)
1.  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  No evidence of transformation. 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  - Bone marrow aspirate and flow cytometry positive for lymphoma involvement, chromosome analysis normal, CLL fish panel normal -We talked about treatment options including ibrutinib versus traditional chemotherapy like FCR.  Typically chemotherapy is given for 6 months whereas Ibrutinib is taken until disease progression.  He is also on Plavix which precludes him to receive ibrutinib.  I will talk to his cardiologist to see if he can come off of Plavix.  If he cannot come off of Plavix, we will consider FCR.  We discussed the side effects of both regimens in detail.  I also talked to him about addition of rituximab to Ibrutinib which increased complete response rates.  We will plan to add it during cycle 2.  Patient is also concerned about the cost of Ibrutinib.  We will forward the information to our financial counselor to see if his insurance covers Ibrutinib.  Questions were encouraged and answered to his satisfaction.  He will think about the options and let us know his decision in a week.

## 2017-11-20 NOTE — Progress Notes (Signed)
Adam Reyes, [place] [address]   CLINIC:  Medical Oncology/Hematology  PCP:  Asencion Noble, MD [address] [place] [address] [phone]   REASON FOR VISIT:  Follow-up for Small lymphocytic lymphoma/B-cell CLL   CURRENT THERAPY: Further work-up/evaluation   BRIEF ONCOLOGIC HISTORY:    Chronic lymphocytic leukemia (CLL), B-cell ([facility])   06/14/2015 Imaging    CT neck- Bulky adenopathy throughout the neck bilaterally. There also are enlarged parotid lymph nodes bilaterally. There is bilateral axillary adenopathy as well as mediastinal adenopathy. Findings are consistent with lymphoma. Biopsy recommended.      06/27/2015 Procedure    Left neck lymph node biopsy by Dr. Benjamine Mola      06/27/2015 Pathology Results    Diagnosis Lymph node for lymphoma, Left neck node for lymphoma work up - [place].  LOW GRADE.      06/27/2015 Pathology Results    Tissue-Flow Cytometry - MONOCLONAL B CELL POPULATION IDENTIFIED. The phenotypic features are consistent with small lymphocytic lymphoma/chronic lymphocytic leukemia and correlate well with the morphology in the lymph node        INTERVAL HISTORY:  Adam Reyes 61 y.o. male returns for follow-up to discuss recent bone marrow biopsy results and treatment planning for SLL/CLL.   Here today with his wife.   IgVH somatic mutation is positive.  TP53 negative.  Prognosis and possible treatment options discussed today. Chemo options include FCR (Fludarabine, Cytoxan, Rituxan) every 4 weeks x 6 cycles total. Another option is Imbruvica +/- Rituxan.  Risks/benefits and considerations for each treatment option discussed today.     REVIEW OF SYSTEMS:  Review of Systems  Hematological: Bruises/bleeds easily.  All other systems reviewed and are negative.    PAST MEDICAL/SURGICAL HISTORY:  Past Medical History:  Diagnosis Date  . Chronic lymphocytic leukemia (CLL), B-cell (H[facility])      Small Cell Lymphoma  . 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 Enlarged lymph node    left neck  . STEMI (ST elevation myocardial infarction) ([facility]) 03/05/2017   hx/notes 03/05/2017 -likely aborted anterior STEMI with 50% bifurcation LAD-Diag1.  No PCI.  Preserved EF   Past Surgical History:  Procedure Laterality Date  . COLONOSCOPY N/A 10/11/2015   Procedure: COLONOSCOPY;  Surgeon: Rogene Houston, MD;  [facility];  Service: Endoscopy;  Laterality: N/A;  10/11/2015  . Graded Exercise Tolerance Test (GXT/ETT)  05/2017   10.7 METs (9: 25 min.  Reached 103% max predicted heart rate).  No EKG findings to suggest coronary ischemia.  Negative, low risk GXT  . LEFT HEART CATH AND CORONARY ANGIOGRAPHY N/A 03/05/2017   Procedure: LEFT HEART CATH AND CORONARY ANGIOGRAPHY;  Surgeon: Leonie Man, MD;  Location: [facility];  Service: Cardiovascular: pLAD-Diag1 50-55% (non-flow-limiiting).  EF ~50-55%.  although bifurcation lesion was presumed Culprit - no PCI (not flow limiting). - Med Rx.  [name] MASS BIOPSY Left 06/27/2015   Procedure: OPEN LEFT NECK BIOPSY ;  Surgeon: Leta Baptist, MD;  Location: [facility];  Service: ENT;  Laterality: Left;  . REFRACTIVE SURGERY Bilateral      SOCIAL HISTORY:  Social History   Socioeconomic History  . Marital status: Married    Spouse name: Not on file  . Number of children: Not on file  . Years of education: Not on file  . Highest education level: Not  on file  Occupational History  . Not on file  Social Needs  . Financial resource strain: Not on file  . Food insecurity:    Worry: Not on file    Inability: Not on file  . Transportation needs:    Medical: Not on file    Non-medical: Not on file  Tobacco Use  . Smoking status: Former Smoker    Years: 2.00    Types: Cigarettes  . Smokeless tobacco: Former Systems developer    Types: Chew, Snuff  . Tobacco  comment: 03/06/2017 "quit smoking when I was young; quit chew/snuff in ~ 2008"  Substance and Sexual Activity  . Alcohol use: Yes    Comment: 03/06/2017 "maybe 6 pack beer/month"  . Drug use: No  . Sexual activity: Yes  Lifestyle  . Physical activity:    Days per week: Not on file    Minutes per session: Not on file  . Stress: Not on file  Relationships  . Social connections:    Talks on phone: Not on file    Gets together: Not on file    Attends religious service: Not on file    Active member of club or organization: Not on file    Attends meetings of clubs or organizations: Not on file    Relationship status: Not on file  . Intimate partner violence:    Fear of current or ex partner: Not on file    Emotionally abused: Not on file    Physically abused: Not on file    Forced sexual activity: Not on file  Other Topics Concern  . Not on file  Social History Narrative  . Not on file    FAMILY HISTORY:  Family History  Problem Relation Age of Onset  . Diabetes Mother   . Cancer Father   . Cancer Brother     CURRENT MEDICATIONS:  Outpatient Encounter Medications as of 11/20/2017  Medication Sig Note  . aspirin 81 MG chewable tablet Chew 1 tablet (81 mg total) by mouth every other day.   [name] atorvastatin (LIPITOR) 80 MG tablet Take 1 tablet (80 mg total) by mouth daily at 6 PM.   . clopidogrel (PLAVIX) 75 MG tablet Take 1 tablet (75 mg total) by mouth daily with breakfast.   . metoprolol tartrate (LOPRESSOR) 25 MG tablet Take 1 tablet (25 mg total) by mouth 2 (two) times daily.   . nitroGLYCERIN (NITROSTAT) 0.4 MG SL tablet Place 1 tablet (0.4 mg total) under the tongue every 5 (five) minutes as needed for chest pain.   . vitamin C (ASCORBIC ACID) 500 MG tablet Take 500 mg by mouth daily.   . [DISCONTINUED] Ibrutinib 280 MG TABS Take 280 mg by mouth daily. 10/14/2017: NOT STARTED THERAPY COURSE  . Ibrutinib 420 MG TABS Take 1 capsule by mouth daily. Take same time every day with a  glass of water    No facility-administered encounter medications on file as of 11/20/2017.     ALLERGIES:  No Known Allergies   PHYSICAL EXAM:  ECOG Performance status: 0  Vitals:   11/20/17 0951  BP: 115/68  Pulse: 60  Resp: 16  SpO2: 98%   Filed Weights   11/20/17 0951  Weight: 258 lb 6.4 oz (117.2 kg)    Physical Exam Deferred.  LABORATORY DATA:  I have reviewed the labs as listed.  CBC    Component Value Date/Time   WBC 3.5 (L) 10/16/2017 0926   RBC 4.19 (L) 10/16/2017 6269  RBC 4.19 (L) 10/16/2017 0926   HGB 13.2 10/16/2017 0926   HCT 37.9 (L) 10/16/2017 0926   PLT 78 (L) 10/16/2017 0926   MCV 90.5 10/16/2017 0926   MCH 31.5 10/16/2017 0926   MCHC 34.8 10/16/2017 0926   RDW 13.0 10/16/2017 0926   LYMPHSABS 0.4 (L) 10/16/2017 0926   MONOABS 0.4 10/16/2017 0926   EOSABS 0.1 10/16/2017 0926   BASOSABS 0.0 10/16/2017 0926   CMP Latest Ref Rng & Units 10/09/2017 09/17/2017 06/19/2017  Glucose 65 - 99 mg/dL 82 98 86  BUN 6 - 20 mg/dL 21(H) 20 27(H)  Creatinine 0.61 - 1.24 mg/dL 0.91 0.88 0.90  Sodium 135 - 145 mmol/L 139 137 141  Potassium 3.5 - 5.1 mmol/L 3.6 3.6 3.9  Chloride 101 - 111 mmol/L 104 101 106  CO2 22 - 32 mmol/L '25 26 27  '$ Calcium 8.9 - 10.3 mg/dL 9.4 9.1 9.4  Total Protein 6.5 - 8.1 g/dL 7.4 6.8 7.1  Total Bilirubin 0.3 - 1.2 mg/dL 1.9(H) 1.4(H) 1.3(H)  Alkaline Phos 38 - 126 U/L 72 78 79  AST 15 - 41 U/L 35 34 32  ALT 17 - 63 U/L 39 41 36    PATHOLOGY: Bone marrow biopsy 10/16/17     Cytogenetics: 10/16/17      DIAGNOSTIC IMAGING:  I have independently reviewed PET CT scan images and agree with the report.     ASSESSMENT & PLAN:   Lymphoma, small lymphocytic (HCC) 1.  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  No evidence of transformation. 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  He continues to work as a Administrator.   Indication for treatment is thrombocytopenia.  - Bone marrow aspirate and flow cytometry positive for lymphoma involvement, chromosome analysis normal, CLL fish panel normal -We talked about treatment options including ibrutinib versus traditional chemotherapy like FCR.  Typically chemotherapy is given for 6 months whereas Ibrutinib is taken until disease progression.  He is also on Plavix which precludes him to receive ibrutinib.  I will talk to his cardiologist to see if he can come off of Plavix.  If he cannot come off of Plavix, we will consider FCR.  We discussed the side effects of both regimens in detail.  I also talked to him about addition of rituximab to Ibrutinib which increased complete response rates.  We will plan to add it during cycle 2.  Patient is also concerned about the cost of Ibrutinib.  We will forward the information to our financial counselor to see if his insurance covers Ibrutinib.  Questions were encouraged and answered to his satisfaction.  He will think about the options and let us know his decision in a week.   Total time spent is 40 minutes with more than 50% of the time spent face-to-face discussing various treatment options, side effects and coordination of care.      Derek Jack, MD [place] [phone]

## 2017-11-26 ENCOUNTER — Telehealth (HOSPITAL_COMMUNITY): Payer: Self-pay | Admitting: Hematology

## 2017-11-26 NOTE — Telephone Encounter (Signed)
[facility] [place] [facility] COPAY IS $10 AND THE PT IS AWARE. PT ADVISED THEM THAT HE WOULD CONTACT THEM WHEN HE MADE A DECISION ABOUT HIS TX

## 2017-11-27 ENCOUNTER — Inpatient Hospital Stay (HOSPITAL_COMMUNITY): Payer: 59

## 2017-11-27 ENCOUNTER — Inpatient Hospital Stay (HOSPITAL_COMMUNITY): Payer: 59 | Attending: Hematology | Admitting: Hematology

## 2017-11-27 ENCOUNTER — Encounter (HOSPITAL_COMMUNITY): Payer: Self-pay | Admitting: Hematology

## 2017-11-27 VITALS — BP 119/49 | HR 71 | Temp 98.7°F | Resp 16 | Wt 255.0 lb

## 2017-11-27 DIAGNOSIS — C8589 Other specified types of non-Hodgkin lymphoma, extranodal and solid organ sites: Secondary | ICD-10-CM | POA: Diagnosis not present

## 2017-11-27 DIAGNOSIS — C9000 Multiple myeloma not having achieved remission: Secondary | ICD-10-CM

## 2017-11-27 DIAGNOSIS — C8300 Small cell B-cell lymphoma, unspecified site: Secondary | ICD-10-CM | POA: Diagnosis not present

## 2017-11-27 DIAGNOSIS — C9110 Chronic lymphocytic leukemia of B-cell type not having achieved remission: Secondary | ICD-10-CM

## 2017-11-27 LAB — CBC WITH DIFFERENTIAL/PLATELET
BASOS ABS: 0 10*3/uL (ref 0.0–0.1)
Basophils Relative: 0 %
EOS ABS: 0.1 10*3/uL (ref 0.0–0.7)
EOS PCT: 2 %
HCT: 40.2 % (ref 39.0–52.0)
HEMOGLOBIN: 13.6 g/dL (ref 13.0–17.0)
Lymphocytes Relative: 14 %
Lymphs Abs: 0.6 10*3/uL — ABNORMAL LOW (ref 0.7–4.0)
MCH: 30.7 pg (ref 26.0–34.0)
MCHC: 33.8 g/dL (ref 30.0–36.0)
MCV: 90.7 fL (ref 78.0–100.0)
Monocytes Absolute: 0.5 10*3/uL (ref 0.1–1.0)
Monocytes Relative: 12 %
NEUTROS PCT: 72 %
Neutro Abs: 3.1 10*3/uL (ref 1.7–7.7)
PLATELETS: 75 10*3/uL — AB (ref 150–400)
RBC: 4.43 MIL/uL (ref 4.22–5.81)
RDW: 13.3 % (ref 11.5–15.5)
WBC: 4.3 10*3/uL (ref 4.0–10.5)

## 2017-11-27 LAB — COMPREHENSIVE METABOLIC PANEL
ALBUMIN: 4.6 g/dL (ref 3.5–5.0)
ALT: 47 U/L (ref 17–63)
AST: 38 U/L (ref 15–41)
Alkaline Phosphatase: 67 U/L (ref 38–126)
Anion gap: 10 (ref 5–15)
BUN: 24 mg/dL — AB (ref 6–20)
CHLORIDE: 104 mmol/L (ref 101–111)
CO2: 27 mmol/L (ref 22–32)
Calcium: 9.6 mg/dL (ref 8.9–10.3)
Creatinine, Ser: 1.01 mg/dL (ref 0.61–1.24)
GFR calc Af Amer: >60 mL/min (ref >60)
GLUCOSE: 86 mg/dL (ref 65–99)
POTASSIUM: 3.9 mmol/L (ref 3.5–5.1)
SODIUM: 141 mmol/L (ref 135–145)
Total Bilirubin: 1.6 mg/dL — ABNORMAL HIGH (ref 0.3–1.2)
Total Protein: 7.1 g/dL (ref 6.5–8.1)

## 2017-11-27 LAB — LACTATE DEHYDROGENASE: LDH: 195 U/L — AB (ref 98–192)

## 2017-11-27 LAB — URIC ACID: Uric Acid, Serum: 7.7 mg/dL — ABNORMAL HIGH (ref 4.4–7.6)

## 2017-11-27 LAB — PHOSPHORUS: PHOSPHORUS: 4.1 mg/dL (ref 2.5–4.6)

## 2017-11-27 MED ORDER — ALLOPURINOL 300 MG PO TABS: 300.0000 mg | ORAL_TABLET | Freq: Two times a day (BID) | ORAL | 0 refills | Status: DC

## 2017-11-27 NOTE — Progress Notes (Signed)
[place],  [id number]   CLINIC:  Medical Oncology/Hematology  PCP:  Asencion Noble, MD [address] [place] [address] [phone]   REASON FOR VISIT:  Follow-up for Small lymphocytic lymphoma/B-cell CLL   CURRENT THERAPY: Treatment decision   BRIEF ONCOLOGIC HISTORY:    Chronic lymphocytic leukemia (CLL), B-cell ([place])   06/14/2015 Imaging    CT neck- Bulky adenopathy throughout the neck bilaterally. There also are enlarged parotid lymph nodes bilaterally. There is bilateral axillary adenopathy as well as mediastinal adenopathy. Findings are consistent with lymphoma. Biopsy recommended.      06/27/2015 Procedure    Left neck lymph node biopsy by Dr. Benjamine Mola      06/27/2015 Pathology Results    Diagnosis Lymph node for lymphoma, Left neck node for lymphoma work up - [place].  LOW GRADE.      06/27/2015 Pathology Results    Tissue-Flow Cytometry - MONOCLONAL B CELL POPULATION IDENTIFIED. The phenotypic features are consistent with small lymphocytic lymphoma/chronic lymphocytic leukemia and correlate well with the morphology in the lymph node        INTERVAL HISTORY:  Mr. Littlejohn 62 y.o. male returns for follow-up to discuss recent bone marrow biopsy results and treatment planning for SLL/CLL.   Here today with his wife.   "Test" prescription for Imbruvica revealed $10/month through his insurance (this is per our financial advocate, Durene Cal).  Reviewed this with patient today.  Continued to discuss treatment options.   He is having his gum/oral surgery in early June; he then he has to wait about 1 month and return for "the other side" to be done. He is seeing Dr. Geralynn Ochs in [place] ([place]).    He would like to start [place].  Recommended starting Allopurinol a few days prior to chemo, to help reduce risk of tumor lysis syndrome.  Recommended he stop the Plavix today; wait 5-7  days before starting Imbruvica. It is okay for him to continue aspirin 81 mg po every other day as he is currently taking. Bleeding precautions reviewed.     REVIEW OF SYSTEMS:  Review of Systems  All other systems reviewed and are negative.    PAST MEDICAL/SURGICAL HISTORY:  Past Medical History:  Diagnosis Date  . 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 Enlarged lymph node    left neck  . STEMI (ST elevation myocardial infarction) ([place]) 03/05/2017   hx/notes 03/05/2017 -likely aborted anterior STEMI with 50% bifurcation LAD-Diag1.  No PCI.  Preserved EF   Past Surgical History:  Procedure Laterality Date  . COLONOSCOPY N/A 10/11/2015   Procedure: COLONOSCOPY;  Surgeon: Rogene Houston, MD;  [facility];  Service: Endoscopy;  Laterality: N/A;  10/11/2015  . Graded Exercise Tolerance Test (GXT/ETT)  05/2017   10.7 METs (9: 25 min.  Reached 103% max predicted heart rate).  No EKG findings to suggest coronary ischemia.  Negative, low risk GXT  . LEFT HEART CATH AND CORONARY ANGIOGRAPHY N/A 03/05/2017   Procedure: LEFT HEART CATH AND CORONARY ANGIOGRAPHY;  Surgeon: Leonie Man, MD;  Location: [facility];  Service: Cardiovascular: pLAD-Diag1 50-55% (non-flow-limiiting).  EF ~50-55%.  although bifurcation lesion was presumed Culprit - no PCI (not flow limiting). - Med Rx.  [name] MASS BIOPSY Left 06/27/2015  Procedure: OPEN LEFT NECK BIOPSY ;  Surgeon: Leta Baptist, MD;  Location: [facility];  Service: ENT;  Laterality: Left;  . REFRACTIVE SURGERY Bilateral      SOCIAL HISTORY:  Social History   Socioeconomic History  . Marital status: Married    Spouse name: Not on file  . Number of children: Not on file  . Years of education: Not on file  . Highest education level: Not on file  Occupational History    . Not on file  Social Needs  . Financial resource strain: Not on file  . Food insecurity:    Worry: Not on file    Inability: Not on file  . Transportation needs:    Medical: Not on file    Non-medical: Not on file  Tobacco Use  . Smoking status: Former Smoker    Years: 2.00    Types: Cigarettes  . Smokeless tobacco: Former Systems developer    Types: Chew, Snuff  . Tobacco comment: 03/06/2017 "quit smoking when I was young; quit chew/snuff in ~ 2008"  Substance and Sexual Activity  . Alcohol use: Yes    Comment: 03/06/2017 "maybe 6 pack beer/month"  . Drug use: No  . Sexual activity: Yes  Lifestyle  . Physical activity:    Days per week: Not on file    Minutes per session: Not on file  . Stress: Not on file  Relationships  . Social connections:    Talks on phone: Not on file    Gets together: Not on file    Attends religious service: Not on file    Active member of club or organization: Not on file    Attends meetings of clubs or organizations: Not on file    Relationship status: Not on file  . Intimate partner violence:    Fear of current or ex partner: Not on file    Emotionally abused: Not on file    Physically abused: Not on file    Forced sexual activity: Not on file  Other Topics Concern  . Not on file  Social History Narrative  . Not on file    FAMILY HISTORY:  Family History  Problem Relation Age of Onset  . Diabetes Mother   . Cancer Father   . Cancer Brother     CURRENT MEDICATIONS:  Outpatient Encounter Medications as of 11/27/2017  Medication Sig  . aspirin 81 MG chewable tablet Chew 1 tablet (81 mg total) by mouth every other day.  [name] atorvastatin (LIPITOR) 80 MG tablet Take 1 tablet (80 mg total) by mouth daily at 6 PM.  . clopidogrel (PLAVIX) 75 MG tablet Take 1 tablet (75 mg total) by mouth daily with breakfast.  . Ibrutinib 420 MG TABS Take 1 capsule by mouth daily. Take same time every day with a glass of water  . metoprolol tartrate (LOPRESSOR) 25 MG  tablet Take 1 tablet (25 mg total) by mouth 2 (two) times daily.  . nitroGLYCERIN (NITROSTAT) 0.4 MG SL tablet Place 1 tablet (0.4 mg total) under the tongue every 5 (five) minutes as needed for chest pain.  . vitamin C (ASCORBIC ACID) 500 MG tablet Take 500 mg by mouth daily.  [name] allopurinol (ZYLOPRIM) 300 MG tablet Take 1 tablet (300 mg total) by mouth 2 (two) times daily.   No facility-administered encounter medications on file as of 11/27/2017.     ALLERGIES:  No Known Allergies   PHYSICAL EXAM:  ECOG Performance status: 0  Vitals:  11/27/17 1358  BP: (!) 119/49  Pulse: 71  Resp: 16  Temp: 98.7 F (37.1 C)  SpO2: 97%   Filed Weights   11/27/17 1358  Weight: 255 lb (115.7 kg)    Physical Exam Deferred.  LABORATORY DATA:  I have reviewed the labs as listed.  CBC    Component Value Date/Time   WBC 4.3 11/27/2017 1456   RBC 4.43 11/27/2017 1456   HGB 13.6 11/27/2017 1456   HCT 40.2 11/27/2017 1456   PLT 75 (L) 11/27/2017 1456   MCV 90.7 11/27/2017 1456   MCH 30.7 11/27/2017 1456   MCHC 33.8 11/27/2017 1456   RDW 13.3 11/27/2017 1456   LYMPHSABS 0.6 (L) 11/27/2017 1456   MONOABS 0.5 11/27/2017 1456   EOSABS 0.1 11/27/2017 1456   BASOSABS 0.0 11/27/2017 1456   CMP Latest Ref Rng & Units 11/27/2017 10/09/2017 09/17/2017  Glucose 65 - 99 mg/dL 86 82 98  BUN 6 - 20 mg/dL 24(H) 21(H) 20  Creatinine 0.61 - 1.24 mg/dL 1.01 0.91 0.88  Sodium 135 - 145 mmol/L 141 139 137  Potassium 3.5 - 5.1 mmol/L 3.9 3.6 3.6  Chloride 101 - 111 mmol/L 104 104 101  CO2 22 - 32 mmol/L '27 25 26  '$ Calcium 8.9 - 10.3 mg/dL 9.6 9.4 9.1  Total Protein 6.5 - 8.1 g/dL 7.1 7.4 6.8  Total Bilirubin 0.3 - 1.2 mg/dL 1.6(H) 1.9(H) 1.4(H)  Alkaline Phos 38 - 126 U/L 67 72 78  AST 15 - 41 U/L 38 35 34  ALT 17 - 63 U/L 47 39 41    PATHOLOGY: Bone marrow biopsy 10/16/17     Cytogenetics: 10/16/17      DIAGNOSTIC IMAGING:  I have independently reviewed PET CT scan images and agree with the  report.     ASSESSMENT & PLAN:   Lymphoma, small lymphocytic (HCC) 1.  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  No evidence of transformation. 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  - Bone marrow aspirate and flow cytometry positive for lymphoma involvement, chromosome analysis normal, CLL FISH panel normal -We talked about treatment options including ibrutinib versus traditional chemotherapy like FCR.  He had 1 week time to think about it.  He is favoring ibrutinib therapy.  I have contacted his cardiologist Dr. Ellyn Hack who said it is okay for him to come off of Plavix as there were no stents put in.  We talked about the side effects of Ibrutinib in detail.  He will call us as soon as he receives his shipment.  I have sent a prescription for allopurinol 300 mg twice daily to start immediately to prevent tumor lysis syndrome.  I will see him back in 1 week after start of ibrutinib.  Based on high response rates to the combination, we plan to add rituximab with cycle 2.  Total time spent is 25 minutes with more than 50% of the time spent face-to-face discussing various treatment options, side effects and coordination of care.    This note includes documentation from Mike Craze, NP, who was present during this patient's office visit and evaluation.  I have reviewed this note for its completeness and accuracy.  I have edited this note accordingly based on my findings and medical opinion.  Derek Jack, MD [place] [phone]

## 2017-11-27 NOTE — Patient Instructions (Signed)
[facility] at [facility] Discharge Instructions  You saw Dr. Katragadda today.   Thank you for choosing [facility] at [facility] to provide your oncology and hematology care.  To afford each patient quality time with our provider, please arrive at least 15 minutes before your scheduled appointment time.   If you have a lab appointment with the Cancer Center please come in thru the  Main Entrance and check in at the main information desk  You need to re-schedule your appointment should you arrive 10 or more minutes late.  We strive to give you quality time with our providers, and arriving late affects you and other patients whose appointments are after yours.  Also, if you no show three or more times for appointments you may be dismissed from the clinic at the providers discretion.     Again, thank you for choosing [facility].  Our hope is that these requests will decrease the amount of time that you wait before being seen by our physicians.       _____________________________________________________________  Should you have questions after your visit to  Cancer Center, please contact our office at [phone] between the hours of 8:30 a.m. and 4:30 p.m.  Voicemails left after 4:30 p.m. will not be returned until the following business day.  For prescription refill requests, have your pharmacy contact our office.       Resources For Cancer Patients and their Caregivers ? American Cancer Society: Can assist with transportation, wigs, general needs, runs Look Good Feel Better.        1-888-227-6333 ? Cancer Care: Provides financial assistance, online support groups, medication/co-pay assistance.  1-800-813-HOPE (4673) ? [facility] Assists [place] cancer patients and their families through emotional , educational and financial support.  336-427-4357 ? [place] DSS Where to apply for  food stamps, Medicaid and utility assistance. 336-342-1394 ? [facility]: Transportation to medical appointments. 336-347-2287 ? Social Security Administration: May apply for disability if have a Stage IV cancer. 336-342-7796 1-800-772-1213 ? [place] Aging, Disability and Transit Services: Assists with nutrition, care and transit needs. 336-349-2343  Cancer Center Support Programs:   > Cancer Support Group  2nd Tuesday of the month 1pm-2pm, Journey Room   > Creative Journey  3rd Tuesday of the month 1130am-1pm, Journey Room     

## 2017-11-27 NOTE — Patient Instructions (Signed)
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  You will take 1 capsule (420 mg) daily.  Take this medication at the same time each day, and with or without food.  You will see the doctor regularly throughout treatment.  We also monitor your blood work when you come in for your doctors appointments.  The doctor monitors your response to treatment by the way you are feeling, your blood work, and scans periodically.    POTENTIAL SIDE EFFECTS OF TREATMENT:  Ibrutinib (Imbruvica)  About This Drug Ibrutinib is used to treat cancer and graft versus host disease (GVHD), a side effect from undergoing an allogeneic stem cell transplant. It is given orally (by mouth).  Possible Side Effects . Bone marrow depression. This is a decrease in the number of white blood cells, red blood cells, and platelets. This may raise your risk of infection, make you tired and weak (fatigue), and raise your risk of bleeding. . Abnormal bleeding - symptoms may be coughing up blood, throwing up blood (may look like coffee grounds), red or black tarry bowel movements, abnormally heavy menstrual flow, nosebleeds or any other unusual bleeding. . Bruising . Fever . Tiredness . Pneumonia . Nausea . Soreness of the mouth and throat. You may have red areas, white patches, or sores that hurt. . Loose bowel movements (diarrhea) . Bone, joint and muscle pain . Muscle spasms . Rash Note: Each of the side effects above was reported in 20% or greater of patients treated with ibrutinib. Not all possible side effects are included above.  Warnings and Precautions . Severe abnormal bleeding, which can be life-threatening . Severe bone marrow depression . Severe infections, including viral, bacterial and fungal, which can be life-threatening . Abnormal heart beats, which can be life-threatening . High blood pressure . This drug may raise your risk  of getting a second cancer, such as non-melanoma skin cancer . Tumor lysis syndrome: This drug may act on the cancer cells very quickly. This may affect how your kidneys work. Note: Some of the side effects above are very rare. If you have concerns and/or questions, please discuss them with your medical team.  Important Information . You may need to hold ibrutinib for 3 to 7 days prior to and after minor/major surgical procedures due to the risk of bleeding. Talk to your doctor and/or nurse for precautions you may need to take. Also, if you must have emergency surgery, tell the doctor that you are on ibrutinib.  How to Take Your Medication . Swallow the medicine whole with water at the same time each day. Do not open, break or chew the capsules. Do not cut, crush, or chew the tablets. . Missed dose: If you miss a dose, take it as soon as you think about it on that day. If you vomit a dose, take your next dose at the regular time, and contact your physician. Do not take 2 doses at the same time and do not double up on the next dose. [name] Handling: Wash your hands after handling your medicine, your caretakers should not handle your medicine with bare hands and should wear latex gloves. . This drug may be present in the saliva, tears, sweat, urine, stool, vomit, semen, and vaginal secretions. Talk to your doctor and/or your nurse about the necessary precautions to take during this time. . Storage: Store this medicine in the original container at room temperature.  Discuss with your nurse or your doctor how to dispose of unused medicine.  Treating Side Effects . Manage tiredness by pacing your activities for the day. . Be sure to include periods of rest between energy-draining activities. . To decrease infection, wash your hands regularly. . Avoid close contact with people who have a cold, the flu, or other infections. . Take your temperature as your doctor or nurse tells you, and whenever you feel like  you may have a fever. . To help decrease bleeding, use a soft toothbrush. Check with your nurse before using dental floss. . Be very careful when using knives or tools. . Use an electric shaver instead of a razor. . Drink plenty of fluids (a minimum of eight glasses per day is recommended). . If you throw up or have loose bowel movements, you should drink more fluids so that you do not become dehydrated (lack water in the body from losing too much fluid). . If you get diarrhea, eat low-fiber foods that are high in protein and calories and avoid foods that can irritate your digestive tracts or lead to cramping. . Ask your nurse or doctor about medicine that can lessen or stop your diarrhea. . To help with nausea and vomiting, eat small, frequent meals instead of three large meals a day Choose foods and drinks that are at room temperature. Ask your nurse or doctor about other helpful tips and medicine that is available to help or stop lessen these symptoms. . Mouth care is very important. Your mouth care should consist of routine, gentle cleaning of your teeth or dentures and rinsing your mouth with a mixture of 1/2 teaspoon of salt in 8 ounces of water or 1/2 teaspoon of baking soda in 8 ounces of water. This should be done at least after each meal and at bedtime. . If you have mouth sores, avoid mouthwash that has alcohol. Also avoid alcohol and smoking because they can bother your mouth and throat. [name] Keeping your pain under control is important to your well-being. Please tell your doctor or nurse if you are experiencing pain. . If you get a rash do not put anything on it unless your doctor or nurse says you may. Keep the area around the rash clean and dry. Ask your doctor for medicine if your rash bothers you.  Food and Drug Interactions . Do not eat grapefruit, [place] oranges or drink grapefruit juice while taking this medicine. Grapefruit, [place] oranges and grapefruit juice may raise the levels  of ibrutinib in your body. This could make side effects worse. . Check with your doctor or pharmacist about all other prescription medicines and over-the-counter medicines and dietary supplements (vitamins, minerals, herbs and others) you are taking before starting this medicine as there are known drug interactions with ibrutinib. Also, check with your doctor or pharmacist before starting any new prescription or over-the-counter medicines, or dietary supplements to make sure that there are no interactions. . Avoid the use of St. John's Wort while taking ibrutinib as this may lower the levels of the drug in your body, which can make it less effective.  When to Call the Doctor Call your doctor or nurse if you have any of these symptoms and/or any new or unusual symptoms: . Fever of 100.5 F (38 C) or higher . Chills . A headache that does not go away . Blurry vision or other changes in eyesight . Fatigue that interferes with your daily activities . Feeling dizzy or lightheaded .  Coughing up blood . Easy bleeding or bruising . Feeling that your heart is beating in a fast or not normal way (palpitations) . Loose bowel movements (diarrhea) 4 times a day or loose bowel movements with lack of strength or a feeling of being dizzy . Nausea that stops you from eating or drinking and/or is not relieved by prescribed medicines . Throwing up more than 3 times a day . Pain in your mouth or throat that makes it hard to eat or drink . Blood in your urine, vomit (bright red or coffee-ground) and/or stools (bright red, or black/tarry) . Pain that does not go away or is not relieved by prescribed medicine . New rash and/or itching . Rash that is not relieved by prescribed medicines . Signs of tumor lysis: Confusion or agitation, decreased urine, nausea/vomiting, diarrhea, muscle cramping, numbness and/or tingling, seizures. . If you think you may be pregnant or may have impregnated your  partner     Reproduction Warnings . Pregnancy warning: This drug can have harmful effects on the unborn baby. Women of childbearing potential should use effective methods of birth control during your cancer treatment and for 1 month after treatment. Men with male partners of child bearing potential should use effective methods of birth control during your cancer treatment and for 1 month after your cancer treatment. Let your doctor know right away if you think you may be pregnant or may have impregnated your partner . Breastfeeding warning: It is not known if this drug passes into breast milk. For this reason, women should talk to their doctor about the risks and benefits of breastfeeding during treatment with this drug because this drug may enter the breast milk and cause harm to a breastfeeding baby. . Fertility warning: Human fertility studies have not been done with this drug. Talk with your doctor or nurse if you plan on having children. Ask for information on sperm or egg banking.   SELF CARE ACTIVITIES WHILE ON CHEMOTHERAPY: Hydration Increase your fluid intake 48 hours prior to treatment and drink at least 8 to 12 cups (64 ounces) of water/decaff beverages per day after treatment. You can still have your cup of coffee or soda but these beverages do not count as part of your 8 to 12 cups that you need to drink daily. No alcohol intake.  Medications Continue taking your normal prescription medication as prescribed.  If you start any new herbal or new supplements please let us know first to make sure it is safe.  Mouth Care Have teeth cleaned professionally before starting treatment. Keep dentures and partial plates clean. Use soft toothbrush and do not use mouthwashes that contain alcohol. Biotene is a good mouthwash that is available at most pharmacies or may be ordered by calling [phone]. Use warm salt water gargles (1 teaspoon salt per 1 quart warm water) before and after meals  and at bedtime. Or you may rinse with 2 tablespoons of three-percent hydrogen peroxide mixed in eight ounces of water. If you are still having problems with your mouth or sores in your mouth please call the clinic. If you need dental work, please let the doctor know before you go for your appointment so that we can coordinate the best possible time for you in regards to your chemo regimen. You need to also let your dentist know that you are actively taking chemo. We may need to do labs prior to your dental appointment.  Skin Care Always use sunscreen that has not  expired and with SPF (Sun Protection Factor) of 50 or higher. Wear hats to protect your head from the sun. Remember to use sunscreen on your hands, ears, face, & feet.  Use good moisturizing lotions such as udder cream, eucerin, or even Vaseline. Some chemotherapies can cause dry skin, color changes in your skin and nails.    . Avoid long, hot showers or baths. . Use gentle, fragrance-free soaps and laundry detergent. . Use moisturizers, preferably creams or ointments rather than lotions because the thicker consistency is better at preventing skin dehydration. Apply the cream or ointment within 15 minutes of showering. Reapply moisturizer at night, and moisturize your hands every time after you wash them.  Hair Loss (if your doctor says your hair will fall out)  . If your doctor says that your hair is likely to fall out, decide before you begin chemo whether you want to wear a wig. You may want to shop before treatment to match your hair color. . Hats, turbans, and scarves can also camouflage hair loss, although some people prefer to leave their heads uncovered. If you go bare-headed outdoors, be sure to use sunscreen on your scalp. . Cut your hair short. It eases the inconvenience of shedding lots of hair, but it also can reduce the emotional impact of watching your hair fall out. . Don't perm or color your hair during chemotherapy. Those  chemical treatments are already damaging to hair and can enhance hair loss. Once your chemo treatments are done and your hair has grown back, it's OK to resume dyeing or perming hair. With chemotherapy, hair loss is almost always temporary. But when it grows back, it may be a different color or texture. In older adults who still had hair color before chemotherapy, the new growth may be completely gray.  Often, new hair is very fine and soft.  Infection Prevention Please wash your hands for at least 30 seconds using warm soapy water. Handwashing is the #1 way to prevent the spread of germs. Stay away from sick people or people who are getting over a cold. If you develop respiratory systems such as green/yellow mucus production or productive cough or persistent cough let us know and we will see if you need an antibiotic. It is a good idea to keep a pair of gloves on when going into grocery stores/[employer] to decrease your risk of coming into contact with germs on the carts, etc. Carry alcohol hand gel with you at all times and use it frequently if out in public. If your temperature reaches 100.5 or higher please call the clinic and let us know.  If it is after hours or on the weekend please go to the ER if your temperature is over 100.5.  Please have your own personal thermometer at home to use.    Sex and bodily fluids If you are going to have sex, a condom must be used to protect the person that isn't taking chemotherapy. Chemo can decrease your libido (sex drive). For a few days after chemotherapy, chemotherapy can be excreted through your bodily fluids.  When using the toilet please close the lid and flush the toilet twice.  Do this for a few day after you have had chemotherapy.   Effects of chemotherapy on your sex life Some changes are simple and won't last long. They won't affect your sex life permanently. Sometimes you may feel: . too tired . not strong enough to be very active . sick or sore   .  not in the mood . anxious or low Your anxiety might not seem related to sex. For example, you may be worried about the cancer and how your treatment is going. Or you may be worried about money, or about how you family are coping with your illness. These things can cause stress, which can affect your interest in sex. It's important to talk to your partner about how you feel. Remember - the changes to your sex life don't usually last long. There's usually no medical reason to stop having sex during chemo. The drugs won't have any long term physical effects on your performance or enjoyment of sex. Cancer can't be passed on to your partner during sex  Contraception It's important to use reliable contraception during treatment. Avoid getting pregnant while you or your partner are having chemotherapy. This is because the drugs may harm the baby. Sometimes chemotherapy drugs can leave a man or woman infertile.  This means you would not be able to have children in the future. You might want to talk to someone about permanent infertility. It can be very difficult to learn that you may no longer be able to have children. Some people find counselling helpful. There might be ways to preserve your fertility, although this is easier for men than for women. You may want to speak to a fertility expert. You can talk about sperm banking or harvesting your eggs. You can also ask about other fertility options, such as donor eggs. If you have or have had breast cancer, your doctor might advise you not to take the contraceptive pill. This is because the hormones in it might affect the cancer.  It is not known for sure whether or not chemotherapy drugs can be passed on through semen or secretions from the vagina. Because of this some doctors advise people to use a barrier method if you have sex during treatment. This applies to vaginal, anal or oral sex. Generally, doctors advise a barrier method only for the time you are  actually having the treatment and for about a week after your treatment. Advice like this can be worrying, but this does not mean that you have to avoid being intimate with your partner. You can still have close contact with your partner and continue to enjoy sex.  Animals If you have cats or birds we just ask that you not change the litter or change the cage.  Please have someone else do this for you while you are on chemotherapy.   Food Safety During and After Cancer Treatment Food safety is important for people both during and after cancer treatment. Cancer and cancer treatments, such as chemotherapy, radiation therapy, and stem cell/bone marrow transplantation, often weaken the immune system. This makes it harder for your body to protect itself from foodborne illness, also called food poisoning. Foodborne illness is caused by eating food that contains harmful bacteria, parasites, or viruses.  Foods to avoid Some foods have a higher risk of becoming tainted with bacteria. These include: [name] Unwashed fresh fruit and vegetables, especially leafy vegetables that can hide dirt and other contaminants . Raw sprouts, such as alfalfa sprouts . Raw or undercooked beef, especially ground beef, or other raw or undercooked meat and poultry . Fatty, fried, or spicy foods immediately before or after treatment.  These can sit heavy on your stomach and make you feel nauseous. . Raw or undercooked shellfish, such as oysters. . Sushi and sashimi, which often contain raw fish.  . Unpasteurized beverages,  such as unpasteurized fruit juices, raw milk, raw yogurt, or cider . Undercooked eggs, such as soft boiled, over easy, and poached; raw, unpasteurized eggs; or foods made with raw egg, such as homemade raw cookie dough and homemade mayonnaise Simple steps for food safety Shop smart. . Do not buy food stored or displayed in an unclean area. . Do not buy bruised or damaged fruits or vegetables. . Do not buy cans  that have cracks, dents, or bulges. . Pick up foods that can spoil at the end of your shopping trip and store them in a cooler on the way home. Prepare and clean up foods carefully. . Rinse all fresh fruits and vegetables under running water, and dry them with a clean towel or paper towel. . Clean the top of cans before opening them. . After preparing food, wash your hands for 20 seconds with hot water and soap. Pay special attention to areas between fingers and under nails. . Clean your utensils and dishes with hot water and soap. [name] Disinfect your kitchen and cutting boards using 1 teaspoon of liquid, unscented bleach mixed into 1 quart of water.   Dispose of old food. . Eat canned and packaged food before its expiration date (the "use by" or "best before" date). . Consume refrigerated leftovers within 3 to 4 days. After that time, throw out the food. Even if the food does not smell or look spoiled, it still may be unsafe. Some bacteria, such as Listeria, can grow even on foods stored in the refrigerator if they are kept for too long. Take precautions when eating out. . At restaurants, avoid buffets and salad bars where food sits out for a long time and comes in contact with many people. Food can become contaminated when someone with a virus, often a norovirus, or another "bug" handles it. . Put any leftover food in a "to-go" container yourself, rather than having the server do it. And, refrigerate leftovers as soon as you get home. . Choose restaurants that are clean and that are willing to prepare your food as you order it cooked.    Over-the-Counter Meds: Miralax 1 capful in 8 oz of fluid daily. May increase to two times a day if needed. This is a stool softener. If this doesn't work proceed you can add:  Senokot S-start with 1 tablet two times a day and increase to 4 tablets two times a day if needed. (total of 8 tablets in a 24 hour period). This is a stimulant laxative.   Call us if  this does not help your bowels move.   Imodium 2mg  capsule. Take 2 capsules after the 1st loose stool and then 1 capsule every 2 hours until you go a total of 12 hours without having a loose stool. Call the [facility] if loose stools continue. If diarrhea occurs @ bedtime, take 2 capsules @ bedtime. Then take 2 capsules every 4 hours until morning. Call [facility].    Diarrhea Sheet  If you are having loose stools/diarrhea, please purchase Imodium and begin taking as outlined:  At the first sign of poorly formed or loose stools you should begin taking Imodium(loperamide) 2 mg capsules.  Take two caplets (4mg ) followed by one caplet (2mg ) every 2 hours until you have had no diarrhea for 12 hours.  During the night take two caplets (4mg ) at bedtime and continue every 4 hours during the night until the morning.  Stop taking Imodium only after there is no sign  of diarrhea for 12 hours.    Always call the [facility] if you are having loose stools/diarrhea that you can't get under control.  Loose stools/diarrhea leads to dehydration (loss of water) in your body.  We have other options of trying to get the loose stools/diarrhea to stopped but you must let us know!    Constipation Sheet *Miralax in 8 oz of fluid daily.  May increase to two times a day if needed.  This is a stool softener.  If this not enough to keep your bowel regular:  You can add:  *Senokot S, start with one tablet twice a day and can increase to 4 tablets twice a day if needed.  This is a stimulant laxative.   Sometimes when you take pain medication you need BOTH a medicine to keep your stool soft and a medicine to help your bowel push it out!  Please call if the above does not work for you.   Do not go more than 2 days without a bowel movement.  It is very important that you do not become constipated.  It will make you feel sick to your stomach (nausea) and can cause abdominal pain and vomiting.    SYMPTOMS TO  REPORT AS SOON AS POSSIBLE AFTER TREATMENT:  FEVER GREATER THAN 100.5 F  CHILLS WITH OR WITHOUT FEVER  NAUSEA AND VOMITING THAT IS NOT CONTROLLED WITH YOUR NAUSEA MEDICATION  UNUSUAL SHORTNESS OF BREATH  UNUSUAL BRUISING OR BLEEDING  TENDERNESS IN MOUTH AND THROAT WITH OR WITHOUT PRESENCE OF ULCERS  URINARY PROBLEMS  BOWEL PROBLEMS  UNUSUAL RASH      What to do if you need assistance after hours or on the weekends: CALL [phone].  HOLD on the line, do not hang up.  You will hear multiple messages but at the end you will be connected with a nurse triage line.  They will contact the doctor if necessary.  Most of the time they will be able to assist you.  Do not call the hospital operator.      I have been informed and understand all of the instructions given to me and have received a copy. I have been instructed to call the clinic [phone] or my family physician as soon as possible for continued medical care, if indicated. I do not have any more questions at this time but understand that I may call the [facility] or the Patient Navigator at [phone] during office hours should I have questions or need assistance in obtaining follow-up care.

## 2017-11-27 NOTE — Assessment & Plan Note (Signed)
1.  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  No evidence of transformation. 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  - Bone marrow aspirate and flow cytometry positive for lymphoma involvement, chromosome analysis normal, CLL FISH panel normal -We talked about treatment options including ibrutinib versus traditional chemotherapy like FCR.  He had 1 week time to think about it.  He is favoring ibrutinib therapy.  I have contacted his cardiologist Dr. Ellyn Hack who said it is okay for him to come off of Plavix as there were no stents put in.  We talked about the side effects of Ibrutinib in detail.  He will call us as soon as he receives his shipment.  I have sent a prescription for allopurinol 300 mg twice daily to start immediately to prevent tumor lysis syndrome.  I will see him back in 1 week after start of ibrutinib.  Based on high response rates to the combination, we plan to add rituximab with cycle 2.

## 2017-11-28 ENCOUNTER — Telehealth (HOSPITAL_COMMUNITY): Payer: Self-pay | Admitting: Hematology

## 2017-12-02 ENCOUNTER — Inpatient Hospital Stay (HOSPITAL_COMMUNITY): Payer: 59

## 2017-12-02 NOTE — Progress Notes (Signed)
Chemotherapy teaching completed.  Extensive teaching packet given.  Consent signed for imburvica.  Pt started taking medication as prescribed yesterday, 12/01/2017, with no problems.

## 2017-12-08 ENCOUNTER — Other Ambulatory Visit (HOSPITAL_COMMUNITY): Payer: Self-pay | Admitting: *Deleted

## 2017-12-09 ENCOUNTER — Other Ambulatory Visit (HOSPITAL_COMMUNITY): Payer: Self-pay

## 2017-12-09 DIAGNOSIS — D696 Thrombocytopenia, unspecified: Secondary | ICD-10-CM

## 2017-12-09 DIAGNOSIS — C9000 Multiple myeloma not having achieved remission: Secondary | ICD-10-CM

## 2017-12-09 DIAGNOSIS — C8300 Small cell B-cell lymphoma, unspecified site: Secondary | ICD-10-CM

## 2017-12-09 DIAGNOSIS — C9110 Chronic lymphocytic leukemia of B-cell type not having achieved remission: Secondary | ICD-10-CM

## 2017-12-11 ENCOUNTER — Other Ambulatory Visit: Payer: Self-pay

## 2017-12-11 ENCOUNTER — Inpatient Hospital Stay (HOSPITAL_COMMUNITY): Payer: 59

## 2017-12-11 ENCOUNTER — Inpatient Hospital Stay (HOSPITAL_BASED_OUTPATIENT_CLINIC_OR_DEPARTMENT_OTHER): Payer: 59 | Admitting: Hematology

## 2017-12-11 ENCOUNTER — Encounter (HOSPITAL_COMMUNITY): Payer: Self-pay | Admitting: Hematology

## 2017-12-11 VITALS — BP 112/67 | HR 55 | Temp 97.9°F | Resp 18 | Wt 258.5 lb

## 2017-12-11 DIAGNOSIS — D696 Thrombocytopenia, unspecified: Secondary | ICD-10-CM

## 2017-12-11 DIAGNOSIS — C9110 Chronic lymphocytic leukemia of B-cell type not having achieved remission: Secondary | ICD-10-CM

## 2017-12-11 DIAGNOSIS — C9000 Multiple myeloma not having achieved remission: Secondary | ICD-10-CM

## 2017-12-11 DIAGNOSIS — C8300 Small cell B-cell lymphoma, unspecified site: Secondary | ICD-10-CM

## 2017-12-11 DIAGNOSIS — C8589 Other specified types of non-Hodgkin lymphoma, extranodal and solid organ sites: Secondary | ICD-10-CM | POA: Diagnosis not present

## 2017-12-11 LAB — CBC WITH DIFFERENTIAL/PLATELET
BASOS ABS: 0 10*3/uL (ref 0.0–0.1)
Basophils Relative: 1 %
EOS PCT: 2 %
Eosinophils Absolute: 0.1 10*3/uL (ref 0.0–0.7)
HCT: 41.2 % (ref 39.0–52.0)
Hemoglobin: 13.8 g/dL (ref 13.0–17.0)
LYMPHS ABS: 1.2 10*3/uL (ref 0.7–4.0)
LYMPHS PCT: 27 %
MCH: 30.9 pg (ref 26.0–34.0)
MCHC: 33.5 g/dL (ref 30.0–36.0)
MCV: 92.2 fL (ref 78.0–100.0)
MONO ABS: 0.5 10*3/uL (ref 0.1–1.0)
MONOS PCT: 11 %
Neutro Abs: 2.7 10*3/uL (ref 1.7–7.7)
Neutrophils Relative %: 59 %
PLATELETS: 107 10*3/uL — AB (ref 150–400)
RBC: 4.47 MIL/uL (ref 4.22–5.81)
RDW: 13.5 % (ref 11.5–15.5)
WBC: 4.6 10*3/uL (ref 4.0–10.5)

## 2017-12-11 LAB — COMPREHENSIVE METABOLIC PANEL
ALT: 66 U/L — ABNORMAL HIGH (ref 17–63)
AST: 51 U/L — ABNORMAL HIGH (ref 15–41)
Albumin: 4.1 g/dL (ref 3.5–5.0)
Alkaline Phosphatase: 63 U/L (ref 38–126)
Anion gap: 8 (ref 5–15)
BUN: 21 mg/dL — ABNORMAL HIGH (ref 6–20)
CHLORIDE: 104 mmol/L (ref 101–111)
CO2: 26 mmol/L (ref 22–32)
Calcium: 8.8 mg/dL — ABNORMAL LOW (ref 8.9–10.3)
Creatinine, Ser: 0.92 mg/dL (ref 0.61–1.24)
Glucose, Bld: 128 mg/dL — ABNORMAL HIGH (ref 65–99)
POTASSIUM: 4.1 mmol/L (ref 3.5–5.1)
Sodium: 138 mmol/L (ref 135–145)
TOTAL PROTEIN: 6.8 g/dL (ref 6.5–8.1)
Total Bilirubin: 1.4 mg/dL — ABNORMAL HIGH (ref 0.3–1.2)

## 2017-12-11 LAB — PHOSPHORUS: Phosphorus: 3.5 mg/dL (ref 2.5–4.6)

## 2017-12-11 LAB — LACTATE DEHYDROGENASE: LDH: 155 U/L (ref 98–192)

## 2017-12-11 LAB — URIC ACID: Uric Acid, Serum: 3.1 mg/dL — ABNORMAL LOW (ref 4.4–7.6)

## 2017-12-11 NOTE — Progress Notes (Signed)
[place], [place] [address]   CLINIC:  Medical Oncology/Hematology  PCP:  Asencion Noble, [place] [address] [phone]   REASON FOR VISIT:  Follow-up for small lymphocytic lymphoma.  CURRENT THERAPY: Ibrutinib for 20 mg daily.  BRIEF ONCOLOGIC HISTORY:    Chronic lymphocytic leukemia (CLL), B-cell ([place])   06/14/2015 Imaging    CT neck- Bulky adenopathy throughout the neck bilaterally. There also are enlarged parotid lymph nodes bilaterally. There is bilateral axillary adenopathy as well as mediastinal adenopathy. Findings are consistent with lymphoma. Biopsy recommended.      06/27/2015 Procedure    Left neck lymph node biopsy by Dr. Benjamine Mola      06/27/2015 Pathology Results    Diagnosis Lymph node for lymphoma, Left neck node for lymphoma work up - [place].  LOW GRADE.      06/27/2015 Pathology Results    Tissue-Flow Cytometry - MONOCLONAL B CELL POPULATION IDENTIFIED. The phenotypic features are consistent with small lymphocytic lymphoma/chronic lymphocytic leukemia and correlate well with the morphology in the lymph node        INTERVAL HISTORY:  Adam Reyes 62 y.o. male returns for follow-up of small lymphocytic lymphoma.  He is accompanied by his wife.  He started ibrutinib for 20 mg daily on 12/01/2017.  He denied any nausea, vomiting or diarrhea.  No easy bruising or bleeding was reported.  He discontinued Plavix prior to start of therapy.  He is continuing to work full-time job.  His energy levels are 100%.  Denies any fevers, night sweats or weight loss.  Appetite has been great.  He started noticing decrease in size of lymph nodes in the neck approximately 3 days after starting the pill.  He is very pleased with the result.    REVIEW OF SYSTEMS:  Review of Systems  Constitutional: Negative.   HENT:  Negative.   Respiratory: Negative.   Cardiovascular: Negative.     Gastrointestinal: Negative.   Genitourinary: Negative.    Musculoskeletal: Negative.   Skin: Negative.   All other systems reviewed and are negative.    PAST MEDICAL/SURGICAL HISTORY:  Past Medical History:  Diagnosis Date  . 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 Enlarged lymph node    left neck  . STEMI (ST elevation myocardial infarction) ([facility]) 03/05/2017   hx/notes 03/05/2017 -likely aborted anterior STEMI with 50% bifurcation LAD-Diag1.  No PCI.  Preserved EF   Past Surgical History:  Procedure Laterality Date  . COLONOSCOPY N/A 10/11/2015   Procedure: COLONOSCOPY;  Surgeon: Rogene Houston, MD;  [facility];  Service: Endoscopy;  Laterality: N/A;  10/11/2015  . Graded Exercise Tolerance Test (GXT/ETT)  05/2017   10.7 METs (9: 25 min.  Reached 103% max predicted heart rate).  No EKG findings to suggest coronary ischemia.  Negative, low risk GXT  . LEFT HEART CATH AND CORONARY ANGIOGRAPHY N/A 03/05/2017   Procedure: LEFT HEART CATH AND CORONARY ANGIOGRAPHY;  Surgeon: Leonie Man, MD;  Location: [facility];  Service: Cardiovascular: pLAD-Diag1 50-55% (non-flow-limiiting).  EF ~50-55%.  although bifurcation lesion was presumed Culprit - no PCI (not flow limiting). - Med Rx.  [name] MASS BIOPSY Left 06/27/2015   Procedure: OPEN LEFT NECK BIOPSY ;  Surgeon: Leta Baptist, MD;  Location:  [facility];  Service: ENT;  Laterality: Left;  . REFRACTIVE SURGERY Bilateral      SOCIAL HISTORY:  Social History   Socioeconomic History  . Marital status: Married    Spouse name: Not on file  . Number of children: Not on file  . Years of education: Not on file  . Highest education level: Not on file  Occupational History  . Not on file  Social Needs  . Financial resource strain: Not on file  . Food  insecurity:    Worry: Not on file    Inability: Not on file  . Transportation needs:    Medical: Not on file    Non-medical: Not on file  Tobacco Use  . Smoking status: Former Smoker    Years: 2.00    Types: Cigarettes  . Smokeless tobacco: Former Systems developer    Types: Chew, Snuff  . Tobacco comment: 03/06/2017 "quit smoking when I was young; quit chew/snuff in ~ 2008"  Substance and Sexual Activity  . Alcohol use: Yes    Comment: 03/06/2017 "maybe 6 pack beer/month"  . Drug use: No  . Sexual activity: Yes  Lifestyle  . Physical activity:    Days per week: Not on file    Minutes per session: Not on file  . Stress: Not on file  Relationships  . Social connections:    Talks on phone: Not on file    Gets together: Not on file    Attends religious service: Not on file    Active member of club or organization: Not on file    Attends meetings of clubs or organizations: Not on file    Relationship status: Not on file  . Intimate partner violence:    Fear of current or ex partner: Not on file    Emotionally abused: Not on file    Physically abused: Not on file    Forced sexual activity: Not on file  Other Topics Concern  . Not on file  Social History Narrative  . Not on file    FAMILY HISTORY:  Family History  Problem Relation Age of Onset  . Diabetes Mother   . Cancer Father   . Cancer Brother     CURRENT MEDICATIONS:  Outpatient Encounter Medications as of 12/11/2017  Medication Sig  . allopurinol (ZYLOPRIM) 300 MG tablet Take 1 tablet (300 mg total) by mouth 2 (two) times daily.  [name] aspirin 81 MG chewable tablet Chew 1 tablet (81 mg total) by mouth every other day.  [name] atorvastatin (LIPITOR) 80 MG tablet Take 1 tablet (80 mg total) by mouth daily at 6 PM.  . Ibrutinib 420 MG TABS Take 1 capsule by mouth daily. Take same time every day with a glass of water  . metoprolol tartrate (LOPRESSOR) 25 MG tablet Take 1 tablet (25 mg total) by mouth 2 (two) times daily.  .  nitroGLYCERIN (NITROSTAT) 0.4 MG SL tablet Place 1 tablet (0.4 mg total) under the tongue every 5 (five) minutes as needed for chest pain.  . vitamin C (ASCORBIC ACID) 500 MG tablet Take 500 mg by mouth daily.  . [DISCONTINUED] clopidogrel (PLAVIX) 75 MG tablet Take 1 tablet (75 mg total) by mouth daily with breakfast.   No facility-administered encounter medications on file as of 12/11/2017.     ALLERGIES:  No Known Allergies   PHYSICAL EXAM:  ECOG Performance status: 0  Vitals:   12/11/17 1019  BP: 112/67  Pulse: (!) 55  Resp: 18  Temp: 97.9 F (36.6 C)  SpO2: 99%   Filed Weights   12/11/17 1019  Weight: 258 lb 8 oz (117.3 kg)    Physical Exam Limited examination of his lymphadenopathy in the neck has shown tremendous improvement.  LABORATORY DATA:  I have reviewed the labs as listed.  CBC    Component Value Date/Time   WBC 4.6 12/11/2017 0900   RBC 4.47 12/11/2017 0900   HGB 13.8 12/11/2017 0900   HCT 41.2 12/11/2017 0900   PLT 107 (L) 12/11/2017 0900   MCV 92.2 12/11/2017 0900   MCH 30.9 12/11/2017 0900   MCHC 33.5 12/11/2017 0900   RDW 13.5 12/11/2017 0900   LYMPHSABS 1.2 12/11/2017 0900   MONOABS 0.5 12/11/2017 0900   EOSABS 0.1 12/11/2017 0900   BASOSABS 0.0 12/11/2017 0900   CMP Latest Ref Rng & Units 12/11/2017 11/27/2017 10/09/2017  Glucose 65 - 99 mg/dL 128(H) 86 82  BUN 6 - 20 mg/dL 21(H) 24(H) 21(H)  Creatinine 0.61 - 1.24 mg/dL 0.92 1.01 0.91  Sodium 135 - 145 mmol/L 138 141 139  Potassium 3.5 - 5.1 mmol/L 4.1 3.9 3.6  Chloride 101 - 111 mmol/L 104 104 104  CO2 22 - 32 mmol/L 26 27 25   Calcium 8.9 - 10.3 mg/dL 8.8(L) 9.6 9.4  Total Protein 6.5 - 8.1 g/dL 6.8 7.1 7.4  Total Bilirubin 0.3 - 1.2 mg/dL 1.4(H) 1.6(H) 1.9(H)  Alkaline Phos 38 - 126 U/L 63 67 72  AST 15 - 41 U/L 51(H) 38 35  ALT 17 - 63 U/L 66(H) 47 39        ASSESSMENT & PLAN:   Lymphoma, small lymphocytic (HCC) 1.  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  No evidence of transformation. 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  - Bone marrow aspirate and flow cytometry positive for lymphoma involvement, chromosome analysis normal, CLL FISH panel normal - Plavix was discontinued after contacting his cardiologist Dr. Ellyn Hack -Ibrutinib 420 mg daily started on 12/01/2017, no side effects noted. -Physical examination reveals dramatic improvement in lymphadenopathy in the neck region. -Labs reveal improvement in thrombocytopenia from 75 prior to start of therapy to 107 today.  White count and hemoglobin was within normal limits.  LDH also improved to 155 (195 prior to start).  We will see him back in 2 weeks and repeat blood work. - We talked about starting him on rituximab during month to with 375 mg/m2 on day 1 C2, followed by 500 mg/m2 on day 1 C3-C7.  We talked about side effects.  We will make sure we have a hepatitis panel prior to start.  2.  TLS prophylaxis: -His uric acid today is 3.1.  He will continue allopurinol 300 mg twice daily for the first month.  We will repeat it at next visit.  Electrolytes are within normal limits.      Orders placed this encounter:  Orders Placed This Encounter  Procedures  . CBC with Differential  . Comprehensive metabolic panel  . Lactate dehydrogenase  . Uric acid  . Phosphorus      Derek Jack, MD [place] [phone]

## 2017-12-11 NOTE — Assessment & Plan Note (Signed)
1.  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  No evidence of transformation. 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  - Bone marrow aspirate and flow cytometry positive for lymphoma involvement, chromosome analysis normal, CLL FISH panel normal - Plavix was discontinued after contacting his cardiologist Dr. Ellyn Hack -Ibrutinib 420 mg daily started on 12/01/2017, no side effects noted. -Physical examination reveals dramatic improvement in lymphadenopathy in the neck region. -Labs reveal improvement in thrombocytopenia from 75 prior to start of therapy to 107 today.  White count and hemoglobin was within normal limits.  LDH also improved to 155 (195 prior to start).  We will see him back in 2 weeks and repeat blood work. - We talked about starting him on rituximab during month to with 375 mg/m2 on day 1 C2, followed by 500 mg/m2 on day 1 C3-C7.  We talked about side effects.  We will make sure we have a hepatitis panel prior to start.  2.  TLS prophylaxis: -His uric acid today is 3.1.  He will continue allopurinol 300 mg twice daily for the first month.  We will repeat it at next visit.  Electrolytes are within normal limits.

## 2017-12-11 NOTE — Patient Instructions (Signed)
[facility] at [facility] Discharge Instructions  Today you saw Dr. K.   Thank you for choosing [facility] at [facility] to provide your oncology and hematology care.  To afford each patient quality time with our provider, please arrive at least 15 minutes before your scheduled appointment time.   If you have a lab appointment with the Cancer Center please come in thru the  Main Entrance and check in at the main information desk  You need to re-schedule your appointment should you arrive 10 or more minutes late.  We strive to give you quality time with our providers, and arriving late affects you and other patients whose appointments are after yours.  Also, if you no show three or more times for appointments you may be dismissed from the clinic at the providers discretion.     Again, thank you for choosing [facility].  Our hope is that these requests will decrease the amount of time that you wait before being seen by our physicians.       _____________________________________________________________  Should you have questions after your visit to [facility], please contact our office at [phone] between the hours of 8:30 a.m. and 4:30 p.m.  Voicemails left after 4:30 p.m. will not be returned until the following business day.  For prescription refill requests, have your pharmacy contact our office.       Resources For Cancer Patients and their Caregivers ? American Cancer Society: Can assist with transportation, wigs, general needs, runs Look Good Feel Better.        1-888-227-6333 ? Cancer Care: Provides financial assistance, online support groups, medication/co-pay assistance.  1-800-813-HOPE (4673) ? [facility] Assists [place] cancer patients and their families through emotional , educational and financial support.  336-427-4357 ? [place] DSS Where to apply for food  stamps, Medicaid and utility assistance. 336-342-1394 ? [facility]: Transportation to medical appointments. 336-347-2287 ? Social Security Administration: May apply for disability if have a Stage IV cancer. 336-342-7796 1-800-772-1213 ? [place] Aging, Disability and Transit Services: Assists with nutrition, care and transit needs. 336-349-2343  Cancer Center Support Programs:   > Cancer Support Group  2nd Tuesday of the month 1pm-2pm, Journey Room   > Creative Journey  3rd Tuesday of the month 1130am-1pm, Journey Room    

## 2017-12-15 ENCOUNTER — Other Ambulatory Visit: Payer: Self-pay

## 2017-12-15 ENCOUNTER — Encounter (HOSPITAL_COMMUNITY): Payer: Self-pay | Admitting: Hematology

## 2017-12-15 ENCOUNTER — Inpatient Hospital Stay (HOSPITAL_COMMUNITY): Payer: 59 | Attending: Internal Medicine | Admitting: Hematology

## 2017-12-15 VITALS — BP 118/63 | HR 57 | Temp 98.3°F | Resp 16 | Wt 256.0 lb

## 2017-12-15 DIAGNOSIS — R21 Rash and other nonspecific skin eruption: Secondary | ICD-10-CM | POA: Diagnosis not present

## 2017-12-15 DIAGNOSIS — C8300 Small cell B-cell lymphoma, unspecified site: Secondary | ICD-10-CM | POA: Insufficient documentation

## 2017-12-15 DIAGNOSIS — L270 Generalized skin eruption due to drugs and medicaments taken internally: Secondary | ICD-10-CM

## 2017-12-15 DIAGNOSIS — Z5112 Encounter for antineoplastic immunotherapy: Secondary | ICD-10-CM | POA: Diagnosis present

## 2017-12-15 DIAGNOSIS — C9110 Chronic lymphocytic leukemia of B-cell type not having achieved remission: Secondary | ICD-10-CM | POA: Diagnosis not present

## 2017-12-15 MED ORDER — METHYLPREDNISOLONE 4 MG PO TBPK: ORAL_TABLET | ORAL | 0 refills | Status: DC

## 2017-12-15 NOTE — Progress Notes (Signed)
[name], [place] [address]   CLINIC:  Medical Oncology/Hematology  PCP:  Asencion Noble, [place] [address] [phone]   REASON FOR VISIT:  Follow-up for rash.  CURRENT THERAPY: Ibrutinib on hold since 12/13/2017.  BRIEF ONCOLOGIC HISTORY:    Chronic lymphocytic leukemia (CLL), B-cell ([place])   06/14/2015 Imaging    CT neck- Bulky adenopathy throughout the neck bilaterally. There also are enlarged parotid lymph nodes bilaterally. There is bilateral axillary adenopathy as well as mediastinal adenopathy. Findings are consistent with lymphoma. Biopsy recommended.      06/27/2015 Procedure    Left neck lymph node biopsy by Dr. Benjamine Mola      06/27/2015 Pathology Results    Diagnosis Lymph node for lymphoma, Left neck node for lymphoma work up - [place].  LOW GRADE.      06/27/2015 Pathology Results    Tissue-Flow Cytometry - MONOCLONAL B CELL POPULATION IDENTIFIED. The phenotypic features are consistent with small lymphocytic lymphoma/chronic lymphocytic leukemia and correlate well with the morphology in the lymph node        CANCER STAGING: Cancer Staging No matching staging information was found for the patient.   INTERVAL HISTORY:  Adam Reyes 62 y.o. male returns for follow-up of skin rash she developed over the weekend.  He started noticing erythematous rash on the upper back on Friday which is 12/12/2017.  On Saturday it spread further to upper and lower extremities.  Hence he stopped taking ibrutinib on Saturday, which is 12/13/2017.  He thinks the rash has been stable.  His wife thinks it is slightly better today.  Denies any itching.  Denies any blisters.  He also noticed his mouth burns when he drank Dr. Malachi Bonds this morning.  On questioning, he is using alcohol mouthwash.  Denies any fevers or infections.    REVIEW OF SYSTEMS:  Review of Systems  Skin: Positive for rash.  All other  systems reviewed and are negative.    PAST MEDICAL/SURGICAL HISTORY:  Past Medical History:  Diagnosis Date  . 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 Enlarged lymph node    left neck  . STEMI (ST elevation myocardial infarction) ([place]) 03/05/2017   hx/notes 03/05/2017 -likely aborted anterior STEMI with 50% bifurcation LAD-Diag1.  No PCI.  Preserved EF   Past Surgical History:  Procedure Laterality Date  . COLONOSCOPY N/A 10/11/2015   Procedure: COLONOSCOPY;  Surgeon: Rogene Houston, MD;  [facility];  Service: Endoscopy;  Laterality: N/A;  10/11/2015  . Graded Exercise Tolerance Test (GXT/ETT)  05/2017   10.7 METs (9: 25 min.  Reached 103% max predicted heart rate).  No EKG findings to suggest coronary ischemia.  Negative, low risk GXT  . LEFT HEART CATH AND CORONARY ANGIOGRAPHY N/A 03/05/2017   Procedure: LEFT HEART CATH AND CORONARY ANGIOGRAPHY;  Surgeon: Leonie Man, MD;  Location: [facility];  Service: Cardiovascular: pLAD-Diag1 50-55% (non-flow-limiiting).  EF ~50-55%.  although bifurcation lesion was presumed Culprit - no PCI (not flow limiting). - Med Rx.  [name] MASS BIOPSY Left 06/27/2015   Procedure: OPEN LEFT NECK BIOPSY ;  Surgeon: Leta Baptist, MD;  Location: [facility];  Service: ENT;  Laterality: Left;  . REFRACTIVE SURGERY Bilateral      SOCIAL  HISTORY:  Social History   Socioeconomic History  . Marital status: Married    Spouse name: Not on file  . Number of children: Not on file  . Years of education: Not on file  . Highest education level: Not on file  Occupational History  . Not on file  Social Needs  . Financial resource strain: Not on file  . Food insecurity:    Worry: Not on file    Inability: Not on file  . Transportation needs:    Medical: Not on file    Non-medical:  Not on file  Tobacco Use  . Smoking status: Former Smoker    Years: 2.00    Types: Cigarettes  . Smokeless tobacco: Former Systems developer    Types: Chew, Snuff  . Tobacco comment: 03/06/2017 "quit smoking when I was young; quit chew/snuff in ~ 2008"  Substance and Sexual Activity  . Alcohol use: Yes    Comment: 03/06/2017 "maybe 6 pack beer/month"  . Drug use: No  . Sexual activity: Yes  Lifestyle  . Physical activity:    Days per week: Not on file    Minutes per session: Not on file  . Stress: Not on file  Relationships  . Social connections:    Talks on phone: Not on file    Gets together: Not on file    Attends religious service: Not on file    Active member of club or organization: Not on file    Attends meetings of clubs or organizations: Not on file    Relationship status: Not on file  . Intimate partner violence:    Fear of current or ex partner: Not on file    Emotionally abused: Not on file    Physically abused: Not on file    Forced sexual activity: Not on file  Other Topics Concern  . Not on file  Social History Narrative  . Not on file    FAMILY HISTORY:  Family History  Problem Relation Age of Onset  . Diabetes Mother   . Cancer Father   . Cancer Brother     CURRENT MEDICATIONS:  Outpatient Encounter Medications as of 12/15/2017  Medication Sig  . allopurinol (ZYLOPRIM) 300 MG tablet Take 1 tablet (300 mg total) by mouth 2 (two) times daily.  [name] aspirin 81 MG chewable tablet Chew 1 tablet (81 mg total) by mouth every other day.  [name] atorvastatin (LIPITOR) 80 MG tablet Take 1 tablet (80 mg total) by mouth daily at 6 PM.  . metoprolol tartrate (LOPRESSOR) 25 MG tablet Take 1 tablet (25 mg total) by mouth 2 (two) times daily.  . nitroGLYCERIN (NITROSTAT) 0.4 MG SL tablet Place 1 tablet (0.4 mg total) under the tongue every 5 (five) minutes as needed for chest pain.  . vitamin C (ASCORBIC ACID) 500 MG tablet Take 500 mg by mouth daily.  . Ibrutinib 420 MG TABS Take 1  capsule by mouth daily. Take same time every day with a glass of water (Patient not taking: Reported on 12/15/2017)  . methylPREDNISolone (MEDROL DOSEPAK) 4 MG TBPK tablet Take as directed   No facility-administered encounter medications on file as of 12/15/2017.     ALLERGIES:  No Known Allergies   PHYSICAL EXAM:  ECOG Performance status: 0  Vitals:   12/15/17 1635  BP: 118/63  Pulse: (!) 57  Resp: 16  Temp: 98.3 F (36.8 C)  SpO2: 97%   Filed Weights   12/15/17 1635  Weight: 256 lb (116.1  kg)    Physical Exam  Limited examination of the skin shows erythematous maculopapular rash on his back.  There is faint rash on the upper extremities.  Oral cavity did not reveal any ulcers. LABORATORY DATA:  I have reviewed the labs as listed.  CBC    Component Value Date/Time   WBC 4.6 12/11/2017 0900   RBC 4.47 12/11/2017 0900   HGB 13.8 12/11/2017 0900   HCT 41.2 12/11/2017 0900   PLT 107 (L) 12/11/2017 0900   MCV 92.2 12/11/2017 0900   MCH 30.9 12/11/2017 0900   MCHC 33.5 12/11/2017 0900   RDW 13.5 12/11/2017 0900   LYMPHSABS 1.2 12/11/2017 0900   MONOABS 0.5 12/11/2017 0900   EOSABS 0.1 12/11/2017 0900   BASOSABS 0.0 12/11/2017 0900   CMP Latest Ref Rng & Units 12/11/2017 11/27/2017 10/09/2017  Glucose 65 - 99 mg/dL 128(H) 86 82  BUN 6 - 20 mg/dL 21(H) 24(H) 21(H)  Creatinine 0.61 - 1.24 mg/dL 0.92 1.01 0.91  Sodium 135 - 145 mmol/L 138 141 139  Potassium 3.5 - 5.1 mmol/L 4.1 3.9 3.6  Chloride 101 - 111 mmol/L 104 104 104  CO2 22 - 32 mmol/L 26 27 25   Calcium 8.9 - 10.3 mg/dL 8.8(L) 9.6 9.4  Total Protein 6.5 - 8.1 g/dL 6.8 7.1 7.4  Total Bilirubin 0.3 - 1.2 mg/dL 1.4(H) 1.6(H) 1.9(H)  Alkaline Phos 38 - 126 U/L 63 67 72  AST 15 - 41 U/L 51(H) 38 35  ALT 17 - 63 U/L 66(H) 47 39        ASSESSMENT & PLAN:   Lymphoma, small lymphocytic (HCC) 1.  Stage IV SLL: -Patient developed rash which started on the upper back on 12/12/2017.  The rash spread by next day to  the anterior chest wall, upper thighs and upper extremities.  He called [place] and we held his Ibrutinib over the weekend. -he has maculopapular rash which is erythematous predominantly on the back.  There is fading rash on the upper extremities.  I have given a prescription for Medrol Dosepak to be taken as per instructions.  He was instructed to start back Ibrutinib at the same dose of 420 mg daily once the rash fades.  He has an appointment to see me next week. - He also developed some sensitivity to Dr. Malachi Bonds when he had it this morning with burning of the tongue.  He is also using alcohol mouthwash.  I have told him to discontinue alcohol mouthwash.  Ibrutinib is associated with mucositis and 15 to 30% of patients.      Orders placed this encounter:  No orders of the defined types were placed in this encounter.     Derek Jack, MD [place] [phone]

## 2017-12-15 NOTE — Assessment & Plan Note (Signed)
1.  Stage IV SLL: -Patient developed rash which started on the upper back on 12/12/2017.  The rash spread by next day to the anterior chest wall, upper thighs and upper extremities.  He called [place] and we held his Ibrutinib over the weekend. -he has maculopapular rash which is erythematous predominantly on the back.  There is fading rash on the upper extremities.  I have given a prescription for Medrol Dosepak to be taken as per instructions.  He was instructed to start back Ibrutinib at the same dose of 420 mg daily once the rash fades.  He has an appointment to see me next week. - He also developed some sensitivity to Dr. Malachi Bonds when he had it this morning with burning of the tongue.  He is also using alcohol mouthwash.  I have told him to discontinue alcohol mouthwash.  Ibrutinib is associated with mucositis and 15 to 30% of patients.

## 2017-12-22 ENCOUNTER — Inpatient Hospital Stay (HOSPITAL_COMMUNITY): Payer: 59

## 2017-12-22 DIAGNOSIS — Z5112 Encounter for antineoplastic immunotherapy: Secondary | ICD-10-CM | POA: Diagnosis not present

## 2017-12-22 DIAGNOSIS — C8300 Small cell B-cell lymphoma, unspecified site: Secondary | ICD-10-CM

## 2017-12-22 LAB — CBC WITH DIFFERENTIAL/PLATELET
BASOS PCT: 0 %
Basophils Absolute: 0 10*3/uL (ref 0.0–0.1)
EOS ABS: 0.1 10*3/uL (ref 0.0–0.7)
Eosinophils Relative: 1 %
HCT: 42.9 % (ref 39.0–52.0)
HEMOGLOBIN: 14.4 g/dL (ref 13.0–17.0)
Lymphocytes Relative: 21 %
Lymphs Abs: 1.7 10*3/uL (ref 0.7–4.0)
MCH: 31.4 pg (ref 26.0–34.0)
MCHC: 33.6 g/dL (ref 30.0–36.0)
MCV: 93.7 fL (ref 78.0–100.0)
Monocytes Absolute: 0.7 10*3/uL (ref 0.1–1.0)
Monocytes Relative: 9 %
NEUTROS PCT: 69 %
Neutro Abs: 5.3 10*3/uL (ref 1.7–7.7)
PLATELETS: 119 10*3/uL — AB (ref 150–400)
RBC: 4.58 MIL/uL (ref 4.22–5.81)
RDW: 14 % (ref 11.5–15.5)
WBC: 7.8 10*3/uL (ref 4.0–10.5)

## 2017-12-22 LAB — COMPREHENSIVE METABOLIC PANEL
ALT: 45 U/L (ref 17–63)
ANION GAP: 7 (ref 5–15)
AST: 28 U/L (ref 15–41)
Albumin: 4.1 g/dL (ref 3.5–5.0)
Alkaline Phosphatase: 60 U/L (ref 38–126)
BILIRUBIN TOTAL: 1.1 mg/dL (ref 0.3–1.2)
BUN: 26 mg/dL — AB (ref 6–20)
CO2: 29 mmol/L (ref 22–32)
Calcium: 9.1 mg/dL (ref 8.9–10.3)
Chloride: 104 mmol/L (ref 101–111)
Creatinine, Ser: 0.84 mg/dL (ref 0.61–1.24)
Glucose, Bld: 96 mg/dL (ref 65–99)
POTASSIUM: 3.9 mmol/L (ref 3.5–5.1)
SODIUM: 140 mmol/L (ref 135–145)
TOTAL PROTEIN: 6.4 g/dL — AB (ref 6.5–8.1)

## 2017-12-22 LAB — LACTATE DEHYDROGENASE: LDH: 135 U/L (ref 98–192)

## 2017-12-22 LAB — URIC ACID: URIC ACID, SERUM: 3.3 mg/dL — AB (ref 4.4–7.6)

## 2017-12-22 LAB — PHOSPHORUS: Phosphorus: 3.6 mg/dL (ref 2.5–4.6)

## 2017-12-25 ENCOUNTER — Other Ambulatory Visit: Payer: Self-pay

## 2017-12-25 ENCOUNTER — Other Ambulatory Visit (HOSPITAL_COMMUNITY): Payer: 59

## 2017-12-25 ENCOUNTER — Encounter (HOSPITAL_COMMUNITY): Payer: Self-pay | Admitting: Hematology

## 2017-12-25 ENCOUNTER — Inpatient Hospital Stay (HOSPITAL_BASED_OUTPATIENT_CLINIC_OR_DEPARTMENT_OTHER): Payer: 59 | Admitting: Hematology

## 2017-12-25 DIAGNOSIS — C8300 Small cell B-cell lymphoma, unspecified site: Secondary | ICD-10-CM

## 2017-12-25 DIAGNOSIS — C9110 Chronic lymphocytic leukemia of B-cell type not having achieved remission: Secondary | ICD-10-CM

## 2017-12-25 DIAGNOSIS — Z5112 Encounter for antineoplastic immunotherapy: Secondary | ICD-10-CM | POA: Diagnosis not present

## 2017-12-25 MED ORDER — ALLOPURINOL 300 MG PO TABS: 300.0000 mg | ORAL_TABLET | Freq: Every day | ORAL | 0 refills | Status: DC

## 2017-12-25 NOTE — Progress Notes (Signed)
Adam Reyes, [place] [id number]   CLINIC:  Medical Oncology/Hematology  PCP:  Asencion Noble, [employer] [place] [address] [phone]   REASON FOR VISIT:  Follow-up for small lymphocytic lymphoma.  CURRENT THERAPY: Ibrutinib for 20 mg daily.  BRIEF ONCOLOGIC HISTORY:    Chronic lymphocytic leukemia (CLL), B-cell ([facility])   06/14/2015 Imaging    CT neck- Bulky adenopathy throughout the neck bilaterally. There also are enlarged parotid lymph nodes bilaterally. There is bilateral axillary adenopathy as well as mediastinal adenopathy. Findings are consistent with lymphoma. Biopsy recommended.      06/27/2015 Procedure    Left neck lymph node biopsy by Dr. Benjamine Mola      06/27/2015 Pathology Results    Diagnosis Lymph node for lymphoma, Left neck node for lymphoma work up - [place].  LOW GRADE.      06/27/2015 Pathology Results    Tissue-Flow Cytometry - MONOCLONAL B CELL POPULATION IDENTIFIED. The phenotypic features are consistent with small lymphocytic lymphoma/chronic lymphocytic leukemia and correlate well with the morphology in the lymph node       Lymphoma, small lymphocytic ([facility])   10/14/2017 Initial Diagnosis    Lymphoma, small lymphocytic ([facility])      12/25/2017 -  Chemotherapy    The patient had riTUXimab (RITUXAN) 900 mg in sodium chloride 0.9 % 250 mL (2.6471 mg/mL) infusion, 375 mg/m2 = 900 mg, Intravenous,  Once, 0 of 4 cycles Dose modification: 500 mg/m2 (Cycle 2, Reason: Provider Judgment)  for chemotherapy treatment.         INTERVAL HISTORY:  Mr. Kari 62 y.o. male returns for follow-up of small lymphocytic lymphoma.  He started back on ibrutinib on 12/16/2017 without any side effects.  He did not get any mucositis.  Denies any fevers or night sweats.  His lymph node swelling is continued to improve.  He is continuing to do full-time job.  Appetite has been great.  No active bleeding was  reported.  No palpitations reported.   REVIEW OF SYSTEMS:  Review of Systems  Hematological: Bruises/bleeds easily.     PAST MEDICAL/SURGICAL HISTORY:  Past Medical History:  Diagnosis Date  . 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 Enlarged lymph node    left neck  . STEMI (ST elevation myocardial infarction) ([facility]) 03/05/2017   hx/notes 03/05/2017 -likely aborted anterior STEMI with 50% bifurcation LAD-Diag1.  No PCI.  Preserved EF   Past Surgical History:  Procedure Laterality Date  . COLONOSCOPY N/A 10/11/2015   Procedure: COLONOSCOPY;  Surgeon: Rogene Houston, MD;  Location: [facility];  Service: Endoscopy;  Laterality: N/A;  10/11/2015  . Graded Exercise Tolerance Test (GXT/ETT)  05/2017   10.7 METs (9: 25 min.  Reached 103% max predicted heart rate).  No EKG findings to suggest coronary ischemia.  Negative, low risk GXT  . LEFT HEART CATH AND CORONARY ANGIOGRAPHY N/A 03/05/2017   Procedure: LEFT HEART CATH AND CORONARY ANGIOGRAPHY;  Surgeon: Leonie Man, MD;  Location: [facility];  Service: Cardiovascular: pLAD-Diag1 50-55% (non-flow-limiiting).  EF ~50-55%.  although bifurcation lesion was presumed Culprit - no PCI (not flow limiting). - Med Rx.  [name] MASS BIOPSY Left 06/27/2015   Procedure: OPEN LEFT NECK BIOPSY ;  Surgeon: Leta Baptist, MD;  Location: [facility];  Service: ENT;  Laterality: Left;  . REFRACTIVE SURGERY Bilateral      SOCIAL HISTORY:  Social History   Socioeconomic History  . Marital status: Married    Spouse name: Not on file  . Number of children: Not on file  . Years of education: Not on file  . Highest education level: Not on file  Occupational History  . Not on file  Social Needs  . Financial resource strain: Not on file  . Food insecurity:    Worry: Not on file     Inability: Not on file  . Transportation needs:    Medical: Not on file    Non-medical: Not on file  Tobacco Use  . Smoking status: Former Smoker    Years: 2.00    Types: Cigarettes  . Smokeless tobacco: Former Systems developer    Types: Chew, Snuff  . Tobacco comment: 03/06/2017 "quit smoking when I was young; quit chew/snuff in ~ 2008"  Substance and Sexual Activity  . Alcohol use: Yes    Comment: 03/06/2017 "maybe 6 pack beer/month"  . Drug use: No  . Sexual activity: Yes  Lifestyle  . Physical activity:    Days per week: Not on file    Minutes per session: Not on file  . Stress: Not on file  Relationships  . Social connections:    Talks on phone: Not on file    Gets together: Not on file    Attends religious service: Not on file    Active member of club or organization: Not on file    Attends meetings of clubs or organizations: Not on file    Relationship status: Not on file  . Intimate partner violence:    Fear of current or ex partner: Not on file    Emotionally abused: Not on file    Physically abused: Not on file    Forced sexual activity: Not on file  Other Topics Concern  . Not on file  Social History Narrative  . Not on file    FAMILY HISTORY:  Family History  Problem Relation Age of Onset  . Diabetes Mother   . Cancer Father   . Cancer Brother     CURRENT MEDICATIONS:  Outpatient Encounter Medications as of 12/25/2017  Medication Sig  . allopurinol (ZYLOPRIM) 300 MG tablet Take 1 tablet (300 mg total) by mouth daily.  [name] aspirin 81 MG chewable tablet Chew 1 tablet (81 mg total) by mouth every other day.  [name] atorvastatin (LIPITOR) 80 MG tablet Take 1 tablet (80 mg total) by mouth daily at 6 PM.  . chlorhexidine (PERIDEX) 0.12 % solution   . Ibrutinib 420 MG TABS Take 1 capsule by mouth daily. Take same time every day with a glass of water  . metoprolol tartrate (LOPRESSOR) 25 MG tablet Take 1 tablet (25 mg total) by mouth 2 (two) times daily.  . naproxen sodium  (ANAPROX) 550 MG tablet TK 1 T PO Q 12 H PRN  . nitroGLYCERIN (NITROSTAT) 0.4 MG SL tablet Place 1 tablet (0.4 mg total) under the tongue every 5 (five) minutes as needed for chest pain.  . vitamin C (ASCORBIC ACID) 500 MG tablet Take 500 mg by mouth daily.  . [DISCONTINUED] allopurinol (ZYLOPRIM) 300 MG tablet Take 1 tablet (300 mg total) by mouth 2 (two) times daily.  . [DISCONTINUED] methylPREDNISolone (MEDROL DOSEPAK) 4 MG TBPK tablet Take as directed   No facility-administered encounter medications on file as of 12/25/2017.  ALLERGIES:  No Known Allergies   PHYSICAL EXAM:  ECOG Performance status: 0  Vitals:   12/25/17 1024  BP: 134/80  Pulse: (!) 49  Resp: 16  Temp: 97.8 F (36.6 C)  SpO2: 98%   Filed Weights   12/25/17 1024  Weight: 262 lb 9.6 oz (119.1 kg)    Physical Exam HEENT: No mucositis or thrush. Lymphadenopathy: Neck lymph nodes continue to decrease in size.  No axillary lymph nodes palpable. Chest: Bilateral clear to auscultation. CVS: S1-S2 regular rate and rhythm. Extremities: No edema or cyanosis.  LABORATORY DATA:  I have reviewed the labs as listed.  CBC    Component Value Date/Time   WBC 7.8 12/22/2017 1046   RBC 4.58 12/22/2017 1046   HGB 14.4 12/22/2017 1046   HCT 42.9 12/22/2017 1046   PLT 119 (L) 12/22/2017 1046   MCV 93.7 12/22/2017 1046   MCH 31.4 12/22/2017 1046   MCHC 33.6 12/22/2017 1046   RDW 14.0 12/22/2017 1046   LYMPHSABS 1.7 12/22/2017 1046   MONOABS 0.7 12/22/2017 1046   EOSABS 0.1 12/22/2017 1046   BASOSABS 0.0 12/22/2017 1046   CMP Latest Ref Rng & Units 12/22/2017 12/11/2017 11/27/2017  Glucose 65 - 99 mg/dL 96 128(H) 86  BUN 6 - 20 mg/dL 26(H) 21(H) 24(H)  Creatinine 0.61 - 1.24 mg/dL 0.84 0.92 1.01  Sodium 135 - 145 mmol/L 140 138 141  Potassium 3.5 - 5.1 mmol/L 3.9 4.1 3.9  Chloride 101 - 111 mmol/L 104 104 104  CO2 22 - 32 mmol/L 29 26 27   Calcium 8.9 - 10.3 mg/dL 9.1 8.8(L) 9.6  Total Protein 6.5 - 8.1 g/dL  6.4(L) 6.8 7.1  Total Bilirubin 0.3 - 1.2 mg/dL 1.1 1.4(H) 1.6(H)  Alkaline Phos 38 - 126 U/L 60 63 67  AST 15 - 41 U/L 28 51(H) 38  ALT 17 - 63 U/L 45 66(H) 47        ASSESSMENT & PLAN:   Lymphoma, small lymphocytic ([facility]) 1.  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  No evidence of transformation. 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  - Bone marrow aspirate and flow cytometry positive for lymphoma involvement, chromosome analysis normal, CLL FISH panel normal - Plavix was discontinued after contacting his cardiologist Dr. Ellyn Hack -Ibrutinib 420 mg daily started on 12/01/2017, developed skin rash and ibrutinib was held on 12/13/2017 through 12/16/2017.  Rash improved with steroids and Ibrutinib was restarted back at the same dose on 12/16/2017.  He did not get any further rash.  No other side effects were noted.  Mucositis was also gone. Today his blood counts continue to show improvement in the platelet count.  We talked about starting him on rituximab with cycle 2 which will be next week.  He will receive 375 mg/m dose with cycle 2 and then we will increase it to 500 mg/m cycle 3 through cycle 7.  We talked about the side effects in detail.  His hepatitis panel is already negative.  2.  TLS prophylaxis: -His uric acid today is 3.3.  He will continue allopurinol 300 mg twice daily for the first month.   I have given another prescription for allopurinol 300 mg once daily for 1 month.       Orders placed this encounter:  No orders of the defined types were placed in this encounter.     Derek Jack, MD [place] [phone]

## 2017-12-25 NOTE — Progress Notes (Signed)
Pt completed steroid treatment for recent rash. Pt has had a 6 pound weight gain. Pt denies any problems or concerns at this time. Pt's right eye noted to be red and irritated, pt denies any pain or itching and states that he hadn't even noticed.

## 2017-12-25 NOTE — Patient Instructions (Addendum)
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 The goal for your treatment is control. We will begin treatment with rituxumab (Rituxan) every 3 weeks in addition to your taking your Imbruvica as prescribed. You will see the doctor regularly throughout treatment.  We monitor your lab work prior to every treatment.  The doctor monitors your response to treatment by the way you are feeling, your blood work, and scans periodically.  During your treatment there will be wait times.  It is about 30 minutes to 1 hour wait for lab work to result. Then there is a wait time for pharmacy to prepare your medication.    You will have the following premedications prior to receiving chemotherapy:  Premeds:  Benadryl -  Helps prevent an allergic-like reaction to the Rituxan.  Tylenol - helps prevent infusion reaction to Rituxan. Pepcid - antihistamine - helps prevent an allergic-like reaction to the Rituxan.   POTENTIAL SIDE EFFECTS OF TREATMENT:  Rituximab (Generic Name) Other Name: Rituxan  About This Drug Rituximab is a monoclonal antibody used to treat cancer. This drug is given in the vein (IV).  Possible Side Effects (More Common) . Bone marrow depression. This is a decrease in the number of white blood cells, red blood cells, and platelets. This may raise your risk of infection, make you tired and weak (fatigue), and raise your risk of bleeding. . Rash-skin irritation, redness or itching (dermatitis) . Flu-like symptoms: fever, headache, muscle and joint aches, and fatigue (low energy, feeling weak) . Infusion-related reactions . Hepatitis B - if you have ever had hepatitis B, the virus may come back during treatment with this drug. Your doctor will test to see if you have ever had hepatitis B prior to your treatment. . Changes in your central nervous system can happen. The central nervous system  is made up of your brain and spinal cord. You could feel: extreme tiredness, agitation, confusion, or have: hallucinations (see or hear things that are not there), trouble understanding or speaking, loss of control of your bowels or bladder, eyesight changes, numbness or lack of strength to your arms, legs, face, or body, seizures or coma. If you start to have any of these symptoms let your doctor know right away. . Tumor lysis: This drug may act on the cancer cells very quickly. This may affect how your kidneys work. Your doctor will monitor your kidney function. . Changes in your liver function. Your doctor will check your liver function as needed. . Nausea and throwing up (vomiting): these symptoms may happen within a few hours after your treatment and may last up to 24 hours. Medicines are available to stop or lessen these side effects. . Loose bowel movements (diarrhea) that may last for a few days . Abdominal pain . Infections . Cough, runny nose . Swelling of your legs, ankles and/or feet or hands . High blood pressure Your doctor will check your blood pressure as needed. . Abnormal heart beat  Possible Side Effects (Less Common) . Shortness of breath . Soreness of the mouth and throat. You may have red areas, white patches, or sores that hurt.  Infusion Reactions Infusion Reactions are the most common side effect linked to use of this drug and can be quite severe. Medicines will be given before you get the drug to lower the severity of this side effect. The infusion reactions are the worst with the first  dose of the drug and become less severe with more doses of the drug. While you are getting this drug in your vein (IV), tell your nurse right away if you have any of these symptoms of an allergic reaction: . Trouble catching your breath . Feeling like your tongue or throat are swelling . Feeling your heart beat quickly or in a not normal way (palpitations) . Feeling dizzy or  lightheaded . Flushing, itching, rash, and/or hives  Treating Side Effects . Ask your doctor or nurse about medicine to stop or lessen headache, loose bowel movements (diarrhea), constipation, nausea, throwing up (vomiting), or pain. . If you get a rash do not put anything it unless your doctor or nurse says you may. Keep the area around the rash clean and dry. Ask your doctor for medicine if the rash bothers you. . Drink 6-8 cups of fluids each day unless your doctor has told you to limit your fluid intake due to some other health problem. A cup is 8 ounces of fluid. If you throw up or have loose bowel movements, you should drink more fluids so that you do not become dehydrated (lack of water in the body from losing too much fluid). . If you are not able to move your bowels, check with your doctor or nurse before you use enemas, laxatives, or suppositories . If you have mouth sores, avoid mouthwash that has alcohol. Also avoid alcohol and smoking because they can bother your mouth and throat. . If you have a nose bleed, sit with your head tipped slightly forward. Apply pressure by lightly pinching the bridge of your nose between your thumb and forefinger. Call your doctor if you feel dizzy or faint or if the bleeding doesn't stop after 10 to 15 minutes  Important Information . After treatment with this drug, vaccination with live viruses should be delayed until the immune system recovers. . Symptoms of abnormal bleeding may be: coughing up blood, throwing up blood (may look like coffee grounds), red or black, tarry bowel movements, blood in urine, abnormally heavy menstrual flow, nosebleeds, or any unusual bleeding. . Symptoms of high blood pressure may be: headache, blurred vision, confusion, chest pain, or a feeling that your heart is beat differently. [name] Urinary tract infection. Symptoms may include: . Pain or burning when you pass urine . Feeling like you have to pass urine often, but not much  comes out when you do. . Tender or heavy feeling in your lower abdomen . Cloudy urine and/or urine that smells bad. . Pain on one side of your back under your ribs. This is where your kidneys are. . Fever, chills, nausea and/or throwing up  Food and Drug Interactions There are no known interactions of rituximab and any food. This drug may interact with other medicines. Tell your doctor and pharmacist about all the medicines and dietary supplements (vitamins, minerals, herbs and others) that you are taking at this time. The safety and use of dietary supplements and alternative diets are often not known. Using these might affect your cancer or interfere with your treatment. Until more is known, you should not use dietary supplements or alternative diets without your cancer doctor's help.  When to Call the Doctor Call your doctor or nurse right away if you have any of these symptoms: . Fever of 100.5 F (38 C) higher . Chills . Trouble breathing . Rash with or without itching . Blistering or peeling of skin . Chest pain or symptoms of a  heart attack. Most heart attacks involve pain in the center of the chest that lasts more than a few minutes. The pain may go away and come back or it can be constant. It can feel like pressure, squeezing, fullness, or pain. Sometimes pain is felt in one or both arms, the back, neck, jaw, or stomach. If any of these symptoms last 2 minutes, call 911 . Easy bleeding or bruising . Blood in urine or bowel movements . Feeling that your heart is beating in a fast or not normal way (palpitations) . Nausea that stops you from eating or drinking . Throwing up (vomiting) more than 3 times in one day . Abdominal pain . Loose bowel movements (diarrhea) 4 times in one day or diarrhea with weakness or lightheadedness . No bowel movement in 3 days or if you feel uncomfortable . Feeling dizzy or lightheaded . Changes in your speech or vision    EDUCATIONAL MATERIALS GIVEN  AND REVIEWED: Information on rituxan given.   SELF CARE ACTIVITIES WHILE ON CHEMOTHERAPY:  Hydration  Increase your fluid intake 48 hours prior to treatment and drink at least 8 to 12 cups (64 ounces) of water/decaff beverages per day after treatment. You can still have your cup of coffee or soda but these beverages do not count as part of your 8 to 12 cups that you need to drink daily. No alcohol intake.  Medications  Continue taking your normal prescription medication as prescribed.  If you start any new herbal or new supplements please let us know first to make sure it is safe.  Mouth Care  Have teeth cleaned professionally before starting treatment. Keep dentures and partial plates clean. Use soft toothbrush and do not use mouthwashes that contain alcohol. Biotene is a good mouthwash that is available at most pharmacies or may be ordered by calling [phone]. Use warm salt water gargles (1 teaspoon salt per 1 quart warm water) before and after meals and at bedtime. Or you may rinse with 2 tablespoons of three-percent hydrogen peroxide mixed in eight ounces of water. If you are still having problems with your mouth or sores in your mouth please call the clinic. If you need dental work, please let Dr. Delton Coombes know before you go for your appointment so that we can coordinate the best possible time for you in regards to your chemo regimen. You need to also let your dentist know that you are actively taking chemo. We may need to do labs prior to your dental appointment.   Skin Care  Always use sunscreen that has not expired and with SPF (Sun Protection Factor) of 50 or higher. Wear hats to protect your head from the sun. Remember to use sunscreen on your hands, ears, face, & feet.  Use good moisturizing lotions such as udder cream, eucerin, or even Vaseline. Some chemotherapies can cause dry skin, color changes in your skin and nails.    . Avoid long, hot showers or baths. . Use  gentle, fragrance-free soaps and laundry detergent. . Use moisturizers, preferably creams or ointments rather than lotions because the thicker consistency is better at preventing skin dehydration. Apply the cream or ointment within 15 minutes of showering. Reapply moisturizer at night, and moisturize your hands every time after you wash them.   Infection Prevention  Please wash your hands for at least 30 seconds using warm soapy water. Handwashing is the #1 way to prevent the spread of germs. Stay away from sick people or people who  are getting over a cold. If you develop respiratory systems such as green/yellow mucus production or productive cough or persistent cough let us know and we will see if you need an antibiotic. It is a good idea to keep a pair of gloves on when going into grocery stores/[employer] to decrease your risk of coming into contact with germs on the carts, etc. Carry alcohol hand gel with you at all times and use it frequently if out in public. If your temperature reaches 100.5 or higher please call the clinic and let us know.  If it is after hours or on the weekend please go to the ER if your temperature is over 100.5.  Please have your own personal thermometer at home to use.    Sex and bodily fluids  If you are going to have sex, a condom must be used to protect the person that isn't taking chemotherapy. Chemo can decrease your libido (sex drive). For a few days after chemotherapy, chemotherapy can be excreted through your bodily fluids.  When using the toilet please close the lid and flush the toilet twice.  Do this for a few day after you have had chemotherapy.   Effects of chemotherapy on your sex life  Some changes are simple and won't last long. They won't affect your sex life permanently. Sometimes you may feel: . too tired . not strong enough to be very active . sick or sore  . not in the mood . anxious or low Your anxiety might not seem related to sex. For example,  you may be worried about the cancer and how your treatment is going. Or you may be worried about money, or about how you family are coping with your illness. These things can cause stress, which can affect your interest in sex. It's important to talk to your partner about how you feel. Remember - the changes to your sex life don't usually last long. There's usually no medical reason to stop having sex during chemo. The drugs won't have any long term physical effects on your performance or enjoyment of sex. Cancer can't be passed on to your partner during sex  Contraception  It's important to use reliable contraception during treatment. Avoid getting pregnant while you or your partner are having chemotherapy. This is because the drugs may harm the baby. Sometimes chemotherapy drugs can leave a man or woman infertile.  This means you would not be able to have children in the future. You might want to talk to someone about permanent infertility. It can be very difficult to learn that you may no longer be able to have children. Some people find counselling helpful. There might be ways to preserve your fertility, although this is easier for men than for women. You may want to speak to a fertility expert. You can talk about sperm banking or harvesting your eggs. You can also ask about other fertility options, such as donor eggs. If you have or have had breast cancer, your doctor might advise you not to take the contraceptive pill. This is because the hormones in it might affect the cancer.  It is not known for sure whether or not chemotherapy drugs can be passed on through semen or secretions from the vagina. Because of this some doctors advise people to use a barrier method if you have sex during treatment. This applies to vaginal, anal or oral sex. Generally, doctors advise a barrier method only for the time you are actually having the treatment  and for about a week after your treatment. Advice like this can  be worrying, but this does not mean that you have to avoid being intimate with your partner. You can still have close contact with your partner and continue to enjoy sex.  Animals  If you have cats or birds we just ask that you not change the litter or change the cage.  Please have someone else do this for you while you are on chemotherapy.     Food Safety During and After Cancer Treatment Food safety is important for people both during and after cancer treatment. Cancer and cancer treatments, such as chemotherapy, radiation therapy, and stem cell/bone marrow transplantation, often weaken the immune system. This makes it harder for your body to protect itself from foodborne illness, also called food poisoning. Foodborne illness is caused by eating food that contains harmful bacteria, parasites, or viruses.  Foods to avoid Some foods have a higher risk of becoming tainted with bacteria. These include: [name] Unwashed fresh fruit and vegetables, especially leafy vegetables that can hide dirt and other contaminants . Raw sprouts, such as alfalfa sprouts . Raw or undercooked beef, especially ground beef, or other raw or undercooked meat and poultry . Fatty, fried, or spicy foods immediately before or after treatment.  These can sit heavy on your stomach and make you feel nauseous. . Raw or undercooked shellfish, such as oysters. . Sushi and sashimi, which often contain raw fish.  . Unpasteurized beverages, such as unpasteurized fruit juices, raw milk, raw yogurt, or cider . Undercooked eggs, such as soft boiled, over easy, and poached; raw, unpasteurized eggs; or foods made with raw egg, such as homemade raw cookie dough and homemade mayonnaise  Simple steps for food safety  Shop smart. . Do not buy food stored or displayed in an unclean area. . Do not buy bruised or damaged fruits or vegetables. . Do not buy cans that have cracks, dents, or bulges. . Pick up foods that can spoil at the end of  your shopping trip and store them in a cooler on the way home.        Prepare and clean up foods carefully. . Rinse all fresh fruits and vegetables under running water, and dry them with a clean towel or paper towel. . Clean the top of cans before opening them. . After preparing food, wash your hands for 20 seconds with hot water and soap. Pay special attention to areas between fingers and under nails. . Clean your utensils and dishes with hot water and soap. [name] Disinfect your kitchen and cutting boards using 1 teaspoon of liquid, unscented bleach mixed into 1 quart of water.     Dispose of old food. . Eat canned and packaged food before its expiration date (the "use by" or "best before" date). . Consume refrigerated leftovers within 3 to 4 days. After that time, throw out the food. Even if the food does not smell or look spoiled, it still may be unsafe. Some bacteria, such as Listeria, can grow even on foods stored in the refrigerator if they are kept for too long. Take precautions when eating out. . At restaurants, avoid buffets and salad bars where food sits out for a long time and comes in contact with many people. Food can become contaminated when someone with a virus, often a norovirus, or another "bug" handles it. . Put any leftover food in a "to-go" container yourself, rather than having the server do it. And, refrigerate  leftovers as soon as you get home. . Choose restaurants that are clean and that are willing to prepare your food as you order it cooked.  SYMPTOMS TO REPORT AS SOON AS POSSIBLE AFTER TREATMENT:  FEVER GREATER THAN 100.5 F  CHILLS WITH OR WITHOUT FEVER  NAUSEA AND VOMITING THAT IS NOT CONTROLLED WITH YOUR NAUSEA MEDICATION  UNUSUAL SHORTNESS OF BREATH  UNUSUAL BRUISING OR BLEEDING  TENDERNESS IN MOUTH AND THROAT WITH OR WITHOUT PRESENCE OF ULCERS  URINARY PROBLEMS  BOWEL PROBLEMS  UNUSUAL RASH    What to do if you need assistance after hours or on  the weekends: CALL [phone].  HOLD on the line, do not hang up.  You will hear multiple messages but at the end you will be connected with a nurse triage line.  They will contact Dr. Delton Coombes if necessary.  Most of the time they will be able to assist you.   Do not call the hospital operator. Dr. Delton Coombes will not answer phone calls received by them.     I have been informed and understand all of the instructions given to me and have received a copy. I have been instructed to call the clinic [phone] or my family physician as soon as possible for continued medical care, if indicated. I do not have any more questions at this time but understand that I may call the [facility] or the Patient Navigator at [phone] during office hours should I have questions or need assistance in obtaining follow-up care.        [facility] Discharge Instructions for Patients Receiving Chemotherapy   Beginning January 23rd 2017 lab work for the [facility] will be done in the  Main lab at [facility] on 1st floor. If you have a lab appointment with the [facility] please come in thru the  Main Entrance and check in at the main information desk   Today you received the following chemotherapy agents   To help prevent nausea and vomiting after your treatment, we encourage you to take your nausea medication     If you develop nausea and vomiting, or diarrhea that is not controlled by your medication, call the clinic.  The clinic phone number is [phone]. Office hours are Monday-Friday 8:30am-5:00pm.  BELOW ARE SYMPTOMS THAT SHOULD BE REPORTED IMMEDIATELY:  *FEVER GREATER THAN 101.0 F  *CHILLS WITH OR WITHOUT FEVER  NAUSEA AND VOMITING THAT IS NOT CONTROLLED WITH YOUR NAUSEA MEDICATION  *UNUSUAL SHORTNESS OF BREATH  *UNUSUAL BRUISING OR BLEEDING  TENDERNESS IN MOUTH AND THROAT WITH OR WITHOUT PRESENCE OF ULCERS  *URINARY PROBLEMS  *BOWEL  PROBLEMS  UNUSUAL RASH Items with * indicate a potential emergency and should be followed up as soon as possible. If you have an emergency after office hours please contact your primary care physician or go to the nearest emergency department.  Please call the clinic during office hours if you have any questions or concerns.   You may also contact the Patient Navigator at [phone] should you have any questions or need assistance in obtaining follow up care.      Resources For Cancer Patients and their Caregivers ? American Cancer Society: Can assist with transportation, wigs, general needs, runs Look Good Feel Better.        510-411-6932 ? Cancer Care: Provides financial assistance, online support groups, medication/co-pay assistance.  1-800-813-HOPE (442) 607-1204) ? [facility] Assists [place] cancer patients and their families  through emotional , educational and financial support.  4585036132 ? [place] DSS Where to apply for food stamps, Medicaid and utility assistance. (740)497-0691 ? [facility]: Transportation to medical appointments. 231-424-5563 ? Social Security Administration: May apply for disability if have a Stage IV cancer. 731-611-3416 ([phone] ? [employer], Disability and Transit Services: Assists with nutrition, care and transit needs. [phone]

## 2017-12-25 NOTE — Assessment & Plan Note (Signed)
1.  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  No evidence of transformation. 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  - Bone marrow aspirate and flow cytometry positive for lymphoma involvement, chromosome analysis normal, CLL FISH panel normal - Plavix was discontinued after contacting his cardiologist Dr. Ellyn Hack -Ibrutinib 420 mg daily started on 12/01/2017, developed skin rash and ibrutinib was held on 12/13/2017 through 12/16/2017.  Rash improved with steroids and Ibrutinib was restarted back at the same dose on 12/16/2017.  He did not get any further rash.  No other side effects were noted.  Mucositis was also gone. Today his blood counts continue to show improvement in the platelet count.  We talked about starting him on rituximab with cycle 2 which will be next week.  He will receive 375 mg/m dose with cycle 2 and then we will increase it to 500 mg/m cycle 3 through cycle 7.  We talked about the side effects in detail.  His hepatitis panel is already negative.  2.  TLS prophylaxis: -His uric acid today is 3.3.  He will continue allopurinol 300 mg twice daily for the first month.   I have given another prescription for allopurinol 300 mg once daily for 1 month.

## 2017-12-29 ENCOUNTER — Telehealth (HOSPITAL_COMMUNITY): Payer: Self-pay

## 2017-12-29 NOTE — Telephone Encounter (Signed)
Received telephone advice record via fax where patient had called in on 12/28/17 c/o nausea and vomiting the night before. Reviewed with provider. Called patient to see how he is doing. He is at work and states it must have been something he ate or a 24 hour bug. He states his family (son, daughter, grandkids) has had a virus this last week. Denies any pain or fever. Instructed patient to call again if he has anymore problems or go to ER if after hours.

## 2018-01-01 ENCOUNTER — Inpatient Hospital Stay (HOSPITAL_COMMUNITY): Payer: 59

## 2018-01-01 ENCOUNTER — Other Ambulatory Visit: Payer: Self-pay

## 2018-01-01 ENCOUNTER — Encounter (HOSPITAL_COMMUNITY): Payer: Self-pay

## 2018-01-01 ENCOUNTER — Other Ambulatory Visit (HOSPITAL_COMMUNITY): Payer: 59

## 2018-01-01 VITALS — BP 111/59 | HR 57 | Temp 97.7°F | Resp 18 | Wt 251.1 lb

## 2018-01-01 DIAGNOSIS — Z5112 Encounter for antineoplastic immunotherapy: Secondary | ICD-10-CM | POA: Diagnosis not present

## 2018-01-01 DIAGNOSIS — C9110 Chronic lymphocytic leukemia of B-cell type not having achieved remission: Secondary | ICD-10-CM

## 2018-01-01 DIAGNOSIS — C8300 Small cell B-cell lymphoma, unspecified site: Secondary | ICD-10-CM

## 2018-01-01 LAB — CBC WITH DIFFERENTIAL/PLATELET
Basophils Absolute: 0 10*3/uL (ref 0.0–0.1)
Basophils Relative: 1 %
Eosinophils Absolute: 0.1 10*3/uL (ref 0.0–0.7)
Eosinophils Relative: 1 %
HEMATOCRIT: 43.7 % (ref 39.0–52.0)
HEMOGLOBIN: 14.9 g/dL (ref 13.0–17.0)
LYMPHS ABS: 1.2 10*3/uL (ref 0.7–4.0)
LYMPHS PCT: 20 %
MCH: 31.6 pg (ref 26.0–34.0)
MCHC: 34.1 g/dL (ref 30.0–36.0)
MCV: 92.8 fL (ref 78.0–100.0)
MONOS PCT: 8 %
Monocytes Absolute: 0.5 10*3/uL (ref 0.1–1.0)
NEUTROS ABS: 4.4 10*3/uL (ref 1.7–7.7)
NEUTROS PCT: 70 %
Platelets: 116 10*3/uL — ABNORMAL LOW (ref 150–400)
RBC: 4.71 MIL/uL (ref 4.22–5.81)
RDW: 13.6 % (ref 11.5–15.5)
WBC: 6.2 10*3/uL (ref 4.0–10.5)

## 2018-01-01 LAB — COMPREHENSIVE METABOLIC PANEL
ALBUMIN: 4.1 g/dL (ref 3.5–5.0)
ALK PHOS: 60 U/L (ref 38–126)
ALT: 51 U/L (ref 17–63)
ANION GAP: 7 (ref 5–15)
AST: 44 U/L — ABNORMAL HIGH (ref 15–41)
BILIRUBIN TOTAL: 1.6 mg/dL — AB (ref 0.3–1.2)
BUN: 21 mg/dL — ABNORMAL HIGH (ref 6–20)
CALCIUM: 9 mg/dL (ref 8.9–10.3)
CO2: 27 mmol/L (ref 22–32)
Chloride: 104 mmol/L (ref 101–111)
Creatinine, Ser: 0.87 mg/dL (ref 0.61–1.24)
GLUCOSE: 110 mg/dL — AB (ref 65–99)
Potassium: 3.6 mmol/L (ref 3.5–5.1)
Sodium: 138 mmol/L (ref 135–145)
TOTAL PROTEIN: 6.6 g/dL (ref 6.5–8.1)

## 2018-01-01 MED ORDER — ACETAMINOPHEN 325 MG PO TABS
650.0000 mg | ORAL_TABLET | Freq: Once | ORAL | Status: AC
  Administered 2018-01-01: 650 mg via ORAL
  Filled 2018-01-01: qty 2

## 2018-01-01 MED ORDER — SODIUM CHLORIDE 0.9 % IV SOLN
375.0000 mg/m2 | Freq: Once | INTRAVENOUS | Status: AC
  Administered 2018-01-01: 900 mg via INTRAVENOUS
  Filled 2018-01-01: qty 40

## 2018-01-01 MED ORDER — PROCHLORPERAZINE MALEATE 10 MG PO TABS: 10.0000 mg | ORAL_TABLET | Freq: Four times a day (QID) | ORAL | 2 refills | Status: DC | PRN

## 2018-01-01 MED ORDER — FAMOTIDINE IN NACL 20-0.9 MG/50ML-% IV SOLN
20.0000 mg | Freq: Two times a day (BID) | INTRAVENOUS | Status: DC
  Administered 2018-01-01: 20 mg via INTRAVENOUS
  Filled 2018-01-01: qty 50

## 2018-01-01 MED ORDER — DIPHENHYDRAMINE HCL 50 MG/ML IJ SOLN
50.0000 mg | Freq: Once | INTRAMUSCULAR | Status: AC
  Administered 2018-01-01: 50 mg via INTRAVENOUS
  Filled 2018-01-01: qty 1

## 2018-01-01 MED ORDER — SODIUM CHLORIDE 0.9 % IV SOLN
Freq: Once | INTRAVENOUS | Status: AC
  Administered 2018-01-01: 09:00:00 via INTRAVENOUS

## 2018-01-01 NOTE — Progress Notes (Signed)
Treatment given per orders. Patient tolerated it well without problems. Vitals stable and discharged home from clinic ambulatory. Follow up as scheduled.  

## 2018-01-02 ENCOUNTER — Telehealth (HOSPITAL_COMMUNITY): Payer: Self-pay

## 2018-01-02 NOTE — Telephone Encounter (Signed)
24 hour follow up-patient is working today. No complaints or problems at this time. He has phone numbers to call if he has questions or concerns.

## 2018-01-06 ENCOUNTER — Encounter: Payer: Self-pay | Admitting: General Practice

## 2018-01-06 NOTE — Progress Notes (Signed)
[facility] CSW Progress Notes  Call to patient after chemo education class to assess for needs.  States he has good support from family and faith community, is doing well at this time, no needs.  Encouraged to reach out to CSW as needed throughout treatment.  Advised of Support Group and [facility] as additional supports;  Edwyna Shell, LCSW Clinical Social Worker Phone:  [phone]

## 2018-01-07 ENCOUNTER — Other Ambulatory Visit (HOSPITAL_COMMUNITY): Payer: Self-pay

## 2018-01-07 DIAGNOSIS — C9110 Chronic lymphocytic leukemia of B-cell type not having achieved remission: Secondary | ICD-10-CM

## 2018-01-08 ENCOUNTER — Inpatient Hospital Stay (HOSPITAL_COMMUNITY): Payer: 59

## 2018-01-08 ENCOUNTER — Encounter (HOSPITAL_COMMUNITY): Payer: Self-pay | Admitting: Hematology

## 2018-01-08 ENCOUNTER — Inpatient Hospital Stay (HOSPITAL_BASED_OUTPATIENT_CLINIC_OR_DEPARTMENT_OTHER): Payer: 59 | Admitting: Hematology

## 2018-01-08 VITALS — BP 130/71 | HR 48 | Temp 97.9°F | Resp 16 | Wt 262.8 lb

## 2018-01-08 DIAGNOSIS — C9110 Chronic lymphocytic leukemia of B-cell type not having achieved remission: Secondary | ICD-10-CM

## 2018-01-08 DIAGNOSIS — R21 Rash and other nonspecific skin eruption: Secondary | ICD-10-CM | POA: Diagnosis not present

## 2018-01-08 DIAGNOSIS — C8300 Small cell B-cell lymphoma, unspecified site: Secondary | ICD-10-CM

## 2018-01-08 DIAGNOSIS — Z5112 Encounter for antineoplastic immunotherapy: Secondary | ICD-10-CM | POA: Diagnosis not present

## 2018-01-08 LAB — CBC WITH DIFFERENTIAL/PLATELET
BASOS ABS: 0 10*3/uL (ref 0.0–0.1)
Basophils Relative: 1 %
EOS ABS: 0.1 10*3/uL (ref 0.0–0.7)
EOS PCT: 1 %
HCT: 42.8 % (ref 39.0–52.0)
HEMOGLOBIN: 14.7 g/dL (ref 13.0–17.0)
LYMPHS ABS: 1.4 10*3/uL (ref 0.7–4.0)
LYMPHS PCT: 23 %
MCH: 32.1 pg (ref 26.0–34.0)
MCHC: 34.3 g/dL (ref 30.0–36.0)
MCV: 93.4 fL (ref 78.0–100.0)
Monocytes Absolute: 0.5 10*3/uL (ref 0.1–1.0)
Monocytes Relative: 9 %
NEUTROS PCT: 66 %
Neutro Abs: 4 10*3/uL (ref 1.7–7.7)
PLATELETS: 126 10*3/uL — AB (ref 150–400)
RBC: 4.58 MIL/uL (ref 4.22–5.81)
RDW: 13.5 % (ref 11.5–15.5)
WBC: 6 10*3/uL (ref 4.0–10.5)

## 2018-01-08 LAB — COMPREHENSIVE METABOLIC PANEL
ALK PHOS: 52 U/L (ref 38–126)
ALT: 47 U/L — AB (ref 0–44)
AST: 41 U/L (ref 15–41)
Albumin: 3.9 g/dL (ref 3.5–5.0)
Anion gap: 7 (ref 5–15)
BUN: 20 mg/dL (ref 6–20)
CALCIUM: 8.8 mg/dL — AB (ref 8.9–10.3)
CHLORIDE: 104 mmol/L (ref 98–111)
CO2: 28 mmol/L (ref 22–32)
CREATININE: 0.93 mg/dL (ref 0.61–1.24)
GFR calc Af Amer: >60 mL/min (ref >60)
GFR calc non Af Amer: >60 mL/min (ref >60)
Glucose, Bld: 103 mg/dL — ABNORMAL HIGH (ref 70–99)
Potassium: 4.1 mmol/L (ref 3.5–5.1)
SODIUM: 139 mmol/L (ref 135–145)
Total Bilirubin: 1.2 mg/dL (ref 0.3–1.2)
Total Protein: 6.3 g/dL — ABNORMAL LOW (ref 6.5–8.1)

## 2018-01-08 LAB — LACTATE DEHYDROGENASE: LDH: 158 U/L (ref 98–192)

## 2018-01-08 NOTE — Progress Notes (Signed)
Patient states he is taking the Ibrutinib as directed with no side effects noted. He states he had diarrhea x 4 on Monday but it was relieved by imodium. patient attributed the diarrhea to the rituxin he had on Thursday.

## 2018-01-08 NOTE — Assessment & Plan Note (Signed)
1.  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  No evidence of transformation. 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  - Bone marrow aspirate and flow cytometry positive for lymphoma involvement, chromosome analysis normal, CLL FISH panel normal - Plavix was discontinued after contacting his cardiologist Dr. Ellyn Hack -Ibrutinib 420 mg daily started on 12/01/2017, developed skin rash and ibrutinib was held on 12/13/2017 through 12/16/2017.  Rash improved with steroids and Ibrutinib was restarted back at the same dose on 12/16/2017.  He did not get any further rash.  No other side effects were noted. -Received first dose of rituximab with the start of cycle 2 on 01/01/2018 at 375 mg/m.  He will continue rituximab 500 mg/m cycles 3 through 7.  He has tolerated his first dose well without any reactions. -Clinically his lymphadenopathy in the neck is continuing to improve.  He had diarrhea for 1 day which resolved with Imodium.  No bleeding issues were reported.  We will see him back in 2 weeks to check his blood counts again.  After that we will switch him to monthly visits.  2.  TLS prophylaxis: -He is continuing allopurinol 300 mg daily.

## 2018-01-08 NOTE — Progress Notes (Signed)
[address] [place], [place] [address]   CLINIC:  Medical Oncology/Hematology  PCP:  Asencion Noble, [address] [place] [address] [phone]   REASON FOR VISIT:  Follow-up for SLL.  CURRENT THERAPY: Ibrutinib 420 mg daily and rituximab monthly  BRIEF ONCOLOGIC HISTORY:    Chronic lymphocytic leukemia (CLL), B-cell ([facility])   06/14/2015 Imaging    CT neck- Bulky adenopathy throughout the neck bilaterally. There also are enlarged parotid lymph nodes bilaterally. There is bilateral axillary adenopathy as well as mediastinal adenopathy. Findings are consistent with lymphoma. Biopsy recommended.      06/27/2015 Procedure    Left neck lymph node biopsy by Dr. Benjamine Mola      06/27/2015 Pathology Results    Diagnosis Lymph node for lymphoma, Left neck node for lymphoma work up - [place].  LOW GRADE.      06/27/2015 Pathology Results    Tissue-Flow Cytometry - MONOCLONAL B CELL POPULATION IDENTIFIED. The phenotypic features are consistent with small lymphocytic lymphoma/chronic lymphocytic leukemia and correlate well with the morphology in the lymph node       Lymphoma, small lymphocytic ([facility])   10/14/2017 Initial Diagnosis    Lymphoma, small lymphocytic ([facility])      12/25/2017 -  Chemotherapy    The patient had riTUXimab (RITUXAN) 900 mg in sodium chloride 0.9 % 250 mL (2.6471 mg/mL) infusion, 375 mg/m2 = 900 mg, Intravenous,  Once, 1 of 4 cycles Dose modification: 500 mg/m2 (Cycle 2, Reason: Provider Judgment) Administration: 900 mg (01/01/2018)  for chemotherapy treatment.         CANCER STAGING: Cancer Staging No matching staging information was found for the patient.   INTERVAL HISTORY:  Adam Reyes 62 y.o. male returns for follow-up of his small lymphocytic lymphoma.  He is continuing ibrutinib 420 mg daily without any major problems.  He had one episode of diarrhea when he had 4 loose stools on Monday.  He took  Imodium and did not have it afterwards.  Denies any fevers or infections.  Continues to notice decrease in lymphadenopathy in the neck region.  Continues to work full-time job.  No bleeding episodes noted.    REVIEW OF SYSTEMS:  Review of Systems  Gastrointestinal: Positive for diarrhea.  Hematological: Bruises/bleeds easily.  All other systems reviewed and are negative.    PAST MEDICAL/SURGICAL HISTORY:  Past Medical History:  Diagnosis Date  . 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 Enlarged lymph node    left neck  . STEMI (ST elevation myocardial infarction) ([facility]) 03/05/2017   hx/notes 03/05/2017 -likely aborted anterior STEMI with 50% bifurcation LAD-Diag1.  No PCI.  Preserved EF   Past Surgical History:  Procedure Laterality Date  . COLONOSCOPY N/A 10/11/2015   Procedure: COLONOSCOPY;  Surgeon: Rogene Houston, MD;  [facility];  Service: Endoscopy;  Laterality: N/A;  10/11/2015  . Graded Exercise Tolerance Test (GXT/ETT)  05/2017   10.7 METs (9: 25 min.  Reached 103% max predicted heart rate).  No EKG findings to suggest coronary ischemia.  Negative, low risk GXT  . LEFT HEART CATH AND CORONARY ANGIOGRAPHY N/A 03/05/2017   Procedure: LEFT HEART CATH AND CORONARY ANGIOGRAPHY;  Surgeon: Leonie Man, MD;  Location: [facility];  Service: Cardiovascular: pLAD-Diag1 50-55% (non-flow-limiiting).  EF ~  50-55%.  although bifurcation lesion was presumed Culprit - no PCI (not flow limiting). - Med Rx.  [name] MASS BIOPSY Left 06/27/2015   Procedure: OPEN LEFT NECK BIOPSY ;  Surgeon: Leta Baptist, MD;  Location: [facility];  Service: ENT;  Laterality: Left;  . REFRACTIVE SURGERY Bilateral      SOCIAL HISTORY:  Social History   Socioeconomic History  . Marital status: Married    Spouse name: Not on file    . Number of children: Not on file  . Years of education: Not on file  . Highest education level: Not on file  Occupational History  . Not on file  Social Needs  . Financial resource strain: Not on file  . Food insecurity:    Worry: Not on file    Inability: Not on file  . Transportation needs:    Medical: Not on file    Non-medical: Not on file  Tobacco Use  . Smoking status: Former Smoker    Years: 2.00    Types: Cigarettes  . Smokeless tobacco: Former Systems developer    Types: Chew, Snuff  . Tobacco comment: 03/06/2017 "quit smoking when I was young; quit chew/snuff in ~ 2008"  Substance and Sexual Activity  . Alcohol use: Yes    Comment: 03/06/2017 "maybe 6 pack beer/month"  . Drug use: No  . Sexual activity: Yes  Lifestyle  . Physical activity:    Days per week: Not on file    Minutes per session: Not on file  . Stress: Not on file  Relationships  . Social connections:    Talks on phone: Not on file    Gets together: Not on file    Attends religious service: Not on file    Active member of club or organization: Not on file    Attends meetings of clubs or organizations: Not on file    Relationship status: Not on file  . Intimate partner violence:    Fear of current or ex partner: Not on file    Emotionally abused: Not on file    Physically abused: Not on file    Forced sexual activity: Not on file  Other Topics Concern  . Not on file  Social History Narrative  . Not on file    FAMILY HISTORY:  Family History  Problem Relation Age of Onset  . Diabetes Mother   . Cancer Father   . Cancer Brother     CURRENT MEDICATIONS:  Outpatient Encounter Medications as of 01/08/2018  Medication Sig  . allopurinol (ZYLOPRIM) 300 MG tablet Take 1 tablet (300 mg total) by mouth daily.  [name] aspirin 81 MG chewable tablet Chew 1 tablet (81 mg total) by mouth every other day.  [name] atorvastatin (LIPITOR) 80 MG tablet Take 1 tablet (80 mg total) by mouth daily at 6 PM.  . chlorhexidine  (PERIDEX) 0.12 % solution   . Ibrutinib 420 MG TABS Take 1 capsule by mouth daily. Take same time every day with a glass of water  . metoprolol tartrate (LOPRESSOR) 25 MG tablet Take 1 tablet (25 mg total) by mouth 2 (two) times daily.  . naproxen sodium (ANAPROX) 550 MG tablet TK 1 T PO Q 12 H PRN  . nitroGLYCERIN (NITROSTAT) 0.4 MG SL tablet Place 1 tablet (0.4 mg total) under the tongue every 5 (five) minutes as needed for chest pain.  [name] prochlorperazine (COMPAZINE) 10 MG tablet Take 1 tablet (10 mg total) by mouth every 6 (six) hours  as needed for nausea or vomiting.  . vitamin C (ASCORBIC ACID) 500 MG tablet Take 500 mg by mouth daily.   No facility-administered encounter medications on file as of 01/08/2018.     ALLERGIES:  No Known Allergies   PHYSICAL EXAM:  ECOG Performance status: 0  Vitals:   01/08/18 0838  BP: 130/71  Pulse: (!) 48  Resp: 16  Temp: 97.9 F (36.6 C)  SpO2: 98%   Filed Weights   01/08/18 0838  Weight: 262 lb 12.8 oz (119.2 kg)    Physical Exam HEENT: Excellent improvement in the neck lymphadenopathy with very small lymph nodes remaining. Lymphadenopathy: No palpable axillary or inguinal adenopathy. Abdomen: No palpable splenomegaly. Chest: Bilaterally clear to auscultation. CVS: S1-S2 regular rate and rhythm. Extremities: No edema or cyanosis.  LABORATORY DATA:  I have reviewed the labs as listed.  CBC    Component Value Date/Time   WBC 6.0 01/08/2018 0816   RBC 4.58 01/08/2018 0816   HGB 14.7 01/08/2018 0816   HCT 42.8 01/08/2018 0816   PLT 126 (L) 01/08/2018 0816   MCV 93.4 01/08/2018 0816   MCH 32.1 01/08/2018 0816   MCHC 34.3 01/08/2018 0816   RDW 13.5 01/08/2018 0816   LYMPHSABS 1.4 01/08/2018 0816   MONOABS 0.5 01/08/2018 0816   EOSABS 0.1 01/08/2018 0816   BASOSABS 0.0 01/08/2018 0816   CMP Latest Ref Rng & Units 01/08/2018 01/01/2018 12/22/2017  Glucose 70 - 99 mg/dL 103(H) 110(H) 96  BUN 6 - 20 mg/dL 20 21(H) 26(H)    Creatinine 0.61 - 1.24 mg/dL 0.93 0.87 0.84  Sodium 135 - 145 mmol/L 139 138 140  Potassium 3.5 - 5.1 mmol/L 4.1 3.6 3.9  Chloride 98 - 111 mmol/L 104 104 104  CO2 22 - 32 mmol/L 28 27 29   Calcium 8.9 - 10.3 mg/dL 8.8(L) 9.0 9.1  Total Protein 6.5 - 8.1 g/dL 6.3(L) 6.6 6.4(L)  Total Bilirubin 0.3 - 1.2 mg/dL 1.2 1.6(H) 1.1  Alkaline Phos 38 - 126 U/L 52 60 60  AST 15 - 41 U/L 41 44(H) 28  ALT 0 - 44 U/L 47(H) 51 45         ASSESSMENT & PLAN:   Lymphoma, small lymphocytic (HCC) 1.  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  No evidence of transformation. 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  - Bone marrow aspirate and flow cytometry positive for lymphoma involvement, chromosome analysis normal, CLL FISH panel normal - Plavix was discontinued after contacting his cardiologist Dr. Ellyn Hack -Ibrutinib 420 mg daily started on 12/01/2017, developed skin rash and ibrutinib was held on 12/13/2017 through 12/16/2017.  Rash improved with steroids and Ibrutinib was restarted back at the same dose on 12/16/2017.  He did not get any further rash.  No other side effects were noted. -Received first dose of rituximab with the start of cycle 2 on 01/01/2018 at 375 mg/m.  He will continue rituximab 500 mg/m cycles 3 through 7.  He has tolerated his first dose well without any reactions. -Clinically his lymphadenopathy in the neck is continuing to improve.  He had diarrhea for 1 day which resolved with Imodium.  No bleeding issues were reported.  We will see him back in 2 weeks to check his blood counts again.  After that we will switch him to monthly visits.  2.  TLS prophylaxis: -He is continuing allopurinol 300 mg daily.      Orders placed this encounter:  Orders Placed This Encounter   Procedures  . CBC with Differential/Platelet  . Comprehensive metabolic panel  . Lactate dehydrogenase      Derek Jack, MD [place] [phone]

## 2018-01-08 NOTE — Patient Instructions (Signed)
[facility] at [facility] Discharge Instructions  You saw Dr. Katragadda today.   Thank you for choosing [facility] at [facility] to provide your oncology and hematology care.  To afford each patient quality time with our provider, please arrive at least 15 minutes before your scheduled appointment time.   If you have a lab appointment with the Cancer Center please come in thru the  Main Entrance and check in at the main information desk  You need to re-schedule your appointment should you arrive 10 or more minutes late.  We strive to give you quality time with our providers, and arriving late affects you and other patients whose appointments are after yours.  Also, if you no show three or more times for appointments you may be dismissed from the clinic at the providers discretion.     Again, thank you for choosing [facility].  Our hope is that these requests will decrease the amount of time that you wait before being seen by our physicians.       _____________________________________________________________  Should you have questions after your visit to [facility], please contact our office at [phone] between the hours of 8:30 a.m. and 4:30 p.m.  Voicemails left after 4:30 p.m. will not be returned until the following business day.  For prescription refill requests, have your pharmacy contact our office.       Resources For Cancer Patients and their Caregivers ? American Cancer Society: Can assist with transportation, wigs, general needs, runs Look Good Feel Better.        1-888-227-6333 ? Cancer Care: Provides financial assistance, online support groups, medication/co-pay assistance.  1-800-813-HOPE (4673) ? [facility] Assists [place] cancer patients and their families through emotional , educational and financial support.  336-427-4357 ? [place] DSS Where to apply for  food stamps, Medicaid and utility assistance. 336-342-1394 ? [facility]: Transportation to medical appointments. 336-347-2287 ? Social Security Administration: May apply for disability if have a Stage IV cancer. 336-342-7796 1-800-772-1213 ? [place] Aging, Disability and Transit Services: Assists with nutrition, care and transit needs. 336-349-2343  Cancer Center Support Programs:   > Cancer Support Group  2nd Tuesday of the month 1pm-2pm, Journey Room   > Creative Journey  3rd Tuesday of the month 1130am-1pm, Journey Room     

## 2018-01-16 ENCOUNTER — Other Ambulatory Visit (HOSPITAL_COMMUNITY): Payer: Self-pay | Admitting: *Deleted

## 2018-01-16 DIAGNOSIS — C8300 Small cell B-cell lymphoma, unspecified site: Secondary | ICD-10-CM

## 2018-01-16 NOTE — Progress Notes (Signed)
Patient called into clinic today stating that he noticed some blood in his stool.  He states that it was on the paper and bright red blood in the commode water.  Occult stool cards ordered for patient.  Correct collection explained to patient. Patient verbalizes understanding.

## 2018-01-19 ENCOUNTER — Other Ambulatory Visit (HOSPITAL_COMMUNITY): Payer: Self-pay | Admitting: Hematology

## 2018-01-19 DIAGNOSIS — C8300 Small cell B-cell lymphoma, unspecified site: Secondary | ICD-10-CM

## 2018-01-19 LAB — OCCULT BLOOD X 1 CARD TO LAB, STOOL
FECAL OCCULT BLD: NEGATIVE
Fecal Occult Bld: NEGATIVE
Fecal Occult Bld: NEGATIVE

## 2018-01-20 ENCOUNTER — Other Ambulatory Visit (HOSPITAL_COMMUNITY): Payer: Self-pay | Admitting: Hematology

## 2018-01-20 DIAGNOSIS — C8300 Small cell B-cell lymphoma, unspecified site: Secondary | ICD-10-CM

## 2018-01-20 DIAGNOSIS — C9110 Chronic lymphocytic leukemia of B-cell type not having achieved remission: Secondary | ICD-10-CM

## 2018-01-20 MED ORDER — IBRUTINIB 420 MG PO TABS: 1.0000 | ORAL_TABLET | Freq: Every day | ORAL | 1 refills | Status: DC

## 2018-01-20 NOTE — Telephone Encounter (Signed)
Chart reviewed, per Dr. Tomie China last note, ibrutinib refilled.

## 2018-01-21 ENCOUNTER — Other Ambulatory Visit (HOSPITAL_COMMUNITY): Payer: Self-pay | Admitting: *Deleted

## 2018-01-22 ENCOUNTER — Telehealth (HOSPITAL_COMMUNITY): Payer: Self-pay | Admitting: *Deleted

## 2018-01-22 NOTE — Telephone Encounter (Signed)
Chart reviewed, per Dr. Tomie China last office note, patient is to continue taking allopurinol at this time.  I returned call to patient and advised him of this.  He verbalizes understanding.

## 2018-01-23 ENCOUNTER — Other Ambulatory Visit (HOSPITAL_COMMUNITY): Payer: Self-pay | Admitting: *Deleted

## 2018-01-23 DIAGNOSIS — C8300 Small cell B-cell lymphoma, unspecified site: Secondary | ICD-10-CM

## 2018-01-23 MED ORDER — IBRUTINIB 420 MG PO TABS: 1.0000 | ORAL_TABLET | Freq: Every day | ORAL | 3 refills | Status: DC

## 2018-01-26 ENCOUNTER — Inpatient Hospital Stay (HOSPITAL_COMMUNITY): Payer: 59 | Attending: Hematology

## 2018-01-26 DIAGNOSIS — Z5112 Encounter for antineoplastic immunotherapy: Secondary | ICD-10-CM | POA: Diagnosis not present

## 2018-01-26 DIAGNOSIS — C8300 Small cell B-cell lymphoma, unspecified site: Secondary | ICD-10-CM

## 2018-01-26 DIAGNOSIS — C9110 Chronic lymphocytic leukemia of B-cell type not having achieved remission: Secondary | ICD-10-CM | POA: Diagnosis present

## 2018-01-26 LAB — CBC WITH DIFFERENTIAL/PLATELET
BASOS ABS: 0 10*3/uL (ref 0.0–0.1)
Basophils Relative: 0 %
EOS ABS: 0.1 10*3/uL (ref 0.0–0.7)
EOS PCT: 1 %
HCT: 42.3 % (ref 39.0–52.0)
Hemoglobin: 14.4 g/dL (ref 13.0–17.0)
Lymphocytes Relative: 15 %
Lymphs Abs: 1.1 10*3/uL (ref 0.7–4.0)
MCH: 31.9 pg (ref 26.0–34.0)
MCHC: 34 g/dL (ref 30.0–36.0)
MCV: 93.6 fL (ref 78.0–100.0)
Monocytes Absolute: 0.8 10*3/uL (ref 0.1–1.0)
Monocytes Relative: 11 %
Neutro Abs: 5.3 10*3/uL (ref 1.7–7.7)
Neutrophils Relative %: 73 %
PLATELETS: 122 10*3/uL — AB (ref 150–400)
RBC: 4.52 MIL/uL (ref 4.22–5.81)
RDW: 13.7 % (ref 11.5–15.5)
WBC: 7.3 10*3/uL (ref 4.0–10.5)

## 2018-01-26 LAB — COMPREHENSIVE METABOLIC PANEL
ALT: 25 U/L (ref 0–44)
AST: 26 U/L (ref 15–41)
Albumin: 4.1 g/dL (ref 3.5–5.0)
Alkaline Phosphatase: 59 U/L (ref 38–126)
Anion gap: 5 (ref 5–15)
BUN: 20 mg/dL (ref 6–20)
CO2: 28 mmol/L (ref 22–32)
CREATININE: 0.83 mg/dL (ref 0.61–1.24)
Calcium: 8.8 mg/dL — ABNORMAL LOW (ref 8.9–10.3)
Chloride: 106 mmol/L (ref 98–111)
GFR calc non Af Amer: >60 mL/min (ref >60)
Glucose, Bld: 75 mg/dL (ref 70–99)
Potassium: 3.5 mmol/L (ref 3.5–5.1)
SODIUM: 139 mmol/L (ref 135–145)
Total Bilirubin: 1.3 mg/dL — ABNORMAL HIGH (ref 0.3–1.2)
Total Protein: 6.7 g/dL (ref 6.5–8.1)

## 2018-01-26 LAB — LACTATE DEHYDROGENASE: LDH: 158 U/L (ref 98–192)

## 2018-01-29 ENCOUNTER — Encounter (HOSPITAL_COMMUNITY): Payer: Self-pay | Admitting: Hematology

## 2018-01-29 ENCOUNTER — Inpatient Hospital Stay (HOSPITAL_BASED_OUTPATIENT_CLINIC_OR_DEPARTMENT_OTHER): Payer: 59 | Admitting: Hematology

## 2018-01-29 ENCOUNTER — Other Ambulatory Visit: Payer: Self-pay

## 2018-01-29 ENCOUNTER — Inpatient Hospital Stay (HOSPITAL_COMMUNITY): Payer: 59

## 2018-01-29 VITALS — BP 140/74 | HR 55 | Temp 97.7°F | Resp 18 | Wt 268.0 lb

## 2018-01-29 VITALS — BP 123/80 | HR 50 | Temp 97.7°F | Resp 20

## 2018-01-29 DIAGNOSIS — C8300 Small cell B-cell lymphoma, unspecified site: Secondary | ICD-10-CM

## 2018-01-29 DIAGNOSIS — C9110 Chronic lymphocytic leukemia of B-cell type not having achieved remission: Secondary | ICD-10-CM

## 2018-01-29 DIAGNOSIS — Z5112 Encounter for antineoplastic immunotherapy: Secondary | ICD-10-CM | POA: Diagnosis not present

## 2018-01-29 MED ORDER — SODIUM CHLORIDE 0.9 % IV SOLN
Freq: Once | INTRAVENOUS | Status: AC
  Administered 2018-01-29: 500 mL via INTRAVENOUS

## 2018-01-29 MED ORDER — FAMOTIDINE IN NACL 20-0.9 MG/50ML-% IV SOLN
20.0000 mg | Freq: Two times a day (BID) | INTRAVENOUS | Status: DC
  Administered 2018-01-29: 20 mg via INTRAVENOUS

## 2018-01-29 MED ORDER — DIPHENHYDRAMINE HCL 50 MG/ML IJ SOLN
50.0000 mg | Freq: Once | INTRAMUSCULAR | Status: AC
  Administered 2018-01-29: 50 mg via INTRAVENOUS

## 2018-01-29 MED ORDER — CEPHALEXIN 500 MG PO CAPS: 500.0000 mg | ORAL_CAPSULE | Freq: Two times a day (BID) | ORAL | 0 refills | Status: AC

## 2018-01-29 MED ORDER — ACETAMINOPHEN 325 MG PO TABS
650.0000 mg | ORAL_TABLET | Freq: Once | ORAL | Status: AC
  Administered 2018-01-29: 650 mg via ORAL

## 2018-01-29 MED ORDER — ACETAMINOPHEN 325 MG PO TABS
ORAL_TABLET | ORAL | Status: AC
  Filled 2018-01-29: qty 2

## 2018-01-29 MED ORDER — SODIUM CHLORIDE 0.9 % IV SOLN
500.0000 mg/m2 | Freq: Once | INTRAVENOUS | Status: AC
  Administered 2018-01-29: 1300 mg via INTRAVENOUS
  Filled 2018-01-29: qty 100

## 2018-01-29 MED ORDER — SODIUM CHLORIDE 0.9% FLUSH
10.0000 mL | INTRAVENOUS | Status: DC | PRN
  Administered 2018-01-29: 10 mL
  Filled 2018-01-29: qty 10

## 2018-01-29 MED ORDER — FAMOTIDINE IN NACL 20-0.9 MG/50ML-% IV SOLN
INTRAVENOUS | Status: AC
  Filled 2018-01-29: qty 50

## 2018-01-29 MED ORDER — DIPHENHYDRAMINE HCL 50 MG/ML IJ SOLN
INTRAMUSCULAR | Status: AC
  Filled 2018-01-29: qty 1

## 2018-01-29 NOTE — Progress Notes (Signed)
[place], [place] [address]   CLINIC:  Medical Oncology/Hematology  PCP:  Asencion Noble, MD [address] [place] [address] [phone]   REASON FOR VISIT:  Follow-up for Chronic Lymphocytic Leukemia (CLL)  CURRENT THERAPY: Ibrutinib 420mg  daily  and rituximab once a month  BRIEF ONCOLOGIC HISTORY:    Chronic lymphocytic leukemia (CLL), B-cell ([facility])   06/14/2015 Imaging    CT neck- Bulky adenopathy throughout the neck bilaterally. There also are enlarged parotid lymph nodes bilaterally. There is bilateral axillary adenopathy as well as mediastinal adenopathy. Findings are consistent with lymphoma. Biopsy recommended.      06/27/2015 Procedure    Left neck lymph node biopsy by Dr. Benjamine Mola      06/27/2015 Pathology Results    Diagnosis Lymph node for lymphoma, Left neck node for lymphoma work up - [place].  LOW GRADE.      06/27/2015 Pathology Results    Tissue-Flow Cytometry - MONOCLONAL B CELL POPULATION IDENTIFIED. The phenotypic features are consistent with small lymphocytic lymphoma/chronic lymphocytic leukemia and correlate well with the morphology in the lymph node       Lymphoma, small lymphocytic ([facility])   10/14/2017 Initial Diagnosis    Lymphoma, small lymphocytic ([facility])      12/25/2017 -  Chemotherapy    The patient had riTUXimab (RITUXAN) 900 mg in sodium chloride 0.9 % 250 mL (2.6471 mg/mL) infusion, 375 mg/m2 = 900 mg, Intravenous,  Once, 2 of 4 cycles Dose modification: 500 mg/m2 (original dose 500 mg/m2, Cycle 2, Reason: Provider Judgment) Administration: 900 mg (01/01/2018)  for chemotherapy treatment.         INTERVAL HISTORY:  Mr. Schmieg 62 y.o. male returns for routine follow-up for chronic lymphocytic leukemia and consideration for his next cycle of chemotherapy. Patient is here today with his wife. He is doing well with chemo treatments. He had diarrhea a few days after his last chemo  treatment. He took imodium and it ws gone by the next day. He does have an cyst on his left mid back. Wife expressed some yellow discharge from it however it remains red and tender. Denies any new pains. Denies any fever or infection recently. Denies nausea or vomiting. His appetite and energy levels remain high at 100%.overall he feels ready for chemo today.    REVIEW OF SYSTEMS:  Review of Systems  All other systems reviewed and are negative.    PAST MEDICAL/SURGICAL HISTORY:  Past Medical History:  Diagnosis Date  . 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 Enlarged lymph node    left neck  . STEMI (ST elevation myocardial infarction) ([facility]) 03/05/2017   hx/notes 03/05/2017 -likely aborted anterior STEMI with 50% bifurcation LAD-Diag1.  No PCI.  Preserved EF   Past Surgical History:  Procedure Laterality Date  . COLONOSCOPY N/A 10/11/2015   Procedure: COLONOSCOPY;  Surgeon: Rogene Houston, MD;  [facility];  Service: Endoscopy;  Laterality: N/A;  10/11/2015  . Graded Exercise Tolerance Test (GXT/ETT)  05/2017   10.7 METs (9: 25 min.  Reached 103% max predicted heart rate).  No EKG findings to suggest coronary ischemia.  Negative, low risk GXT  . LEFT HEART CATH AND CORONARY ANGIOGRAPHY N/A 03/05/2017   Procedure: LEFT HEART CATH AND CORONARY ANGIOGRAPHY;  Surgeon: Glenetta Hew  W, MD;  Location: [facility];  Service: Cardiovascular: pLAD-Diag1 50-55% (non-flow-limiiting).  EF ~50-55%.  although bifurcation lesion was presumed Culprit - no PCI (not flow limiting). - Med Rx.  [name] MASS BIOPSY Left 06/27/2015   Procedure: OPEN LEFT NECK BIOPSY ;  Surgeon: Leta Baptist, MD;  Location: [facility];  Service: ENT;  Laterality: Left;  . REFRACTIVE SURGERY Bilateral      SOCIAL HISTORY:  Social History    Socioeconomic History  . Marital status: Married    Spouse name: Not on file  . Number of children: Not on file  . Years of education: Not on file  . Highest education level: Not on file  Occupational History  . Not on file  Social Needs  . Financial resource strain: Not on file  . Food insecurity:    Worry: Not on file    Inability: Not on file  . Transportation needs:    Medical: Not on file    Non-medical: Not on file  Tobacco Use  . Smoking status: Former Smoker    Years: 2.00    Types: Cigarettes  . Smokeless tobacco: Former Systems developer    Types: Chew, Snuff  . Tobacco comment: 03/06/2017 "quit smoking when I was young; quit chew/snuff in ~ 2008"  Substance and Sexual Activity  . Alcohol use: Yes    Comment: 03/06/2017 "maybe 6 pack beer/month"  . Drug use: No  . Sexual activity: Yes  Lifestyle  . Physical activity:    Days per week: Not on file    Minutes per session: Not on file  . Stress: Not on file  Relationships  . Social connections:    Talks on phone: Not on file    Gets together: Not on file    Attends religious service: Not on file    Active member of club or organization: Not on file    Attends meetings of clubs or organizations: Not on file    Relationship status: Not on file  . Intimate partner violence:    Fear of current or ex partner: Not on file    Emotionally abused: Not on file    Physically abused: Not on file    Forced sexual activity: Not on file  Other Topics Concern  . Not on file  Social History Narrative  . Not on file    FAMILY HISTORY:  Family History  Problem Relation Age of Onset  . Diabetes Mother   . Cancer Father   . Cancer Brother     CURRENT MEDICATIONS:  Outpatient Encounter Medications as of 01/29/2018  Medication Sig  . allopurinol (ZYLOPRIM) 300 MG tablet TAKE 1 TABLET(300 MG) BY MOUTH DAILY  . aspirin 81 MG chewable tablet Chew 1 tablet (81 mg total) by mouth every other day.  [name] atorvastatin (LIPITOR) 80 MG tablet  Take 1 tablet (80 mg total) by mouth daily at 6 PM.  . chlorhexidine (PERIDEX) 0.12 % solution   . Ibrutinib 420 MG TABS Take 1 capsule by mouth daily. Take same time every day with a glass of water  . metoprolol tartrate (LOPRESSOR) 25 MG tablet Take 1 tablet (25 mg total) by mouth 2 (two) times daily.  . naproxen sodium (ANAPROX) 550 MG tablet TK 1 T PO Q 12 H PRN  . nitroGLYCERIN (NITROSTAT) 0.4 MG SL tablet Place 1 tablet (0.4 mg total) under the tongue every 5 (five) minutes as needed for chest pain.  [name] prochlorperazine (COMPAZINE) 10 MG  tablet Take 1 tablet (10 mg total) by mouth every 6 (six) hours as needed for nausea or vomiting.  . cephALEXin (KEFLEX) 500 MG capsule Take 1 capsule (500 mg total) by mouth 2 (two) times daily for 7 days.  . [DISCONTINUED] vitamin C (ASCORBIC ACID) 500 MG tablet Take 500 mg by mouth daily.   No facility-administered encounter medications on file as of 01/29/2018.     ALLERGIES:  No Known Allergies   PHYSICAL EXAM:  ECOG Performance status: 1  Vitals:   01/29/18 0852  BP: 140/74  Pulse: (!) 55  Resp: 18  Temp: 97.7 F (36.5 C)  SpO2: 98%   Filed Weights   01/29/18 0852  Weight: 268 lb (121.6 kg)    Physical Exam There is a sebaceous cyst on his upper back which has slight erythema and tenderness. Lymphadenopathy: No palpable neck axillary or inguinal adenopathy. Abdomen: No palpable hepatosplenomegaly or other masses.  LABORATORY DATA:  I have reviewed the labs as listed.  CBC    Component Value Date/Time   WBC 7.3 01/26/2018 1530   RBC 4.52 01/26/2018 1530   HGB 14.4 01/26/2018 1530   HCT 42.3 01/26/2018 1530   PLT 122 (L) 01/26/2018 1530   MCV 93.6 01/26/2018 1530   MCH 31.9 01/26/2018 1530   MCHC 34.0 01/26/2018 1530   RDW 13.7 01/26/2018 1530   LYMPHSABS 1.1 01/26/2018 1530   MONOABS 0.8 01/26/2018 1530   EOSABS 0.1 01/26/2018 1530   BASOSABS 0.0 01/26/2018 1530   CMP Latest Ref Rng & Units 01/26/2018 01/08/2018  01/01/2018  Glucose 70 - 99 mg/dL 75 103(H) 110(H)  BUN 6 - 20 mg/dL 20 20 21(H)  Creatinine 0.61 - 1.24 mg/dL 0.83 0.93 0.87  Sodium 135 - 145 mmol/L 139 139 138  Potassium 3.5 - 5.1 mmol/L 3.5 4.1 3.6  Chloride 98 - 111 mmol/L 106 104 104  CO2 22 - 32 mmol/L 28 28 27   Calcium 8.9 - 10.3 mg/dL 8.8(L) 8.8(L) 9.0  Total Protein 6.5 - 8.1 g/dL 6.7 6.3(L) 6.6  Total Bilirubin 0.3 - 1.2 mg/dL 1.3(H) 1.2 1.6(H)  Alkaline Phos 38 - 126 U/L 59 52 60  AST 15 - 41 U/L 26 41 44(H)  ALT 0 - 44 U/L 25 47(H) 51       ASSESSMENT & PLAN:   Lymphoma, small lymphocytic (HCC) 1.  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  No evidence of transformation. 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  - Bone marrow aspirate and flow cytometry positive for lymphoma involvement, chromosome analysis normal, CLL FISH panel normal - Plavix was discontinued after contacting his cardiologist Dr. Ellyn Hack -Ibrutinib 420 mg daily started on 12/01/2017, developed skin rash and ibrutinib was held on 12/13/2017 through 12/16/2017.  Rash improved with steroids and Ibrutinib was restarted back at the same dose on 12/16/2017.  He did not get any further rash.  No other side effects were noted. -Received first dose of rituximab with the start of cycle 2 on 01/01/2018 at 375 mg/m.  He will continue rituximab 500 mg/m cycles 3 through 7.  He has tolerated his first dose well without any reactions. - He had excellent response with resolution of lymphadenopathy.  No palpable hepatosplenomegaly today.  He will proceed with rituximab cycle 2  today at 500 mg/m.  We will reevaluate him in 4 weeks for his next rituximab dose. - He has a slight inflammation with redness and tenderness over the sebaceous cyst area on his upper back.  We  will give him Keflex 500 mg twice daily for 7 days.  2.  TLS prophylaxis: -He is continuing allopurinol 300 mg daily.  He will finish the current bottle and will stop taking it.      Orders placed this encounter:  Orders Placed This Encounter  Procedures  . CBC with Differential/Platelet  . Comprehensive metabolic panel  . Lactate dehydrogenase  . Uric acid      Derek Jack, MD [place] [phone]

## 2018-01-29 NOTE — Assessment & Plan Note (Signed)
1.  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  No evidence of transformation. 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  - Bone marrow aspirate and flow cytometry positive for lymphoma involvement, chromosome analysis normal, CLL FISH panel normal - Plavix was discontinued after contacting his cardiologist Dr. Ellyn Hack -Ibrutinib 420 mg daily started on 12/01/2017, developed skin rash and ibrutinib was held on 12/13/2017 through 12/16/2017.  Rash improved with steroids and Ibrutinib was restarted back at the same dose on 12/16/2017.  He did not get any further rash.  No other side effects were noted. -Received first dose of rituximab with the start of cycle 2 on 01/01/2018 at 375 mg/m.  He will continue rituximab 500 mg/m cycles 3 through 7.  He has tolerated his first dose well without any reactions. - He had excellent response with resolution of lymphadenopathy.  No palpable hepatosplenomegaly today.  He will proceed with rituximab cycle 2 today at 500 mg/m.  We will reevaluate him in 4 weeks for his next rituximab dose. - He has a slight inflammation with redness and tenderness over the sebaceous cyst area on his upper back.  We will give him Keflex 500 mg twice daily for 7 days.  2.  TLS prophylaxis: -He is continuing allopurinol 300 mg daily.  He will finish the current bottle and will stop taking it.

## 2018-01-29 NOTE — Patient Instructions (Signed)
[facility] Discharge Instructions for Patients Receiving Chemotherapy  Today you received the following chemotherapy agents rituxan.    If you develop nausea and vomiting that is not controlled by your nausea medication, call the clinic.   BELOW ARE SYMPTOMS THAT SHOULD BE REPORTED IMMEDIATELY:  *FEVER GREATER THAN 100.5 F  *CHILLS WITH OR WITHOUT FEVER  NAUSEA AND VOMITING THAT IS NOT CONTROLLED WITH YOUR NAUSEA MEDICATION  *UNUSUAL SHORTNESS OF BREATH  *UNUSUAL BRUISING OR BLEEDING  TENDERNESS IN MOUTH AND THROAT WITH OR WITHOUT PRESENCE OF ULCERS  *URINARY PROBLEMS  *BOWEL PROBLEMS  UNUSUAL RASH Items with * indicate a potential emergency and should be followed up as soon as possible.  Feel free to call the clinic should you have any questions or concerns. The clinic phone number is [phone].  Please show the [facility] at check-in to the Emergency Department and triage nurse.

## 2018-01-29 NOTE — Progress Notes (Signed)
Patient seen by Dr. Delton Coombes for follow up oncology visit with labs.  Ok to treat today verbal order Dr. Delton Coombes.   Patient tolerated treatment with no complaints voiced.  Peripheral IV site clean and dry with no bruising or swelling noted at site.  Good blood return noted before and after administration of therapy.  Band aid applied.  VSS with discharge and left ambulatory with no s/s of distress noted.

## 2018-01-29 NOTE — Patient Instructions (Signed)
[facility] at [facility] Discharge Instructions  Today you saw Dr. K.   Thank you for choosing [facility] at [facility] to provide your oncology and hematology care.  To afford each patient quality time with our provider, please arrive at least 15 minutes before your scheduled appointment time.   If you have a lab appointment with the Cancer Center please come in thru the  Main Entrance and check in at the main information desk  You need to re-schedule your appointment should you arrive 10 or more minutes late.  We strive to give you quality time with our providers, and arriving late affects you and other patients whose appointments are after yours.  Also, if you no show three or more times for appointments you may be dismissed from the clinic at the providers discretion.     Again, thank you for choosing [facility].  Our hope is that these requests will decrease the amount of time that you wait before being seen by our physicians.       _____________________________________________________________  Should you have questions after your visit to [facility], please contact our office at [phone] between the hours of 8:30 a.m. and 4:30 p.m.  Voicemails left after 4:30 p.m. will not be returned until the following business day.  For prescription refill requests, have your pharmacy contact our office.       Resources For Cancer Patients and their Caregivers ? American Cancer Society: Can assist with transportation, wigs, general needs, runs Look Good Feel Better.        1-888-227-6333 ? Cancer Care: Provides financial assistance, online support groups, medication/co-pay assistance.  1-800-813-HOPE (4673) ? [facility] Assists [place] cancer patients and their families through emotional , educational and financial support.  336-427-4357 ? [place] DSS Where to apply for food  stamps, Medicaid and utility assistance. 336-342-1394 ? [facility]: Transportation to medical appointments. 336-347-2287 ? Social Security Administration: May apply for disability if have a Stage IV cancer. 336-342-7796 1-800-772-1213 ? [place] Aging, Disability and Transit Services: Assists with nutrition, care and transit needs. 336-349-2343  Cancer Center Support Programs:   > Cancer Support Group  2nd Tuesday of the month 1pm-2pm, Journey Room   > Creative Journey  3rd Tuesday of the month 1130am-1pm, Journey Room    

## 2018-02-19 ENCOUNTER — Other Ambulatory Visit (HOSPITAL_COMMUNITY): Payer: Self-pay | Admitting: Hematology

## 2018-02-19 DIAGNOSIS — C9110 Chronic lymphocytic leukemia of B-cell type not having achieved remission: Secondary | ICD-10-CM

## 2018-02-23 ENCOUNTER — Other Ambulatory Visit (HOSPITAL_COMMUNITY): Payer: Self-pay | Admitting: *Deleted

## 2018-02-23 DIAGNOSIS — C9110 Chronic lymphocytic leukemia of B-cell type not having achieved remission: Secondary | ICD-10-CM

## 2018-02-23 MED ORDER — ALLOPURINOL 300 MG PO TABS: ORAL_TABLET | ORAL | 3 refills | Status: DC

## 2018-02-26 ENCOUNTER — Inpatient Hospital Stay (HOSPITAL_COMMUNITY): Payer: 59 | Attending: Hematology | Admitting: Hematology

## 2018-02-26 ENCOUNTER — Inpatient Hospital Stay (HOSPITAL_COMMUNITY): Payer: 59

## 2018-02-26 ENCOUNTER — Encounter (HOSPITAL_COMMUNITY): Payer: Self-pay

## 2018-02-26 VITALS — BP 130/76 | HR 56 | Temp 97.4°F | Resp 18 | Wt 266.0 lb

## 2018-02-26 DIAGNOSIS — C9110 Chronic lymphocytic leukemia of B-cell type not having achieved remission: Secondary | ICD-10-CM | POA: Diagnosis present

## 2018-02-26 DIAGNOSIS — C8300 Small cell B-cell lymphoma, unspecified site: Secondary | ICD-10-CM

## 2018-02-26 DIAGNOSIS — Z5112 Encounter for antineoplastic immunotherapy: Secondary | ICD-10-CM | POA: Insufficient documentation

## 2018-02-26 LAB — CBC WITH DIFFERENTIAL/PLATELET
Basophils Absolute: 0 10*3/uL (ref 0.0–0.1)
Basophils Relative: 1 %
EOS PCT: 2 %
Eosinophils Absolute: 0.1 10*3/uL (ref 0.0–0.7)
HCT: 43 % (ref 39.0–52.0)
Hemoglobin: 14.4 g/dL (ref 13.0–17.0)
LYMPHS ABS: 1 10*3/uL (ref 0.7–4.0)
LYMPHS PCT: 16 %
MCH: 31.1 pg (ref 26.0–34.0)
MCHC: 33.5 g/dL (ref 30.0–36.0)
MCV: 92.9 fL (ref 78.0–100.0)
Monocytes Absolute: 0.5 10*3/uL (ref 0.1–1.0)
Monocytes Relative: 7 %
Neutro Abs: 4.8 10*3/uL (ref 1.7–7.7)
Neutrophils Relative %: 74 %
PLATELETS: 130 10*3/uL — AB (ref 150–400)
RBC: 4.63 MIL/uL (ref 4.22–5.81)
RDW: 13.4 % (ref 11.5–15.5)
WBC: 6.5 10*3/uL (ref 4.0–10.5)

## 2018-02-26 LAB — COMPREHENSIVE METABOLIC PANEL
ALT: 30 U/L (ref 0–44)
AST: 32 U/L (ref 15–41)
Albumin: 4 g/dL (ref 3.5–5.0)
Alkaline Phosphatase: 60 U/L (ref 38–126)
Anion gap: 9 (ref 5–15)
BUN: 25 mg/dL — ABNORMAL HIGH (ref 6–20)
CALCIUM: 8.9 mg/dL (ref 8.9–10.3)
CHLORIDE: 104 mmol/L (ref 98–111)
CO2: 25 mmol/L (ref 22–32)
Creatinine, Ser: 0.92 mg/dL (ref 0.61–1.24)
Glucose, Bld: 124 mg/dL — ABNORMAL HIGH (ref 70–99)
Potassium: 3.7 mmol/L (ref 3.5–5.1)
Sodium: 138 mmol/L (ref 135–145)
Total Bilirubin: 1.6 mg/dL — ABNORMAL HIGH (ref 0.3–1.2)
Total Protein: 6.6 g/dL (ref 6.5–8.1)

## 2018-02-26 LAB — URIC ACID: Uric Acid, Serum: 5.5 mg/dL (ref 3.7–8.6)

## 2018-02-26 LAB — LACTATE DEHYDROGENASE: LDH: 156 U/L (ref 98–192)

## 2018-02-26 MED ORDER — SODIUM CHLORIDE 0.9 % IV SOLN
500.0000 mg/m2 | Freq: Once | INTRAVENOUS | Status: AC
  Administered 2018-02-26: 1300 mg via INTRAVENOUS
  Filled 2018-02-26: qty 130

## 2018-02-26 MED ORDER — DIPHENHYDRAMINE HCL 50 MG/ML IJ SOLN
50.0000 mg | Freq: Once | INTRAMUSCULAR | Status: AC
  Administered 2018-02-26: 50 mg via INTRAVENOUS

## 2018-02-26 MED ORDER — ACETAMINOPHEN 325 MG PO TABS
650.0000 mg | ORAL_TABLET | Freq: Once | ORAL | Status: AC
  Administered 2018-02-26: 650 mg via ORAL

## 2018-02-26 MED ORDER — FAMOTIDINE IN NACL 20-0.9 MG/50ML-% IV SOLN
20.0000 mg | Freq: Two times a day (BID) | INTRAVENOUS | Status: DC
  Administered 2018-02-26: 20 mg via INTRAVENOUS

## 2018-02-26 MED ORDER — FAMOTIDINE IN NACL 20-0.9 MG/50ML-% IV SOLN
INTRAVENOUS | Status: AC
  Filled 2018-02-26: qty 50

## 2018-02-26 MED ORDER — DIPHENHYDRAMINE HCL 50 MG/ML IJ SOLN
INTRAMUSCULAR | Status: AC
  Filled 2018-02-26: qty 1

## 2018-02-26 MED ORDER — ACETAMINOPHEN 325 MG PO TABS
ORAL_TABLET | ORAL | Status: AC
  Filled 2018-02-26: qty 2

## 2018-02-26 MED ORDER — SODIUM CHLORIDE 0.9 % IV SOLN
Freq: Once | INTRAVENOUS | Status: AC
  Administered 2018-02-26: 09:00:00 via INTRAVENOUS

## 2018-02-26 MED ORDER — SODIUM CHLORIDE 0.9% FLUSH
3.0000 mL | INTRAVENOUS | Status: DC | PRN
  Administered 2018-02-26: 3 mL
  Filled 2018-02-26: qty 10

## 2018-02-26 NOTE — Patient Instructions (Signed)
[facility] at [facility] Discharge Instructions  You will have repeat CT scans in 2 weeks. Follow up with is after your scans and continue on your normal treatment schedule.    Thank you for choosing [facility] at [facility] to provide your oncology and hematology care.  To afford each patient quality time with our provider, please arrive at least 15 minutes before your scheduled appointment time.   If you have a lab appointment with the [facility] please come in thru the  Main Entrance and check in at the main information desk  You need to re-schedule your appointment should you arrive 10 or more minutes late.  We strive to give you quality time with our providers, and arriving late affects you and other patients whose appointments are after yours.  Also, if you no show three or more times for appointments you may be dismissed from the clinic at the providers discretion.     Again, thank you for choosing [facility].  Our hope is that these requests will decrease the amount of time that you wait before being seen by our physicians.       _____________________________________________________________  Should you have questions after your visit to [facility], please contact our office at [phone] between the hours of 8:00 a.m. and 4:30 p.m.  Voicemails left after 4:00 p.m. will not be returned until the following business day.  For prescription refill requests, have your pharmacy contact our office and allow 72 hours.    Cancer Center Support Programs:   > Cancer Support Group  2nd Tuesday of the month 1pm-2pm, Journey Room

## 2018-02-26 NOTE — Assessment & Plan Note (Signed)
1.  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  No evidence of transformation. 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  - Bone marrow aspirate and flow cytometry positive for lymphoma involvement, chromosome analysis normal, CLL FISH panel normal - Plavix was discontinued after contacting his cardiologist Dr. Ellyn Hack -Ibrutinib 420 mg daily started on 12/01/2017, developed skin rash and ibrutinib was held on 12/13/2017 through 12/16/2017.  Rash improved with steroids and Ibrutinib was restarted back at the same dose on 12/16/2017.  He did not get any further rash.  No other side effects were noted. -Received first dose of rituximab with the start of cycle 2 on 01/01/2018 at 375 mg/m.  He will continue rituximab 500 mg/m cycles 3 through 7.  He has tolerated his first dose well without any reactions. - He is continuing to get excellent response.  Does not report any side effects from ibrutinib.  Very minor bruising reported.  He did receive rituximab 500 mg/m on 01/29/2018.  He does not report any reactions, fevers or infections from it. - I could not palpate any lymphadenopathy.  We will obtain a CT scan of his chest, abdomen and pelvis to evaluate response and I will see him back in 2 weeks for follow-up.  2.  TLS prophylaxis: -He took about 4 to 6 weeks of allopurinol since we started ibrutinib.  He no longer needs any TLS prophylaxis.

## 2018-02-26 NOTE — Progress Notes (Unsigned)
[place], [place] [id number]   CLINIC:  Medical Oncology/Hematology  PCP:  Asencion Noble, MD [address] [place] [address] [phone]   REASON FOR VISIT:  Follow-up for Chronic lymphocytic Leukemia  CURRENT THERAPY: rituximab  BRIEF ONCOLOGIC HISTORY:    Chronic lymphocytic leukemia (CLL), B-cell ([facility])   06/14/2015 Imaging    CT neck- Bulky adenopathy throughout the neck bilaterally. There also are enlarged parotid lymph nodes bilaterally. There is bilateral axillary adenopathy as well as mediastinal adenopathy. Findings are consistent with lymphoma. Biopsy recommended.    06/27/2015 Procedure    Left neck lymph node biopsy by Dr. Benjamine Mola    06/27/2015 Pathology Results    Diagnosis Lymph node for lymphoma, Left neck node for lymphoma work up - [place].  LOW GRADE.    06/27/2015 Pathology Results    Tissue-Flow Cytometry - MONOCLONAL B CELL POPULATION IDENTIFIED. The phenotypic features are consistent with small lymphocytic lymphoma/chronic lymphocytic leukemia and correlate well with the morphology in the lymph node     Lymphoma, small lymphocytic ([facility])   10/14/2017 Initial Diagnosis    Lymphoma, small lymphocytic ([facility])    12/25/2017 -  Chemotherapy    The patient had riTUXimab (RITUXAN) 900 mg in sodium chloride 0.9 % 250 mL (2.6471 mg/mL) infusion, 375 mg/m2 = 900 mg, Intravenous,  Once, 2 of 4 cycles Dose modification: 500 mg/m2 (original dose 500 mg/m2, Cycle 2, Reason: Provider Judgment) Administration: 900 mg (01/01/2018), 1,300 mg (01/29/2018)  for chemotherapy treatment.       INTERVAL HISTORY:  [name] 61 y.o. male returns for routine follow-up for chronic lymphocytic leukemia and consideration for next cycle of chemotherapy. He is doing well with his treatment. He has no complaints at this time. He reports his appetite and energy level at 100%. He has easy bruising since he has been on the  medication. Patient denies nausea, vomiting, or diarrhea. Denies any new pain. Denies itching or rash. Denies mouth sores.    REVIEW OF SYSTEMS:  Review of Systems  Hematological: Bruises/bleeds easily.  All other systems reviewed and are negative.    PAST MEDICAL/SURGICAL HISTORY:  Past Medical History:  Diagnosis Date  . 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 Enlarged lymph node    left neck  . STEMI (ST elevation myocardial infarction) ([facility]) 03/05/2017   hx/notes 03/05/2017 -likely aborted anterior STEMI with 50% bifurcation LAD-Diag1.  No PCI.  Preserved EF   Past Surgical History:  Procedure Laterality Date  . COLONOSCOPY N/A 10/11/2015   Procedure: COLONOSCOPY;  Surgeon: Rogene Houston, MD;  [facility];  Service: Endoscopy;  Laterality: N/A;  10/11/2015  . Graded Exercise Tolerance Test (GXT/ETT)  05/2017   10.7 METs (9: 25 min.  Reached 103% max predicted heart rate).  No EKG findings to suggest coronary ischemia.  Negative, low risk GXT  . LEFT HEART CATH AND CORONARY ANGIOGRAPHY N/A 03/05/2017   Procedure: LEFT HEART CATH AND CORONARY ANGIOGRAPHY;  Surgeon: Leonie Man, MD;  Location: [facility];  Service: Cardiovascular: pLAD-Diag1 50-55% (non-flow-limiiting).  EF ~50-55%.  although bifurcation lesion was presumed Culprit - no PCI (not flow limiting). - Med Rx.  [name] MASS BIOPSY Left 06/27/2015   Procedure: OPEN LEFT NECK BIOPSY ;  Surgeon: Leta Baptist, MD;  Location: [facility];  Service: ENT;  Laterality: Left;  . REFRACTIVE SURGERY Bilateral      SOCIAL HISTORY:  Social History   Socioeconomic History  . Marital status: Married    Spouse name: Not on file  . Number of children: Not on file  . Years of education: Not on file  . Highest education level: Not on file  Occupational  History  . Not on file  Social Needs  . Financial resource strain: Not on file  . Food insecurity:    Worry: Not on file    Inability: Not on file  . Transportation needs:    Medical: Not on file    Non-medical: Not on file  Tobacco Use  . Smoking status: Former Smoker    Years: 2.00    Types: Cigarettes  . Smokeless tobacco: Former Systems developer    Types: Chew, Snuff  . Tobacco comment: 03/06/2017 "quit smoking when I was young; quit chew/snuff in ~ 2008"  Substance and Sexual Activity  . Alcohol use: Yes    Comment: 03/06/2017 "maybe 6 pack beer/month"  . Drug use: No  . Sexual activity: Yes  Lifestyle  . Physical activity:    Days per week: Not on file    Minutes per session: Not on file  . Stress: Not on file  Relationships  . Social connections:    Talks on phone: Not on file    Gets together: Not on file    Attends religious service: Not on file    Active member of club or organization: Not on file    Attends meetings of clubs or organizations: Not on file    Relationship status: Not on file  . Intimate partner violence:    Fear of current or ex partner: Not on file    Emotionally abused: Not on file    Physically abused: Not on file    Forced sexual activity: Not on file  Other Topics Concern  . Not on file  Social History Narrative  . Not on file    FAMILY HISTORY:  Family History  Problem Relation Age of Onset  . Diabetes Mother   . Cancer Father   . Cancer Brother     CURRENT MEDICATIONS:  Outpatient Encounter Medications as of 02/26/2018  Medication Sig Note  . allopurinol (ZYLOPRIM) 300 MG tablet TAKE 1 TABLET(300 MG) BY MOUTH DAILY   . aspirin 81 MG chewable tablet Chew 1 tablet (81 mg total) by mouth every other day.   [name] atorvastatin (LIPITOR) 80 MG tablet Take 1 tablet (80 mg total) by mouth daily at 6 PM.   . chlorhexidine (PERIDEX) 0.12 % solution  02/26/2018: completed  . Ibrutinib 420 MG TABS Take 1 capsule by mouth daily. Take same time every day  with a glass of water   . metoprolol tartrate (LOPRESSOR) 25 MG tablet Take 1 tablet (25 mg total) by mouth 2 (two) times daily.   . naproxen sodium (ANAPROX) 550 MG tablet TK 1 T PO Q 12 H PRN   . nitroGLYCERIN (NITROSTAT) 0.4 MG SL tablet Place 1 tablet (0.4 mg total) under the tongue every 5 (five) minutes as needed for chest pain.   [name] prochlorperazine (COMPAZINE) 10 MG tablet Take 1 tablet (10 mg total) by mouth every 6 (six) hours as needed for nausea or vomiting.    No facility-administered encounter medications on file as of 02/26/2018.     ALLERGIES:  No Known Allergies   PHYSICAL  EXAM:  ECOG Performance status: 1  VITAL SIGNS: BP: 134/63, P: 56, R: 18, TEMP: 97.9, SATS:97% WEIGHT: 266  Physical Exam  Constitutional: He is oriented to person, place, and time. He appears well-developed and well-nourished.  Cardiovascular: Normal rate, regular rhythm and normal heart sounds.  Pulmonary/Chest: Effort normal and breath sounds normal.  Neurological: He is alert and oriented to person, place, and time.  Skin: Skin is warm and dry.     LABORATORY DATA:  I have reviewed the labs as listed.  CBC    Component Value Date/Time   WBC 6.5 02/26/2018 0812   RBC 4.63 02/26/2018 0812   HGB 14.4 02/26/2018 0812   HCT 43.0 02/26/2018 0812   PLT 130 (L) 02/26/2018 0812   MCV 92.9 02/26/2018 0812   MCH 31.1 02/26/2018 0812   MCHC 33.5 02/26/2018 0812   RDW 13.4 02/26/2018 0812   LYMPHSABS 1.0 02/26/2018 0812   MONOABS 0.5 02/26/2018 0812   EOSABS 0.1 02/26/2018 0812   BASOSABS 0.0 02/26/2018 0812   CMP Latest Ref Rng & Units 01/26/2018 01/08/2018 01/01/2018  Glucose 70 - 99 mg/dL 75 103(H) 110(H)  BUN 6 - 20 mg/dL 20 20 21(H)  Creatinine 0.61 - 1.24 mg/dL 0.83 0.93 0.87  Sodium 135 - 145 mmol/L 139 139 138  Potassium 3.5 - 5.1 mmol/L 3.5 4.1 3.6  Chloride 98 - 111 mmol/L 106 104 104  CO2 22 - 32 mmol/L 28 28 27   Calcium 8.9 - 10.3 mg/dL 8.8(L) 8.8(L) 9.0  Total Protein 6.5 -  8.1 g/dL 6.7 6.3(L) 6.6  Total Bilirubin 0.3 - 1.2 mg/dL 1.3(H) 1.2 1.6(H)  Alkaline Phos 38 - 126 U/L 59 52 60  AST 15 - 41 U/L 26 41 44(H)  ALT 0 - 44 U/L 25 47(H) 51       DIAGNOSTIC IMAGING:  *The following radiologic images and reports have been reviewed independently and agree with below findings.      ASSESSMENT & PLAN:   No problem-specific Assessment & Plan notes found for this encounter.      Orders placed this encounter:  No orders of the defined types were placed in this encounter.     Derek Jack, MD [facility] [phone]

## 2018-02-26 NOTE — Patient Instructions (Signed)
[facility] at [facility]  Discharge Instructions:  Today you received your Rituxan infusion. Follow up as scheduled. Call clinic for any questions or concerns. _______________________________________________________________  Thank you for choosing [facility] at [facility] to provide your oncology and hematology care.  To afford each patient quality time with our providers, please arrive at least 15 minutes before your scheduled appointment.  You need to re-schedule your appointment if you arrive 10 or more minutes late.  We strive to give you quality time with our providers, and arriving late affects you and other patients whose appointments are after yours.  Also, if you no show three or more times for appointments you may be dismissed from the clinic.  Again, thank you for choosing [facility] at [facility] hope is that these requests will allow you access to exceptional care and in a timely manner. _______________________________________________________________  If you have questions after your visit, please contact our office at [phone] between the hours of 8:30 a.m. and 5:00 p.m. Voicemails left after 4:30 p.m. will not be returned until the following business day. _______________________________________________________________  For prescription refill requests, have your pharmacy contact our office. _______________________________________________________________  Recommendations made by the consultant and any test results will be sent to your referring physician. _______________________________________________________________

## 2018-02-26 NOTE — Progress Notes (Signed)
0925 Labs reviewed with and pt seen by Dr. Delton Coombes and pt approved for Rituxan infusion today per MD                                                                  Adam Reyes tolerated Rituxan infusion well without complaints or incident. Peripheral IV site checked by 2 RN's with positive blood return noted prior to,during and after this infusion. VSS upon discharge. Pt discharged self ambulatory in satisfactory condition accompanied by his wife

## 2018-03-04 ENCOUNTER — Other Ambulatory Visit: Payer: Self-pay | Admitting: Cardiology

## 2018-03-04 MED ORDER — METOPROLOL TARTRATE 25 MG PO TABS: 25.0000 mg | ORAL_TABLET | Freq: Two times a day (BID) | ORAL | 11 refills | Status: DC

## 2018-03-04 NOTE — Telephone Encounter (Signed)
°*  STAT* If patient is at the pharmacy, call can be transferred to refill team.   1. Which medications need to be refilled? (please list name of each medication and dose if known) Metoprolol and Atorvastatin- pt need this medicine until his 04-21-09 appt please  2. Which pharmacy/location (including street and city if local pharmacy) is medication to be sent to?[facility]  3. Do they need a 30 day or 90 day supply? Enough until 04-21-18

## 2018-03-05 MED ORDER — ATORVASTATIN CALCIUM 80 MG PO TABS: 80.0000 mg | ORAL_TABLET | Freq: Every day | ORAL | 2 refills | Status: DC

## 2018-03-05 NOTE — Addendum Note (Signed)
Addended by: Waylan Rocher on: 03/05/2018 07:47 AM   Modules accepted: Orders

## 2018-03-11 ENCOUNTER — Ambulatory Visit (HOSPITAL_COMMUNITY)
Admission: RE | Admit: 2018-03-11 | Discharge: 2018-03-11 | Disposition: A | Discharge status: Home / Self Care | Payer: 59 | Source: Ambulatory Visit | Attending: Nurse Practitioner | Admitting: Nurse Practitioner

## 2018-03-11 DIAGNOSIS — C8300 Small cell B-cell lymphoma, unspecified site: Secondary | ICD-10-CM | POA: Insufficient documentation

## 2018-03-11 DIAGNOSIS — R161 Splenomegaly, not elsewhere classified: Secondary | ICD-10-CM | POA: Diagnosis not present

## 2018-03-11 DIAGNOSIS — R918 Other nonspecific abnormal finding of lung field: Secondary | ICD-10-CM | POA: Diagnosis not present

## 2018-03-11 MED ORDER — IOPAMIDOL (ISOVUE-300) INJECTION 61%
100.0000 mL | Freq: Once | INTRAVENOUS | Status: AC | PRN
  Administered 2018-03-11: 100 mL via INTRAVENOUS

## 2018-03-13 ENCOUNTER — Inpatient Hospital Stay (HOSPITAL_COMMUNITY): Payer: 59 | Admitting: Hematology

## 2018-03-13 ENCOUNTER — Encounter (HOSPITAL_COMMUNITY): Payer: Self-pay | Admitting: Hematology

## 2018-03-13 VITALS — BP 126/85 | HR 56 | Temp 98.0°F | Resp 14 | Wt 270.3 lb

## 2018-03-13 DIAGNOSIS — Z5112 Encounter for antineoplastic immunotherapy: Secondary | ICD-10-CM | POA: Diagnosis not present

## 2018-03-13 DIAGNOSIS — C9110 Chronic lymphocytic leukemia of B-cell type not having achieved remission: Secondary | ICD-10-CM

## 2018-03-13 DIAGNOSIS — C8300 Small cell B-cell lymphoma, unspecified site: Secondary | ICD-10-CM

## 2018-03-13 NOTE — Assessment & Plan Note (Signed)
1.  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  No evidence of transformation. 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  - Bone marrow aspirate and flow cytometry positive for lymphoma involvement, chromosome analysis normal, CLL FISH panel normal - Plavix was discontinued after contacting his cardiologist Dr. Ellyn Hack -Ibrutinib 420 mg daily started on 12/01/2017, developed skin rash and ibrutinib was held on 12/13/2017 through 12/16/2017.  Rash improved with steroids and Ibrutinib was restarted back at the same dose on 12/16/2017.  He did not get any further rash.  No other side effects were noted. -Received first dose of rituximab with the start of cycle 2 on 01/01/2018 at 375 mg/m.  He will continue rituximab 500 mg/m cycles 3 through 7.  He has tolerated his first dose well without any reactions. - He is continuing to get excellent response.  Does not report any side effects from ibrutinib.  Very minor bruising reported.  He did receive rituximab 500 mg/m on 01/29/2018.  He does not report any reactions, fevers or infections from it. - Examination today revealed subcentimeter lymph nodes in the neck and axilla.  We reviewed the results of the CT scan of the CAP on 03/11/2018 which showed response to therapy with decreasing size of nodes in the chest, abdomen and pelvis.  Spleen size has also improved.  Decreased size of extranodal soft tissue mass of the right flank previously 2.5 cm, now 13 mm. - He is tolerating ibrutinib very well.  He will come back on 12 September for his fourth rituximab infusion.  I will see him back at the time of his fifth infusion.

## 2018-03-13 NOTE — Patient Instructions (Signed)
[facility] at [facility] Discharge Instructions     Thank you for choosing [facility] at [facility] to provide your oncology and hematology care.  To afford each patient quality time with our provider, please arrive at least 15 minutes before your scheduled appointment time.   If you have a lab appointment with the Cancer Center please come in thru the  Main Entrance and check in at the main information desk  You need to re-schedule your appointment should you arrive 10 or more minutes late.  We strive to give you quality time with our providers, and arriving late affects you and other patients whose appointments are after yours.  Also, if you no show three or more times for appointments you may be dismissed from the clinic at the providers discretion.     Again, thank you for choosing [facility].  Our hope is that these requests will decrease the amount of time that you wait before being seen by our physicians.       _____________________________________________________________  Should you have questions after your visit to [facility], please contact our office at [phone] between the hours of 8:00 a.m. and 4:30 p.m.  Voicemails left after 4:00 p.m. will not be returned until the following business day.  For prescription refill requests, have your pharmacy contact our office and allow 72 hours.    Cancer Center Support Programs:   > Cancer Support Group  2nd Tuesday of the month 1pm-2pm, Journey Room    

## 2018-03-13 NOTE — Progress Notes (Signed)
Adam Reyes, Adam Reyes [id number]   CLINIC:  Medical Oncology/Hematology  PCP:  Asencion Noble, MD [address] [place] [address] [phone]   REASON FOR VISIT: Follow-up for Chronic lymphocytic leukemia  CURRENT THERAPY: Ibrutinib 420mg  daily  and rituximab once a month  BRIEF ONCOLOGIC HISTORY:    Chronic lymphocytic leukemia (CLL), B-cell ([facility])   06/14/2015 Imaging    CT neck- Bulky adenopathy throughout the neck bilaterally. There also are enlarged parotid lymph nodes bilaterally. There is bilateral axillary adenopathy as well as mediastinal adenopathy. Findings are consistent with lymphoma. Biopsy recommended.    06/27/2015 Procedure    Left neck lymph node biopsy by Dr. Benjamine Mola    06/27/2015 Pathology Results    Diagnosis Lymph node for lymphoma, Left neck node for lymphoma work up - [place].  LOW GRADE.    06/27/2015 Pathology Results    Tissue-Flow Cytometry - MONOCLONAL B CELL POPULATION IDENTIFIED. The phenotypic features are consistent with small lymphocytic lymphoma/chronic lymphocytic leukemia and correlate well with the morphology in the lymph node     Lymphoma, small lymphocytic ([facility])   10/14/2017 Initial Diagnosis    Lymphoma, small lymphocytic ([facility])    12/25/2017 -  Chemotherapy    The patient had riTUXimab (RITUXAN) 900 mg in sodium chloride 0.9 % 250 mL (2.6471 mg/mL) infusion, 375 mg/m2 = 900 mg, Intravenous,  Once, 3 of 4 cycles Dose modification: 500 mg/m2 (original dose 500 mg/m2, Cycle 2, Reason: Provider Judgment) Administration: 900 mg (01/01/2018), 1,300 mg (01/29/2018), 1,300 mg (02/26/2018)  for chemotherapy treatment.       INTERVAL HISTORY:  Adam Reyes 62 y.o. male returns for routine follow-up for CLL. Patient is here today with his wife. He has done well with his treatments. He had one episode of diarrhea after his first tretament and has not had an issue since. He has no other  complaints. He is working full time and remaining active. Patient denies any nausea or vomiting. Denies any headaches. Denies any fevers, chills, weight loss, or night sweats.    REVIEW OF SYSTEMS:  Review of Systems  Skin: Positive for itching and rash.  Hematological: Bruises/bleeds easily.  All other systems reviewed and are negative.    PAST MEDICAL/SURGICAL HISTORY:  Past Medical History:  Diagnosis Date  . 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 Enlarged lymph node    left neck  . STEMI (ST elevation myocardial infarction) ([facility]) 03/05/2017   hx/notes 03/05/2017 -likely aborted anterior STEMI with 50% bifurcation LAD-Diag1.  No PCI.  Preserved EF   Past Surgical History:  Procedure Laterality Date  . COLONOSCOPY N/A 10/11/2015   Procedure: COLONOSCOPY;  Surgeon: Rogene Houston, MD;  [facility];  Service: Endoscopy;  Laterality: N/A;  10/11/2015  . Graded Exercise Tolerance Test (GXT/ETT)  05/2017   10.7 METs (9: 25 min.  Reached 103% max predicted heart rate).  No EKG findings to suggest coronary ischemia.  Negative, low risk GXT  . LEFT HEART CATH AND CORONARY ANGIOGRAPHY N/A 03/05/2017   Procedure: LEFT HEART CATH AND CORONARY ANGIOGRAPHY;  Surgeon: Leonie Man, MD;  Location: [facility];  Service: Cardiovascular: pLAD-Diag1 50-55% (non-flow-limiiting).  EF ~50-55%.  although bifurcation lesion was presumed Culprit - no PCI (not flow limiting). - Med Rx.  [name]  MASS BIOPSY Left 06/27/2015   Procedure: OPEN LEFT NECK BIOPSY ;  Surgeon: Leta Baptist, MD;  Location: [facility];  Service: ENT;  Laterality: Left;  . REFRACTIVE SURGERY Bilateral      SOCIAL HISTORY:  Social History   Socioeconomic History  . Marital status: Married    Spouse name: Not on file  . Number of children: Not on file    . Years of education: Not on file  . Highest education level: Not on file  Occupational History  . Not on file  Social Needs  . Financial resource strain: Not on file  . Food insecurity:    Worry: Not on file    Inability: Not on file  . Transportation needs:    Medical: Not on file    Non-medical: Not on file  Tobacco Use  . Smoking status: Former Smoker    Years: 2.00    Types: Cigarettes  . Smokeless tobacco: Former Systems developer    Types: Chew, Snuff  . Tobacco comment: 03/06/2017 "quit smoking when I was young; quit chew/snuff in ~ 2008"  Substance and Sexual Activity  . Alcohol use: Yes    Comment: 03/06/2017 "maybe 6 pack beer/month"  . Drug use: No  . Sexual activity: Yes  Lifestyle  . Physical activity:    Days per week: Not on file    Minutes per session: Not on file  . Stress: Not on file  Relationships  . Social connections:    Talks on phone: Not on file    Gets together: Not on file    Attends religious service: Not on file    Active member of club or organization: Not on file    Attends meetings of clubs or organizations: Not on file    Relationship status: Not on file  . Intimate partner violence:    Fear of current or ex partner: Not on file    Emotionally abused: Not on file    Physically abused: Not on file    Forced sexual activity: Not on file  Other Topics Concern  . Not on file  Social History Narrative  . Not on file    FAMILY HISTORY:  Family History  Problem Relation Age of Onset  . Diabetes Mother   . Cancer Father   . Cancer Brother     CURRENT MEDICATIONS:  Outpatient Encounter Medications as of 03/13/2018  Medication Sig Note  . aspirin 81 MG chewable tablet Chew 1 tablet (81 mg total) by mouth every other day.   [name] atorvastatin (LIPITOR) 80 MG tablet Take 1 tablet (80 mg total) by mouth daily at 6 PM.   . chlorhexidine (PERIDEX) 0.12 % solution  02/26/2018: completed  . Ibrutinib 420 MG TABS Take 1 capsule by mouth daily. Take same time  every day with a glass of water   . metoprolol tartrate (LOPRESSOR) 25 MG tablet Take 1 tablet (25 mg total) by mouth 2 (two) times daily.   . naproxen sodium (ANAPROX) 550 MG tablet TK 1 T PO Q 12 H PRN   . nitroGLYCERIN (NITROSTAT) 0.4 MG SL tablet Place 1 tablet (0.4 mg total) under the tongue every 5 (five) minutes as needed for chest pain.   [name] prochlorperazine (COMPAZINE) 10 MG tablet Take 1 tablet (10 mg total) by mouth every 6 (six) hours as needed for nausea or vomiting.   . [DISCONTINUED] allopurinol (ZYLOPRIM) 300 MG tablet TAKE 1 TABLET(300 MG) BY MOUTH DAILY (Patient not taking: Reported  on 03/13/2018)    No facility-administered encounter medications on file as of 03/13/2018.     ALLERGIES:  No Known Allergies   PHYSICAL EXAM:  ECOG Performance status: 1  Vitals:   03/13/18 0816  BP: 126/85  Pulse: (!) 56  Resp: 14  Temp: 98 F (36.7 C)  SpO2: 98%   Filed Weights   03/13/18 0816  Weight: 270 lb 4.8 oz (122.6 kg)    Physical Exam  Constitutional: He is oriented to person, place, and time. He appears well-developed and well-nourished.  Cardiovascular: Normal rate, regular rhythm and normal heart sounds.  Pulmonary/Chest: Effort normal and breath sounds normal.  Neurological: He is alert and oriented to person, place, and time.  Skin: Skin is warm and dry.  Subcentimeter lymphadenopathy in the neck and axillary region. Abdomen: No palpable hepatospleno megaly.  No palpable inguinal lymph adenopathy.   LABORATORY DATA:  I have reviewed the labs as listed.  CBC    Component Value Date/Time   WBC 6.5 02/26/2018 0812   RBC 4.63 02/26/2018 0812   HGB 14.4 02/26/2018 0812   HCT 43.0 02/26/2018 0812   PLT 130 (L) 02/26/2018 0812   MCV 92.9 02/26/2018 0812   MCH 31.1 02/26/2018 0812   MCHC 33.5 02/26/2018 0812   RDW 13.4 02/26/2018 0812   LYMPHSABS 1.0 02/26/2018 0812   MONOABS 0.5 02/26/2018 0812   EOSABS 0.1 02/26/2018 0812   BASOSABS 0.0 02/26/2018 0812    CMP Latest Ref Rng & Units 02/26/2018 01/26/2018 01/08/2018  Glucose 70 - 99 mg/dL 124(H) 75 103(H)  BUN 6 - 20 mg/dL 25(H) 20 20  Creatinine 0.61 - 1.24 mg/dL 0.92 0.83 0.93  Sodium 135 - 145 mmol/L 138 139 139  Potassium 3.5 - 5.1 mmol/L 3.7 3.5 4.1  Chloride 98 - 111 mmol/L 104 106 104  CO2 22 - 32 mmol/L 25 28 28   Calcium 8.9 - 10.3 mg/dL 8.9 8.8(L) 8.8(L)  Total Protein 6.5 - 8.1 g/dL 6.6 6.7 6.3(L)  Total Bilirubin 0.3 - 1.2 mg/dL 1.6(H) 1.3(H) 1.2  Alkaline Phos 38 - 126 U/L 60 59 52  AST 15 - 41 U/L 32 26 41  ALT 0 - 44 U/L 30 25 47(H)       DIAGNOSTIC IMAGING:  I have reviewed his CT scan images and discussed with him.     ASSESSMENT & PLAN:   Lymphoma, small lymphocytic ([facility]) 1.  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  No evidence of transformation. 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  - Bone marrow aspirate and flow cytometry positive for lymphoma involvement, chromosome analysis normal, CLL FISH panel normal - Plavix was discontinued after contacting his cardiologist Dr. Ellyn Hack -Ibrutinib 420 mg daily started on 12/01/2017, developed skin rash and ibrutinib was held on 12/13/2017 through 12/16/2017.  Rash improved with steroids and Ibrutinib was restarted back at the same dose on 12/16/2017.  He did not get any further rash.  No other side effects were noted. -Received first dose of rituximab with the start of cycle 2 on 01/01/2018 at 375 mg/m.  He will continue rituximab 500 mg/m cycles 3 through 7.  He has tolerated his first dose well without any reactions. - He is continuing to get excellent response.  Does not report any  side effects from ibrutinib.  Very minor bruising reported.  He did receive rituximab 500 mg/m on 01/29/2018.  He does not report any  reactions, fevers or infections from it. - Examination today revealed subcentimeter lymph nodes in the neck and axilla.  We reviewed the results of the CT scan of the CAP on 03/11/2018 which showed response to therapy with decreasing size of nodes in the chest, abdomen and pelvis.  Spleen size has also improved.  Decreased size of extranodal soft tissue mass of the right flank previously 2.5 cm, now 13 mm. - He is tolerating ibrutinib very well.  He will come back on 12 September for his fourth rituximab infusion.  I will see him back at the time of his fifth infusion.        Orders placed this encounter:  Orders Placed This Encounter  Procedures  . CBC with Differential/Platelet  . Comprehensive metabolic panel  . Lactate dehydrogenase  . CBC with Differential/Platelet  . Comprehensive metabolic panel  . Lactate dehydrogenase      Derek Jack, MD [place] [phone]

## 2018-03-20 ENCOUNTER — Other Ambulatory Visit (HOSPITAL_COMMUNITY): Payer: Self-pay

## 2018-03-20 DIAGNOSIS — C9110 Chronic lymphocytic leukemia of B-cell type not having achieved remission: Secondary | ICD-10-CM

## 2018-03-20 DIAGNOSIS — C8300 Small cell B-cell lymphoma, unspecified site: Secondary | ICD-10-CM

## 2018-03-26 ENCOUNTER — Inpatient Hospital Stay (HOSPITAL_COMMUNITY): Payer: 59

## 2018-03-26 ENCOUNTER — Encounter (HOSPITAL_COMMUNITY): Payer: Self-pay

## 2018-03-26 ENCOUNTER — Inpatient Hospital Stay (HOSPITAL_COMMUNITY): Payer: 59 | Attending: Hematology

## 2018-03-26 VITALS — BP 133/69 | HR 54 | Temp 97.8°F | Resp 16 | Wt 267.9 lb

## 2018-03-26 DIAGNOSIS — C8300 Small cell B-cell lymphoma, unspecified site: Secondary | ICD-10-CM | POA: Insufficient documentation

## 2018-03-26 DIAGNOSIS — Z5112 Encounter for antineoplastic immunotherapy: Secondary | ICD-10-CM | POA: Diagnosis present

## 2018-03-26 DIAGNOSIS — C9110 Chronic lymphocytic leukemia of B-cell type not having achieved remission: Secondary | ICD-10-CM

## 2018-03-26 LAB — COMPREHENSIVE METABOLIC PANEL
ALK PHOS: 63 U/L (ref 38–126)
ALT: 26 U/L (ref 0–44)
AST: 32 U/L (ref 15–41)
Albumin: 4.1 g/dL (ref 3.5–5.0)
Anion gap: 7 (ref 5–15)
BILIRUBIN TOTAL: 1.4 mg/dL — AB (ref 0.3–1.2)
BUN: 20 mg/dL (ref 6–20)
CO2: 29 mmol/L (ref 22–32)
CREATININE: 1.05 mg/dL (ref 0.61–1.24)
Calcium: 9 mg/dL (ref 8.9–10.3)
Chloride: 105 mmol/L (ref 98–111)
GFR calc Af Amer: >60 mL/min (ref >60)
GFR calc non Af Amer: >60 mL/min (ref >60)
Glucose, Bld: 191 mg/dL — ABNORMAL HIGH (ref 70–99)
Potassium: 4 mmol/L (ref 3.5–5.1)
Sodium: 141 mmol/L (ref 135–145)
Total Protein: 6.6 g/dL (ref 6.5–8.1)

## 2018-03-26 LAB — LACTATE DEHYDROGENASE: LDH: 156 U/L (ref 98–192)

## 2018-03-26 LAB — CBC WITH DIFFERENTIAL/PLATELET
BASOS PCT: 1 %
Basophils Absolute: 0 10*3/uL (ref 0.0–0.1)
EOS ABS: 0.1 10*3/uL (ref 0.0–0.7)
Eosinophils Relative: 2 %
HEMATOCRIT: 45 % (ref 39.0–52.0)
Hemoglobin: 15.2 g/dL (ref 13.0–17.0)
LYMPHS ABS: 0.9 10*3/uL (ref 0.7–4.0)
Lymphocytes Relative: 15 %
MCH: 31.9 pg (ref 26.0–34.0)
MCHC: 33.8 g/dL (ref 30.0–36.0)
MCV: 94.3 fL (ref 78.0–100.0)
MONOS PCT: 9 %
Monocytes Absolute: 0.5 10*3/uL (ref 0.1–1.0)
NEUTROS ABS: 4.4 10*3/uL (ref 1.7–7.7)
NEUTROS PCT: 73 %
Platelets: 114 10*3/uL — ABNORMAL LOW (ref 150–400)
RBC: 4.77 MIL/uL (ref 4.22–5.81)
RDW: 12.9 % (ref 11.5–15.5)
WBC: 5.9 10*3/uL (ref 4.0–10.5)

## 2018-03-26 LAB — URIC ACID: Uric Acid, Serum: 7.4 mg/dL (ref 3.7–8.6)

## 2018-03-26 MED ORDER — SODIUM CHLORIDE 0.9 % IV SOLN
500.0000 mg/m2 | Freq: Once | INTRAVENOUS | Status: AC
  Administered 2018-03-26: 1300 mg via INTRAVENOUS
  Filled 2018-03-26: qty 30

## 2018-03-26 MED ORDER — ACETAMINOPHEN 325 MG PO TABS
650.0000 mg | ORAL_TABLET | Freq: Once | ORAL | Status: AC
  Administered 2018-03-26: 650 mg via ORAL
  Filled 2018-03-26: qty 2

## 2018-03-26 MED ORDER — DIPHENHYDRAMINE HCL 50 MG/ML IJ SOLN
50.0000 mg | Freq: Once | INTRAMUSCULAR | Status: AC
  Administered 2018-03-26: 50 mg via INTRAVENOUS
  Filled 2018-03-26: qty 1

## 2018-03-26 MED ORDER — SODIUM CHLORIDE 0.9% FLUSH: 10.0000 mL | INTRAVENOUS | Status: DC | PRN

## 2018-03-26 MED ORDER — FAMOTIDINE IN NACL 20-0.9 MG/50ML-% IV SOLN
20.0000 mg | Freq: Two times a day (BID) | INTRAVENOUS | Status: DC
  Administered 2018-03-26: 20 mg via INTRAVENOUS
  Filled 2018-03-26: qty 50

## 2018-03-26 MED ORDER — SODIUM CHLORIDE 0.9 % IV SOLN
Freq: Once | INTRAVENOUS | Status: AC
  Administered 2018-03-26: 09:00:00 via INTRAVENOUS

## 2018-03-26 NOTE — Progress Notes (Signed)
To treatment area for rituxan.  Labs reviewed and treatment released to pharmacy.  No complaints voiced by the patient.  Stated he has noticed small areas of "bruising" a couple different areas on abdomen.  Areas checked and looked like petechia bruising the size of a fifty cent piece.  No pain to touch or knot noted under skin.  Instructed the patient of any changes or worsening to notify the CC.  Ibrutinib information given and reviewed with the patient with understanding verbalized.   Patient tolerated treatment with no complaints voiced.  Peripheral site with good blood return noted before and after administration of therapy.  Band aid applied.  No complaints of pain at site.  VSS with discharge and left ambulatory with no s/s of distress noted.

## 2018-03-26 NOTE — Patient Instructions (Signed)
[facility] Discharge Instructions for Patients Receiving Chemotherapy  Today you received the following chemotherapy agents rituxan today.    If you develop nausea and vomiting that is not controlled by your nausea medication, call the clinic.   BELOW ARE SYMPTOMS THAT SHOULD BE REPORTED IMMEDIATELY:  *FEVER GREATER THAN 100.5 F  *CHILLS WITH OR WITHOUT FEVER  NAUSEA AND VOMITING THAT IS NOT CONTROLLED WITH YOUR NAUSEA MEDICATION  *UNUSUAL SHORTNESS OF BREATH  *UNUSUAL BRUISING OR BLEEDING  TENDERNESS IN MOUTH AND THROAT WITH OR WITHOUT PRESENCE OF ULCERS  *URINARY PROBLEMS  *BOWEL PROBLEMS  UNUSUAL RASH Items with * indicate a potential emergency and should be followed up as soon as possible.  Feel free to call the clinic should you have any questions or concerns. The clinic phone number is [phone].  Please show the [facility] at check-in to the Emergency Department and triage nurse.

## 2018-04-20 ENCOUNTER — Telehealth (HOSPITAL_COMMUNITY): Payer: Self-pay | Admitting: Hematology

## 2018-04-20 NOTE — Telephone Encounter (Signed)
Pt called re:Imburvica rx. Stating he has new ins and needs a refill on meds. Asked if pt would mind switching to [employer] he stating that would be Fine. Will have rx e-scriped to [facility] for review.

## 2018-04-21 ENCOUNTER — Ambulatory Visit: Payer: Managed Care, Other (non HMO) | Admitting: Cardiology

## 2018-04-21 ENCOUNTER — Telehealth: Payer: Self-pay | Admitting: Pharmacy Technician

## 2018-04-21 ENCOUNTER — Encounter: Payer: Self-pay | Admitting: Cardiology

## 2018-04-21 ENCOUNTER — Telehealth (HOSPITAL_COMMUNITY): Payer: Self-pay | Admitting: Pharmacist

## 2018-04-21 ENCOUNTER — Telehealth (HOSPITAL_COMMUNITY): Payer: Self-pay | Admitting: Pharmacy Technician

## 2018-04-21 VITALS — BP 126/70 | HR 69 | Ht 76.0 in | Wt 273.0 lb

## 2018-04-21 DIAGNOSIS — E785 Hyperlipidemia, unspecified: Secondary | ICD-10-CM | POA: Diagnosis not present

## 2018-04-21 DIAGNOSIS — I951 Orthostatic hypotension: Secondary | ICD-10-CM | POA: Diagnosis not present

## 2018-04-21 DIAGNOSIS — I2510 Atherosclerotic heart disease of native coronary artery without angina pectoris: Secondary | ICD-10-CM | POA: Diagnosis not present

## 2018-04-21 DIAGNOSIS — C8300 Small cell B-cell lymphoma, unspecified site: Secondary | ICD-10-CM

## 2018-04-21 HISTORY — DX: Orthostatic hypotension: I95.1

## 2018-04-21 LAB — LIPID PANEL
Chol/HDL Ratio: 2.5 ratio (ref 0.0–5.0)
Cholesterol, Total: 96 mg/dL — ABNORMAL LOW (ref 100–199)
HDL: 38 mg/dL — AB (ref >39)
LDL Calculated: 45 mg/dL (ref 0–99)
TRIGLYCERIDES: 66 mg/dL (ref 0–149)
VLDL Cholesterol Cal: 13 mg/dL (ref 5–40)

## 2018-04-21 LAB — HEPATIC FUNCTION PANEL
ALK PHOS: 63 IU/L (ref 39–117)
ALT: 29 IU/L (ref 0–44)
AST: 27 IU/L (ref 0–40)
Albumin: 4.4 g/dL (ref 3.6–4.8)
Bilirubin Total: 1.1 mg/dL (ref 0.0–1.2)
Bilirubin, Direct: 0.34 mg/dL (ref 0.00–0.40)
Total Protein: 6.2 g/dL (ref 6.0–8.5)

## 2018-04-21 MED ORDER — IBRUTINIB 420 MG PO TABS: 420.0000 mg | ORAL_TABLET | Freq: Every day | ORAL | 3 refills | Status: DC

## 2018-04-21 MED ORDER — METOPROLOL TARTRATE 25 MG PO TABS: 12.5000 mg | ORAL_TABLET | Freq: Two times a day (BID) | ORAL | 11 refills | Status: DC

## 2018-04-21 MED FILL — IMBRUVICA 420 MG TAB: 420 | 28 days supply | Qty: 28 | Fill #0

## 2018-04-21 NOTE — Telephone Encounter (Signed)
Oral Chemotherapy Pharmacist Encounter  I spoke with patient for overview of refill prescription for Stage IV small lymphocytic lymphoma in conjunction with rituximab, planned duration until disease progression or unacceptable drug toxicity. Medication started 11/2017.   Pt was previously filling at an outside pharmacy but the medication is now being filled at [facility]   Pt is doing well. Reviewed administration, dosing, side effects, monitoring, drug-food interactions, safe handling, storage, and disposal. Patient will take 420 mg by mouth daily.   Adam Reyes had a rash when he first started his ibrutinib but that has since resolved. He reports no additionally side effects on the ibrutinib.     Reviewed with patient importance of keeping a medication schedule and plan for any missed doses.   Adam Reyes voiced understanding and appreciation. All questions answered.   Patient knows to call the office with questions or concerns. Oral Oncology Clinic will continue to follow.  Darl Pikes, PharmD, BCPS, [facility] Hematology/Oncology Clinical Pharmacist [facility] [phone]  04/21/2018 1:27 PM

## 2018-04-21 NOTE — Assessment & Plan Note (Signed)
Continue medical management of moderate LAD-D1 bifurcation disease.  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  Continues to be on aspirin, statin and low-dose beta-blocker.  Since he is noticing a little bit of fatigue and heat sensitivity with his oncology medication, I would like to try to see if he can back down his beta-blocker to 12.5 mg twice daily  Not requiring PRN nitro

## 2018-04-21 NOTE — Telephone Encounter (Signed)
Oral Oncology Pharmacist Encounter  Received refill prescription for Imbruvica (ibrutinib) for the treatment of Stage IV small lymphocytic lymphoma in conjunction with rituximab, planned duration until disease progression or unacceptable drug toxicity. Medication started 11/2017.  CBC from 03/26/18 assessed, no relevant lab abnormalities. BP from 04/21/18 assessed, BP well controlled. Prescription dose and frequency assessed.   Current medication list in Epic reviewed, a few DDIs with ibrutinib identified: - Ibrutinib may enhance the adverse effect of medications with antiplatelet properties like Mr. Pottenger aspirin and naproxen. No adjusted needed at this time, continue to monitor plt count and for s/sx of bleeding.   Prescription has been e-scribed to the [facility] and we will see if the insurance company will let it be filled there.  Oral Oncology Clinic will continue to follow patient.   Darl Pikes, PharmD, BCPS, [facility] Hematology/Oncology Clinical Pharmacist [facility] [phone]  04/21/2018 8:44 AM

## 2018-04-21 NOTE — Assessment & Plan Note (Signed)
I permission agree with his assessment that this has to do with standing up too quickly and being dehydrated and significant heat.  Very brief loss consciousness but no associated symptoms of palpitations or rapid irregular heartbeats.

## 2018-04-21 NOTE — Telephone Encounter (Signed)
Oral Oncology Patient Advocate Encounter   I spoke with Adam Reyes and set up his prescription for Imbruvica to be filled at [facility].  The prescription will be mailed today and delivered 04/22/18.  I will check the delivery status of his prescription on Thursday.  [name] Phone [phone] Fax [phone] 04/21/2018 1:57 PM

## 2018-04-21 NOTE — Telephone Encounter (Signed)
Oral Oncology Patient Advocate Encounter  Received notification from [facility] that prior authorization for Adam Reyes is required.  PA submitted on CoverMyMeds Key [id number] Status is pending  Oral Oncology Clinic will continue to follow.

## 2018-04-21 NOTE — Progress Notes (Signed)
PCP: Asencion Noble, MD  Clinic Note: Chief Complaint  Patient presents with  . Follow-up    No recurrent angina  . Coronary Artery Disease    Moderate.  Medical management  . Loss of Consciousness    Related to heat and dehydration.    HPI: Adam Reyes is a 62 y.o. male with a PMH notable for MI-CAD Med Rx returning today for 6 month f/u.  MI hx:  subtle possible ST elevation MI versus non-STEMI. -- Sx was "a little chest pain that did not go away & some shortness of breath".  -->  Cath showed moderate bifurcation LAD-D1 disease that was non-flow-limiting.  Plan was to treat medically.  Evaluated with Myoview stress test that was negative for ischemia.  Adam Reyes was then seen by me on September 19, 2017 --cardiac complaints.  Recent Hospitalizations: none since last d/c  Studies Personally Reviewed - (if available, images/films reviewed: From Epic Chart or Care Everywhere)  None  Interval History: Adam Reyes presents here today stating that he is pretty stable overall from a cardiac standpoint.  Actually he was doing remarkably well until last week when he was quite hot he was outside working, and overheated a little bit be somewhat dehydrated.  He stood up really quickly after being down for a little bit and passed out.  He denied any sensation of rapid irregular heartbeats palpitations.  He felt much better after he drank water.  Otherwise from my standpoint he is doing remarkably well without any chest tightness pressure with rest or exertion.  No PND, orthopnea or edema.  No TIA or amaurosis fugax symptoms.  No melena, hematochezia or hematuria.  Although he did have microscopic hematuria on recent urinalysis. Mild diffuse bruising but no real bleeding.  Cardiac review of symptoms:  Cardiovascular ROS: no chest pain or dyspnea on exertion positive for - loss of consciousness and Pretty much attributes this to heat, fatigue and dehydration. negative for - edema, irregular heartbeat,  murmur, orthopnea, palpitations, paroxysmal nocturnal dyspnea, rapid heart rate, shortness of breath or claudication.   ROS: A comprehensive was performed. Review of Systems  Constitutional: Negative for malaise/fatigue.  HENT: Negative for congestion and nosebleeds.   Respiratory: Negative for cough and shortness of breath.   Cardiovascular: Negative for claudication.  Gastrointestinal: Negative for abdominal pain, blood in stool, constipation and melena.  Genitourinary: Negative for dysuria and hematuria.  Musculoskeletal: Negative for falls and joint pain.  Neurological: Negative for dizziness and tremors.  Endo/Heme/Allergies: Bruises/bleeds easily.  Psychiatric/Behavioral: Negative for depression and memory loss. The patient is not nervous/anxious and does not have insomnia.   All other systems reviewed and are negative.  I have reviewed and (if needed) personally updated the patient's problem list, medications, allergies, past medical and surgical history, social and family history.   Past Medical History:  Diagnosis Date  . 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 Enlarged lymph node    left neck  . STEMI (ST elevation myocardial infarction) ([place]) 03/05/2017   hx/notes 03/05/2017 -likely aborted anterior STEMI with 50% bifurcation LAD-Diag1.  No PCI.  Preserved EF    Past Surgical History:  Procedure Laterality Date  . COLONOSCOPY N/A 10/11/2015   Procedure: COLONOSCOPY;  Surgeon: Rogene Houston, MD;  [facility];  Service: Endoscopy;  Laterality: N/A;  10/11/2015  . Graded Exercise Tolerance Test (GXT/ETT)  05/2017   10.7 METs (9: 25 min.  Reached 103% max predicted heart rate).  No EKG findings to suggest coronary ischemia.  Negative, low risk GXT  . LEFT HEART CATH AND CORONARY ANGIOGRAPHY N/A 03/05/2017    Procedure: LEFT HEART CATH AND CORONARY ANGIOGRAPHY;  Surgeon: Leonie Man, MD;  Location: [facility];  Service: Cardiovascular: pLAD-Diag1 50-55% (non-flow-limiiting).  EF ~50-55%.  although bifurcation lesion was presumed Culprit - no PCI (not flow limiting). - Med Rx.  [name] MASS BIOPSY Left 06/27/2015   Procedure: OPEN LEFT NECK BIOPSY ;  Surgeon: Leta Baptist, MD;  Location: [facility];  Service: ENT;  Laterality: Left;  . REFRACTIVE SURGERY Bilateral      Current Meds  Medication Sig  . aspirin 81 MG chewable tablet Chew 1 tablet (81 mg total) by mouth every other day.  [name] atorvastatin (LIPITOR) 80 MG tablet Take 1 tablet (80 mg total) by mouth daily at 6 PM.  . Ibrutinib 420 MG TABS Take 420 mg by mouth daily.  . metoprolol tartrate (LOPRESSOR) 25 MG tablet Take 0.5 tablets (12.5 mg total) by mouth 2 (two) times daily.  . naproxen sodium (ANAPROX) 550 MG tablet TK 1 T PO Q 12 H PRN  . nitroGLYCERIN (NITROSTAT) 0.4 MG SL tablet Place 1 tablet (0.4 mg total) under the tongue every 5 (five) minutes as needed for chest pain.  . [DISCONTINUED] metoprolol tartrate (LOPRESSOR) 25 MG tablet Take 1 tablet (25 mg total) by mouth 2 (two) times daily.    No Known Allergies  Social History   Tobacco Use  . Smoking status: Former Smoker    Years: 2.00    Types: Cigarettes  . Smokeless tobacco: Former Systems developer    Types: Chew, Snuff  . Tobacco comment: 03/06/2017 "quit smoking when I was young; quit chew/snuff in ~ 2008"  Substance Use Topics  . Alcohol use: Yes    Comment: 03/06/2017 "maybe 6 pack beer/month"  . Drug use: No   Social History   Social History Narrative  . Not on file    family history includes Cancer in his brother and father; Diabetes in his mother.  Wt Readings from Last 3 Encounters:  04/21/18 273 lb (123.8 kg)  03/26/18 267 lb 13.7 oz (121.5 kg)  03/13/18 270 lb 4.8 oz (122.6 kg)    PHYSICAL EXAM BP 126/70 (BP Location: Right Arm, Patient  Position: Sitting, Cuff Size: Large)   Pulse 69   Ht 6\' 4"  (1.93 m)   Wt 273 lb (123.8 kg)   BMI 33.23 kg/m  Physical Exam  Constitutional: He is oriented to person, place, and time. He appears well-developed and well-nourished. No distress.  Well-groomed.  Healthy-appearing  HENT:  Head: Normocephalic and atraumatic.  Eyes: EOM are normal.  Neck: Normal range of motion. Neck supple. No hepatojugular reflux and no JVD present. Carotid bruit is not present.  Cardiovascular: Normal rate, regular rhythm, normal heart sounds and normal pulses.  No extrasystoles are present. PMI is not displaced. Exam reveals no gallop and no friction rub.  No murmur heard. Pulmonary/Chest: Effort normal and breath sounds normal. No respiratory distress. He has no wheezes. He has no rales.  Abdominal: Soft.  Musculoskeletal: Normal range of motion. He exhibits no edema.  Neurological: He is alert and oriented to person, place, and time.  Skin: Skin is warm and dry.  Psychiatric: He has a normal mood and affect.  His behavior is normal. Judgment and thought content normal.  Nursing note and vitals reviewed.    Adult ECG Report NSR - 1 interpolated PVC.  Heart rate 69 bpm.  Otherwise normal axis, intervals durations.  Other studies Reviewed: Additional studies/ records that were reviewed today include:  Recent Labs:  Lab Results  Component Value Date   CHOL 89 (L) 06/11/2017   HDL 26 (L) 06/11/2017   LDLCALC 49 06/11/2017   TRIG 68 06/11/2017   CHOLHDL 3.4 06/11/2017    Lab Results  Component Value Date   CREATININE 1.05 03/26/2018   BUN 20 03/26/2018   NA 141 03/26/2018   K 4.0 03/26/2018   CL 105 03/26/2018   CO2 29 03/26/2018    ASSESSMENT / PLAN: Problem List Items Addressed This Visit    Coronary artery disease involving native coronary artery of native heart without angina pectoris - Primary (Chronic)    Continue medical management of moderate LAD-D1 bifurcation disease.  Nonischemic  GXT with no cardiac symptoms.  Continues to be on aspirin, statin and low-dose beta-blocker.  Since he is noticing a little bit of fatigue and heat sensitivity with his oncology medication, I would like to try to see if he can back down his beta-blocker to 12.5 mg twice daily  Not requiring PRN nitro      Relevant Medications   metoprolol tartrate (LOPRESSOR) 25 MG tablet   Other Relevant Orders   EKG 12-Lead   Dyslipidemia, goal LDL below 70 (Chronic)    Most recent labs from November 2018 looked excellent.  He is due to have labs checked today (in fact he just have it drawn.  Will wait await results. Otherwise continue statin.  If normal, can reduce atorvastatin down to 40 mg      Relevant Medications   metoprolol tartrate (LOPRESSOR) 25 MG tablet   Other Relevant Orders   EKG 12-Lead   Syncope due to orthostatic hypotension    I permission agree with his assessment that this has to do with standing up too quickly and being dehydrated and significant heat.  Very brief loss consciousness but no associated symptoms of palpitations or rapid irregular heartbeats.      Relevant Medications   metoprolol tartrate (LOPRESSOR) 25 MG tablet      Current medicines are reviewed at length with the patient today. (+/- concerns) bruising The following changes have been made:Taking aspirin every other day  Patient Instructions  Medication Instructions:   Decrease metoprolol tartrate 12.5 mg ( 1/2 tablet ) twice a day    If you need a refill on your cardiac medications before your next appointment, please call your pharmacy.   Lab work: not needed  If you have labs (blood work) drawn today and your tests are completely normal, you will receive your results only by: [name] MyChart Message (if you have MyChart) OR . A paper copy in the mail If you have any lab test that is abnormal or we need to change your treatment, we will call you to review the results.  Testing/Procedures: Not  needed  Follow-Up: At [facility], you and your health needs are our priority.  As part of our continuing mission to provide you with exceptional heart care, we have created designated Provider Care Teams.  These Care Teams include your primary Cardiologist (physician) and Advanced Practice Providers (APPs -  Physician Assistants and Nurse Practitioners) who all work together to provide you with the care you need, when you need it.  You will need a follow up appointment in 12 months.  Please call our office 2 months in advance to schedule this appointment.  You may see Glenetta Hew, MD or one of the following Advanced Practice Providers on your designated Care Team:   Rosaria Ferries, PA-C . Jory Sims, DNP, ANP  Any Other Special Instructions Will Be Listed Below (If Applicable).      Studies Ordered:   Orders Placed This Encounter  Procedures  . EKG 12-Lead      Glenetta Hew, M.D., M.S. Interventional Cardiologist   Pager # [phone] Phone # [phone] [address]. [place], [place] [address]    Thank you for choosing Heartcare at [facility]!!

## 2018-04-21 NOTE — Patient Instructions (Signed)
Medication Instructions:   Decrease metoprolol tartrate 12.5 mg ( 1/2 tablet ) twice a day    If you need a refill on your cardiac medications before your next appointment, please call your pharmacy.   Lab work: not needed  If you have labs (blood work) drawn today and your tests are completely normal, you will receive your results only by: [name] MyChart Message (if you have MyChart) OR . A paper copy in the mail If you have any lab test that is abnormal or we need to change your treatment, we will call you to review the results.  Testing/Procedures: Not needed  Follow-Up: At [facility], you and your health needs are our priority.  As part of our continuing mission to provide you with exceptional heart care, we have created designated Provider Care Teams.  These Care Teams include your primary Cardiologist (physician) and Advanced Practice Providers (APPs -  Physician Assistants and Nurse Practitioners) who all work together to provide you with the care you need, when you need it. You will need a follow up appointment in 12 months.  Please call our office 2 months in advance to schedule this appointment.  You may see Glenetta Hew, MD or one of the following Advanced Practice Providers on your designated Care Team:   Rosaria Ferries, PA-C . Jory Sims, DNP, ANP  Any Other Special Instructions Will Be Listed Below (If Applicable).

## 2018-04-21 NOTE — Telephone Encounter (Signed)
Oral Oncology Patient Advocate Encounter  Prior Authorization for Adam Reyes has been approved.    PA# [id number] Effective dates: 04/21/18 through 10/18/2018  Patients co-pay is $500.  Obtained copay card information to help reduce the out of pocket expense to $10.00  BIN: [id number] ID:  [id number] Group: [id number]  Oral Oncology Clinic will continue to follow.   [place] Patient [place] Phone [phone] Fax [phone] 04/21/2018 10:33 AM

## 2018-04-21 NOTE — Assessment & Plan Note (Signed)
Most recent labs from November 2018 looked excellent.  He is due to have labs checked today (in fact he just have it drawn.  Will wait await results. Otherwise continue statin.  If normal, can reduce atorvastatin down to 40 mg

## 2018-04-23 ENCOUNTER — Inpatient Hospital Stay (HOSPITAL_BASED_OUTPATIENT_CLINIC_OR_DEPARTMENT_OTHER): Payer: 59 | Admitting: Hematology

## 2018-04-23 ENCOUNTER — Encounter (HOSPITAL_COMMUNITY): Payer: Self-pay | Admitting: Hematology

## 2018-04-23 ENCOUNTER — Inpatient Hospital Stay (HOSPITAL_COMMUNITY): Payer: 59 | Attending: Hematology

## 2018-04-23 VITALS — BP 130/71 | HR 62 | Temp 97.4°F | Resp 18

## 2018-04-23 VITALS — BP 149/74 | HR 58 | Temp 97.8°F | Resp 20 | Wt 273.0 lb

## 2018-04-23 DIAGNOSIS — Z23 Encounter for immunization: Secondary | ICD-10-CM

## 2018-04-23 DIAGNOSIS — Z5112 Encounter for antineoplastic immunotherapy: Secondary | ICD-10-CM | POA: Diagnosis present

## 2018-04-23 DIAGNOSIS — C8300 Small cell B-cell lymphoma, unspecified site: Secondary | ICD-10-CM

## 2018-04-23 LAB — COMPREHENSIVE METABOLIC PANEL
ALK PHOS: 60 U/L (ref 38–126)
ALT: 34 U/L (ref 0–44)
AST: 32 U/L (ref 15–41)
Albumin: 4 g/dL (ref 3.5–5.0)
Anion gap: 8 (ref 5–15)
BUN: 20 mg/dL (ref 6–20)
CALCIUM: 8.9 mg/dL (ref 8.9–10.3)
CO2: 25 mmol/L (ref 22–32)
Chloride: 106 mmol/L (ref 98–111)
Creatinine, Ser: 1.03 mg/dL (ref 0.61–1.24)
Glucose, Bld: 203 mg/dL — ABNORMAL HIGH (ref 70–99)
Potassium: 3.9 mmol/L (ref 3.5–5.1)
Sodium: 139 mmol/L (ref 135–145)
Total Bilirubin: 1.6 mg/dL — ABNORMAL HIGH (ref 0.3–1.2)
Total Protein: 6.7 g/dL (ref 6.5–8.1)

## 2018-04-23 LAB — CBC WITH DIFFERENTIAL/PLATELET
Abs Immature Granulocytes: 0.08 10*3/uL — ABNORMAL HIGH (ref 0.00–0.07)
BASOS ABS: 0 10*3/uL (ref 0.0–0.1)
Basophils Relative: 1 %
EOS ABS: 0.1 10*3/uL (ref 0.0–0.5)
Eosinophils Relative: 2 %
HEMATOCRIT: 46.5 % (ref 39.0–52.0)
HEMOGLOBIN: 15.6 g/dL (ref 13.0–17.0)
IMMATURE GRANULOCYTES: 2 %
LYMPHS ABS: 0.8 10*3/uL (ref 0.7–4.0)
LYMPHS PCT: 14 %
MCH: 31 pg (ref 26.0–34.0)
MCHC: 33.5 g/dL (ref 30.0–36.0)
MCV: 92.3 fL (ref 80.0–100.0)
Monocytes Absolute: 0.4 10*3/uL (ref 0.1–1.0)
Monocytes Relative: 8 %
NEUTROS ABS: 3.9 10*3/uL (ref 1.7–7.7)
Neutrophils Relative %: 73 %
Platelets: 125 10*3/uL — ABNORMAL LOW (ref 150–400)
RBC: 5.04 MIL/uL (ref 4.22–5.81)
RDW: 12.2 % (ref 11.5–15.5)
WBC: 5.4 10*3/uL (ref 4.0–10.5)
nRBC: 0 % (ref 0.0–0.2)

## 2018-04-23 LAB — LACTATE DEHYDROGENASE: LDH: 149 U/L (ref 98–192)

## 2018-04-23 MED ORDER — ACETAMINOPHEN 325 MG PO TABS
650.0000 mg | ORAL_TABLET | Freq: Once | ORAL | Status: AC
  Administered 2018-04-23: 650 mg via ORAL

## 2018-04-23 MED ORDER — INFLUENZA VAC SPLIT QUAD 0.5 ML IM SUSY
0.5000 mL | PREFILLED_SYRINGE | Freq: Once | INTRAMUSCULAR | Status: AC
  Administered 2018-04-23: 0.5 mL via INTRAMUSCULAR
  Filled 2018-04-23: qty 0.5

## 2018-04-23 MED ORDER — HEPARIN SOD (PORK) LOCK FLUSH 100 UNIT/ML IV SOLN: 500.0000 [IU] | Freq: Once | INTRAVENOUS | Status: DC

## 2018-04-23 MED ORDER — SODIUM CHLORIDE 0.9 % IV SOLN
Freq: Once | INTRAVENOUS | Status: AC
  Administered 2018-04-23: 09:00:00 via INTRAVENOUS

## 2018-04-23 MED ORDER — ACETAMINOPHEN 325 MG PO TABS
ORAL_TABLET | ORAL | Status: AC
  Filled 2018-04-23: qty 2

## 2018-04-23 MED ORDER — FAMOTIDINE IN NACL 20-0.9 MG/50ML-% IV SOLN
20.0000 mg | Freq: Two times a day (BID) | INTRAVENOUS | Status: DC
  Administered 2018-04-23: 20 mg via INTRAVENOUS

## 2018-04-23 MED ORDER — DIPHENHYDRAMINE HCL 50 MG/ML IJ SOLN
INTRAMUSCULAR | Status: AC
  Filled 2018-04-23: qty 1

## 2018-04-23 MED ORDER — DIPHENHYDRAMINE HCL 50 MG/ML IJ SOLN
50.0000 mg | Freq: Once | INTRAMUSCULAR | Status: AC
  Administered 2018-04-23: 50 mg via INTRAVENOUS

## 2018-04-23 MED ORDER — SODIUM CHLORIDE 0.9% FLUSH
3.0000 mL | INTRAVENOUS | Status: DC | PRN
  Administered 2018-04-23: 3 mL
  Filled 2018-04-23: qty 10

## 2018-04-23 MED ORDER — SODIUM CHLORIDE 0.9 % IV SOLN
500.0000 mg/m2 | Freq: Once | INTRAVENOUS | Status: AC
  Administered 2018-04-23: 1300 mg via INTRAVENOUS
  Filled 2018-04-23: qty 100

## 2018-04-23 MED ORDER — TETANUS-DIPHTH-ACELL PERTUSSIS 5-2.5-18.5 LF-MCG/0.5 IM SUSP
0.5000 mL | Freq: Once | INTRAMUSCULAR | Status: AC
  Administered 2018-04-23: 0.5 mL via INTRAMUSCULAR
  Filled 2018-04-23: qty 0.5

## 2018-04-23 MED ORDER — FAMOTIDINE IN NACL 20-0.9 MG/50ML-% IV SOLN
INTRAVENOUS | Status: AC
  Filled 2018-04-23: qty 50

## 2018-04-23 NOTE — Progress Notes (Signed)
0845 labs reviewed and patient seen by Dr. Delton Coombes who approved patient for treatment today. Per MD, also give Tetanus shot and influenza vaccine per pt request.  [name] tolerated Rituxan infusion without incident or complaint. VSS upon completion of treatment. Peripheral IV noted for positive blood return by 2 RN's prior to, during, and after infusion completed. Influenza vaccine and tetanus shot administered per order. Pt discharged self ambulatory in satisfactory condition in presence of friend.

## 2018-04-23 NOTE — Assessment & Plan Note (Signed)
1.  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  No evidence of transformation. 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  - Bone marrow aspirate and flow cytometry positive for lymphoma involvement, chromosome analysis normal, CLL FISH panel normal - Plavix was discontinued after contacting his cardiologist Dr. Ellyn Hack -Ibrutinib 420 mg daily started on 12/01/2017, developed skin rash and ibrutinib was held on 12/13/2017 through 12/16/2017.  Rash improved with steroids and Ibrutinib was restarted back at the same dose on 12/16/2017.  He did not get any further rash.  No other side effects were noted. -Rituximab every 4 weeks started with cycle 2.  This will be continued through cycle 7 for 6 doses.  - CT CAP on 03/11/2018 showed response to therapy with decreasing size of nodes in the chest, abdomen and pelvis.  Spleen size also improved.  Decreasing size of the extranodal soft tissue mass of the right flank previously 2.5 cm, now 1.3 cm. - He will proceed with cycle 5 of rituximab today. - He is continuing to tolerate ibrutinib very well.  Minor bruising with no bleeding present. -He was reportedly told at [employer] physical that there was microscopic blood in the urine.  He does not report any gross hematuria.  Ibrutinib can cause hematuria although infrequent. -Today's physical examination did not reveal any major lymphadenopathy or splenomegaly.  He will be seen back in 4 weeks for follow-up.

## 2018-04-23 NOTE — Telephone Encounter (Signed)
Oral Oncology Patient Advocate Encounter  Called patient and verified he did receive his medication yesterday.  [place] Patient [place] Phone [phone] Fax [phone] 04/23/2018 10:08 AM

## 2018-04-23 NOTE — Patient Instructions (Signed)
[facility] Discharge Instructions for Patients Receiving Chemotherapy   Beginning January 23rd 2017 lab work for the Cancer Center will be done in the  Main lab at [place] on 1st floor. If you have a lab appointment with the Cancer Center please come in thru the  Main Entrance and check in at the main information desk   Today you received the following chemotherapy agents Rituxan  To help prevent nausea and vomiting after your treatment, we encourage you to take your nausea medication   If you develop nausea and vomiting, or diarrhea that is not controlled by your medication, call the clinic.  The clinic phone number is [phone]. Office hours are Monday-Friday 8:30am-5:00pm.  BELOW ARE SYMPTOMS THAT SHOULD BE REPORTED IMMEDIATELY:  *FEVER GREATER THAN 101.0 F  *CHILLS WITH OR WITHOUT FEVER  NAUSEA AND VOMITING THAT IS NOT CONTROLLED WITH YOUR NAUSEA MEDICATION  *UNUSUAL SHORTNESS OF BREATH  *UNUSUAL BRUISING OR BLEEDING  TENDERNESS IN MOUTH AND THROAT WITH OR WITHOUT PRESENCE OF ULCERS  *URINARY PROBLEMS  *BOWEL PROBLEMS  UNUSUAL RASH Items with * indicate a potential emergency and should be followed up as soon as possible. If you have an emergency after office hours please contact your primary care physician or go to the nearest emergency department.  Please call the clinic during office hours if you have any questions or concerns.   You may also contact the Patient Navigator at [phone] should you have any questions or need assistance in obtaining follow up care.      Resources For Cancer Patients and their Caregivers ? American Cancer Society: Can assist with transportation, wigs, general needs, runs Look Good Feel Better.        1-888-227-6333 ? Cancer Care: Provides financial assistance, online support groups, medication/co-pay assistance.  1-800-813-HOPE (4673) ? [facility] Assists [place] cancer  patients and their families through emotional , educational and financial support.  336-427-4357 ? [place] DSS Where to apply for food stamps, Medicaid and utility assistance. 336-342-1394 ? [facility]: Transportation to medical appointments. 336-347-2287 ? Social Security Administration: May apply for disability if have a Stage IV cancer. 336-342-7796 1-800-772-1213 ? [place] Aging, Disability and Transit Services: Assists with nutrition, care and transit needs. 336-349-2343          

## 2018-04-23 NOTE — Progress Notes (Signed)
Adam Reyes, [place] [address]   CLINIC:  Medical Oncology/Hematology  PCP:  Asencion Noble, MD [address] [place] [address] [phone]   REASON FOR VISIT: Follow-up for Chronic lymphocytic leukemia  CURRENT THERAPY: Ibrutinib 420 mg daily and Rituximab once a month   BRIEF ONCOLOGIC HISTORY:    Chronic lymphocytic leukemia (CLL), B-cell ([facility])   06/14/2015 Imaging    CT neck- Bulky adenopathy throughout the neck bilaterally. There also are enlarged parotid lymph nodes bilaterally. There is bilateral axillary adenopathy as well as mediastinal adenopathy. Findings are consistent with lymphoma. Biopsy recommended.    06/27/2015 Procedure    Left neck lymph node biopsy by Dr. Benjamine Mola    06/27/2015 Pathology Results    Diagnosis Lymph node for lymphoma, Left neck node for lymphoma work up - [place].  LOW GRADE.    06/27/2015 Pathology Results    Tissue-Flow Cytometry - MONOCLONAL B CELL POPULATION IDENTIFIED. The phenotypic features are consistent with small lymphocytic lymphoma/chronic lymphocytic leukemia and correlate well with the morphology in the lymph node     Lymphoma, small lymphocytic ([facility])   10/14/2017 Initial Diagnosis    Lymphoma, small lymphocytic ([facility])    12/25/2017 -  Chemotherapy    The patient had riTUXimab (RITUXAN) 900 mg in sodium chloride 0.9 % 250 mL (2.6471 mg/mL) infusion, 375 mg/m2 = 900 mg, Intravenous,  Once, 5 of 6 cycles Dose modification: 500 mg/m2 (original dose 500 mg/m2, Cycle 2, Reason: Provider Judgment) Administration: 900 mg (01/01/2018), 1,300 mg (01/29/2018), 1,300 mg (02/26/2018), 1,300 mg (03/26/2018)  for chemotherapy treatment.       INTERVAL HISTORY:  Adam Reyes 62 y.o. male returns for routine follow-up chronic lymphocytic leukemia. Patient is here today with his wife. He is doing well with his treatment. He had an episode of passing out while out working outside in the  heat. He saw his Cardiologist and he decreased his metoprolol. He reports a mild rash on his back. His back itches a lot and the rash appeared to be scratch marks. His appetite is 100% and he has no problem maintaining his weight. He denies any fevers, night sweats, or chills. Denies any nausea, vomiting, or diarrhea. Denies any new pains or lumps.       REVIEW OF SYSTEMS:  Review of Systems  Skin: Positive for rash.  All other systems reviewed and are negative.    PAST MEDICAL/SURGICAL HISTORY:  Past Medical History:  Diagnosis Date  . 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 Enlarged lymph node    left neck  . STEMI (ST elevation myocardial infarction) ([facility]) 03/05/2017   hx/notes 03/05/2017 -likely aborted anterior STEMI with 50% bifurcation LAD-Diag1.  No PCI.  Preserved EF   Past Surgical History:  Procedure Laterality Date  . COLONOSCOPY N/A 10/11/2015   Procedure: COLONOSCOPY;  Surgeon: Rogene Houston, MD;  [facility];  Service: Endoscopy;  Laterality: N/A;  10/11/2015  . Graded Exercise Tolerance Test (GXT/ETT)  05/2017   10.7 METs (9: 25 min.  Reached 103% max predicted heart rate).  No EKG findings to suggest coronary ischemia.  Negative, low risk GXT  . LEFT HEART CATH AND CORONARY ANGIOGRAPHY N/A 03/05/2017   Procedure: LEFT HEART CATH AND CORONARY ANGIOGRAPHY;  Surgeon: Leonie Man, MD;  Location: [facility];  Service: Cardiovascular: pLAD-Diag1 50-55% (non-flow-limiiting).  EF ~50-55%.  although bifurcation lesion was presumed Culprit - no PCI (not flow limiting). - Med Rx.  [name] MASS BIOPSY Left 06/27/2015   Procedure: OPEN LEFT NECK BIOPSY ;  Surgeon: Leta Baptist, MD;  Location: [facility];  Service: ENT;  Laterality: Left;  . REFRACTIVE SURGERY Bilateral      SOCIAL HISTORY:  Social  History   Socioeconomic History  . Marital status: Married    Spouse name: Not on file  . Number of children: Not on file  . Years of education: Not on file  . Highest education level: Not on file  Occupational History  . Not on file  Social Needs  . Financial resource strain: Not on file  . Food insecurity:    Worry: Not on file    Inability: Not on file  . Transportation needs:    Medical: Not on file    Non-medical: Not on file  Tobacco Use  . Smoking status: Former Smoker    Years: 2.00    Types: Cigarettes  . Smokeless tobacco: Former Systems developer    Types: Chew, Snuff  . Tobacco comment: 03/06/2017 "quit smoking when I was young; quit chew/snuff in ~ 2008"  Substance and Sexual Activity  . Alcohol use: Yes    Comment: 03/06/2017 "maybe 6 pack beer/month"  . Drug use: No  . Sexual activity: Yes  Lifestyle  . Physical activity:    Days per week: Not on file    Minutes per session: Not on file  . Stress: Not on file  Relationships  . Social connections:    Talks on phone: Not on file    Gets together: Not on file    Attends religious service: Not on file    Active member of club or organization: Not on file    Attends meetings of clubs or organizations: Not on file    Relationship status: Not on file  . Intimate partner violence:    Fear of current or ex partner: Not on file    Emotionally abused: Not on file    Physically abused: Not on file    Forced sexual activity: Not on file  Other Topics Concern  . Not on file  Social History Narrative  . Not on file    FAMILY HISTORY:  Family History  Problem Relation Age of Onset  . Diabetes Mother   . Cancer Father   . Cancer Brother     CURRENT MEDICATIONS:  Outpatient Encounter Medications as of 04/23/2018  Medication Sig  . aspirin 81 MG chewable tablet Chew 1 tablet (81 mg total) by mouth every other day.  [name] atorvastatin (LIPITOR) 80 MG tablet Take 1 tablet (80 mg total) by mouth daily at 6 PM.  . Ibrutinib  420 MG TABS Take 420 mg by mouth daily.  . metoprolol tartrate (LOPRESSOR) 25 MG tablet Take 0.5 tablets (12.5 mg total) by mouth 2 (two) times daily.  . naproxen sodium (ANAPROX) 550 MG tablet TK 1 T PO Q 12 H PRN  . nitroGLYCERIN (NITROSTAT) 0.4 MG SL tablet Place 1 tablet (0.4 mg total) under the tongue every 5 (five) minutes as needed for chest pain.  . vitamin C (ASCORBIC ACID) 250 MG tablet Take by mouth.   No facility-administered encounter medications on file as of 04/23/2018.     ALLERGIES:  No Known Allergies   PHYSICAL EXAM:  ECOG Performance status: 1  Vitals:   04/23/18 0828  BP: (!) 149/74  Pulse: (!) 58  Resp: 20  Temp: 97.8 F (36.6 C)  SpO2: 99%   Filed Weights   04/23/18 0828  Weight: 273 lb (123.8 kg)    Physical Exam  Constitutional: He is oriented to person, place, and time. He appears well-developed and well-nourished.  Neck: Normal range of motion. Neck supple.  Cardiovascular: Normal rate, regular rhythm and normal heart sounds.  Pulmonary/Chest: Effort normal and breath sounds normal.  Abdominal: Soft.  Musculoskeletal: Normal range of motion.  Neurological: He is alert and oriented to person, place, and time.  Skin: Skin is warm and dry.  Psychiatric: He has a normal mood and affect. His behavior is normal. Judgment and thought content normal.  No significant lymphadenopathy in the neck, axillary or inguinal region.  No palpable splenomegaly.   LABORATORY DATA:  I have reviewed the labs as listed.  CBC    Component Value Date/Time   WBC 5.4 04/23/2018 0807   RBC 5.04 04/23/2018 0807   HGB 15.6 04/23/2018 0807   HCT 46.5 04/23/2018 0807   PLT 125 (L) 04/23/2018 0807   MCV 92.3 04/23/2018 0807   MCH 31.0 04/23/2018 0807   MCHC 33.5 04/23/2018 0807   RDW 12.2 04/23/2018 0807   LYMPHSABS 0.8 04/23/2018 0807   MONOABS 0.4 04/23/2018 0807   EOSABS 0.1 04/23/2018 0807   BASOSABS 0.0 04/23/2018 0807   CMP Latest Ref Rng & Units  04/23/2018 04/21/2018 03/26/2018  Glucose 70 - 99 mg/dL 203(H) - 191(H)  BUN 6 - 20 mg/dL 20 - 20  Creatinine 0.61 - 1.24 mg/dL 1.03 - 1.05  Sodium 135 - 145 mmol/L 139 - 141  Potassium 3.5 - 5.1 mmol/L 3.9 - 4.0  Chloride 98 - 111 mmol/L 106 - 105  CO2 22 - 32 mmol/L 25 - 29  Calcium 8.9 - 10.3 mg/dL 8.9 - 9.0  Total Protein 6.5 - 8.1 g/dL 6.7 6.2 6.6  Total Bilirubin 0.3 - 1.2 mg/dL 1.6(H) 1.1 1.4(H)  Alkaline Phos 38 - 126 U/L 60 63 63  AST 15 - 41 U/L 32 27 32  ALT 0 - 44 U/L 34 29 26        ASSESSMENT & PLAN:   Lymphoma, small lymphocytic ([facility]) 1.  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  No evidence of transformation. 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  - Bone marrow aspirate and flow cytometry positive for lymphoma involvement, chromosome analysis normal, CLL FISH panel normal - Plavix was discontinued after contacting his cardiologist Dr. Ellyn Hack -Ibrutinib 420 mg daily started on 12/01/2017, developed skin rash and ibrutinib was held on 12/13/2017 through 12/16/2017.  Rash improved with steroids and Ibrutinib was restarted back at the same dose on 12/16/2017.  He did not get any further rash.  No other side effects were noted. -Rituximab every 4 weeks started with cycle 2.  This will be continued through cycle 7 for 6 doses.  - CT CAP on 03/11/2018 showed response to therapy with decreasing size of nodes in the chest, abdomen and pelvis.  Spleen size also improved.  Decreasing size of the extranodal soft tissue mass of the right flank previously 2.5 cm, now 1.3 cm. - He will proceed with cycle 5 of  rituximab today. - He is continuing to tolerate ibrutinib very well.  Minor bruising with no bleeding present. -He was reportedly told at [employer] physical that there was  microscopic blood in the urine.  He does not report any gross hematuria.  Ibrutinib can cause hematuria although infrequent. -Today's physical examination did not reveal any major lymphadenopathy or splenomegaly.  He will be seen back in 4 weeks for follow-up.       Orders placed this encounter:  Orders Placed This Encounter  Procedures  . Lactate dehydrogenase  . CBC with Differential/Platelet  . Comprehensive metabolic panel      Derek Jack, MD [place] [phone]

## 2018-04-23 NOTE — Patient Instructions (Signed)
[facility] at [facility] Discharge Instructions     Thank you for choosing [facility] at [facility] to provide your oncology and hematology care.  To afford each patient quality time with our provider, please arrive at least 15 minutes before your scheduled appointment time.   If you have a lab appointment with the Cancer Center please come in thru the  Main Entrance and check in at the main information desk  You need to re-schedule your appointment should you arrive 10 or more minutes late.  We strive to give you quality time with our providers, and arriving late affects you and other patients whose appointments are after yours.  Also, if you no show three or more times for appointments you may be dismissed from the clinic at the providers discretion.     Again, thank you for choosing [facility].  Our hope is that these requests will decrease the amount of time that you wait before being seen by our physicians.       _____________________________________________________________  Should you have questions after your visit to [facility], please contact our office at [phone] between the hours of 8:00 a.m. and 4:30 p.m.  Voicemails left after 4:00 p.m. will not be returned until the following business day.  For prescription refill requests, have your pharmacy contact our office and allow 72 hours.    Cancer Center Support Programs:   > Cancer Support Group  2nd Tuesday of the month 1pm-2pm, Journey Room    

## 2018-05-01 ENCOUNTER — Telehealth: Payer: Self-pay | Admitting: *Deleted

## 2018-05-01 DIAGNOSIS — Z79899 Other long term (current) drug therapy: Secondary | ICD-10-CM

## 2018-05-01 DIAGNOSIS — I2510 Atherosclerotic heart disease of native coronary artery without angina pectoris: Secondary | ICD-10-CM

## 2018-05-01 DIAGNOSIS — E785 Hyperlipidemia, unspecified: Secondary | ICD-10-CM

## 2018-05-01 MED ORDER — ATORVASTATIN CALCIUM 80 MG PO TABS: 40.0000 mg | ORAL_TABLET | Freq: Every day | ORAL | 2 refills | Status: DC

## 2018-05-01 NOTE — Telephone Encounter (Signed)
-----   Message from Leonie Man, MD sent at 04/30/2018  5:09 PM EDT ----- Normal liver function tests. Cholesterol level: Stable numbers.  Total cholesterol 96, triglycerides 66, HDL 38 (notable improvement).  LDL 45.  Other than the HDL improving however the also stable.  At this point I think we can reduce atorvastatin to 1/2 tablet.  Recheck lipid panel and chemistries In 6 months.  Glenetta Hew, MD\

## 2018-05-01 NOTE — Telephone Encounter (Signed)
The patient has been notified of the result and verbalized understanding.  All questions (if any) were answered.  rOUTED RESULT TO PRIMARY DECREASE ATORVASTATIN AND ORDER PLACED FOR LAB @ 6 MONTHS

## 2018-05-18 MED FILL — IMBRUVICA 420 MG TAB: 420 | 28 days supply | Qty: 28 | Fill #1

## 2018-05-21 ENCOUNTER — Encounter (HOSPITAL_COMMUNITY): Payer: Self-pay

## 2018-05-21 ENCOUNTER — Encounter (HOSPITAL_COMMUNITY): Payer: Self-pay | Admitting: Hematology

## 2018-05-21 ENCOUNTER — Inpatient Hospital Stay (HOSPITAL_COMMUNITY): Payer: 59

## 2018-05-21 ENCOUNTER — Other Ambulatory Visit: Payer: Self-pay

## 2018-05-21 ENCOUNTER — Inpatient Hospital Stay (HOSPITAL_BASED_OUTPATIENT_CLINIC_OR_DEPARTMENT_OTHER): Payer: 59 | Admitting: Hematology

## 2018-05-21 ENCOUNTER — Inpatient Hospital Stay (HOSPITAL_COMMUNITY): Payer: 59 | Attending: Hematology

## 2018-05-21 VITALS — BP 136/69 | HR 59 | Temp 97.9°F | Resp 16 | Wt 274.0 lb

## 2018-05-21 DIAGNOSIS — Z5112 Encounter for antineoplastic immunotherapy: Secondary | ICD-10-CM | POA: Diagnosis present

## 2018-05-21 DIAGNOSIS — C9110 Chronic lymphocytic leukemia of B-cell type not having achieved remission: Secondary | ICD-10-CM | POA: Insufficient documentation

## 2018-05-21 DIAGNOSIS — C8300 Small cell B-cell lymphoma, unspecified site: Secondary | ICD-10-CM

## 2018-05-21 LAB — COMPREHENSIVE METABOLIC PANEL
ALBUMIN: 4.2 g/dL (ref 3.5–5.0)
ALK PHOS: 58 U/L (ref 38–126)
ALT: 28 U/L (ref 0–44)
ANION GAP: 7 (ref 5–15)
AST: 29 U/L (ref 15–41)
BILIRUBIN TOTAL: 1.7 mg/dL — AB (ref 0.3–1.2)
BUN: 19 mg/dL (ref 6–20)
CALCIUM: 9.1 mg/dL (ref 8.9–10.3)
CO2: 26 mmol/L (ref 22–32)
Chloride: 106 mmol/L (ref 98–111)
Creatinine, Ser: 1 mg/dL (ref 0.61–1.24)
GFR calc non Af Amer: >60 mL/min (ref >60)
Glucose, Bld: 124 mg/dL — ABNORMAL HIGH (ref 70–99)
Potassium: 3.7 mmol/L (ref 3.5–5.1)
SODIUM: 139 mmol/L (ref 135–145)
TOTAL PROTEIN: 6.7 g/dL (ref 6.5–8.1)

## 2018-05-21 LAB — CBC WITH DIFFERENTIAL/PLATELET
Abs Immature Granulocytes: 0.09 10*3/uL — ABNORMAL HIGH (ref 0.00–0.07)
BASOS ABS: 0.1 10*3/uL (ref 0.0–0.1)
Basophils Relative: 1 %
EOS ABS: 0.1 10*3/uL (ref 0.0–0.5)
Eosinophils Relative: 2 %
HEMATOCRIT: 46.5 % (ref 39.0–52.0)
Hemoglobin: 15.7 g/dL (ref 13.0–17.0)
IMMATURE GRANULOCYTES: 2 %
LYMPHS PCT: 12 %
Lymphs Abs: 0.7 10*3/uL (ref 0.7–4.0)
MCH: 30.6 pg (ref 26.0–34.0)
MCHC: 33.8 g/dL (ref 30.0–36.0)
MCV: 90.6 fL (ref 80.0–100.0)
Monocytes Absolute: 0.6 10*3/uL (ref 0.1–1.0)
Monocytes Relative: 10 %
NEUTROS PCT: 73 %
NRBC: 0 % (ref 0.0–0.2)
Neutro Abs: 4.4 10*3/uL (ref 1.7–7.7)
Platelets: 133 10*3/uL — ABNORMAL LOW (ref 150–400)
RBC: 5.13 MIL/uL (ref 4.22–5.81)
RDW: 12.3 % (ref 11.5–15.5)
WBC: 6 10*3/uL (ref 4.0–10.5)

## 2018-05-21 LAB — LACTATE DEHYDROGENASE: LDH: 166 U/L (ref 98–192)

## 2018-05-21 MED ORDER — ACETAMINOPHEN 325 MG PO TABS
ORAL_TABLET | ORAL | Status: AC
  Filled 2018-05-21: qty 2

## 2018-05-21 MED ORDER — DIPHENHYDRAMINE HCL 50 MG/ML IJ SOLN
INTRAMUSCULAR | Status: AC
  Filled 2018-05-21: qty 1

## 2018-05-21 MED ORDER — ACETAMINOPHEN 325 MG PO TABS
650.0000 mg | ORAL_TABLET | Freq: Once | ORAL | Status: AC
  Administered 2018-05-21: 650 mg via ORAL

## 2018-05-21 MED ORDER — DIPHENHYDRAMINE HCL 50 MG/ML IJ SOLN
50.0000 mg | Freq: Once | INTRAMUSCULAR | Status: AC
  Administered 2018-05-21: 50 mg via INTRAVENOUS

## 2018-05-21 MED ORDER — SODIUM CHLORIDE 0.9 % IV SOLN
500.0000 mg/m2 | Freq: Once | INTRAVENOUS | Status: AC
  Administered 2018-05-21: 1300 mg via INTRAVENOUS
  Filled 2018-05-21: qty 100

## 2018-05-21 MED ORDER — SODIUM CHLORIDE 0.9 % IV SOLN
Freq: Once | INTRAVENOUS | Status: AC
  Administered 2018-05-21: 10:00:00 via INTRAVENOUS

## 2018-05-21 MED ORDER — FAMOTIDINE IN NACL 20-0.9 MG/50ML-% IV SOLN
INTRAVENOUS | Status: AC
  Filled 2018-05-21: qty 50

## 2018-05-21 MED ORDER — FAMOTIDINE IN NACL 20-0.9 MG/50ML-% IV SOLN
20.0000 mg | Freq: Two times a day (BID) | INTRAVENOUS | Status: DC
  Administered 2018-05-21: 20 mg via INTRAVENOUS

## 2018-05-21 NOTE — Patient Instructions (Signed)
[facility] at [facility] Discharge Instructions  Follow up in 6 weeks with labs and scans   Thank you for choosing [facility] at [facility] to provide your oncology and hematology care.  To afford each patient quality time with our provider, please arrive at least 15 minutes before your scheduled appointment time.   If you have a lab appointment with the Cancer Center please come in thru the  Main Entrance and check in at the main information desk  You need to re-schedule your appointment should you arrive 10 or more minutes late.  We strive to give you quality time with our providers, and arriving late affects you and other patients whose appointments are after yours.  Also, if you no show three or more times for appointments you may be dismissed from the clinic at the providers discretion.     Again, thank you for choosing [facility].  Our hope is that these requests will decrease the amount of time that you wait before being seen by our physicians.       _____________________________________________________________  Should you have questions after your visit to  Cancer Center, please contact our office at [phone] between the hours of 8:00 a.m. and 4:30 p.m.  Voicemails left after 4:00 p.m. will not be returned until the following business day.  For prescription refill requests, have your pharmacy contact our office and allow 72 hours.    Cancer Center Support Programs:   > Cancer Support Group  2nd Tuesday of the month 1pm-2pm, Journey Room    

## 2018-05-21 NOTE — Progress Notes (Signed)
Pt presents today for Tituxumab treatment. BP elevated slightly. Reported to Dr. Delton Coombes. No complaints at this time. No significant changes since last visit per pt's words.  Per Dr. Tomie China VO proceed with treatment today.    Clarification by patient that he is taking his Ibrutinib 420mg 's daily. No missed doses or issues at this time.

## 2018-05-21 NOTE — Progress Notes (Signed)
[name], [name] [address]   CLINIC:  Medical Oncology/Hematology  PCP:  Adam Noble, MD [address] [place] [address] [phone]   REASON FOR VISIT: Follow-up for chronic lymphocytic leukemia (CLL)  CURRENT THERAPY: Ibrutinib 420 mg daily and this his last treatment of Rituximab   BRIEF ONCOLOGIC HISTORY:    Chronic lymphocytic leukemia (CLL), B-cell (Adam Reyes)   06/14/2015 Imaging    CT neck- Bulky adenopathy throughout the neck bilaterally. There also are enlarged parotid lymph nodes bilaterally. There is bilateral axillary adenopathy as well as mediastinal adenopathy. Findings are consistent with lymphoma. Biopsy recommended.    06/27/2015 Procedure    Left neck lymph node biopsy by Dr. Benjamine Reyes    06/27/2015 Pathology Results    Diagnosis Lymph node for lymphoma, Left neck node for lymphoma work up - [place].  LOW GRADE.    06/27/2015 Pathology Results    Tissue-Flow Cytometry - MONOCLONAL B CELL POPULATION IDENTIFIED. The phenotypic features are consistent with small lymphocytic lymphoma/chronic lymphocytic leukemia and correlate well with the morphology in the lymph node     Lymphoma, small lymphocytic ([facility])   10/14/2017 Initial Diagnosis    Lymphoma, small lymphocytic ([facility])    12/25/2017 -  Chemotherapy    The patient had riTUXimab (RITUXAN) 900 mg in sodium chloride 0.9 % 250 mL (2.6471 mg/mL) infusion, 375 mg/m2 = 900 mg, Intravenous,  Once, 6 of 6 cycles Dose modification: 500 mg/m2 (original dose 500 mg/m2, Cycle 2, Reason: Provider Judgment) Administration: 900 mg (01/01/2018), 1,300 mg (01/29/2018), 1,300 mg (02/26/2018), 1,300 mg (03/26/2018), 1,300 mg (04/23/2018)  for chemotherapy treatment.       INTERVAL HISTORY:  Adam Reyes 62 y.o. male returns for routine follow-up for chronic lymphocytic leukemia. Patient is here today with his wife. He is doing well today. He still has a light rash on his  abdomen and back. It is stable and unchanged at this time. He reports it itches at night. He also bruising easier than before. He hit his leg at work and still has a knot on his right lower leg. The bruising has slowly disappeared. He denies any nausea, vomiting, or diarrhea. Denies any fevers, night sweats, chills, and weight loss. Denies any headaches. Denies any numbness or tingling in his hands or feet. He reports his appetite at 100% and his energy level at 75%. He is still working full time with no issues.     REVIEW OF SYSTEMS:  Review of Systems  Skin: Positive for rash.  Hematological: Bruises/bleeds easily.  All other systems reviewed and are negative.    PAST MEDICAL/SURGICAL HISTORY:  Past Medical History:  Diagnosis Date  . 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 Enlarged lymph node    left neck  . STEMI (ST elevation myocardial infarction) ([facility]) 03/05/2017   hx/notes 03/05/2017 -likely aborted anterior STEMI with 50% bifurcation LAD-Diag1.  No PCI.  Preserved EF   Past Surgical History:  Procedure Laterality Date  . COLONOSCOPY N/A 10/11/2015   Procedure: COLONOSCOPY;  Surgeon: Rogene Houston, MD;  [facility];  Service: Endoscopy;  Laterality: N/A;  10/11/2015  . Graded Exercise Tolerance Test (GXT/ETT)  05/2017   10.7 METs (9: 25 min.  Reached 103% max predicted heart rate).  No EKG findings to suggest  coronary ischemia.  Negative, low risk GXT  . LEFT HEART CATH AND CORONARY ANGIOGRAPHY N/A 03/05/2017   Procedure: LEFT HEART CATH AND CORONARY ANGIOGRAPHY;  Surgeon: Leonie Man, MD;  Location: [facility];  Service: Cardiovascular: pLAD-Diag1 50-55% (non-flow-limiiting).  EF ~50-55%.  although bifurcation lesion was presumed Culprit - no PCI (not flow limiting). - Med Rx.  [name] MASS BIOPSY Left  06/27/2015   Procedure: OPEN LEFT NECK BIOPSY ;  Surgeon: Leta Baptist, MD;  Location: [facility];  Service: ENT;  Laterality: Left;  . REFRACTIVE SURGERY Bilateral      SOCIAL HISTORY:  Social History   Socioeconomic History  . Marital status: Married    Spouse name: Not on file  . Number of children: Not on file  . Years of education: Not on file  . Highest education level: Not on file  Occupational History  . Not on file  Social Needs  . Financial resource strain: Not on file  . Food insecurity:    Worry: Not on file    Inability: Not on file  . Transportation needs:    Medical: Not on file    Non-medical: Not on file  Tobacco Use  . Smoking status: Former Smoker    Years: 2.00    Types: Cigarettes  . Smokeless tobacco: Former Systems developer    Types: Chew, Snuff  . Tobacco comment: 03/06/2017 "quit smoking when I was young; quit chew/snuff in ~ 2008"  Substance and Sexual Activity  . Alcohol use: Yes    Comment: 03/06/2017 "maybe 6 pack beer/month"  . Drug use: No  . Sexual activity: Yes  Lifestyle  . Physical activity:    Days per week: Not on file    Minutes per session: Not on file  . Stress: Not on file  Relationships  . Social connections:    Talks on phone: Not on file    Gets together: Not on file    Attends religious service: Not on file    Active member of club or organization: Not on file    Attends meetings of clubs or organizations: Not on file    Relationship status: Not on file  . Intimate partner violence:    Fear of current or ex partner: Not on file    Emotionally abused: Not on file    Physically abused: Not on file    Forced sexual activity: Not on file  Other Topics Concern  . Not on file  Social History Narrative  . Not on file    FAMILY HISTORY:  Family History  Problem Relation Age of Onset  . Diabetes Mother   . Cancer Father   . Cancer Brother     CURRENT MEDICATIONS:  Outpatient Encounter Medications as of 05/21/2018    Medication Sig  . aspirin 81 MG chewable tablet Chew 1 tablet (81 mg total) by mouth every other day.  [name] atorvastatin (LIPITOR) 80 MG tablet Take 0.5 tablets (40 mg total) by mouth daily at 6 PM.  . Ibrutinib 420 MG TABS Take 420 mg by mouth daily.  . metoprolol tartrate (LOPRESSOR) 25 MG tablet Take 0.5 tablets (12.5 mg total) by mouth 2 (two) times daily.  . naproxen sodium (ANAPROX) 550 MG tablet TK 1 T PO Q 12 H PRN  . nitroGLYCERIN (NITROSTAT) 0.4 MG SL tablet Place 1 tablet (0.4 mg total) under the tongue every 5 (five) minutes as needed for chest pain.  . vitamin C (ASCORBIC ACID) 250  MG tablet Take by mouth.   No facility-administered encounter medications on file as of 05/21/2018.     ALLERGIES:  No Known Allergies   PHYSICAL EXAM:  ECOG Performance status: 1  VITAL SIGNS:BP: 152/72, P:66, R:17, T:97.8, SATS:96% VQQVZD:638  Physical Exam  Constitutional: He is oriented to person, place, and time. He appears well-developed and well-nourished.  Cardiovascular: Normal rate, regular rhythm and normal heart sounds.  Pulmonary/Chest: Effort normal and breath sounds normal.  Musculoskeletal: Normal range of motion.  Neurological: He is alert and oriented to person, place, and time.  Skin: Skin is dry.  Psychiatric: He has a normal mood and affect. His behavior is normal. Judgment and thought content normal.  Skin: Scratch marks on the torso present.  No distinct rash.  No ecchymosis.   LABORATORY DATA:  I have reviewed the labs as listed.  CBC    Component Value Date/Time   WBC 6.0 05/21/2018 0810   RBC 5.13 05/21/2018 0810   HGB 15.7 05/21/2018 0810   HCT 46.5 05/21/2018 0810   PLT 133 (L) 05/21/2018 0810   MCV 90.6 05/21/2018 0810   MCH 30.6 05/21/2018 0810   MCHC 33.8 05/21/2018 0810   RDW 12.3 05/21/2018 0810   LYMPHSABS 0.7 05/21/2018 0810   MONOABS 0.6 05/21/2018 0810   EOSABS 0.1 05/21/2018 0810   BASOSABS 0.1 05/21/2018 0810   CMP Latest Ref Rng & Units  05/21/2018 04/23/2018 04/21/2018  Glucose 70 - 99 mg/dL 124(H) 203(H) -  BUN 6 - 20 mg/dL 19 20 -  Creatinine 0.61 - 1.24 mg/dL 1.00 1.03 -  Sodium 135 - 145 mmol/L 139 139 -  Potassium 3.5 - 5.1 mmol/L 3.7 3.9 -  Chloride 98 - 111 mmol/L 106 106 -  CO2 22 - 32 mmol/L 26 25 -  Calcium 8.9 - 10.3 mg/dL 9.1 8.9 -  Total Protein 6.5 - 8.1 g/dL 6.7 6.7 6.2  Total Bilirubin 0.3 - 1.2 mg/dL 1.7(H) 1.6(H) 1.1  Alkaline Phos 38 - 126 U/L 58 60 63  AST 15 - 41 U/L 29 32 27  ALT 0 - 44 U/L 28 34 29       DIAGNOSTIC IMAGING:  I have reviewed his scans from 03/11/2018.     ASSESSMENT & PLAN:   Lymphoma, small lymphocytic (HCC) 1.  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  No evidence of transformation. 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  - Bone marrow aspirate and flow cytometry positive for lymphoma involvement, chromosome analysis normal, CLL FISH panel normal - Plavix was discontinued after contacting his cardiologist Dr. Ellyn Hack -Ibrutinib 420 mg daily started on 12/01/2017, developed skin rash and ibrutinib was held on 12/13/2017 through 12/16/2017.  Rash improved with steroids and Ibrutinib was restarted back at the same dose on 12/16/2017.  He did not get any further rash.  No other side effects were noted. -Rituximab every 4 weeks started with cycle 2.  This will be continued through cycle 7 for 6 doses.  - CT CAP on 03/11/2018 showed response to therapy with decreasing size of nodes in the chest, abdomen and pelvis.  Spleen size also improved.  Decreasing size of the extranodal soft tissue mass of the right flank previously 2.5 cm, now 1.3 cm. - He had microscopic hematuria on  DOT physical.  Ibrutinib can cause some hematuria. - He tolerated cycle 5 right eczema very well month  ago. - He has mild itching on his abdomen and upper back but no clear rash.  I only see scratch marks. -We reviewed his blood work today which is within normal limits.  He may proceed with his final dose of rituximab today. - We will see him back in 6 weeks with repeat scans.      Orders placed this encounter:  Orders Placed This Encounter  Procedures  . CT Chest W Contrast  . CT Abdomen Pelvis W Contrast  . Magnesium  . CBC with Differential/Platelet  . Comprehensive metabolic panel  . Lactate dehydrogenase  . Uric acid      Derek Jack, MD [place] [phone]

## 2018-05-21 NOTE — Assessment & Plan Note (Signed)
1.  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  No evidence of transformation. 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  - Bone marrow aspirate and flow cytometry positive for lymphoma involvement, chromosome analysis normal, CLL FISH panel normal - Plavix was discontinued after contacting his cardiologist Dr. Ellyn Hack -Ibrutinib 420 mg daily started on 12/01/2017, developed skin rash and ibrutinib was held on 12/13/2017 through 12/16/2017.  Rash improved with steroids and Ibrutinib was restarted back at the same dose on 12/16/2017.  He did not get any further rash.  No other side effects were noted. -Rituximab every 4 weeks started with cycle 2.  This will be continued through cycle 7 for 6 doses.  - CT CAP on 03/11/2018 showed response to therapy with decreasing size of nodes in the chest, abdomen and pelvis.  Spleen size also improved.  Decreasing size of the extranodal soft tissue mass of the right flank previously 2.5 cm, now 1.3 cm. - He had microscopic hematuria on DOT physical.  Ibrutinib can cause some hematuria. - He tolerated cycle 5 right eczema very well month ago. - He has mild itching on his abdomen and upper back but no clear rash.  I only see scratch marks. -We reviewed his blood work today which is within normal limits.  He may proceed with his final dose of rituximab today. - We will see him back in 6 weeks with repeat scans.

## 2018-05-25 ENCOUNTER — Other Ambulatory Visit: Payer: Self-pay | Admitting: Cardiology

## 2018-05-25 MED ORDER — ATORVASTATIN CALCIUM 80 MG PO TABS: 40.0000 mg | ORAL_TABLET | Freq: Every day | ORAL | 11 refills | Status: DC

## 2018-05-25 MED ORDER — METOPROLOL TARTRATE 25 MG PO TABS: 12.5000 mg | ORAL_TABLET | Freq: Two times a day (BID) | ORAL | 11 refills | Status: DC

## 2018-05-25 NOTE — Telephone Encounter (Signed)
° ° ° ° ° °  1. Which medications need to be refilled? (please list name of each medication and dose if known) metoprolol tartrate  and atorvastatin (LIPITOR) 80 MG tablet  2. Which pharmacy/location (including street and city if local pharmacy) is medication to be sent to? [id number] - [place], [address]  3. Do they need a 30 day or 90 day supply? [place]

## 2018-06-17 ENCOUNTER — Telehealth (HOSPITAL_COMMUNITY): Payer: Self-pay | Admitting: Pharmacy Technician

## 2018-06-17 MED FILL — IMBRUVICA 420 MG TAB: 420 | 28 days supply | Qty: 28 | Fill #2

## 2018-06-17 NOTE — Telephone Encounter (Signed)
Oral Oncology Patient Advocate Encounter  Patient's insurance changed to [employer] on 06/14/18.  Prior authorization is required.   Called [employer] to initiate prior authorization.     PA # [id number] PA approved from 06/17/18-06/17/19.   Copay is $70.  Copay card reduces copay to $10/month.  [place] Patient Adam Reyes Phone [phone] Fax [phone] 06/17/2018 10:14 AM

## 2018-06-29 ENCOUNTER — Other Ambulatory Visit (HOSPITAL_COMMUNITY)
Admission: RE | Admit: 2018-06-29 | Discharge: 2018-06-29 | Disposition: A | Discharge status: Home / Self Care | Payer: Managed Care, Other (non HMO) | Source: Ambulatory Visit | Attending: Internal Medicine | Admitting: Internal Medicine

## 2018-06-29 ENCOUNTER — Inpatient Hospital Stay (HOSPITAL_COMMUNITY): Payer: Managed Care, Other (non HMO) | Attending: Hematology

## 2018-06-29 ENCOUNTER — Ambulatory Visit (HOSPITAL_COMMUNITY)
Admission: RE | Admit: 2018-06-29 | Discharge: 2018-06-29 | Disposition: A | Discharge status: Home / Self Care | Payer: Managed Care, Other (non HMO) | Source: Ambulatory Visit | Attending: Nurse Practitioner | Admitting: Nurse Practitioner

## 2018-06-29 DIAGNOSIS — C9110 Chronic lymphocytic leukemia of B-cell type not having achieved remission: Secondary | ICD-10-CM | POA: Insufficient documentation

## 2018-06-29 DIAGNOSIS — Z9221 Personal history of antineoplastic chemotherapy: Secondary | ICD-10-CM | POA: Diagnosis not present

## 2018-06-29 DIAGNOSIS — Z7982 Long term (current) use of aspirin: Secondary | ICD-10-CM | POA: Insufficient documentation

## 2018-06-29 DIAGNOSIS — Z87891 Personal history of nicotine dependence: Secondary | ICD-10-CM | POA: Diagnosis not present

## 2018-06-29 DIAGNOSIS — Z79899 Other long term (current) drug therapy: Secondary | ICD-10-CM | POA: Insufficient documentation

## 2018-06-29 LAB — CBC WITH DIFFERENTIAL/PLATELET
ABS IMMATURE GRANULOCYTES: 0.05 10*3/uL (ref 0.00–0.07)
Basophils Absolute: 0 10*3/uL (ref 0.0–0.1)
Basophils Relative: 1 %
Eosinophils Absolute: 0.1 10*3/uL (ref 0.0–0.5)
Eosinophils Relative: 2 %
HCT: 49 % (ref 39.0–52.0)
HEMOGLOBIN: 16.1 g/dL (ref 13.0–17.0)
IMMATURE GRANULOCYTES: 1 %
LYMPHS PCT: 12 %
Lymphs Abs: 0.8 10*3/uL (ref 0.7–4.0)
MCH: 30.3 pg (ref 26.0–34.0)
MCHC: 32.9 g/dL (ref 30.0–36.0)
MCV: 92.3 fL (ref 80.0–100.0)
MONOS PCT: 10 %
Monocytes Absolute: 0.7 10*3/uL (ref 0.1–1.0)
NEUTROS ABS: 5 10*3/uL (ref 1.7–7.7)
NEUTROS PCT: 74 %
Platelets: 135 10*3/uL — ABNORMAL LOW (ref 150–400)
RBC: 5.31 MIL/uL (ref 4.22–5.81)
RDW: 12.7 % (ref 11.5–15.5)
WBC: 6.7 10*3/uL (ref 4.0–10.5)
nRBC: 0 % (ref 0.0–0.2)

## 2018-06-29 LAB — COMPREHENSIVE METABOLIC PANEL
ALK PHOS: 52 U/L (ref 38–126)
ALT: 24 U/L (ref 0–44)
AST: 25 U/L (ref 15–41)
Albumin: 4.2 g/dL (ref 3.5–5.0)
Anion gap: 7 (ref 5–15)
BUN: 21 mg/dL — AB (ref 6–20)
CALCIUM: 9 mg/dL (ref 8.9–10.3)
CHLORIDE: 110 mmol/L (ref 98–111)
CO2: 25 mmol/L (ref 22–32)
CREATININE: 0.94 mg/dL (ref 0.61–1.24)
GFR calc non Af Amer: >60 mL/min (ref >60)
Glucose, Bld: 113 mg/dL — ABNORMAL HIGH (ref 70–99)
Potassium: 3.9 mmol/L (ref 3.5–5.1)
SODIUM: 142 mmol/L (ref 135–145)
Total Bilirubin: 1.6 mg/dL — ABNORMAL HIGH (ref 0.3–1.2)
Total Protein: 6.6 g/dL (ref 6.5–8.1)

## 2018-06-29 LAB — LIPID PANEL
Cholesterol: 94 mg/dL (ref 0–200)
HDL: 34 mg/dL — ABNORMAL LOW (ref >40)
LDL Cholesterol: 49 mg/dL (ref 0–99)
Total CHOL/HDL Ratio: 2.8 RATIO
Triglycerides: 54 mg/dL (ref <150)
VLDL: 11 mg/dL (ref 0–40)

## 2018-06-29 LAB — URINALYSIS, ROUTINE W REFLEX MICROSCOPIC
Bacteria, UA: NONE SEEN
Bilirubin Urine: NEGATIVE
Glucose, UA: NEGATIVE mg/dL
Ketones, ur: NEGATIVE mg/dL
Leukocytes, UA: NEGATIVE
Nitrite: NEGATIVE
Protein, ur: NEGATIVE mg/dL
Specific Gravity, Urine: 1.018 (ref 1.005–1.030)
pH: 5 (ref 5.0–8.0)

## 2018-06-29 LAB — CBC
HCT: 48.4 % (ref 39.0–52.0)
Hemoglobin: 16 g/dL (ref 13.0–17.0)
MCH: 30.4 pg (ref 26.0–34.0)
MCHC: 33.1 g/dL (ref 30.0–36.0)
MCV: 92 fL (ref 80.0–100.0)
Platelets: 137 10*3/uL — ABNORMAL LOW (ref 150–400)
RBC: 5.26 MIL/uL (ref 4.22–5.81)
RDW: 12.7 % (ref 11.5–15.5)
WBC: 6.7 10*3/uL (ref 4.0–10.5)
nRBC: 0 % (ref 0.0–0.2)

## 2018-06-29 LAB — LACTATE DEHYDROGENASE: LDH: 160 U/L (ref 98–192)

## 2018-06-29 LAB — MAGNESIUM: Magnesium: 2.2 mg/dL (ref 1.7–2.4)

## 2018-06-29 LAB — URIC ACID: URIC ACID, SERUM: 8.2 mg/dL (ref 3.7–8.6)

## 2018-06-29 LAB — PSA: Prostatic Specific Antigen: 2.15 ng/mL (ref 0.00–4.00)

## 2018-06-29 MED ORDER — IOPAMIDOL (ISOVUE-300) INJECTION 61%
100.0000 mL | Freq: Once | INTRAVENOUS | Status: AC | PRN
  Administered 2018-06-29: 100 mL via INTRAVENOUS

## 2018-07-02 ENCOUNTER — Encounter (HOSPITAL_COMMUNITY): Payer: Self-pay | Admitting: Hematology

## 2018-07-02 ENCOUNTER — Inpatient Hospital Stay (HOSPITAL_BASED_OUTPATIENT_CLINIC_OR_DEPARTMENT_OTHER): Payer: Managed Care, Other (non HMO) | Admitting: Hematology

## 2018-07-02 VITALS — BP 135/71 | HR 65 | Temp 98.0°F | Resp 16 | Wt 272.3 lb

## 2018-07-02 DIAGNOSIS — C8300 Small cell B-cell lymphoma, unspecified site: Secondary | ICD-10-CM

## 2018-07-02 DIAGNOSIS — C9110 Chronic lymphocytic leukemia of B-cell type not having achieved remission: Secondary | ICD-10-CM | POA: Diagnosis not present

## 2018-07-02 DIAGNOSIS — Z87891 Personal history of nicotine dependence: Secondary | ICD-10-CM

## 2018-07-02 DIAGNOSIS — Z7982 Long term (current) use of aspirin: Secondary | ICD-10-CM

## 2018-07-02 DIAGNOSIS — Z79899 Other long term (current) drug therapy: Secondary | ICD-10-CM

## 2018-07-02 DIAGNOSIS — Z9221 Personal history of antineoplastic chemotherapy: Secondary | ICD-10-CM

## 2018-07-02 NOTE — Progress Notes (Signed)
Adam Reyes, [place] [address]   CLINIC:  Medical Oncology/Hematology  PCP:  Asencion Noble, MD [address] [place] [address] [phone]   REASON FOR VISIT: Follow-up for chronic lymphocytic leukemia (CLL)  CURRENT THERAPY: Ibrutinib 420 mg daily   BRIEF ONCOLOGIC HISTORY:    Chronic lymphocytic leukemia (CLL), B-cell ([place])   06/14/2015 Imaging    CT neck- Bulky adenopathy throughout the neck bilaterally. There also are enlarged parotid lymph nodes bilaterally. There is bilateral axillary adenopathy as well as mediastinal adenopathy. Findings are consistent with lymphoma. Biopsy recommended.    06/27/2015 Procedure    Left neck lymph node biopsy by Dr. Benjamine Mola    06/27/2015 Pathology Results    Diagnosis Lymph node for lymphoma, Left neck node for lymphoma work up - [place].  LOW GRADE.    06/27/2015 Pathology Results    Tissue-Flow Cytometry - MONOCLONAL B CELL POPULATION IDENTIFIED. The phenotypic features are consistent with small lymphocytic lymphoma/chronic lymphocytic leukemia and correlate well with the morphology in the lymph node     Lymphoma, small lymphocytic ([place])   10/14/2017 Initial Diagnosis    Lymphoma, small lymphocytic ([place])    12/25/2017 -  Chemotherapy    The patient had riTUXimab (RITUXAN) 900 mg in sodium chloride 0.9 % 250 mL (2.6471 mg/mL) infusion, 375 mg/m2 = 900 mg, Intravenous,  Once, 6 of 6 cycles Dose modification: 500 mg/m2 (original dose 500 mg/m2, Cycle 2, Reason: Provider Judgment) Administration: 900 mg (01/01/2018), 1,300 mg (01/29/2018), 1,300 mg (02/26/2018), 1,300 mg (03/26/2018), 1,300 mg (04/23/2018)  for chemotherapy treatment.       INTERVAL HISTORY:  [name] 62 y.o. male returns for routine follow-up for chronic lymphocytic leukemia. He is still having small bums and pruritis on his abdomen and back area. There are small petechia from scratching on his trunk as well.  Denies any nausea, vomiting, or diarrhea. Denies any new pains. Had not noticed any recent bleeding such as epistaxis, hematuria or hematochezia. Denies recent chest pain on exertion, shortness of breath on minimal exertion, pre-syncopal episodes, or palpitations. Denies any numbness or tingling in hands or feet. Denies any recent fevers, infections, or recent hospitalizations. Denies any night sweat, chills, or unexplained weight loss. He reports his appetite at 100% and energy level is at 75%.     REVIEW OF SYSTEMS:  Review of Systems  Skin: Positive for itching and rash.  All other systems reviewed and are negative.    PAST MEDICAL/SURGICAL HISTORY:  Past Medical History:  Diagnosis Date  . 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 Enlarged lymph node    left neck  . STEMI (ST elevation myocardial infarction) ([facility]) 03/05/2017   hx/notes 03/05/2017 -likely aborted anterior STEMI with 50% bifurcation LAD-Diag1.  No PCI.  Preserved EF   Past Surgical History:  Procedure Laterality Date  . COLONOSCOPY N/A 10/11/2015   Procedure: COLONOSCOPY;  Surgeon: Rogene Houston, MD;  [facility];  Service: Endoscopy;  Laterality: N/A;  10/11/2015  . Graded Exercise Tolerance Test (GXT/ETT)  05/2017   10.7 METs (9: 25 min.  Reached 103% max predicted heart rate).  No EKG findings to suggest coronary ischemia.  Negative, low risk GXT  . LEFT HEART CATH AND CORONARY ANGIOGRAPHY N/A 03/05/2017   Procedure: LEFT HEART  CATH AND CORONARY ANGIOGRAPHY;  Surgeon: Leonie Man, MD;  Location: [facility];  Service: Cardiovascular: pLAD-Diag1 50-55% (non-flow-limiiting).  EF ~50-55%.  although bifurcation lesion was presumed Culprit - no PCI (not flow limiting). - Med Rx.  [name] MASS BIOPSY Left 06/27/2015   Procedure: OPEN LEFT NECK BIOPSY ;   Surgeon: Leta Baptist, MD;  Location: [facility];  Service: ENT;  Laterality: Left;  . REFRACTIVE SURGERY Bilateral      SOCIAL HISTORY:  Social History   Socioeconomic History  . Marital status: Married    Spouse name: Not on file  . Number of children: Not on file  . Years of education: Not on file  . Highest education level: Not on file  Occupational History  . Not on file  Social Needs  . Financial resource strain: Not on file  . Food insecurity:    Worry: Not on file    Inability: Not on file  . Transportation needs:    Medical: Not on file    Non-medical: Not on file  Tobacco Use  . Smoking status: Former Smoker    Years: 2.00    Types: Cigarettes  . Smokeless tobacco: Former Systems developer    Types: Chew, Snuff  . Tobacco comment: 03/06/2017 "quit smoking when I was young; quit chew/snuff in ~ 2008"  Substance and Sexual Activity  . Alcohol use: Yes    Comment: 03/06/2017 "maybe 6 pack beer/month"  . Drug use: No  . Sexual activity: Yes  Lifestyle  . Physical activity:    Days per week: Not on file    Minutes per session: Not on file  . Stress: Not on file  Relationships  . Social connections:    Talks on phone: Not on file    Gets together: Not on file    Attends religious service: Not on file    Active member of club or organization: Not on file    Attends meetings of clubs or organizations: Not on file    Relationship status: Not on file  . Intimate partner violence:    Fear of current or ex partner: Not on file    Emotionally abused: Not on file    Physically abused: Not on file    Forced sexual activity: Not on file  Other Topics Concern  . Not on file  Social History Narrative  . Not on file    FAMILY HISTORY:  Family History  Problem Relation Age of Onset  . Diabetes Mother   . Cancer Father   . Cancer Brother     CURRENT MEDICATIONS:  Outpatient Encounter Medications as of 07/02/2018  Medication Sig  . aspirin 81 MG chewable tablet  Chew 1 tablet (81 mg total) by mouth every other day.  [name] atorvastatin (LIPITOR) 80 MG tablet Take 0.5 tablets (40 mg total) by mouth daily at 6 PM.  . Ibrutinib 420 MG TABS Take 420 mg by mouth daily.  . metoprolol tartrate (LOPRESSOR) 25 MG tablet Take 0.5 tablets (12.5 mg total) by mouth 2 (two) times daily.  . naproxen sodium (ANAPROX) 550 MG tablet TK 1 T PO Q 12 H PRN  . [DISCONTINUED] vitamin C (ASCORBIC ACID) 250 MG tablet Take by mouth.  . nitroGLYCERIN (NITROSTAT) 0.4 MG SL tablet Place 1 tablet (0.4 mg total) under the tongue every 5 (five) minutes as needed for chest pain. (Patient not taking: Reported on 07/02/2018)   No facility-administered encounter medications on file as of 07/02/2018.  ALLERGIES:  No Known Allergies   PHYSICAL EXAM:  ECOG Performance status: 1  Vitals:   07/02/18 1059  BP: 135/71  Pulse: 65  Resp: 16  Temp: 98 F (36.7 C)  SpO2: 96%   Filed Weights   07/02/18 1059  Weight: 272 lb 4.8 oz (123.5 kg)    Physical Exam Constitutional:      Appearance: Normal appearance. He is normal weight.  Abdominal:     General: Abdomen is flat.     Palpations: Abdomen is soft.  Musculoskeletal: Normal range of motion.  Skin:    General: Skin is warm and dry.     Findings: Bruising and rash present.  Neurological:     Mental Status: He is alert and oriented to person, place, and time. Mental status is at baseline.  Psychiatric:        Mood and Affect: Mood normal.        Behavior: Behavior normal.        Thought Content: Thought content normal.        Judgment: Judgment normal.      LABORATORY DATA:  I have reviewed the labs as listed.  CBC    Component Value Date/Time   WBC 6.7 06/29/2018 0854   WBC 6.7 06/29/2018 0854   RBC 5.31 06/29/2018 0854   RBC 5.26 06/29/2018 0854   HGB 16.1 06/29/2018 0854   HGB 16.0 06/29/2018 0854   HCT 49.0 06/29/2018 0854   HCT 48.4 06/29/2018 0854   PLT 135 (L) 06/29/2018 0854   PLT 137 (L) 06/29/2018  0854   MCV 92.3 06/29/2018 0854   MCV 92.0 06/29/2018 0854   MCH 30.3 06/29/2018 0854   MCH 30.4 06/29/2018 0854   MCHC 32.9 06/29/2018 0854   MCHC 33.1 06/29/2018 0854   RDW 12.7 06/29/2018 0854   RDW 12.7 06/29/2018 0854   LYMPHSABS 0.8 06/29/2018 0854   MONOABS 0.7 06/29/2018 0854   EOSABS 0.1 06/29/2018 0854   BASOSABS 0.0 06/29/2018 0854   CMP Latest Ref Rng & Units 06/29/2018 05/21/2018 04/23/2018  Glucose 70 - 99 mg/dL 113(H) 124(H) 203(H)  BUN 6 - 20 mg/dL 21(H) 19 20  Creatinine 0.61 - 1.24 mg/dL 0.94 1.00 1.03  Sodium 135 - 145 mmol/L 142 139 139  Potassium 3.5 - 5.1 mmol/L 3.9 3.7 3.9  Chloride 98 - 111 mmol/L 110 106 106  CO2 22 - 32 mmol/L 25 26 25   Calcium 8.9 - 10.3 mg/dL 9.0 9.1 8.9  Total Protein 6.5 - 8.1 g/dL 6.6 6.7 6.7  Total Bilirubin 0.3 - 1.2 mg/dL 1.6(H) 1.7(H) 1.6(H)  Alkaline Phos 38 - 126 U/L 52 58 60  AST 15 - 41 U/L 25 29 32  ALT 0 - 44 U/L 24 28 34       DIAGNOSTIC IMAGING:  I have independently reviewed the scans and discussed with the patient.   I have reviewed Francene Finders, NP's note and agree with the documentation.  I personally performed a face-to-face visit, made revisions and my assessment and plan is as follows.    ASSESSMENT & PLAN:   Lymphoma, small lymphocytic (HCC) 1.  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  No evidence of transformation. 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  - Bone marrow aspirate and  flow cytometry positive for lymphoma involvement, chromosome analysis normal, CLL FISH panel normal - Plavix was discontinued after contacting his cardiologist Dr. Ellyn Hack -Ibrutinib 420 mg daily started on 12/01/2017, developed skin rash and ibrutinib was held on 12/13/2017 through 12/16/2017.  Rash improved with  steroids and Ibrutinib was restarted back at the same dose on 12/16/2017.  He did not get any further rash.  No other side effects were noted. -6 cycles of rituximab from 01/01/2018 through 05/21/2018.    - CT CAP on 03/11/2018 showed response to therapy with decreasing in size of the nodes in the chest, abdomen and pelvis.  Spleen size improved.  Decreasing size of the extranodal soft tissue mass of the right flank previously 2.5 cm now 1.3 cm.   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  Ibrutinib can cause some hematuria.  He does not have any gross hematuria at this time. -He has mild erythematous papular rash on the trunk.  Sometimes he feels itchy.  I have suggested him to use Benadryl as needed. - We reviewed the results of the CT CAP dated 06/29/2018 showed overall interval decrease in size of the lymph nodes involving the chest, abdomen and pelvis.  Spleen size is normal.  Lymph nodes measure between 1 to 2 cm.  Stable small scattered pulmonary nodules, likely benign. - I will see him back in 2 months with repeat blood work. -I plan to repeat CT scans in 4 to 6 months.  2.  Mild thrombocytopenia: - His platelet count has improved from last visit.  This is from ibrutinib.  We will monitor it closely.      Orders placed this encounter:  Orders Placed This Encounter  Procedures  . Magnesium  . CBC with Differential/Platelet  . Comprehensive metabolic panel  . Lactate dehydrogenase      Derek Jack, MD [place] [phone]

## 2018-07-02 NOTE — Assessment & Plan Note (Addendum)
1.  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  No evidence of transformation. 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  - Bone marrow aspirate and flow cytometry positive for lymphoma involvement, chromosome analysis normal, CLL FISH panel normal - Plavix was discontinued after contacting his cardiologist Dr. Ellyn Hack -Ibrutinib 420 mg daily started on 12/01/2017, developed skin rash and ibrutinib was held on 12/13/2017 through 12/16/2017.  Rash improved with steroids and Ibrutinib was restarted back at the same dose on 12/16/2017.  He did not get any further rash.  No other side effects were noted. -6 cycles of rituximab from 01/01/2018 through 05/21/2018.    - CT CAP on 03/11/2018 showed response to therapy with decreasing in size of the nodes in the chest, abdomen and pelvis.  Spleen size improved.  Decreasing size of the extranodal soft tissue mass of the right flank previously 2.5 cm now 1.3 cm.   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  Ibrutinib can cause some hematuria.  He does not have any gross hematuria at this time. -He has mild erythematous papular rash on the trunk.  Sometimes he feels itchy.  I have suggested him to use Benadryl as needed. - We reviewed the results of the CT CAP dated 06/29/2018 showed overall interval decrease in size of the lymph nodes involving the chest, abdomen and pelvis.  Spleen size is normal.  Lymph nodes measure between 1 to 2 cm.  Stable small scattered pulmonary nodules, likely benign. - I will see him back in 2 months with repeat blood work. -I plan to repeat CT scans in 4 to 6 months.  2.  Mild thrombocytopenia: - His platelet count has improved from last visit.  This is from ibrutinib.  We will monitor it closely.

## 2018-07-02 NOTE — Patient Instructions (Signed)
[facility] at [facility] Discharge Instructions  Follow up in 2 months with labs   Thank you for choosing [facility] at [facility] to provide your oncology and hematology care.  To afford each patient quality time with our provider, please arrive at least 15 minutes before your scheduled appointment time.   If you have a lab appointment with the Cancer Center please come in thru the  Main Entrance and check in at the main information desk  You need to re-schedule your appointment should you arrive 10 or more minutes late.  We strive to give you quality time with our providers, and arriving late affects you and other patients whose appointments are after yours.  Also, if you no show three or more times for appointments you may be dismissed from the clinic at the providers discretion.     Again, thank you for choosing [facility].  Our hope is that these requests will decrease the amount of time that you wait before being seen by our physicians.       _____________________________________________________________  Should you have questions after your visit to [facility], please contact our office at [phone] between the hours of 8:00 a.m. and 4:30 p.m.  Voicemails left after 4:00 p.m. will not be returned until the following business day.  For prescription refill requests, have your pharmacy contact our office and allow 72 hours.    Cancer Center Support Programs:   > Cancer Support Group  2nd Tuesday of the month 1pm-2pm, Journey Room    

## 2018-07-13 MED FILL — IMBRUVICA 420 MG TAB: 420 | 28 days supply | Qty: 28 | Fill #3

## 2018-08-04 ENCOUNTER — Other Ambulatory Visit (HOSPITAL_COMMUNITY): Payer: Self-pay | Admitting: Hematology

## 2018-08-04 DIAGNOSIS — C8300 Small cell B-cell lymphoma, unspecified site: Secondary | ICD-10-CM

## 2018-08-11 ENCOUNTER — Other Ambulatory Visit (HOSPITAL_COMMUNITY): Payer: Self-pay | Admitting: *Deleted

## 2018-08-11 DIAGNOSIS — C8300 Small cell B-cell lymphoma, unspecified site: Secondary | ICD-10-CM

## 2018-08-11 MED ORDER — IBRUTINIB 420 MG PO TABS: 420.0000 mg | ORAL_TABLET | Freq: Every day | ORAL | 3 refills | Status: DC

## 2018-08-11 MED FILL — IMBRUVICA 420 MG TAB: 420 | 28 days supply | Qty: 28 | Fill #0

## 2018-08-11 NOTE — Telephone Encounter (Signed)
Chart reviewed, Imbruivica refilled.

## 2018-09-07 MED FILL — IMBRUVICA 420 MG TAB: 420 | 28 days supply | Qty: 28 | Fill #1

## 2018-09-09 ENCOUNTER — Inpatient Hospital Stay (HOSPITAL_COMMUNITY): Payer: Managed Care, Other (non HMO) | Attending: Hematology

## 2018-09-09 DIAGNOSIS — C9110 Chronic lymphocytic leukemia of B-cell type not having achieved remission: Secondary | ICD-10-CM | POA: Insufficient documentation

## 2018-09-09 LAB — COMPREHENSIVE METABOLIC PANEL
ALK PHOS: 58 U/L (ref 38–126)
ALT: 23 U/L (ref 0–44)
AST: 23 U/L (ref 15–41)
Albumin: 4.2 g/dL (ref 3.5–5.0)
Anion gap: 6 (ref 5–15)
BUN: 18 mg/dL (ref 6–20)
CALCIUM: 8.7 mg/dL — AB (ref 8.9–10.3)
CO2: 25 mmol/L (ref 22–32)
Chloride: 105 mmol/L (ref 98–111)
Creatinine, Ser: 0.92 mg/dL (ref 0.61–1.24)
GFR calc non Af Amer: >60 mL/min (ref >60)
Glucose, Bld: 95 mg/dL (ref 70–99)
Potassium: 3.5 mmol/L (ref 3.5–5.1)
Sodium: 136 mmol/L (ref 135–145)
Total Bilirubin: 1.3 mg/dL — ABNORMAL HIGH (ref 0.3–1.2)
Total Protein: 6.5 g/dL (ref 6.5–8.1)

## 2018-09-09 LAB — CBC WITH DIFFERENTIAL/PLATELET
Abs Immature Granulocytes: 0.07 10*3/uL (ref 0.00–0.07)
Basophils Absolute: 0.1 10*3/uL (ref 0.0–0.1)
Basophils Relative: 1 %
Eosinophils Absolute: 0.1 10*3/uL (ref 0.0–0.5)
Eosinophils Relative: 1 %
HCT: 47 % (ref 39.0–52.0)
Hemoglobin: 15.6 g/dL (ref 13.0–17.0)
Immature Granulocytes: 1 %
Lymphocytes Relative: 13 %
Lymphs Abs: 0.8 10*3/uL (ref 0.7–4.0)
MCH: 30.4 pg (ref 26.0–34.0)
MCHC: 33.2 g/dL (ref 30.0–36.0)
MCV: 91.4 fL (ref 80.0–100.0)
MONO ABS: 0.8 10*3/uL (ref 0.1–1.0)
Monocytes Relative: 12 %
Neutro Abs: 4.8 10*3/uL (ref 1.7–7.7)
Neutrophils Relative %: 72 %
Platelets: 134 10*3/uL — ABNORMAL LOW (ref 150–400)
RBC: 5.14 MIL/uL (ref 4.22–5.81)
RDW: 12.4 % (ref 11.5–15.5)
WBC: 6.6 10*3/uL (ref 4.0–10.5)
nRBC: 0 % (ref 0.0–0.2)

## 2018-09-09 LAB — MAGNESIUM: Magnesium: 2.3 mg/dL (ref 1.7–2.4)

## 2018-09-09 LAB — LACTATE DEHYDROGENASE: LDH: 165 U/L (ref 98–192)

## 2018-09-16 ENCOUNTER — Encounter (HOSPITAL_COMMUNITY): Payer: Self-pay | Admitting: Hematology

## 2018-09-16 ENCOUNTER — Inpatient Hospital Stay (HOSPITAL_COMMUNITY): Payer: Managed Care, Other (non HMO) | Attending: Hematology | Admitting: Hematology

## 2018-09-16 ENCOUNTER — Other Ambulatory Visit: Payer: Self-pay

## 2018-09-16 VITALS — BP 155/74 | HR 68 | Temp 97.5°F | Resp 18 | Wt 278.2 lb

## 2018-09-16 DIAGNOSIS — D696 Thrombocytopenia, unspecified: Secondary | ICD-10-CM | POA: Insufficient documentation

## 2018-09-16 DIAGNOSIS — C8300 Small cell B-cell lymphoma, unspecified site: Secondary | ICD-10-CM

## 2018-09-16 DIAGNOSIS — C9110 Chronic lymphocytic leukemia of B-cell type not having achieved remission: Secondary | ICD-10-CM | POA: Diagnosis not present

## 2018-09-16 NOTE — Patient Instructions (Signed)
[facility] at [facility] Discharge Instructions  Follow up in 3 months with labs    Thank you for choosing [facility] at [facility] to provide your oncology and hematology care.  To afford each patient quality time with our provider, please arrive at least 15 minutes before your scheduled appointment time.   If you have a lab appointment with the Cancer Center please come in thru the  Main Entrance and check in at the main information desk  You need to re-schedule your appointment should you arrive 10 or more minutes late.  We strive to give you quality time with our providers, and arriving late affects you and other patients whose appointments are after yours.  Also, if you no show three or more times for appointments you may be dismissed from the clinic at the providers discretion.     Again, thank you for choosing [facility].  Our hope is that these requests will decrease the amount of time that you wait before being seen by our physicians.       _____________________________________________________________  Should you have questions after your visit to  Cancer Center, please contact our office at [phone] between the hours of 8:00 a.m. and 4:30 p.m.  Voicemails left after 4:00 p.m. will not be returned until the following business day.  For prescription refill requests, have your pharmacy contact our office and allow 72 hours.    Cancer Center Support Programs:   > Cancer Support Group  2nd Tuesday of the month 1pm-2pm, Journey Room    

## 2018-09-16 NOTE — Progress Notes (Signed)
Adam Reyes, Adam Reyes [id number]   CLINIC:  Medical Oncology/Hematology  PCP:  Asencion Noble, MD [address] [place] [address] [phone]   REASON FOR VISIT: Follow-up for chronic lymphocytic leukemia (CLL)  CURRENT THERAPY:Ibrutinib 420 mg daily   BRIEF ONCOLOGIC HISTORY:    Chronic lymphocytic leukemia (CLL), B-cell ([place])   06/14/2015 Imaging    CT neck- Bulky adenopathy throughout the neck bilaterally. There also are enlarged parotid lymph nodes bilaterally. There is bilateral axillary adenopathy as well as mediastinal adenopathy. Findings are consistent with lymphoma. Biopsy recommended.    06/27/2015 Procedure    Left neck lymph node biopsy by Dr. Benjamine Mola    06/27/2015 Pathology Results    Diagnosis Lymph node for lymphoma, Left neck node for lymphoma work up - [place].  LOW GRADE.    06/27/2015 Pathology Results    Tissue-Flow Cytometry - MONOCLONAL B CELL POPULATION IDENTIFIED. The phenotypic features are consistent with small lymphocytic lymphoma/chronic lymphocytic leukemia and correlate well with the morphology in the lymph node     Lymphoma, small lymphocytic ([place])   10/14/2017 Initial Diagnosis    Lymphoma, small lymphocytic ([place])    12/25/2017 -  Chemotherapy    The patient had riTUXimab (RITUXAN) 900 mg in sodium chloride 0.9 % 250 mL (2.6471 mg/mL) infusion, 375 mg/m2 = 900 mg, Intravenous,  Once, 6 of 6 cycles Dose modification: 500 mg/m2 (original dose 500 mg/m2, Cycle 2, Reason: Provider Judgment) Administration: 900 mg (01/01/2018), 1,300 mg (01/29/2018), 1,300 mg (02/26/2018), 1,300 mg (03/26/2018), 1,300 mg (04/23/2018)  for chemotherapy treatment.       INTERVAL HISTORY:  Adam Reyes 63 y.o. male returns for routine follow-up for chronic lymphocytic leukemia. He is here today with his wife. He is taking his Ibrutinib as prescribed with no new side effects.  He reports he is still having the  same itchy skin problems that he has been having. He reports mild easy bruising. Denies any nausea, vomiting, or diarrhea. Denies any new pains. Had not noticed any recent bleeding such as epistaxis, hematuria or hematochezia. Denies recent chest pain on exertion, shortness of breath on minimal exertion, pre-syncopal episodes, or palpitations. Denies any numbness or tingling in hands or feet. Denies any recent fevers, infections, or recent hospitalizations. Patient reports appetite at 100% and energy level at 75%. He is eating well and maintaining his appetite.    REVIEW OF SYSTEMS:  Review of Systems  Skin: Positive for itching.  Hematological: Bruises/bleeds easily.  All other systems reviewed and are negative.    PAST MEDICAL/SURGICAL HISTORY:  Past Medical History:  Diagnosis Date  . 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 Enlarged lymph node    left neck  . STEMI (ST elevation myocardial infarction) ([place]) 03/05/2017   hx/notes 03/05/2017 -likely aborted anterior STEMI with 50% bifurcation LAD-Diag1.  No PCI.  Preserved EF   Past Surgical History:  Procedure Laterality Date  . COLONOSCOPY N/A 10/11/2015   Procedure: COLONOSCOPY;  Surgeon: Rogene Houston, MD;  Location: [facility];  Service: Endoscopy;  Laterality: N/A;  10/11/2015  . Graded Exercise Tolerance Test (GXT/ETT)  05/2017   10.7 METs (9: 25 min.  Reached 103% max predicted heart rate).  No EKG findings to suggest coronary ischemia.  Negative, low risk GXT  .  LEFT HEART CATH AND CORONARY ANGIOGRAPHY N/A 03/05/2017   Procedure: LEFT HEART CATH AND CORONARY ANGIOGRAPHY;  Surgeon: Leonie Man, MD;  Location: [facility];  Service: Cardiovascular: pLAD-Diag1 50-55% (non-flow-limiiting).  EF ~50-55%.  although bifurcation lesion was presumed Culprit - no PCI (not  flow limiting). - Med Rx.  [name] MASS BIOPSY Left 06/27/2015   Procedure: OPEN LEFT NECK BIOPSY ;  Surgeon: Leta Baptist, MD;  Location: [facility];  Service: ENT;  Laterality: Left;  . REFRACTIVE SURGERY Bilateral      SOCIAL HISTORY:  Social History   Socioeconomic History  . Marital status: Married    Spouse name: Not on file  . Number of children: Not on file  . Years of education: Not on file  . Highest education level: Not on file  Occupational History  . Not on file  Social Needs  . Financial resource strain: Not on file  . Food insecurity:    Worry: Not on file    Inability: Not on file  . Transportation needs:    Medical: Not on file    Non-medical: Not on file  Tobacco Use  . Smoking status: Former Smoker    Years: 2.00    Types: Cigarettes  . Smokeless tobacco: Former Systems developer    Types: Chew, Snuff  . Tobacco comment: 03/06/2017 "quit smoking when I was young; quit chew/snuff in ~ 2008"  Substance and Sexual Activity  . Alcohol use: Yes    Comment: 03/06/2017 "maybe 6 pack beer/month"  . Drug use: No  . Sexual activity: Yes  Lifestyle  . Physical activity:    Days per week: Not on file    Minutes per session: Not on file  . Stress: Not on file  Relationships  . Social connections:    Talks on phone: Not on file    Gets together: Not on file    Attends religious service: Not on file    Active member of club or organization: Not on file    Attends meetings of clubs or organizations: Not on file    Relationship status: Not on file  . Intimate partner violence:    Fear of current or ex partner: Not on file    Emotionally abused: Not on file    Physically abused: Not on file    Forced sexual activity: Not on file  Other Topics Concern  . Not on file  Social History Narrative  . Not on file    FAMILY HISTORY:  Family History  Problem Relation Age of Onset  . Diabetes Mother   . Cancer Father   . Cancer Brother     CURRENT MEDICATIONS:    Outpatient Encounter Medications as of 09/16/2018  Medication Sig  . aspirin 81 MG chewable tablet Chew 1 tablet (81 mg total) by mouth every other day.  [name] atorvastatin (LIPITOR) 80 MG tablet Take 0.5 tablets (40 mg total) by mouth daily at 6 PM.  . diphenhydrAMINE (BENADRYL) 25 mg capsule Take 25 mg by mouth at bedtime.  . Ibrutinib 420 MG TABS Take 420 mg by mouth daily.  . metoprolol tartrate (LOPRESSOR) 25 MG tablet Take 0.5 tablets (12.5 mg total) by mouth 2 (two) times daily.  . naproxen sodium (ANAPROX) 550 MG tablet TK 1 T PO Q 12 H PRN  . nitroGLYCERIN (NITROSTAT) 0.4 MG SL tablet Place 1 tablet (0.4 mg total) under the tongue every 5 (five) minutes as needed for chest pain.  No facility-administered encounter medications on file as of 09/16/2018.     ALLERGIES:  No Known Allergies   PHYSICAL EXAM:  ECOG Performance status: 1  Vitals:   09/16/18 1420  BP: (!) 155/74  Pulse: 68  Resp: 18  Temp: (!) 97.5 F (36.4 C)  SpO2: 99%   Filed Weights   09/16/18 1420  Weight: 278 lb 3.2 oz (126.2 kg)    Physical Exam Constitutional:      Appearance: Normal appearance. He is normal weight.  Cardiovascular:     Rate and Rhythm: Normal rate and regular rhythm.     Heart sounds: Normal heart sounds.  Pulmonary:     Effort: Pulmonary effort is normal.     Breath sounds: Normal breath sounds.  Musculoskeletal: Normal range of motion.  Skin:    General: Skin is warm and dry.  Neurological:     Mental Status: He is alert and oriented to person, place, and time. Mental status is at baseline.  Psychiatric:        Mood and Affect: Mood normal.        Behavior: Behavior normal.        Thought Content: Thought content normal.        Judgment: Judgment normal.    No palpable adenopathy or splenomegaly.  LABORATORY DATA:  I have reviewed the labs as listed.  CBC    Component Value Date/Time   WBC 6.6 09/09/2018 1515   RBC 5.14 09/09/2018 1515   HGB 15.6 09/09/2018 1515    HCT 47.0 09/09/2018 1515   PLT 134 (L) 09/09/2018 1515   MCV 91.4 09/09/2018 1515   MCH 30.4 09/09/2018 1515   MCHC 33.2 09/09/2018 1515   RDW 12.4 09/09/2018 1515   LYMPHSABS 0.8 09/09/2018 1515   MONOABS 0.8 09/09/2018 1515   EOSABS 0.1 09/09/2018 1515   BASOSABS 0.1 09/09/2018 1515   CMP Latest Ref Rng & Units 09/09/2018 06/29/2018 05/21/2018  Glucose 70 - 99 mg/dL 95 113(H) 124(H)  BUN 6 - 20 mg/dL 18 21(H) 19  Creatinine 0.61 - 1.24 mg/dL 0.92 0.94 1.00  Sodium 135 - 145 mmol/L 136 142 139  Potassium 3.5 - 5.1 mmol/L 3.5 3.9 3.7  Chloride 98 - 111 mmol/L 105 110 106  CO2 22 - 32 mmol/L 25 25 26   Calcium 8.9 - 10.3 mg/dL 8.7(L) 9.0 9.1  Total Protein 6.5 - 8.1 g/dL 6.5 6.6 6.7  Total Bilirubin 0.3 - 1.2 mg/dL 1.3(H) 1.6(H) 1.7(H)  Alkaline Phos 38 - 126 U/L 58 52 58  AST 15 - 41 U/L 23 25 29   ALT 0 - 44 U/L 23 24 28        DIAGNOSTIC IMAGING:  I have independently reviewed the scans and discussed with the patient.   I have reviewed Francene Finders, NP's note and agree with the documentation.  I personally performed a face-to-face visit, made revisions and my assessment and plan is as follows.    ASSESSMENT & PLAN:   Lymphoma, small lymphocytic (HCC) 1.  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  No evidence of transformation.  - Indication for treatment was thrombocytopenia.  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  He continues to work as a Administrator. - Bone marrow aspirate and flow cytometry positive for lymphoma involvement, chromosome analysis normal, CLL FISH panel normal - Plavix was discontinued after contacting his cardiologist Dr.  Ellyn Hack -Ibrutinib 420 mg daily started on 12/01/2017, developed skin rash and ibrutinib was held on 12/13/2017 through 12/16/2017.  Rash improved with steroids and Ibrutinib was restarted back at the same dose on 12/16/2017.   He did not get any further rash.  No other side effects were noted. -6 cycles of rituximab from 01/01/2018 through 05/21/2018.    - CT CAP on 03/11/2018 showed response to therapy with decreasing in size of the nodes in the chest, abdomen and pelvis.  Spleen size improved.  Decreasing size of the extranodal soft tissue mass of the right flank previously 2.5 cm now 1.3 cm.   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  Ibrutinib can cause some hematuria.  He does not have any gross hematuria at this time. -He continues to have occasional itching of his skin.  He uses Benadryl cream as needed.  - CT CAP on 06/29/2018 showed overall interval decrease in size of the lymph nodes involving the chest, abdomen and pelvis.  Spleen size is normal.  Lymph nodes measure between 1 to 2 cm.  Stable small scattered pulmonary nodules, likely benign.   - Physical exam today did not reveal any palpable adenopathy or splenomegaly. - Blood work today has been stable.  He is tolerating ibrutinib very well.  Denies any bleeding issues. - We will see him back in 3 months for follow-up.  2.  Mild thrombocytopenia: - Platelet count is staying between 130-140.  This is likely from ibrutinib.  He does not have any bleeding issues.       Orders placed this encounter:  Orders Placed This Encounter  Procedures  . Magnesium  . CBC with Differential/Platelet  . Comprehensive metabolic panel  . Lactate dehydrogenase      Derek Jack, MD [place] [phone]

## 2018-09-16 NOTE — Assessment & Plan Note (Signed)
1.  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  No evidence of transformation.  - Indication for treatment was thrombocytopenia.  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  He continues to work as a Administrator. - Bone marrow aspirate and flow cytometry positive for lymphoma involvement, chromosome analysis normal, CLL FISH panel normal - Plavix was discontinued after contacting his cardiologist Dr. Ellyn Hack -Ibrutinib 420 mg daily started on 12/01/2017, developed skin rash and ibrutinib was held on 12/13/2017 through 12/16/2017.  Rash improved with steroids and Ibrutinib was restarted back at the same dose on 12/16/2017.  He did not get any further rash.  No other side effects were noted. -6 cycles of rituximab from 01/01/2018 through 05/21/2018.    - CT CAP on 03/11/2018 showed response to therapy with decreasing in size of the nodes in the chest, abdomen and pelvis.  Spleen size improved.  Decreasing size of the extranodal soft tissue mass of the right flank previously 2.5 cm now 1.3 cm.   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  Ibrutinib can cause some hematuria.  He does not have any gross hematuria at this time. -He continues to have occasional itching of his skin.  He uses Benadryl cream as needed.  - CT CAP on 06/29/2018 showed overall interval decrease in size of the lymph nodes involving the chest, abdomen and pelvis.  Spleen size is normal.  Lymph nodes measure between 1 to 2 cm.  Stable small scattered pulmonary nodules, likely benign.   - Physical exam today did not reveal any palpable adenopathy or splenomegaly. - Blood work today has been stable.  He is tolerating ibrutinib very well.  Denies any bleeding issues. - We will see him back in 3 months for follow-up.  2.  Mild thrombocytopenia: - Platelet count is staying between 130-140.  This is likely from ibrutinib.   He does not have any bleeding issues.

## 2018-10-06 MED FILL — IMBRUVICA 420 MG TAB: 420 | 28 days supply | Qty: 28 | Fill #2

## 2018-10-27 ENCOUNTER — Telehealth: Payer: Self-pay | Admitting: *Deleted

## 2018-10-27 ENCOUNTER — Other Ambulatory Visit: Payer: Self-pay | Admitting: *Deleted

## 2018-10-27 DIAGNOSIS — I2510 Atherosclerotic heart disease of native coronary artery without angina pectoris: Secondary | ICD-10-CM

## 2018-10-27 DIAGNOSIS — E785 Hyperlipidemia, unspecified: Secondary | ICD-10-CM

## 2018-10-27 DIAGNOSIS — Z79899 Other long term (current) drug therapy: Secondary | ICD-10-CM

## 2018-10-27 NOTE — Telephone Encounter (Signed)
MAIL LETTER AND LABSLIP 

## 2018-10-27 NOTE — Telephone Encounter (Signed)
-----   Message from Raiford Simmonds, RN sent at 05/01/2018 10:28 AM EDT -----  LIPID DUE @4 /18/20  MAIL @ 09/30/18 -- LIPID

## 2018-11-04 MED FILL — IMBRUVICA 420 MG TAB: 420 | 28 days supply | Qty: 28 | Fill #3

## 2018-11-09 ENCOUNTER — Encounter (INDEPENDENT_AMBULATORY_CARE_PROVIDER_SITE_OTHER): Payer: Self-pay | Admitting: *Deleted

## 2018-11-19 LAB — LIPID PANEL
Chol/HDL Ratio: 2.8 ratio (ref 0.0–5.0)
Cholesterol, Total: 111 mg/dL (ref 100–199)
HDL: 39 mg/dL — ABNORMAL LOW (ref >39)
LDL Calculated: 54 mg/dL (ref 0–99)
Triglycerides: 90 mg/dL (ref 0–149)
VLDL Cholesterol Cal: 18 mg/dL (ref 5–40)

## 2018-11-25 ENCOUNTER — Other Ambulatory Visit (HOSPITAL_COMMUNITY): Payer: Self-pay | Admitting: Hematology

## 2018-11-25 DIAGNOSIS — C8300 Small cell B-cell lymphoma, unspecified site: Secondary | ICD-10-CM

## 2018-12-01 MED FILL — IMBRUVICA 420 MG TAB: 420 | 28 days supply | Qty: 28 | Fill #0

## 2018-12-15 ENCOUNTER — Other Ambulatory Visit: Payer: Self-pay

## 2018-12-15 ENCOUNTER — Inpatient Hospital Stay (HOSPITAL_COMMUNITY): Payer: Managed Care, Other (non HMO) | Attending: Hematology

## 2018-12-15 DIAGNOSIS — C9110 Chronic lymphocytic leukemia of B-cell type not having achieved remission: Secondary | ICD-10-CM | POA: Diagnosis not present

## 2018-12-15 LAB — CBC WITH DIFFERENTIAL/PLATELET
Abs Immature Granulocytes: 0.08 10*3/uL — ABNORMAL HIGH (ref 0.00–0.07)
Basophils Absolute: 0 10*3/uL (ref 0.0–0.1)
Basophils Relative: 1 %
Eosinophils Absolute: 0.1 10*3/uL (ref 0.0–0.5)
Eosinophils Relative: 1 %
HCT: 46.2 % (ref 39.0–52.0)
Hemoglobin: 15.8 g/dL (ref 13.0–17.0)
Immature Granulocytes: 2 %
Lymphocytes Relative: 12 %
Lymphs Abs: 0.7 10*3/uL (ref 0.7–4.0)
MCH: 31.9 pg (ref 26.0–34.0)
MCHC: 34.2 g/dL (ref 30.0–36.0)
MCV: 93.1 fL (ref 80.0–100.0)
Monocytes Absolute: 0.6 10*3/uL (ref 0.1–1.0)
Monocytes Relative: 11 %
Neutro Abs: 3.9 10*3/uL (ref 1.7–7.7)
Neutrophils Relative %: 73 %
Platelets: 145 10*3/uL — ABNORMAL LOW (ref 150–400)
RBC: 4.96 MIL/uL (ref 4.22–5.81)
RDW: 12.1 % (ref 11.5–15.5)
WBC: 5.3 10*3/uL (ref 4.0–10.5)
nRBC: 0 % (ref 0.0–0.2)

## 2018-12-15 LAB — COMPREHENSIVE METABOLIC PANEL
ALT: 30 U/L (ref 0–44)
AST: 27 U/L (ref 15–41)
Albumin: 4.3 g/dL (ref 3.5–5.0)
Alkaline Phosphatase: 52 U/L (ref 38–126)
Anion gap: 12 (ref 5–15)
BUN: 21 mg/dL (ref 8–23)
CO2: 23 mmol/L (ref 22–32)
Calcium: 9.1 mg/dL (ref 8.9–10.3)
Chloride: 106 mmol/L (ref 98–111)
Creatinine, Ser: 1.03 mg/dL (ref 0.61–1.24)
GFR calc Af Amer: >60 mL/min (ref >60)
GFR calc non Af Amer: >60 mL/min (ref >60)
Glucose, Bld: 82 mg/dL (ref 70–99)
Potassium: 3.8 mmol/L (ref 3.5–5.1)
Sodium: 141 mmol/L (ref 135–145)
Total Bilirubin: 1.4 mg/dL — ABNORMAL HIGH (ref 0.3–1.2)
Total Protein: 6.7 g/dL (ref 6.5–8.1)

## 2018-12-15 LAB — LACTATE DEHYDROGENASE: LDH: 163 U/L (ref 98–192)

## 2018-12-15 LAB — MAGNESIUM: Magnesium: 2.3 mg/dL (ref 1.7–2.4)

## 2018-12-22 ENCOUNTER — Encounter (HOSPITAL_COMMUNITY): Payer: Self-pay | Admitting: Hematology

## 2018-12-22 ENCOUNTER — Inpatient Hospital Stay (HOSPITAL_BASED_OUTPATIENT_CLINIC_OR_DEPARTMENT_OTHER): Payer: Managed Care, Other (non HMO) | Admitting: Hematology

## 2018-12-22 DIAGNOSIS — C9110 Chronic lymphocytic leukemia of B-cell type not having achieved remission: Secondary | ICD-10-CM | POA: Diagnosis not present

## 2018-12-22 DIAGNOSIS — D696 Thrombocytopenia, unspecified: Secondary | ICD-10-CM | POA: Diagnosis not present

## 2018-12-22 NOTE — Progress Notes (Signed)
Virtual Visit via Telephone Note  I connected with [name] on 12/22/18 at  3:35 PM EDT by telephone and verified that I am speaking with the correct person using two identifiers.   I discussed the limitations, risks, security and privacy concerns of performing an evaluation and management service by telephone and the availability of in person appointments. I also discussed with the patient that there may be a patient responsible charge related to this service. The patient expressed understanding and agreed to proceed.   History of Present Illness: #1 stage IV SLL, ibrutinib started on 12/01/2017. -#2 mild thrombocytopenia, improving.   Observations/Objective: -He is continuing to tolerate ibrutinib full dose of 420 mg daily very well.  He denies missing any doses.  Denies any major bleeding episodes or easy bruising.  He has mild erythematous macular rash on the right side of his abdomen.  He does have occasional itching episodes.  Denies any fevers, night sweats or weight loss.  Denies any ER visits or hospitalizations.  Denies any recent infections.  Denies any arthralgias or myalgias.   Assessment and Plan:  1.  Stage IV SLL: - Ibrutinib 420 mg daily started on 12/01/2017.  Has mild macular rash on the right side of the abdomen.  Has occasional episodes of itching without clear rash.  Denies any arthralgias or myalgias. -We reviewed his blood work.  White count is within normal limits.  Hemoglobin is also normal.  LFTs are mildly elevated with a total bilirubin of 1.4.  We will closely monitor it.  I will see him back in 3 months for follow-up.  2.  Mild thrombocytopenia: -Platelet count improved to 145, from 135 at prior visit.   Follow Up Instructions: RTC 3 months with labs.   I discussed the assessment and treatment plan with the patient. The patient was provided an opportunity to ask questions and all were answered. The patient agreed with the plan and demonstrated an  understanding of the instructions.   The patient was advised to call back or seek an in-person evaluation if the symptoms worsen or if the condition fails to improve as anticipated.  I provided 12 minutes of non-face-to-face time during this encounter.   Derek Jack, MD

## 2018-12-28 MED FILL — IMBRUVICA 420 MG TAB: 420 | 28 days supply | Qty: 28 | Fill #1

## 2019-01-28 MED FILL — IMBRUVICA 420 MG TAB: 420 | 28 days supply | Qty: 28 | Fill #2

## 2019-02-25 MED FILL — IMBRUVICA 420 MG TAB: 420 | 28 days supply | Qty: 28 | Fill #3

## 2019-03-09 ENCOUNTER — Encounter (HOSPITAL_COMMUNITY): Payer: Self-pay | Admitting: *Deleted

## 2019-03-09 NOTE — Progress Notes (Signed)
I received a voicemail from the patient reporting that he had whelps come up and he wanted [place] to be aware.  I tried to call patient back to triage the situation and had to leave a message. I asked that he return call to the clinic and he could speak with any of the nurses.

## 2019-03-09 NOTE — Progress Notes (Signed)
Patient returned call. He reports whelps on his arms and back. He said some have whiteheads on them.  He denies any itching.  This has been going on for a few days (2-3 at the most).  I talked with Francene Finders, NP.  This could be coming from the [place]. At this time, it is controlling his disease and the rash is minimal so no changes will be made. It will be assessed at his next appointment.  Patient was encouraged to use hydrocortisone cream for the itch should it occur.  Keep the area clean and dry and report any signs of infectious process (redness, streaking, swelling, etc).  Patient verbalizes understanding.

## 2019-03-24 ENCOUNTER — Other Ambulatory Visit (HOSPITAL_COMMUNITY): Payer: Self-pay | Admitting: Hematology

## 2019-03-24 DIAGNOSIS — C8300 Small cell B-cell lymphoma, unspecified site: Secondary | ICD-10-CM

## 2019-03-25 MED FILL — IMBRUVICA 420 MG TAB: 420 | 28 days supply | Qty: 28 | Fill #0

## 2019-03-26 ENCOUNTER — Inpatient Hospital Stay (HOSPITAL_COMMUNITY): Payer: Managed Care, Other (non HMO) | Attending: Hematology

## 2019-03-26 ENCOUNTER — Other Ambulatory Visit: Payer: Self-pay

## 2019-03-26 DIAGNOSIS — C9110 Chronic lymphocytic leukemia of B-cell type not having achieved remission: Secondary | ICD-10-CM | POA: Diagnosis present

## 2019-03-26 DIAGNOSIS — C8300 Small cell B-cell lymphoma, unspecified site: Secondary | ICD-10-CM | POA: Insufficient documentation

## 2019-03-26 DIAGNOSIS — D696 Thrombocytopenia, unspecified: Secondary | ICD-10-CM | POA: Insufficient documentation

## 2019-03-26 LAB — COMPREHENSIVE METABOLIC PANEL
ALT: 26 U/L (ref 0–44)
AST: 25 U/L (ref 15–41)
Albumin: 4.1 g/dL (ref 3.5–5.0)
Alkaline Phosphatase: 58 U/L (ref 38–126)
Anion gap: 8 (ref 5–15)
BUN: 18 mg/dL (ref 8–23)
CO2: 25 mmol/L (ref 22–32)
Calcium: 8.5 mg/dL — ABNORMAL LOW (ref 8.9–10.3)
Chloride: 105 mmol/L (ref 98–111)
Creatinine, Ser: 0.92 mg/dL (ref 0.61–1.24)
GFR calc Af Amer: >60 mL/min (ref >60)
GFR calc non Af Amer: >60 mL/min (ref >60)
Glucose, Bld: 138 mg/dL — ABNORMAL HIGH (ref 70–99)
Potassium: 3.5 mmol/L (ref 3.5–5.1)
Sodium: 138 mmol/L (ref 135–145)
Total Bilirubin: 1.1 mg/dL (ref 0.3–1.2)
Total Protein: 6.4 g/dL — ABNORMAL LOW (ref 6.5–8.1)

## 2019-03-26 LAB — CBC WITH DIFFERENTIAL/PLATELET
Abs Immature Granulocytes: 0.12 10*3/uL — ABNORMAL HIGH (ref 0.00–0.07)
Basophils Absolute: 0.1 10*3/uL (ref 0.0–0.1)
Basophils Relative: 1 %
Eosinophils Absolute: 0.1 10*3/uL (ref 0.0–0.5)
Eosinophils Relative: 1 %
HCT: 45.2 % (ref 39.0–52.0)
Hemoglobin: 15.6 g/dL (ref 13.0–17.0)
Immature Granulocytes: 2 %
Lymphocytes Relative: 9 %
Lymphs Abs: 0.6 10*3/uL — ABNORMAL LOW (ref 0.7–4.0)
MCH: 32.7 pg (ref 26.0–34.0)
MCHC: 34.5 g/dL (ref 30.0–36.0)
MCV: 94.8 fL (ref 80.0–100.0)
Monocytes Absolute: 0.6 10*3/uL (ref 0.1–1.0)
Monocytes Relative: 9 %
Neutro Abs: 5.3 10*3/uL (ref 1.7–7.7)
Neutrophils Relative %: 78 %
Platelets: 130 10*3/uL — ABNORMAL LOW (ref 150–400)
RBC: 4.77 MIL/uL (ref 4.22–5.81)
RDW: 12.2 % (ref 11.5–15.5)
WBC: 6.8 10*3/uL (ref 4.0–10.5)
nRBC: 0 % (ref 0.0–0.2)

## 2019-03-26 LAB — LACTATE DEHYDROGENASE: LDH: 179 U/L (ref 98–192)

## 2019-03-31 ENCOUNTER — Inpatient Hospital Stay (HOSPITAL_COMMUNITY): Payer: Managed Care, Other (non HMO) | Admitting: Hematology

## 2019-03-31 ENCOUNTER — Other Ambulatory Visit: Payer: Self-pay

## 2019-03-31 ENCOUNTER — Encounter (HOSPITAL_COMMUNITY): Payer: Self-pay | Admitting: Hematology

## 2019-03-31 VITALS — BP 144/95 | HR 71 | Temp 97.1°F | Resp 18 | Wt 275.2 lb

## 2019-03-31 DIAGNOSIS — C9110 Chronic lymphocytic leukemia of B-cell type not having achieved remission: Secondary | ICD-10-CM | POA: Diagnosis not present

## 2019-03-31 DIAGNOSIS — C8300 Small cell B-cell lymphoma, unspecified site: Secondary | ICD-10-CM | POA: Diagnosis not present

## 2019-03-31 NOTE — Assessment & Plan Note (Signed)
1.  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 -Ibrutinib 420 mg started on 12/01/2017, 6 cycles of rituximab from 01/01/2018 through 05/21/2018. - CT CAP on 06/29/2018 showed oral decrease in size of the lymph nodes in the chest, abdomen and pelvis.  Spleen is normal.  Lymph nodes measure between 1-2 cm.  Stable small scattered pulmonary nodules, likely benign. - He is continuing to tolerate ibrutinib very well.  He complains of nails becoming brittle.  Denies any myalgias or arthralgias. -Physical exam today did not reveal any palpable splenomegaly.  Subcentimeter lymph nodes palpable in the neck region. -I plan to see him back in 3 months for follow-up.  I plan to repeat CT CAP prior to next visit.  We reviewed his labs.  LDH and other blood work was within normal limits.  2.  Mild thrombocytopenia: -Platelets are staying between 130 and 140.  Likely myelosuppression from ibrutinib. -Does not have any bleeding issues.

## 2019-03-31 NOTE — Patient Instructions (Signed)
[facility] at [facility] Discharge Instructions  You were seen today by Dr. Katragadda. He went over your recent lab results. He will see you back in 3 months for labs and follow up.   Thank you for choosing [facility] at [facility] to provide your oncology and hematology care.  To afford each patient quality time with our provider, please arrive at least 15 minutes before your scheduled appointment time.   If you have a lab appointment with the Cancer Center please come in thru the  Main Entrance and check in at the main information desk  You need to re-schedule your appointment should you arrive 10 or more minutes late.  We strive to give you quality time with our providers, and arriving late affects you and other patients whose appointments are after yours.  Also, if you no show three or more times for appointments you may be dismissed from the clinic at the providers discretion.     Again, thank you for choosing [facility].  Our hope is that these requests will decrease the amount of time that you wait before being seen by our physicians.       _____________________________________________________________  Should you have questions after your visit to [facility], please contact our office at [phone] between the hours of 8:00 a.m. and 4:30 p.m.  Voicemails left after 4:00 p.m. will not be returned until the following business day.  For prescription refill requests, have your pharmacy contact our office and allow 72 hours.    Cancer Center Support Programs:   > Cancer Support Group  2nd Tuesday of the month 1pm-2pm, Journey Room    

## 2019-03-31 NOTE — Progress Notes (Signed)
Adam Reyes, [place] [id number]   CLINIC:  Medical Oncology/Hematology  PCP:  Asencion Noble, [address] [place] [address] [phone]   REASON FOR VISIT: Follow-up for chronic lymphocytic leukemia (CLL)  CURRENT THERAPY:Ibrutinib 420 mg daily   BRIEF ONCOLOGIC HISTORY:  Oncology History  Chronic lymphocytic leukemia (CLL), B-cell ([place])  06/14/2015 Imaging   CT neck- Bulky adenopathy throughout the neck bilaterally. There also are enlarged parotid lymph nodes bilaterally. There is bilateral axillary adenopathy as well as mediastinal adenopathy. Findings are consistent with lymphoma. Biopsy recommended.   06/27/2015 Procedure   Left neck lymph node biopsy by Dr. Benjamine Mola   06/27/2015 Pathology Results   Diagnosis Lymph node for lymphoma, Left neck node for lymphoma work up - [place].  LOW GRADE.   06/27/2015 Pathology Results   Tissue-Flow Cytometry - MONOCLONAL B CELL POPULATION IDENTIFIED. The phenotypic features are consistent with small lymphocytic lymphoma/chronic lymphocytic leukemia and correlate well with the morphology in the lymph node   Lymphoma, small lymphocytic ([place])  10/14/2017 Initial Diagnosis   Lymphoma, small lymphocytic ([place])   12/25/2017 -  Chemotherapy   The patient had riTUXimab (RITUXAN) 900 mg in sodium chloride 0.9 % 250 mL (2.6471 mg/mL) infusion, 375 mg/m2 = 900 mg, Intravenous,  Once, 6 of 6 cycles Dose modification: 500 mg/m2 (original dose 500 mg/m2, Cycle 2, Reason: Provider Judgment) Administration: 900 mg (01/01/2018), 1,300 mg (01/29/2018), 1,300 mg (02/26/2018), 1,300 mg (03/26/2018), 1,300 mg (04/23/2018)  for chemotherapy treatment.       INTERVAL HISTORY:  Adam Reyes 63 y.o. male seen for follow-up of CLL/SLL.  He is tolerating ibrutinib very well.  Appetite and energy levels are 100%.  He is continuing to do full-time job.  Denies any nosebleeds, hematuria, bleeding per  rectum or melena.  He does report blood filled blisters on the nose once or twice in the last 3 months.  He also complained of nasal becoming brittle.  Denies any fevers, night sweats or weight loss.  Denies any infections or hospitalizations.  REVIEW OF SYSTEMS:  Review of Systems  All other systems reviewed and are negative.    PAST MEDICAL/SURGICAL HISTORY:  Past Medical History:  Diagnosis Date  . 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 Enlarged lymph node    left neck  . STEMI (ST elevation myocardial infarction) ([place]) 03/05/2017   hx/notes 03/05/2017 -likely aborted anterior STEMI with 50% bifurcation LAD-Diag1.  No PCI.  Preserved EF   Past Surgical History:  Procedure Laterality Date  . COLONOSCOPY N/A 10/11/2015   Procedure: COLONOSCOPY;  Surgeon: Rogene Houston, MD;  [facility];  Service: Endoscopy;  Laterality: N/A;  10/11/2015  . Graded Exercise Tolerance Test (GXT/ETT)  05/2017   10.7 METs (9: 25 min.  Reached 103% max predicted heart rate).  No EKG findings to suggest coronary ischemia.  Negative, low risk GXT  . LEFT HEART CATH AND CORONARY ANGIOGRAPHY N/A 03/05/2017   Procedure: LEFT HEART CATH AND CORONARY ANGIOGRAPHY;  Surgeon: Leonie Man, MD;  Location: [facility];  Service: Cardiovascular: pLAD-Diag1 50-55% (non-flow-limiiting).  EF ~50-55%.  although bifurcation lesion was presumed Culprit - no PCI (not flow limiting). - Med Rx.  [name] MASS BIOPSY Left 06/27/2015   Procedure: OPEN LEFT NECK BIOPSY ;  Surgeon: Leta Baptist, MD;  Location: [facility];  Service: ENT;  Laterality: Left;  . REFRACTIVE SURGERY Bilateral      SOCIAL HISTORY:  Social History   Socioeconomic History  . Marital status: Married    Spouse name: Not on file  . Number of children: Not on file  . Years of  education: Not on file  . Highest education level: Not on file  Occupational History  . Not on file  Social Needs  . Financial resource strain: Not on file  . Food insecurity    Worry: Not on file    Inability: Not on file  . Transportation needs    Medical: Not on file    Non-medical: Not on file  Tobacco Use  . Smoking status: Former Smoker    Years: 2.00    Types: Cigarettes  . Smokeless tobacco: Former Systems developer    Types: Chew, Snuff  . Tobacco comment: 03/06/2017 "quit smoking when I was young; quit chew/snuff in ~ 2008"  Substance and Sexual Activity  . Alcohol use: Yes    Comment: 03/06/2017 "maybe 6 pack beer/month"  . Drug use: No  . Sexual activity: Yes  Lifestyle  . Physical activity    Days per week: Not on file    Minutes per session: Not on file  . Stress: Not on file  Relationships  . Social Herbalist on phone: Not on file    Gets together: Not on file    Attends religious service: Not on file    Active member of club or organization: Not on file    Attends meetings of clubs or organizations: Not on file    Relationship status: Not on file  . Intimate partner violence    Fear of current or ex partner: Not on file    Emotionally abused: Not on file    Physically abused: Not on file    Forced sexual activity: Not on file  Other Topics Concern  . Not on file  Social History Narrative  . Not on file    FAMILY HISTORY:  Family History  Problem Relation Age of Onset  . Diabetes Mother   . Cancer Father   . Cancer Brother     CURRENT MEDICATIONS:  Outpatient Encounter Medications as of 03/31/2019  Medication Sig  . aspirin 81 MG chewable tablet Chew 1 tablet (81 mg total) by mouth every other day.  [name] atorvastatin (LIPITOR) 80 MG tablet Take 0.5 tablets (40 mg total) by mouth daily at 6 PM.  . diphenhydrAMINE (BENADRYL) 25 mg capsule Take 25 mg by mouth at bedtime.  . IMBRUVICA 420 MG TABS TAKE 1 TABLET (420 MG) BY MOUTH DAILY.  . metoprolol  tartrate (LOPRESSOR) 25 MG tablet Take 0.5 tablets (12.5 mg total) by mouth 2 (two) times daily.  . naproxen sodium (ANAPROX) 550 MG tablet TK 1 T PO Q 12 H PRN  . nitroGLYCERIN (NITROSTAT) 0.4 MG SL tablet Place 1 tablet (0.4 mg total) under the tongue every 5 (five) minutes as needed for chest pain. (Patient not taking: Reported on 03/31/2019)   No facility-administered encounter medications on file as of 03/31/2019.     ALLERGIES:  No Known Allergies   PHYSICAL EXAM:  ECOG Performance status: 1  Vitals:   03/31/19 1505  BP: (!) 144/95  Pulse: 71  Resp: 18  Temp: (!) 97.1 F (36.2 C)  SpO2: 96%   Filed Weights   03/31/19 1505  Weight: 275 lb 3.2 oz (124.8 kg)    Physical Exam Constitutional:      Appearance: Normal appearance. He is normal weight.  Cardiovascular:     Rate and Rhythm: Normal rate and regular rhythm.     Heart sounds: Normal heart sounds.  Pulmonary:     Effort: Pulmonary effort is normal.     Breath sounds: Normal breath sounds.  Musculoskeletal: Normal range of motion.  Skin:    General: Skin is warm and dry.  Neurological:     Mental Status: He is alert and oriented to person, place, and time. Mental status is at baseline.  Psychiatric:        Mood and Affect: Mood normal.        Behavior: Behavior normal.        Thought Content: Thought content normal.        Judgment: Judgment normal.    No palpable adenopathy or splenomegaly.  LABORATORY DATA:  I have reviewed the labs as listed.  CBC    Component Value Date/Time   WBC 6.8 03/26/2019 1535   RBC 4.77 03/26/2019 1535   HGB 15.6 03/26/2019 1535   HCT 45.2 03/26/2019 1535   PLT 130 (L) 03/26/2019 1535   MCV 94.8 03/26/2019 1535   MCH 32.7 03/26/2019 1535   MCHC 34.5 03/26/2019 1535   RDW 12.2 03/26/2019 1535   LYMPHSABS 0.6 (L) 03/26/2019 1535   MONOABS 0.6 03/26/2019 1535   EOSABS 0.1 03/26/2019 1535   BASOSABS 0.1 03/26/2019 1535   CMP Latest Ref Rng & Units 03/26/2019 12/15/2018  09/09/2018  Glucose 70 - 99 mg/dL 138(H) 82 95  BUN 8 - 23 mg/dL 18 21 18   Creatinine 0.61 - 1.24 mg/dL 0.92 1.03 0.92  Sodium 135 - 145 mmol/L 138 141 136  Potassium 3.5 - 5.1 mmol/L 3.5 3.8 3.5  Chloride 98 - 111 mmol/L 105 106 105  CO2 22 - 32 mmol/L 25 23 25   Calcium 8.9 - 10.3 mg/dL 8.5(L) 9.1 8.7(L)  Total Protein 6.5 - 8.1 g/dL 6.4(L) 6.7 6.5  Total Bilirubin 0.3 - 1.2 mg/dL 1.1 1.4(H) 1.3(H)  Alkaline Phos 38 - 126 U/L 58 52 58  AST 15 - 41 U/L 25 27 23   ALT 0 - 44 U/L 26 30 23        DIAGNOSTIC IMAGING:  I have independently reviewed the scans and discussed with the patient.    ASSESSMENT & PLAN:   Lymphoma, small lymphocytic (HCC) 1.  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 -Ibrutinib 420 mg started on 12/01/2017, 6 cycles of rituximab from 01/01/2018 through 05/21/2018. - CT CAP on 06/29/2018 showed oral decrease in size of the lymph nodes in the chest, abdomen and pelvis.  Spleen is normal.  Lymph nodes measure between 1-2 cm.  Stable small scattered pulmonary nodules, likely benign. - He is continuing to tolerate ibrutinib very well.  He complains of nails becoming brittle.  Denies any myalgias or arthralgias. -Physical exam today did not reveal any palpable splenomegaly.  Subcentimeter lymph nodes palpable in the neck region. -I plan to see him back in 3 months for follow-up.  I plan to repeat CT CAP prior to next visit.  We reviewed his labs.  LDH and other blood work was within normal limits.  2.  Mild thrombocytopenia: -Platelets are staying between 130 and 140.  Likely myelosuppression from ibrutinib. -Does not have any bleeding issues.  Total time spent is 25 minutes with more than 50% of the time spent face-to-face discussing treatment plan, counseling and coordination of care.    Orders placed this encounter:  Orders Placed This Encounter  Procedures  . CT Abdomen Pelvis W  Contrast  . CT Chest W Contrast  . CBC with Differential/Platelet  . Comprehensive metabolic panel  . Lactate dehydrogenase      Derek Jack, MD [place] [phone]

## 2019-04-26 MED FILL — IMBRUVICA 420 MG TAB: 420 | 28 days supply | Qty: 28 | Fill #1

## 2019-05-02 ENCOUNTER — Other Ambulatory Visit: Payer: Self-pay | Admitting: Cardiology

## 2019-05-23 IMAGING — CT CT ABD-PELV W/ CM
5 of 10 series · 15 of 36 positions shown, 16 images · IV contrast (iopamidol)
Comparison: CT abdomen pelvis 07/21/2015.

CLINICAL DATA: Patient with history of lymphoma diagnosed 2 years
prior, untreated. Increasing groin lymph nodes.

EXAM:
CT CHEST, ABDOMEN, AND PELVIS WITH CONTRAST
TECHNIQUE: Multidetector CT imaging of the chest, abdomen and pelvis was
performed following the standard protocol during bolus
administration of intravenous contrast.
CONTRAST:  100mL [id number] IOPAMIDOL ([id number]) INJECTION 61%

[Series 3: cap with · axial · 0.82mm/px · z∈[-874,-424]mm · 4 of 150 slices shown, 5 images]
[im 30/150  mediastinal]
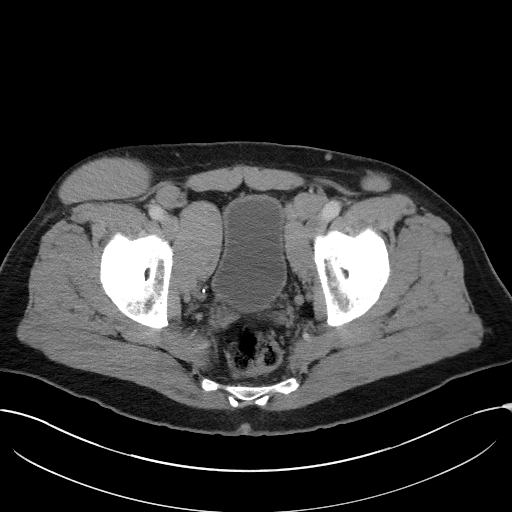
[im 30/150  lung]
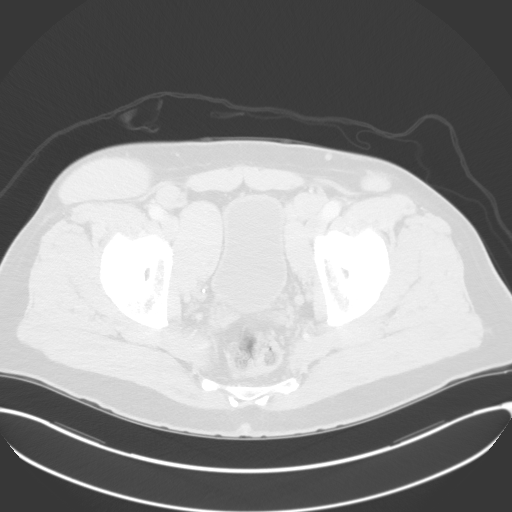
[im 60/150  lung]
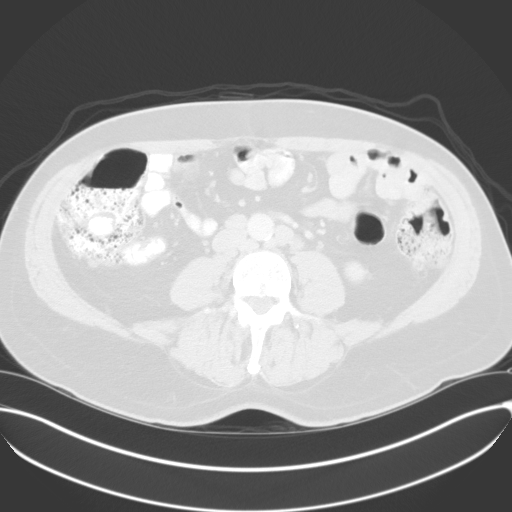
[im 90/150  lung]
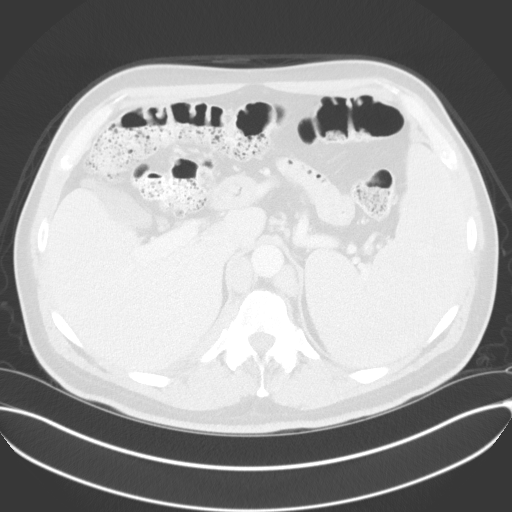
[im 120/150  lung]
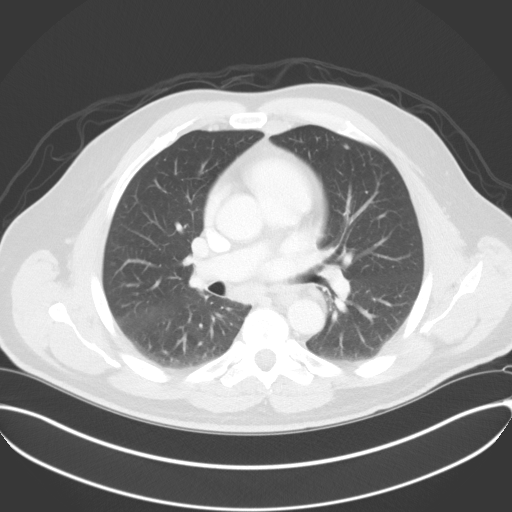

[Series 5: lung · axial · 0.82mm/px · z∈[-510,-334]mm · 4 of 148 slices shown]
[im 30/148  lung]
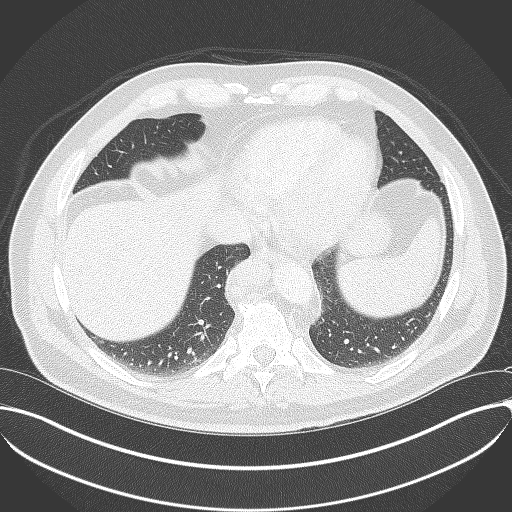
[im 59/148  lung]
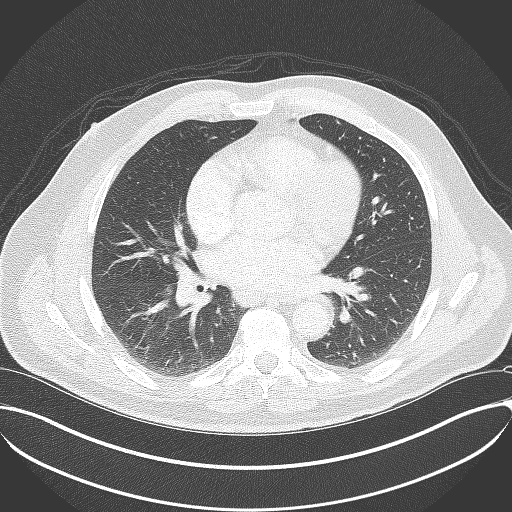
[im 89/148  lung]
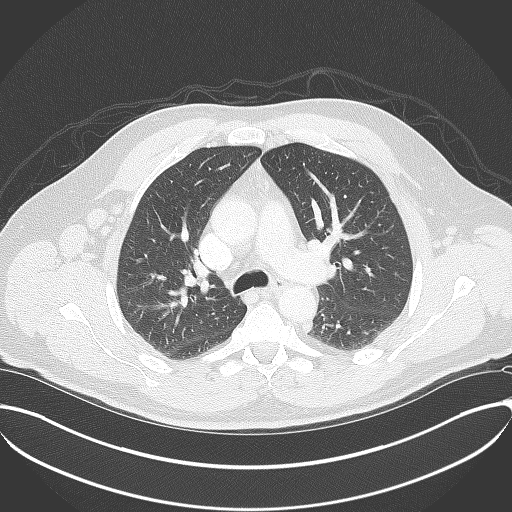
[im 118/148  lung]
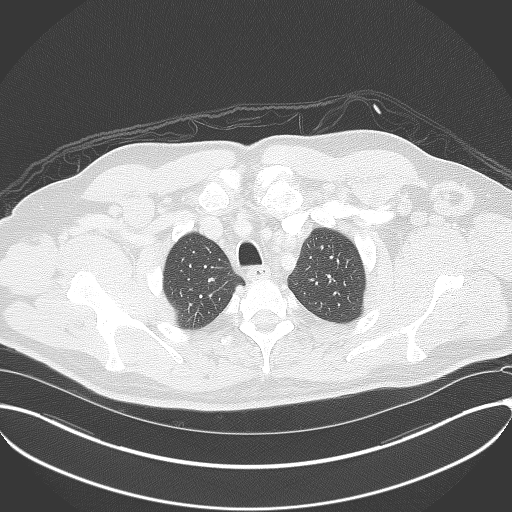

[Series 6: coronals · coronal · 0.95mm/px · 1 of 157 slices shown]
[im 79/157  lung]
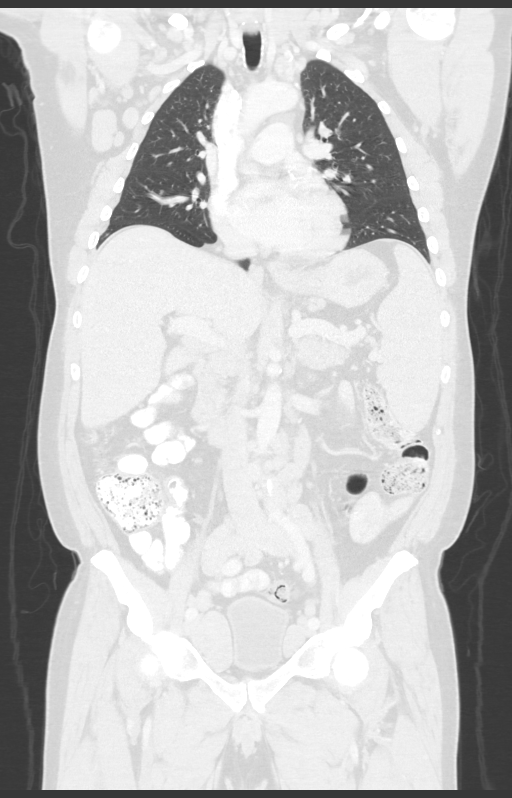

[Series 8: axial neck · axial · 0.53mm/px · z∈[-318,-168]mm · 4 of 127 slices shown]
[im 26/127  lung]
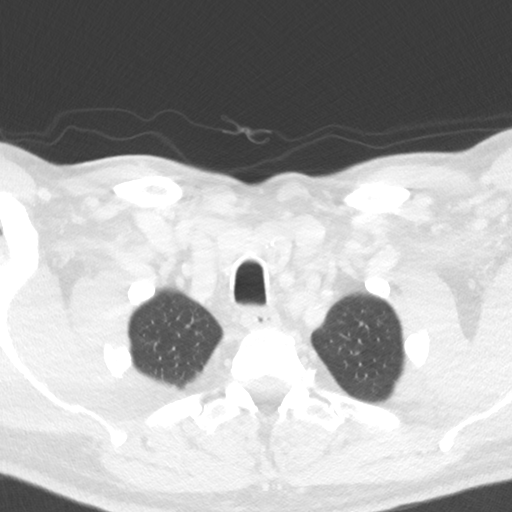
[im 51/127  lung]
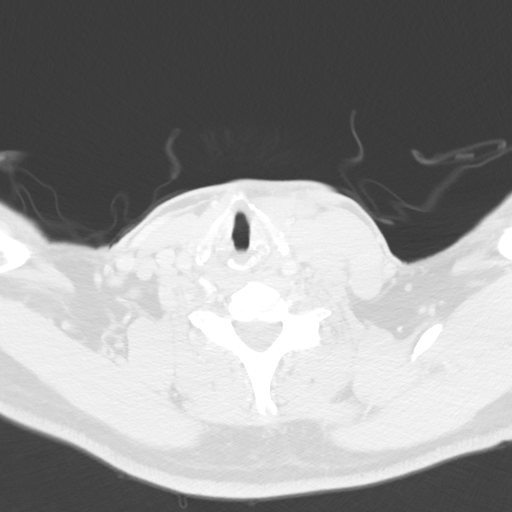
[im 76/127  lung]
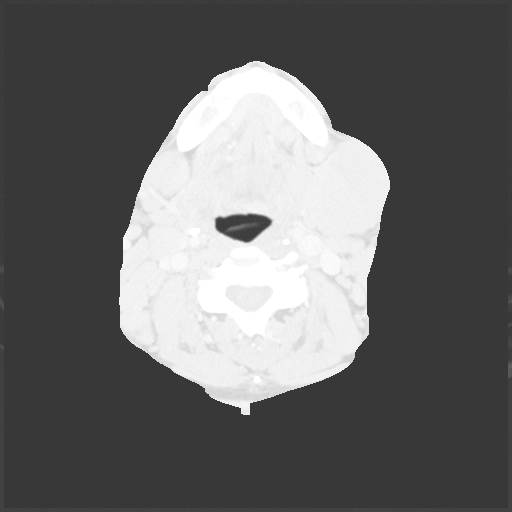
[im 101/127  lung]
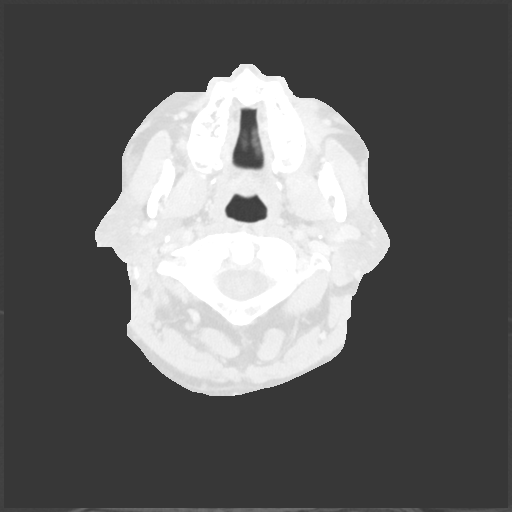

[Series 14: orthogonal ax · axial · 0.39mm/px · z∈[-319,-269]mm · 2 of 127 slices shown]
[im 26/127  lung]
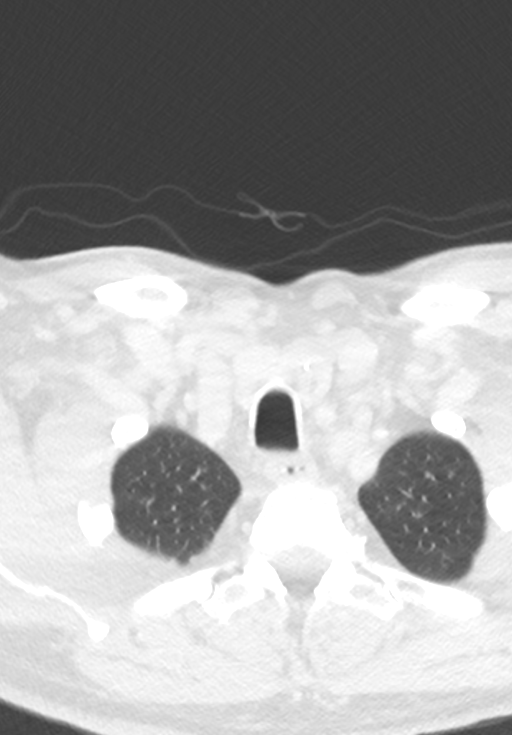
[im 51/127  lung]
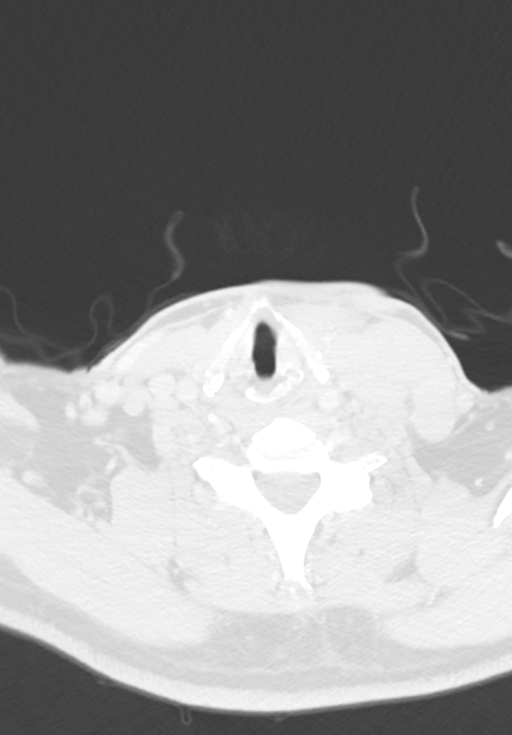

[15 of 36 positions shown; findings below may reference images not displayed]

FINDINGS: CT CHEST FINDINGS

Cardiovascular: Heart is enlarged. Trace fluid superior pericardial
recess. Coronary arterial vascular calcifications. Aorta and main
pulmonary artery normal in caliber.

Mediastinum/Nodes: Bulky axillary, mediastinal and supraclavicular
adenopathy is demonstrated. There is a 3.5 cm left supraclavicular
lymph node (image 3; series 3), a 1.5 cm left axillary lymph node
(image 18; series 3), a 2.3 cm right axillary lymph node (image 24;
series 3) and a 1.8 cm subcarinal lymph node (image 33; series 3).

Lungs/Pleura: Central airways are patent. Stable 6 mm right lower
lobe nodule (image 102; series 5). There is a 4 mm right upper lobe
nodule (image 52; series 5). Stable 5 mm left upper lobe nodule
(image 76; series 5). Stable 4 mm subpleural left lower lobe nodule
(image 106; series 5). No pleural effusion or pneumothorax.

Musculoskeletal: Thoracic spine degenerative changes. No aggressive
or acute appearing osseous lesions.

CT ABDOMEN PELVIS FINDINGS

Hepatobiliary: Stable subcentimeter low-attenuation lesion hepatic
dome (image 47; series 3). Gallbladder is unremarkable. No
intrahepatic or extrahepatic biliary ductal dilatation. Stable 10 mm
low-attenuation lesion adjacent to the caudate lobe (image 59;
series 3).

Pancreas: Unremarkable

Spleen: Enlarged measuring 19.6 cm.

Adrenals/Urinary Tract: The adrenal glands are normal. There is a
3.5 cm cyst within the superior pole of the left kidney. No
hydronephrosis. Urinary bladder is unremarkable.

Stomach/Bowel: Normal morphology of the stomach. No evidence for
bowel obstruction. No free fluid or free intraperitoneal air. Normal
appendix.

Vascular/Lymphatic: Normal caliber abdominal aorta. Many of the
enlarged retroperitoneal lymph nodes are stable or have have
decreased in size when compared to prior exam including a 1.5 cm
left periaortic lymph node (image 82; series 3), previously
measuring 2.3 cm. A 3.7 cm right pelvic sidewall lymph node (image
122; series 3) previously measured 5.1 cm. A 2.0 cm left pelvic
sidewall lymph node (image 122; series 3) previously measured
cm. Interval increase in size of inguinal adenopathy including a
cm right inguinal node (image 127; series 3), previously 2.9 cm and
a 3.9 cm left inguinal node (image 129; series 3), previously
cm. Interval increase in size of left inguinal lymph node measuring
2.0 cm (image 135; series 3), previously 1.4 cm. Interval decrease
in size of right inguinal lymph node measuring 1.4 cm (image 136;
series 3), previously 2.3 cm.

Reproductive: Heterogeneous prostate.

Other: Small fat containing right inguinal hernia.

Musculoskeletal: Lumbar spine degenerative changes. No aggressive or
acute appearing osseous lesions. Interval increase in size of soft
tissue nodule overlying the right flank (image 78; series 3),
measuring 3.0 cm, previously 2 3 cm.
IMPRESSION: 1. Extensive adenopathy demonstrated throughout the chest, abdomen
and pelvis compatible with history of lymphoma. No prior chest CTs
are available for comparison. There are lymph nodes which have
decreased in size and others which have increased in size when
compared to prior exam.
2. Multiple bilateral pulmonary nodules are demonstrated. The
nodules that were visualized on prior CT are stable.
3. Splenomegaly.

## 2019-05-24 ENCOUNTER — Telehealth (HOSPITAL_COMMUNITY): Payer: Self-pay | Admitting: Pharmacist

## 2019-05-24 MED FILL — IMBRUVICA 420 MG TAB: 420 | 28 days supply | Qty: 28 | Fill #2

## 2019-05-24 NOTE — Telephone Encounter (Signed)
Oral Oncology Pharmacist Encounter   Received notification from [name] that reauthorization for Adam Reyes is required.   PA submitted through [employer] portal. Prior Authorization for Adam Reyes has been approved.    Auth# [id number] Effective dates: 05/24/2019 through 01/19/2020  [name] notified of the approval   Darl Pikes, PharmD, BCPS, [facility] Hematology/Oncology Clinical Pharmacist [facility] [phone]  05/24/2019 10:26 AM

## 2019-05-31 ENCOUNTER — Other Ambulatory Visit: Payer: Self-pay | Admitting: Cardiology

## 2019-06-01 ENCOUNTER — Ambulatory Visit (INDEPENDENT_AMBULATORY_CARE_PROVIDER_SITE_OTHER): Payer: Managed Care, Other (non HMO) | Admitting: Cardiology

## 2019-06-01 ENCOUNTER — Encounter: Payer: Self-pay | Admitting: Cardiology

## 2019-06-01 ENCOUNTER — Other Ambulatory Visit: Payer: Self-pay

## 2019-06-01 VITALS — BP 150/90 | HR 64 | Temp 97.0°F | Ht 76.0 in | Wt 267.4 lb

## 2019-06-01 DIAGNOSIS — I2510 Atherosclerotic heart disease of native coronary artery without angina pectoris: Secondary | ICD-10-CM | POA: Diagnosis not present

## 2019-06-01 DIAGNOSIS — I951 Orthostatic hypotension: Secondary | ICD-10-CM | POA: Diagnosis not present

## 2019-06-01 DIAGNOSIS — I2119 ST elevation (STEMI) myocardial infarction involving other coronary artery of inferior wall: Secondary | ICD-10-CM

## 2019-06-01 DIAGNOSIS — R030 Elevated blood-pressure reading, without diagnosis of hypertension: Secondary | ICD-10-CM

## 2019-06-01 DIAGNOSIS — E785 Hyperlipidemia, unspecified: Secondary | ICD-10-CM

## 2019-06-01 MED ORDER — ATORVASTATIN CALCIUM 40 MG PO TABS: 40.0000 mg | ORAL_TABLET | Freq: Every day | ORAL | 3 refills | Status: DC

## 2019-06-01 MED ORDER — NITROGLYCERIN 0.4 MG SL SUBL: 0.4000 mg | SUBLINGUAL_TABLET | SUBLINGUAL | 6 refills | Status: DC | PRN

## 2019-06-01 MED ORDER — METOPROLOL TARTRATE 25 MG PO TABS: ORAL_TABLET | ORAL | 3 refills | Status: DC

## 2019-06-01 NOTE — Patient Instructions (Signed)
Medication Instructions:  MEDICATION REFILLED *If you need a refill on your cardiac medications before your next appointment, please call your pharmacy*  Lab Work: NOT NEEDED  Testing/Procedures: NOT NEEDED  Follow-Up: At [facility], you and your health needs are our priority.  As part of our continuing mission to provide you with exceptional heart care, we have created designated Provider Care Teams.  These Care Teams include your primary Cardiologist (physician) and Advanced Practice Providers (APPs -  Physician Assistants and Nurse Practitioners) who all work together to provide you with the care you need, when you need it.  Your next appointment:   12 months  The format for your next appointment:   In Person  Provider:   Glenetta Hew, MD  Other Instructions

## 2019-06-01 NOTE — Progress Notes (Signed)
Primary Care Provider: Asencion Noble, MD Cardiologist: Glenetta Hew, MD Electrophysiologist:   Clinic Note: Chief Complaint  Patient presents with  . Follow-up    Was rushing this morning.  A little stressed out about running late.  . Coronary Artery Disease    No angina    HPI:    Adam Reyes is a 63 y.o. male with a h/o MI - Borderline CAD (Med Rx) and CLL who presents today for annual f/u.   MI hx:  subtle possible ST elevation MI versus non-STEMI. -- Sx was "a little chest pain that did not go away & some shortness of breath".  -->  Cath showed moderate bifurcation LAD-D1 disease that was non-flow-limiting.  Plan was to treat medically.    Evaluated with GXT stress test that was negative for ischemia @ 9 min.  [name] was last seen on 04/21/2018 -patient was in response to possible syncopal event that was thought to be related to heat exhaustion and dehydration.  Had not had any further symptoms.  And no further cardiac symptoms.  Recent Hospitalizations: None  Reviewed  CV studies:    The following studies were reviewed today: (if available, images/films reviewed: From Epic Chart or Care Everywhere) . No new tests:   Interval History:   Adam Reyes returns for annual follow-up doing quite well.  He tries to walk just but every day for about 30 minutes a day.  Does not always get it done, but does the best he can.  This morning he is a little bit stressed out because he spent the morning hunting deer and actually had a good day with a 9 point [place].  He had to rush to the butcher shop to take the deer there and barely got here in time for his appointment.  He was also stressed out because he got here a little bit late and thinks is probably reason why his blood pressure is high.  Usually at home it is much better than this.  With the amount of exertion he does, he is not noticing any problems at all with chest tightness and pressure.  He is not having exertional  dyspnea unless he really pushes it which is not unexpected.  He seems to be tolerating his medications quite well.  Is in good spirits overall.   CV Review of Symptoms (Summary) no chest pain or dyspnea on exertion negative for - edema, irregular heartbeat, orthopnea, palpitations, paroxysmal nocturnal dyspnea, rapid heart rate, shortness of breath or Syncope/near syncope, TIA shows amaurosis fugax, claudication  The patient does not have symptoms concerning for COVID-19 infection (fever, chills, cough, or new shortness of breath).  The patient is practicing social distancing. ++ Masking.  He does go out for groceries/shopping, but is careful respecting distance and wearing a mask.  Hand sensation.[name]  He still goes to work and is very careful with similar activity while at work.  Feels safe in the current environment.   REVIEWED OF SYSTEMS   A comprehensive ROS was performed. Review of Systems  Constitutional: Positive for weight loss (Just a little bit.  He has made some adjustments to his diet). Negative for malaise/fatigue.  HENT: Negative for congestion and nosebleeds.   Respiratory: Negative for cough and shortness of breath.   Cardiovascular: Negative for claudication.  Gastrointestinal: Positive for heartburn (Sometimes it be for long foods). Negative for blood in stool and melena.  Genitourinary: Negative for hematuria.  Musculoskeletal: Positive for joint pain (  Normal arthritis pains). Negative for back pain and falls.  Neurological: Negative for dizziness, weakness and headaches.  Endo/Heme/Allergies: Positive for environmental allergies. Bruises/bleeds easily.  Psychiatric/Behavioral: Negative for depression and memory loss. The patient is not nervous/anxious and does not have insomnia.   All other systems reviewed and are negative.  I have reviewed and (if needed) personally updated the patient's problem list, medications, allergies, past medical and surgical history, social and  family history.   PAST MEDICAL HISTORY   Past Medical History:  Diagnosis Date  . 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 Enlarged lymph node    left neck  . STEMI (ST elevation myocardial infarction) ([facility]) 03/05/2017   hx/notes 03/05/2017 -likely aborted anterior STEMI with 50% bifurcation LAD-Diag1.  No PCI.  Preserved EF     PAST SURGICAL HISTORY   Past Surgical History:  Procedure Laterality Date  . COLONOSCOPY N/A 10/11/2015   Procedure: COLONOSCOPY;  Surgeon: Rogene Houston, MD;  [facility];  Service: Endoscopy;  Laterality: N/A;  10/11/2015  . Graded Exercise Tolerance Test (GXT/ETT)  05/2017   10.7 METs (9: 25 min.  Reached 103% max predicted heart rate).  No EKG findings to suggest coronary ischemia.  Negative, low risk GXT  . LEFT HEART CATH AND CORONARY ANGIOGRAPHY N/A 03/05/2017   Procedure: LEFT HEART CATH AND CORONARY ANGIOGRAPHY;  Surgeon: Leonie Man, MD;  Location: [facility];  Service: Cardiovascular: pLAD-Diag1 50-55% (non-flow-limiiting).  EF ~50-55%.  although bifurcation lesion was presumed Culprit - no PCI (not flow limiting). - Med Rx.  [name] MASS BIOPSY Left 06/27/2015   Procedure: OPEN LEFT NECK BIOPSY ;  Surgeon: Leta Baptist, MD;  Location: [facility];  Service: ENT;  Laterality: Left;  . REFRACTIVE SURGERY Bilateral      MEDICATIONS/ALLERGIES   Current Meds  Medication Sig  . aspirin 81 MG chewable tablet Chew 1 tablet (81 mg total) by mouth every other day.  . diphenhydrAMINE (BENADRYL) 25 mg capsule Take 25 mg by mouth at bedtime.  . IMBRUVICA 420 MG TABS TAKE 1 TABLET (420 MG) BY MOUTH DAILY.  . metoprolol tartrate (LOPRESSOR) 25 MG tablet TAKE 1/2 TABLET(12.5 MG) BY MOUTH TWICE DAILY  . naproxen sodium (ANAPROX) 550 MG tablet TK 1 T PO Q 12 H PRN  . [DISCONTINUED]  atorvastatin (LIPITOR) 80 MG tablet Take 0.5 tablets (40 mg total) by mouth daily at 6 PM.  . [DISCONTINUED] metoprolol tartrate (LOPRESSOR) 25 MG tablet TAKE 1/2 TABLET(12.5 MG) BY MOUTH TWICE DAILY    No Known Allergies   SOCIAL HISTORY/FAMILY HISTORY   Social History   Tobacco Use  . Smoking status: Former Smoker    Years: 2.00    Types: Cigarettes  . Smokeless tobacco: Former Systems developer    Types: Chew, Snuff  . Tobacco comment: 03/06/2017 "quit smoking when I was young; quit chew/snuff in ~ 2008"  Substance Use Topics  . Alcohol use: Yes    Comment: 03/06/2017 "maybe 6 pack beer/month"  . Drug use: No   Social History   Social History Narrative  . Not on file    Family History family history includes Cancer in his brother and father; Diabetes in his mother.   OBJCTIVE -PE, EKG, labs   Wt Readings from Last 3 Encounters:  06/01/19 267 lb 6.4 oz (  121.3 kg)  03/31/19 275 lb 3.2 oz (124.8 kg)  09/16/18 278 lb 3.2 oz (126.2 kg)  04/2018 - 273  Physical Exam: BP (!) 150/90   Pulse 64   Temp (!) 97 F (36.1 C)   Ht 6\' 4"  (1.93 m)   Wt 267 lb 6.4 oz (121.3 kg)   SpO2 97%   BMI 32.55 kg/m  Physical Exam  Constitutional: He is oriented to person, place, and time. He appears well-developed and well-nourished. No distress.  Mildly obese.  Well-groomed.  Healthy-appearing.  HENT:  Head: Normocephalic and atraumatic.  Neck: Normal range of motion. Neck supple. No hepatojugular reflux and no JVD present. Carotid bruit is not present.  Cardiovascular: Normal rate, regular rhythm, normal heart sounds and intact distal pulses.  No extrasystoles are present. PMI is not displaced (Difficult to palpate). Exam reveals no gallop and no friction rub.  No murmur heard. Pulmonary/Chest: Effort normal and breath sounds normal. No respiratory distress. He has no wheezes. He has no rales. He exhibits no tenderness.  Abdominal: Soft. Bowel sounds are normal. He exhibits no distension. There  is no abdominal tenderness. There is no rebound.  Mild truncal obesity  Musculoskeletal: Normal range of motion.        General: No edema.  Neurological: He is alert and oriented to person, place, and time. No cranial nerve deficit.  Psychiatric: He has a normal mood and affect. His behavior is normal. Judgment and thought content normal.  In great spirits.  Seems very happy  Vitals reviewed.    Adult ECG Report  Rate: 64 ;  Rhythm: normal sinus rhythm and Normal axis, intervals and durations.;   Narrative Interpretation: Stable EKG  Recent Labs:    Lab Results  Component Value Date   CHOL 111 11/18/2018   HDL 39 (L) 11/18/2018   LDLCALC 54 11/18/2018   TRIG 90 11/18/2018   CHOLHDL 2.8 11/18/2018   Lab Results  Component Value Date   CREATININE 0.92 03/26/2019   BUN 18 03/26/2019   NA 138 03/26/2019   K 3.5 03/26/2019   CL 105 03/26/2019   CO2 25 03/26/2019    ASSESSMENT/PLAN    Problem List Items Addressed This Visit    ST elevation myocardial infarction (STEMI) of inferolateral wall, initial episode of care [facility]) (Chronic)    He pretty much had an aborted anterior STEMI.  Had moderate nonischemic LAD bifurcation disease. No longer on clopidogrel.  Is on aspirin statin and beta-blocker.  No use of nitroglycerin.      Relevant Medications   metoprolol tartrate (LOPRESSOR) 25 MG tablet   atorvastatin (LIPITOR) 40 MG tablet   nitroGLYCERIN (NITROSTAT) 0.4 MG SL tablet   Coronary artery disease involving native coronary artery of native heart without angina pectoris - Primary (Chronic)    Medical management of moderate LAD-D1 bifurcation lesion.  Was evaluated with a GXT at 9 minutes he had no angina.  This would indicate excellent treatment results with aspirin and high intensity statin. No nitroglycerin requirement.  We have backed off on his beta-blocker because of fatigue and he is doing much better.  Blood pressure may be little bit high today, but if it continues  to be elevated may need to consider ARB or calcium channel blocker, but for now would simply monitor.      Relevant Medications   metoprolol tartrate (LOPRESSOR) 25 MG tablet   atorvastatin (LIPITOR) 40 MG tablet   nitroGLYCERIN (NITROSTAT) 0.4 MG SL tablet   Other  Relevant Orders   EKG 12-Lead (Completed)   Dyslipidemia, goal LDL below 70 (Chronic)    Excellent labs from May 2020.  Is on 40 mg atorvastatin and doing well.  Has also made some dietary adjustments.  No change.  Monitor for side effects.      Relevant Medications   metoprolol tartrate (LOPRESSOR) 25 MG tablet   atorvastatin (LIPITOR) 40 MG tablet   nitroGLYCERIN (NITROSTAT) 0.4 MG SL tablet   Elevated blood pressure reading (Chronic)    He probably truly does have hypertension.  Pressure is high today but he has a good reason for it.  I think for now we can monitor.  If he does have high blood pressure when he is seen by his PCP, would potentially consider adding an ARB or potentially calcium channel blocker such as amlodipine for additional blood pressure control.  I would not push the beta-blocker any further because he has pretty low resting heart rate on low-dose beta-blocker      Syncope due to orthostatic hypotension    No further symptoms.  Blood pressure is stable a little high today.  Plan was to allow for mild permissive hypertension. Ensuring adequate hydration and making sure that he eats enough.  Avoiding excess heat and exertion.      Relevant Medications   metoprolol tartrate (LOPRESSOR) 25 MG tablet   atorvastatin (LIPITOR) 40 MG tablet   nitroGLYCERIN (NITROSTAT) 0.4 MG SL tablet       COVID-19 Education: The signs and symptoms of COVID-19 were discussed with the patient and how to seek care for testing (follow up with PCP or arrange E-visit).   The importance of social distancing was discussed today.  I spent a total of 12 minutes with the patient and chart review. >  50% of the time was spent  in direct patient consultation.  Additional time spent with chart review (studies, outside notes, etc): 6 Total Time: 21min   Current medicines are reviewed at length with the patient today.  (+/- concerns) n/a   Patient Instructions / Medication Changes & Studies & Tests Ordered   Patient Instructions  Medication Instructions:  MEDICATION REFILLED *If you need a refill on your cardiac medications before your next appointment, please call your pharmacy*  Lab Work: NOT NEEDED  Testing/Procedures: NOT NEEDED  Follow-Up: At [facility], you and your health needs are our priority.  As part of our continuing mission to provide you with exceptional heart care, we have created designated Provider Care Teams.  These Care Teams include your primary Cardiologist (physician) and Advanced Practice Providers (APPs -  Physician Assistants and Nurse Practitioners) who all work together to provide you with the care you need, when you need it.  Your next appointment:   12 months  The format for your next appointment:   In Person  Provider:   Glenetta Hew, MD  Other Instructions   Studies Ordered:   Orders Placed This Encounter  Procedures  . EKG 12-Lead     Glenetta Hew, M.D., M.S. Interventional Cardiologist   Pager # [phone] Phone # [phone] [address] [place], [place] [address]   Thank you for choosing [facility] at [facility]!!

## 2019-06-03 ENCOUNTER — Encounter: Payer: Self-pay | Admitting: Cardiology

## 2019-06-03 DIAGNOSIS — R030 Elevated blood-pressure reading, without diagnosis of hypertension: Secondary | ICD-10-CM | POA: Insufficient documentation

## 2019-06-03 NOTE — Assessment & Plan Note (Signed)
Medical management of moderate LAD-D1 bifurcation lesion.  Was evaluated with a GXT at 9 minutes he had no angina.  This would indicate excellent treatment results with aspirin and high intensity statin. No nitroglycerin requirement.  We have backed off on his beta-blocker because of fatigue and he is doing much better.  Blood pressure may be little bit high today, but if it continues to be elevated may need to consider ARB or calcium channel blocker, but for now would simply monitor.

## 2019-06-03 NOTE — Assessment & Plan Note (Signed)
He probably truly does have hypertension.  Pressure is high today but he has a good reason for it.  I think for now we can monitor.  If he does have high blood pressure when he is seen by his PCP, would potentially consider adding an ARB or potentially calcium channel blocker such as amlodipine for additional blood pressure control.  I would not push the beta-blocker any further because he has pretty low resting heart rate on low-dose beta-blocker

## 2019-06-03 NOTE — Assessment & Plan Note (Signed)
Excellent labs from May 2020.  Is on 40 mg atorvastatin and doing well.  Has also made some dietary adjustments.  No change.  Monitor for side effects.

## 2019-06-03 NOTE — Assessment & Plan Note (Signed)
No further symptoms.  Blood pressure is stable a little high today.  Plan was to allow for mild permissive hypertension. Ensuring adequate hydration and making sure that he eats enough.  Avoiding excess heat and exertion.

## 2019-06-03 NOTE — Assessment & Plan Note (Signed)
He pretty much had an aborted anterior STEMI.  Had moderate nonischemic LAD bifurcation disease. No longer on clopidogrel.  Is on aspirin statin and beta-blocker.  No use of nitroglycerin.

## 2019-06-18 MED FILL — IMBRUVICA 420 MG TAB: 420 | 28 days supply | Qty: 28 | Fill #3

## 2019-06-30 ENCOUNTER — Inpatient Hospital Stay (HOSPITAL_COMMUNITY): Payer: Managed Care, Other (non HMO) | Attending: Hematology

## 2019-06-30 ENCOUNTER — Other Ambulatory Visit: Payer: Self-pay

## 2019-06-30 ENCOUNTER — Ambulatory Visit (HOSPITAL_COMMUNITY)
Admission: RE | Admit: 2019-06-30 | Discharge: 2019-06-30 | Disposition: A | Discharge status: Home / Self Care | Payer: Managed Care, Other (non HMO) | Source: Ambulatory Visit | Attending: Hematology | Admitting: Hematology

## 2019-06-30 DIAGNOSIS — C9110 Chronic lymphocytic leukemia of B-cell type not having achieved remission: Secondary | ICD-10-CM | POA: Diagnosis present

## 2019-06-30 DIAGNOSIS — C8300 Small cell B-cell lymphoma, unspecified site: Secondary | ICD-10-CM | POA: Diagnosis present

## 2019-06-30 DIAGNOSIS — D696 Thrombocytopenia, unspecified: Secondary | ICD-10-CM | POA: Insufficient documentation

## 2019-06-30 LAB — CBC WITH DIFFERENTIAL/PLATELET
Abs Immature Granulocytes: 0.07 10*3/uL (ref 0.00–0.07)
Basophils Absolute: 0 10*3/uL (ref 0.0–0.1)
Basophils Relative: 1 %
Eosinophils Absolute: 0.1 10*3/uL (ref 0.0–0.5)
Eosinophils Relative: 2 %
HCT: 48.7 % (ref 39.0–52.0)
Hemoglobin: 16.3 g/dL (ref 13.0–17.0)
Immature Granulocytes: 1 %
Lymphocytes Relative: 12 %
Lymphs Abs: 0.6 10*3/uL — ABNORMAL LOW (ref 0.7–4.0)
MCH: 31.9 pg (ref 26.0–34.0)
MCHC: 33.5 g/dL (ref 30.0–36.0)
MCV: 95.3 fL (ref 80.0–100.0)
Monocytes Absolute: 0.5 10*3/uL (ref 0.1–1.0)
Monocytes Relative: 10 %
Neutro Abs: 3.9 10*3/uL (ref 1.7–7.7)
Neutrophils Relative %: 74 %
Platelets: 132 10*3/uL — ABNORMAL LOW (ref 150–400)
RBC: 5.11 MIL/uL (ref 4.22–5.81)
RDW: 12.1 % (ref 11.5–15.5)
WBC: 5.2 10*3/uL (ref 4.0–10.5)
nRBC: 0 % (ref 0.0–0.2)

## 2019-06-30 LAB — COMPREHENSIVE METABOLIC PANEL
ALT: 30 U/L (ref 0–44)
AST: 28 U/L (ref 15–41)
Albumin: 4.3 g/dL (ref 3.5–5.0)
Alkaline Phosphatase: 59 U/L (ref 38–126)
Anion gap: 9 (ref 5–15)
BUN: 18 mg/dL (ref 8–23)
CO2: 30 mmol/L (ref 22–32)
Calcium: 9.1 mg/dL (ref 8.9–10.3)
Chloride: 101 mmol/L (ref 98–111)
Creatinine, Ser: 1.03 mg/dL (ref 0.61–1.24)
GFR calc Af Amer: >60 mL/min (ref >60)
GFR calc non Af Amer: >60 mL/min (ref >60)
Glucose, Bld: 121 mg/dL — ABNORMAL HIGH (ref 70–99)
Potassium: 3.6 mmol/L (ref 3.5–5.1)
Sodium: 140 mmol/L (ref 135–145)
Total Bilirubin: 1.5 mg/dL — ABNORMAL HIGH (ref 0.3–1.2)
Total Protein: 6.7 g/dL (ref 6.5–8.1)

## 2019-06-30 LAB — LACTATE DEHYDROGENASE: LDH: 166 U/L (ref 98–192)

## 2019-06-30 MED ORDER — IOHEXOL 300 MG/ML  SOLN
100.0000 mL | Freq: Once | INTRAMUSCULAR | Status: AC | PRN
  Administered 2019-06-30: 13:00:00 100 mL via INTRAVENOUS

## 2019-07-01 ENCOUNTER — Inpatient Hospital Stay (HOSPITAL_BASED_OUTPATIENT_CLINIC_OR_DEPARTMENT_OTHER): Payer: Managed Care, Other (non HMO) | Admitting: Hematology

## 2019-07-01 ENCOUNTER — Encounter (HOSPITAL_COMMUNITY): Payer: Self-pay | Admitting: Hematology

## 2019-07-01 DIAGNOSIS — C8300 Small cell B-cell lymphoma, unspecified site: Secondary | ICD-10-CM

## 2019-07-01 DIAGNOSIS — C9110 Chronic lymphocytic leukemia of B-cell type not having achieved remission: Secondary | ICD-10-CM | POA: Diagnosis not present

## 2019-07-01 DIAGNOSIS — D696 Thrombocytopenia, unspecified: Secondary | ICD-10-CM | POA: Diagnosis not present

## 2019-07-01 NOTE — Patient Instructions (Addendum)
[place] at [facility] Discharge Instructions  You were seen today by Dr. Delton Coombes. He went over your recent lab and scan results. Your pill can cause dry skin so try using a moisturizing lotion to help with the itching and dryness. He will see you back in 4 months for labs and follow up.   Thank you for choosing [facility] to provide your oncology and hematology care.  To afford each patient quality time with our provider, please arrive at least 15 minutes before your scheduled appointment time.   If you have a lab appointment with the [facility] please come in thru the  Main Entrance and check in at the main information desk  You need to re-schedule your appointment should you arrive 10 or more minutes late.  We strive to give you quality time with our providers, and arriving late affects you and other patients whose appointments are after yours.  Also, if you no show three or more times for appointments you may be dismissed from the clinic at the providers discretion.     Again, thank you for choosing [facility].  Our hope is that these requests will decrease the amount of time that you wait before being seen by our physicians.       _____________________________________________________________  Should you have questions after your visit to [facility], please contact our office at [phone] between the hours of 8:00 a.m. and 4:30 p.m.  Voicemails left after 4:00 p.m. will not be returned until the following business day.  For prescription refill requests, have your pharmacy contact our office and allow 72 hours.    Cancer Center Support Programs:   > Cancer Support Group  2nd Tuesday of the month 1pm-2pm, Journey Room

## 2019-07-01 NOTE — Progress Notes (Signed)
Adam Reyes, [place] [address]   CLINIC:  Medical Oncology/Hematology  PCP:  Asencion Noble, [place] [address] [phone]   REASON FOR VISIT: Follow-up for chronic lymphocytic leukemia (CLL)  CURRENT THERAPY:Ibrutinib 420 mg daily   BRIEF ONCOLOGIC HISTORY:  Oncology History  Chronic lymphocytic leukemia (CLL), B-cell ([facility])  06/14/2015 Imaging   CT neck- Bulky adenopathy throughout the neck bilaterally. There also are enlarged parotid lymph nodes bilaterally. There is bilateral axillary adenopathy as well as mediastinal adenopathy. Findings are consistent with lymphoma. Biopsy recommended.   06/27/2015 Procedure   Left neck lymph node biopsy by Dr. Benjamine Mola   06/27/2015 Pathology Results   Diagnosis Lymph node for lymphoma, Left neck node for lymphoma work up - [place].  LOW GRADE.   06/27/2015 Pathology Results   Tissue-Flow Cytometry - MONOCLONAL B CELL POPULATION IDENTIFIED. The phenotypic features are consistent with small lymphocytic lymphoma/chronic lymphocytic leukemia and correlate well with the morphology in the lymph node   Lymphoma, small lymphocytic ([facility])  10/14/2017 Initial Diagnosis   Lymphoma, small lymphocytic ([facility])   12/25/2017 -  Chemotherapy   The patient had riTUXimab (RITUXAN) 900 mg in sodium chloride 0.9 % 250 mL (2.6471 mg/mL) infusion, 375 mg/m2 = 900 mg, Intravenous,  Once, 6 of 6 cycles Dose modification: 500 mg/m2 (original dose 500 mg/m2, Cycle 2, Reason: Provider Judgment) Administration: 900 mg (01/01/2018), 1,300 mg (01/29/2018), 1,300 mg (02/26/2018), 1,300 mg (03/26/2018), 1,300 mg (04/23/2018)  for chemotherapy treatment.       INTERVAL HISTORY:  Adam Reyes 63 y.o. male seen for follow-up of CLL/SLL.  He is continuing to work full-time job.  He is tolerating ibrutinib very well.  Denies any nosebleeds, hematuria, bleeding per rectum.  Denies any easy bruising.   Appetite and energy levels are 100%.  Denies any fevers or infections.  No night sweats or weight loss reported in the last 6 months.  Slight itching on the anterior abdominal wall was reported.  REVIEW OF SYSTEMS:  Review of Systems  Skin: Positive for itching and rash.  All other systems reviewed and are negative.    PAST MEDICAL/SURGICAL HISTORY:  Past Medical History:  Diagnosis Date  . 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 Enlarged lymph node    left neck  . STEMI (ST elevation myocardial infarction) ([facility]) 03/05/2017   hx/notes 03/05/2017 -likely aborted anterior STEMI with 50% bifurcation LAD-Diag1.  No PCI.  Preserved EF   Past Surgical History:  Procedure Laterality Date  . COLONOSCOPY N/A 10/11/2015   Procedure: COLONOSCOPY;  Surgeon: Rogene Houston, MD;  [facility];  Service: Endoscopy;  Laterality: N/A;  10/11/2015  . Graded Exercise Tolerance Test (GXT/ETT)  05/2017   10.7 METs (9: 25 min.  Reached 103% max predicted heart rate).  No EKG findings to suggest coronary ischemia.  Negative, low risk GXT  . LEFT HEART CATH AND CORONARY ANGIOGRAPHY N/A 03/05/2017   Procedure: LEFT HEART CATH AND CORONARY ANGIOGRAPHY;  Surgeon: Leonie Man, MD;  Location: [facility];  Service: Cardiovascular: pLAD-Diag1 50-55% (non-flow-limiiting).  EF ~50-55%.  although bifurcation lesion was presumed Culprit - no PCI (not flow limiting). - Med Rx.  [name] MASS BIOPSY Left 06/27/2015   Procedure: OPEN LEFT NECK BIOPSY ;  Surgeon:  Leta Baptist, MD;  Location: [facility];  Service: ENT;  Laterality: Left;  . REFRACTIVE SURGERY Bilateral      SOCIAL HISTORY:  Social History   Socioeconomic History  . Marital status: Married    Spouse name: Not on file  . Number of children: Not on file  . Years of education:  Not on file  . Highest education level: Not on file  Occupational History  . Not on file  Tobacco Use  . Smoking status: Former Smoker    Years: 2.00    Types: Cigarettes  . Smokeless tobacco: Former Systems developer    Types: Chew, Snuff  . Tobacco comment: 03/06/2017 "quit smoking when I was young; quit chew/snuff in ~ 2008"  Substance and Sexual Activity  . Alcohol use: Yes    Comment: 03/06/2017 "maybe 6 pack beer/month"  . Drug use: No  . Sexual activity: Yes  Other Topics Concern  . Not on file  Social History Narrative  . Not on file   Social Determinants of Health   Financial Resource Strain:   . Difficulty of Paying Living Expenses: Not on file  Food Insecurity:   . Worried About Charity fundraiser in the Last Year: Not on file  . Ran Out of Food in the Last Year: Not on file  Transportation Needs:   . Lack of Transportation (Medical): Not on file  . Lack of Transportation (Non-Medical): Not on file  Physical Activity:   . Days of Exercise per Week: Not on file  . Minutes of Exercise per Session: Not on file  Stress:   . Feeling of Stress : Not on file  Social Connections:   . Frequency of Communication with Friends and Family: Not on file  . Frequency of Social Gatherings with Friends and Family: Not on file  . Attends Religious Services: Not on file  . Active Member of Clubs or Organizations: Not on file  . Attends Archivist Meetings: Not on file  . Marital Status: Not on file  Intimate Partner Violence:   . Fear of Current or Ex-Partner: Not on file  . Emotionally Abused: Not on file  . Physically Abused: Not on file  . Sexually Abused: Not on file    FAMILY HISTORY:  Family History  Problem Relation Age of Onset  . Diabetes Mother   . Cancer Father   . Cancer Brother     CURRENT MEDICATIONS:  Outpatient Encounter Medications as of 07/01/2019  Medication Sig  . aspirin 81 MG chewable tablet Chew 1 tablet (81 mg total) by mouth every other day.   [name] atorvastatin (LIPITOR) 40 MG tablet Take 1 tablet (40 mg total) by mouth daily.  . diphenhydrAMINE (BENADRYL) 25 mg capsule Take 25 mg by mouth at bedtime.  . IMBRUVICA 420 MG TABS TAKE 1 TABLET (420 MG) BY MOUTH DAILY.  . metoprolol tartrate (LOPRESSOR) 25 MG tablet TAKE 1/2 TABLET(12.5 MG) BY MOUTH TWICE DAILY  . naproxen sodium (ANAPROX) 550 MG tablet TK 1 T PO Q 12 H PRN  . nitroGLYCERIN (NITROSTAT) 0.4 MG SL tablet Place 1 tablet (0.4 mg total) under the tongue every 5 (five) minutes as needed for chest pain. (Patient not taking: Reported on 07/01/2019)  . [DISCONTINUED] ascorbic acid (VITAMIN C) 250 MG tablet Take by mouth.   No facility-administered encounter medications on file as of 07/01/2019.    ALLERGIES:  No Known Allergies   PHYSICAL EXAM:  ECOG Performance status: 1  Vitals:   07/01/19 1518  BP: (!) 152/79  Pulse: 63  Resp: 18  Temp: 97.9 F (36.6 C)  SpO2: 97%   Filed Weights   07/01/19 1518  Weight: 278 lb (126.1 kg)    Physical Exam Constitutional:      Appearance: Normal appearance. He is normal weight.  Cardiovascular:     Rate and Rhythm: Normal rate and regular rhythm.     Heart sounds: Normal heart sounds.  Pulmonary:     Effort: Pulmonary effort is normal.     Breath sounds: Normal breath sounds.  Musculoskeletal:        General: Normal range of motion.  Skin:    General: Skin is warm and dry.  Neurological:     Mental Status: He is alert and oriented to person, place, and time. Mental status is at baseline.  Psychiatric:        Mood and Affect: Mood normal.        Behavior: Behavior normal.        Thought Content: Thought content normal.        Judgment: Judgment normal.    Mild adenopathy palpable in the left neck region.  No splenomegaly.  LABORATORY DATA:  I have reviewed the labs as listed.  CBC    Component Value Date/Time   WBC 5.2 06/30/2019 1204   RBC 5.11 06/30/2019 1204   HGB 16.3 06/30/2019 1204   HCT 48.7  06/30/2019 1204   PLT 132 (L) 06/30/2019 1204   MCV 95.3 06/30/2019 1204   MCH 31.9 06/30/2019 1204   MCHC 33.5 06/30/2019 1204   RDW 12.1 06/30/2019 1204   LYMPHSABS 0.6 (L) 06/30/2019 1204   MONOABS 0.5 06/30/2019 1204   EOSABS 0.1 06/30/2019 1204   BASOSABS 0.0 06/30/2019 1204   CMP Latest Ref Rng & Units 06/30/2019 03/26/2019 12/15/2018  Glucose 70 - 99 mg/dL 121(H) 138(H) 82  BUN 8 - 23 mg/dL 18 18 21   Creatinine 0.61 - 1.24 mg/dL 1.03 0.92 1.03  Sodium 135 - 145 mmol/L 140 138 141  Potassium 3.5 - 5.1 mmol/L 3.6 3.5 3.8  Chloride 98 - 111 mmol/L 101 105 106  CO2 22 - 32 mmol/L 30 25 23   Calcium 8.9 - 10.3 mg/dL 9.1 8.5(L) 9.1  Total Protein 6.5 - 8.1 g/dL 6.7 6.4(L) 6.7  Total Bilirubin 0.3 - 1.2 mg/dL 1.5(H) 1.1 1.4(H)  Alkaline Phos 38 - 126 U/L 59 58 52  AST 15 - 41 U/L 28 25 27   ALT 0 - 44 U/L 30 26 30        DIAGNOSTIC IMAGING:  I have independently reviewed the scans and discussed with the patient.    ASSESSMENT & PLAN:   Lymphoma, small lymphocytic (HCC) 1.  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 -Ibrutinib 420 mg started on 12/01/2017, 6 cycles of rituximab from 01/01/2018 through 05/21/2018. -He is continuing to tolerate ibrutinib very well.  Denies any fevers, night sweats or weight loss.  No bleeding episodes. -Physical exam today did not reveal any splenomegaly.  Subcentimeter lymph nodes palpable in the neck region of stable. -He has a erythematous papular rash on the anterior abdominal wall.  He reports some itching.  I have told him to use moisturizing lotion. -We reviewed his blood work which is grossly within normal limits.  We reviewed CT CAP from 06/30/2019.  Interval decrease in size of the multiple bilateral iliac and pelvic  sidewall and inguinal lymph nodes.  Largest left inguinal lymph node measuring 1.7 x 1.3 cm.  Additional unchanged enlarged lower paraesophageal and  retroperitoneal lymph nodes.  Multiple stable likely benign small pulmonary nodules. -I will see him back in 4 months for follow-up.   2.  Mild thrombocytopenia: -Platelets are staying between 130 and 140.  No bleeding issues. -Likely myelosuppression from ibrutinib.  3.  Mild hyperbilirubinemia: -He had mildly elevated total bilirubin since 2017 which is stable.      Orders placed this encounter:  Orders Placed This Encounter  Procedures  . CBC with Differential/Platelet  . Comprehensive metabolic panel  . Lactate dehydrogenase      Derek Jack, MD [place] [phone]

## 2019-07-01 NOTE — Assessment & Plan Note (Addendum)
1.  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 -Ibrutinib 420 mg started on 12/01/2017, 6 cycles of rituximab from 01/01/2018 through 05/21/2018. -He is continuing to tolerate ibrutinib very well.  Denies any fevers, night sweats or weight loss.  No bleeding episodes. -Physical exam today did not reveal any splenomegaly.  Subcentimeter lymph nodes palpable in the neck region of stable. -He has a erythematous papular rash on the anterior abdominal wall.  He reports some itching.  I have told him to use moisturizing lotion. -We reviewed his blood work which is grossly within normal limits.  We reviewed CT CAP from 06/30/2019.  Interval decrease in size of the multiple bilateral iliac and pelvic sidewall and inguinal lymph nodes.  Largest left inguinal lymph node measuring 1.7 x 1.3 cm.  Additional unchanged enlarged lower paraesophageal and retroperitoneal lymph nodes.  Multiple stable likely benign small pulmonary nodules. -I will see him back in 4 months for follow-up.   2.  Mild thrombocytopenia: -Platelets are staying between 130 and 140.  No bleeding issues. -Likely myelosuppression from ibrutinib.  3.  Mild hyperbilirubinemia: -He had mildly elevated total bilirubin since 2017 which is stable.

## 2019-07-13 ENCOUNTER — Other Ambulatory Visit (HOSPITAL_COMMUNITY): Payer: Self-pay | Admitting: Hematology

## 2019-07-13 DIAGNOSIS — C8300 Small cell B-cell lymphoma, unspecified site: Secondary | ICD-10-CM

## 2019-07-19 ENCOUNTER — Telehealth (HOSPITAL_COMMUNITY): Payer: Self-pay | Admitting: Pharmacy Technician

## 2019-07-19 MED FILL — IMBRUVICA 420 MG TAB: 420 | 28 days supply | Qty: 28 | Fill #0

## 2019-07-19 NOTE — Telephone Encounter (Signed)
Oral Oncology Patient Advocate Encounter   Patients current copay card for Imbruvica required re-enrollment.  Enrolled patient in [place] card for [place].  This copay card will make the patients copay $10.00.  The billing information is as follows and has been shared with [facility].   RxBin: [id number] PCN: Loyalty Member ID: [id number] Group ID: [id number]      [place]enn[place] Nancy [place] Patient [place] Phone [phone] Fax [phone] 07/19/2019 8:55 AM

## 2019-08-10 ENCOUNTER — Encounter (HOSPITAL_COMMUNITY): Payer: Self-pay | Admitting: *Deleted

## 2019-08-10 NOTE — Progress Notes (Signed)
Patient called today stating that he is having a cyst removed from his head and he wants to check on his imbruvica.    Per Dr. Delton Coombes, okay for patient to stop taking Imbruvica prior to his procedure that is scheduled for Thursday.  He wants him to stop 5 days before but since it is scheduled Thursday, he just needs to hold starting with today's dose.  Patient verbalizes understanding.

## 2019-08-17 MED FILL — IMBRUVICA 420 MG TAB: 420 | 28 days supply | Qty: 28 | Fill #1

## 2019-09-10 MED FILL — IMBRUVICA 420 MG TAB: 420 | 28 days supply | Qty: 28 | Fill #2

## 2019-10-14 MED FILL — IMBRUVICA 420 MG TAB: 420 | 28 days supply | Qty: 28 | Fill #3

## 2019-10-28 ENCOUNTER — Inpatient Hospital Stay (HOSPITAL_COMMUNITY): Payer: Managed Care, Other (non HMO) | Attending: Hematology

## 2019-10-28 ENCOUNTER — Other Ambulatory Visit: Payer: Self-pay

## 2019-10-28 DIAGNOSIS — C8300 Small cell B-cell lymphoma, unspecified site: Secondary | ICD-10-CM

## 2019-10-28 DIAGNOSIS — C9110 Chronic lymphocytic leukemia of B-cell type not having achieved remission: Secondary | ICD-10-CM | POA: Diagnosis present

## 2019-10-28 DIAGNOSIS — D696 Thrombocytopenia, unspecified: Secondary | ICD-10-CM | POA: Diagnosis not present

## 2019-10-28 LAB — CBC WITH DIFFERENTIAL/PLATELET
Abs Immature Granulocytes: 0.07 10*3/uL (ref 0.00–0.07)
Basophils Absolute: 0.1 10*3/uL (ref 0.0–0.1)
Basophils Relative: 1 %
Eosinophils Absolute: 0.1 10*3/uL (ref 0.0–0.5)
Eosinophils Relative: 1 %
HCT: 46.8 % (ref 39.0–52.0)
Hemoglobin: 15.8 g/dL (ref 13.0–17.0)
Immature Granulocytes: 1 %
Lymphocytes Relative: 10 %
Lymphs Abs: 0.6 10*3/uL — ABNORMAL LOW (ref 0.7–4.0)
MCH: 31.9 pg (ref 26.0–34.0)
MCHC: 33.8 g/dL (ref 30.0–36.0)
MCV: 94.5 fL (ref 80.0–100.0)
Monocytes Absolute: 0.6 10*3/uL (ref 0.1–1.0)
Monocytes Relative: 9 %
Neutro Abs: 4.7 10*3/uL (ref 1.7–7.7)
Neutrophils Relative %: 78 %
Platelets: 129 10*3/uL — ABNORMAL LOW (ref 150–400)
RBC: 4.95 MIL/uL (ref 4.22–5.81)
RDW: 12.3 % (ref 11.5–15.5)
WBC: 6.1 10*3/uL (ref 4.0–10.5)
nRBC: 0 % (ref 0.0–0.2)

## 2019-10-28 LAB — COMPREHENSIVE METABOLIC PANEL
ALT: 30 U/L (ref 0–44)
AST: 27 U/L (ref 15–41)
Albumin: 4.2 g/dL (ref 3.5–5.0)
Alkaline Phosphatase: 49 U/L (ref 38–126)
Anion gap: 8 (ref 5–15)
BUN: 28 mg/dL — ABNORMAL HIGH (ref 8–23)
CO2: 26 mmol/L (ref 22–32)
Calcium: 9.1 mg/dL (ref 8.9–10.3)
Chloride: 105 mmol/L (ref 98–111)
Creatinine, Ser: 1.51 mg/dL — ABNORMAL HIGH (ref 0.61–1.24)
GFR calc Af Amer: 57 mL/min — ABNORMAL LOW (ref >60)
GFR calc non Af Amer: 49 mL/min — ABNORMAL LOW (ref >60)
Glucose, Bld: 71 mg/dL (ref 70–99)
Potassium: 3.7 mmol/L (ref 3.5–5.1)
Sodium: 139 mmol/L (ref 135–145)
Total Bilirubin: 1.4 mg/dL — ABNORMAL HIGH (ref 0.3–1.2)
Total Protein: 6.5 g/dL (ref 6.5–8.1)

## 2019-10-28 LAB — LACTATE DEHYDROGENASE: LDH: 178 U/L (ref 98–192)

## 2019-10-29 MED ORDER — PEGFILGRASTIM INJECTION 6 MG/0.6ML ~~LOC~~
PREFILLED_SYRINGE | SUBCUTANEOUS | Status: AC
  Filled 2019-10-29: qty 0.6

## 2019-11-04 ENCOUNTER — Other Ambulatory Visit: Payer: Self-pay

## 2019-11-04 ENCOUNTER — Inpatient Hospital Stay (HOSPITAL_COMMUNITY): Payer: Managed Care, Other (non HMO) | Admitting: Hematology

## 2019-11-04 ENCOUNTER — Encounter (HOSPITAL_COMMUNITY): Payer: Self-pay | Admitting: Hematology

## 2019-11-04 VITALS — BP 162/83 | HR 66 | Temp 96.8°F | Resp 18 | Wt 270.8 lb

## 2019-11-04 DIAGNOSIS — C8300 Small cell B-cell lymphoma, unspecified site: Secondary | ICD-10-CM

## 2019-11-04 DIAGNOSIS — C9110 Chronic lymphocytic leukemia of B-cell type not having achieved remission: Secondary | ICD-10-CM | POA: Diagnosis not present

## 2019-11-04 NOTE — Progress Notes (Signed)
Adam Reyes, [place] [address]   CLINIC:  Medical Oncology/Hematology  PCP:  Asencion Noble, [place] [address] [phone]   REASON FOR VISIT: Follow-up for chronic lymphocytic leukemia (CLL)  CURRENT THERAPY:Ibrutinib 420 mg daily   BRIEF ONCOLOGIC HISTORY:  Oncology History  Chronic lymphocytic leukemia (CLL), B-cell ([facility])  06/14/2015 Imaging   CT neck- Bulky adenopathy throughout the neck bilaterally. There also are enlarged parotid lymph nodes bilaterally. There is bilateral axillary adenopathy as well as mediastinal adenopathy. Findings are consistent with lymphoma. Biopsy recommended.   06/27/2015 Procedure   Left neck lymph node biopsy by Dr. Benjamine Mola   06/27/2015 Pathology Results   Diagnosis Lymph node for lymphoma, Left neck node for lymphoma work up - [facility].  LOW GRADE.   06/27/2015 Pathology Results   Tissue-Flow Cytometry - MONOCLONAL B CELL POPULATION IDENTIFIED. The phenotypic features are consistent with small lymphocytic lymphoma/chronic lymphocytic leukemia and correlate well with the morphology in the lymph node   Lymphoma, small lymphocytic ([facility])  10/14/2017 Initial Diagnosis   Lymphoma, small lymphocytic ([facility])   12/25/2017 -  Chemotherapy   The patient had riTUXimab (RITUXAN) 900 mg in sodium chloride 0.9 % 250 mL (2.6471 mg/mL) infusion, 375 mg/m2 = 900 mg, Intravenous,  Once, 6 of 6 cycles Dose modification: 500 mg/m2 (original dose 500 mg/m2, Cycle 2, Reason: Provider Judgment) Administration: 900 mg (01/01/2018), 1,300 mg (01/29/2018), 1,300 mg (02/26/2018), 1,300 mg (03/26/2018), 1,300 mg (04/23/2018)  for chemotherapy treatment.       INTERVAL HISTORY:  Adam Reyes 64 y.o. male seen for follow-up of CLL/SLL.  He is continuing to work full-time job.  Appetite and energy levels are 100%.  He is taking ibrutinib without fail.  Denies any fevers, night sweats or weight loss.   No new onset pains reported.  No trouble with urination.  REVIEW OF SYSTEMS:  Review of Systems  All other systems reviewed and are negative.    PAST MEDICAL/SURGICAL HISTORY:  Past Medical History:  Diagnosis Date  . 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 Enlarged lymph node    left neck  . STEMI (ST elevation myocardial infarction) ([facility]) 03/05/2017   hx/notes 03/05/2017 -likely aborted anterior STEMI with 50% bifurcation LAD-Diag1.  No PCI.  Preserved EF   Past Surgical History:  Procedure Laterality Date  . COLONOSCOPY N/A 10/11/2015   Procedure: COLONOSCOPY;  Surgeon: Rogene Houston, MD;  [facility];  Service: Endoscopy;  Laterality: N/A;  10/11/2015  . Graded Exercise Tolerance Test (GXT/ETT)  05/2017   10.7 METs (9: 25 min.  Reached 103% max predicted heart rate).  No EKG findings to suggest coronary ischemia.  Negative, low risk GXT  . LEFT HEART CATH AND CORONARY ANGIOGRAPHY N/A 03/05/2017   Procedure: LEFT HEART CATH AND CORONARY ANGIOGRAPHY;  Surgeon: Leonie Man, MD;  Location: [facility];  Service: Cardiovascular: pLAD-Diag1 50-55% (non-flow-limiiting).  EF ~50-55%.  although bifurcation lesion was presumed Culprit - no PCI (not flow limiting). - Med Rx.  [name] MASS BIOPSY Left 06/27/2015   Procedure: OPEN LEFT NECK BIOPSY ;  Surgeon: Leta Baptist, MD;  Location: [facility];  Service: ENT;  Laterality: Left;  . REFRACTIVE SURGERY Bilateral      SOCIAL HISTORY:  Social  History   Socioeconomic History  . Marital status: Married    Spouse name: Not on file  . Number of children: Not on file  . Years of education: Not on file  . Highest education level: Not on file  Occupational History  . Not on file  Tobacco Use  . Smoking status: Former Smoker    Years: 2.00    Types: Cigarettes    . Smokeless tobacco: Former Systems developer    Types: Chew, Snuff  . Tobacco comment: 03/06/2017 "quit smoking when I was young; quit chew/snuff in ~ 2008"  Substance and Sexual Activity  . Alcohol use: Yes    Comment: 03/06/2017 "maybe 6 pack beer/month"  . Drug use: No  . Sexual activity: Yes  Other Topics Concern  . Not on file  Social History Narrative  . Not on file   Social Determinants of Health   Financial Resource Strain:   . Difficulty of Paying Living Expenses:   Food Insecurity:   . Worried About Charity fundraiser in the Last Year:   . Arboriculturist in the Last Year:   Transportation Needs:   . Film/video editor (Medical):   [name] Lack of Transportation (Non-Medical):   Physical Activity:   . Days of Exercise per Week:   . Minutes of Exercise per Session:   Stress:   . Feeling of Stress :   Social Connections:   . Frequency of Communication with Friends and Family:   . Frequency of Social Gatherings with Friends and Family:   . Attends Religious Services:   . Active Member of Clubs or Organizations:   . Attends Archivist Meetings:   [name] Marital Status:   Intimate Partner Violence:   . Fear of Current or Ex-Partner:   . Emotionally Abused:   [name] Physically Abused:   . Sexually Abused:     FAMILY HISTORY:  Family History  Problem Relation Age of Onset  . Diabetes Mother   . Cancer Father   . Cancer Brother     CURRENT MEDICATIONS:  Outpatient Encounter Medications as of 11/04/2019  Medication Sig  . aspirin 81 MG chewable tablet Chew 1 tablet (81 mg total) by mouth every other day.  [name] atorvastatin (LIPITOR) 40 MG tablet Take 1 tablet (40 mg total) by mouth daily.  . metoprolol tartrate (LOPRESSOR) 25 MG tablet TAKE 1/2 TABLET(12.5 MG) BY MOUTH TWICE DAILY  . [DISCONTINUED] IMBRUVICA 420 MG TABS TAKE 1 TABLET (420 MG) BY MOUTH DAILY.  . naproxen sodium (ANAPROX) 550 MG tablet TK 1 T PO Q 12 H PRN  . nitroGLYCERIN (NITROSTAT) 0.4 MG SL tablet Place 1  tablet (0.4 mg total) under the tongue every 5 (five) minutes as needed for chest pain. (Patient not taking: Reported on 07/01/2019)  . [DISCONTINUED] diphenhydrAMINE (BENADRYL) 25 mg capsule Take 25 mg by mouth at bedtime.   No facility-administered encounter medications on file as of 11/04/2019.    ALLERGIES:  No Known Allergies   PHYSICAL EXAM:  ECOG Performance status: 1  Vitals:   11/04/19 1524  BP: (!) 162/83  Pulse: 66  Resp: 18  Temp: (!) 96.8 F (36 C)  SpO2: 94%   Filed Weights   11/04/19 1524  Weight: 270 lb 12.8 oz (122.8 kg)    Physical Exam Constitutional:      Appearance: Normal appearance. He is normal weight.  Cardiovascular:     Rate and Rhythm: Normal rate and regular rhythm.  Heart sounds: Normal heart sounds.  Pulmonary:     Effort: Pulmonary effort is normal.     Breath sounds: Normal breath sounds.  Musculoskeletal:        General: Normal range of motion.  Skin:    General: Skin is warm and dry.  Neurological:     Mental Status: He is alert and oriented to person, place, and time. Mental status is at baseline.  Psychiatric:        Mood and Affect: Mood normal.        Behavior: Behavior normal.        Thought Content: Thought content normal.        Judgment: Judgment normal.    No splenomegaly palpable.  LABORATORY DATA:  I have reviewed the labs as listed.  CBC    Component Value Date/Time   WBC 6.1 10/28/2019 1451   RBC 4.95 10/28/2019 1451   HGB 15.8 10/28/2019 1451   HCT 46.8 10/28/2019 1451   PLT 129 (L) 10/28/2019 1451   MCV 94.5 10/28/2019 1451   MCH 31.9 10/28/2019 1451   MCHC 33.8 10/28/2019 1451   RDW 12.3 10/28/2019 1451   LYMPHSABS 0.6 (L) 10/28/2019 1451   MONOABS 0.6 10/28/2019 1451   EOSABS 0.1 10/28/2019 1451   BASOSABS 0.1 10/28/2019 1451   CMP Latest Ref Rng & Units 10/28/2019 06/30/2019 03/26/2019  Glucose 70 - 99 mg/dL 71 121(H) 138(H)  BUN 8 - 23 mg/dL 28(H) 18 18  Creatinine 0.61 - 1.24 mg/dL 1.51(H)  1.03 0.92  Sodium 135 - 145 mmol/L 139 140 138  Potassium 3.5 - 5.1 mmol/L 3.7 3.6 3.5  Chloride 98 - 111 mmol/L 105 101 105  CO2 22 - 32 mmol/L '26 30 25  '$ Calcium 8.9 - 10.3 mg/dL 9.1 9.1 8.5(L)  Total Protein 6.5 - 8.1 g/dL 6.5 6.7 6.4(L)  Total Bilirubin 0.3 - 1.2 mg/dL 1.4(H) 1.5(H) 1.1  Alkaline Phos 38 - 126 U/L 49 59 58  AST 15 - 41 U/L '27 28 25  '$ ALT 0 - 44 U/L '30 30 26       '$ DIAGNOSTIC IMAGING:  I have independently reviewed scans.    ASSESSMENT & PLAN:   Lymphoma, small lymphocytic (HCC) 1.  Stage IV small lymphocytic lymphoma: -CLL FISH panel normal, I GVH positive, T p53 negative. -Ibrutinib for 20 mg started on 12/01/2017, 6 cycles of right external from 6 22019-11 01/2018. -He is continuing to tolerate ibrutinib very well. -I have reviewed his labs.  White count is 6.1.  Platelet count is 129.  Hemoglobin is normal.  LDH is 178. -CT CAP from 06/30/2019 showed interval decrease in size of multiple bilateral iliac and pelvic sidewall and inguinal lymph nodes.  Largest left inguinal lymph node measures 1.7 x 1.3 cm.  Additional unchanged enlarged lower paraesophageal and retroperitoneal lymph nodes present.  Multiple stable likely benign small pulmonary nodules. -I will see him back in 3 months with repeat labs.  2.  Mild thrombocytopenia: -Platelet count is staying between 130 and 140.  Today's platelet count is 129.  Likely myelosuppression from ibrutinib.  3.  Mild hyperbilirubinemia: -He has mildly elevated total bilirubin since 2017 which is stable.  Total bilirubin today is 1.4.  4.  Elevated creatinine: -His creatinine has gone up to 1.51 from 1.03 previously.  I have reviewed all his medications. -He is taking Naprosyn every 12 hours.  He was told to stop that. -Creatinine can also be elevated from Ibrutinib.  We will closely  watch it.  We will repeat it in 3 months.     Orders placed this encounter:  Orders Placed This Encounter  Procedures  . CBC with  Differential/Platelet  . Comprehensive metabolic panel  . Lactate dehydrogenase      Derek Jack, MD [place] [phone]

## 2019-11-04 NOTE — Patient Instructions (Addendum)
[facility] at [facility] Discharge Instructions  You were seen today by Dr. Katragadda. He went over your recent lab results. He will see you back in 3 months for labs and follow up.   Thank you for choosing  Can[facility] Center at [facility] to provide your oncology and hematology care.  To afford each patient quality time with our provider, please arrive at least 15 minutes before your scheduled appointment time.   If you have a lab appointment with the Cancer Center please come in thru the  Main Entrance and check in at the main information desk  You need to re-schedule your appointment should you arrive 10 or more minutes late.  We strive to give you quality time with our providers, and arriving late affects you and other patients whose appointments are after yours.  Also, if you no show three or more times for appointments you may be dismissed from the clinic at the providers discretion.     Again, thank you for choosing [facility].  Our hope is that these requests will decrease the amount of time that you wait before being seen by our physicians.       _____________________________________________________________  Should you have questions after your visit to  Cancer Center, please contact our office at [phone] between the hours of 8:00 a.m. and 4:30 p.m.  Voicemails left after 4:00 p.m. will not be returned until the following business day.  For prescription refill requests, have your pharmacy contact our office and allow 72 hours.    Cancer Center Support Programs:   > Cancer Support Group  2nd Tuesday of the month 1pm-2pm, Journey Room    

## 2019-11-08 ENCOUNTER — Other Ambulatory Visit (HOSPITAL_COMMUNITY): Payer: Self-pay | Admitting: Nurse Practitioner

## 2019-11-08 DIAGNOSIS — C8300 Small cell B-cell lymphoma, unspecified site: Secondary | ICD-10-CM

## 2019-11-11 MED FILL — IMBRUVICA 420 MG TAB: 420 | 28 days supply | Qty: 28 | Fill #0

## 2019-11-13 NOTE — Assessment & Plan Note (Signed)
1.  Stage IV small lymphocytic lymphoma: -CLL FISH panel normal, I GVH positive, T p53 negative. -Ibrutinib for 20 mg started on 12/01/2017, 6 cycles of right external from 6 22019-11 01/2018. -He is continuing to tolerate ibrutinib very well. -I have reviewed his labs.  White count is 6.1.  Platelet count is 129.  Hemoglobin is normal.  LDH is 178. -CT CAP from 06/30/2019 showed interval decrease in size of multiple bilateral iliac and pelvic sidewall and inguinal lymph nodes.  Largest left inguinal lymph node measures 1.7 x 1.3 cm.  Additional unchanged enlarged lower paraesophageal and retroperitoneal lymph nodes present.  Multiple stable likely benign small pulmonary nodules. -I will see him back in 3 months with repeat labs.  2.  Mild thrombocytopenia: -Platelet count is staying between 130 and 140.  Today's platelet count is 129.  Likely myelosuppression from ibrutinib.  3.  Mild hyperbilirubinemia: -He has mildly elevated total bilirubin since 2017 which is stable.  Total bilirubin today is 1.4.  4.  Elevated creatinine: -His creatinine has gone up to 1.51 from 1.03 previously.  I have reviewed all his medications. -He is taking Naprosyn every 12 hours.  He was told to stop that. -Creatinine can also be elevated from Ibrutinib.  We will closely watch it.  We will repeat it in 3 months.

## 2019-12-09 MED FILL — IMBRUVICA 420 MG TAB: 420 | 28 days supply | Qty: 28 | Fill #1

## 2020-01-06 MED FILL — IMBRUVICA 420 MG TAB: 420 | 28 days supply | Qty: 28 | Fill #2

## 2020-01-26 ENCOUNTER — Telehealth (HOSPITAL_COMMUNITY): Payer: Self-pay | Admitting: Pharmacy Technician

## 2020-01-26 NOTE — Telephone Encounter (Signed)
Oral Oncology Patient Advocate Encounter  Patients current prior authorization for Imbruvica expired on 01/19/20.   Called [employer] to initiate prior authorization at [phone].  Completed questions over the phone and Kate Sable has been authorized.  Approval is for 13 cycles from 01/26/20 to 01/25/21.  PA # [id number]  [place] Nancy Crooks Patient [place] Phone [phone] Fax [phone] 01/26/2020 11:55 AM

## 2020-02-01 ENCOUNTER — Other Ambulatory Visit: Payer: Self-pay

## 2020-02-01 ENCOUNTER — Inpatient Hospital Stay (HOSPITAL_COMMUNITY): Payer: Managed Care, Other (non HMO) | Attending: Hematology

## 2020-02-01 DIAGNOSIS — Z87891 Personal history of nicotine dependence: Secondary | ICD-10-CM | POA: Diagnosis not present

## 2020-02-01 DIAGNOSIS — D849 Immunodeficiency, unspecified: Secondary | ICD-10-CM | POA: Diagnosis not present

## 2020-02-01 DIAGNOSIS — C8300 Small cell B-cell lymphoma, unspecified site: Secondary | ICD-10-CM

## 2020-02-01 DIAGNOSIS — C9110 Chronic lymphocytic leukemia of B-cell type not having achieved remission: Secondary | ICD-10-CM | POA: Insufficient documentation

## 2020-02-01 DIAGNOSIS — D696 Thrombocytopenia, unspecified: Secondary | ICD-10-CM | POA: Diagnosis not present

## 2020-02-01 DIAGNOSIS — Z809 Family history of malignant neoplasm, unspecified: Secondary | ICD-10-CM | POA: Insufficient documentation

## 2020-02-01 DIAGNOSIS — Z833 Family history of diabetes mellitus: Secondary | ICD-10-CM | POA: Diagnosis not present

## 2020-02-01 DIAGNOSIS — S0121XA Laceration without foreign body of nose, initial encounter: Secondary | ICD-10-CM | POA: Insufficient documentation

## 2020-02-01 DIAGNOSIS — R7989 Other specified abnormal findings of blood chemistry: Secondary | ICD-10-CM | POA: Diagnosis not present

## 2020-02-01 DIAGNOSIS — I252 Old myocardial infarction: Secondary | ICD-10-CM | POA: Insufficient documentation

## 2020-02-01 DIAGNOSIS — Z79899 Other long term (current) drug therapy: Secondary | ICD-10-CM | POA: Insufficient documentation

## 2020-02-01 LAB — CBC WITH DIFFERENTIAL/PLATELET
Abs Immature Granulocytes: 0.06 10*3/uL (ref 0.00–0.07)
Basophils Absolute: 0 10*3/uL (ref 0.0–0.1)
Basophils Relative: 1 %
Eosinophils Absolute: 0.1 10*3/uL (ref 0.0–0.5)
Eosinophils Relative: 2 %
HCT: 45.9 % (ref 39.0–52.0)
Hemoglobin: 15.8 g/dL (ref 13.0–17.0)
Immature Granulocytes: 1 %
Lymphocytes Relative: 11 %
Lymphs Abs: 0.7 10*3/uL (ref 0.7–4.0)
MCH: 32 pg (ref 26.0–34.0)
MCHC: 34.4 g/dL (ref 30.0–36.0)
MCV: 93.1 fL (ref 80.0–100.0)
Monocytes Absolute: 0.6 10*3/uL (ref 0.1–1.0)
Monocytes Relative: 9 %
Neutro Abs: 5 10*3/uL (ref 1.7–7.7)
Neutrophils Relative %: 76 %
Platelets: 139 10*3/uL — ABNORMAL LOW (ref 150–400)
RBC: 4.93 MIL/uL (ref 4.22–5.81)
RDW: 12.4 % (ref 11.5–15.5)
WBC: 6.4 10*3/uL (ref 4.0–10.5)
nRBC: 0 % (ref 0.0–0.2)

## 2020-02-01 LAB — COMPREHENSIVE METABOLIC PANEL
ALT: 29 U/L (ref 0–44)
AST: 26 U/L (ref 15–41)
Albumin: 4.3 g/dL (ref 3.5–5.0)
Alkaline Phosphatase: 58 U/L (ref 38–126)
Anion gap: 10 (ref 5–15)
BUN: 23 mg/dL (ref 8–23)
CO2: 24 mmol/L (ref 22–32)
Calcium: 8.8 mg/dL — ABNORMAL LOW (ref 8.9–10.3)
Chloride: 103 mmol/L (ref 98–111)
Creatinine, Ser: 1.06 mg/dL (ref 0.61–1.24)
GFR calc Af Amer: >60 mL/min (ref >60)
GFR calc non Af Amer: >60 mL/min (ref >60)
Glucose, Bld: 105 mg/dL — ABNORMAL HIGH (ref 70–99)
Potassium: 3.5 mmol/L (ref 3.5–5.1)
Sodium: 137 mmol/L (ref 135–145)
Total Bilirubin: 1.3 mg/dL — ABNORMAL HIGH (ref 0.3–1.2)
Total Protein: 6.7 g/dL (ref 6.5–8.1)

## 2020-02-01 LAB — LACTATE DEHYDROGENASE: LDH: 164 U/L (ref 98–192)

## 2020-02-03 MED FILL — IMBRUVICA 420 MG TAB: 420 | 28 days supply | Qty: 28 | Fill #3

## 2020-02-08 ENCOUNTER — Other Ambulatory Visit: Payer: Self-pay

## 2020-02-08 ENCOUNTER — Inpatient Hospital Stay (HOSPITAL_COMMUNITY): Payer: Managed Care, Other (non HMO) | Admitting: Hematology

## 2020-02-08 VITALS — BP 151/89 | HR 73 | Temp 97.5°F | Resp 18 | Wt 267.1 lb

## 2020-02-08 DIAGNOSIS — C8300 Small cell B-cell lymphoma, unspecified site: Secondary | ICD-10-CM | POA: Diagnosis not present

## 2020-02-08 DIAGNOSIS — C9110 Chronic lymphocytic leukemia of B-cell type not having achieved remission: Secondary | ICD-10-CM | POA: Diagnosis not present

## 2020-02-08 NOTE — Patient Instructions (Signed)
[facility] at [facility] Discharge Instructions  You were seen today by Dr. Katragadda. He went over your recent results. Dr. Katragadda will see you back in for labs and follow up.   Thank you for choosing [facility] at [facility] to provide your oncology and hematology care.  To afford each patient quality time with our provider, please arrive at least 15 minutes before your scheduled appointment time.   If you have a lab appointment with the Cancer Center please come in thru the Main Entrance and check in at the main information desk  You need to re-schedule your appointment should you arrive 10 or more minutes late.  We strive to give you quality time with our providers, and arriving late affects you and other patients whose appointments are after yours.  Also, if you no show three or more times for appointments you may be dismissed from the clinic at the providers discretion.     Again, thank you for choosing [facility].  Our hope is that these requests will decrease the amount of time that you wait before being seen by our physicians.       _____________________________________________________________  Should you have questions after your visit to [facility], please contact our office at [phone] between the hours of 8:00 a.m. and 4:30 p.m.  Voicemails left after 4:00 p.m. will not be returned until the following business day.  For prescription refill requests, have your pharmacy contact our office and allow 72 hours.    Cancer Center Support Programs:   > Cancer Support Group  2nd Tuesday of the month 1pm-2pm, Journey Room    

## 2020-02-08 NOTE — Progress Notes (Signed)
[place] [address] [place], [place] [address]   CLINIC:  Medical Oncology/Hematology  PCP:  Asencion Noble, MD [address] / [place] [address] [phone]   REASON FOR VISIT:  Follow-up for chronic lymphocytic leukemia (CLL)  PRIOR THERAPY: Ibrutinib and rituximab.  NGS Results: Not applicable.  CURRENT THERAPY: Ibrutinib 420 mg daily  BRIEF ONCOLOGIC HISTORY:  Oncology History  Chronic lymphocytic leukemia (CLL), B-cell ([place])  06/14/2015 Imaging   CT neck- Bulky adenopathy throughout the neck bilaterally. There also are enlarged parotid lymph nodes bilaterally. There is bilateral axillary adenopathy as well as mediastinal adenopathy. Findings are consistent with lymphoma. Biopsy recommended.   06/27/2015 Procedure   Left neck lymph node biopsy by Dr. Benjamine Mola   06/27/2015 Pathology Results   Diagnosis Lymph node for lymphoma, Left neck node for lymphoma work up - [place].  LOW GRADE.   06/27/2015 Pathology Results   Tissue-Flow Cytometry - MONOCLONAL B CELL POPULATION IDENTIFIED. The phenotypic features are consistent with small lymphocytic lymphoma/chronic lymphocytic leukemia and correlate well with the morphology in the lymph node   Lymphoma, small lymphocytic ([place])  10/14/2017 Initial Diagnosis   Lymphoma, small lymphocytic ([place])   01/01/2018 - 05/21/2018 Chemotherapy   The patient had riTUXimab (RITUXAN) 900 mg in sodium chloride 0.9 % 250 mL (2.6471 mg/mL) infusion, 375 mg/m2 = 900 mg, Intravenous,  Once, 6 of 6 cycles Dose modification: 500 mg/m2 (original dose 500 mg/m2, Cycle 2, Reason: Provider Judgment) Administration: 900 mg (01/01/2018), 1,300 mg (01/29/2018), 1,300 mg (02/26/2018), 1,300 mg (03/26/2018), 1,300 mg (04/23/2018), 1,300 mg (05/21/2018)  for chemotherapy treatment.      CANCER STAGING: Cancer Staging No matching staging information was found for the patient.  INTERVAL HISTORY:  [name],  a 64 y.o. male, returns for routine follow-up of his chronic lymphocytic leukemia (CLL). Ethaniel was last seen on 11/04/2019.   He is not vaccinated against COVID-19.   He has a laceration across the bridge of his nose. He notes that he accidentally cut it while putting away chairs while at church.  Denies any fevers, night sweats or weight loss.  Denies any infections.  No bleeding episodes reported.   REVIEW OF SYSTEMS:  Review of Systems  Constitutional: Negative.   HENT:  Negative.   Eyes: Negative.   Respiratory: Negative.   Cardiovascular: Negative.   Gastrointestinal: Negative.   Endocrine: Negative.   Genitourinary: Negative.    Musculoskeletal: Negative.   Skin: Negative.   Neurological: Negative.   Hematological: Negative.   Psychiatric/Behavioral: Negative.   All other systems reviewed and are negative.   PAST MEDICAL/SURGICAL HISTORY:  Past Medical History:  Diagnosis Date  . 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 Enlarged lymph node    left neck  . STEMI (ST elevation myocardial infarction) ([place]) 03/05/2017   hx/notes 03/05/2017 -likely aborted anterior STEMI with 50% bifurcation LAD-Diag1.  No PCI.  Preserved EF   Past Surgical History:  Procedure Laterality Date  . COLONOSCOPY N/A 10/11/2015   Procedure: COLONOSCOPY;  Surgeon: Rogene Houston, MD;  [facility];  Service: Endoscopy;  Laterality: N/A;  10/11/2015  . Graded Exercise Tolerance Test (GXT/ETT)  05/2017   10.7 METs (9: 25 min.  Reached 103% max predicted heart rate).  No EKG findings to suggest coronary  ischemia.  Negative, low risk GXT  . LEFT HEART CATH AND CORONARY ANGIOGRAPHY N/A 03/05/2017   Procedure: LEFT HEART CATH AND CORONARY ANGIOGRAPHY;  Surgeon: Leonie Man, MD;  Location: [facility];  Service: Cardiovascular:  pLAD-Diag1 50-55% (non-flow-limiiting).  EF ~50-55%.  although bifurcation lesion was presumed Culprit - no PCI (not flow limiting). - Med Rx.  [name] MASS BIOPSY Left 06/27/2015   Procedure: OPEN LEFT NECK BIOPSY ;  Surgeon: Leta Baptist, MD;  Location: [facility];  Service: ENT;  Laterality: Left;  . REFRACTIVE SURGERY Bilateral     SOCIAL HISTORY:  Social History   Socioeconomic History  . Marital status: Married    Spouse name: Not on file  . Number of children: Not on file  . Years of education: Not on file  . Highest education level: Not on file  Occupational History  . Not on file  Tobacco Use  . Smoking status: Former Smoker    Years: 2.00    Types: Cigarettes  . Smokeless tobacco: Former Systems developer    Types: Chew, Snuff  . Tobacco comment: 03/06/2017 "quit smoking when I was young; quit chew/snuff in ~ 2008"  Vaping Use  . Vaping Use: Never used  Substance and Sexual Activity  . Alcohol use: Yes    Comment: 03/06/2017 "maybe 6 pack beer/month"  . Drug use: No  . Sexual activity: Yes  Other Topics Concern  . Not on file  Social History Narrative  . Not on file   Social Determinants of Health   Financial Resource Strain:   . Difficulty of Paying Living Expenses:   Food Insecurity:   . Worried About Charity fundraiser in the Last Year:   . Arboriculturist in the Last Year:   Transportation Needs:   . Film/video editor (Medical):   [name] Lack of Transportation (Non-Medical):   Physical Activity:   . Days of Exercise per Week:   . Minutes of Exercise per Session:   Stress:   . Feeling of Stress :   Social Connections:   . Frequency of Communication with Friends and Family:   . Frequency of Social Gatherings with Friends and Family:   . Attends Religious Services:   . Active Member of Clubs or Organizations:   . Attends Archivist Meetings:   [name] Marital Status:   Intimate Partner Violence:   . Fear of Current or Ex-Partner:   . Emotionally  Abused:   [name] Physically Abused:   . Sexually Abused:     FAMILY HISTORY:  Family History  Problem Relation Age of Onset  . Diabetes Mother   . Cancer Father   . Cancer Brother     CURRENT MEDICATIONS:  Current Outpatient Medications  Medication Sig Dispense Refill  . aspirin 81 MG chewable tablet Chew 1 tablet (81 mg total) by mouth every other day. 30 tablet 11  . IMBRUVICA 420 MG TABS TAKE 1 TABLET (420 MG) BY MOUTH DAILY. 28 tablet 3  . metoprolol tartrate (LOPRESSOR) 25 MG tablet TAKE 1/2 TABLET(12.5 MG) BY MOUTH TWICE DAILY 90 tablet 3  . naproxen sodium (ANAPROX) 550 MG tablet TK 1 T PO Q 12 H PRN  0  . atorvastatin (LIPITOR) 40 MG tablet Take 1 tablet (40 mg total) by mouth daily. 90 tablet 3  . nitroGLYCERIN (NITROSTAT) 0.4 MG SL tablet Place 1 tablet (0.4 mg total) under the tongue every 5 (five) minutes as needed for chest  pain. (Patient not taking: Reported on 07/01/2019) 25 tablet 6   No current facility-administered medications for this visit.    ALLERGIES:  No Known Allergies  PHYSICAL EXAM:  Performance status (ECOG): 0 - Asymptomatic   Vitals:   02/08/20 1528  BP: (!) 151/89  Pulse: 73  Resp: 18  Temp: (!) 97.5 F (36.4 C)  SpO2: 94%   Wt Readings from Last 3 Encounters:  02/08/20 (!) 267 lb 1.6 oz (121.2 kg)  11/04/19 270 lb 12.8 oz (122.8 kg)  07/01/19 278 lb (126.1 kg)   Physical Exam Vitals reviewed.  Constitutional:      Appearance: Normal appearance.  HENT:     Mouth/Throat:     Mouth: Mucous membranes are moist.  Eyes:     Pupils: Pupils are equal, round, and reactive to light.  Cardiovascular:     Rate and Rhythm: Normal rate and regular rhythm.     Pulses: Normal pulses.     Heart sounds: Normal heart sounds.  Pulmonary:     Effort: Pulmonary effort is normal.     Breath sounds: Normal breath sounds.  Abdominal:     Palpations: Abdomen is soft. There is no mass.     Tenderness: There is no abdominal tenderness.  Musculoskeletal:      Right lower leg: No edema.     Left lower leg: No edema.  Skin:    Findings: Signs of injury (recent cut across bridge of nose) present.  Neurological:     Mental Status: He is alert and oriented to person, place, and time.  Psychiatric:        Mood and Affect: Mood normal.        Behavior: Behavior normal.      LABORATORY DATA:  I have reviewed the labs as listed.  CBC Latest Ref Rng & Units 02/01/2020 10/28/2019 06/30/2019  WBC 4.0 - 10.5 K/uL 6.4 6.1 5.2  Hemoglobin 13.0 - 17.0 g/dL 15.8 15.8 16.3  Hematocrit 39 - 52 % 45.9 46.8 48.7  Platelets 150 - 400 K/uL 139(L) 129(L) 132(L)   CMP Latest Ref Rng & Units 02/01/2020 10/28/2019 06/30/2019  Glucose 70 - 99 mg/dL 105(H) 71 121(H)  BUN 8 - 23 mg/dL 23 28(H) 18  Creatinine 0.61 - 1.24 mg/dL 1.06 1.51(H) 1.03  Sodium 135 - 145 mmol/L 137 139 140  Potassium 3.5 - 5.1 mmol/L 3.5 3.7 3.6  Chloride 98 - 111 mmol/L 103 105 101  CO2 22 - 32 mmol/L '24 26 30  '$ Calcium 8.9 - 10.3 mg/dL 8.8(L) 9.1 9.1  Total Protein 6.5 - 8.1 g/dL 6.7 6.5 6.7  Total Bilirubin 0.3 - 1.2 mg/dL 1.3(H) 1.4(H) 1.5(H)  Alkaline Phos 38 - 126 U/L 58 49 59  AST 15 - 41 U/L '26 27 28  '$ ALT 0 - 44 U/L '29 30 30    '$ DIAGNOSTIC IMAGING:  I have independently reviewed the scans and discussed with the patient. No results found.   ASSESSMENT:  1.  Stage IV small lymphocytic lymphoma: -CLL FISH panel normal, Ig HV positive, T p53 negative. -Ibrutinib 420 mg started on 12/01/2017, 6 cycles of rituximab from 12/14/2017 through 05/21/2018. -CT CAP on 06/30/2019 showed interval decrease in size of multiple bilateral iliac and pelvic sidewall and inguinal lymph nodes.  Largest left inguinal lymph node measures 1.7 x 1.3 cm.  Additional unchanged enlarged lower paraesophageal and retroperitoneal lymph nodes present.  Multiple stable likely benign small pulmonary nodules.   PLAN:  1.  Stage IV  small lymphocytic lymphoma: -He is continuing to tolerate ibrutinib full dose  very well.  No major bleeding episodes reported.  Denies any B symptoms.  He is continue to work full-time job. -Labs from 02/01/2020.  White count, hemoglobin are normal.  LFTs were mostly normal except elevated total bilirubin of 1.3.  LDH was normal. -I have recommended follow-up in 3 months with repeat labs.  I will also repeat CT chest, abdomen and pelvis with contrast prior to next visit. -If there is continued response, will consider spreading out his follow-up visits. -I have also extensively counseled him to get COVID-19 vaccine.  He will benefit from it because of his immunocompromised state.  2.  Mild thrombocytopenia: -Platelet count ranging between 120-140.  Likely from myelosuppression from the ibrutinib.  3.  Mild hyperbilirubinemia: -He has mildly elevated total bilirubin since 2017 which is stable.  Bilirubin today is 1.3.  4.  Elevated creatinine: -Creatinine at last visit was elevated at 1.51.  We have told him to not to take Naprosyn.  Creatinine today improved to 1.06.  He was recommended to drink plenty of fluids.    Orders placed this encounter:  No orders of the defined types were placed in this encounter.    Derek Jack, MD [facility] [phone]   I, Jacqualyn Posey, am acting as a scribe for Dr. Sanda Linger.  I, Derek Jack MD, have reviewed the above documentation for accuracy and completeness, and I agree with the above.

## 2020-02-28 ENCOUNTER — Other Ambulatory Visit (HOSPITAL_COMMUNITY): Payer: Self-pay | Admitting: Nurse Practitioner

## 2020-02-28 DIAGNOSIS — C8300 Small cell B-cell lymphoma, unspecified site: Secondary | ICD-10-CM

## 2020-02-29 ENCOUNTER — Other Ambulatory Visit (HOSPITAL_COMMUNITY): Payer: Self-pay | Admitting: Nurse Practitioner

## 2020-03-02 MED FILL — IMBRUVICA 420 MG TAB: 420 | 28 days supply | Qty: 28 | Fill #0

## 2020-03-30 MED FILL — IMBRUVICA 420 MG TAB: 420 | 28 days supply | Qty: 28 | Fill #0

## 2020-04-27 MED FILL — IMBRUVICA 420 MG TAB: 420 | 28 days supply | Qty: 28 | Fill #0

## 2020-05-11 ENCOUNTER — Inpatient Hospital Stay (HOSPITAL_COMMUNITY): Payer: Managed Care, Other (non HMO)

## 2020-05-11 ENCOUNTER — Ambulatory Visit (HOSPITAL_COMMUNITY)
Admission: RE | Admit: 2020-05-11 | Discharge: 2020-05-11 | Disposition: A | Discharge status: Home / Self Care | Payer: Managed Care, Other (non HMO) | Source: Ambulatory Visit | Attending: Hematology | Admitting: Hematology

## 2020-05-11 ENCOUNTER — Encounter (HOSPITAL_COMMUNITY): Payer: Self-pay

## 2020-05-11 ENCOUNTER — Other Ambulatory Visit: Payer: Self-pay

## 2020-05-11 DIAGNOSIS — C8300 Small cell B-cell lymphoma, unspecified site: Secondary | ICD-10-CM | POA: Insufficient documentation

## 2020-05-11 DIAGNOSIS — C9110 Chronic lymphocytic leukemia of B-cell type not having achieved remission: Secondary | ICD-10-CM | POA: Insufficient documentation

## 2020-05-11 LAB — CBC WITH DIFFERENTIAL/PLATELET
Abs Immature Granulocytes: 0.07 10*3/uL (ref 0.00–0.07)
Basophils Absolute: 0 10*3/uL (ref 0.0–0.1)
Basophils Relative: 0 %
Eosinophils Absolute: 0 10*3/uL (ref 0.0–0.5)
Eosinophils Relative: 0 %
HCT: 46.4 % (ref 39.0–52.0)
Hemoglobin: 15.5 g/dL (ref 13.0–17.0)
Immature Granulocytes: 1 %
Lymphocytes Relative: 3 %
Lymphs Abs: 0.3 10*3/uL — ABNORMAL LOW (ref 0.7–4.0)
MCH: 32 pg (ref 26.0–34.0)
MCHC: 33.4 g/dL (ref 30.0–36.0)
MCV: 95.7 fL (ref 80.0–100.0)
Monocytes Absolute: 0.7 10*3/uL (ref 0.1–1.0)
Monocytes Relative: 7 %
Neutro Abs: 9.3 10*3/uL — ABNORMAL HIGH (ref 1.7–7.7)
Neutrophils Relative %: 89 %
Platelets: 109 10*3/uL — ABNORMAL LOW (ref 150–400)
RBC: 4.85 MIL/uL (ref 4.22–5.81)
RDW: 12.7 % (ref 11.5–15.5)
WBC: 10.4 10*3/uL (ref 4.0–10.5)
nRBC: 0 % (ref 0.0–0.2)

## 2020-05-11 LAB — MAGNESIUM: Magnesium: 2.1 mg/dL (ref 1.7–2.4)

## 2020-05-11 LAB — COMPREHENSIVE METABOLIC PANEL
ALT: 27 U/L (ref 0–44)
AST: 26 U/L (ref 15–41)
Albumin: 4.2 g/dL (ref 3.5–5.0)
Alkaline Phosphatase: 46 U/L (ref 38–126)
Anion gap: 8 (ref 5–15)
BUN: 25 mg/dL — ABNORMAL HIGH (ref 8–23)
CO2: 27 mmol/L (ref 22–32)
Calcium: 8.9 mg/dL (ref 8.9–10.3)
Chloride: 102 mmol/L (ref 98–111)
Creatinine, Ser: 1.06 mg/dL (ref 0.61–1.24)
GFR, Estimated: >60 mL/min (ref >60)
Glucose, Bld: 120 mg/dL — ABNORMAL HIGH (ref 70–99)
Potassium: 3.7 mmol/L (ref 3.5–5.1)
Sodium: 137 mmol/L (ref 135–145)
Total Bilirubin: 2 mg/dL — ABNORMAL HIGH (ref 0.3–1.2)
Total Protein: 6.7 g/dL (ref 6.5–8.1)

## 2020-05-11 LAB — LACTATE DEHYDROGENASE: LDH: 149 U/L (ref 98–192)

## 2020-05-11 MED ORDER — IOHEXOL 300 MG/ML  SOLN
100.0000 mL | Freq: Once | INTRAMUSCULAR | Status: AC | PRN
  Administered 2020-05-11: 100 mL via INTRAVENOUS

## 2020-05-16 ENCOUNTER — Other Ambulatory Visit: Payer: Self-pay

## 2020-05-16 ENCOUNTER — Inpatient Hospital Stay (HOSPITAL_COMMUNITY): Payer: Managed Care, Other (non HMO) | Attending: Hematology | Admitting: Hematology

## 2020-05-16 ENCOUNTER — Encounter (HOSPITAL_COMMUNITY): Payer: Self-pay | Admitting: Hematology

## 2020-05-16 VITALS — BP 139/76 | HR 72 | Temp 97.0°F | Resp 18 | Wt 263.2 lb

## 2020-05-16 DIAGNOSIS — C9110 Chronic lymphocytic leukemia of B-cell type not having achieved remission: Secondary | ICD-10-CM | POA: Insufficient documentation

## 2020-05-16 DIAGNOSIS — R7989 Other specified abnormal findings of blood chemistry: Secondary | ICD-10-CM | POA: Diagnosis not present

## 2020-05-16 DIAGNOSIS — D696 Thrombocytopenia, unspecified: Secondary | ICD-10-CM | POA: Insufficient documentation

## 2020-05-16 DIAGNOSIS — R161 Splenomegaly, not elsewhere classified: Secondary | ICD-10-CM | POA: Diagnosis not present

## 2020-05-16 DIAGNOSIS — C8300 Small cell B-cell lymphoma, unspecified site: Secondary | ICD-10-CM

## 2020-05-16 NOTE — Patient Instructions (Signed)
[facility] at [facility] Discharge Instructions  You were seen today by Dr. Katragadda. He went over your recent results and scans. Dr. Katragadda will see you back in 4 months for labs and follow up.   Thank you for choosing [facility] at [facility] to provide your oncology and hematology care.  To afford each patient quality time with our provider, please arrive at least 15 minutes before your scheduled appointment time.   If you have a lab appointment with the Cancer Center please come in thru the Main Entrance and check in at the main information desk  You need to re-schedule your appointment should you arrive 10 or more minutes late.  We strive to give you quality time with our providers, and arriving late affects you and other patients whose appointments are after yours.  Also, if you no show three or more times for appointments you may be dismissed from the clinic at the providers discretion.     Again, thank you for choosing [facility].  Our hope is that these requests will decrease the amount of time that you wait before being seen by our physicians.       _____________________________________________________________  Should you have questions after your visit to [facility], please contact our office at [phone] between the hours of 8:00 a.m. and 4:30 p.m.  Voicemails left after 4:00 p.m. will not be returned until the following business day.  For prescription refill requests, have your pharmacy contact our office and allow 72 hours.    Cancer Center Support Programs:   > Cancer Support Group  2nd Tuesday of the month 1pm-2pm, Journey Room   

## 2020-05-16 NOTE — Progress Notes (Signed)
Adam Reyes, [place] [address]   CLINIC:  Medical Oncology/Hematology  PCP:  Asencion Noble, MD [address] / [place] [address]  [phone]  REASON FOR VISIT:  Follow-up for small lymphocytic lymphoma  PRIOR THERAPY: Rituximab monthly from 01/01/2018 to 05/21/2018  CURRENT THERAPY: Ibrutinib 420 mg daily  INTERVAL HISTORY:  [name], a 64 y.o. male, returns for routine follow-up for his small lymphocytic lymphoma. Heath was last seen on 02/08/2020.  Today he is accompanied by his wife. He reports feeling well, except for having multiple episodes of diarrhea for the past 5 days after getting his CT scan. He is taking Imodium for the diarrhea. He is taking ibrutinib daily and tolerating it well.   REVIEW OF SYSTEMS:  Review of Systems  Constitutional: Positive for appetite change (30%) and fatigue (90%).  Gastrointestinal: Positive for diarrhea (x5 days after CT scan).  All other systems reviewed and are negative.   PAST MEDICAL/SURGICAL HISTORY:  Past Medical History:  Diagnosis Date  . 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 Enlarged lymph node    left neck  . STEMI (ST elevation myocardial infarction) ([place]) 03/05/2017   hx/notes 03/05/2017 -likely aborted anterior STEMI with 50% bifurcation LAD-Diag1.  No PCI.  Preserved EF   Past Surgical History:  Procedure Laterality Date  . COLONOSCOPY N/A 10/11/2015   Procedure: COLONOSCOPY;  Surgeon: Rogene Houston, MD;  Location: [facility];  Service: Endoscopy;  Laterality: N/A;  10/11/2015  . Graded Exercise Tolerance Test (GXT/ETT)  05/2017   10.7 METs (9: 25 min.  Reached 103% max predicted heart rate).  No EKG findings to suggest coronary ischemia.  Negative, low risk GXT  . LEFT HEART CATH AND CORONARY  ANGIOGRAPHY N/A 03/05/2017   Procedure: LEFT HEART CATH AND CORONARY ANGIOGRAPHY;  Surgeon: Leonie Man, MD;  Location: [facility];  Service: Cardiovascular: pLAD-Diag1 50-55% (non-flow-limiiting).  EF ~50-55%.  although bifurcation lesion was presumed Culprit - no PCI (not flow limiting). - Med Rx.  [name] MASS BIOPSY Left 06/27/2015   Procedure: OPEN LEFT NECK BIOPSY ;  Surgeon: Leta Baptist, MD;  Location: [facility];  Service: ENT;  Laterality: Left;  . REFRACTIVE SURGERY Bilateral     SOCIAL HISTORY:  Social History   Socioeconomic History  . Marital status: Married    Spouse name: Not on file  . Number of children: Not on file  . Years of education: Not on file  . Highest education level: Not on file  Occupational History  . Not on file  Tobacco Use  . Smoking status: Former Smoker    Years: 2.00    Types: Cigarettes  . Smokeless tobacco: Former Systems developer    Types: Chew, Snuff  . Tobacco comment: 03/06/2017 "quit smoking when I was young; quit chew/snuff in ~ 2008"  Vaping Use  . Vaping Use: Never used  Substance and Sexual Activity  . Alcohol use: Yes    Comment: 03/06/2017 "maybe 6 pack beer/month"  . Drug use: No  . Sexual activity: Yes  Other Topics Concern  . Not on file  Social History Narrative  . Not on file   Social Determinants of Health   Financial Resource Strain: Low Risk   . Difficulty of Paying Living Expenses:  Not hard at all  Food Insecurity: No Food Insecurity  . Worried About Charity fundraiser in the Last Year: Never true  . Ran Out of Food in the Last Year: Never true  Transportation Needs: No Transportation Needs  . Lack of Transportation (Medical): No  . Lack of Transportation (Non-Medical): No  Physical Activity: Sufficiently Active  . Days of Exercise per Week: 5 days  . Minutes of Exercise per Session: 30 min  Stress: No Stress Concern Present  . Feeling of Stress : Not at all  Social Connections: Moderately Integrated  .  Frequency of Communication with Friends and Family: More than three times a week  . Frequency of Social Gatherings with Friends and Family: More than three times a week  . Attends Religious Services: More than 4 times per year  . Active Member of Clubs or Organizations: No  . Attends Archivist Meetings: Never  . Marital Status: Married  Human resources officer Violence: Not At Risk  . Fear of Current or Ex-Partner: No  . Emotionally Abused: No  . Physically Abused: No  . Sexually Abused: No    FAMILY HISTORY:  Family History  Problem Relation Age of Onset  . Diabetes Mother   . Cancer Father   . Cancer Brother     CURRENT MEDICATIONS:  Current Outpatient Medications  Medication Sig Dispense Refill  . aspirin 81 MG chewable tablet Chew 1 tablet (81 mg total) by mouth every other day. 30 tablet 11  . IMBRUVICA 420 MG TABS TAKE 1 TABLET (420 MG) BY MOUTH DAILY. 28 tablet 3  . metoprolol tartrate (LOPRESSOR) 25 MG tablet TAKE 1/2 TABLET(12.5 MG) BY MOUTH TWICE DAILY 90 tablet 3  . naproxen sodium (ANAPROX) 550 MG tablet TK 1 T PO Q 12 H PRN  0  . atorvastatin (LIPITOR) 40 MG tablet Take 1 tablet (40 mg total) by mouth daily. 90 tablet 3  . nitroGLYCERIN (NITROSTAT) 0.4 MG SL tablet Place 1 tablet (0.4 mg total) under the tongue every 5 (five) minutes as needed for chest pain. (Patient not taking: Reported on 07/01/2019) 25 tablet 6   No current facility-administered medications for this visit.    ALLERGIES:  No Known Allergies  PHYSICAL EXAM:  Performance status (ECOG): 0 - Asymptomatic  Vitals:   05/16/20 1607  BP: 139/76  Pulse: 72  Resp: 18  Temp: (!) 97 F (36.1 C)  SpO2: 97%   Wt Readings from Last 3 Encounters:  05/16/20 263 lb 3.2 oz (119.4 kg)  02/08/20 (!) 267 lb 1.6 oz (121.2 kg)  11/04/19 270 lb 12.8 oz (122.8 kg)   Physical Exam Vitals reviewed.  Constitutional:      Appearance: Normal appearance. He is obese.  Cardiovascular:     Rate and  Rhythm: Normal rate and regular rhythm.     Pulses: Normal pulses.     Heart sounds: Normal heart sounds.  Pulmonary:     Effort: Pulmonary effort is normal.     Breath sounds: Normal breath sounds.  Abdominal:     Palpations: Abdomen is soft. There is no hepatomegaly, splenomegaly or mass.     Tenderness: There is no abdominal tenderness.     Hernia: No hernia is present.  Musculoskeletal:     Right lower leg: No edema.     Left lower leg: No edema.  Lymphadenopathy:     Cervical: No cervical adenopathy.     Upper Body:     Right upper body:  No supraclavicular adenopathy.     Left upper body: No supraclavicular adenopathy.     Lower Body: No right inguinal adenopathy. No left inguinal adenopathy.  Neurological:     General: No focal deficit present.     Mental Status: He is alert and oriented to person, place, and time.  Psychiatric:        Mood and Affect: Mood normal.        Behavior: Behavior normal.     LABORATORY DATA:  I have reviewed the labs as listed.  CBC Latest Ref Rng & Units 05/11/2020 02/01/2020 10/28/2019  WBC 4.0 - 10.5 K/uL 10.4 6.4 6.1  Hemoglobin 13.0 - 17.0 g/dL 15.5 15.8 15.8  Hematocrit 39 - 52 % 46.4 45.9 46.8  Platelets 150 - 400 K/uL 109(L) 139(L) 129(L)   CMP Latest Ref Rng & Units 05/11/2020 02/01/2020 10/28/2019  Glucose 70 - 99 mg/dL 120(H) 105(H) 71  BUN 8 - 23 mg/dL 25(H) 23 28(H)  Creatinine 0.61 - 1.24 mg/dL 1.06 1.06 1.51(H)  Sodium 135 - 145 mmol/L 137 137 139  Potassium 3.5 - 5.1 mmol/L 3.7 3.5 3.7  Chloride 98 - 111 mmol/L 102 103 105  CO2 22 - 32 mmol/L $RemoveB'27 24 26  'TRqlXvOx$ Calcium 8.9 - 10.3 mg/dL 8.9 8.8(L) 9.1  Total Protein 6.5 - 8.1 g/dL 6.7 6.7 6.5  Total Bilirubin 0.3 - 1.2 mg/dL 2.0(H) 1.3(H) 1.4(H)  Alkaline Phos 38 - 126 U/L 46 58 49  AST 15 - 41 U/L $Remo'26 26 27  'TYlKR$ ALT 0 - 44 U/L $Remo'27 29 30      'IgRbg$ Component Value Date/Time   RBC 4.85 05/11/2020 0819   MCV 95.7 05/11/2020 0819   MCH 32.0 05/11/2020 0819   MCHC 33.4 05/11/2020 0819   RDW  12.7 05/11/2020 0819   LYMPHSABS 0.3 (L) 05/11/2020 0819   MONOABS 0.7 05/11/2020 0819   EOSABS 0.0 05/11/2020 0819   BASOSABS 0.0 05/11/2020 0819   Lab Results  Component Value Date   LDH 149 05/11/2020   LDH 164 02/01/2020   LDH 178 10/28/2019    DIAGNOSTIC IMAGING:  I have independently reviewed the scans and discussed with the patient. CT Chest W Contrast  Result Date: 05/11/2020 CLINICAL DATA:  Follow-up CLL, ongoing chemotherapy EXAM: CT CHEST, ABDOMEN, AND PELVIS WITH CONTRAST TECHNIQUE: Multidetector CT imaging of the chest, abdomen and pelvis was performed following the standard protocol during bolus administration of intravenous contrast. CONTRAST:  175mL OMNIPAQUE IOHEXOL 300 MG/ML SOLN, additional oral enteric contrast COMPARISON:  06/30/2019 FINDINGS: CT CHEST FINDINGS Cardiovascular: Left coronary artery calcifications. Normal heart size. No pericardial effusion. Mediastinum/Nodes: No significant change in multiple enlarged lower paraesophageal lymph nodes, largest node measuring 2.8 x 1.2 cm (series 2, image 51). No enlarged upper mediastinal, hilar, or axillary lymph nodes. Thyroid gland, trachea, and esophagus demonstrate no significant findings. Lungs/Pleura: Multiple stable, small, definitively benign pulmonary nodules for example a 5 mm nodule of the left upper lobe (series 3, image 80). No pleural effusion or pneumothorax. Musculoskeletal: No chest wall mass or suspicious bone lesions identified. CT ABDOMEN PELVIS FINDINGS Hepatobiliary: No solid liver abnormality is seen. No gallstones, gallbladder wall thickening, or biliary dilatation. Pancreas: Unremarkable. No pancreatic ductal dilatation or surrounding inflammatory changes. Spleen: Mild splenomegaly, not significantly changed, maximum coronal span 14.2 cm (series 4, image 110). Adrenals/Urinary Tract: Adrenal glands are unremarkable. Simple cyst of the superior pole of the left kidney. Kidneys are otherwise normal,  without renal calculi, solid lesion, or hydronephrosis. Bladder is  unremarkable. Stomach/Bowel: Stomach is within normal limits. Appendix appears normal. No evidence of bowel wall thickening, distention, or inflammatory changes. Vascular/Lymphatic: Aortic atherosclerosis. Unchanged prominent left retroperitoneal, bilateral iliac, and inguinal lymph nodes, largest left retroperitoneal node measuring 1.4 x 0.8 cm (series 2, image 84), largest right inguinal lymph node measuring 4.2 x 1.1 cm (series 2, image 127). Reproductive: No mass or other abnormality. Other: No abdominal wall hernia or abnormality. No abdominopelvic ascites. Musculoskeletal: No acute or significant osseous findings. IMPRESSION: 1. No significant change in multiple enlarged lower paraesophageal, retroperitoneal, iliac, and inguinal lymph nodes. 2. Mild splenomegaly, not significantly changed, maximum coronal span 14.2 cm. 3.  Multiple stable, small, definitively benign pulmonary nodules. 4.  Coronary artery disease.  Aortic Atherosclerosis (ICD10-I70.0). Electronically Signed   By: Lauralyn Primes M.D.   On: 05/11/2020 09:57   CT Abdomen Pelvis W Contrast  Result Date: 05/11/2020 CLINICAL DATA:  Follow-up CLL, ongoing chemotherapy EXAM: CT CHEST, ABDOMEN, AND PELVIS WITH CONTRAST TECHNIQUE: Multidetector CT imaging of the chest, abdomen and pelvis was performed following the standard protocol during bolus administration of intravenous contrast. CONTRAST:  OMNIPAQUE IOHEXOL 300 MG/ML SOLN, additional oral enteric contrast COMPARISON:  06/30/2019 FINDINGS: CT CHEST FINDINGS Cardiovascular: Left coronary artery calcifications. Normal heart size. No pericardial effusion. Mediastinum/Nodes: No significant change in multiple enlarged lower paraesophageal lymph nodes, largest node measuring 2.8 x 1.2 cm (series 2, image 51). No enlarged upper mediastinal, hilar, or axillary lymph nodes. Thyroid gland, trachea, and esophagus demonstrate no  significant findings. Lungs/Pleura: Multiple stable, small, definitively benign pulmonary nodules for example a 5 mm nodule of the left upper lobe (series 3, image 80). No pleural effusion or pneumothorax. Musculoskeletal: No chest wall mass or suspicious bone lesions identified. CT ABDOMEN PELVIS FINDINGS Hepatobiliary: No solid liver abnormality is seen. No gallstones, gallbladder wall thickening, or biliary dilatation. Pancreas: Unremarkable. No pancreatic ductal dilatation or surrounding inflammatory changes. Spleen: Mild splenomegaly, not significantly changed, maximum coronal span 14.2 cm (series 4, image 110). Adrenals/Urinary Tract: Adrenal glands are unremarkable. Simple cyst of the superior pole of the left kidney. Kidneys are otherwise normal, without renal calculi, solid lesion, or hydronephrosis. Bladder is unremarkable. Stomach/Bowel: Stomach is within normal limits. Appendix appears normal. No evidence of bowel wall thickening, distention, or inflammatory changes. Vascular/Lymphatic: Aortic atherosclerosis. Unchanged prominent left retroperitoneal, bilateral iliac, and inguinal lymph nodes, largest left retroperitoneal node measuring 1.4 x 0.8 cm (series 2, image 84), largest right inguinal lymph node measuring 4.2 x 1.1 cm (series 2, image 127). Reproductive: No mass or other abnormality. Other: No abdominal wall hernia or abnormality. No abdominopelvic ascites. Musculoskeletal: No acute or significant osseous findings. IMPRESSION: 1. No significant change in multiple enlarged lower paraesophageal, retroperitoneal, iliac, and inguinal lymph nodes. 2. Mild splenomegaly, not significantly changed, maximum coronal span 14.2 cm. 3.  Multiple stable, small, definitively benign pulmonary nodules. 4.  Coronary artery disease.  Aortic Atherosclerosis (ICD10-I70.0). Electronically Signed   By: Lauralyn Primes M.D.   On: 05/11/2020 09:57     ASSESSMENT:  1.  Stage IV small lymphocytic lymphoma: -CLL FISH  panel normal, Ig HV positive, T p53 negative. -Ibrutinib 420 mg started on 12/01/2017, 6 cycles of rituximab from 12/14/2017 through 05/21/2018. -CT CAP on 06/30/2019 showed interval decrease in size of multiple bilateral iliac and pelvic sidewall and inguinal lymph nodes.  Largest left inguinal lymph node measures 1.7 x 1.3 cm.  Additional unchanged enlarged lower paraesophageal and retroperitoneal lymph nodes present.  Multiple stable likely benign small  pulmonary nodules.   PLAN:  1.  Stage IV small lymphocytic lymphoma: -He is tolerating ibrutinib full dose very well.  No bleeding issues reported. -We reviewed results of CT from 05/03/2020 which showed stable lymphadenopathy and mild splenomegaly. -Reviewed labs from 05/03/2020 with white count 10.4, normal differential.  Platelet count is 109.  Hemoglobin normal.  LDH was normal.  LFTs show elevated total bilirubin 2.0.  Electrolytes are normal. -He will continue ibrutinib at the same dose level.  RTC 4 months with labs.  2.  Mild thrombocytopenia: -Platelet count ranging between 120-140, likely from splenomegaly and myelosuppression.  Today platelet count is 109.  3.  Mild hyperbilirubinemia: -Mildly elevated total bilirubin since 2017 has been stable.  Today bilirubin is 2.0.  We will closely monitor.  4.  Elevated creatinine: -Creatinine has improved to 1.06.  5.  High risk drug monitoring: -Reviewed possibility of developing atrial fibrillation or bleeding while on ibrutinib.  Recommend seeking medical attention if experiencing palpitations.  Orders placed this encounter:  No orders of the defined types were placed in this encounter.    Derek Jack, MD [place] [phone]   I, Milinda Antis, am acting as a scribe for Dr. Sanda Linger.  I, Derek Jack MD, have reviewed the above documentation for accuracy and completeness, and I agree with the above.

## 2020-05-16 NOTE — Progress Notes (Signed)
Confirmed that patient is taking Imbruvica and denies any side effects.

## 2020-05-22 ENCOUNTER — Other Ambulatory Visit: Payer: Self-pay | Admitting: Cardiology

## 2020-05-25 MED FILL — IMBRUVICA 420 MG TAB: 420 | 28 days supply | Qty: 28 | Fill #1

## 2020-06-14 ENCOUNTER — Ambulatory Visit: Payer: Managed Care, Other (non HMO) | Admitting: Cardiology

## 2020-06-14 ENCOUNTER — Other Ambulatory Visit: Payer: Self-pay

## 2020-06-14 ENCOUNTER — Encounter: Payer: Self-pay | Admitting: Cardiology

## 2020-06-14 VITALS — BP 154/90 | HR 61 | Ht 76.0 in | Wt 262.6 lb

## 2020-06-14 DIAGNOSIS — E785 Hyperlipidemia, unspecified: Secondary | ICD-10-CM | POA: Diagnosis not present

## 2020-06-14 DIAGNOSIS — R030 Elevated blood-pressure reading, without diagnosis of hypertension: Secondary | ICD-10-CM

## 2020-06-14 DIAGNOSIS — I2510 Atherosclerotic heart disease of native coronary artery without angina pectoris: Secondary | ICD-10-CM

## 2020-06-14 DIAGNOSIS — I2119 ST elevation (STEMI) myocardial infarction involving other coronary artery of inferior wall: Secondary | ICD-10-CM

## 2020-06-14 DIAGNOSIS — I951 Orthostatic hypotension: Secondary | ICD-10-CM

## 2020-06-14 NOTE — Patient Instructions (Signed)
Medication Instructions:  No changes  *If you need a refill on your cardiac medications before your next appointment, please call your pharmacy*   Other Instructions  record and monitor  your Blood pressure about 2 to 3 times a week randomly and take to primary visit in Jan 2022  Lab Work: Not needed If you have labs (blood work) drawn today and your tests are completely normal, you will receive your results only by: [name] MyChart Message (if you have MyChart) OR . A paper copy in the mail If you have any lab test that is abnormal or we need to change your treatment, we will call you to review the results.   Testing/Procedures:  Not needed  Follow-Up: At [facility], you and your health needs are our priority.  As part of our continuing mission to provide you with exceptional heart care, we have created designated Provider Care Teams.  These Care Teams include your primary Cardiologist (physician) and Advanced Practice Providers (APPs -  Physician Assistants and Nurse Practitioners) who all work together to provide you with the care you need, when you need it.     Your next appointment:   12 month(s)  The format for your next appointment:   In Person  Provider:   Glenetta Hew, MD

## 2020-06-14 NOTE — Progress Notes (Signed)
Primary Care Provider: Asencion Noble, MD Cardiologist: Glenetta Hew, MD Electrophysiologist: None  Clinic Note: Chief Complaint  Patient presents with  . Follow-up    Doing well.  . Coronary Artery Disease    No angina    HPI:    Adam Reyes is a 64 y.o. male with a PMH notable for borderline CAD (nonflow limiting LAD-D1 lesion-medical management) as well as CLL/SLL below who presents today for annual follow-up.   MI EY:EMVVKP possible ST elevation MI versus non-STEMI. -- Sx was "a little chest pain that did not go away &some shortness of breath". -->Cath showed moderate bifurcation LAD-D1 disease that was non-flow-limiting. Plan was to treat medically.   Evaluated with GXT stress test that was negative for ischemia @ 9 min.  Problem List Items Addressed This Visit    ST elevation myocardial infarction (STEMI) of inferolateral wall, initial episode of care [facility]) (Chronic)   Coronary artery disease involving native coronary artery of native heart without angina pectoris - Primary (Chronic)   Relevant Orders   EKG 12-Lead (Completed)   Dyslipidemia, goal LDL below 70 (Chronic)   Elevated blood pressure reading (Chronic)   Syncope due to orthostatic hypotension     Adam Reyes was last seen on June 01 2019 -> he is doing very well.  No major issues.  Try to walk at least 30 minutes a day (weather permitting).  He was running late coming into the appointment, because he was dealing with taking a 9 point buck that he had taken during the morning of deer hunting.  They were taken to the butcher shop and he ran late.  He was therefore stressed out.  He denied any chest tightness pressure or dyspnea.  Only exertional dyspnea if he really exerts himself, for instance trying to carry the deer.  Recent Hospitalizations: None  Reviewed  CV studies:    The following studies were reviewed today: (if available, images/films reviewed: From Epic Chart or Care  Everywhere) . None:   Interval History:   Adam Reyes returns here today doing okay.  He tells me he just had a large cup of coffee, and his blood pressure that was 130/75 this morning was now 154/90.  Routinely his pressures in the 224S to 975P systolic at home.  The only major cardiac issues is noted is that he may get short of breath if he is walking uphill or going up several flights of steps.  He feels like when he gets short of breath he also gets lightheaded.  With routine level of exertion and his daily walks, he is doing fine.  He also has had one episode of vasovagal syncope that occurred when he was giving blood-> this has been a longstanding issue for him.  CV Review of Symptoms (Summary): positive for - Exertional dyspnea with increased level of exertion.  Rare flip-flopping palpitations (but no tachycardia).  Vasovagal syncope x1 negative for - chest pain, edema, irregular heartbeat, orthopnea, paroxysmal nocturnal dyspnea, rapid heart rate, shortness of breath or Any further syncope or near syncope of the one episode.  TIA/amaurosis fugax, claudication.  The patient does not have symptoms concerning for COVID-19 infection (fever, chills, cough, or new shortness of breath).   REVIEWED OF SYSTEMS   Notable positive symptoms: Otherwise negative, if not noted above  Mild intentional weight loss.  GERD depending what he eats.  Normal arthritis pains  Environmental allergies with runny nose/congestion  Easy bruising.   I have reviewed  and (if needed) personally updated the patient's problem list, medications, allergies, past medical and surgical history, social and family history.   PAST MEDICAL HISTORY   Past Medical History:  Diagnosis Date  . 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 Enlarged lymph node    left neck   . STEMI (ST elevation myocardial infarction) ([place]) 03/05/2017   hx/notes 03/05/2017 -likely aborted anterior STEMI with 50% bifurcation LAD-Diag1.  No PCI.  Preserved EF    PAST SURGICAL HISTORY   Past Surgical History:  Procedure Laterality Date  . COLONOSCOPY N/A 10/11/2015   Procedure: COLONOSCOPY;  Surgeon: Rogene Houston, MD;  [facility];  Service: Endoscopy;  Laterality: N/A;  10/11/2015  . Graded Exercise Tolerance Test (GXT/ETT)  05/2017   10.7 METs (9: 25 min.  Reached 103% max predicted heart rate).  No EKG findings to suggest coronary ischemia.  Negative, low risk GXT  . LEFT HEART CATH AND CORONARY ANGIOGRAPHY N/A 03/05/2017   Procedure: LEFT HEART CATH AND CORONARY ANGIOGRAPHY;  Surgeon: Leonie Man, MD;  Location: [facility];  Service: Cardiovascular: pLAD-Diag1 50-55% (non-flow-limiiting).  EF ~50-55%.  although bifurcation lesion was presumed Culprit - no PCI (not flow limiting). - Med Rx.  [name] MASS BIOPSY Left 06/27/2015   Procedure: OPEN LEFT NECK BIOPSY ;  Surgeon: Leta Baptist, MD;  Location: [place];  Service: ENT;  Laterality: Left;  . REFRACTIVE SURGERY Bilateral     Immunization History  Administered Date(s) Administered  . Influenza,inj,Quad PF,6+ Mos 07/11/2015, 04/23/2018  . Pneumococcal Conjugate-13 08/02/2015  . Tdap 04/23/2018    MEDICATIONS/ALLERGIES   Current Meds  Medication Sig  . aspirin 81 MG chewable tablet Chew 1 tablet (81 mg total) by mouth every other day.  [name] atorvastatin (LIPITOR) 40 MG tablet TAKE 1 TABLET(40 MG) BY MOUTH DAILY  . metoprolol tartrate (LOPRESSOR) 25 MG tablet TAKE 1/2 TABLET(12.5 MG) BY MOUTH TWICE DAILY  . naproxen sodium (ANAPROX) 550 MG tablet TK 1 T PO Q 12 H PRN  . nitroGLYCERIN (NITROSTAT) 0.4 MG SL tablet Place 1 tablet (0.4 mg total) under the tongue every 5 (five) minutes as needed for chest pain.  . [DISCONTINUED] IMBRUVICA 420 MG TABS TAKE 1 TABLET (420 MG) BY MOUTH DAILY.     No Known Allergies  SOCIAL HISTORY/FAMILY HISTORY   Reviewed in Epic:  Pertinent findings: No change.  Still walking routinely  OBJCTIVE -PE, EKG, labs   Wt Readings from Last 3 Encounters:  06/14/20 262 lb 9.6 oz (119.1 kg)  05/16/20 263 lb 3.2 oz (119.4 kg)  02/08/20 (!) 267 lb 1.6 oz (121.2 kg)    Physical Exam: BP (!) 154/90   Pulse 61   Ht 6\' 4"  (1.93 m)   Wt 262 lb 9.6 oz (119.1 kg)   BMI 31.96 kg/m  Physical Exam Vitals reviewed.  Constitutional:      General: He is not in acute distress.    Appearance: Normal appearance. He is obese. He is not ill-appearing or toxic-appearing.  HENT:     Head: Normocephalic and atraumatic.  Neck:     Vascular: No carotid bruit, hepatojugular reflux or JVD.  Cardiovascular:     Rate and Rhythm: Normal rate and regular rhythm.  No extrasystoles are present.    Chest Wall: PMI is not displaced.  Pulses: Normal pulses and intact distal pulses.     Heart sounds: S1 normal and S2 normal. Heart sounds are distant. No murmur heard. No friction rub. No gallop. No S4 sounds.   Pulmonary:     Effort: Pulmonary effort is normal. No respiratory distress.     Breath sounds: Normal breath sounds.  Chest:     Chest wall: No tenderness.  Musculoskeletal:        General: No swelling. Normal range of motion.     Cervical back: Normal range of motion and neck supple.  Neurological:     General: No focal deficit present.     Mental Status: He is alert and oriented to person, place, and time.  Psychiatric:        Mood and Affect: Mood normal.        Behavior: Behavior normal.        Thought Content: Thought content normal.        Judgment: Judgment normal.     Comments: Very good spirits.     Adult ECG Report  Rate: 61 ;  Rhythm: normal sinus rhythm and Borderline LVH.  Otherwise normal axis and intervals durations.;   Narrative Interpretation: Stable EKG  Recent Labs: January 2019: TC 101, TG 60, HDL 37, LDL 54.  Hgb 15.5; Cr  4.06, K+ 3.7. Lab Results  Component Value Date   CHOL 111 11/18/2018   HDL 39 (L) 11/18/2018   LDLCALC 54 11/18/2018   TRIG 90 11/18/2018   CHOLHDL 2.8 11/18/2018   Lab Results  Component Value Date   CREATININE 1.06 05/11/2020   BUN 25 (H) 05/11/2020   NA 137 05/11/2020   K 3.7 05/11/2020   CL 102 05/11/2020   CO2 27 05/11/2020   No results found for: TSH  ASSESSMENT/PLAN    Problem List Items Addressed This Visit    ST elevation myocardial infarction (STEMI) of inferolateral wall, initial episode of care [facility]) (Chronic)    He is now over 3 years out from his episode.  No flow-limiting lesions noted.  I suspect he probably recanalization.  Clearly the decision to avoid stenting the bifurcation lesion was warranted since he has not had any further anginal symptoms. The desire is to keep this lesion stable.  Plan:  He completed his DAPT, now on aspirin alone.  He is on atorvastatin and metoprolol stable doses.  As needed nitroglycerin      Coronary artery disease involving native coronary artery of native heart without angina pectoris - Primary (Chronic)    Medical management of moderate LAD-D1 bifurcation lesion not felt to be flow-limiting. She does have nonflow limiting lesion.  Plan is for aggressive medical management.  Plan: Continue current medications including aspirin, statin (with excellent lipid control), and beta-blocker.  Probably need better blood pressure control.  He is due to see his PCP soon and they can reassess.  Since he was at rest today, did not assess.      Relevant Orders   EKG 12-Lead (Completed)   Dyslipidemia, goal LDL below 70 (Chronic)    Excellent labs from last year.  Remains on atorvastatin-tolerating well.  Should be due for PCP recheck labs next month or so.      Relevant Orders   EKG 12-Lead (Completed)   Elevated blood pressure reading (Chronic)    His blood pressures are definitely higher than I would like him to be Other  than having orthostatic vasovagal type syncope symptoms.  I would not be  overly aggressive.  He would like to try to avoid being on medications for hypertension besides metoprolol because of these orthostatic symptoms.  Plan for now we'll have him monitor with a blood pressure log to take that to his PCP we will see in January.  We TAVR using as needed medications were his blood pressure to be higher, I think quickest PRN will be sublingual nitroglycerin      Relevant Orders   EKG 12-Lead (Completed)   Syncope due to orthostatic hypotension    He had another vasovagal type syncope.  As such, not really overaggressive to treat his blood pressures. Vascepa of the importance of adequate hydration as well as nutrition.    Recommendation: Continue adequate hydration and proper nutrition.  Avoid excess heat with duration.  Also, can consider pressure stockings to help improve intravascular volume.  Would avoid diuretic.  Does not sound these episodes related to arrhythmias.  Continue to monitor.         COVID-19 Education: The signs and symptoms of COVID-19 were discussed with the patient and how to seek care for testing (follow up with PCP or arrange E-visit).   The importance of social distancing and COVID-19 vaccination was discussed today. 1 min The patient is practicing social distancing & Masking.   I spent a total of 57minutes with the patient spent in direct patient consultation.  Additional time spent with chart review  / charting (studies, outside notes, etc): 8 Total Time: 35 min   Current medicines are reviewed at length with the patient today.  (+/- concerns) 3  This visit occurred during the SARS-CoV-2 public health emergency.  Safety protocols were in place, including screening questions prior to the visit, additional usage of staff PPE, and extensive cleaning of exam room while observing appropriate contact time as indicated for disinfecting solutions.  Notice: This  dictation was prepared with Dragon dictation along with smaller phrase technology. Any transcriptional errors that result from this process are unintentional and may not be corrected upon review.  Patient Instructions / Medication Changes & Studies & Tests Ordered   Patient Instructions  Medication Instructions:  No changes  *If you need a refill on your cardiac medications before your next appointment, please call your pharmacy*   Other Instructions  record and monitor  your Blood pressure about 2 to 3 times a week randomly and take to primary visit in Jan 2022  Lab Work: Not needed If you have labs (blood work) drawn today and your tests are completely normal, you will receive your results only by: [name] MyChart Message (if you have MyChart) OR . A paper copy in the mail If you have any lab test that is abnormal or we need to change your treatment, we will call you to review the results.   Testing/Procedures:  Not needed  Follow-Up: At [facility], you and your health needs are our priority.  As part of our continuing mission to provide you with exceptional heart care, we have created designated Provider Care Teams.  These Care Teams include your primary Cardiologist (physician) and Advanced Practice Providers (APPs -  Physician Assistants and Nurse Practitioners) who all work together to provide you with the care you need, when you need it.     Your next appointment:   12 month(s)  The format for your next appointment:   In Person  Provider:   Glenetta Hew, MD     Studies Ordered:   Orders Placed This Encounter  Procedures  .  EKG 12-Lead     Glenetta Hew, M.D., M.S. Interventional Cardiologist   Pager # [phone] Phone # [phone] [address] [place], [place] [address]   Thank you for choosing Heartcare at [facility]!!

## 2020-06-23 ENCOUNTER — Other Ambulatory Visit (HOSPITAL_COMMUNITY): Payer: Self-pay | Admitting: Hematology

## 2020-06-23 ENCOUNTER — Other Ambulatory Visit (HOSPITAL_COMMUNITY): Payer: Self-pay

## 2020-06-23 ENCOUNTER — Encounter (HOSPITAL_COMMUNITY): Payer: Self-pay

## 2020-06-23 DIAGNOSIS — C8300 Small cell B-cell lymphoma, unspecified site: Secondary | ICD-10-CM

## 2020-06-23 MED ORDER — IMBRUVICA 420 MG PO TABS: ORAL_TABLET | ORAL | 3 refills | Status: DC

## 2020-06-23 MED FILL — IMBRUVICA 420 MG TAB: 420 | 28 days supply | Qty: 28 | Fill #0

## 2020-06-23 NOTE — Progress Notes (Signed)
Chart reviewed. Patient's Ibrutinib refilled per Dr. Delton Coombes

## 2020-06-24 ENCOUNTER — Encounter: Payer: Self-pay | Admitting: Cardiology

## 2020-06-24 NOTE — Assessment & Plan Note (Signed)
Medical management of moderate LAD-D1 bifurcation lesion not felt to be flow-limiting. She does have nonflow limiting lesion.  Plan is for aggressive medical management.  Plan: Continue current medications including aspirin, statin (with excellent lipid control), and beta-blocker.  Probably need better blood pressure control.  He is due to see his PCP soon and they can reassess.  Since he was at rest today, did not assess.

## 2020-06-24 NOTE — Assessment & Plan Note (Addendum)
Excellent labs from last year.  Remains on atorvastatin-tolerating well.  Should be due for PCP recheck labs next month or so.

## 2020-06-24 NOTE — Assessment & Plan Note (Signed)
He had another vasovagal type syncope.  As such, not really overaggressive to treat his blood pressures. Vascepa of the importance of adequate hydration as well as nutrition.    Recommendation: Continue adequate hydration and proper nutrition.  Avoid excess heat with duration.  Also, can consider pressure stockings to help improve intravascular volume.  Would avoid diuretic.  Does not sound these episodes related to arrhythmias.  Continue to monitor.

## 2020-06-24 NOTE — Assessment & Plan Note (Signed)
He is now over 3 years out from his episode.  No flow-limiting lesions noted.  I suspect he probably recanalization.  Clearly the decision to avoid stenting the bifurcation lesion was warranted since he has not had any further anginal symptoms. The desire is to keep this lesion stable.  Plan:  He completed his DAPT, now on aspirin alone.  He is on atorvastatin and metoprolol stable doses.  As needed nitroglycerin

## 2020-06-24 NOTE — Assessment & Plan Note (Addendum)
His blood pressures are definitely higher than I would like him to be Other than having orthostatic vasovagal type syncope symptoms.  I would not be overly aggressive.  He would like to try to avoid being on medications for hypertension besides metoprolol because of these orthostatic symptoms.  Plan for now we'll have him monitor with a blood pressure log to take that to his PCP we will see in January.  We TAVR using as needed medications were his blood pressure to be higher, I think quickest PRN will be sublingual nitroglycerin

## 2020-07-24 MED FILL — IMBRUVICA 420 MG TAB: 420 | 28 days supply | Qty: 28 | Fill #1

## 2020-08-17 MED FILL — IMBRUVICA 420 MG TAB: 420 | 28 days supply | Qty: 28 | Fill #2

## 2020-08-31 ENCOUNTER — Ambulatory Visit (INDEPENDENT_AMBULATORY_CARE_PROVIDER_SITE_OTHER): Payer: Managed Care, Other (non HMO) | Admitting: Internal Medicine

## 2020-08-31 ENCOUNTER — Encounter (INDEPENDENT_AMBULATORY_CARE_PROVIDER_SITE_OTHER): Payer: Self-pay | Admitting: Internal Medicine

## 2020-08-31 ENCOUNTER — Other Ambulatory Visit: Payer: Self-pay

## 2020-08-31 VITALS — BP 154/70 | HR 86 | Temp 97.1°F | Ht 76.0 in | Wt 260.2 lb

## 2020-08-31 DIAGNOSIS — R0609 Other forms of dyspnea: Secondary | ICD-10-CM

## 2020-08-31 DIAGNOSIS — C8300 Small cell B-cell lymphoma, unspecified site: Secondary | ICD-10-CM

## 2020-08-31 DIAGNOSIS — R0600 Dyspnea, unspecified: Secondary | ICD-10-CM

## 2020-08-31 DIAGNOSIS — E559 Vitamin D deficiency, unspecified: Secondary | ICD-10-CM

## 2020-08-31 DIAGNOSIS — Z131 Encounter for screening for diabetes mellitus: Secondary | ICD-10-CM | POA: Diagnosis not present

## 2020-08-31 DIAGNOSIS — E785 Hyperlipidemia, unspecified: Secondary | ICD-10-CM

## 2020-08-31 DIAGNOSIS — Z125 Encounter for screening for malignant neoplasm of prostate: Secondary | ICD-10-CM

## 2020-08-31 NOTE — Progress Notes (Signed)
Metrics: Intervention Frequency ACO  Documented Smoking Status Yearly  Screened one or more times in 24 months  Cessation Counseling or  Active cessation medication Past 24 months  Past 24 months   Guideline developer: UpToDate (See UpToDate for funding source) Date Released: 2014       Wellness Office Visit  Subjective:  Patient ID: Adam Reyes, male    DOB: 1956/03/26  Age: 65 y.o. MRN: [id number]  CC: This 65 year old man comes in to establish care.  He is switching primary care doctors. HPI  He had a history of ST elevation MI, inferior in 2018.  He tells me that for the last 3 to 6 months, he has been getting dyspneic on exertion.  He denies any PND or orthopnea.  It is usually related to exertion and he feels some chest tightness also.  He apparently did see his cardiologist in December 2021 . He also describes episodes of dizziness and lightheadedness. He is an ex-smoker but when he was smoking cigarettes, he did not smoke many at all. He also has CLL and is under the care of oncology, Dr. Delton Coombes. Past Medical History:  Diagnosis Date  . 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 Enlarged lymph node    left neck  . STEMI (ST elevation myocardial infarction) ([place]) 03/05/2017   hx/notes 03/05/2017 -likely aborted anterior STEMI with 50% bifurcation LAD-Diag1.  No PCI.  Preserved EF   Past Surgical History:  Procedure Laterality Date  . COLONOSCOPY N/A 10/11/2015   Procedure: COLONOSCOPY;  Surgeon: Rogene Houston, MD;  [facility];  Service: Endoscopy;  Laterality: N/A;  10/11/2015  . Graded Exercise Tolerance Test (GXT/ETT)  05/2017   10.7 METs (9: 25 min.  Reached 103% max predicted heart rate).  No EKG findings to suggest coronary ischemia.  Negative, low risk GXT  . LEFT HEART CATH AND CORONARY ANGIOGRAPHY N/A  03/05/2017   Procedure: LEFT HEART CATH AND CORONARY ANGIOGRAPHY;  Surgeon: Leonie Man, MD;  Location: [place] [facility];  Service: Cardiovascular: pLAD-Diag1 50-55% (non-flow-limiiting).  EF ~50-55%.  although bifurcation lesion was presumed Culprit - no PCI (not flow limiting). - Med Rx.  [name] MASS BIOPSY Left 06/27/2015   Procedure: OPEN LEFT NECK BIOPSY ;  Surgeon: Leta Baptist, MD;  Location: [facility];  Service: ENT;  Laterality: Left;  . REFRACTIVE SURGERY Bilateral      Family History  Problem Relation Age of Onset  . Diabetes Mother   . Cancer Father   . Cancer Brother     Social History   Social History Narrative   Married since 2001,third.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   Social History   Tobacco Use  . Smoking status: Former Smoker    Years: 2.00    Types: Cigarettes  . Smokeless tobacco: Former Systems developer    Types: Chew, Snuff  . Tobacco comment: 03/06/2017 "quit smoking when I was young; quit chew/snuff in ~ 2008"  Substance Use Topics  . Alcohol use: Yes    Alcohol/week: 2.0 standard drinks    Types: 2 Cans of beer per week    Current Meds  Medication Sig  . aspirin 81 MG chewable tablet Chew 1 tablet (81 mg total) by mouth every other day.  [name] atorvastatin (LIPITOR) 40 MG tablet TAKE 1 TABLET(40 MG) BY  MOUTH DAILY  . Ibrutinib (IMBRUVICA) 420 MG TABS TAKE 1 TABLET (420 MG) BY MOUTH DAILY.  . metoprolol tartrate (LOPRESSOR) 25 MG tablet TAKE 1/2 TABLET(12.5 MG) BY MOUTH TWICE DAILY  . naproxen sodium (ANAPROX) 550 MG tablet TK 1 T PO Q 12 H PRN       Objective:   Today's Vitals: BP (!) 154/70 (BP Location: Right Arm, Patient Position: Sitting, Cuff Size: Normal)   Pulse 86   Temp (!) 97.1 F (36.2 C) (Temporal)   Ht 6\' 4"  (1.93 m)   Wt 260 lb 3.2 oz (118 kg)   SpO2 99%   BMI 31.67 kg/m  Vitals with BMI 08/31/2020 06/14/2020 05/16/2020  Height 6\' 4"  6\' 4"  -  Weight 260 lbs 3 oz 262 lbs 10 oz 263 lbs 3 oz  BMI 03.15 94.58 -   Systolic 592 924 462  Diastolic 70 90 76  Pulse 86 61 72     Physical Exam  He looks systemically well.  He is obese.  Blood pressure is not well controlled systolically.  Heart sounds are present without gallop rhythm.  There are no carotid bruits.  Lung fields are clear with no evidence of crackles, wheezing or bronchial breathing.     Assessment   1. Lymphoma, small lymphocytic ([place])   2. Dyslipidemia, goal LDL below 70   3. Dyspnea on exertion   4. Screening for diabetes mellitus   5. Vitamin D deficiency disease   6. Special screening for malignant neoplasm of prostate       Tests ordered Orders Placed This Encounter  Procedures  . COMPLETE METABOLIC PANEL WITH GFR  . Hemoglobin A1c  . Lipid panel  . T3, free  . T4, free  . TSH  . VITAMIN D 25 Hydroxy (Vit-D Deficiency, Fractures)  . PSA, Total with Reflex to PSA, Free     Plan: 1. I am concerned about this man's dyspnea on exertion and I think he needs review again from cardiology.  I would recommend that he might benefit from a CT cardiac scan.  This will show anatomy without the need for cardiac catheterization and I will send a message to his cardiologist regarding the best course of action. 2. Blood work is ordered. 3. I will follow-up with him in the next month or so to discuss all the results and further recommendations.   No orders of the defined types were placed in this encounter.   [name], MD

## 2020-09-01 LAB — COMPLETE METABOLIC PANEL WITH GFR
AG Ratio: 2.7 (calc) — ABNORMAL HIGH (ref 1.0–2.5)
ALT: 14 U/L (ref 9–46)
AST: 14 U/L (ref 10–35)
Albumin: 4.1 g/dL (ref 3.6–5.1)
Alkaline phosphatase (APISO): 50 U/L (ref 35–144)
BUN: 20 mg/dL (ref 7–25)
CO2: 28 mmol/L (ref 20–32)
Calcium: 8.6 mg/dL (ref 8.6–10.3)
Chloride: 106 mmol/L (ref 98–110)
Creat: 1.13 mg/dL (ref 0.70–1.25)
GFR, Est African American: 80 mL/min/{1.73_m2} (ref >=60)
GFR, Est Non African American: 69 mL/min/{1.73_m2} (ref >=60)
Globulin: 1.5 g/dL (calc) — ABNORMAL LOW (ref 1.9–3.7)
Glucose, Bld: 77 mg/dL (ref 65–139)
Potassium: 3.7 mmol/L (ref 3.5–5.3)
Sodium: 140 mmol/L (ref 135–146)
Total Bilirubin: 0.5 mg/dL (ref 0.2–1.2)
Total Protein: 5.6 g/dL — ABNORMAL LOW (ref 6.1–8.1)

## 2020-09-01 LAB — PSA, TOTAL WITH REFLEX TO PSA, FREE: PSA, Total: 0.6 ng/mL (ref <=4.0)

## 2020-09-01 LAB — VITAMIN D 25 HYDROXY (VIT D DEFICIENCY, FRACTURES): Vit D, 25-Hydroxy: 24 ng/mL — ABNORMAL LOW (ref 30–100)

## 2020-09-01 LAB — LIPID PANEL
Cholesterol: 90 mg/dL (ref <200)
HDL: 35 mg/dL — ABNORMAL LOW (ref >=40)
LDL Cholesterol (Calc): 38 mg/dL (calc)
Non-HDL Cholesterol (Calc): 55 mg/dL (calc) (ref <130)
Total CHOL/HDL Ratio: 2.6 (calc) (ref <5.0)
Triglycerides: 86 mg/dL (ref <150)

## 2020-09-01 LAB — TSH: TSH: 1.59 mIU/L (ref 0.40–4.50)

## 2020-09-01 LAB — T4, FREE: Free T4: 1.4 ng/dL (ref 0.8–1.8)

## 2020-09-01 LAB — HEMOGLOBIN A1C
Hgb A1c MFr Bld: 5.1 % of total Hgb (ref <5.7)
Mean Plasma Glucose: 100 mg/dL
eAG (mmol/L): 5.5 mmol/L

## 2020-09-01 LAB — T3, FREE: T3, Free: 3.3 pg/mL (ref 2.3–4.2)

## 2020-09-07 ENCOUNTER — Encounter (INDEPENDENT_AMBULATORY_CARE_PROVIDER_SITE_OTHER): Payer: Self-pay | Admitting: Internal Medicine

## 2020-09-08 ENCOUNTER — Telehealth: Payer: Self-pay | Admitting: *Deleted

## 2020-09-08 NOTE — Telephone Encounter (Signed)
-----   Message from Leonie Man, MD sent at 09/04/2020  3:43 PM EST ----- Regarding: RE: Concerning symptoms  Hi, I was out of town this weekend and just seeing this now.  We can work on getting him in in the next week or so.    DH ----- Message ----- From: [name], MD Sent: 09/01/2020  10:25 AM EST To: Leonie Man, MD Subject: Concerning symptoms                            Hi there! I saw this mutual patient for the first time this week. As you know,he has a history of CAD and he describes dyspnea on exertion as well as chest tightness with exertion for several months. You saw him in December 2021 but are not due to see him for another year. I would appreciate it if you would get him in for a sooner appointment to reevaluate his symptoms. I think he may benefit from CT Cardiac scan that seems to be very useful nowadays. Thanks!

## 2020-09-08 NOTE — Telephone Encounter (Signed)
Spoke to patient . Appointment schedule for  09/13/20 at 1:20 pm -- c/o dizziness and shortness of breath Patient verbalized understanding ot appointment time.  ( has appt at 4 pm for labs in [place])

## 2020-09-13 ENCOUNTER — Encounter: Payer: Self-pay | Admitting: Cardiology

## 2020-09-13 ENCOUNTER — Other Ambulatory Visit: Payer: Self-pay

## 2020-09-13 ENCOUNTER — Ambulatory Visit: Payer: Managed Care, Other (non HMO) | Admitting: Cardiology

## 2020-09-13 ENCOUNTER — Ambulatory Visit (INDEPENDENT_AMBULATORY_CARE_PROVIDER_SITE_OTHER): Payer: Managed Care, Other (non HMO)

## 2020-09-13 ENCOUNTER — Encounter: Payer: Self-pay | Admitting: *Deleted

## 2020-09-13 ENCOUNTER — Inpatient Hospital Stay (HOSPITAL_COMMUNITY): Payer: Managed Care, Other (non HMO) | Attending: Hematology

## 2020-09-13 VITALS — BP 150/78 | HR 78 | Ht 76.0 in | Wt 257.6 lb

## 2020-09-13 DIAGNOSIS — R0789 Other chest pain: Secondary | ICD-10-CM

## 2020-09-13 DIAGNOSIS — R0609 Other forms of dyspnea: Secondary | ICD-10-CM | POA: Insufficient documentation

## 2020-09-13 DIAGNOSIS — G9331 Postviral fatigue syndrome: Secondary | ICD-10-CM

## 2020-09-13 DIAGNOSIS — I493 Ventricular premature depolarization: Secondary | ICD-10-CM

## 2020-09-13 DIAGNOSIS — G933 Postviral fatigue syndrome: Secondary | ICD-10-CM

## 2020-09-13 DIAGNOSIS — I2119 ST elevation (STEMI) myocardial infarction involving other coronary artery of inferior wall: Secondary | ICD-10-CM

## 2020-09-13 DIAGNOSIS — R0600 Dyspnea, unspecified: Secondary | ICD-10-CM

## 2020-09-13 DIAGNOSIS — E785 Hyperlipidemia, unspecified: Secondary | ICD-10-CM

## 2020-09-13 DIAGNOSIS — I25119 Atherosclerotic heart disease of native coronary artery with unspecified angina pectoris: Secondary | ICD-10-CM

## 2020-09-13 DIAGNOSIS — I252 Old myocardial infarction: Secondary | ICD-10-CM

## 2020-09-13 DIAGNOSIS — C8300 Small cell B-cell lymphoma, unspecified site: Secondary | ICD-10-CM | POA: Diagnosis not present

## 2020-09-13 DIAGNOSIS — R072 Precordial pain: Secondary | ICD-10-CM

## 2020-09-13 DIAGNOSIS — D509 Iron deficiency anemia, unspecified: Secondary | ICD-10-CM | POA: Insufficient documentation

## 2020-09-13 DIAGNOSIS — R030 Elevated blood-pressure reading, without diagnosis of hypertension: Secondary | ICD-10-CM

## 2020-09-13 LAB — CBC WITH DIFFERENTIAL/PLATELET
Abs Immature Granulocytes: 0.09 10*3/uL — ABNORMAL HIGH (ref 0.00–0.07)
Basophils Absolute: 0 10*3/uL (ref 0.0–0.1)
Basophils Relative: 0 %
Eosinophils Absolute: 0.1 10*3/uL (ref 0.0–0.5)
Eosinophils Relative: 1 %
HCT: 24.7 % — ABNORMAL LOW (ref 39.0–52.0)
Hemoglobin: 7.1 g/dL — ABNORMAL LOW (ref 13.0–17.0)
Immature Granulocytes: 1 %
Lymphocytes Relative: 8 %
Lymphs Abs: 0.6 10*3/uL — ABNORMAL LOW (ref 0.7–4.0)
MCH: 23.3 pg — ABNORMAL LOW (ref 26.0–34.0)
MCHC: 28.7 g/dL — ABNORMAL LOW (ref 30.0–36.0)
MCV: 81 fL (ref 80.0–100.0)
Monocytes Absolute: 0.8 10*3/uL (ref 0.1–1.0)
Monocytes Relative: 10 %
Neutro Abs: 6.4 10*3/uL (ref 1.7–7.7)
Neutrophils Relative %: 80 %
Platelets: 222 10*3/uL (ref 150–400)
RBC: 3.05 MIL/uL — ABNORMAL LOW (ref 4.22–5.81)
RDW: 15.2 % (ref 11.5–15.5)
WBC: 8 10*3/uL (ref 4.0–10.5)
nRBC: 0 % (ref 0.0–0.2)

## 2020-09-13 LAB — LACTATE DEHYDROGENASE: LDH: 126 U/L (ref 98–192)

## 2020-09-13 LAB — COMPREHENSIVE METABOLIC PANEL
ALT: 16 U/L (ref 0–44)
AST: 16 U/L (ref 15–41)
Albumin: 3.8 g/dL (ref 3.5–5.0)
Alkaline Phosphatase: 51 U/L (ref 38–126)
Anion gap: 10 (ref 5–15)
BUN: 16 mg/dL (ref 8–23)
CO2: 23 mmol/L (ref 22–32)
Calcium: 8.5 mg/dL — ABNORMAL LOW (ref 8.9–10.3)
Chloride: 104 mmol/L (ref 98–111)
Creatinine, Ser: 1.03 mg/dL (ref 0.61–1.24)
GFR, Estimated: >60 mL/min (ref >60)
Glucose, Bld: 82 mg/dL (ref 70–99)
Potassium: 3.6 mmol/L (ref 3.5–5.1)
Sodium: 137 mmol/L (ref 135–145)
Total Bilirubin: 0.7 mg/dL (ref 0.3–1.2)
Total Protein: 6.3 g/dL — ABNORMAL LOW (ref 6.5–8.1)

## 2020-09-13 LAB — MAGNESIUM: Magnesium: 2.2 mg/dL (ref 1.7–2.4)

## 2020-09-13 NOTE — Patient Instructions (Addendum)
Medication Instructions:   NOT NEDED *If you need a refill on your cardiac medications before your next appointment, please call your pharmacy*   Lab Work: NOT NEEDED If you have labs (blood work) drawn today and your tests are completely normal, you will receive your results only by: [name] MyChart Message (if you have MyChart) OR . A paper copy in the mail If you have any lab test that is abnormal or we need to change your treatment, we will call you to review the results.   Testing/Procedures:  WILL BE SCHEDULE AT [place] Your physician has requested that you have an echocardiogram. Echocardiography is a painless test that uses sound waves to create images of your heart. It provides your doctor with information about the size and shape of your heart and how well your heart's chambers and valves are working. This procedure takes approximately one hour. There are no restrictions for this procedure.     WILL BE MAILED TO YOU - Your physician has recommended that you wear a 7 DAY ZIO  holter monitor. Holter monitors are medical devices that record the heart's electrical activity. Doctors most often use these monitors to diagnose arrhythmias. Arrhythmias are problems with the speed or rhythm of the heartbeat. The monitor is a small, portable device. You can wear one while you do your normal daily activities. This is usually used to diagnose what is causing palpitations/syncope (passing out).   WILL BE SCHEDULE AT [place] Your physician has requested that you have a STRESS EXERCISE  myoview. Please follow instruction sheet, as given.  Follow-Up: At [facility], you and your health needs are our priority.  As part of our continuing mission to provide you with exceptional heart care, we have created designated Provider Care Teams.  These Care Teams include your primary Cardiologist (physician) and Advanced Practice Providers (APPs -  Physician  Assistants and Nurse Practitioners) who all work together to provide you with the care you need, when you need it.     Your next appointment:   2 month(s)  The format for your next appointment:   In Person  Provider:   Glenetta Hew, MD   Other Instructions   Applying the monitor   Shave hair from upper left chest.   Hold abrader disc by orange tab.  Rub abrader in 40 strokes over left upper chest as indicated in your monitor instructions.   Clean area with 4 enclosed alcohol pads .  Use all pads to assure are is cleaned thoroughly.  Let dry.   Apply patch as indicated in monitor instructions.  Patch will be place under collarbone on left side of chest with arrow pointing upward.   Rub patch adhesive wings for 2 minutes.Remove white label marked "1".  Remove white label marked "2".  Rub patch adhesive wings for 2 additional minutes.   While looking in a mirror, press and release button in center of patch.  A small green light will flash 3-4 times .  This will be your only indicator the monitor has been turned on.     Do not shower for the first 24 hours.  You may shower after the first 24 hours.   Press button if you feel a symptom. You will hear a small click.  Record Date, Time and Symptom in the Patient Log Book.   When you are ready to remove patch, follow instructions on last 2 pages of Patient Log Book.  Stick patch monitor onto last page of Patient Log Book.   Place Patient Log Book in [place] box.  Use locking tab on box and tape box closed securely.  The [employer] has [employer] on it.  Please place in mailbox as soon as possible.  Your physician should have your test results approximately 7 days after the monitor has been mailed back to [place].   Call [place] at [phone] if you have questions regarding your ZIO XT patch monitor.  Call them immediately if you see an orange light blinking on your monitor.   If your  monitor falls off in less than 4 days contact our Monitor department at [phone].  If your monitor becomes loose or falls off after 4 days call [employer] at [phone] for suggestions on securing your monitor.

## 2020-09-13 NOTE — Progress Notes (Signed)
Patient ID: Adam Reyes, male   DOB: 12/10/1955, 65 y.o.   MRN: [id number] Patient enrolled for [employer] to ship a 7 day ZIO XT long term holter monitor to his home.

## 2020-09-13 NOTE — Progress Notes (Signed)
Primary Care Provider: [name], MD Cardiologist: Glenetta Hew, MD Electrophysiologist: None  Clinic Note: Chief Complaint  Patient presents with  . Shortness of Breath    Worsening exertional shortness of breath  . Chest Pain    now noting exertional exertional chest tightness as well.   ===================================  ASSESSMENT/PLAN   Problem List Items Addressed This Visit    Frequent PVCs    He had several PVCs noted during my exam.  1 seen on EKG.  I will concern now with him having worsening exertional dyspnea and chest discomfort that this could be related to either a structural or ischemic etiology.  I also want to determine what his true burden is.  Plan:  Ischemic evaluation with Myoview stress test  Structural evaluation with echocardiogram  7-day Zio patch monitor.      Relevant Orders   EKG 12-Lead (Completed)   LONG TERM MONITOR (3-14 DAYS)   ECHOCARDIOGRAM COMPLETE   MYOCARDIAL PERFUSION IMAGING   DOE (dyspnea on exertion) - Primary    He has had some exertional dyspnea, but he did not mention to me that it was as significant as he currently is now.  I am concerned that he may have had some type of exacerbation with his potential COVID-19 exposure/infection.  In addition to ischemic evaluation, I do want to evaluate for structural normality. Plan: 2D echocardiogram along with Myoview stress test.      Relevant Orders   EKG 12-Lead (Completed)   LONG TERM MONITOR (3-14 DAYS)   ECHOCARDIOGRAM COMPLETE   MYOCARDIAL PERFUSION IMAGING   Chest tightness    Exertional chest tightness in the patient with known moderate CAD.  At this point I think we just simply need ischemic evaluation that do not require anatomic evaluation with coronary CTA.  .  Plan: We will attempt Treadmill Myoview Stress Test, okay to switch to [place].      Relevant Orders   EKG 12-Lead (Completed)   LONG TERM MONITOR (3-14 DAYS)   ECHOCARDIOGRAM COMPLETE    MYOCARDIAL PERFUSION IMAGING   Post viral syndrome    He clearly had symptoms of viral infection back in January shortly after being exposed to a colleague with COVID-19.  Quite likely he did have COVID-19, was relatively asymptomatic because of his vaccination status.  Despite this, he has had more notable fatigue and exertional dyspnea since this infection.  He also now has significant PVCs noted on exam.  Plan: Structural evaluation with echocardiogram.      Relevant Orders   EKG 12-Lead (Completed)   LONG TERM MONITOR (3-14 DAYS)   ECHOCARDIOGRAM COMPLETE   MYOCARDIAL PERFUSION IMAGING   Old myocardial infarction (Chronic)    Angiographically, not a STEMI.  He did have elevated troponins, but with no flow-limiting lesion.  There is bifurcation LAD diagonal lesion that did not correlate with his EKG changes. Was evaluated with a GXT showed no evidence of ischemic changes and no chest pain.   He is now having exertional dyspnea symptoms associated with occasional chest tightness and PVCs on exam.  Plan: 2D echocardiogram evaluation, Myoview, and Zio patch monitor.      Coronary artery disease involving native coronary artery of native heart with angina pectoris ([place]) (Chronic)    He has bifurcation LAD diagonal lesion known from previous angiogram.  What we like to know analysis is  does he have ischemia from either this lesion or a new lesion. Coronary CTA would be helpful if we do  not already have a baseline knowledge of his anatomy.  He is on aspirin, statin and beta-blocker  Plan: Continue, aspirin, statin and beta-blocker  Since he is now having symptoms, I would like to evaluate for ischemia, but feel that a Myoview stress test would be a more fitting in this occasion-does not require use of contrast (given the potential for need for cardiac catheterization in future).    Shared Decision Making/Informed Consent{ All outpatient stress tests require an informed consent  ([id number]) ATTESTATION ORDER The risks [chest pain, shortness of breath, cardiac arrhythmias, dizziness, blood pressure fluctuations, myocardial infarction, stroke/transient ischemic attack, nausea, vomiting, allergic reaction, radiation exposure, metallic taste sensation and life-threatening complications (estimated to be 1 in 10,000)], benefits (risk stratification, diagnosing coronary artery disease, treatment guidance) and alternatives of a nuclear stress test were discussed in detail with Adam Reyes and he agrees to proceed.      Dyslipidemia, goal LDL below 70 (Chronic)    Labs checked in February look great.  He is on 40 mg atorvastatin.      Elevated blood pressure reading (Chronic)    Does have high blood pressure readings when he comes into the clinic.  This is not consistent with his pressures at home.  We will see what his pressures are during his Myoview.  May consider titration of medications based on findings.       Other Visit Diagnoses    Chest tightness       Relevant Orders   MYOCARDIAL PERFUSION IMAGING      ===================================  HPI:    Adam Reyes is a 65 y.o. male with a PMH notable for ACS (borderline STEMI versus non-STEMI) with borderline CAD (reviewed below), along with borderline Hypertension, Hyperlipidemia, and CLL (small lymphocytic) along with recent diagnosis of COVID-19 in January 2022 who is being seen today for the reevaluation o of Exertional Shortness of Breath and Chest Pain/Fatigue at the request of Gosrani, Nimish C, MD.   MI CZ:YSAYTK possible ST elevation MI versus non-STEMI. -- Sx was "a little chest pain that did not go away &some shortness of breath". -->Cath showed moderate bifurcation LAD-D1 disease that was non-flow-limiting. Plan was to treat medically.   Evaluated withGXTstress test that was negative for ischemia@ 9 min.  Adam Reyes was recently seen on June 14, 2020 for routine follow-up.  His blood  pressure was elevated in the clinic, but he tells me at home his pressures are usually in the 120-130/60s mmHg when recorded at home.  He noted mitigating exertional dyspnea walking uphill or going up several flights of steps, this is associated with some lightheadedness.  Doing fine with routine activity.  No chest pain or pressure.  1 episode of vasovagal syncope occurring while giving blood.  Rare flip-flopping palpitations.  No tachycardia.  Recent Hospitalizations: None  Adam Reyes was recently seen by his new PCP Adam Reyes, Adam Johns, MD) to establish with a new primary care doctor.  At this visit, he noted having about 3 to 6 months of worsening dyspnea exertion (not consistent with the history provided to me).  He also said that he is occasionally having chest tightness along with episodes of dizziness and lightheadedness. => Based on his symptoms, Dr. Anastasio Reyes suggested that he follow back up with me for potential Cardiac CTA.   Reviewed  CV studies:    The following studies were reviewed today: (if available, images/films reviewed: From Epic Chart or Care Everywhere) . No new studies:  Interval History:  Adam Reyes  returns here today with his wife (ostensibly to make sure that he tells the whole truth).  Apparently, January 14-15 he was quite ill with respiratory symptoms and low-grade fever.  He had most likely been exposed to COVID-19 (his work partner had tested positive for Covid).  He likely was vaccinated and did not have a very significant course, but he never did get tested. It appears that really since this episode, he has been noticing worsening shortness of breath on exertion.  Now doing routine walking and activity level is associated with dyspnea and occasionally also while having some chest tightness and pressure.  He has not had resting dyspnea or chest discomfort.  But the intensity and frequency of his exertional dyspnea with less activity along with occasional chest  tightness is concerning.  Usually when he stops to rest, the symptoms do resolve on their own.  Really since the likely Covid infection, he has been having a lot of fatigue and exertional dyspnea that seems worse than it had been.  He will occasionally even noticed lightheadedness when he is bends down and leans up. He also described another episode of syncope associated with sneezing.  He does not describe any irregular heartbeats or palpitations or any tachycardia.  CV Review of Symptoms (Summary): positive for - chest pain, dyspnea on exertion and Lightheadedness and dizziness, one episode of passing out while sneezing. negative for - edema, irregular heartbeat, orthopnea, palpitations, paroxysmal nocturnal dyspnea, rapid heart rate, shortness of breath or No further syncope.  No TIA or amaurosis fugax, claudication.  The patient does not have symptoms concerning for COVID-19 infection (fever, chills, cough, or new shortness of breath).   REVIEWED OF SYSTEMS   Review of Systems  Constitutional: Positive for malaise/fatigue (notably worse since his January illness.). Negative for weight loss.  HENT: Negative for congestion and nosebleeds.   Respiratory: Positive for shortness of breath (Per HPI).        No further URI symptoms since mid January.  Gastrointestinal: Negative for melena.  Genitourinary: Negative for hematuria.  Musculoskeletal: Positive for joint pain.  Neurological: Positive for dizziness and loss of consciousness (Per HPI).  Psychiatric/Behavioral: Negative for depression and memory loss. The patient is not nervous/anxious and does not have insomnia.    I have reviewed and (if needed) personally updated the patient's problem list, medications, allergies, past medical and surgical history, social and family history.   PAST MEDICAL HISTORY   Past Medical History:  Diagnosis Date  . 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 Coronary  artery disease, non-occlusive 02/2017   Cardiac cath in setting of MI: 50 and 55% bifurcation LAD-Diag1  . Enlarged lymph node    left neck  . STEMI (ST elevation myocardial infarction) ([place]) 03/05/2017   hx/notes 03/05/2017 -likely aborted anterior STEMI with 50% bifurcation LAD-Diag1.  No PCI.  Preserved EF    PAST SURGICAL HISTORY   Past Surgical History:  Procedure Laterality Date  . COLONOSCOPY N/A 10/11/2015   Procedure: COLONOSCOPY;  Surgeon: Rogene Houston, MD;  [facility];  Service: Endoscopy;  Laterality: N/A;  10/11/2015  . Graded Exercise Tolerance Test (GXT/ETT)  05/2017   10.7 METs (9: 25 min.  Reached 103% max predicted heart rate).  No EKG findings to suggest coronary ischemia.  Negative, low risk GXT  . LEFT HEART CATH AND CORONARY ANGIOGRAPHY N/A 03/05/2017   Procedure: LEFT HEART CATH AND CORONARY ANGIOGRAPHY;  Surgeon: Leonie Man, MD;  Location: [facility];  Service: Cardiovascular: pLAD-Diag1 50-55% (non-flow-limiiting).  EF ~50-55%.  although bifurcation lesion was presumed Culprit - no PCI (not flow limiting). - Med Rx.  [name] MASS BIOPSY Left 06/27/2015   Procedure: OPEN LEFT NECK BIOPSY ;  Surgeon: Leta Baptist, MD;  Location: [facility];  Service: ENT;  Laterality: Left;  . REFRACTIVE SURGERY Bilateral    Cardiac Cath 03/05/2017    Immunization History  Administered Date(s) Administered  . Influenza,inj,Quad PF,6+ Mos 07/11/2015, 04/23/2018  . Moderna Sars-Covid-2 Vaccination 02/13/2020, 03/11/2020  . Pneumococcal Conjugate-13 08/02/2015  . Tdap 04/23/2018    MEDICATIONS/ALLERGIES   Current Meds  Medication Sig  . aspirin 81 MG chewable tablet Chew 1 tablet (81 mg total) by mouth every other day.  [name] atorvastatin (LIPITOR) 40 MG tablet TAKE 1 TABLET(40 MG) BY MOUTH DAILY  . Ibrutinib (IMBRUVICA) 420 MG TABS TAKE 1 TABLET (420 MG) BY MOUTH DAILY.  . metoprolol tartrate (LOPRESSOR) 25 MG tablet TAKE 1/2 TABLET(12.5 MG) BY  MOUTH TWICE DAILY  . naproxen sodium (ANAPROX) 550 MG tablet TK 1 T PO Q 12 H PRN    No Known Allergies  SOCIAL HISTORY/FAMILY HISTORY   Reviewed in Epic:  Pertinent findings:  Social History   Tobacco Use  . Smoking status: Former Smoker    Years: 2.00    Types: Cigarettes  . Smokeless tobacco: Former Systems developer    Types: Chew, Snuff  . Tobacco comment: 03/06/2017 "quit smoking when I was young; quit chew/snuff in ~ 2008"  Vaping Use  . Vaping Use: Never used  Substance Use Topics  . Alcohol use: Yes    Alcohol/week: 2.0 standard drinks    Types: 2 Cans of beer per week  . Drug use: No   Social History   Social History Narrative   Married since 2001,third.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    OBJCTIVE -PE, EKG, labs   Wt Readings from Last 3 Encounters:  09/13/20 257 lb 9.6 oz (116.8 kg)  08/31/20 260 lb 3.2 oz (118 kg)  06/14/20 262 lb 9.6 oz (119.1 kg)    Physical Exam: BP (!) 150/78   Pulse 78   Ht 6\' 4"  (1.93 m)   Wt 257 lb 9.6 oz (116.8 kg)   SpO2 96%   BMI 31.36 kg/m  Physical Exam Vitals reviewed.  Constitutional:      General: He is not in acute distress.    Appearance: Normal appearance. He is obese. He is not ill-appearing or toxic-appearing.     Comments: relatively healthy appearing.  Mildly obese.  Well-groomed.  HENT:     Head: Normocephalic and atraumatic.  Neck:     Vascular: No carotid bruit, hepatojugular reflux or JVD.  Cardiovascular:     Rate and Rhythm: Normal rate and regular rhythm. Frequent extrasystoles are present.    Chest Wall: PMI is not displaced.     Pulses: Normal pulses.     Heart sounds: S1 normal and S2 normal. Heart sounds are distant. No murmur heard. No gallop.   Pulmonary:     Effort: Pulmonary effort is normal. No respiratory distress.     Breath sounds: Normal breath sounds.  Chest:     Chest wall: No tenderness.  Musculoskeletal:        General: Swelling (Trivial) present. Normal range of motion.      Cervical back: Normal range of motion and neck supple.  Skin:    General:  Skin is warm and dry.     Coloration: Skin is not pale.  Neurological:     General: No focal deficit present.     Mental Status: He is alert and oriented to person, place, and time.     Motor: No weakness.     Gait: Gait normal.  Psychiatric:        Mood and Affect: Mood normal.        Behavior: Behavior normal.        Thought Content: Thought content normal.        Judgment: Judgment normal.     Adult ECG Report  Rate: 78 ;  Rhythm: normal sinus rhythm and premature ventricular contractions (PVC); otherwise normal axis, intervals and durations.  Narrative Interpretation: Normal with exception of PVC.  Recent Labs: Reviewed Lab Results  Component Value Date   CHOL 90 08/31/2020   HDL 35 (L) 08/31/2020   LDLCALC 38 08/31/2020   TRIG 86 08/31/2020   CHOLHDL 2.6 08/31/2020   Lab Results  Component Value Date   CREATININE 1.03 09/13/2020   BUN 16 09/13/2020   NA 137 09/13/2020   K 3.6 09/13/2020   CL 104 09/13/2020   CO2 23 09/13/2020   CBC Latest Ref Rng & Units 09/13/2020 05/11/2020 02/01/2020  WBC 4.0 - 10.5 K/uL 8.0 10.4 6.4  Hemoglobin 13.0 - 17.0 g/dL 7.1(L) 15.5 15.8  Hematocrit 39.0 - 52.0 % 24.7(L) 46.4 45.9  Platelets 150 - 400 K/uL 222 109(L) 139(L)    Lab Results  Component Value Date   TSH 1.59 08/31/2020    ==================================================  COVID-19 Education: The signs and symptoms of COVID-19 were discussed with the patient and how to seek care for testing (follow up with PCP or arrange E-visit).   The importance of social distancing and COVID-19 vaccination was discussed today. The patient is practicing social distancing & Masking.   I spent a total of 61minutes with the patient spent in direct patient consultation.  Additional time spent with chart review  / charting (studies, outside notes, etc): 16 min Total Time: 44 min   Current medicines are  reviewed at length with the patient today.  (+/- concerns) n/a  This visit occurred during the SARS-CoV-2 public health emergency.  Safety protocols were in place, including screening questions prior to the visit, additional usage of staff PPE, and extensive cleaning of exam room while observing appropriate contact time as indicated for disinfecting solutions.  Notice: This dictation was prepared with Dragon dictation along with smaller phrase technology. Any transcriptional errors that result from this process are unintentional and may not be corrected upon review.  Patient Instructions / Medication Changes & Studies & Tests Ordered   Patient Instructions  Medication Instructions:   NOT NEDED *If you need a refill on your cardiac medications before your next appointment, please call your pharmacy*   Lab Work: NOT NEEDED If you have labs (blood work) drawn today and your tests are completely normal, you will receive your results only by: [name] MyChart Message (if you have MyChart) OR . A paper copy in the mail If you have any lab test that is abnormal or we need to change your treatment, we will call you to review the results.   Testing/Procedures:  WILL BE SCHEDULE AT [place] Your physician has requested that you have an echocardiogram. Echocardiography is a painless test that uses sound waves to create images of your heart. It provides your doctor with  information about the size and shape of your heart and how well your heart's chambers and valves are working. This procedure takes approximately one hour. There are no restrictions for this procedure.     WILL BE MAILED TO YOU - Your physician has recommended that you wear a 7 DAY ZIO  holter monitor. Holter monitors are medical devices that record the heart's electrical activity. Doctors most often use these monitors to diagnose arrhythmias. Arrhythmias are problems with the speed or rhythm of the heartbeat. The  monitor is a small, portable device. You can wear one while you do your normal daily activities. This is usually used to diagnose what is causing palpitations/syncope (passing out).   WILL BE SCHEDULE AT [place] Your physician has requested that you have a STRESS EXERCISE  myoview. Please follow instruction sheet, as given.  Follow-Up: At [facility], you and your health needs are our priority.  As part of our continuing mission to provide you with exceptional heart care, we have created designated Provider Care Teams.  These Care Teams include your primary Cardiologist (physician) and Advanced Practice Providers (APPs -  Physician Assistants and Nurse Practitioners) who all work together to provide you with the care you need, when you need it.     Your next appointment:   2 month(s)  The format for your next appointment:   In Person  Provider:   Glenetta Hew, MD   Other Instructions Instructions on Monitor application provided   Studies Ordered:   Orders Placed This Encounter  Procedures  . LONG TERM MONITOR (3-14 DAYS)  . MYOCARDIAL PERFUSION IMAGING  . EKG 12-Lead  . ECHOCARDIOGRAM COMPLETE     Glenetta Hew, M.D., M.S. Interventional Cardiologist   Pager # [phone] Phone # [phone] [address] [place], [place] [address]   Thank you for choosing Heartcare at [facility]!!

## 2020-09-14 DIAGNOSIS — R0789 Other chest pain: Secondary | ICD-10-CM | POA: Diagnosis not present

## 2020-09-14 DIAGNOSIS — I493 Ventricular premature depolarization: Secondary | ICD-10-CM

## 2020-09-14 DIAGNOSIS — G933 Postviral fatigue syndrome: Secondary | ICD-10-CM

## 2020-09-14 DIAGNOSIS — R0600 Dyspnea, unspecified: Secondary | ICD-10-CM

## 2020-09-14 DIAGNOSIS — I2119 ST elevation (STEMI) myocardial infarction involving other coronary artery of inferior wall: Secondary | ICD-10-CM

## 2020-09-14 MED ORDER — OCTREOTIDE ACETATE 30 MG IM KIT
PACK | INTRAMUSCULAR | Status: AC
  Filled 2020-09-14: qty 2

## 2020-09-14 MED FILL — IMBRUVICA 420 MG TAB: 420 | 28 days supply | Qty: 28 | Fill #3

## 2020-09-16 ENCOUNTER — Encounter: Payer: Self-pay | Admitting: Cardiology

## 2020-09-16 NOTE — Assessment & Plan Note (Signed)
Angiographically, not a STEMI.  He did have elevated troponins, but with no flow-limiting lesion.  There is bifurcation LAD diagonal lesion that did not correlate with his EKG changes. Was evaluated with a GXT showed no evidence of ischemic changes and no chest pain.   He is now having exertional dyspnea symptoms associated with occasional chest tightness and PVCs on exam.  Plan: 2D echocardiogram evaluation, Myoview, and Zio patch monitor.

## 2020-09-16 NOTE — Assessment & Plan Note (Signed)
He clearly had symptoms of viral infection back in January shortly after being exposed to a colleague with COVID-19.  Quite likely he did have COVID-19, was relatively asymptomatic because of his vaccination status.  Despite this, he has had more notable fatigue and exertional dyspnea since this infection.  He also now has significant PVCs noted on exam.  Plan: Structural evaluation with echocardiogram.

## 2020-09-16 NOTE — Assessment & Plan Note (Signed)
He had several PVCs noted during my exam.  1 seen on EKG.  I will concern now with him having worsening exertional dyspnea and chest discomfort that this could be related to either a structural or ischemic etiology.  I also want to determine what his true burden is.  Plan:  Ischemic evaluation with Myoview stress test  Structural evaluation with echocardiogram  7-day Zio patch monitor.

## 2020-09-16 NOTE — Assessment & Plan Note (Addendum)
He has bifurcation LAD diagonal lesion known from previous angiogram.  What we like to know analysis is  does he have ischemia from either this lesion or a new lesion. Coronary CTA would be helpful if we do not already have a baseline knowledge of his anatomy.  He is on aspirin, statin and beta-blocker  Plan: Continue, aspirin, statin and beta-blocker  Since he is now having symptoms, I would like to evaluate for ischemia, but feel that a Myoview stress test would be a more fitting in this occasion-does not require use of contrast (given the potential for need for cardiac catheterization in future).    Shared Decision Making/Informed Consent{ All outpatient stress tests require an informed consent ([id number]) ATTESTATION ORDER The risks [chest pain, shortness of breath, cardiac arrhythmias, dizziness, blood pressure fluctuations, myocardial infarction, stroke/transient ischemic attack, nausea, vomiting, allergic reaction, radiation exposure, metallic taste sensation and life-threatening complications (estimated to be 1 in 10,000)], benefits (risk stratification, diagnosing coronary artery disease, treatment guidance) and alternatives of a nuclear stress test were discussed in detail with Adam Reyes and he agrees to proceed.

## 2020-09-16 NOTE — Assessment & Plan Note (Addendum)
Exertional chest tightness in the patient with known moderate CAD.  At this point I think we just simply need ischemic evaluation that do not require anatomic evaluation with coronary CTA.  .  Plan: We will attempt Treadmill Myoview Stress Test, okay to switch to [place].

## 2020-09-16 NOTE — Assessment & Plan Note (Signed)
Labs checked in February look great.  He is on 40 mg atorvastatin.

## 2020-09-16 NOTE — Assessment & Plan Note (Signed)
Does have high blood pressure readings when he comes into the clinic.  This is not consistent with his pressures at home.  We will see what his pressures are during his Myoview.  May consider titration of medications based on findings.

## 2020-09-16 NOTE — Assessment & Plan Note (Signed)
He has had some exertional dyspnea, but he did not mention to me that it was as significant as he currently is now.  I am concerned that he may have had some type of exacerbation with his potential COVID-19 exposure/infection.  In addition to ischemic evaluation, I do want to evaluate for structural normality. Plan: 2D echocardiogram along with Myoview stress test.

## 2020-09-18 ENCOUNTER — Encounter (HOSPITAL_COMMUNITY): Payer: Self-pay

## 2020-09-18 ENCOUNTER — Other Ambulatory Visit: Payer: Self-pay

## 2020-09-18 ENCOUNTER — Inpatient Hospital Stay (HOSPITAL_COMMUNITY): Payer: Managed Care, Other (non HMO) | Admitting: Hematology

## 2020-09-18 ENCOUNTER — Encounter (HOSPITAL_COMMUNITY): Payer: Self-pay | Admitting: Hematology

## 2020-09-18 VITALS — BP 149/84 | HR 91 | Temp 98.7°F | Resp 16 | Wt 260.2 lb

## 2020-09-18 DIAGNOSIS — D649 Anemia, unspecified: Secondary | ICD-10-CM | POA: Insufficient documentation

## 2020-09-18 DIAGNOSIS — C8300 Small cell B-cell lymphoma, unspecified site: Secondary | ICD-10-CM | POA: Diagnosis not present

## 2020-09-18 NOTE — Progress Notes (Signed)
Pt is taking Imbruvica as prescribed.  He is having dizziness at times, fatigue, and SOB with activity.  Dr. Delton Coombes has been made aware of the pt's symptoms.

## 2020-09-18 NOTE — Patient Instructions (Signed)
[place] at [facility] Discharge Instructions  You were seen today by Dr. Delton Coombes. He went over your recent results. You will have labs drawn tomorrow; you will be given stool cards to check for hidden blood in stool. You will be scheduled to have 2 iron infusions 1 week apart. Purchase iron tablets over the counter and take 1 tablet daily; purchase a stool softener and take daily as well to prevent constipation. You will be referred to Dr. Laural Golden for a colonoscopy to determine if you are bleeding in your intestines. Dr. Delton Coombes will see you back in 6 weeks for labs and follow up.   Thank you for choosing [place] at [facility] to provide your oncology and hematology care.  To afford each patient quality time with our provider, please arrive at least 15 minutes before your scheduled appointment time.   If you have a lab appointment with the [facility] please come in thru the Main Entrance and check in at the main information desk  You need to re-schedule your appointment should you arrive 10 or more minutes late.  We strive to give you quality time with our providers, and arriving late affects you and other patients whose appointments are after yours.  Also, if you no show three or more times for appointments you may be dismissed from the clinic at the providers discretion.     Again, thank you for choosing [facility].  Our hope is that these requests will decrease the amount of time that you wait before being seen by our physicians.       _____________________________________________________________  Should you have questions after your visit to [facility], please contact our office at [phone] between the hours of 8:00 a.m. and 4:30 p.m.  Voicemails left after 4:00 p.m. will not be returned until the following business day.  For prescription refill requests, have your pharmacy contact our office and allow  72 hours.    Cancer Center Support Programs:   > Cancer Support Group  2nd Tuesday of the month 1pm-2pm, Journey Room

## 2020-09-18 NOTE — Progress Notes (Signed)
[address] [place], [place] [address]   CLINIC:  Medical Oncology/Hematology  PCP:  [name], MD [place] / [place] [address]  [phone]  REASON FOR VISIT:  Follow-up for small lymphocytic lymphoma and symptomatic anemia  PRIOR THERAPY: Rituximab monthly from 01/01/2018 to 05/21/2018  CURRENT THERAPY: Ibrutinib 420 mg daily  INTERVAL HISTORY:  Adam Reyes, a 65 y.o. male, returns for routine follow-up for his small lymphocytic lymphoma and symptomatic anemia. Adam Reyes was last seen on 05/16/2020.  Today he is accompanied by his wife and he reports feeling fair. He reports having intermittent dark stool but denies seeing blood in it. He complains of having low energy levels and SOB with exertion since mid-January when he suspects he was infected with COVID; his symptoms were fever and cough, and several days later he developed SOB. He has been having chest tightness when he is exercising and walking. He is currently on a heart monitor since 03/04 for his PVC. He notes having numbness in his fingers which is chronic. He continues taking ibrutinib and tolerating it well.  He continues driving the gas rig.   REVIEW OF SYSTEMS:  Review of Systems  Constitutional: Positive for appetite change (60%) and fatigue (depleted).  Respiratory: Positive for chest tightness (w/ exertion) and shortness of breath (w/ exertion).   Cardiovascular: Positive for leg swelling and palpitations (on heart monitor).  Gastrointestinal: Negative for blood in stool.       Melena occasional  Genitourinary: Positive for dysuria (pain at end of stream).   Neurological: Positive for dizziness and numbness (fingers).  All other systems reviewed and are negative.   PAST MEDICAL/SURGICAL HISTORY:  Past Medical History:  Diagnosis Date  . 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 Coronary artery disease, non-occlusive  02/2017   Cardiac cath in setting of MI: 50 and 55% bifurcation LAD-Diag1  . Enlarged lymph node    left neck  . STEMI (ST elevation myocardial infarction) ([place]) 03/05/2017   hx/notes 03/05/2017 -likely aborted anterior STEMI with 50% bifurcation LAD-Diag1.  No PCI.  Preserved EF   Past Surgical History:  Procedure Laterality Date  . COLONOSCOPY N/A 10/11/2015   Procedure: COLONOSCOPY;  Surgeon: Rogene Houston, MD;  [facility];  Service: Endoscopy;  Laterality: N/A;  10/11/2015  . Graded Exercise Tolerance Test (GXT/ETT)  05/2017   10.7 METs (9: 25 min.  Reached 103% max predicted heart rate).  No EKG findings to suggest coronary ischemia.  Negative, low risk GXT  . LEFT HEART CATH AND CORONARY ANGIOGRAPHY N/A 03/05/2017   Procedure: LEFT HEART CATH AND CORONARY ANGIOGRAPHY;  Surgeon: Leonie Man, MD;  Location: [place] CV LAB;  Service: Cardiovascular: pLAD-Diag1 50-55% (non-flow-limiiting).  EF ~50-55%.  although bifurcation lesion was presumed Culprit - no PCI (not flow limiting). - Med Rx.  [name] MASS BIOPSY Left 06/27/2015   Procedure: OPEN LEFT NECK BIOPSY ;  Surgeon: Leta Baptist, MD;  Location: [place];  Service: ENT;  Laterality: Left;  . REFRACTIVE SURGERY Bilateral     SOCIAL HISTORY:  Social History   Socioeconomic History  . Marital status: Married    Spouse name: Not on file  . Number of children: Not on file  . Years of education: Not on file  . Highest education level: Not on file  Occupational History  . Not on file  Tobacco Use  .  Smoking status: Former Smoker    Years: 2.00    Types: Cigarettes  . Smokeless tobacco: Former Systems developer    Types: Chew, Snuff  . Tobacco comment: 03/06/2017 "quit smoking when I was young; quit chew/snuff in ~ 2008"  Vaping Use  . Vaping Use: Never used  Substance and Sexual Activity  . Alcohol use: Yes    Alcohol/week: 2.0 standard drinks    Types: 2 Cans of beer per week  . Drug use: No  . Sexual  activity: Yes  Other Topics Concern  . Not on file  Social History Narrative   Married since 2001,third.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   Social Determinants of Health   Financial Resource Strain: Low Risk   . Difficulty of Paying Living Expenses: Not hard at all  Food Insecurity: No Food Insecurity  . Worried About Charity fundraiser in the Last Year: Never true  . Ran Out of Food in the Last Year: Never true  Transportation Needs: No Transportation Needs  . Lack of Transportation (Medical): No  . Lack of Transportation (Non-Medical): No  Physical Activity: Sufficiently Active  . Days of Exercise per Week: 5 days  . Minutes of Exercise per Session: 30 min  Stress: No Stress Concern Present  . Feeling of Stress : Not at all  Social Connections: Moderately Integrated  . Frequency of Communication with Friends and Family: More than three times a week  . Frequency of Social Gatherings with Friends and Family: More than three times a week  . Attends Religious Services: More than 4 times per year  . Active Member of Clubs or Organizations: No  . Attends Archivist Meetings: Never  . Marital Status: Married  Human resources officer Violence: Not At Risk  . Fear of Current or Ex-Partner: No  . Emotionally Abused: No  . Physically Abused: No  . Sexually Abused: No    FAMILY HISTORY:  Family History  Problem Relation Age of Onset  . Diabetes Mother   . Cancer Father   . Cancer Brother     CURRENT MEDICATIONS:  Current Outpatient Medications  Medication Sig Dispense Refill  . aspirin 81 MG chewable tablet Chew 1 tablet (81 mg total) by mouth every other day. 30 tablet 11  . atorvastatin (LIPITOR) 40 MG tablet TAKE 1 TABLET(40 MG) BY MOUTH DAILY 90 tablet 3  . Ibrutinib (IMBRUVICA) 420 MG TABS TAKE 1 TABLET (420 MG) BY MOUTH DAILY. 28 tablet 3  . metoprolol tartrate (LOPRESSOR) 25 MG tablet TAKE 1/2 TABLET(12.5 MG) BY MOUTH TWICE DAILY 90 tablet 3  . naproxen sodium  (ANAPROX) 550 MG tablet TK 1 T PO Q 12 H PRN  0  . nitroGLYCERIN (NITROSTAT) 0.4 MG SL tablet Place 1 tablet (0.4 mg total) under the tongue every 5 (five) minutes as needed for chest pain. 25 tablet 6   No current facility-administered medications for this visit.    ALLERGIES:  No Known Allergies  PHYSICAL EXAM:  Performance status (ECOG): 0 - Asymptomatic  Vitals:   09/18/20 1604  BP: (!) 149/84  Pulse: 91  Resp: 16  Temp: 98.7 F (37.1 C)  SpO2: 96%   Wt Readings from Last 3 Encounters:  09/18/20 260 lb 3.2 oz (118 kg)  09/13/20 257 lb 9.6 oz (116.8 kg)  08/31/20 260 lb 3.2 oz (118 kg)   Physical Exam Vitals reviewed.  Constitutional:      Appearance: Normal appearance. He is obese.  Cardiovascular:  Rate and Rhythm: Normal rate and regular rhythm.     Pulses: Normal pulses.     Heart sounds: Normal heart sounds.  Pulmonary:     Effort: Pulmonary effort is normal.     Breath sounds: Normal breath sounds.  Abdominal:     Palpations: Abdomen is soft. There is no hepatomegaly, splenomegaly or mass.     Tenderness: There is no abdominal tenderness.     Hernia: No hernia is present.  Musculoskeletal:     Right lower leg: No edema.     Left lower leg: No edema.  Neurological:     General: No focal deficit present.     Mental Status: He is alert and oriented to person, place, and time.  Psychiatric:        Mood and Affect: Mood normal.        Behavior: Behavior normal.     LABORATORY DATA:  I have reviewed the labs as listed.  CBC Latest Ref Rng & Units 09/13/2020 05/11/2020 02/01/2020  WBC 4.0 - 10.5 K/uL 8.0 10.4 6.4  Hemoglobin 13.0 - 17.0 g/dL 7.1(L) 15.5 15.8  Hematocrit 39.0 - 52.0 % 24.7(L) 46.4 45.9  Platelets 150 - 400 K/uL 222 109(L) 139(L)   CMP Latest Ref Rng & Units 09/13/2020 08/31/2020 05/11/2020  Glucose 70 - 99 mg/dL 82 77 120(H)  BUN 8 - 23 mg/dL 16 20 25(H)  Creatinine 0.61 - 1.24 mg/dL 1.03 1.13 1.06  Sodium 135 - 145 mmol/L 137 140 137   Potassium 3.5 - 5.1 mmol/L 3.6 3.7 3.7  Chloride 98 - 111 mmol/L 104 106 102  CO2 22 - 32 mmol/L $RemoveB'23 28 27  'qpDhEXaZ$ Calcium 8.9 - 10.3 mg/dL 8.5(L) 8.6 8.9  Total Protein 6.5 - 8.1 g/dL 6.3(L) 5.6(L) 6.7  Total Bilirubin 0.3 - 1.2 mg/dL 0.7 0.5 2.0(H)  Alkaline Phos 38 - 126 U/L 51 - 46  AST 15 - 41 U/L $Remo'16 14 26  'Qtmji$ ALT 0 - 44 U/L $Remo'16 14 27      'SGVvb$ Component Value Date/Time   RBC 3.05 (L) 09/13/2020 1549   MCV 81.0 09/13/2020 1549   MCH 23.3 (L) 09/13/2020 1549   MCHC 28.7 (L) 09/13/2020 1549   RDW 15.2 09/13/2020 1549   LYMPHSABS 0.6 (L) 09/13/2020 1549   MONOABS 0.8 09/13/2020 1549   EOSABS 0.1 09/13/2020 1549   BASOSABS 0.0 09/13/2020 1549   Lab Results  Component Value Date   LDH 126 09/13/2020   LDH 149 05/11/2020   LDH 164 02/01/2020    DIAGNOSTIC IMAGING:  I have independently reviewed the scans and discussed with the patient. No results found.   ASSESSMENT:  1. Stage IV small lymphocytic lymphoma: -CLL FISH panel normal, Ig HV positive, T p53 negative. -Ibrutinib420 mg started on 12/01/2017, 6 cycles of rituximab from 12/14/2017 through 05/21/2018. -CT CAP on 06/30/2019 showed interval decrease in size of multiple bilateral iliac and pelvic sidewall and inguinal lymph nodes. Largest left inguinal lymph node measures 1.7 x 1.3 cm. Additional unchanged enlarged lower paraesophageal and retroperitoneal lymph nodes present. Multiple stable likely benign small pulmonary nodules.   PLAN:  1. Stage IV small lymphocytic lymphoma: -He is continuing to tolerate Ibrutinib very well. -Reportedly had upper respiratory infection suggestive of COVID-19 after exposure. -Since then he reported increased worsening shortness of breath. -Denies any fevers, night sweats or weight loss in the last 6 months.  No other infections reported. -Reviewed labs today which showed normal white count and platelet count.  Physical  examination did not reveal any palpable lymphadenopathy or  splenomegaly. -Continue Ibrutinib at this time.  2. Mild thrombocytopenia: -Platelet count has normalized to 222.  Prior mildly low platelet count secondary to myelosuppression and splenomegaly.  3. Mild hyperbilirubinemia: -Total bilirubin is 0.5.  4. Elevated creatinine: -Creatinine improved to 1.03.  5.  High risk drug monitoring: -No palpitations or atrial fibrillation.  6.  Normocytic anemia: -He has developed severe anemia with hemoglobin of 7.1 since October 2021. -MCV decreased to 81 from his baseline around 94.  He reported lightheadedness, shortness of breath on exertion.  He also reported occasional chest pains.  He is being evaluated by cardiology. -This is highly suggestive of blood loss anemia.  He reported occasional dark stools. -We will check stool for occult blood.  We will make referral to Dr. Laural Golden for colonoscopy. -We will obtain baseline ferritin, iron panel, Z96 and folic acid along with methylmalonic acid and copper levels. -For quick improvement in hemoglobin, I have recommended 2 infusions of Feraheme.  I have also recommended him to start taking iron tablet daily along with stool softener. -RTC 6 weeks with repeat labs.   Orders placed this encounter:  Orders Placed This Encounter  Procedures  . Reticulocytes  . Ferritin  . Iron and TIBC  . Vitamin B12  . Folate  . Methylmalonic acid, serum  . Copper, serum  . Occult blood x 1 card to lab, stool  . Occult blood x 1 card to lab, stool  . Occult blood x 1 card to lab, stool     Derek Jack, MD [place] [phone]   I, Milinda Antis, am acting as a scribe for Dr. Sanda Linger.  I, Derek Jack MD, have reviewed the above documentation for accuracy and completeness, and I agree with the above.

## 2020-09-19 ENCOUNTER — Other Ambulatory Visit: Payer: Self-pay

## 2020-09-19 ENCOUNTER — Telehealth (HOSPITAL_COMMUNITY): Payer: Self-pay | Admitting: Cardiology

## 2020-09-19 ENCOUNTER — Inpatient Hospital Stay (HOSPITAL_COMMUNITY): Payer: Managed Care, Other (non HMO)

## 2020-09-19 ENCOUNTER — Encounter (INDEPENDENT_AMBULATORY_CARE_PROVIDER_SITE_OTHER): Payer: Self-pay | Admitting: *Deleted

## 2020-09-19 ENCOUNTER — Other Ambulatory Visit: Payer: Self-pay | Admitting: *Deleted

## 2020-09-19 ENCOUNTER — Other Ambulatory Visit (HOSPITAL_COMMUNITY): Payer: Self-pay

## 2020-09-19 DIAGNOSIS — I252 Old myocardial infarction: Secondary | ICD-10-CM

## 2020-09-19 DIAGNOSIS — C8300 Small cell B-cell lymphoma, unspecified site: Secondary | ICD-10-CM

## 2020-09-19 DIAGNOSIS — C9110 Chronic lymphocytic leukemia of B-cell type not having achieved remission: Secondary | ICD-10-CM

## 2020-09-19 DIAGNOSIS — D649 Anemia, unspecified: Secondary | ICD-10-CM

## 2020-09-19 DIAGNOSIS — G9331 Postviral fatigue syndrome: Secondary | ICD-10-CM

## 2020-09-19 DIAGNOSIS — G933 Postviral fatigue syndrome: Secondary | ICD-10-CM

## 2020-09-19 DIAGNOSIS — R0600 Dyspnea, unspecified: Secondary | ICD-10-CM

## 2020-09-19 DIAGNOSIS — R0609 Other forms of dyspnea: Secondary | ICD-10-CM

## 2020-09-19 DIAGNOSIS — R072 Precordial pain: Secondary | ICD-10-CM

## 2020-09-19 LAB — IRON AND TIBC
Iron: 18 ug/dL — ABNORMAL LOW (ref 45–182)
Saturation Ratios: 4 % — ABNORMAL LOW (ref 17.9–39.5)
TIBC: 440 ug/dL (ref 250–450)
UIBC: 422 ug/dL

## 2020-09-19 LAB — FOLATE: Folate: 9.8 ng/mL (ref >5.9)

## 2020-09-19 LAB — RETICULOCYTES
Immature Retic Fract: 41.3 % — ABNORMAL HIGH (ref 2.3–15.9)
RBC.: 2.96 MIL/uL — ABNORMAL LOW (ref 4.22–5.81)
Retic Count, Absolute: 90.9 10*3/uL (ref 19.0–186.0)
Retic Ct Pct: 3.1 % (ref 0.4–3.1)

## 2020-09-19 LAB — FERRITIN: Ferritin: 4 ng/mL — ABNORMAL LOW (ref 24–336)

## 2020-09-19 LAB — VITAMIN B12: Vitamin B-12: 214 pg/mL (ref 180–914)

## 2020-09-19 NOTE — Telephone Encounter (Signed)
Patient called the Call Center and spoke with Adolph Pollack. And cancelled echocardiogram and Myoview per Oncologist due to is hemoglobin was to low to have testing. Patient will call back to reschedule. Orders will be removed from the [facility] and when pt calls back to reschedule we can reinstate the order. Thank you

## 2020-09-19 NOTE — Progress Notes (Unsigned)
Please sign Attestation order for stress myoview  patient has cancelled on 09/19/20 for now but will call back to reschedule - per his oncologist they did not want him to do echo or myoview- "low Blood "

## 2020-09-20 ENCOUNTER — Other Ambulatory Visit: Payer: Self-pay

## 2020-09-20 ENCOUNTER — Inpatient Hospital Stay (HOSPITAL_COMMUNITY): Payer: Managed Care, Other (non HMO)

## 2020-09-20 DIAGNOSIS — C8300 Small cell B-cell lymphoma, unspecified site: Secondary | ICD-10-CM | POA: Diagnosis not present

## 2020-09-20 MED ORDER — SODIUM CHLORIDE 0.9 % IV SOLN
300.0000 mg | Freq: Once | INTRAVENOUS | Status: AC
  Administered 2020-09-20: 300 mg via INTRAVENOUS
  Filled 2020-09-20: qty 15

## 2020-09-20 MED ORDER — SODIUM CHLORIDE 0.9 % IV SOLN: INTRAVENOUS | Status: DC

## 2020-09-20 NOTE — Progress Notes (Signed)
Tolerated infusion and remained stable while receiving and w/o adverse reaction.  Alert, in no distress.  VSS.  Discharged ambulatory in stable condition.

## 2020-09-20 NOTE — Progress Notes (Signed)
Well -that can explain his symptoms  Glenetta Hew, MD

## 2020-09-21 LAB — COPPER, SERUM: Copper: 115 ug/dL (ref 69–132)

## 2020-09-22 ENCOUNTER — Other Ambulatory Visit (HOSPITAL_COMMUNITY): Payer: Managed Care, Other (non HMO)

## 2020-09-22 DIAGNOSIS — C8300 Small cell B-cell lymphoma, unspecified site: Secondary | ICD-10-CM | POA: Diagnosis not present

## 2020-09-22 DIAGNOSIS — D509 Iron deficiency anemia, unspecified: Secondary | ICD-10-CM | POA: Insufficient documentation

## 2020-09-22 LAB — OCCULT BLOOD X 1 CARD TO LAB, STOOL
Fecal Occult Bld: POSITIVE — AB
Fecal Occult Bld: POSITIVE — AB
Fecal Occult Bld: POSITIVE — AB

## 2020-09-22 LAB — METHYLMALONIC ACID, SERUM: Methylmalonic Acid, Quantitative: 230 nmol/L (ref 0–378)

## 2020-09-22 NOTE — Progress Notes (Signed)
Intravenous Iron Formulation Change  Adam Reyes has insurance that requires a change in intravenous iron product from Feraheme to Venofer. Orders have been updated to reflect this change and scheduling message sent to adjust infusion appointments. Dr Delton Coombes notified and agrees with the plan.  Allergies: No Known Allergies  The plan for iron therapy is as follows: Venofer 300 mg IVPB x 3 doses, may give closer than every 7 days per Dr Delton Coombes.  Adam Reyes, PharmD 09/22/2020 @ 1100

## 2020-09-25 ENCOUNTER — Encounter (HOSPITAL_COMMUNITY): Payer: Self-pay

## 2020-09-25 ENCOUNTER — Other Ambulatory Visit (HOSPITAL_COMMUNITY): Payer: Self-pay

## 2020-09-25 ENCOUNTER — Inpatient Hospital Stay (HOSPITAL_COMMUNITY): Payer: Managed Care, Other (non HMO)

## 2020-09-25 ENCOUNTER — Encounter (INDEPENDENT_AMBULATORY_CARE_PROVIDER_SITE_OTHER): Payer: Self-pay | Admitting: *Deleted

## 2020-09-25 ENCOUNTER — Other Ambulatory Visit: Payer: Self-pay

## 2020-09-25 VITALS — BP 104/52 | HR 71 | Temp 97.7°F | Resp 17

## 2020-09-25 DIAGNOSIS — D649 Anemia, unspecified: Secondary | ICD-10-CM

## 2020-09-25 DIAGNOSIS — D509 Iron deficiency anemia, unspecified: Secondary | ICD-10-CM

## 2020-09-25 DIAGNOSIS — C8300 Small cell B-cell lymphoma, unspecified site: Secondary | ICD-10-CM

## 2020-09-25 MED ORDER — SODIUM CHLORIDE 0.9 % IV SOLN: Freq: Once | INTRAVENOUS | Status: AC

## 2020-09-25 MED ORDER — SODIUM CHLORIDE 0.9 % IV SOLN
300.0000 mg | Freq: Once | INTRAVENOUS | Status: AC
  Administered 2020-09-25: 300 mg via INTRAVENOUS
  Filled 2020-09-25: qty 15

## 2020-09-25 NOTE — Progress Notes (Signed)
Patient presents today for 300mg  Venofer infusion. MAR reviewed and updated. Vital signs stable. Patient has no complaints of any changes since his last visit. Patient denies any pain today. Patient states, " I can't tell any difference since receiving this iron."   Venofer given today per MD orders. Tolerated infusion without adverse affects. Vital signs stable. No complaints at this time. Discharged from clinic ambulatory in stable condition. Alert and oriented x 3. F/U with [facility] as scheduled.

## 2020-09-25 NOTE — Patient Instructions (Signed)
[facility]  Discharge Instructions:   _______________________________________________________________  Thank you for choosing [facility] to provide your oncology and hematology care.  To afford each patient quality time with our providers, please arrive at least 15 minutes before your scheduled appointment.  You need to re-schedule your appointment if you arrive 10 or more minutes late.  We strive to give you quality time with our providers, and arriving late affects you and other patients whose appointments are after yours.  Also, if you no show three or more times for appointments you may be dismissed from the clinic.  Again, thank you for choosing [facility] at  Hospital. Our hope is that these requests will allow you access to exceptional care and in a timely manner. _______________________________________________________________  If you have questions after your visit, please contact our office at [phone] between the hours of 8:30 a.m. and 5:00 p.m. Voicemails left after 4:30 p.m. will not be returned until the following business day. _______________________________________________________________  For prescription refill requests, have your pharmacy contact our office. _______________________________________________________________  Recommendations made by the consultant and any test results will be sent to your referring physician. _______________________________________________________________ 

## 2020-09-26 ENCOUNTER — Inpatient Hospital Stay (HOSPITAL_COMMUNITY): Admission: RE | Admit: 2020-09-26 | Payer: Managed Care, Other (non HMO) | Source: Ambulatory Visit

## 2020-09-28 ENCOUNTER — Encounter (INDEPENDENT_AMBULATORY_CARE_PROVIDER_SITE_OTHER): Payer: Self-pay

## 2020-09-28 ENCOUNTER — Encounter (HOSPITAL_COMMUNITY): Payer: Self-pay

## 2020-09-29 ENCOUNTER — Other Ambulatory Visit: Payer: Self-pay

## 2020-09-29 ENCOUNTER — Inpatient Hospital Stay (HOSPITAL_COMMUNITY): Payer: Managed Care, Other (non HMO)

## 2020-09-29 ENCOUNTER — Encounter (HOSPITAL_COMMUNITY): Payer: Self-pay

## 2020-09-29 VITALS — BP 129/70 | HR 71 | Temp 97.2°F | Resp 18 | Wt 259.4 lb

## 2020-09-29 DIAGNOSIS — C8300 Small cell B-cell lymphoma, unspecified site: Secondary | ICD-10-CM | POA: Diagnosis not present

## 2020-09-29 DIAGNOSIS — D509 Iron deficiency anemia, unspecified: Secondary | ICD-10-CM

## 2020-09-29 DIAGNOSIS — D649 Anemia, unspecified: Secondary | ICD-10-CM

## 2020-09-29 LAB — CBC WITH DIFFERENTIAL/PLATELET
Abs Immature Granulocytes: 0.06 10*3/uL (ref 0.00–0.07)
Basophils Absolute: 0 10*3/uL (ref 0.0–0.1)
Basophils Relative: 0 %
Eosinophils Absolute: 0 10*3/uL (ref 0.0–0.5)
Eosinophils Relative: 1 %
HCT: 32.3 % — ABNORMAL LOW (ref 39.0–52.0)
Hemoglobin: 9 g/dL — ABNORMAL LOW (ref 13.0–17.0)
Immature Granulocytes: 1 %
Lymphocytes Relative: 8 %
Lymphs Abs: 0.5 10*3/uL — ABNORMAL LOW (ref 0.7–4.0)
MCH: 24.9 pg — ABNORMAL LOW (ref 26.0–34.0)
MCHC: 27.9 g/dL — ABNORMAL LOW (ref 30.0–36.0)
MCV: 89.5 fL (ref 80.0–100.0)
Monocytes Absolute: 0.5 10*3/uL (ref 0.1–1.0)
Monocytes Relative: 9 %
Neutro Abs: 4.8 10*3/uL (ref 1.7–7.7)
Neutrophils Relative %: 81 %
Platelets: 218 10*3/uL (ref 150–400)
RBC: 3.61 MIL/uL — ABNORMAL LOW (ref 4.22–5.81)
RDW: 26 % — ABNORMAL HIGH (ref 11.5–15.5)
WBC: 5.9 10*3/uL (ref 4.0–10.5)
nRBC: 0 % (ref 0.0–0.2)

## 2020-09-29 MED ORDER — STERILE WATER FOR INJECTION IJ SOLN
INTRAMUSCULAR | Status: AC
  Filled 2020-09-29: qty 10

## 2020-09-29 MED ORDER — ALTEPLASE 2 MG IJ SOLR
INTRAMUSCULAR | Status: AC
  Filled 2020-09-29: qty 2

## 2020-09-29 MED ORDER — SODIUM CHLORIDE 0.9 % IV SOLN
400.0000 mg | Freq: Once | INTRAVENOUS | Status: AC
  Administered 2020-09-29: 400 mg via INTRAVENOUS
  Filled 2020-09-29: qty 20

## 2020-09-29 MED ORDER — SODIUM CHLORIDE 0.9 % IV SOLN: Freq: Once | INTRAVENOUS | Status: AC

## 2020-09-29 NOTE — Patient Instructions (Signed)
[facility] at [facility]  Discharge Instructions:   _______________________________________________________________  Thank you for choosing [facility] at [facility] to provide your oncology and hematology care.  To afford each patient quality time with our providers, please arrive at least 15 minutes before your scheduled appointment.  You need to re-schedule your appointment if you arrive 10 or more minutes late.  We strive to give you quality time with our providers, and arriving late affects you and other patients whose appointments are after yours.  Also, if you no show three or more times for appointments you may be dismissed from the clinic.  Again, thank you for choosing [facility] at [facility]. Our hope is that these requests will allow you access to exceptional care and in a timely manner. _______________________________________________________________  If you have questions after your visit, please contact our office at [phone] between the hours of 8:30 a.m. and 5:00 p.m. Voicemails left after 4:30 p.m. will not be returned until the following business day. _______________________________________________________________  For prescription refill requests, have your pharmacy contact our office. _______________________________________________________________  Recommendations made by the consultant and any test results will be sent to your referring physician. _______________________________________________________________ 

## 2020-09-29 NOTE — Progress Notes (Signed)
Patient tolerated iron infusion with no complaints voiced.  Peripheral IV site clean and dry with good blood return noted before and after infusion.  Band aid applied.  VSS with discharge and left in satisfactory condition with no s/s of distress noted.   

## 2020-10-02 ENCOUNTER — Telehealth (INDEPENDENT_AMBULATORY_CARE_PROVIDER_SITE_OTHER): Payer: Self-pay

## 2020-10-02 ENCOUNTER — Ambulatory Visit (INDEPENDENT_AMBULATORY_CARE_PROVIDER_SITE_OTHER): Payer: Managed Care, Other (non HMO) | Admitting: Gastroenterology

## 2020-10-02 ENCOUNTER — Other Ambulatory Visit: Payer: Self-pay

## 2020-10-02 ENCOUNTER — Encounter (INDEPENDENT_AMBULATORY_CARE_PROVIDER_SITE_OTHER): Payer: Self-pay

## 2020-10-02 ENCOUNTER — Encounter (INDEPENDENT_AMBULATORY_CARE_PROVIDER_SITE_OTHER): Payer: Self-pay | Admitting: Gastroenterology

## 2020-10-02 ENCOUNTER — Other Ambulatory Visit (INDEPENDENT_AMBULATORY_CARE_PROVIDER_SITE_OTHER): Payer: Self-pay

## 2020-10-02 VITALS — BP 165/81 | HR 83 | Temp 98.2°F | Ht 76.0 in | Wt 264.0 lb

## 2020-10-02 DIAGNOSIS — D509 Iron deficiency anemia, unspecified: Secondary | ICD-10-CM

## 2020-10-02 DIAGNOSIS — Z86010 Personal history of colonic polyps: Secondary | ICD-10-CM | POA: Diagnosis not present

## 2020-10-02 DIAGNOSIS — Z1211 Encounter for screening for malignant neoplasm of colon: Secondary | ICD-10-CM

## 2020-10-02 MED ORDER — PEG 3350-KCL-NA BICARB-NACL 420 G PO SOLR: 4000.0000 mL | ORAL | 0 refills | Status: DC

## 2020-10-02 NOTE — Patient Instructions (Signed)
Schedule EGD and colonoscopy

## 2020-10-02 NOTE — Progress Notes (Signed)
Adam Reyes, M.D. Gastroenterology & Hepatology [facility] [address] [place], [place] [address] Primary Care Physician: [name], MD [place] [address]  Referring MD: Derek Jack, MD  Chief Complaint: Iron deficiency anemia  History of Present Illness: Adam Reyes is a 65 y.o. 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  The patient was referred to clinic of 2 presenting new onset anemia.  He had a hemoglobin of 7.1 and MCV of 81 on 09/13/2020 which was a major drop from his baseline of 15.5 in October 2021.  Other labs from March showed platelets of 222 and 1 also count of 8.0, CMP showed normal liver enzymes with BUN of 16 and creatinine 1.03, normal electrolytes.  It is currently following his hematologist/oncologist Dr. Delton Coombes, who checked his iron stores on 09/19/2020 which showed iron of 18, ferritin of 4 and iron saturation of 4% with TIBC of 440.  Due to this he was started on iron supplementation. Has received 3 iron nfusions. Last IV iron dose was on Friday. Most recent Hb was 9.0 on 3/18 before his third dose. He is also on oral iron supplementation.  States that 3-4 months ago he noticed some dark stools (dark green color) but no melena. He had also presented some lightheadedness 3 months ago, which was becoming a bigger concern with time. After he received the IV iron he feels better from the lightheadedness perspective. The patient denies having any nausea, vomiting, fever, chills, hematochezia, melena, hematemesis, abdominal distention, abdominal pain, diarrhea, jaundice, pruritus or weight loss.  He takes ibuprofen or Aleve once a month or even less.  Last JZP:HXTAV Last Colonoscopy: 10/11/2015, patient had a 4 mm polyp in the cecum, a 12 mm polyp in the cecum (removed in a piecemeal fashion) had 2 hemostatic clips  placed, 4 mm polyp in the ascending colon (1 clip was placed upon resection), a 10 mm polyp in the ascending colon (1 clip was placed after resection), a 10 mm polyp in the splenic flexure (1 clip placed), a 14 mm polyp in the splenic flexure (1 clip was placed), diverticulosis and internal hemorrhoids.  All polyps except 1 were tubular adenomas, there was only one sessile serrated lesion in the splenic flexure.  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  No PCI.  Preserved EF    Past Surgical History: Past Surgical History:  Procedure Laterality Date   COLONOSCOPY N/A 10/11/2015   Procedure: COLONOSCOPY;  Surgeon: Rogene Houston, MD;  [facility];  Service: Endoscopy;  Laterality: N/A;  10/11/2015   Graded Exercise Tolerance Test (GXT/ETT)  05/2017   10.7 METs (9: 25 min.  Reached 103% max predicted heart rate).  No EKG findings to suggest coronary ischemia.  Negative, low risk GXT   LEFT HEART CATH AND CORONARY ANGIOGRAPHY N/A  03/05/2017   Procedure: LEFT HEART CATH AND CORONARY ANGIOGRAPHY;  Surgeon: Leonie Man, MD;  Location: [facility];  Service: Cardiovascular: pLAD-Diag1 50-55% (non-flow-limiiting).  EF ~50-55%.  although bifurcation lesion was presumed Culprit - no PCI (not flow limiting). - Med Rx.   MASS BIOPSY Left 06/27/2015   Procedure: OPEN LEFT NECK BIOPSY ;  Surgeon: Leta Baptist, MD;  Location: [facility];  Service: ENT;  Laterality: Left;   REFRACTIVE SURGERY Bilateral     Family History: Family History  Problem Relation Age of Onset   Diabetes Mother    Cancer Father    Cancer Brother     Social History: Social History   Tobacco Use  Smoking Status Former Smoker   Years: 2.00   Types: Cigarettes  Smokeless Tobacco Former Systems developer   Types: Chew, Snuff  Tobacco Comment   03/06/2017 "quit smoking when I was young; quit chew/snuff in ~ 2008"   Social History   Substance and Sexual Activity  Alcohol Use Yes   Alcohol/week: 2.0 standard drinks   Types: 2 Cans of beer per week   Social History   Substance and Sexual Activity  Drug Use No    Allergies: No Known Allergies  Medications: Current Outpatient Medications  Medication Sig Dispense Refill   aspirin 81 MG chewable tablet Chew 1 tablet (81 mg total) by mouth every other day. 30 tablet 11   atorvastatin (LIPITOR) 40 MG tablet TAKE 1 TABLET(40 MG) BY MOUTH DAILY 90 tablet 3   ferrous sulfate 324 (65 Fe) MG TBEC Take by mouth. Once per day.     Ibrutinib (IMBRUVICA) 420 MG TABS TAKE 1 TABLET (420 MG) BY MOUTH DAILY. 28 tablet 3   metoprolol tartrate (LOPRESSOR) 25 MG tablet TAKE 1/2 TABLET(12.5 MG) BY MOUTH TWICE DAILY 90 tablet 3   naproxen sodium (ANAPROX) 550 MG tablet TK 1 T PO Q 12 H PRN  0   nitroGLYCERIN (NITROSTAT) 0.4 MG SL tablet Place 1 tablet (0.4 mg total) under the tongue every 5 (five) minutes as needed for chest pain. 25 tablet 6   No current facility-administered medications for this visit.    Review of Systems: GENERAL: negative for malaise, night sweats HEENT: No changes in hearing or vision, no nose bleeds or other nasal problems. NECK: Negative for lumps, goiter, pain and significant neck swelling RESPIRATORY: Negative for cough, wheezing CARDIOVASCULAR: Negative for chest pain, leg swelling, palpitations, orthopnea GI: SEE  HPI MUSCULOSKELETAL: Negative for joint pain or swelling, back pain, and muscle pain. SKIN: Negative for lesions, rash PSYCH: Negative for sleep disturbance, mood disorder and recent psychosocial stressors. HEMATOLOGY Negative for prolonged bleeding, bruising easily, and swollen nodes. ENDOCRINE: Negative for cold or heat intolerance, polyuria, polydipsia and goiter. NEURO: negative for tremor, gait imbalance, syncope and seizures. The remainder of the review of systems is noncontributory.   Physical Exam: BP (!) 165/81 (BP Location: Left Arm, Patient Position: Sitting, Cuff Size: Large)    Pulse 83    Temp 98.2 F (36.8 C) (Oral)    Ht 6\' 4"  (1.93 m)    Wt 264 lb (119.7 kg)    BMI 32.14 kg/m  GENERAL: The patient is AO x3, in no acute distress. HEENT: Head is normocephalic and atraumatic. EOMI are intact. Mouth is well hydrated and without lesions. NECK: Supple. No masses LUNGS: Clear to auscultation. No presence of rhonchi/wheezing/rales. Adequate chest expansion HEART: RRR, normal s1 and s2. ABDOMEN: Soft, nontender, no guarding, no  peritoneal signs, and nondistended. BS +. No masses. EXTREMITIES: Without any cyanosis, clubbing, rash, lesions or edema. NEUROLOGIC: AOx3, no focal motor deficit. SKIN: no jaundice, no rashes   Imaging/Labs: as above  I personally reviewed and interpreted the available labs, imaging and endoscopic files.  Impression and Plan: ESDRAS DELAIR is a 65 y.o. 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  The patient had evidence of major drop in his hemoglobin without any clinical sign of overt gastrointestinal bleeding.  However, given the presence of severely low iron stores, there is concern for slow gastrointestinal losses.  He had previous large colonic polyps that were removed in 2017, he is due for colorectal cancer screening at this point.  We will schedule him for both an EGD and a  colonoscopy for further evaluation.  If this investigations are unremarkable, will need to proceed with a capsule endoscopy.  - Schedule EGD and colonoscopy - Continue iron supplementation per hematology rrecs.  All questions were answered.      Adam Peppers, MD Gastroenterology and Hepatology [facility]

## 2020-10-02 NOTE — Telephone Encounter (Signed)
Adam Reyes, [place]

## 2020-10-02 NOTE — H&P (View-Only) (Signed)
Adam Reyes, M.D. Gastroenterology & Hepatology [facility] [address] [place], [place] [address] Primary Care Physician: [name], MD [place] [address]  Referring MD: Derek Jack, MD  Chief Complaint: Iron deficiency anemia  History of Present Illness: HALEEM HANNER is a 65 y.o. 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  The patient was referred to clinic of 2 presenting new onset anemia.  He had a hemoglobin of 7.1 and MCV of 81 on 09/13/2020 which was a major drop from his baseline of 15.5 in October 2021.  Other labs from March showed platelets of 222 and 1 also count of 8.0, CMP showed normal liver enzymes with BUN of 16 and creatinine 1.03, normal electrolytes.  It is currently following his hematologist/oncologist Dr. Delton Coombes, who checked his iron stores on 09/19/2020 which showed iron of 18, ferritin of 4 and iron saturation of 4% with TIBC of 440.  Due to this he was started on iron supplementation. Has received 3 iron nfusions. Last IV iron dose was on Friday. Most recent Hb was 9.0 on 3/18 before his third dose. He is also on oral iron supplementation.  States that 3-4 months ago he noticed some dark stools (dark green color) but no melena. He had also presented some lightheadedness 3 months ago, which was becoming a bigger concern with time. After he received the IV iron he feels better from the lightheadedness perspective. The patient denies having any nausea, vomiting, fever, chills, hematochezia, melena, hematemesis, abdominal distention, abdominal pain, diarrhea, jaundice, pruritus or weight loss.  He takes ibuprofen or Aleve once a month or even less.  Last PYK:DXIPJ Last Colonoscopy: 10/11/2015, patient had a 4 mm polyp in the cecum, a 12 mm polyp in the cecum (removed in a piecemeal fashion) had 2 hemostatic clips  placed, 4 mm polyp in the ascending colon (1 clip was placed upon resection), a 10 mm polyp in the ascending colon (1 clip was placed after resection), a 10 mm polyp in the splenic flexure (1 clip placed), a 14 mm polyp in the splenic flexure (1 clip was placed), diverticulosis and internal hemorrhoids.  All polyps except 1 were tubular adenomas, there was only one sessile serrated lesion in the splenic flexure.  FHx: neg for any gastrointestinal/liver disease, father colon cancer at age 33, brother esophageal cancer Social: neg smoking, alcohol or illicit drug use Surgical: no abdominal surgeries  Past Medical History: Past Medical History:  Diagnosis Date  . 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 Enlarged lymph node    left neck  . STEMI (ST elevation myocardial infarction) ([place]) 03/05/2017   hx/notes 03/05/2017 -likely aborted anterior STEMI with 50% bifurcation LAD-Diag1.  No PCI.  Preserved EF    Past Surgical History: Past Surgical History:  Procedure Laterality Date  . COLONOSCOPY N/A 10/11/2015   Procedure: COLONOSCOPY;  Surgeon: Rogene Houston, MD;  [facility];  Service: Endoscopy;  Laterality: N/A;  10/11/2015  . Graded Exercise Tolerance Test (GXT/ETT)  05/2017   10.7 METs (9: 25 min.  Reached 103% max predicted heart rate).  No EKG findings to suggest coronary ischemia.  Negative, low risk GXT  . LEFT HEART CATH AND CORONARY ANGIOGRAPHY N/A  03/05/2017   Procedure: LEFT HEART CATH AND CORONARY ANGIOGRAPHY;  Surgeon: Leonie Man, MD;  Location: [place] [facility];  Service: Cardiovascular: pLAD-Diag1 50-55% (non-flow-limiiting).  EF ~50-55%.  although bifurcation lesion was presumed Culprit - no PCI (not flow limiting). - Med Rx.  [name] MASS BIOPSY Left 06/27/2015   Procedure: OPEN LEFT NECK BIOPSY ;  Surgeon: Leta Baptist, MD;  Location: [facility];  Service: ENT;  Laterality: Left;  . REFRACTIVE SURGERY Bilateral     Family History: Family History  Problem Relation Age of Onset  . Diabetes Mother   . Cancer Father   . Cancer Brother     Social History: Social History   Tobacco Use  Smoking Status Former Smoker  . Years: 2.00  . Types: Cigarettes  Smokeless Tobacco Former Systems developer  . Types: Chew, Snuff  Tobacco Comment   03/06/2017 "quit smoking when I was young; quit chew/snuff in ~ 2008"   Social History   Substance and Sexual Activity  Alcohol Use Yes  . Alcohol/week: 2.0 standard drinks  . Types: 2 Cans of beer per week   Social History   Substance and Sexual Activity  Drug Use No    Allergies: No Known Allergies  Medications: Current Outpatient Medications  Medication Sig Dispense Refill  . aspirin 81 MG chewable tablet Chew 1 tablet (81 mg total) by mouth every other day. 30 tablet 11  . atorvastatin (LIPITOR) 40 MG tablet TAKE 1 TABLET(40 MG) BY MOUTH DAILY 90 tablet 3  . ferrous sulfate 324 (65 Fe) MG TBEC Take by mouth. Once per day.    . Ibrutinib (IMBRUVICA) 420 MG TABS TAKE 1 TABLET (420 MG) BY MOUTH DAILY. 28 tablet 3  . metoprolol tartrate (LOPRESSOR) 25 MG tablet TAKE 1/2 TABLET(12.5 MG) BY MOUTH TWICE DAILY 90 tablet 3  . naproxen sodium (ANAPROX) 550 MG tablet TK 1 T PO Q 12 H PRN  0  . nitroGLYCERIN (NITROSTAT) 0.4 MG SL tablet Place 1 tablet (0.4 mg total) under the tongue every 5 (five) minutes as needed for chest pain. 25 tablet 6   No current facility-administered medications for this visit.    Review of Systems: GENERAL: negative for malaise, night sweats HEENT: No changes in hearing or vision, no nose bleeds or other nasal problems. NECK: Negative for lumps, goiter, pain and significant neck swelling RESPIRATORY: Negative for cough, wheezing CARDIOVASCULAR: Negative for chest pain, leg swelling, palpitations, orthopnea GI: SEE  HPI MUSCULOSKELETAL: Negative for joint pain or swelling, back pain, and muscle pain. SKIN: Negative for lesions, rash PSYCH: Negative for sleep disturbance, mood disorder and recent psychosocial stressors. HEMATOLOGY Negative for prolonged bleeding, bruising easily, and swollen nodes. ENDOCRINE: Negative for cold or heat intolerance, polyuria, polydipsia and goiter. NEURO: negative for tremor, gait imbalance, syncope and seizures. The remainder of the review of systems is noncontributory.   Physical Exam: BP (!) 165/81 (BP Location: Left Arm, Patient Position: Sitting, Cuff Size: Large)   Pulse 83   Temp 98.2 F (36.8 C) (Oral)   Ht 6\' 4"  (1.93 m)   Wt 264 lb (119.7 kg)   BMI 32.14 kg/m  GENERAL: The patient is AO x3, in no acute distress. HEENT: Head is normocephalic and atraumatic. EOMI are intact. Mouth is well hydrated and without lesions. NECK: Supple. No masses LUNGS: Clear to auscultation. No presence of rhonchi/wheezing/rales. Adequate chest expansion HEART: RRR, normal s1 and s2. ABDOMEN: Soft, nontender, no guarding, no peritoneal signs, and nondistended. BS +.  No masses. EXTREMITIES: Without any cyanosis, clubbing, rash, lesions or edema. NEUROLOGIC: AOx3, no focal motor deficit. SKIN: no jaundice, no rashes   Imaging/Labs: as above  I personally reviewed and interpreted the available labs, imaging and endoscopic files.  Impression and Plan: LATRON RIBAS is a 65 y.o. 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  The patient had evidence of major drop in his hemoglobin without any clinical sign of overt gastrointestinal bleeding.  However, given the presence of severely low iron stores, there is concern for slow gastrointestinal losses.  He had previous large colonic polyps that were removed in 2017, he is due for colorectal cancer screening at this point.  We will schedule him for both an EGD and a  colonoscopy for further evaluation.  If this investigations are unremarkable, will need to proceed with a capsule endoscopy.  - Schedule EGD and colonoscopy - Continue iron supplementation per hematology rrecs.  All questions were answered.      Adam Peppers, MD Gastroenterology and Hepatology [facility]

## 2020-10-03 ENCOUNTER — Ambulatory Visit (INDEPENDENT_AMBULATORY_CARE_PROVIDER_SITE_OTHER): Payer: Managed Care, Other (non HMO) | Admitting: Gastroenterology

## 2020-10-03 ENCOUNTER — Other Ambulatory Visit (HOSPITAL_COMMUNITY): Payer: Managed Care, Other (non HMO)

## 2020-10-04 ENCOUNTER — Ambulatory Visit (INDEPENDENT_AMBULATORY_CARE_PROVIDER_SITE_OTHER): Payer: Managed Care, Other (non HMO) | Admitting: Internal Medicine

## 2020-10-04 ENCOUNTER — Encounter (INDEPENDENT_AMBULATORY_CARE_PROVIDER_SITE_OTHER): Payer: Self-pay | Admitting: Internal Medicine

## 2020-10-04 ENCOUNTER — Other Ambulatory Visit: Payer: Self-pay

## 2020-10-04 VITALS — BP 138/68 | HR 78 | Temp 97.5°F | Ht 76.0 in | Wt 263.4 lb

## 2020-10-04 DIAGNOSIS — D509 Iron deficiency anemia, unspecified: Secondary | ICD-10-CM

## 2020-10-04 DIAGNOSIS — C8300 Small cell B-cell lymphoma, unspecified site: Secondary | ICD-10-CM

## 2020-10-04 DIAGNOSIS — E559 Vitamin D deficiency, unspecified: Secondary | ICD-10-CM

## 2020-10-04 NOTE — Progress Notes (Signed)
Metrics: Intervention Frequency ACO  Documented Smoking Status Yearly  Screened one or more times in 24 months  Cessation Counseling or  Active cessation medication Past 24 months  Past 24 months   Guideline developer: UpToDate (See UpToDate for funding source) Date Released: 2014       Wellness Office Visit  Subjective:  Patient ID: Adam Reyes, male    DOB: [birth year]  Age: 65 y.o. MRN: [id number]  CC: This man comes in for follow-up regarding his dyspnea. HPI  His oncologist checked his CBC and he had significantly decreased hemoglobin down to 7.1.  The source of this is not clear.  He does have blood in his stool and is going to have colonoscopy and EGD next week.  He has received iron infusions and his hemoglobin has come up to just above 9.  He does feel better.  He was also seen by cardiology but investigations have been canceled regarding stress testing in view of his severe anemia. Past Medical History:  Diagnosis Date  . 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 Enlarged lymph node    left neck  . STEMI (ST elevation myocardial infarction) ([place]) 03/05/2017   hx/notes 03/05/2017 -likely aborted anterior STEMI with 50% bifurcation LAD-Diag1.  No PCI.  Preserved EF   Past Surgical History:  Procedure Laterality Date  . COLONOSCOPY N/A 10/11/2015   Procedure: COLONOSCOPY;  Surgeon: Rogene Houston, MD;  [facility];  Service: Endoscopy;  Laterality: N/A;  10/11/2015  . Graded Exercise Tolerance Test (GXT/ETT)  05/2017   10.7 METs (9: 25 min.  Reached 103% max predicted heart rate).  No EKG findings to suggest coronary ischemia.  Negative, low risk GXT  . LEFT HEART CATH AND CORONARY ANGIOGRAPHY N/A 03/05/2017   Procedure: LEFT HEART CATH AND CORONARY ANGIOGRAPHY;  Surgeon: Leonie Man, MD;  Location: [place]  [facility];  Service: Cardiovascular: pLAD-Diag1 50-55% (non-flow-limiiting).  EF ~50-55%.  although bifurcation lesion was presumed Culprit - no PCI (not flow limiting). - Med Rx.  [name] MASS BIOPSY Left 06/27/2015   Procedure: OPEN LEFT NECK BIOPSY ;  Surgeon: Leta Baptist, MD;  Location: [place];  Service: ENT;  Laterality: Left;  . REFRACTIVE SURGERY Bilateral      Family History  Problem Relation Age of Onset  . Diabetes Mother   . Cancer Father   . Cancer Brother     Social History   Social History Narrative   Married since 2001,third.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   Social History   Tobacco Use  . Smoking status: Former Smoker    Years: 2.00    Types: Cigarettes  . Smokeless tobacco: Former Systems developer    Types: Chew, Snuff  . Tobacco comment: 03/06/2017 "quit smoking when I was young; quit chew/snuff in ~ 2008"  Substance Use Topics  . Alcohol use: Yes    Alcohol/week: 2.0 standard drinks    Types: 2 Cans of beer per week    Current Meds  Medication Sig  . aspirin 81 MG chewable tablet Chew 1 tablet (81 mg total) by mouth every other day.  [name] atorvastatin (LIPITOR) 40 MG tablet TAKE 1 TABLET(40 MG) BY MOUTH DAILY (Patient taking differently: Take 40 mg by mouth daily.)  . ferrous sulfate 324 (65 Fe) MG TBEC Take 36 mg by  mouth daily.  . Ibrutinib (IMBRUVICA) 420 MG TABS TAKE 1 TABLET (420 MG) BY MOUTH DAILY. (Patient taking differently: Take 420 mg by mouth daily.)  . metoprolol tartrate (LOPRESSOR) 25 MG tablet TAKE 1/2 TABLET(12.5 MG) BY MOUTH TWICE DAILY (Patient taking differently: Take 12.5 mg by mouth 2 (two) times daily.)  . nitroGLYCERIN (NITROSTAT) 0.4 MG SL tablet Place 1 tablet (0.4 mg total) under the tongue every 5 (five) minutes as needed for chest pain.  . polyethylene glycol-electrolytes (TRILYTE) 420 g solution Take 4,000 mLs by mouth as directed.       Objective:   Today's Vitals: BP 138/68   Pulse 78   Temp (!) 97.5 F (36.4 C)  (Temporal)   Ht 6\' 4"  (1.93 m)   Wt 263 lb 6.4 oz (119.5 kg)   SpO2 96%   BMI 32.06 kg/m  Vitals with BMI 10/04/2020 10/02/2020 09/29/2020  Height 6\' 4"  6\' 4"  -  Weight 263 lbs 6 oz 264 lbs -  BMI 22.29 79.89 -  Systolic 211 941 740  Diastolic 68 81 70  Pulse 78 83 71     Physical Exam He looks systemically well and feels better.  Blood pressure is improved.      Assessment   1. Lymphoma, small lymphocytic ([place])   2. Iron deficiency anemia, unspecified iron deficiency anemia type   3. Vitamin D deficiency disease       Tests ordered No orders of the defined types were placed in this encounter.    Plan: 1. He will continue investigations regarding his iron deficiency anemia with colonoscopy and EGD. 2. I have recommended that he take vitamin D3 10,000 units daily which was also an issue. 3. Follow-up with Judson Roch in about 3 months and we need to check his vitamin D levels again then.   No orders of the defined types were placed in this encounter.   [name], MD

## 2020-10-04 NOTE — Patient Instructions (Signed)
VITAMIN D3 10,000 UNITS/DAY

## 2020-10-06 ENCOUNTER — Other Ambulatory Visit (HOSPITAL_COMMUNITY): Payer: Self-pay

## 2020-10-09 ENCOUNTER — Other Ambulatory Visit (HOSPITAL_COMMUNITY): Payer: Self-pay | Admitting: Hematology

## 2020-10-09 ENCOUNTER — Other Ambulatory Visit: Payer: Self-pay

## 2020-10-09 ENCOUNTER — Other Ambulatory Visit (HOSPITAL_COMMUNITY)
Admission: RE | Admit: 2020-10-09 | Discharge: 2020-10-09 | Disposition: A | Discharge status: Home / Self Care | Payer: Managed Care, Other (non HMO) | Source: Ambulatory Visit | Attending: Gastroenterology | Admitting: Gastroenterology

## 2020-10-09 DIAGNOSIS — Z20822 Contact with and (suspected) exposure to covid-19: Secondary | ICD-10-CM | POA: Insufficient documentation

## 2020-10-09 DIAGNOSIS — Z01812 Encounter for preprocedural laboratory examination: Secondary | ICD-10-CM | POA: Insufficient documentation

## 2020-10-09 DIAGNOSIS — C8300 Small cell B-cell lymphoma, unspecified site: Secondary | ICD-10-CM

## 2020-10-10 ENCOUNTER — Encounter (HOSPITAL_COMMUNITY): Payer: Self-pay | Admitting: Gastroenterology

## 2020-10-10 ENCOUNTER — Other Ambulatory Visit (HOSPITAL_COMMUNITY): Payer: Self-pay | Admitting: Hematology

## 2020-10-10 ENCOUNTER — Telehealth (INDEPENDENT_AMBULATORY_CARE_PROVIDER_SITE_OTHER): Payer: Self-pay | Admitting: *Deleted

## 2020-10-10 ENCOUNTER — Ambulatory Visit (HOSPITAL_COMMUNITY): Payer: Managed Care, Other (non HMO) | Admitting: Anesthesiology

## 2020-10-10 ENCOUNTER — Ambulatory Visit (HOSPITAL_COMMUNITY)
Admission: RE | Admit: 2020-10-10 | Discharge: 2020-10-10 | Disposition: A | Discharge status: Home / Self Care | Payer: Managed Care, Other (non HMO) | Attending: Gastroenterology | Admitting: Gastroenterology

## 2020-10-10 ENCOUNTER — Other Ambulatory Visit: Payer: Self-pay

## 2020-10-10 ENCOUNTER — Encounter (HOSPITAL_COMMUNITY)
Admission: RE | Disposition: A | Discharge status: Home / Self Care | Payer: Self-pay | Source: Home / Self Care | Attending: Gastroenterology

## 2020-10-10 DIAGNOSIS — D123 Benign neoplasm of transverse colon: Secondary | ICD-10-CM | POA: Diagnosis not present

## 2020-10-10 DIAGNOSIS — Z809 Family history of malignant neoplasm, unspecified: Secondary | ICD-10-CM | POA: Diagnosis not present

## 2020-10-10 DIAGNOSIS — K259 Gastric ulcer, unspecified as acute or chronic, without hemorrhage or perforation: Secondary | ICD-10-CM | POA: Diagnosis not present

## 2020-10-10 DIAGNOSIS — Z7982 Long term (current) use of aspirin: Secondary | ICD-10-CM | POA: Insufficient documentation

## 2020-10-10 DIAGNOSIS — K5730 Diverticulosis of large intestine without perforation or abscess without bleeding: Secondary | ICD-10-CM | POA: Insufficient documentation

## 2020-10-10 DIAGNOSIS — I2510 Atherosclerotic heart disease of native coronary artery without angina pectoris: Secondary | ICD-10-CM | POA: Diagnosis not present

## 2020-10-10 DIAGNOSIS — D509 Iron deficiency anemia, unspecified: Secondary | ICD-10-CM | POA: Diagnosis not present

## 2020-10-10 DIAGNOSIS — Z79899 Other long term (current) drug therapy: Secondary | ICD-10-CM | POA: Insufficient documentation

## 2020-10-10 DIAGNOSIS — K317 Polyp of stomach and duodenum: Secondary | ICD-10-CM | POA: Diagnosis not present

## 2020-10-10 DIAGNOSIS — D122 Benign neoplasm of ascending colon: Secondary | ICD-10-CM | POA: Diagnosis not present

## 2020-10-10 DIAGNOSIS — D124 Benign neoplasm of descending colon: Secondary | ICD-10-CM | POA: Diagnosis not present

## 2020-10-10 DIAGNOSIS — Z87891 Personal history of nicotine dependence: Secondary | ICD-10-CM | POA: Insufficient documentation

## 2020-10-10 DIAGNOSIS — K3189 Other diseases of stomach and duodenum: Secondary | ICD-10-CM | POA: Diagnosis not present

## 2020-10-10 DIAGNOSIS — I252 Old myocardial infarction: Secondary | ICD-10-CM | POA: Insufficient documentation

## 2020-10-10 DIAGNOSIS — D125 Benign neoplasm of sigmoid colon: Secondary | ICD-10-CM | POA: Insufficient documentation

## 2020-10-10 DIAGNOSIS — Z856 Personal history of leukemia: Secondary | ICD-10-CM | POA: Insufficient documentation

## 2020-10-10 HISTORY — PX: BIOPSY: SHX5522

## 2020-10-10 HISTORY — PX: ESOPHAGOGASTRODUODENOSCOPY (EGD) WITH PROPOFOL: SHX5813

## 2020-10-10 HISTORY — PX: POLYPECTOMY: SHX5525

## 2020-10-10 HISTORY — PX: COLONOSCOPY WITH PROPOFOL: SHX5780

## 2020-10-10 LAB — SARS CORONAVIRUS 2 (TAT 6-24 HRS): SARS Coronavirus 2: NEGATIVE

## 2020-10-10 LAB — HM COLONOSCOPY

## 2020-10-10 SURGERY — COLONOSCOPY WITH PROPOFOL
Anesthesia: General

## 2020-10-10 MED ORDER — PROPOFOL 10 MG/ML IV BOLUS
INTRAVENOUS | Status: DC | PRN
  Administered 2020-10-10: 100 mg via INTRAVENOUS

## 2020-10-10 MED ORDER — STERILE WATER FOR IRRIGATION IR SOLN
Status: DC | PRN
  Administered 2020-10-10: 200 mL

## 2020-10-10 MED ORDER — LIDOCAINE HCL (CARDIAC) PF 100 MG/5ML IV SOSY
PREFILLED_SYRINGE | INTRAVENOUS | Status: DC | PRN
  Administered 2020-10-10: 30 mg via INTRAVENOUS

## 2020-10-10 MED ORDER — PROPOFOL 10 MG/ML IV BOLUS
INTRAVENOUS | Status: AC
  Filled 2020-10-10: qty 20

## 2020-10-10 MED ORDER — PROPOFOL 10 MG/ML IV BOLUS
INTRAVENOUS | Status: AC
  Filled 2020-10-10: qty 40

## 2020-10-10 MED ORDER — LACTATED RINGERS IV SOLN: INTRAVENOUS | Status: DC

## 2020-10-10 MED ORDER — PROPOFOL 500 MG/50ML IV EMUL
INTRAVENOUS | Status: DC | PRN
  Administered 2020-10-10: 100 ug/kg/min via INTRAVENOUS

## 2020-10-10 NOTE — Transfer of Care (Signed)
Immediate Anesthesia Transfer of Care Note  Patient: Adam Reyes  Procedure(s) Performed: COLONOSCOPY WITH PROPOFOL (N/A ) ESOPHAGOGASTRODUODENOSCOPY (EGD) WITH PROPOFOL (N/A ) BIOPSY POLYPECTOMY  Patient Location: PACU  Anesthesia Type:General  Level of Consciousness: awake, alert  and oriented  Airway & Oxygen Therapy: Patient Spontanous Breathing  Post-op Assessment: Report given to RN, Post -op Vital signs reviewed and stable and Patient moving all extremities X 4  Post vital signs: Reviewed and stable  Last Vitals:  Vitals Value Taken Time  BP    Temp    Pulse    Resp    SpO2      Last Pain:  Vitals:   10/10/20 0837  TempSrc:   PainSc: 0-No pain      Patients Stated Pain Goal: 8 (29/03/79 5583)  Complications: No complications documented.

## 2020-10-10 NOTE — Interval H&P Note (Signed)
History and Physical Interval Note:  10/10/2020 8:22 AM  Adam Reyes is a 65 y.o. 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  Patient denies any complaints at the moment. Reports that he is receiving IV iron for his anemia. Last Hb from 09/13/2020 was 7.1. Denies having any melena or hematochezia but his stool  has been darker than usual. Denies having any other complaint.  Last RPR:XYVOP Last Colonoscopy: 10/11/2015, patient had a 4 mm polyp in the cecum, a 12 mm polyp in the cecum (removed in a piecemeal fashion) had 2 hemostatic clips placed, 4 mm polyp in the ascending colon (1 clip was placed upon resection), a 10 mm polyp in the ascending colon (1 clip was placed after resection), a 10 mm polyp in the splenic flexure (1 clip placed), a 14 mm polyp in the splenic flexure (1 clip was placed), diverticulosis and internal hemorrhoids.  All polyps except 1 were tubular adenomas, there was only one sessile serrated lesion in the splenic flexure.  BP (!) 156/81   Pulse 80   Temp (!) 97.5 F (36.4 C) (Oral)   Resp 14   Ht 6\' 4"  (1.93 m)   Wt 117.9 kg   SpO2 98%   BMI 31.65 kg/m  GENERAL: The patient is AO x3, in no acute distress. HEENT: Head is normocephalic and atraumatic. EOMI are intact. Mouth is well hydrated and without lesions. NECK: Supple. No masses LUNGS: Clear to auscultation. No presence of rhonchi/wheezing/rales. Adequate chest expansion HEART: RRR, normal s1 and s2. ABDOMEN: Soft, nontender, no guarding, no peritoneal signs, and nondistended. BS +. No masses. EXTREMITIES: Without any cyanosis, clubbing, rash, lesions or edema. NEUROLOGIC: AOx3, no focal motor deficit. SKIN: no jaundice, no rashes   Adam Reyes  has presented today for surgery, with the diagnosis of Iron Deficiency Anemia Hx of colon polyps.  The various methods of treatment have been discussed with the patient and family. After  consideration of risks, benefits and other options for treatment, the patient has consented to  Procedure(s) with comments: COLONOSCOPY WITH PROPOFOL (N/A) - AM ESOPHAGOGASTRODUODENOSCOPY (EGD) WITH PROPOFOL (N/A) as a surgical intervention.  The patient's history has been reviewed, patient examined, no change in status, stable for surgery.  I have reviewed the patient's chart and labs.  Questions were answered to the patient's satisfaction.     [name]

## 2020-10-10 NOTE — Telephone Encounter (Signed)
Per op note - patient needs capsule study scheduled

## 2020-10-10 NOTE — Anesthesia Preprocedure Evaluation (Addendum)
Anesthesia Evaluation  Patient identified by MRN, date of birth, ID band Patient awake    Reviewed: Allergy & Precautions, NPO status , Patient's Chart, lab work & pertinent test results  History of Anesthesia Complications Negative for: history of anesthetic complications  Airway Mallampati: III  TM Distance: >3 FB Neck ROM: Full    Dental  (+) Dental Advisory Given, Teeth Intact   Pulmonary shortness of breath and with exertion, former smoker,    Pulmonary exam normal breath sounds clear to auscultation       Cardiovascular Exercise Tolerance: Good + angina with exertion + CAD (non obstructive CAD), + Past MI and + DOE  Normal cardiovascular exam+ dysrhythmias (frequent PVCs)  Rhythm:Regular Rate:Normal  EKG- NSR, Occasional pvcs   Neuro/Psych negative neurological ROS  negative psych ROS   GI/Hepatic negative GI ROS, (+)     substance abuse  alcohol use,   Endo/Other  negative endocrine ROS  Renal/GU negative Renal ROS     Musculoskeletal negative musculoskeletal ROS (+)   Abdominal   Peds  Hematology  (+) Blood dyscrasia (Chronic lymphocytic leukemia (CLL), B-cell (HCC)), anemia ,   Anesthesia Other Findings   Reproductive/Obstetrics negative OB ROS                            Anesthesia Physical Anesthesia Plan  ASA: III  Anesthesia Plan: General   Post-op Pain Management:    Induction: Intravenous  PONV Risk Score and Plan: Propofol infusion  Airway Management Planned: Nasal Cannula, Natural Airway and Simple Face Mask  Additional Equipment:   Intra-op Plan:   Post-operative Plan:   Informed Consent: I have reviewed the patients History and Physical, chart, labs and discussed the procedure including the risks, benefits and alternatives for the proposed anesthesia with the patient or authorized representative who has indicated his/her understanding and acceptance.      Dental advisory given  Plan Discussed with: CRNA and Surgeon  Anesthesia Plan Comments:         Anesthesia Quick Evaluation

## 2020-10-10 NOTE — Telephone Encounter (Signed)
Noted thanks °

## 2020-10-10 NOTE — Anesthesia Postprocedure Evaluation (Signed)
Anesthesia Post Note  Patient: Adam Reyes  Procedure(s) Performed: COLONOSCOPY WITH PROPOFOL (N/A ) ESOPHAGOGASTRODUODENOSCOPY (EGD) WITH PROPOFOL (N/A ) BIOPSY POLYPECTOMY  Patient location during evaluation: Endoscopy Anesthesia Type: General Level of consciousness: awake and alert Pain management: pain level controlled Vital Signs Assessment: post-procedure vital signs reviewed and stable Respiratory status: spontaneous breathing, nonlabored ventilation, respiratory function stable and patient connected to nasal cannula oxygen Cardiovascular status: blood pressure returned to baseline and stable Postop Assessment: no apparent nausea or vomiting Anesthetic complications: no   No complications documented.   Last Vitals:  Vitals:   10/10/20 0705 10/10/20 1000  BP: (!) 156/81 123/70  Pulse: 80 73  Resp: 14 12  Temp: (!) 36.4 C 36.4 C  SpO2: 98%     Last Pain:  Vitals:   10/10/20 1000  TempSrc: Oral  PainSc: 0-No pain                 Talitha Givens

## 2020-10-10 NOTE — Op Note (Signed)
[facility] Patient Name: Adam Reyes Procedure Date: 10/10/2020 8:28 AM MRN: [id number] Date of Birth: 1955/07/31 Attending MD: Maylon Peppers ,  CSN: [id number] Age: 65 Admit Type: Outpatient Procedure:                Upper GI endoscopy Indications:              Iron deficiency anemia Providers:                Maylon Peppers, Lambert Mody, Kristine L.                            Risa Grill, Technician Referring MD:              Medicines:                Monitored Anesthesia Care Complications:            No immediate complications. Estimated Blood Loss:     Estimated blood loss: none. Procedure:                Pre-Anesthesia Assessment:                           - Prior to the procedure, a History and Physical                            was performed, and patient medications, allergies                            and sensitivities were reviewed. The patient's                            tolerance of previous anesthesia was reviewed.                           - The risks and benefits of the procedure and the                            sedation options and risks were discussed with the                            patient. All questions were answered and informed                            consent was obtained.                           - ASA Grade Assessment: II - A patient with mild                            systemic disease.                           After obtaining informed consent, the endoscope was                            passed under direct vision. Throughout the  procedure, the patient's blood pressure, pulse, and                            oxygen saturations were monitored continuously. The                            [id number]-[id number] ([id number]) was introduced through the                            mouth, and advanced to the second part of duodenum.                            The upper GI endoscopy was accomplished without                             difficulty. The patient tolerated the procedure                            well. Scope In: 8:38:17 AM Scope Out: 8:46:33 AM Total Procedure Duration: 0 hours 8 minutes 16 seconds  Findings:      The examined esophagus was normal.      A few 2 to 3 mm sessile polyps with no stigmata of recent bleeding were       found in the gastric body. These appeared to be fundic gland polyps.      A few localized erosions with no stigmata of recent bleeding were found       in the gastric body. Biopsies were taken with a cold forceps for       Helicobacter pylori testing.      Localized mildly erythematous mucosa without bleeding was found in the       gastric antrum.      The examined duodenum was normal. Biopsies for histology were taken with       a cold forceps for evaluation of celiac disease. Impression:               - Normal esophagus.                           - A few gastric polyps.                           - Gastric erosions with no stigmata of recent                            bleeding. Biopsied.                           - Erythematous mucosa in the antrum.                           - Normal examined duodenum. Biopsied. Moderate Sedation:      Per Anesthesia Care Recommendation:           - Discharge patient to home (ambulatory).                           - Resume previous  diet.                           - Await pathology results. Procedure Code(s):        --- Professional ---                           [phone], Esophagogastroduodenoscopy, flexible,                            transoral; with biopsy, single or multiple Diagnosis Code(s):        --- Professional ---                           K31.7, Polyp of stomach and duodenum                           K25.9, Gastric ulcer, unspecified as acute or                            chronic, without hemorrhage or perforation                           K31.89, Other diseases of stomach and duodenum                           D50.9, Iron  deficiency anemia, unspecified CPT copyright 2019 [employer]. All rights reserved. The codes documented in this report are preliminary and upon coder review may  be revised to meet current compliance requirements. Maylon Peppers, MD Maylon Peppers,  10/10/2020 8:50:20 AM This report has been signed electronically. Number of Addenda: 0

## 2020-10-10 NOTE — Discharge Instructions (Signed)
You are being discharged to home.  Resume your previous diet.  We are waiting for your pathology results.  Your physician has recommended a repeat colonoscopy in three years for surveillance.  Schedule a capsule endoscopy.  Colonoscopy, Adult, Care After This sheet gives you information about how to care for yourself after your procedure. Your doctor may also give you more specific instructions. If you have problems or questions, call your doctor. What can I expect after the procedure? After the procedure, it is common to have:  A small amount of blood in your poop (stool) for 24 hours.  Some gas.  Mild cramping or bloating in your belly (abdomen). Follow these instructions at home: Eating and drinking  Drink enough fluid to keep your pee (urine) pale yellow.  Follow instructions from your doctor about what you cannot eat or drink.  Return to your normal diet as told by your doctor. Avoid heavy or fried foods that are hard to digest.   Activity  Rest as told by your doctor.  Do not sit for a long time without moving. Get up to take short walks every 1-2 hours. This is important. Ask for help if you feel weak or unsteady.  Return to your normal activities as told by your doctor. Ask your doctor what activities are safe for you. To help cramping and bloating:  Try walking around.  Put heat on your belly as told by your doctor. Use the heat source that your doctor recommends, such as a moist heat pack or a heating pad. ? Put a towel between your skin and the heat source. ? Leave the heat on for 20-30 minutes. ? Remove the heat if your skin turns bright red. This is very important if you are unable to feel pain, heat, or cold. You may have a greater risk of getting burned.   General instructions  If you were given a medicine to help you relax (sedative) during your procedure, it can affect you for many hours. Do not drive or use machinery until your doctor says that it is  safe.  For the first 24 hours after the procedure: ? Do not sign important documents. ? Do not drink alcohol. ? Do your daily activities more slowly than normal. ? Eat foods that are soft and easy to digest.  Take over-the-counter or prescription medicines only as told by your doctor.  Keep all follow-up visits as told by your doctor. This is important. Contact a doctor if:  You have blood in your poop 2-3 days after the procedure. Get help right away if:  You have more than a small amount of blood in your poop.  You see large clumps of tissue (blood clots) in your poop.  Your belly is swollen.  You feel like you may vomit (nauseous).  You vomit.  You have a fever.  You have belly pain that gets worse, and medicine does not help your pain. Summary  After the procedure, it is common to have a small amount of blood in your poop. You may also have mild cramping and bloating in your belly.  If you were given a medicine to help you relax (sedative) during your procedure, it can affect you for many hours. Do not drive or use machinery until your doctor says that it is safe.  Get help right away if you have a lot of blood in your poop, feel like you may vomit, have a fever, or have more belly pain. This information  is not intended to replace advice given to you by your health care provider. Make sure you discuss any questions you have with your health care provider. Document Revised: 05/07/2019 Document Reviewed: 01/25/2019 Elsevier Patient Education  [place]. Colon Polyps  Colon polyps are tissue growths inside the colon, which is part of the large intestine. They are one of the types of polyps that can grow in the body. A polyp may be a round bump or a mushroom-shaped growth. You could have one polyp or more than one. Most colon polyps are noncancerous (benign). However, some colon polyps can become cancerous over time. Finding and removing the polyps early can help  prevent this. What are the causes? The exact cause of colon polyps is not known. What increases the risk? The following factors may make you more likely to develop this condition:  Having a family history of colorectal cancer or colon polyps.  Being older than 63 years of age.  Being younger than 63 years of age and having a significant family history of colorectal cancer or colon polyps or a genetic condition that puts you at higher risk of getting colon polyps.  Having inflammatory bowel disease, such as ulcerative colitis or Crohn's disease.  Having certain conditions passed from parent to child (hereditary conditions), such as: ? Familial adenomatous polyposis (FAP). ? Lynch syndrome. ? Turcot syndrome. ? Peutz-Jeghers syndrome. ? MUTYH-associated polyposis (MAP).  Being overweight.  Certain lifestyle factors. These include smoking cigarettes, drinking too much alcohol, not getting enough exercise, and eating a diet that is high in fat and red meat and low in fiber.  Having had childhood cancer that was treated with radiation of the abdomen. What are the signs or symptoms? Many times, there are no symptoms. If you have symptoms, they may include:  Blood coming from the rectum during a bowel movement.  Blood in the stool (feces). The blood may be bright red or very dark in color.  Pain in the abdomen.  A change in bowel habits, such as constipation or diarrhea. How is this diagnosed? This condition is diagnosed with a colonoscopy. This is a procedure in which a lighted, flexible scope is inserted into the opening between the buttocks (anus) and then passed into the colon to examine the area. Polyps are sometimes found when a colonoscopy is done as part of routine cancer screening tests. How is this treated? This condition is treated by removing any polyps that are found. Most polyps can be removed during a colonoscopy. Those polyps will then be tested for cancer. Additional  treatment may be needed depending on the results of testing. Follow these instructions at home: Eating and drinking  Eat foods that are high in fiber, such as fruits, vegetables, and whole grains.  Eat foods that are high in calcium and vitamin D, such as milk, cheese, yogurt, eggs, liver, fish, and broccoli.  Limit foods that are high in fat, such as fried foods and desserts.  Limit the amount of red meat, precooked or cured meat, or other processed meat that you eat, such as hot dogs, sausages, bacon, or meat loaves.  Limit sugary drinks.   Lifestyle  Maintain a healthy weight, or lose weight if recommended by your health care provider.  Exercise every day or as told by your health care provider.  Do not use any products that contain nicotine or tobacco, such as cigarettes, e-cigarettes, and chewing tobacco. If you need help quitting, ask your health care provider.  Do not drink alcohol if: ? Your health care provider tells you not to drink. ? You are pregnant, may be pregnant, or are planning to become pregnant.  If you drink alcohol: ? Limit how much you use to:  0-1 drink a day for women.  0-2 drinks a day for men. ? Know how much alcohol is in your drink. In the [place] one drink equals one 12 oz bottle of beer (355 mL), one 5 oz glass of wine (148 mL), or one 1 oz glass of hard liquor (44 mL). General instructions  Take over-the-counter and prescription medicines only as told by your health care provider.  Keep all follow-up visits. This is important. This includes having regularly scheduled colonoscopies. Talk to your health care provider about when you need a colonoscopy. Contact a health care provider if:  You have new or worsening bleeding during a bowel movement.  You have new or increased blood in your stool.  You have a change in bowel habits.  You lose weight for no known reason. Summary  Colon polyps are tissue growths inside the colon, which is part of  the large intestine. They are one type of polyp that can grow in the body.  Most colon polyps are noncancerous (benign), but some can become cancerous over time.  This condition is diagnosed with a colonoscopy.  This condition is treated by removing any polyps that are found. Most polyps can be removed during a colonoscopy. This information is not intended to replace advice given to you by your health care provider. Make sure you discuss any questions you have with your health care provider. Document Revised: 10/20/2019 Document Reviewed: 10/20/2019 Elsevier Patient Education  2021 [place]. Upper Endoscopy, Adult, Care After This sheet gives you information about how to care for yourself after your procedure. Your health care provider may also give you more specific instructions. If you have problems or questions, contact your health care provider. What can I expect after the procedure? After the procedure, it is common to have:  A sore throat.  Mild stomach pain or discomfort.  Bloating.  Nausea. Follow these instructions at home:  Follow instructions from your health care provider about what to eat or drink after your procedure.  Return to your normal activities as told by your health care provider. Ask your health care provider what activities are safe for you.  Take over-the-counter and prescription medicines only as told by your health care provider.  If you were given a sedative during the procedure, it can affect you for several hours. Do not drive or operate machinery until your health care provider says that it is safe.  Keep all follow-up visits as told by your health care provider. This is important.   Contact a health care provider if you have:  A sore throat that lasts longer than one day.  Trouble swallowing. Get help right away if:  You vomit blood or your vomit looks like coffee grounds.  You have: ? A fever. ? Bloody, black, or tarry stools. ? A  severe sore throat or you cannot swallow. ? Difficulty breathing. ? Severe pain in your chest or abdomen. Summary  After the procedure, it is common to have a sore throat, mild stomach discomfort, bloating, and nausea.  If you were given a sedative during the procedure, it can affect you for several hours. Do not drive or operate machinery until your health care provider says that it is safe.  Follow instructions from  your health care provider about what to eat or drink after your procedure.  Return to your normal activities as told by your health care provider. This information is not intended to replace advice given to you by your health care provider. Make sure you discuss any questions you have with your health care provider. Document Revised: 06/29/2019 Document Reviewed: 12/01/2017 Elsevier Patient Education  2021 [place]. Gastritis, Adult  Gastritis is swelling (inflammation) of the stomach. Gastritis can develop quickly (acute). It can also develop slowly over time (chronic). It is important to get help for this condition. If you do not get help, your stomach can bleed, and you can get sores (ulcers) in your stomach. What are the causes? This condition may be caused by:  Germs that get to your stomach.  Drinking too much alcohol.  Medicines you are taking.  Too much acid in the stomach.  A disease of the intestines or stomach.  Stress.  An allergic reaction.  Crohn's disease.  Some cancer treatments (radiation). Sometimes the cause of this condition is not known. What are the signs or symptoms? Symptoms of this condition include:  Pain in your stomach.  A burning feeling in your stomach.  Feeling sick to your stomach (nauseous).  Throwing up (vomiting).  Feeling too full after you eat.  Weight loss.  Bad breath.  Throwing up blood.  Blood in your poop (stool). How is this diagnosed? This condition may be diagnosed with:  Your medical history  and symptoms.  A physical exam.  Tests. These can include: ? Blood tests. ? Stool tests. ? A procedure to look inside your stomach (upper endoscopy). ? A test in which a sample of tissue is taken for testing (biopsy). How is this treated? Treatment for this condition depends on what caused it. You may be given:  Antibiotic medicine, if your condition was caused by germs.  H2 blockers and similar medicines, if your condition was caused by too much acid. Follow these instructions at home: Medicines  Take over-the-counter and prescription medicines only as told by your doctor.  If you were prescribed an antibiotic medicine, take it as told by your doctor. Do not stop taking it even if you start to feel better. Eating and drinking  Eat small meals often, instead of large meals.  Avoid foods and drinks that make your symptoms worse.  Drink enough fluid to keep your pee (urine) pale yellow.   Alcohol use  Do not drink alcohol if: ? Your doctor tells you not to drink. ? You are pregnant, may be pregnant, or are planning to become pregnant.  If you drink alcohol: ? Limit your use to:  0-1 drink a day for women.  0-2 drinks a day for men. ? Be aware of how much alcohol is in your drink. In the [place] one drink equals one 12 oz bottle of beer (355 mL), one 5 oz glass of wine (148 mL), or one 1 oz glass of hard liquor (44 mL). General instructions  Talk with your doctor about ways to manage stress. You can exercise or do deep breathing, meditation, or yoga.  Do not smoke or use products that have nicotine or tobacco. If you need help quitting, ask your doctor.  Keep all follow-up visits as told by your doctor. This is important. Contact a doctor if:  Your symptoms get worse.  Your symptoms go away and then come back. Get help right away if:  You throw up blood or something  that looks like coffee grounds.  You have black or dark red poop.  You throw up any time you try  to drink fluids.  Your stomach pain gets worse.  You have a fever.  You do not feel better after one week. Summary  Gastritis is swelling (inflammation) of the stomach.  You must get help for this condition. If you do not get help, your stomach can bleed, and you can get sores (ulcers).  This condition is diagnosed with medical history, physical exam, or tests.  You can be treated with medicines for germs or medicines to block too much acid in your stomach. This information is not intended to replace advice given to you by your health care provider. Make sure you discuss any questions you have with your health care provider. Document Revised: 11/18/2017 Document Reviewed: 11/18/2017 Elsevier Patient Education  2021 [place] are being discharged to home.  Resume your previous diet.  We are waiting for your pathology results.

## 2020-10-10 NOTE — Op Note (Signed)
[facility] Patient Name: Adam Reyes Procedure Date: 10/10/2020 8:49 AM MRN: [id number] Date of Birth: 1955/10/11 Attending MD: Maylon Peppers ,  CSN: [id number] Age: 65 Admit Type: Outpatient Procedure:                Colonoscopy Indications:              Iron deficiency anemia Providers:                Maylon Peppers, Lambert Mody, Kristine L.                            Risa Grill, Technician Referring MD:              Medicines:                Monitored Anesthesia Care Complications:            No immediate complications. Estimated Blood Loss:     Estimated blood loss: none. Procedure:                Pre-Anesthesia Assessment:                           - Prior to the procedure, a History and Physical                            was performed, and patient medications, allergies                            and sensitivities were reviewed. The patient's                            tolerance of previous anesthesia was reviewed.                           - The risks and benefits of the procedure and the                            sedation options and risks were discussed with the                            patient. All questions were answered and informed                            consent was obtained.                           - ASA Grade Assessment: II - A patient with mild                            systemic disease.                           After obtaining informed consent, the colonoscope                            was passed under direct vision. Throughout the  procedure, the patient's blood pressure, pulse, and                            oxygen saturations were monitored continuously. The                            [id number]-HQ190L ([id number]) scope was introduced through                            the anus and advanced to the the terminal ileum.                            The colonoscopy was performed with moderate                             difficulty due to unsatisfactory bowel prep.                            Successful completion of the procedure was aided by                            lavage. The patient tolerated the procedure well. Scope In: 8:52:07 AM Scope Out: 9:52:08 AM Scope Withdrawal Time: 0 hours 44 minutes 49 seconds  Total Procedure Duration: 1 hour 0 minutes 1 second  Findings:      The perianal and digital rectal examinations were normal.      The terminal ileum appeared normal.      A 8 mm polyp was found in the ascending colon. The polyp was sessile.       The polyp was removed with a cold snare. Resection and retrieval were       complete. To prevent bleeding after the polypectomy, one hemostatic clip       was successfully placed. There was no bleeding at the end of the       procedure.      A 8 mm polyp was found in the ascending colon. The polyp was sessile.       The polyp was removed with a cold snare. Resection was complete, but the       polyp tissue was not retrieved. To prevent bleeding after the       polypectomy as there was significant persistent oozing after the       intervention, four hemostatic clips were successfully placed. There was       no bleeding at the end of the procedure.      A few large-mouthed diverticula were found in the sigmoid colon,       descending colon and transverse colon.      Four sessile polyps were found in the sigmoid colon and descending       colon. The polyps were 4 to 8 mm in size. These polyps were removed with       a cold snare. Resection and retrieval were complete.      A 6 mm polyp was found in the sigmoid colon. The polyp was sessile. The       polyp was removed with a hot snare. Resection and retrieval were       complete.      The  retroflexed view of the distal rectum and anal verge was normal and       showed no anal or rectal abnormalities. Impression:               - The examined portion of the ileum was normal.                           -  One 8 mm polyp in the ascending colon, removed                            with a cold snare. Resected and retrieved. Clip was                            placed.                           - One 8 mm polyp in the ascending colon, removed                            with a cold snare. Complete resection. Polyp tissue                            not retrieved. Clips were placed.                           - Diverticulosis in the sigmoid colon, in the                            descending colon and in the transverse colon.                           - Four 4 to 8 mm polyps in the sigmoid colon and in                            the descending colon, removed with a cold snare.                            Resected and retrieved.                           - One 6 mm polyp in the sigmoid colon, removed with                            a hot snare. Resected and retrieved.                           - The distal rectum and anal verge are normal on                            retroflexion view. Moderate Sedation:      Per Anesthesia Care Recommendation:           - Discharge patient to home (ambulatory).                           -  Resume previous diet.                           - Await pathology results.                           - Repeat colonoscopy in 3 years for surveillance.                           - Schedule a capsule endoscopy. Procedure Code(s):        --- Professional ---                           [phone], Colonoscopy, flexible; with removal of                            tumor(s), polyp(s), or other lesion(s) by snare                            technique Diagnosis Code(s):        --- Professional ---                           K63.5, Polyp of colon                           D50.9, Iron deficiency anemia, unspecified                           K57.30, Diverticulosis of large intestine without                            perforation or abscess without bleeding CPT copyright 2019 American Medical  Association. All rights reserved. The codes documented in this report are preliminary and upon coder review may  be revised to meet current compliance requirements. Maylon Peppers, MD Maylon Peppers,  10/10/2020 10:05:39 AM This report has been signed electronically. Number of Addenda: 0

## 2020-10-11 LAB — SURGICAL PATHOLOGY

## 2020-10-11 MED FILL — IMBRUVICA 420 MG TAB: 420 | 28 days supply | Qty: 28 | Fill #0

## 2020-10-12 ENCOUNTER — Encounter (INDEPENDENT_AMBULATORY_CARE_PROVIDER_SITE_OTHER): Payer: Self-pay | Admitting: *Deleted

## 2020-10-16 ENCOUNTER — Encounter (HOSPITAL_COMMUNITY): Payer: Self-pay | Admitting: Gastroenterology

## 2020-10-19 ENCOUNTER — Encounter (INDEPENDENT_AMBULATORY_CARE_PROVIDER_SITE_OTHER): Payer: Self-pay

## 2020-10-26 ENCOUNTER — Inpatient Hospital Stay (HOSPITAL_COMMUNITY): Payer: Managed Care, Other (non HMO) | Attending: Hematology

## 2020-10-26 ENCOUNTER — Other Ambulatory Visit: Payer: Self-pay

## 2020-10-26 DIAGNOSIS — D649 Anemia, unspecified: Secondary | ICD-10-CM | POA: Insufficient documentation

## 2020-10-26 DIAGNOSIS — R7989 Other specified abnormal findings of blood chemistry: Secondary | ICD-10-CM | POA: Insufficient documentation

## 2020-10-26 DIAGNOSIS — Z87891 Personal history of nicotine dependence: Secondary | ICD-10-CM | POA: Diagnosis not present

## 2020-10-26 DIAGNOSIS — D696 Thrombocytopenia, unspecified: Secondary | ICD-10-CM | POA: Insufficient documentation

## 2020-10-26 DIAGNOSIS — C8300 Small cell B-cell lymphoma, unspecified site: Secondary | ICD-10-CM | POA: Diagnosis not present

## 2020-10-26 DIAGNOSIS — C9110 Chronic lymphocytic leukemia of B-cell type not having achieved remission: Secondary | ICD-10-CM

## 2020-10-26 LAB — COMPREHENSIVE METABOLIC PANEL
ALT: 19 U/L (ref 0–44)
AST: 19 U/L (ref 15–41)
Albumin: 4.1 g/dL (ref 3.5–5.0)
Alkaline Phosphatase: 54 U/L (ref 38–126)
Anion gap: 10 (ref 5–15)
BUN: 17 mg/dL (ref 8–23)
CO2: 24 mmol/L (ref 22–32)
Calcium: 8.7 mg/dL — ABNORMAL LOW (ref 8.9–10.3)
Chloride: 104 mmol/L (ref 98–111)
Creatinine, Ser: 1.12 mg/dL (ref 0.61–1.24)
GFR, Estimated: >60 mL/min (ref >60)
Glucose, Bld: 72 mg/dL (ref 70–99)
Potassium: 3.7 mmol/L (ref 3.5–5.1)
Sodium: 138 mmol/L (ref 135–145)
Total Bilirubin: 0.8 mg/dL (ref 0.3–1.2)
Total Protein: 6.3 g/dL — ABNORMAL LOW (ref 6.5–8.1)

## 2020-10-26 LAB — CBC WITH DIFFERENTIAL/PLATELET
Abs Immature Granulocytes: 0.04 10*3/uL (ref 0.00–0.07)
Basophils Absolute: 0 10*3/uL (ref 0.0–0.1)
Basophils Relative: 1 %
Eosinophils Absolute: 0.1 10*3/uL (ref 0.0–0.5)
Eosinophils Relative: 1 %
HCT: 39.1 % (ref 39.0–52.0)
Hemoglobin: 12.1 g/dL — ABNORMAL LOW (ref 13.0–17.0)
Immature Granulocytes: 1 %
Lymphocytes Relative: 6 %
Lymphs Abs: 0.4 10*3/uL — ABNORMAL LOW (ref 0.7–4.0)
MCH: 28.9 pg (ref 26.0–34.0)
MCHC: 30.9 g/dL (ref 30.0–36.0)
MCV: 93.5 fL (ref 80.0–100.0)
Monocytes Absolute: 0.6 10*3/uL (ref 0.1–1.0)
Monocytes Relative: 9 %
Neutro Abs: 5.6 10*3/uL (ref 1.7–7.7)
Neutrophils Relative %: 82 %
Platelets: 144 10*3/uL — ABNORMAL LOW (ref 150–400)
RBC: 4.18 MIL/uL — ABNORMAL LOW (ref 4.22–5.81)
RDW: 19.9 % — ABNORMAL HIGH (ref 11.5–15.5)
WBC: 6.8 10*3/uL (ref 4.0–10.5)
nRBC: 0 % (ref 0.0–0.2)

## 2020-10-26 LAB — MAGNESIUM: Magnesium: 2.1 mg/dL (ref 1.7–2.4)

## 2020-10-26 LAB — LACTATE DEHYDROGENASE: LDH: 133 U/L (ref 98–192)

## 2020-10-31 ENCOUNTER — Other Ambulatory Visit: Payer: Self-pay

## 2020-10-31 ENCOUNTER — Inpatient Hospital Stay (HOSPITAL_COMMUNITY): Payer: Managed Care, Other (non HMO) | Admitting: Hematology

## 2020-10-31 VITALS — BP 156/75 | HR 75 | Temp 97.9°F | Resp 18 | Wt 256.8 lb

## 2020-10-31 DIAGNOSIS — C8300 Small cell B-cell lymphoma, unspecified site: Secondary | ICD-10-CM

## 2020-10-31 DIAGNOSIS — D649 Anemia, unspecified: Secondary | ICD-10-CM | POA: Diagnosis not present

## 2020-10-31 NOTE — Patient Instructions (Signed)
[place] at [facility] Discharge Instructions  You were seen today by Dr. Delton Coombes. He went over your recent results. Start taking iron tablet every other day. Dr. Delton Coombes will see you back in 6 weeks for labs and follow up.   Thank you for choosing [place] at [facility] to provide your oncology and hematology care.  To afford each patient quality time with our provider, please arrive at least 15 minutes before your scheduled appointment time.   If you have a lab appointment with the [facility] please come in thru the Main Entrance and check in at the main information desk  You need to re-schedule your appointment should you arrive 10 or more minutes late.  We strive to give you quality time with our providers, and arriving late affects you and other patients whose appointments are after yours.  Also, if you no show three or more times for appointments you may be dismissed from the clinic at the providers discretion.     Again, thank you for choosing [facility].  Our hope is that these requests will decrease the amount of time that you wait before being seen by our physicians.       _____________________________________________________________  Should you have questions after your visit to [facility], please contact our office at [phone] between the hours of 8:00 a.m. and 4:30 p.m.  Voicemails left after 4:00 p.m. will not be returned until the following business day.  For prescription refill requests, have your pharmacy contact our office and allow 72 hours.    Cancer Center Support Programs:   > Cancer Support Group  2nd Tuesday of the month 1pm-2pm, Journey Room

## 2020-10-31 NOTE — Progress Notes (Signed)
Adam Reyes, [place] [address]   CLINIC:  Medical Oncology/Hematology  PCP:  [name], MD [place] / [place] [address]  [phone]  REASON FOR VISIT:  Follow-up for small lymphocytic lymphoma and symptomatic anemia  PRIOR THERAPY: Rituximab monthly from 01/01/2018 to 05/21/2018  CURRENT THERAPY: Ibrutinib 420 mg daily; intermittent Feraheme last on 09/29/2020  INTERVAL HISTORY:  Adam Reyes, a 65 y.o. male, returns for routine follow-up for his small lymphocytic lymphoma and symptomatic anemia. Adam Reyes was last seen on 09/18/2020.  Today he is accompanied by his wife and he reports feeling okay. He is taking ibrutinib daily and reports having brittle nails and easy bruising. He had his colonoscopy on 03/29 and had 7 polyps removed. He reports having darker stools, but denies melena, hematochezia or hematuria; he is taking iron tablet daily which he started at the beginning of March and reports having constipation alternating with diarrhea. He reports that his urine starts slowly and reports having discomfort while urinating but not afterwards; he also gets up twice a night to urinate. His energy levels are slowly improving but denies having F/C or night sweats.  He has gone back to work and is tolerating it well. He is scheduled to have a capsule endoscopy done this week. He is scheduled to have his follow-up with Gosrani on 06/23.   REVIEW OF SYSTEMS:  Review of Systems  Constitutional: Positive for appetite change (75%) and fatigue (75%). Negative for chills and fever.  HENT:   Negative for nosebleeds.   Gastrointestinal: Positive for constipation (on iron tablet) and diarrhea. Negative for blood in stool.  Genitourinary: Positive for difficulty urinating (discomfort while urinating) and nocturia. Negative for hematuria.   Hematological: Bruises/bleeds easily (easy bruising).  All other systems reviewed and are  negative.   PAST MEDICAL/SURGICAL HISTORY:  Past Medical History:  Diagnosis Date  . 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 Enlarged lymph node    left neck  . STEMI (ST elevation myocardial infarction) ([facility]) 03/05/2017   hx/notes 03/05/2017 -likely aborted anterior STEMI with 50% bifurcation LAD-Diag1.  No PCI.  Preserved EF   Past Surgical History:  Procedure Laterality Date  . BIOPSY  10/10/2020   Procedure: BIOPSY;  Surgeon: Harvel Quale, MD;  [facility];  Service: Gastroenterology;;  . COLONOSCOPY N/A 10/11/2015   Procedure: COLONOSCOPY;  Surgeon: Rogene Houston, MD;  [facility];  Service: Endoscopy;  Laterality: N/A;  10/11/2015  . COLONOSCOPY WITH PROPOFOL N/A 10/10/2020   Procedure: COLONOSCOPY WITH PROPOFOL;  Surgeon: Harvel Quale, MD;  [facility];  Service: Gastroenterology;  Laterality: N/A;  AM  . ESOPHAGOGASTRODUODENOSCOPY (EGD) WITH PROPOFOL N/A 10/10/2020   Procedure: ESOPHAGOGASTRODUODENOSCOPY (EGD) WITH PROPOFOL;  Surgeon: Harvel Quale, MD;  [facility];  Service: Gastroenterology;  Laterality: N/A;  . Graded Exercise Tolerance Test (GXT/ETT)  05/2017   10.7 METs (9: 25 min.  Reached 103% max predicted heart rate).  No EKG findings to suggest coronary ischemia.  Negative, low risk GXT  . LEFT HEART CATH AND CORONARY ANGIOGRAPHY N/A 03/05/2017   Procedure: LEFT HEART CATH AND CORONARY ANGIOGRAPHY;  Surgeon: Leonie Man, MD;  Location: [facility];  Service: Cardiovascular: pLAD-Diag1 50-55% (non-flow-limiiting).  EF ~50-55%.  although bifurcation lesion was presumed Culprit - no PCI (not flow limiting). - Med Rx.  . MASS BIOPSY Left 06/27/2015   Procedure: OPEN LEFT NECK BIOPSY ;  Surgeon: Su Teoh, MD;  Location: [facility];  Service: ENT;  Laterality: Left;  . POLYPECTOMY  10/10/2020   Procedure: POLYPECTOMY;  Surgeon: Castaneda Mayorga, Daniel, MD;  [facility];  Service: Gastroenterology;;  . REFRACTIVE SURGERY Bilateral     SOCIAL HISTORY:  Social History   Socioeconomic History  . Marital status: Married    Spouse name: Not on file  . Number of children: Not on file  . Years of education: Not on file  . Highest education level: Not on file  Occupational History  . Not on file  Tobacco Use  . Smoking status: Former Smoker    Years: 2.00    Types: Cigarettes  . Smokeless tobacco: Former User    Types: Chew, Snuff  . Tobacco comment: 03/06/2017 "quit smoking when I was young; quit chew/snuff in ~ 2008"  Vaping Use  . Vaping Use: Never used  Substance and Sexual Activity  . Alcohol use: Yes    Alcohol/week: 2.0 standard drinks    Types: 2 Cans of beer per week  . Drug use: No  . Sexual activity: Yes  Other Topics Concern  . Not on file  Social History Narrative   Married since 2001,third.Lives with wife.Drives gasoline tanker.   Social Determinants of Health   Financial Resource Strain: Low Risk   . Difficulty of Paying Living Expenses: Not hard at all  Food Insecurity: No Food Insecurity  . Worried About Running Out of Food in the Last Year: Never true  . Ran Out of Food in the Last Year: Never true  Transportation Needs: No Transportation Needs  . Lack of Transportation (Medical): No  . Lack of Transportation (Non-Medical): No  Physical Activity: Sufficiently Active  . Days of Exercise per Week: 5 days  . Minutes of Exercise per Session: 30 min  Stress: No Stress Concern Present  . Feeling of Stress : Not at all  Social Connections: Moderately Integrated  . Frequency of Communication with Friends and Family: More than three times a week  . Frequency of Social Gatherings with Friends and Family: More than three times a week  . Attends Religious Services:  More than 4 times per year  . Active Member of Clubs or Organizations: No  . Attends Club or Organization Meetings: Never  . Marital Status: Married  Intimate Partner Violence: Not At Risk  . Fear of Current or Ex-Partner: No  . Emotionally Abused: No  . Physically Abused: No  . Sexually Abused: No    FAMILY HISTORY:  Family History  Problem Relation Age of Onset  . Diabetes Mother   . Cancer Father   . Cancer Brother     CURRENT MEDICATIONS:  Current Outpatient Medications  Medication Sig Dispense Refill  . aspirin 81 MG chewable tablet Chew 1 tablet (81 mg total) by mouth every other day. 30 tablet 11  . atorvastatin (LIPITOR) 40 MG tablet TAKE 1 TABLET(40 MG) BY MOUTH DAILY (Patient taking differently: Take 40 mg by mouth daily.) 90 tablet 3  . cholecalciferol (VITAMIN D3) 25 MCG (1000 UNIT) tablet     . ferrous sulfate 324 (65 Fe) MG TBEC Take 36 mg by mouth daily.    . ibrutinib 420 MG TABS TAKE 1 TABLET (420 MG) BY MOUTH DAILY. 28 tablet 3  .   metoprolol tartrate (LOPRESSOR) 25 MG tablet TAKE 1/2 TABLET(12.5 MG) BY MOUTH TWICE DAILY (Patient taking differently: Take 12.5 mg by mouth 2 (two) times daily.) 90 tablet 3  . nitroGLYCERIN (NITROSTAT) 0.4 MG SL tablet Place 1 tablet (0.4 mg total) under the tongue every 5 (five) minutes as needed for chest pain. 25 tablet 6   No current facility-administered medications for this visit.    ALLERGIES:  No Known Allergies  PHYSICAL EXAM:  Performance status (ECOG): 0 - Asymptomatic  Vitals:   10/31/20 1613  BP: (!) 156/75  Pulse: 75  Resp: 18  Temp: 97.9 F (36.6 C)  SpO2: 96%   Wt Readings from Last 3 Encounters:  10/31/20 256 lb 12.8 oz (116.5 kg)  10/10/20 260 lb (117.9 kg)  10/04/20 263 lb 6.4 oz (119.5 kg)   Physical Exam Vitals reviewed.  Constitutional:      Appearance: Normal appearance. He is obese.  Cardiovascular:     Rate and Rhythm: Normal rate and regular rhythm.     Pulses: Normal pulses.      Heart sounds: Normal heart sounds.  Pulmonary:     Effort: Pulmonary effort is normal.     Breath sounds: Normal breath sounds.  Abdominal:     Palpations: Abdomen is soft. There is no hepatomegaly, splenomegaly or mass.     Tenderness: There is no abdominal tenderness.     Hernia: No hernia is present.  Musculoskeletal:     Right lower leg: No edema.     Left lower leg: No edema.  Neurological:     General: No focal deficit present.     Mental Status: He is alert and oriented to person, place, and time.  Psychiatric:        Mood and Affect: Mood normal.        Behavior: Behavior normal.     LABORATORY DATA:  I have reviewed the labs as listed.  CBC Latest Ref Rng & Units 10/26/2020 09/29/2020 09/13/2020  WBC 4.0 - 10.5 K/uL 6.8 5.9 8.0  Hemoglobin 13.0 - 17.0 g/dL 12.1(L) 9.0(L) 7.1(L)  Hematocrit 39.0 - 52.0 % 39.1 32.3(L) 24.7(L)  Platelets 150 - 400 K/uL 144(L) 218 222   CMP Latest Ref Rng & Units 10/26/2020 09/13/2020 08/31/2020  Glucose 70 - 99 mg/dL 72 82 77  BUN 8 - 23 mg/dL 17 16 20  Creatinine 0.61 - 1.24 mg/dL 1.12 1.03 1.13  Sodium 135 - 145 mmol/L 138 137 140  Potassium 3.5 - 5.1 mmol/L 3.7 3.6 3.7  Chloride 98 - 111 mmol/L 104 104 106  CO2 22 - 32 mmol/L 24 23 28  Calcium 8.9 - 10.3 mg/dL 8.7(L) 8.5(L) 8.6  Total Protein 6.5 - 8.1 g/dL 6.3(L) 6.3(L) 5.6(L)  Total Bilirubin 0.3 - 1.2 mg/dL 0.8 0.7 0.5  Alkaline Phos 38 - 126 U/L 54 51 -  AST 15 - 41 U/L 19 16 14  ALT 0 - 44 U/L 19 16 14      Component Value Date/Time   RBC 4.18 (L) 10/26/2020 1500   MCV 93.5 10/26/2020 1500   MCH 28.9 10/26/2020 1500   MCHC 30.9 10/26/2020 1500   RDW 19.9 (H) 10/26/2020 1500   LYMPHSABS 0.4 (L) 10/26/2020 1500   MONOABS 0.6 10/26/2020 1500   EOSABS 0.1 10/26/2020 1500   BASOSABS 0.0 10/26/2020 1500   Lab Results  Component Value Date   LDH 133 10/26/2020   LDH 126 09/13/2020   LDH 149 05/11/2020    DIAGNOSTIC   IMAGING:  I have independently reviewed the scans and  discussed with the patient. LONG TERM MONITOR (3-14 DAYS)  Result Date: 10/26/2020  Predominant underlying rhythm-Sinus Rhythm:  2 Runs of Ventricular Tachycardia (VT): Fastest 6 beats-138 bpm, longest 8 beats-121 bpm.  To clarify, with the reported DVT episodes was actually more consistent with PAT with aberrancy.  13 runs of Supraventricular Tachycardia (SVT)-actually Paroxysmal Atrial Tachycardia (PAT): fastest 9 beats-280 bpm, longest-134 bpm for 14 seconds.  SVT/PAT was noted with symptomatic event.  Rare isolated PACs (premature atrial contractions)-some triplets and couplets noted.  Occasional (4.6%) PVCs (future ventricular contractions) with rare couplets and triplets. There was some bigeminy (every other beat) and trigeminy (every third beat) s.  Patch Wear Time:  6 days and 21 hours (2022-03-04T20:44:50-0500 to 2022-03-11T18:43:28-0500) Notable events: 2 short bursts of VT-benign; 13 short bursts PSVT/PAT-benign.  4.6% PVCs-moderate concern, more concerning on greater than 10%. -> PT/PAT with aberrancy episodes not triggering the symptoms. Symptoms were noted with sinus rhythm intermittent premature ventricular contractions.  Several of SVT episodes were also noted. With this level of extra heartbeats, we probably could consider trying to treat with medicines.  Need to discuss side effects. Thankfully, no dangerous rhythms noted.  Although short blood pressures are pretty benign. David Harding, MD     ASSESSMENT:  1. Stage IV small lymphocytic lymphoma: -CLL FISH panel normal, Ig HV positive, T p53 negative. -Ibrutinib420 mg started on 12/01/2017, 6 cycles of rituximab from 12/14/2017 through 05/21/2018. -CT CAP on 06/30/2019 showed interval decrease in size of multiple bilateral iliac and pelvic sidewall and inguinal lymph nodes. Largest left inguinal lymph node measures 1.7 x 1.3 cm. Additional unchanged enlarged lower paraesophageal and retroperitoneal lymph nodes present. Multiple  stable likely benign small pulmonary nodules.  2.  Health maintenance: - Colonoscopy on 10/10/2020 showed multiple polyps with no bleeding areas. - EGD on 10/10/2020 showed normal esophagus, few gastric polyps, gastric erosions with no stigmata of recent bleeding.   PLAN:  1. Stage IV small lymphocytic lymphoma: -He is continuing to tolerate ibrutinib very well. - Has some easy bruising on extremities but denies any active bleeding. - Reviewed labs from 10/26/2020 which showed normal LDH.  CBC shows normal white count and platelet count improved to 144. - No palpable splenomegaly or adenopathy on examination today.  2. Mild thrombocytopenia: -Platelet count today is 144.  Prior mildly low platelet count secondary to myelosuppression and splenomegaly.  3. Mild hyperbilirubinemia: -Total bilirubin is 0.8 today.  4. Elevated creatinine: -Creatinine has improved to 1.12.  5. High risk drug monitoring: -He denies any palpitations or signs of atrial fibrillation.  6.  Normocytic anemia: - Received Venofer from 09/20/2020 - 09/29/2020, total dose 1 g. - Hemoglobin improved from 7.1-12.1.  Reviewed EGD and colonoscopy reports. - He started taking iron tablet daily since last visit.  However he had constipation since starting iron tablet.  He is currently holding off on iron as he is having capsule study done.  I have suggested him to take iron tablet every other day after that. - RTC 6 weeks with repeat ferritin, iron panel and other labs.  Orders placed this encounter:  Orders Placed This Encounter  Procedures  . CBC with Differential  . Comprehensive metabolic panel  . Lactate dehydrogenase  . Iron and TIBC  . Ferritin     Sreedhar Katragadda, MD [facility] [phone]   I, Daniel Khashchuk, am acting as a scribe for Dr. Sreedhar Katagadda.  I, Sreedhar   Katragadda MD, have reviewed the above documentation for accuracy and completeness, and I agree with  the above.     

## 2020-11-04 ENCOUNTER — Other Ambulatory Visit: Payer: Self-pay | Admitting: Cardiology

## 2020-11-06 ENCOUNTER — Other Ambulatory Visit (HOSPITAL_COMMUNITY): Payer: Self-pay

## 2020-11-06 MED FILL — Ibrutinib Tab 420 MG: ORAL | 28 days supply | Qty: 28 | Fill #0 | Status: AC

## 2020-11-07 ENCOUNTER — Ambulatory Visit (HOSPITAL_COMMUNITY)
Admission: RE | Admit: 2020-11-07 | Discharge: 2020-11-07 | Disposition: A | Discharge status: Home / Self Care | Payer: Managed Care, Other (non HMO) | Source: Ambulatory Visit | Attending: Gastroenterology | Admitting: Gastroenterology

## 2020-11-07 ENCOUNTER — Encounter (HOSPITAL_COMMUNITY)
Admission: RE | Disposition: A | Discharge status: Home / Self Care | Payer: Self-pay | Source: Ambulatory Visit | Attending: Gastroenterology

## 2020-11-07 DIAGNOSIS — K6389 Other specified diseases of intestine: Secondary | ICD-10-CM | POA: Diagnosis not present

## 2020-11-07 DIAGNOSIS — K2980 Duodenitis without bleeding: Secondary | ICD-10-CM | POA: Diagnosis not present

## 2020-11-07 DIAGNOSIS — D500 Iron deficiency anemia secondary to blood loss (chronic): Secondary | ICD-10-CM | POA: Insufficient documentation

## 2020-11-07 DIAGNOSIS — K2970 Gastritis, unspecified, without bleeding: Secondary | ICD-10-CM | POA: Diagnosis not present

## 2020-11-07 HISTORY — PX: GIVENS CAPSULE STUDY: SHX5432

## 2020-11-07 SURGERY — IMAGING PROCEDURE, GI TRACT, INTRALUMINAL, VIA CAPSULE

## 2020-11-08 ENCOUNTER — Encounter (INDEPENDENT_AMBULATORY_CARE_PROVIDER_SITE_OTHER): Payer: Self-pay

## 2020-11-08 ENCOUNTER — Other Ambulatory Visit (INDEPENDENT_AMBULATORY_CARE_PROVIDER_SITE_OTHER): Payer: Self-pay

## 2020-11-08 ENCOUNTER — Encounter (HOSPITAL_COMMUNITY): Payer: Self-pay | Admitting: Gastroenterology

## 2020-11-08 DIAGNOSIS — K6389 Other specified diseases of intestine: Secondary | ICD-10-CM

## 2020-11-08 DIAGNOSIS — D509 Iron deficiency anemia, unspecified: Secondary | ICD-10-CM

## 2020-11-08 NOTE — Procedures (Signed)
Small Bowel Givens Capsule Study Procedure date:  11/07/2020  Referring Provider: Derek Jack, MD PCP:  Dr. [name], MD  Indication for procedure: Iron deficiency anemia  Findings: Capsule reached the colon.  There was poor quality of the bowel prep in the colon and part of the distal small bowel which affected visualization of this area.  There was presence of 1 gastric erosion without any ongoing bleeding.  There was also presence of duodenitis in the duodenal bulb without active bleeding.  The proximal small bowel I was able to see scant amount of hematin in the lumen.  In the proximal small bowel, around 10 minutes of the recording started, there was presence of a possible inflammatory polyp without any active bleeding.  There were 2 lymphangiectasia's in the proximal small bowel as well.     First Gastric image:  00:02:48 First Duodenal image: 00:04:32 First Cecal image: 02:37:30 Gastric Passage time: 0h 63m Small Bowel Passage time:  2h 8m  Summary & Recommendations: There was presence of mild gastritis and duodenitis.  Notably, there was presence of polyp with inflammatory surface in the proximal small bowel without any active bleeding but scant amount of hematin was found lumen.  Given the history of lymphoma, this is concerning for metastatic disease.  We will need to evaluate this further with a push enteroscopy with possible biopsies of this lesion.  - Schedule push enteroscopy  Maylon Peppers, MD Gastroenterology and Hepatology [facility]

## 2020-11-09 ENCOUNTER — Other Ambulatory Visit (HOSPITAL_COMMUNITY): Payer: Self-pay

## 2020-11-13 ENCOUNTER — Other Ambulatory Visit: Payer: Self-pay

## 2020-11-13 ENCOUNTER — Other Ambulatory Visit (HOSPITAL_COMMUNITY)
Admission: RE | Admit: 2020-11-13 | Discharge: 2020-11-13 | Disposition: A | Discharge status: Home / Self Care | Payer: Managed Care, Other (non HMO) | Source: Ambulatory Visit | Attending: Gastroenterology | Admitting: Gastroenterology

## 2020-11-13 DIAGNOSIS — Z20822 Contact with and (suspected) exposure to covid-19: Secondary | ICD-10-CM | POA: Diagnosis not present

## 2020-11-13 DIAGNOSIS — Z01812 Encounter for preprocedural laboratory examination: Secondary | ICD-10-CM | POA: Diagnosis not present

## 2020-11-13 LAB — SARS CORONAVIRUS 2 (TAT 6-24 HRS): SARS Coronavirus 2: NEGATIVE

## 2020-11-14 ENCOUNTER — Ambulatory Visit (HOSPITAL_COMMUNITY): Payer: Managed Care, Other (non HMO) | Admitting: Anesthesiology

## 2020-11-14 ENCOUNTER — Ambulatory Visit (HOSPITAL_COMMUNITY)
Admission: RE | Admit: 2020-11-14 | Discharge: 2020-11-14 | Disposition: A | Discharge status: Home / Self Care | Payer: Managed Care, Other (non HMO) | Attending: Gastroenterology | Admitting: Gastroenterology

## 2020-11-14 ENCOUNTER — Ambulatory Visit (INDEPENDENT_AMBULATORY_CARE_PROVIDER_SITE_OTHER): Payer: Managed Care, Other (non HMO) | Admitting: Gastroenterology

## 2020-11-14 ENCOUNTER — Other Ambulatory Visit: Payer: Self-pay

## 2020-11-14 ENCOUNTER — Encounter (HOSPITAL_COMMUNITY)
Admission: RE | Disposition: A | Discharge status: Home / Self Care | Payer: Self-pay | Source: Home / Self Care | Attending: Gastroenterology

## 2020-11-14 ENCOUNTER — Encounter (HOSPITAL_COMMUNITY): Payer: Self-pay | Admitting: Gastroenterology

## 2020-11-14 DIAGNOSIS — I2510 Atherosclerotic heart disease of native coronary artery without angina pectoris: Secondary | ICD-10-CM | POA: Insufficient documentation

## 2020-11-14 DIAGNOSIS — Z7982 Long term (current) use of aspirin: Secondary | ICD-10-CM | POA: Diagnosis not present

## 2020-11-14 DIAGNOSIS — I252 Old myocardial infarction: Secondary | ICD-10-CM | POA: Diagnosis not present

## 2020-11-14 DIAGNOSIS — Z79899 Other long term (current) drug therapy: Secondary | ICD-10-CM | POA: Insufficient documentation

## 2020-11-14 DIAGNOSIS — Z87891 Personal history of nicotine dependence: Secondary | ICD-10-CM | POA: Insufficient documentation

## 2020-11-14 DIAGNOSIS — D509 Iron deficiency anemia, unspecified: Secondary | ICD-10-CM | POA: Insufficient documentation

## 2020-11-14 DIAGNOSIS — Z8572 Personal history of non-Hodgkin lymphomas: Secondary | ICD-10-CM | POA: Diagnosis not present

## 2020-11-14 DIAGNOSIS — K317 Polyp of stomach and duodenum: Secondary | ICD-10-CM

## 2020-11-14 DIAGNOSIS — K2980 Duodenitis without bleeding: Secondary | ICD-10-CM | POA: Diagnosis not present

## 2020-11-14 DIAGNOSIS — K6389 Other specified diseases of intestine: Secondary | ICD-10-CM

## 2020-11-14 HISTORY — PX: HEMOSTASIS CLIP PLACEMENT: SHX6857

## 2020-11-14 HISTORY — PX: BIOPSY: SHX5522

## 2020-11-14 HISTORY — PX: ESOPHAGOGASTRODUODENOSCOPY (EGD) WITH PROPOFOL: SHX5813

## 2020-11-14 HISTORY — PX: ENTEROSCOPY: SHX5533

## 2020-11-14 SURGERY — ESOPHAGOGASTRODUODENOSCOPY (EGD) WITH PROPOFOL
Anesthesia: General

## 2020-11-14 MED ORDER — PROPOFOL 500 MG/50ML IV EMUL
INTRAVENOUS | Status: DC | PRN
  Administered 2020-11-14: 100 ug/kg/min via INTRAVENOUS

## 2020-11-14 MED ORDER — GLUCAGON HCL RDNA (DIAGNOSTIC) 1 MG IJ SOLR
INTRAMUSCULAR | Status: DC | PRN
  Administered 2020-11-14 (×2): .5 mg via INTRAVENOUS

## 2020-11-14 MED ORDER — SPOT INK MARKER SYRINGE KIT
PACK | SUBMUCOSAL | Status: AC
  Filled 2020-11-14: qty 5

## 2020-11-14 MED ORDER — SPOT INK MARKER SYRINGE KIT
PACK | SUBMUCOSAL | Status: DC | PRN
  Administered 2020-11-14: 1 mL via SUBMUCOSAL

## 2020-11-14 MED ORDER — STERILE WATER FOR IRRIGATION IR SOLN
Status: DC | PRN
  Administered 2020-11-14: 1.5 mL

## 2020-11-14 MED ORDER — GLUCAGON HCL RDNA (DIAGNOSTIC) 1 MG IJ SOLR
INTRAMUSCULAR | Status: AC
  Filled 2020-11-14: qty 2

## 2020-11-14 MED ORDER — LIDOCAINE HCL (CARDIAC) PF 100 MG/5ML IV SOSY
PREFILLED_SYRINGE | INTRAVENOUS | Status: DC | PRN
  Administered 2020-11-14: 50 mg via INTRAVENOUS

## 2020-11-14 MED ORDER — PROPOFOL 10 MG/ML IV BOLUS
INTRAVENOUS | Status: DC | PRN
  Administered 2020-11-14 (×2): 100 mg via INTRAVENOUS

## 2020-11-14 MED ORDER — LACTATED RINGERS IV SOLN
INTRAVENOUS | Status: DC
  Administered 2020-11-14: 1000 mL via INTRAVENOUS

## 2020-11-14 NOTE — Anesthesia Postprocedure Evaluation (Signed)
Anesthesia Post Note  Patient: Adam Reyes  Procedure(s) Performed: ESOPHAGOGASTRODUODENOSCOPY (EGD) WITH PROPOFOL (N/A ) push enteroscopy (N/A ) BIOPSY HEMOSTASIS CLIP PLACEMENT  Patient location during evaluation: Endoscopy Anesthesia Type: General Level of consciousness: awake and alert and oriented Pain management: pain level controlled Vital Signs Assessment: post-procedure vital signs reviewed and stable Respiratory status: spontaneous breathing and respiratory function stable Cardiovascular status: blood pressure returned to baseline and stable Postop Assessment: no apparent nausea or vomiting Anesthetic complications: no   No complications documented.   Last Vitals:  Vitals:   11/14/20 1041 11/14/20 1302  BP: (!) 146/81 (!) 114/56  Pulse: 70 68  Resp: 12 13  Temp: 36.8 C 37.2 C  SpO2: 95% 97%    Last Pain:  Vitals:   11/14/20 1302  TempSrc: Oral  PainSc: 0-No pain                 Jaiya Mooradian C Glenis Musolf

## 2020-11-14 NOTE — Op Note (Signed)
[facility] Patient Name: Adam Reyes Procedure Date: 11/14/2020 10:59 AM MRN: [id number] Date of Birth: 12/23/1955 Attending MD: Maylon Peppers ,  CSN: [id number] Age: 64 Admit Type: Outpatient Procedure:                Small bowel enteroscopy Indications:              Iron deficiency anemia, Small bowel polyp Providers:                Maylon Peppers, Caprice Kluver, Raphael Gibney,                            Technician Referring MD:             Derek Jack, MD Medicines:                Monitored Anesthesia Care Complications:            No immediate complications. Estimated Blood Loss:     Estimated blood loss: none. Procedure:                Pre-Anesthesia Assessment:                           - Prior to the procedure, a History and Physical                            was performed, and patient medications, allergies                            and sensitivities were reviewed. The patient's                            tolerance of previous anesthesia was reviewed.                           - The risks and benefits of the procedure and the                            sedation options and risks were discussed with the                            patient. All questions were answered and informed                            consent was obtained.                           - ASA Grade Assessment: III - A patient with severe                            systemic disease.                           After obtaining informed consent, the endoscope was                            passed under direct vision. Throughout the  procedure, the patient's blood pressure, pulse, and                            oxygen saturations were monitored continuously.The                            small bowel enteroscopy was accomplished without                            difficulty. The patient tolerated the procedure                            well. The [id number]-[id number] ([id number]) scope was  introduced                            through the mouth and advanced to the proximal                            jejunum. Scope In: 12:17:13 PM Scope Out: 40:08:67 PM Total Procedure Duration: 0 hours 38 minutes 55 seconds  Findings:      The esophagus was normal.      The stomach was normal.      A single 6 mm sessile nodule with active slow oozing bleeding was found       in the fourth portion of the duodenum. Biopsies from the nodule were       taken with a cold forceps for histology. To stop active bleeding, two       hemostatic clips were successfully placed. There was no bleeding at the       end of the procedure. An area contralateral to the polyp site was       successfully injected with 1 mL Niger ink for tattooing.      There was no evidence of significant pathology in the entire examined       portion of jejunum. Impression:               - Normal esophagus.                           - Normal stomach.                           - A single bleeding duodenal nodule. Biopsied.                            Clips were placed. Injected.                           - The examined portion of the jejunum was normal. Moderate Sedation:      Per Anesthesia Care Recommendation:           - Discharge patient to home (ambulatory).                           - Resume previous diet.                           - Await  pathology results.                           - Return to clinic in 6 weeks. Procedure Code(s):        --- Professional ---                           [phone], 83, Small intestinal endoscopy, enteroscopy                            beyond second portion of duodenum, not including                            ileum; with control of bleeding (eg, injection,                            bipolar cautery, unipolar cautery, laser, heater                            probe, stapler, plasma coagulator)                           44361, Small intestinal endoscopy, enteroscopy                             beyond second portion of duodenum, not including                            ileum; with biopsy, single or multiple                           44799, Unlisted procedure, small intestine Diagnosis Code(s):        --- Professional ---                           K31.7, Polyp of stomach and duodenum                           D50.9, Iron deficiency anemia, unspecified CPT copyright 2019 American Medical Association. All rights reserved. The codes documented in this report are preliminary and upon coder review may  be revised to meet current compliance requirements. Maylon Peppers, MD Maylon Peppers,  11/14/2020 1:08:09 PM This report has been signed electronically. Number of Addenda: 0

## 2020-11-14 NOTE — Anesthesia Preprocedure Evaluation (Addendum)
Anesthesia Evaluation  Patient identified by MRN, date of birth, ID band Patient awake    Reviewed: Allergy & Precautions, NPO status , Patient's Chart, lab work & pertinent test results  History of Anesthesia Complications Negative for: history of anesthetic complications  Airway Mallampati: III  TM Distance: >3 FB Neck ROM: Full    Dental  (+) Dental Advisory Given, Teeth Intact   Pulmonary shortness of breath and with exertion, former smoker,    Pulmonary exam normal breath sounds clear to auscultation       Cardiovascular Exercise Tolerance: Good + angina with exertion + CAD (non obstructive CAD), + Past MI and + DOE  Normal cardiovascular exam+ dysrhythmias (frequent PVCs)  Rhythm:Regular Rate:Normal  EKG- NSR, Occasional pvcs   Neuro/Psych negative neurological ROS  negative psych ROS   GI/Hepatic negative GI ROS, (+)     substance abuse  alcohol use,   Endo/Other  negative endocrine ROS  Renal/GU negative Renal ROS     Musculoskeletal negative musculoskeletal ROS (+)   Abdominal   Peds  Hematology  (+) Blood dyscrasia (Chronic lymphocytic leukemia (CLL), B-cell (HCC)), anemia ,   Anesthesia Other Findings   Reproductive/Obstetrics negative OB ROS                            Anesthesia Physical Anesthesia Plan  ASA: III  Anesthesia Plan: General   Post-op Pain Management:    Induction: Intravenous  PONV Risk Score and Plan: Propofol infusion  Airway Management Planned: Nasal Cannula, Natural Airway and Simple Face Mask  Additional Equipment:   Intra-op Plan:   Post-operative Plan:   Informed Consent: I have reviewed the patients History and Physical, chart, labs and discussed the procedure including the risks, benefits and alternatives for the proposed anesthesia with the patient or authorized representative who has indicated his/her understanding and acceptance.      Dental advisory given  Plan Discussed with: CRNA and Surgeon  Anesthesia Plan Comments:         Anesthesia Quick Evaluation  

## 2020-11-14 NOTE — H&P (Signed)
Adam Reyes is an 65 y.o. male.   Chief Complaint: Small bowel polyp, iron deficiency anemia HPI: Adam Reyes a 65 y.o.malewith PMH small lymphocytic lymphoma, non critical CAD and STEMI,who presents for evaluation ofiron deficiency anemia and small bowel polyp.  Patient denies having any complaints.  Has not present any melena, hematochezia, abdominal pain, nausea, vomiting, fever, chills, abdominal distention.  Has been receiving  iron supplementation for management of recurrent iron deficiency anemia.  He recently underwent a capsule endoscopy on 11/07/2020 which showed presence of scant amount of hematin in the proximal small bowel with presence of inflammatory polyp without active bleeding.  Past Medical History:  Diagnosis Date  . 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 Enlarged lymph node    left neck  . STEMI (ST elevation myocardial infarction) ([place]) 03/05/2017   hx/notes 03/05/2017 -likely aborted anterior STEMI with 50% bifurcation LAD-Diag1.  No PCI.  Preserved EF    Past Surgical History:  Procedure Laterality Date  . BIOPSY  10/10/2020   Procedure: BIOPSY;  Surgeon: Harvel Quale, MD;  [facility];  Service: Gastroenterology;;  . COLONOSCOPY N/A 10/11/2015   Procedure: COLONOSCOPY;  Surgeon: Rogene Houston, MD;  [facility];  Service: Endoscopy;  Laterality: N/A;  10/11/2015  . COLONOSCOPY WITH PROPOFOL N/A 10/10/2020   Procedure: COLONOSCOPY WITH PROPOFOL;  Surgeon: Harvel Quale, MD;  [facility];  Service: Gastroenterology;  Laterality: N/A;  AM  . ESOPHAGOGASTRODUODENOSCOPY (EGD) WITH PROPOFOL N/A 10/10/2020   Procedure: ESOPHAGOGASTRODUODENOSCOPY (EGD) WITH PROPOFOL;  Surgeon: Harvel Quale, MD;  [facility];  Service:  Gastroenterology;  Laterality: N/A;  . GIVENS CAPSULE STUDY N/A 11/07/2020   Procedure: GIVENS CAPSULE STUDY;  Surgeon: Harvel Quale, MD;  [facility];  Service: Gastroenterology;  Laterality: N/A;  7:30 am  . Graded Exercise Tolerance Test (GXT/ETT)  05/2017   10.7 METs (9: 25 min.  Reached 103% max predicted heart rate).  No EKG findings to suggest coronary ischemia.  Negative, low risk GXT  . LEFT HEART CATH AND CORONARY ANGIOGRAPHY N/A 03/05/2017   Procedure: LEFT HEART CATH AND CORONARY ANGIOGRAPHY;  Surgeon: Leonie Man, MD;  Location: [facility];  Service: Cardiovascular: pLAD-Diag1 50-55% (non-flow-limiiting).  EF ~50-55%.  although bifurcation lesion was presumed Culprit - no PCI (not flow limiting). - Med Rx.  [name] MASS BIOPSY Left 06/27/2015   Procedure: OPEN LEFT NECK BIOPSY ;  Surgeon: Leta Baptist, MD;  Location: [facility];  Service: ENT;  Laterality: Left;  . POLYPECTOMY  10/10/2020   Procedure: POLYPECTOMY;  Surgeon: Harvel Quale, MD;  [facility];  Service: Gastroenterology;;  . REFRACTIVE SURGERY Bilateral     Family History  Problem Relation Age of Onset  . Diabetes Mother   . Cancer Father   . Cancer Brother    Social History:  reports that he has quit smoking. His smoking use included cigarettes. He quit after 2.00 years of use. He has quit using smokeless tobacco.  His smokeless tobacco use included chew and snuff. He reports current alcohol use of about 2.0 standard drinks of alcohol per week. He reports that he does not use drugs.  Allergies: No Known Allergies  Medications Prior to Admission  Medication Sig Dispense Refill  .  aspirin 81 MG chewable tablet Chew 1 tablet (81 mg total) by mouth every other day. 30 tablet 11  . atorvastatin (LIPITOR) 40 MG tablet TAKE 1 TABLET(40 MG) BY MOUTH DAILY (Patient taking differently: Take 40 mg by mouth daily.) 90 tablet 3  . cholecalciferol (VITAMIN D3) 25  MCG (1000 UNIT) tablet Take 1,000 Units by mouth daily.    [name] ibrutinib 420 MG TABS TAKE 1 TABLET (420 MG) BY MOUTH DAILY. (Patient taking differently: Take 420 mg by mouth daily.) 28 tablet 3  . metoprolol tartrate (LOPRESSOR) 25 MG tablet TAKE 1/2 TABLET(12.5 MG) BY MOUTH TWICE DAILY (Patient taking differently: Take 12.5 mg by mouth 2 (two) times daily.) 90 tablet 3  . nitroGLYCERIN (NITROSTAT) 0.4 MG SL tablet PLACE 1 TABLET UNDER THE TONGUE EVERY 5 MINUTES AS NEEDED FOR CHEST PAIN (Patient taking differently: Place 0.4 mg under the tongue every 5 (five) minutes as needed for chest pain.) 25 tablet 2  . ferrous sulfate 324 (65 Fe) MG TBEC Take 324 mg by mouth daily.      Results for orders placed or performed during the hospital encounter of 11/13/20 (from the past 48 hour(s))  SARS CORONAVIRUS 2 (TAT 6-24 HRS) Nasopharyngeal Nasopharyngeal Swab     Status: None   Collection Time: 11/13/20  8:17 AM   Specimen: Nasopharyngeal Swab  Result Value Ref Range   SARS Coronavirus 2 NEGATIVE NEGATIVE    Comment: (NOTE) SARS-CoV-2 target nucleic acids are NOT DETECTED.  The SARS-CoV-2 RNA is generally detectable in upper and lower respiratory specimens during the acute phase of infection. Negative results do not preclude SARS-CoV-2 infection, do not rule out co-infections with other pathogens, and should not be used as the sole basis for treatment or other patient management decisions. Negative results must be combined with clinical observations, patient history, and epidemiological information. The expected result is Negative.  Fact Sheet for Patients: SugarRoll.be  Fact Sheet for Healthcare Providers: https://www.woods-mathews.com/  This test is not yet approved or cleared by the [place] FDA and  has been authorized for detection and/or diagnosis of SARS-CoV-2 by FDA under an Emergency Use Authorization (EUA). This EUA will remain  in effect  (meaning this test can be used) for the duration of the COVID-19 declaration under Se ction 564(b)(1) of the Act, 21 U.S.C. section 360bbb-3(b)(1), unless the authorization is terminated or revoked sooner.  Performed at [facility], [place] [address], [place], [place] [address]    No results found.  Review of Systems  Constitutional: Negative.   HENT: Negative.   Eyes: Negative.   Respiratory: Negative.   Cardiovascular: Negative.   Gastrointestinal: Negative.   Endocrine: Negative.   Genitourinary: Negative.   Musculoskeletal: Negative.   Skin: Negative.   Allergic/Immunologic: Negative.   Neurological: Negative.   Hematological: Negative.   Psychiatric/Behavioral: Negative.     Blood pressure (!) 146/81, pulse 70, temperature 98.3 F (36.8 C), temperature source Oral, resp. rate 12, height 6\' 4"  (1.93 m), weight 121.1 kg, SpO2 95 %. Physical Exam  GENERAL: The patient is AO x3, in no acute distress. HEENT: Head is normocephalic and atraumatic. EOMI are intact. Mouth is well hydrated and without lesions. NECK: Supple. No masses LUNGS: Clear to auscultation. No presence of rhonchi/wheezing/rales. Adequate chest expansion HEART: RRR, normal s1 and s2. ABDOMEN: Soft, nontender, no guarding, no peritoneal signs, and nondistended. BS +. No masses. EXTREMITIES: Without any cyanosis, clubbing, rash, lesions or edema. NEUROLOGIC: AOx3, no focal motor deficit. SKIN: no  jaundice, no rashes  Assessment/Plan Adam Reyes a 65 y.o.malewith PMH small lymphocytic lymphoma, non critical CAD and STEMI,who presents for evaluation ofiron deficiency anemia and small bowel polyp.  We will proceed with push enteroscopy today.  Harvel Quale, MD 11/14/2020, 12:01 PM

## 2020-11-14 NOTE — Discharge Instructions (Signed)
You are being discharged to home.  Resume your previous diet.  We are waiting for your pathology results.  Return to clinic in 6 weeks.   Upper Endoscopy, Adult, Care After This sheet gives you information about how to care for yourself after your procedure. Your health care provider may also give you more specific instructions. If you have problems or questions, contact your health care provider. What can I expect after the procedure? After the procedure, it is common to have:  A sore throat.  Mild stomach pain or discomfort.  Bloating.  Nausea. Follow these instructions at home:  Follow instructions from your health care provider about what to eat or drink after your procedure.  Return to your normal activities as told by your health care provider. Ask your health care provider what activities are safe for you.  Take over-the-counter and prescription medicines only as told by your health care provider.  If you were given a sedative during the procedure, it can affect you for several hours. Do not drive or operate machinery until your health care provider says that it is safe.  Keep all follow-up visits as told by your health care provider. This is important.   Contact a health care provider if you have:  A sore throat that lasts longer than one day.  Trouble swallowing. Get help right away if:  You vomit blood or your vomit looks like coffee grounds.  You have: ? A fever. ? Bloody, black, or tarry stools. ? A severe sore throat or you cannot swallow. ? Difficulty breathing. ? Severe pain in your chest or abdomen. Summary  After the procedure, it is common to have a sore throat, mild stomach discomfort, bloating, and nausea.  If you were given a sedative during the procedure, it can affect you for several hours. Do not drive or operate machinery until your health care provider says that it is safe.  Follow instructions from your health care provider about what to  eat or drink after your procedure.  Return to your normal activities as told by your health care provider. This information is not intended to replace advice given to you by your health care provider. Make sure you discuss any questions you have with your health care provider. Document Revised: 06/29/2019 Document Reviewed: 12/01/2017 Elsevier Patient Education  2021 [employer].

## 2020-11-14 NOTE — Transfer of Care (Signed)
Immediate Anesthesia Transfer of Care Note  Patient: MASAHIRO IGLESIA  Procedure(s) Performed: ESOPHAGOGASTRODUODENOSCOPY (EGD) WITH PROPOFOL (N/A ) push enteroscopy (N/A ) BIOPSY HEMOSTASIS CLIP PLACEMENT  Patient Location: Endoscopy Unit  Anesthesia Type:General  Level of Consciousness: awake, alert  and oriented  Airway & Oxygen Therapy: Patient Spontanous Breathing  Post-op Assessment: Report given to RN and Post -op Vital signs reviewed and stable  Post vital signs: Reviewed and stable  Last Vitals:  Vitals Value Taken Time  BP 114/56 11/14/20 1302  Temp 37.2 C 11/14/20 1302  Pulse 68 11/14/20 1302  Resp 13 11/14/20 1302  SpO2 97 % 11/14/20 1302    Last Pain:  Vitals:   11/14/20 1302  TempSrc: Oral  PainSc: 0-No pain      Patients Stated Pain Goal: 8 (97/53/00 5110)  Complications: No complications documented.

## 2020-11-14 NOTE — Anesthesia Procedure Notes (Signed)
Date/Time: 11/14/2020 12:19 PM Performed by: Orlie Dakin, CRNA Pre-anesthesia Checklist: Patient identified, Emergency Drugs available, Suction available and Patient being monitored Patient Re-evaluated:Patient Re-evaluated prior to induction Oxygen Delivery Method: Nasal cannula Induction Type: IV induction Placement Confirmation: positive ETCO2

## 2020-11-15 ENCOUNTER — Other Ambulatory Visit (INDEPENDENT_AMBULATORY_CARE_PROVIDER_SITE_OTHER): Payer: Self-pay

## 2020-11-15 LAB — SURGICAL PATHOLOGY

## 2020-11-16 ENCOUNTER — Ambulatory Visit: Payer: Managed Care, Other (non HMO) | Admitting: Cardiology

## 2020-11-21 ENCOUNTER — Encounter (HOSPITAL_COMMUNITY): Payer: Self-pay | Admitting: Gastroenterology

## 2020-12-07 ENCOUNTER — Inpatient Hospital Stay (HOSPITAL_COMMUNITY): Payer: Managed Care, Other (non HMO) | Attending: Hematology

## 2020-12-07 ENCOUNTER — Other Ambulatory Visit: Payer: Self-pay

## 2020-12-07 DIAGNOSIS — D649 Anemia, unspecified: Secondary | ICD-10-CM | POA: Diagnosis not present

## 2020-12-07 DIAGNOSIS — C8300 Small cell B-cell lymphoma, unspecified site: Secondary | ICD-10-CM

## 2020-12-07 LAB — CBC WITH DIFFERENTIAL/PLATELET
Abs Immature Granulocytes: 0.03 10*3/uL (ref 0.00–0.07)
Basophils Absolute: 0.1 10*3/uL (ref 0.0–0.1)
Basophils Relative: 1 %
Eosinophils Absolute: 0.1 10*3/uL (ref 0.0–0.5)
Eosinophils Relative: 2 %
HCT: 44.4 % (ref 39.0–52.0)
Hemoglobin: 14.3 g/dL (ref 13.0–17.0)
Immature Granulocytes: 0 %
Lymphocytes Relative: 8 %
Lymphs Abs: 0.5 10*3/uL — ABNORMAL LOW (ref 0.7–4.0)
MCH: 29.7 pg (ref 26.0–34.0)
MCHC: 32.2 g/dL (ref 30.0–36.0)
MCV: 92.3 fL (ref 80.0–100.0)
Monocytes Absolute: 0.6 10*3/uL (ref 0.1–1.0)
Monocytes Relative: 9 %
Neutro Abs: 5.4 10*3/uL (ref 1.7–7.7)
Neutrophils Relative %: 80 %
Platelets: 155 10*3/uL (ref 150–400)
RBC: 4.81 MIL/uL (ref 4.22–5.81)
RDW: 14.5 % (ref 11.5–15.5)
WBC: 6.7 10*3/uL (ref 4.0–10.5)
nRBC: 0 % (ref 0.0–0.2)

## 2020-12-07 LAB — COMPREHENSIVE METABOLIC PANEL
ALT: 24 U/L (ref 0–44)
AST: 23 U/L (ref 15–41)
Albumin: 4.2 g/dL (ref 3.5–5.0)
Alkaline Phosphatase: 50 U/L (ref 38–126)
Anion gap: 6 (ref 5–15)
BUN: 19 mg/dL (ref 8–23)
CO2: 27 mmol/L (ref 22–32)
Calcium: 8.5 mg/dL — ABNORMAL LOW (ref 8.9–10.3)
Chloride: 101 mmol/L (ref 98–111)
Creatinine, Ser: 1.16 mg/dL (ref 0.61–1.24)
GFR, Estimated: >60 mL/min (ref >60)
Glucose, Bld: 144 mg/dL — ABNORMAL HIGH (ref 70–99)
Potassium: 3.1 mmol/L — ABNORMAL LOW (ref 3.5–5.1)
Sodium: 134 mmol/L — ABNORMAL LOW (ref 135–145)
Total Bilirubin: 0.9 mg/dL (ref 0.3–1.2)
Total Protein: 6.5 g/dL (ref 6.5–8.1)

## 2020-12-07 LAB — IRON AND TIBC
Iron: 61 ug/dL (ref 45–182)
Saturation Ratios: 14 % — ABNORMAL LOW (ref 17.9–39.5)
TIBC: 425 ug/dL (ref 250–450)
UIBC: 364 ug/dL

## 2020-12-07 LAB — FERRITIN: Ferritin: 21 ng/mL — ABNORMAL LOW (ref 24–336)

## 2020-12-07 LAB — LACTATE DEHYDROGENASE: LDH: 146 U/L (ref 98–192)

## 2020-12-13 NOTE — Progress Notes (Shared)
Adam Reyes [address], [place] [address]   CLINIC:  Medical Oncology/Hematology  PCP:  Adam Singer, MD [address] [place] / [place] [address]  [phone]  REASON FOR VISIT:  Follow-up for small lymphocytic lymphoma and symptomatic anemia  PRIOR THERAPY: Rituximab monthly from 01/01/2018 to 05/21/2018  CURRENT THERAPY:  Ibrutinib 420 mg daily; intermittent Feraheme last on 09/29/2020  INTERVAL HISTORY:  Adam Reyes, a 65 y.o. male, returns for routine follow-up for his small lymphocytic lymphoma and symptomatic anemia. Adam Reyes was last seen on 10/31/2020.   REVIEW OF SYSTEMS:  Review of Systems  All other systems reviewed and are negative.   PAST MEDICAL/SURGICAL HISTORY:  Past Medical History:  Diagnosis Date  . 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 Enlarged lymph node    left neck  . STEMI (ST elevation myocardial infarction) (HCC) 03/05/2017   hx/notes 03/05/2017 -likely aborted anterior STEMI with 50% bifurcation LAD-Diag1.  No PCI.  Preserved EF   Past Surgical History:  Procedure Laterality Date  . BIOPSY  10/10/2020   Procedure: BIOPSY;  Surgeon: Dolores Frame, MD;  [facility];  Service: Gastroenterology;;  . BIOPSY  11/14/2020   Procedure: BIOPSY;  Surgeon: Dolores Frame, MD;  [facility];  Service: Gastroenterology;;  . COLONOSCOPY N/A 10/11/2015   Procedure: COLONOSCOPY;  Surgeon: Malissa Hippo, MD;  [facility];  Service: Endoscopy;  Laterality: N/A;  10/11/2015  . COLONOSCOPY WITH PROPOFOL N/A 10/10/2020   Procedure: COLONOSCOPY WITH PROPOFOL;  Surgeon: Dolores Frame, MD;  [facility];  Service: Gastroenterology;  Laterality: N/A;  AM  . ENTEROSCOPY N/A 11/14/2020   Procedure: push  enteroscopy;  Surgeon: Dolores Frame, MD;  [facility];  Service: Gastroenterology;  Laterality: N/A;  . ESOPHAGOGASTRODUODENOSCOPY (EGD) WITH PROPOFOL N/A 10/10/2020   Procedure: ESOPHAGOGASTRODUODENOSCOPY (EGD) WITH PROPOFOL;  Surgeon: Dolores Frame, MD;  [facility];  Service: Gastroenterology;  Laterality: N/A;  . ESOPHAGOGASTRODUODENOSCOPY (EGD) WITH PROPOFOL N/A 11/14/2020   Procedure: ESOPHAGOGASTRODUODENOSCOPY (EGD) WITH PROPOFOL;  Surgeon: Dolores Frame, MD;  [facility];  Service: Gastroenterology;  Laterality: N/A;  12:00  . GIVENS CAPSULE STUDY N/A 11/07/2020   Procedure: GIVENS CAPSULE STUDY;  Surgeon: Dolores Frame, MD;  [facility];  Service: Gastroenterology;  Laterality: N/A;  7:30 am  . Graded Exercise Tolerance Test (GXT/ETT)  05/2017   10.7 METs (9: 25 min.  Reached 103% max predicted heart rate).  No EKG findings to suggest coronary ischemia.  Negative, low risk GXT  . HEMOSTASIS CLIP PLACEMENT  11/14/2020   Procedure: HEMOSTASIS CLIP PLACEMENT;  Surgeon: Dolores Frame, MD;  [facility];  Service: Gastroenterology;;  small bowel nodule  . LEFT HEART CATH AND CORONARY ANGIOGRAPHY N/A 03/05/2017   Procedure: LEFT HEART CATH AND CORONARY ANGIOGRAPHY;  Surgeon: Marykay Lex, MD;  Location: [facility];  Service: Cardiovascular: pLAD-Diag1 50-55% (non-flow-limiiting).  EF ~50-55%.  although bifurcation lesion was presumed Culprit - no PCI (not flow limiting). - Med Rx.  [name] MASS BIOPSY Left 06/27/2015   Procedure: OPEN LEFT NECK BIOPSY ;  Surgeon: Newman Pies, MD;  Location: [facility];  Service: ENT;  Laterality: Left;  . POLYPECTOMY  10/10/2020   Procedure: POLYPECTOMY;  Surgeon: Montez Morita, Quillian Quince, MD;  [facility];  Service: Gastroenterology;;  . REFRACTIVE SURGERY Bilateral     SOCIAL HISTORY:  Social History   Socioeconomic  History  . Marital status: Married    Spouse name: Not on file  . Number of children: Not on file  . Years of education: Not on file  . Highest education level: Not on file  Occupational History  . Not on file  Tobacco Use  . Smoking status: Former Smoker    Years: 2.00    Types: Cigarettes  . Smokeless tobacco: Former Systems developer    Types: Chew, Snuff  . Tobacco comment: 03/06/2017 "quit smoking when I was young; quit chew/snuff in ~ 2008"  Vaping Use  . Vaping Use: Never used  Substance and Sexual Activity  . Alcohol use: Yes    Alcohol/week: 2.0 standard drinks    Types: 2 Cans of beer per week  . Drug use: No  . Sexual activity: Yes  Other Topics Concern  . Not on file  Social History Narrative   Married since 2001,third.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   Social Determinants of Health   Financial Resource Strain: Low Risk   . Difficulty of Paying Living Expenses: Not hard at all  Food Insecurity: No Food Insecurity  . Worried About Charity fundraiser in the Last Year: Never true  . Ran Out of Food in the Last Year: Never true  Transportation Needs: No Transportation Needs  . Lack of Transportation (Medical): No  . Lack of Transportation (Non-Medical): No  Physical Activity: Sufficiently Active  . Days of Exercise per Week: 5 days  . Minutes of Exercise per Session: 30 min  Stress: No Stress Concern Present  . Feeling of Stress : Not at all  Social Connections: Moderately Integrated  . Frequency of Communication with Friends and Family: More than three times a week  . Frequency of Social Gatherings with Friends and Family: More than three times a week  . Attends Religious Services: More than 4 times per year  . Active Member of Clubs or Organizations: No  . Attends Archivist Meetings: Never  . Marital Status: Married  Human resources officer Violence: Not At Risk  . Fear of Current or Ex-Partner: No  . Emotionally Abused: No  . Physically Abused: No  .  Sexually Abused: No    FAMILY HISTORY:  Family History  Problem Relation Age of Onset  . Diabetes Mother   . Cancer Father   . Cancer Brother     CURRENT MEDICATIONS:  Current Outpatient Medications  Medication Sig Dispense Refill  . aspirin 81 MG chewable tablet Chew 1 tablet (81 mg total) by mouth every other day. 30 tablet 11  . atorvastatin (LIPITOR) 40 MG tablet TAKE 1 TABLET(40 MG) BY MOUTH DAILY (Patient taking differently: Take 40 mg by mouth daily.) 90 tablet 3  . cholecalciferol (VITAMIN D3) 25 MCG (1000 UNIT) tablet Take 1,000 Units by mouth daily.    . ferrous sulfate 324 (65 Fe) MG TBEC Take 324 mg by mouth daily.    [name] ibrutinib 420 MG TABS TAKE 1 TABLET (420 MG) BY MOUTH DAILY. (Patient taking differently: Take 420 mg by mouth daily.) 28 tablet 3  . metoprolol tartrate (LOPRESSOR) 25 MG tablet TAKE 1/2 TABLET(12.5 MG) BY MOUTH TWICE DAILY (Patient taking differently: Take 12.5 mg by mouth 2 (two) times daily.) 90 tablet 3  . nitroGLYCERIN (NITROSTAT) 0.4  MG SL tablet PLACE 1 TABLET UNDER THE TONGUE EVERY 5 MINUTES AS NEEDED FOR CHEST PAIN (Patient taking differently: Place 0.4 mg under the tongue every 5 (five) minutes as needed for chest pain.) 25 tablet 2   No current facility-administered medications for this visit.    ALLERGIES:  No Known Allergies  PHYSICAL EXAM:  Performance status (ECOG): 0 - Asymptomatic  There were no vitals filed for this visit. Wt Readings from Last 3 Encounters:  11/14/20 267 lb (121.1 kg)  10/31/20 256 lb 12.8 oz (116.5 kg)  10/10/20 260 lb (117.9 kg)   Physical Exam Vitals reviewed.  Constitutional:      Appearance: Normal appearance.  Cardiovascular:     Rate and Rhythm: Normal rate and regular rhythm.     Pulses: Normal pulses.     Heart sounds: Normal heart sounds.  Pulmonary:     Effort: Pulmonary effort is normal.     Breath sounds: Normal breath sounds.  Neurological:     General: No focal deficit present.     Mental  Status: He is alert and oriented to person, place, and time.  Psychiatric:        Mood and Affect: Mood normal.        Behavior: Behavior normal.     LABORATORY DATA:  I have reviewed the labs as listed.  CBC Latest Ref Rng & Units 12/07/2020 10/26/2020 09/29/2020  WBC 4.0 - 10.5 K/uL 6.7 6.8 5.9  Hemoglobin 13.0 - 17.0 g/dL 14.3 12.1(L) 9.0(L)  Hematocrit 39.0 - 52.0 % 44.4 39.1 32.3(L)  Platelets 150 - 400 K/uL 155 144(L) 218   CMP Latest Ref Rng & Units 12/07/2020 10/26/2020 09/13/2020  Glucose 70 - 99 mg/dL 144(H) 72 82  BUN 8 - 23 mg/dL $Remove'19 17 16  'vEaoPAr$ Creatinine 0.61 - 1.24 mg/dL 1.16 1.12 1.03  Sodium 135 - 145 mmol/L 134(L) 138 137  Potassium 3.5 - 5.1 mmol/L 3.1(L) 3.7 3.6  Chloride 98 - 111 mmol/L 101 104 104  CO2 22 - 32 mmol/L $RemoveB'27 24 23  'UaMepQYk$ Calcium 8.9 - 10.3 mg/dL 8.5(L) 8.7(L) 8.5(L)  Total Protein 6.5 - 8.1 g/dL 6.5 6.3(L) 6.3(L)  Total Bilirubin 0.3 - 1.2 mg/dL 0.9 0.8 0.7  Alkaline Phos 38 - 126 U/L 50 54 51  AST 15 - 41 U/L $Remo'23 19 16  'iyXoZ$ ALT 0 - 44 U/L $Remo'24 19 16      'Weugf$ Component Value Date/Time   RBC 4.81 12/07/2020 1426   MCV 92.3 12/07/2020 1426   MCH 29.7 12/07/2020 1426   MCHC 32.2 12/07/2020 1426   RDW 14.5 12/07/2020 1426   LYMPHSABS 0.5 (L) 12/07/2020 1426   MONOABS 0.6 12/07/2020 1426   EOSABS 0.1 12/07/2020 1426   BASOSABS 0.1 12/07/2020 1426    DIAGNOSTIC IMAGING:  I have independently reviewed the scans and discussed with the patient. No results found.   ASSESSMENT:  1. Stage IV small lymphocytic lymphoma: -CLL FISH panel normal, Ig HV positive, T p53 negative. -Ibrutinib420 mg started on 12/01/2017, 6 cycles of rituximab from 12/14/2017 through 05/21/2018. -CT CAP on 06/30/2019 showed interval decrease in size of multiple bilateral iliac and pelvic sidewall and inguinal lymph nodes. Largest left inguinal lymph node measures 1.7 x 1.3 cm. Additional unchanged enlarged lower paraesophageal and retroperitoneal lymph nodes present. Multiple stable likely benign  small pulmonary nodules.  2.  Health maintenance: - Colonoscopy on 10/10/2020 showed multiple polyps with no bleeding areas. - EGD on 10/10/2020 showed normal esophagus, few gastric polyps, gastric  erosions with no stigmata of recent bleeding.   PLAN:  1. Stage IV small lymphocytic lymphoma: -He is continuing to tolerate ibrutinib very well. - Has some easy bruising on extremities but denies any active bleeding. - Reviewed labs from 10/26/2020 which showed normal LDH.  CBC shows normal white count and platelet count improved to 144. - No palpable splenomegaly or adenopathy on examination today.  2. Mild thrombocytopenia: -Platelet count today is 144.  Prior mildly low platelet count secondary to myelosuppression and splenomegaly.  3. Mild hyperbilirubinemia: -Total bilirubin is 0.8 today.  4. Elevated creatinine: -Creatinine has improved to 1.12.  5. High risk drug monitoring: -He denies any palpitations or signs of atrial fibrillation.  6. Normocytic anemia: - Received Venofer from 09/20/2020 - 09/29/2020, total dose 1 g. - Hemoglobin improved from 7.1-12.1.  Reviewed EGD and colonoscopy reports. - He started taking iron tablet daily since last visit.  However he had constipation since starting iron tablet.  He is currently holding off on iron as he is having capsule study done.  I have suggested him to take iron tablet every other day after that. - RTC 6 weeks with repeat ferritin, iron panel and other labs.  Orders placed this encounter:  No orders of the defined types were placed in this encounter.    Adam Jack, MD [place] [phone]   I, Adam Reyes, am acting as a scribe for Dr. Derek Reyes.  {Add Barista Statement}

## 2020-12-14 ENCOUNTER — Inpatient Hospital Stay (HOSPITAL_COMMUNITY): Payer: Managed Care, Other (non HMO) | Admitting: Hematology

## 2020-12-14 ENCOUNTER — Other Ambulatory Visit (HOSPITAL_COMMUNITY): Payer: Self-pay

## 2020-12-14 ENCOUNTER — Encounter (HOSPITAL_COMMUNITY): Payer: Self-pay | Admitting: Hematology

## 2020-12-14 ENCOUNTER — Other Ambulatory Visit: Payer: Self-pay

## 2020-12-14 ENCOUNTER — Other Ambulatory Visit (HOSPITAL_COMMUNITY): Payer: Self-pay | Admitting: *Deleted

## 2020-12-14 DIAGNOSIS — D649 Anemia, unspecified: Secondary | ICD-10-CM

## 2020-12-14 DIAGNOSIS — C8300 Small cell B-cell lymphoma, unspecified site: Secondary | ICD-10-CM

## 2020-12-14 MED FILL — Ibrutinib Tab 420 MG: ORAL | 28 days supply | Qty: 28 | Fill #1 | Status: AC

## 2020-12-14 NOTE — Progress Notes (Signed)
TC made to prepare for virtual visit.  Only complaint today is that he has had a rash to bilateral arms, legs and trunk.  States he has had this for a few weeks.  Cannot attribute it to any contact with allergens.  Has not mentioned it to PCP.

## 2020-12-21 NOTE — Progress Notes (Signed)
[facility] 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 There also are enlarged parotid lymph nodes bilaterally. There is bilateral axillary adenopathy as well as mediastinal adenopathy. Findings are consistent with lymphoma. Biopsy recommended.    06/27/2015 Procedure   Left neck lymph node biopsy by Dr. Suszanne Conners    06/27/2015 Pathology Results   Diagnosis Lymph node for lymphoma, Left neck node for lymphoma work up - SMALL LYMPHOCYTIC LYMPHOMA.  LOW GRADE.    06/27/2015 Pathology Results   Tissue-Flow Cytometry - MONOCLONAL B CELL POPULATION IDENTIFIED. The phenotypic features are consistent with small lymphocytic lymphoma/chronic lymphocytic leukemia and correlate well with the morphology in the lymph node    Lymphoma, small lymphocytic (HCC)  10/14/2017 Initial Diagnosis   Lymphoma, small lymphocytic (HCC)    01/01/2018 - 05/21/2018 Chemotherapy   The patient had riTUXimab (RITUXAN) 900 mg in sodium chloride 0.9 % 250 mL (2.6471 mg/mL) infusion, 375 mg/m2 = 900 mg, Intravenous,  Once, 6 of 6 cycles Dose modification: 500 mg/m2 (original dose 500 mg/m2, Cycle 2, Reason: Provider Judgment) Administration: 900 mg (01/01/2018), 1,300 mg (01/29/2018), 1,300 mg (02/26/2018), 1,300 mg (03/26/2018), 1,300 mg (04/23/2018), 1,300 mg (05/21/2018)   for chemotherapy treatment.       CANCER STAGING: Cancer Staging No matching  staging information was found for the patient.  INTERVAL HISTORY:  Adam Reyes, a 65 y.o. male, returns for routine follow-up of his small lymphocytic lymphoma and symptomatic anemia. Adam Reyes was last seen on 10/31/2020.   Today he reports feeling good. He denies any bloody stool, fevers, or night sweats, and his energy levels are improved. He reports bleeding easily upon injury to his right index finger. He reports a rash bilaterally on his biceps accompanied by itching which appears over 1 month ago; it has improved and faded. This rash is also present on his left foot and ankle. He has never had a similar rash before.   REVIEW OF SYSTEMS:  Review of Systems  Constitutional:  Negative for appetite change, fatigue and fever.  Gastrointestinal:  Negative for blood in stool.  Genitourinary:  Positive for dysuria.   Skin:  Positive for itching and rash (both biceps).  Hematological:  Bruises/bleeds easily.  All other systems reviewed and are negative.  PAST MEDICAL/SURGICAL HISTORY:  Past Medical History:  Diagnosis Date   Chronic lymphocytic leukemia (CLL), B-cell (HCC)    Small Cell Lymphoma   Coronary artery disease, non-occlusive 02/2017   Cardiac cath in setting of MI: 50 and 55% bifurcation LAD-Diag1   Enlarged lymph node    left neck   STEMI (ST elevation myocardial infarction) (HCC) 03/05/2017   hx/notes 03/05/2017 -likely aborted anterior STEMI with 50% bifurcation LAD-Diag1.  No PCI.  Preserved EF   Past Surgical History:  Procedure Laterality Date   BIOPSY  10/10/2020   Procedure: BIOPSY;  Surgeon: Dolores Frame, MD;  [facility];  Service: Gastroenterology;;   BIOPSY  11/14/2020   Procedure:  BIOPSY;  Surgeon: Montez Morita, Quillian Quince, MD;  [facility];  Service: Gastroenterology;;   COLONOSCOPY N/A 10/11/2015   Procedure: COLONOSCOPY;  Surgeon: Rogene Houston, MD;  [facility];  Service: Endoscopy;  Laterality: N/A;   10/11/2015   COLONOSCOPY WITH PROPOFOL N/A 10/10/2020   Procedure: COLONOSCOPY WITH PROPOFOL;  Surgeon: Harvel Quale, MD;  [facility];  Service: Gastroenterology;  Laterality: N/A;  AM   ENTEROSCOPY N/A 11/14/2020   Procedure: push enteroscopy;  Surgeon: Harvel Quale, MD;  [facility];  Service: Gastroenterology;  Laterality: N/A;   ESOPHAGOGASTRODUODENOSCOPY (EGD) WITH PROPOFOL N/A 10/10/2020   Procedure: ESOPHAGOGASTRODUODENOSCOPY (EGD) WITH PROPOFOL;  Surgeon: Harvel Quale, MD;  [facility];  Service: Gastroenterology;  Laterality: N/A;   ESOPHAGOGASTRODUODENOSCOPY (EGD) WITH PROPOFOL N/A 11/14/2020   Procedure: ESOPHAGOGASTRODUODENOSCOPY (EGD) WITH PROPOFOL;  Surgeon: Harvel Quale, MD;  [facility];  Service: Gastroenterology;  Laterality: N/A;  12:00   GIVENS CAPSULE STUDY N/A 11/07/2020   Procedure: GIVENS CAPSULE STUDY;  Surgeon: Harvel Quale, MD;  [facility];  Service: Gastroenterology;  Laterality: N/A;  7:30 am   Graded Exercise Tolerance Test (GXT/ETT)  05/2017   10.7 METs (9: 25 min.  Reached 103% max predicted heart rate).  No EKG findings to suggest coronary ischemia.  Negative, low risk GXT   HEMOSTASIS CLIP PLACEMENT  11/14/2020   Procedure: HEMOSTASIS CLIP PLACEMENT;  Surgeon: Harvel Quale, MD;  [facility];  Service: Gastroenterology;;  small bowel nodule   LEFT HEART CATH AND CORONARY ANGIOGRAPHY N/A 03/05/2017   Procedure: LEFT HEART CATH AND CORONARY ANGIOGRAPHY;  Surgeon: Leonie Man, MD;  Location: [facility];  Service: Cardiovascular: pLAD-Diag1 50-55% (non-flow-limiiting).  EF ~50-55%.  although bifurcation lesion was presumed Culprit - no PCI (not flow limiting). - Med Rx.   MASS BIOPSY Left 06/27/2015   Procedure: OPEN LEFT NECK BIOPSY ;  Surgeon: Leta Baptist, MD;  Location: [facility];  Service: ENT;   Laterality: Left;   POLYPECTOMY  10/10/2020   Procedure: POLYPECTOMY;  Surgeon: Harvel Quale, MD;  [facility];  Service: Gastroenterology;;   REFRACTIVE SURGERY Bilateral     SOCIAL HISTORY:  Social History   Socioeconomic History   Marital status: Married    Spouse name: Not on file   Number of children: Not on file   Years of education: Not on file   Highest education level: Not on file  Occupational History   Not on file  Tobacco Use   Smoking status: Former    Years: 2.00    Pack years: 0.00    Types: Cigarettes   Smokeless tobacco: Former    Types: Chew, Snuff   Tobacco comments:    03/06/2017 "quit smoking when I was young; quit chew/snuff in ~ 2008"  Vaping Use   Vaping Use: Never used  Substance and Sexual Activity   Alcohol use: Yes    Alcohol/week: 2.0 standard drinks    Types: 2 Cans of beer per week   Drug use: No   Sexual activity: Yes  Other Topics Concern   Not on file  Social History Narrative   Married since 2001,third.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   Social Determinants of Health   Financial Resource Strain: Low Risk    Difficulty of Paying Living Expenses: Not hard at all  Food Insecurity: No Food Insecurity   Worried About  Running Out of Food in the Last Year: Never true   Ran Out of Food in the Last Year: Never true  Transportation Needs: No Transportation Needs   Lack of Transportation (Medical): No   Lack of Transportation (Non-Medical): No  Physical Activity: Sufficiently Active   Days of Exercise per Week: 5 days   Minutes of Exercise per Session: 30 min  Stress: No Stress Concern Present   Feeling of Stress : Not at all  Social Connections: Moderately Integrated   Frequency of Communication with Friends and Family: More than three times a week   Frequency of Social Gatherings with Friends and Family: More than three times a week   Attends Religious Services: More than 4 times per year   Active Member  of [employer] or Organizations: No   Attends Music therapist: Never   Marital Status: Married  Human resources officer Violence: Not At Risk   Fear of Current or Ex-Partner: No   Emotionally Abused: No   Physically Abused: No   Sexually Abused: No    FAMILY HISTORY:  Family History  Problem Relation Age of Onset   Diabetes Mother    Cancer Father    Cancer Brother     CURRENT MEDICATIONS:  Current Outpatient Medications  Medication Sig Dispense Refill   aspirin 81 MG chewable tablet Chew 1 tablet (81 mg total) by mouth every other day. 30 tablet 11   atorvastatin (LIPITOR) 40 MG tablet TAKE 1 TABLET(40 MG) BY MOUTH DAILY (Patient taking differently: Take 40 mg by mouth daily.) 90 tablet 3   cholecalciferol (VITAMIN D3) 25 MCG (1000 UNIT) tablet Take 1,000 Units by mouth daily.     ferrous sulfate 324 (65 Fe) MG TBEC Take 324 mg by mouth every other day.     ibrutinib 420 MG TABS TAKE 1 TABLET (420 MG) BY MOUTH DAILY. (Patient taking differently: Take 420 mg by mouth daily.) 28 tablet 3   metoprolol tartrate (LOPRESSOR) 25 MG tablet TAKE 1/2 TABLET(12.5 MG) BY MOUTH TWICE DAILY (Patient taking differently: Take 12.5 mg by mouth 2 (two) times daily.) 90 tablet 3   nitroGLYCERIN (NITROSTAT) 0.4 MG SL tablet PLACE 1 TABLET UNDER THE TONGUE EVERY 5 MINUTES AS NEEDED FOR CHEST PAIN (Patient taking differently: Place 0.4 mg under the tongue every 5 (five) minutes as needed for chest pain.) 25 tablet 2   No current facility-administered medications for this visit.    ALLERGIES:  No Known Allergies  PHYSICAL EXAM:  Performance status (ECOG): 0 - Asymptomatic  There were no vitals filed for this visit. Wt Readings from Last 3 Encounters:  11/14/20 267 lb (121.1 kg)  10/31/20 256 lb 12.8 oz (116.5 kg)  10/10/20 260 lb (117.9 kg)   Physical Exam Vitals reviewed.  Constitutional:      Appearance: Normal appearance.  Cardiovascular:     Rate and Rhythm: Normal rate and regular  rhythm.     Pulses: Normal pulses.     Heart sounds: Normal heart sounds.  Pulmonary:     Effort: Pulmonary effort is normal.     Breath sounds: Normal breath sounds.  Chest:  Breasts:    Right: No axillary adenopathy or supraclavicular adenopathy.     Left: No axillary adenopathy or supraclavicular adenopathy.  Abdominal:     Palpations: Abdomen is soft. There is no hepatomegaly, splenomegaly or mass.     Tenderness: There is no abdominal tenderness.  Lymphadenopathy:     Cervical: No cervical adenopathy.  Right cervical: No superficial cervical adenopathy.    Left cervical: No superficial cervical adenopathy.     Upper Body:     Right upper body: No supraclavicular, axillary or pectoral adenopathy.     Left upper body: No supraclavicular, axillary or pectoral adenopathy.  Neurological:     General: No focal deficit present.     Mental Status: He is alert and oriented to person, place, and time.  Psychiatric:        Mood and Affect: Mood normal.        Behavior: Behavior normal.     LABORATORY DATA:  I have reviewed the labs as listed.  CBC Latest Ref Rng & Units 12/07/2020 10/26/2020 09/29/2020  WBC 4.0 - 10.5 K/uL 6.7 6.8 5.9  Hemoglobin 13.0 - 17.0 g/dL 14.3 12.1(L) 9.0(L)  Hematocrit 39.0 - 52.0 % 44.4 39.1 32.3(L)  Platelets 150 - 400 K/uL 155 144(L) 218   CMP Latest Ref Rng & Units 12/07/2020 10/26/2020 09/13/2020  Glucose 70 - 99 mg/dL 144(H) 72 82  BUN 8 - 23 mg/dL $Remove'19 17 16  'oSuBHim$ Creatinine 0.61 - 1.24 mg/dL 1.16 1.12 1.03  Sodium 135 - 145 mmol/L 134(L) 138 137  Potassium 3.5 - 5.1 mmol/L 3.1(L) 3.7 3.6  Chloride 98 - 111 mmol/L 101 104 104  CO2 22 - 32 mmol/L $RemoveB'27 24 23  'tykSyLTH$ Calcium 8.9 - 10.3 mg/dL 8.5(L) 8.7(L) 8.5(L)  Total Protein 6.5 - 8.1 g/dL 6.5 6.3(L) 6.3(L)  Total Bilirubin 0.3 - 1.2 mg/dL 0.9 0.8 0.7  Alkaline Phos 38 - 126 U/L 50 54 51  AST 15 - 41 U/L $Remo'23 19 16  'EkNnr$ ALT 0 - 44 U/L $Remo'24 19 16    'uQJuR$ DIAGNOSTIC IMAGING:  I have independently reviewed the scans and  discussed with the patient. No results found.   ASSESSMENT:  1.  Stage IV small lymphocytic lymphoma: -CLL FISH panel normal, Ig HV positive, T p53 negative. -Ibrutinib 420 mg started on 12/01/2017, 6 cycles of rituximab from 12/14/2017 through 05/21/2018. -CT CAP on 06/30/2019 showed interval decrease in size of multiple bilateral iliac and pelvic sidewall and inguinal lymph nodes.  Largest left inguinal lymph node measures 1.7 x 1.3 cm.  Additional unchanged enlarged lower paraesophageal and retroperitoneal lymph nodes present.  Multiple stable likely benign small pulmonary nodules.   2.  Health maintenance: - Colonoscopy on 10/10/2020 showed multiple polyps with no bleeding areas. - EGD on 10/10/2020 showed normal esophagus, few gastric polyps, gastric erosions with no stigmata of recent bleeding.   PLAN:  1.  Stage IV small lymphocytic lymphoma: - He is continuing to tolerate ibrutinib very well. - Easy bruising on the extremities he is stable with no other bleeding issues. - Physical examination today did not reveal any palpable adenopathy or splenomegaly. - No evidence of recurrence of lymphoma.  Hence I am not recommending any imaging. - Small bowel nodules biopsy on 11/14/2020 shows peptic duodenitis with reactive atypia with no evidence of malignancy.   2.  Mild thrombocytopenia: - He had prior mild thrombocytopenia from myelosuppression. - Platelet count today is 155.   3.  Mild hyperbilirubinemia: - Total bilirubin is 0.9 today.   4.  Elevated creatinine: - Creatinine is 1.16 today.   5.  High risk drug monitoring: - Heart rate is regular.  No signs of atrial fibrillation.  No palpitations.   6.  Normocytic anemia: - Received Venofer 1 g from 09/20/2020 through 09/29/2020. - Hemoglobin improved to 14.3.  Ferritin is 21. - Push  enteroscopy on 11/14/2020 did not reveal any major bleeding sites. - Recommend taking iron tablet every other day. - I plan to repeat labs in 2 months.   If there is no improvement with oral iron therapy, will consider parenteral iron therapy again.   Orders placed this encounter:  No orders of the defined types were placed in this encounter.    Derek Jack, MD [place] [phone]   I, Thana Ates, am acting as a scribe for Dr. Derek Jack.  I, Derek Jack MD, have reviewed the above documentation for accuracy and completeness, and I agree with the above.

## 2020-12-25 ENCOUNTER — Inpatient Hospital Stay (HOSPITAL_COMMUNITY): Payer: Managed Care, Other (non HMO) | Attending: Hematology | Admitting: Hematology

## 2020-12-25 ENCOUNTER — Other Ambulatory Visit: Payer: Self-pay

## 2020-12-25 VITALS — BP 147/76 | HR 72 | Temp 97.0°F | Resp 18 | Wt 265.2 lb

## 2020-12-25 DIAGNOSIS — D649 Anemia, unspecified: Secondary | ICD-10-CM | POA: Insufficient documentation

## 2020-12-25 DIAGNOSIS — C8300 Small cell B-cell lymphoma, unspecified site: Secondary | ICD-10-CM

## 2020-12-25 DIAGNOSIS — C9110 Chronic lymphocytic leukemia of B-cell type not having achieved remission: Secondary | ICD-10-CM | POA: Diagnosis not present

## 2020-12-25 DIAGNOSIS — D509 Iron deficiency anemia, unspecified: Secondary | ICD-10-CM

## 2020-12-25 NOTE — Patient Instructions (Signed)
[facility] at [facility] Discharge Instructions  You were seen today by Dr. Katragadda. He went over your recent results. Dr. Katragadda will see you back in 2 months for labs and follow up.   Thank you for choosing [facility] at [facility] to provide your oncology and hematology care.  To afford each patient quality time with our provider, please arrive at least 15 minutes before your scheduled appointment time.   If you have a lab appointment with the Cancer Center please come in thru the Main Entrance and check in at the main information desk  You need to re-schedule your appointment should you arrive 10 or more minutes late.  We strive to give you quality time with our providers, and arriving late affects you and other patients whose appointments are after yours.  Also, if you no show three or more times for appointments you may be dismissed from the clinic at the providers discretion.     Again, thank you for choosing [facility].  Our hope is that these requests will decrease the amount of time that you wait before being seen by our physicians.       _____________________________________________________________  Should you have questions after your visit to [facility], please contact our office at [phone] between the hours of 8:00 a.m. and 4:30 p.m.  Voicemails left after 4:00 p.m. will not be returned until the following business day.  For prescription refill requests, have your pharmacy contact our office and allow 72 hours.    Cancer Center Support Programs:   > Cancer Support Group  2nd Tuesday of the month 1pm-2pm, Journey Room   

## 2021-01-01 ENCOUNTER — Telehealth (HOSPITAL_COMMUNITY): Payer: Self-pay | Admitting: Pharmacy Technician

## 2021-01-01 NOTE — Telephone Encounter (Signed)
Oral Oncology Patient Advocate Encounter   Received notification from [place] that the existing prior authorization for Imbruv[place] is due for renewal.   Renewal PA submitted through [facility] portal Case # [id number] Status is pending   [facility] will continue to follow.  [place] Patient [place] Phone [phone] Fax [phone] 01/01/2021 2:41 PM

## 2021-01-04 ENCOUNTER — Encounter (INDEPENDENT_AMBULATORY_CARE_PROVIDER_SITE_OTHER): Payer: Self-pay | Admitting: Nurse Practitioner

## 2021-01-04 ENCOUNTER — Ambulatory Visit (INDEPENDENT_AMBULATORY_CARE_PROVIDER_SITE_OTHER): Payer: Managed Care, Other (non HMO) | Admitting: Gastroenterology

## 2021-01-04 ENCOUNTER — Ambulatory Visit (INDEPENDENT_AMBULATORY_CARE_PROVIDER_SITE_OTHER): Payer: Managed Care, Other (non HMO) | Admitting: Nurse Practitioner

## 2021-01-04 ENCOUNTER — Other Ambulatory Visit: Payer: Self-pay

## 2021-01-04 VITALS — BP 132/78 | HR 72 | Temp 97.1°F | Ht 76.0 in | Wt 259.8 lb

## 2021-01-04 DIAGNOSIS — R21 Rash and other nonspecific skin eruption: Secondary | ICD-10-CM

## 2021-01-04 DIAGNOSIS — D509 Iron deficiency anemia, unspecified: Secondary | ICD-10-CM

## 2021-01-04 DIAGNOSIS — E559 Vitamin D deficiency, unspecified: Secondary | ICD-10-CM

## 2021-01-04 DIAGNOSIS — E876 Hypokalemia: Secondary | ICD-10-CM

## 2021-01-04 DIAGNOSIS — E785 Hyperlipidemia, unspecified: Secondary | ICD-10-CM

## 2021-01-04 MED ORDER — CLOTRIMAZOLE-BETAMETHASONE 1-0.05 % EX CREA: 1.0000 "application " | TOPICAL_CREAM | Freq: Two times a day (BID) | CUTANEOUS | 0 refills | Status: DC

## 2021-01-04 NOTE — Progress Notes (Signed)
Subjective:  Patient ID: Adam Reyes, male    DOB: [birth year]  Age: 66 y.o. MRN: [id number]  CC:  Chief Complaint  Patient presents with   Follow-up    Patient has a rash on both arms and left back of ankle, itches and burns, has had over a month   Rash   Hyperlipidemia   Other    Vitamin D deficiency, hypokalemia   Anemia      HPI  This patient arrives today for the above.  Rash: He has a rash on bilateral upper arms as well as near his left ankle.  He tells me that this erupted about 1 month ago.  Its been quite pruritic and he has been scratching it.  As far as he knows he has not been exposed to any skin irritants that could cause the rash.  He has not really tried any over-the-counter remedies to treat it.  Hyperlipidemia: He continues on atorvastatin and aspirin.  Last LDL was collected about 4 months ago and it was 38.  Vitamin D deficiency: He was started on 10,000 IUs of vitamin D3 daily.  Last serum check showed a vitamin D level of 24.  He is due to have this rechecked.  Anemia: He does have iron deficiency anemia and is under investigation and treatment for this currently.  He does take iron tablet 3 times a day.  Last CBC showed normal hemoglobin this was collected about 1 month ago.  Hypokalemia: About 1 month ago he had metabolic panel collected which did show hypokalemia.  He is not currently on any potassium supplement.  Past Medical History:  Diagnosis Date   Chronic lymphocytic leukemia (CLL), B-cell ([place])    Small Cell Lymphoma   Coronary artery disease, non-occlusive 02/2017   Cardiac cath in setting of MI: 65 and 55% bifurcation LAD-Diag1   Enlarged lymph node    left neck   STEMI (ST elevation myocardial infarction) ([place]) 03/05/2017   hx/notes 03/05/2017 -likely aborted anterior STEMI with 50% bifurcation LAD-Diag1.  No PCI.  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   Social History   Tobacco Use   Smoking status: Former    Years: 2.00    Pack years: 0.00    Types: Cigarettes   Smokeless tobacco: Former    Types: Chew, Snuff   Tobacco comments:    03/06/2017 "quit smoking when I was young; quit chew/snuff in ~ 2008"  Substance Use Topics   Alcohol use: Yes    Alcohol/week: 2.0 standard drinks    Types: 2 Cans of beer per week     Current Meds  Medication Sig   aspirin 81 MG chewable tablet Chew 1 tablet (81 mg total) by mouth every other day.   atorvastatin (LIPITOR) 40 MG tablet TAKE 1 TABLET(40 MG) BY MOUTH DAILY (Patient taking differently: Take 40 mg by mouth daily.)   cholecalciferol (VITAMIN D3) 25 MCG (1000 UNIT) tablet Take 10,000 Units by mouth daily.   clotrimazole-betamethasone (LOTRISONE) cream Apply 1 application topically 2 (two) times daily.   diphenhydrAMINE (BENADRYL) 25 MG tablet Take 25 mg by mouth 2 (two) times daily.   ferrous sulfate 324 (65 Fe) MG TBEC Take 324 mg by mouth every  other day.   ibrutinib 420 MG TABS TAKE 1 TABLET (420 MG) BY MOUTH DAILY. (Patient taking differently: Take 420 mg by mouth daily.)   metoprolol tartrate (LOPRESSOR) 25 MG tablet TAKE 1/2 TABLET(12.5 MG) BY MOUTH TWICE DAILY (Patient taking differently: Take 12.5 mg by mouth 2 (two) times daily.)   nitroGLYCERIN (NITROSTAT) 0.4 MG SL tablet PLACE 1 TABLET UNDER THE TONGUE EVERY 5 MINUTES AS NEEDED FOR CHEST PAIN    ROS:  Review of Systems  Constitutional:  Negative for malaise/fatigue.  Eyes:  Negative for blurred vision.  Respiratory:  Negative for shortness of breath.   Cardiovascular:  Negative for chest pain and palpitations.  Neurological:  Negative for dizziness and headaches.    Objective:   Today's Vitals: BP 132/78   Pulse 72   Temp (!) 97.1 F (36.2 C) (Temporal)   Ht $R'6\' 4"'Du$  (1.93 m)   Wt 259 lb 12.8  oz (117.8 kg)   SpO2 97%   BMI 31.62 kg/m  Vitals with BMI 01/04/2021 12/25/2020 11/14/2020  Height $Remov'6\' 4"'NXXkXQ$  - -  Weight 259 lbs 13 oz 265 lbs 3 oz -  BMI 44.31 - -  Systolic 540 086 761  Diastolic 78 76 56  Pulse 72 72 68     Physical Exam Vitals reviewed.  Constitutional:      General: He is not in acute distress.    Appearance: Normal appearance. He is not ill-appearing.  HENT:     Head: Normocephalic and atraumatic.     Right Ear: Tympanic membrane, ear canal and external ear normal.     Left Ear: Tympanic membrane, ear canal and external ear normal.  Eyes:     General: No scleral icterus.    Extraocular Movements: Extraocular movements intact.     Conjunctiva/sclera: Conjunctivae normal.     Pupils: Pupils are equal, round, and reactive to light.  Neck:     Vascular: No carotid bruit.  Cardiovascular:     Rate and Rhythm: Normal rate and regular rhythm.     Pulses: Normal pulses.     Heart sounds: Normal heart sounds.  Pulmonary:     Effort: Pulmonary effort is normal.     Breath sounds: Normal breath sounds.  Abdominal:     General: Bowel sounds are normal. There is no distension.     Palpations: There is no mass.     Tenderness: There is no abdominal tenderness.     Hernia: No hernia is present.  Musculoskeletal:        General: No swelling or tenderness.     Cervical back: Normal range of motion and neck supple. No rigidity.  Lymphadenopathy:     Cervical: No cervical adenopathy.  Skin:    General: Skin is warm and dry.          Comments: Red lines on above image indicate location of rash.  It is a macular papular rash.  He does have signs of excoriation marks probably secondary to scratching.  Neurological:     General: No focal deficit present.     Mental Status: He is alert and oriented to person, place, and time.     Cranial Nerves: No cranial nerve deficit.     Sensory: No sensory deficit.     Motor: No weakness.     Gait: Gait normal.  Psychiatric:         Mood and Affect: Mood normal.        Behavior: Behavior normal.  Judgment: Judgment normal.         Assessment and Plan   1. Rash   2. Hypokalemia   3. Vitamin D insufficiency   4. Dyslipidemia, goal LDL below 70   5. Iron deficiency anemia, unspecified iron deficiency anemia type      Plan: 1.  We will trial Lotrisone cream.  If this does not resolve the rash he will be referred to dermatology for further evaluation. 2.  We will check metabolic panel as well as magnesium level for further evaluation. 3.  We will check serum vitamin D level. 4.  He will continue taking his medications as prescribed. 5.  He will continue to follow-up with oncology and gastroenterology and continue taking his iron supplement.   Tests ordered Orders Placed This Encounter  Procedures   CMP with eGFR([facility])   Vitamin D, 25-hydroxy   Magnesium      Meds ordered this encounter  Medications   clotrimazole-betamethasone (LOTRISONE) cream    Sig: Apply 1 application topically 2 (two) times daily.    Dispense:  30 g    Refill:  0    Order Specific Question:   Supervising Provider    Answer:   [name] [[id number]]    Patient to follow-up in 3 months or sooner as needed.  Ailene Ards, NP

## 2021-01-05 ENCOUNTER — Other Ambulatory Visit (HOSPITAL_COMMUNITY): Payer: Self-pay

## 2021-01-05 LAB — COMPLETE METABOLIC PANEL WITH GFR
AG Ratio: 2.6 (calc) — ABNORMAL HIGH (ref 1.0–2.5)
ALT: 18 U/L (ref 9–46)
AST: 19 U/L (ref 10–35)
Albumin: 4.6 g/dL (ref 3.6–5.1)
Alkaline phosphatase (APISO): 55 U/L (ref 35–144)
BUN: 23 mg/dL (ref 7–25)
CO2: 28 mmol/L (ref 20–32)
Calcium: 9.5 mg/dL (ref 8.6–10.3)
Chloride: 107 mmol/L (ref 98–110)
Creat: 1.15 mg/dL (ref 0.70–1.25)
GFR, Est African American: 78 mL/min/{1.73_m2} (ref >=60)
GFR, Est Non African American: 67 mL/min/{1.73_m2} (ref >=60)
Globulin: 1.8 g/dL (calc) — ABNORMAL LOW (ref 1.9–3.7)
Glucose, Bld: 79 mg/dL (ref 65–139)
Potassium: 3.6 mmol/L (ref 3.5–5.3)
Sodium: 143 mmol/L (ref 135–146)
Total Bilirubin: 0.6 mg/dL (ref 0.2–1.2)
Total Protein: 6.4 g/dL (ref 6.1–8.1)

## 2021-01-05 LAB — MAGNESIUM: Magnesium: 2.4 mg/dL (ref 1.5–2.5)

## 2021-01-05 LAB — VITAMIN D 25 HYDROXY (VIT D DEFICIENCY, FRACTURES): Vit D, 25-Hydroxy: 76 ng/mL (ref 30–100)

## 2021-01-05 MED FILL — Ibrutinib Tab 420 MG: ORAL | 28 days supply | Qty: 28 | Fill #2 | Status: AC

## 2021-01-09 NOTE — Telephone Encounter (Signed)
Oral Oncology Patient Advocate Encounter  Prior Authorization for Adam Reyes has been approved.    PA# [id number] Effective dates: 01/04/21 through 01/01/22  Oral Oncology Clinic will continue to follow.   [place] Patient [place] Phone [phone] Fax [phone] 01/09/2021 1:28 PM

## 2021-01-10 ENCOUNTER — Other Ambulatory Visit (HOSPITAL_COMMUNITY): Payer: Self-pay

## 2021-01-24 ENCOUNTER — Other Ambulatory Visit: Payer: Self-pay

## 2021-01-24 ENCOUNTER — Ambulatory Visit (INDEPENDENT_AMBULATORY_CARE_PROVIDER_SITE_OTHER): Payer: Managed Care, Other (non HMO) | Admitting: Gastroenterology

## 2021-01-24 ENCOUNTER — Encounter (INDEPENDENT_AMBULATORY_CARE_PROVIDER_SITE_OTHER): Payer: Self-pay | Admitting: Gastroenterology

## 2021-01-24 VITALS — BP 156/88 | HR 68 | Temp 98.0°F | Ht 76.0 in | Wt 264.0 lb

## 2021-01-24 DIAGNOSIS — Z8719 Personal history of other diseases of the digestive system: Secondary | ICD-10-CM | POA: Insufficient documentation

## 2021-01-24 DIAGNOSIS — D509 Iron deficiency anemia, unspecified: Secondary | ICD-10-CM | POA: Diagnosis not present

## 2021-01-24 NOTE — Patient Instructions (Signed)
Follow with Dr. Delton Coombes in August regarding CBC recheck Continue oral iron tablets

## 2021-01-24 NOTE — Progress Notes (Signed)
Maylon Peppers, M.D. Gastroenterology & Hepatology [name] [address] [place], [place] [address]  Primary Care Physician: [name], MD [place] [id number]  I will communicate my assessment and recommendations to the referring MD via EMR.  Problems: Small bowel bleeding secondary to ibrutinib Iron deficiency anemia  History of Present Illness: Adam Reyes is a 65 y.o. 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  The patient was last seen on 10/02/2020. At that time, the patient was scheduled for an EGD and a colonoscopy and was continued on oral iron supplementation which he is taking every other day. He underwent an EGD and colonoscopy in 10/10/2020.  EGD showed small polyps in the stomach (appear to be fundic gland polyps), there was presence of few localized erosions in the gastric body and (biopsies were negative for H. pylori).  There was mild erythema in the gastric antrum.  Duodenum was normal, biopsies were negative for celiac disease.  Colonoscopy showed presence of 7 polyps in the ascending colon, sigmoid and descending colon between 4 to 8 mm in size which were resected.  2 clips were placed in the ascending colon due to persistent bleeding.  The terminal ileum was normal.  There was presence of diverticulosis.  Pathology showed 5 of the polyps were tubular adenomas and 2 were inflammatory polyps.  He was advised to have a repeat colonoscopy in 3 years but he was also advised to proceed with a capsule endoscopy.  Capsule endoscopy was performed on 11/07/2020 which showed presence of mild gastritis and duodenitis.  Notably, there was presence of polyp with inflammatory surface in the proximal small bowel without any active bleeding but scant amount of hematin was found lumen.  Given the history of lymphoma, there  was concern for metastatic disease.  He subsequently underwent a push enteroscopy on 11/14/2020, which showed a single 6 mm nodule with active slow oozing in the fourth portion of the duodenum.  Biopsies were taken from this area (pathology showed peptic duodenitis but no other alterations).  To stop the active bleeding 2 hemostatic clips were placed and an area contralateral to the polyp site was injected with [place] ink.  The patient reports that he has felt well since he underwent his last procedure.  Denies having any complaints.  Has not presented any more episodes of melena since his last procedure. The patient denies having any nausea, vomiting, fever, chills, hematochezia, melena, hematemesis, abdominal distention, abdominal pain, diarrhea, jaundice, pruritus or weight loss.  The patient saw Dr. Delton Coombes in June and he supposed to have repeat CBC and iron stores checked in August.  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  No PCI.  Preserved EF    Past Surgical History: Past Surgical History:  Procedure Laterality Date   BIOPSY  10/10/2020   Procedure: BIOPSY;  Surgeon: Harvel Quale, MD;  [facility];  Service: Gastroenterology;;  BIOPSY  11/14/2020   Procedure: BIOPSY;  Surgeon: Harvel Quale, MD;  [facility];  Service: Gastroenterology;;   COLONOSCOPY N/A 10/11/2015   Procedure: COLONOSCOPY;  Surgeon: Rogene Houston, MD;  [facility];  Service: Endoscopy;  Laterality: N/A;  10/11/2015   COLONOSCOPY  WITH PROPOFOL N/A 10/10/2020   Procedure: COLONOSCOPY WITH PROPOFOL;  Surgeon: Harvel Quale, MD;  [facility];  Service: Gastroenterology;  Laterality: N/A;  AM   ENTEROSCOPY N/A 11/14/2020   Procedure: push enteroscopy;  Surgeon: Harvel Quale, MD;  [facility];  Service: Gastroenterology;  Laterality: N/A;   ESOPHAGOGASTRODUODENOSCOPY (EGD) WITH PROPOFOL N/A 10/10/2020   Procedure: ESOPHAGOGASTRODUODENOSCOPY (EGD) WITH PROPOFOL;  Surgeon: Harvel Quale, MD;  [facility];  Service: Gastroenterology;  Laterality: N/A;   ESOPHAGOGASTRODUODENOSCOPY (EGD) WITH PROPOFOL N/A 11/14/2020   Procedure: ESOPHAGOGASTRODUODENOSCOPY (EGD) WITH PROPOFOL;  Surgeon: Harvel Quale, MD;  [facility];  Service: Gastroenterology;  Laterality: N/A;  12:00   GIVENS CAPSULE STUDY N/A 11/07/2020   Procedure: GIVENS CAPSULE STUDY;  Surgeon: Harvel Quale, MD;  [facility];  Service: Gastroenterology;  Laterality: N/A;  7:30 am   Graded Exercise Tolerance Test (GXT/ETT)  05/2017   10.7 METs (9: 25 min.  Reached 103% max predicted heart rate).  No EKG findings to suggest coronary ischemia.  Negative, low risk GXT   HEMOSTASIS CLIP PLACEMENT  11/14/2020   Procedure: HEMOSTASIS CLIP PLACEMENT;  Surgeon: Harvel Quale, MD;  [facility];  Service: Gastroenterology;;  small bowel nodule   LEFT HEART CATH AND CORONARY ANGIOGRAPHY N/A 03/05/2017   Procedure: LEFT HEART CATH AND CORONARY ANGIOGRAPHY;  Surgeon: Leonie Man, MD;  Location: [facility];  Service: Cardiovascular: pLAD-Diag1 50-55% (non-flow-limiiting).  EF ~50-55%.  although bifurcation lesion was presumed Culprit - no PCI (not flow limiting). - Med Rx.   MASS BIOPSY Left 06/27/2015   Procedure: OPEN LEFT NECK BIOPSY ;  Surgeon: Leta Baptist, MD;  Location: [facility];  Service: ENT;  Laterality: Left;   POLYPECTOMY   10/10/2020   Procedure: POLYPECTOMY;  Surgeon: Montez Morita, Quillian Quince, MD;  [facility];  Service: Gastroenterology;;   REFRACTIVE SURGERY Bilateral     Family History: Family History  Problem Relation Age of Onset   Diabetes Mother    Cancer Father    Cancer Brother     Social History: Social History   Tobacco Use  Smoking Status Former   Years: 2.00   Pack years: 0.00   Types: Cigarettes  Smokeless Tobacco Former   Types: Chew, Snuff  Tobacco Comments   03/06/2017 "quit smoking when I was young; quit chew/snuff in ~ 2008"   Social History   Substance and Sexual Activity  Alcohol Use Yes   Alcohol/week: 2.0 standard drinks   Types: 2 Cans of beer per week   Social History   Substance and Sexual Activity  Drug Use No    Allergies: No Known Allergies  Medications: Current Outpatient Medications  Medication Sig Dispense Refill   aspirin 81 MG chewable tablet Chew 1 tablet (81 mg total) by mouth every other day. 30 tablet 11   atorvastatin (LIPITOR) 40 MG tablet TAKE 1 TABLET(40 MG) BY MOUTH DAILY (Patient taking differently: Take 40 mg by mouth daily.) 90 tablet 3   cholecalciferol (VITAMIN D3) 25 MCG (1000 UNIT) tablet Take 10,000 Units by mouth daily.  ferrous sulfate 324 (65 Fe) MG TBEC Take 324 mg by mouth every other day.     ibrutinib 420 MG TABS TAKE 1 TABLET (420 MG) BY MOUTH DAILY. (Patient taking differently: Take 420 mg by mouth daily.) 28 tablet 3   metoprolol tartrate (LOPRESSOR) 25 MG tablet TAKE 1/2 TABLET(12.5 MG) BY MOUTH TWICE DAILY (Patient taking differently: Take 12.5 mg by mouth 2 (two) times daily.) 90 tablet 3   nitroGLYCERIN (NITROSTAT) 0.4 MG SL tablet PLACE 1 TABLET UNDER THE TONGUE EVERY 5 MINUTES AS NEEDED FOR CHEST PAIN 25 tablet 2   No current facility-administered medications for this visit.    Review of Systems: GENERAL: negative for malaise, night sweats HEENT: No changes in hearing or vision, no nose bleeds or  other nasal problems. NECK: Negative for lumps, goiter, pain and significant neck swelling RESPIRATORY: Negative for cough, wheezing CARDIOVASCULAR: Negative for chest pain, leg swelling, palpitations, orthopnea GI: SEE HPI MUSCULOSKELETAL: Negative for joint pain or swelling, back pain, and muscle pain. SKIN: Negative for lesions, rash PSYCH: Negative for sleep disturbance, mood disorder and recent psychosocial stressors. HEMATOLOGY Negative for prolonged bleeding, bruising easily, and swollen nodes. ENDOCRINE: Negative for cold or heat intolerance, polyuria, polydipsia and goiter. NEURO: negative for tremor, gait imbalance, syncope and seizures. The remainder of the review of systems is noncontributory.   Physical Exam: BP (!) 156/88 (BP Location: Right Arm, Patient Position: Sitting, Cuff Size: Large)   Pulse 68   Temp 98 F (36.7 C) (Oral)   Ht 6\' 4"  (1.93 m)   Wt 264 lb (119.7 kg)   BMI 32.14 kg/m  GENERAL: The patient is AO x3, in no acute distress. Obese. HEENT: Head is normocephalic and atraumatic. EOMI are intact. Mouth is well hydrated and without lesions. NECK: Supple. No masses LUNGS: Clear to auscultation. No presence of rhonchi/wheezing/rales. Adequate chest expansion HEART: RRR, normal s1 and s2. ABDOMEN: Soft, nontender, no guarding, no peritoneal signs, and nondistended. BS +. No masses. EXTREMITIES: Without any cyanosis, clubbing, rash, lesions or edema. NEUROLOGIC: AOx3, no focal motor deficit. SKIN: no jaundice, no rashes  Imaging/Labs: as above  I personally reviewed and interpreted the available labs, imaging and endoscopic files.  Impression and Plan: Adam Reyes is a 65 y.o. 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  The patient had an area of spontaneous bleeding from the 4 portion of the duodenum.  It is very likely that this was related to the use of  ibrutinib as it has been reported this medication increase the risk of bleeding from the gastrointestinal tract.  Fortunately, there was no evidence of metastatic disease involving the small bowel.  As the patient has remained asymptomatic, we will continue monitoring clinically his progress.  He will have repeat blood testing at Dr. Tomie China office.  For now he should continue taking his oral iron supplementation.  - Follow with Dr. Delton Coombes in August - Continue oral iron supplementation  All questions were answered.      Harvel Quale, MD Gastroenterology and Hepatology [facility]

## 2021-02-02 ENCOUNTER — Other Ambulatory Visit (HOSPITAL_COMMUNITY): Payer: Self-pay

## 2021-02-02 ENCOUNTER — Other Ambulatory Visit (HOSPITAL_COMMUNITY): Payer: Self-pay | Admitting: Hematology

## 2021-02-02 DIAGNOSIS — C8300 Small cell B-cell lymphoma, unspecified site: Secondary | ICD-10-CM

## 2021-02-02 MED ORDER — IMBRUVICA 420 MG PO TABS
ORAL_TABLET | ORAL | 3 refills | Status: DC
  Filled 2021-02-02: qty 28, 28d supply, fill #0
  Filled 2021-03-06: qty 28, 28d supply, fill #1
  Filled 2021-04-03: qty 28, 28d supply, fill #2
  Filled 2021-05-01: qty 28, 28d supply, fill #3

## 2021-02-02 NOTE — Telephone Encounter (Signed)
Chart reviewed. Imbruvica refilled per last office visit with Dr. Delton Coombes

## 2021-02-08 ENCOUNTER — Other Ambulatory Visit (HOSPITAL_COMMUNITY): Payer: Self-pay

## 2021-02-19 ENCOUNTER — Other Ambulatory Visit: Payer: Self-pay

## 2021-02-19 ENCOUNTER — Inpatient Hospital Stay (HOSPITAL_COMMUNITY): Payer: Managed Care, Other (non HMO) | Attending: Hematology

## 2021-02-19 DIAGNOSIS — D696 Thrombocytopenia, unspecified: Secondary | ICD-10-CM | POA: Diagnosis not present

## 2021-02-19 DIAGNOSIS — R7989 Other specified abnormal findings of blood chemistry: Secondary | ICD-10-CM | POA: Diagnosis not present

## 2021-02-19 DIAGNOSIS — D649 Anemia, unspecified: Secondary | ICD-10-CM | POA: Insufficient documentation

## 2021-02-19 DIAGNOSIS — D509 Iron deficiency anemia, unspecified: Secondary | ICD-10-CM

## 2021-02-19 DIAGNOSIS — C8581 Other specified types of non-Hodgkin lymphoma, lymph nodes of head, face, and neck: Secondary | ICD-10-CM | POA: Diagnosis not present

## 2021-02-19 DIAGNOSIS — C8300 Small cell B-cell lymphoma, unspecified site: Secondary | ICD-10-CM

## 2021-02-19 DIAGNOSIS — C9110 Chronic lymphocytic leukemia of B-cell type not having achieved remission: Secondary | ICD-10-CM | POA: Insufficient documentation

## 2021-02-19 LAB — COMPREHENSIVE METABOLIC PANEL
ALT: 31 U/L (ref 0–44)
AST: 25 U/L (ref 15–41)
Albumin: 4.4 g/dL (ref 3.5–5.0)
Alkaline Phosphatase: 58 U/L (ref 38–126)
Anion gap: 5 (ref 5–15)
BUN: 18 mg/dL (ref 8–23)
CO2: 25 mmol/L (ref 22–32)
Calcium: 9 mg/dL (ref 8.9–10.3)
Chloride: 106 mmol/L (ref 98–111)
Creatinine, Ser: 1.02 mg/dL (ref 0.61–1.24)
GFR, Estimated: >60 mL/min (ref >60)
Glucose, Bld: 79 mg/dL (ref 70–99)
Potassium: 3.6 mmol/L (ref 3.5–5.1)
Sodium: 136 mmol/L (ref 135–145)
Total Bilirubin: 1.7 mg/dL — ABNORMAL HIGH (ref 0.3–1.2)
Total Protein: 6.8 g/dL (ref 6.5–8.1)

## 2021-02-19 LAB — CBC WITH DIFFERENTIAL/PLATELET
Abs Immature Granulocytes: 0.08 10*3/uL — ABNORMAL HIGH (ref 0.00–0.07)
Basophils Absolute: 0.1 10*3/uL (ref 0.0–0.1)
Basophils Relative: 1 %
Eosinophils Absolute: 0.1 10*3/uL (ref 0.0–0.5)
Eosinophils Relative: 1 %
HCT: 47.6 % (ref 39.0–52.0)
Hemoglobin: 16.4 g/dL (ref 13.0–17.0)
Immature Granulocytes: 1 %
Lymphocytes Relative: 11 %
Lymphs Abs: 0.8 10*3/uL (ref 0.7–4.0)
MCH: 31.5 pg (ref 26.0–34.0)
MCHC: 34.5 g/dL (ref 30.0–36.0)
MCV: 91.5 fL (ref 80.0–100.0)
Monocytes Absolute: 0.9 10*3/uL (ref 0.1–1.0)
Monocytes Relative: 12 %
Neutro Abs: 5.5 10*3/uL (ref 1.7–7.7)
Neutrophils Relative %: 74 %
Platelets: 146 10*3/uL — ABNORMAL LOW (ref 150–400)
RBC: 5.2 MIL/uL (ref 4.22–5.81)
RDW: 14 % (ref 11.5–15.5)
WBC: 7.4 10*3/uL (ref 4.0–10.5)
nRBC: 0 % (ref 0.0–0.2)

## 2021-02-19 LAB — IRON AND TIBC
Iron: 127 ug/dL (ref 45–182)
Saturation Ratios: 32 % (ref 17.9–39.5)
TIBC: 394 ug/dL (ref 250–450)
UIBC: 267 ug/dL

## 2021-02-19 LAB — FERRITIN: Ferritin: 34 ng/mL (ref 24–336)

## 2021-02-19 LAB — LACTATE DEHYDROGENASE: LDH: 164 U/L (ref 98–192)

## 2021-02-20 ENCOUNTER — Other Ambulatory Visit (HOSPITAL_COMMUNITY): Payer: Managed Care, Other (non HMO)

## 2021-02-26 NOTE — Progress Notes (Signed)
[place], [place] [address]   CLINIC:  Medical Oncology/Hematology  PCP:  [name], MD (Inactive) None None   REASON FOR VISIT:  Follow-up for small lymphocytic lymphoma and symptomatic anemia  PRIOR THERAPY: Rituximab monthly from 01/01/2018 to 05/21/2018  NGS Results: not done  CURRENT THERAPY: Ibrutinib 420 mg daily; intermittent Feraheme last on 09/29/2020  BRIEF ONCOLOGIC HISTORY:  Oncology History  Chronic lymphocytic leukemia (CLL), B-cell ([place])  06/14/2015 Imaging   CT neck- Bulky adenopathy throughout the neck bilaterally. There also are enlarged parotid lymph nodes bilaterally. There is bilateral axillary adenopathy as well as mediastinal adenopathy. Findings are consistent with lymphoma. Biopsy recommended.   06/27/2015 Procedure   Left neck lymph node biopsy by Dr. Benjamine Mola   06/27/2015 Pathology Results   Diagnosis Lymph node for lymphoma, Left neck node for lymphoma work up - [place].  LOW GRADE.   06/27/2015 Pathology Results   Tissue-Flow Cytometry - MONOCLONAL B CELL POPULATION IDENTIFIED. The phenotypic features are consistent with small lymphocytic lymphoma/chronic lymphocytic leukemia and correlate well with the morphology in the lymph node   Lymphoma, small lymphocytic ([place])  10/14/2017 Initial Diagnosis   Lymphoma, small lymphocytic ([place])   01/01/2018 - 05/21/2018 Chemotherapy   The patient had riTUXimab (RITUXAN) 900 mg in sodium chloride 0.9 % 250 mL (2.6471 mg/mL) infusion, 375 mg/m2 = 900 mg, Intravenous,  Once, 6 of 6 cycles Dose modification: 500 mg/m2 (original dose 500 mg/m2, Cycle 2, Reason: Provider Judgment) Administration: 900 mg (01/01/2018), 1,300 mg (01/29/2018), 1,300 mg (02/26/2018), 1,300 mg (03/26/2018), 1,300 mg (04/23/2018), 1,300 mg (05/21/2018)   for chemotherapy treatment.       CANCER STAGING: Cancer Staging No matching staging information was found for the  patient.  INTERVAL HISTORY:  [name], a 65 y.o. male, returns for routine follow-up of his small lymphocytic lymphoma and symptomatic anemia. Erle was last seen on 12/25/20.   Today he reports feeling good. He is taking 1 iron tablet every other day and he is tolerating it well, without constipation.   REVIEW OF SYSTEMS:  Review of Systems  Constitutional:  Negative for appetite change and fatigue.  Gastrointestinal:  Negative for constipation.  All other systems reviewed and are negative.  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  No PCI.  Preserved EF   Past Surgical History:  Procedure Laterality Date   BIOPSY  10/10/2020   Procedure: BIOPSY;  Surgeon: Harvel Quale, MD;  [facility];  Service: Gastroenterology;;   BIOPSY  11/14/2020   Procedure: BIOPSY;  Surgeon: Harvel Quale, MD;  [facility];  Service: Gastroenterology;;   COLONOSCOPY N/A 10/11/2015   Procedure: COLONOSCOPY;  Surgeon: Rogene Houston, MD;  [facility];  Service: Endoscopy;  Laterality: N/A;  10/11/2015   COLONOSCOPY WITH PROPOFOL N/A 10/10/2020   Procedure: COLONOSCOPY WITH PROPOFOL;  Surgeon: Harvel Quale, MD;  [facility];  Service: Gastroenterology;  Laterality: N/A;  AM   ENTEROSCOPY N/A 11/14/2020   Procedure: push enteroscopy;  Surgeon: Montez Morita, Quillian Quince, MD;  [facility];  Service: Gastroenterology;  Laterality: N/A;   ESOPHAGOGASTRODUODENOSCOPY (EGD) WITH PROPOFOL N/A 10/10/2020   Procedure:  ESOPHAGOGASTRODUODENOSCOPY (EGD) WITH PROPOFOL;  Surgeon: Harvel Quale, MD;  [facility];  Service: Gastroenterology;  Laterality: N/A;   ESOPHAGOGASTRODUODENOSCOPY (EGD) WITH PROPOFOL N/A 11/14/2020   Procedure: ESOPHAGOGASTRODUODENOSCOPY (EGD) WITH PROPOFOL;  Surgeon: Harvel Quale, MD;  [facility];  Service: Gastroenterology;  Laterality: N/A;  12:00   GIVENS CAPSULE STUDY N/A 11/07/2020   Procedure: GIVENS CAPSULE STUDY;  Surgeon: Harvel Quale, MD;  [facility];  Service: Gastroenterology;  Laterality: N/A;  7:30 am   Graded Exercise Tolerance Test (GXT/ETT)  05/2017   10.7 METs (9: 25 min.  Reached 103% max predicted heart rate).  No EKG findings to suggest coronary ischemia.  Negative, low risk GXT   HEMOSTASIS CLIP PLACEMENT  11/14/2020   Procedure: HEMOSTASIS CLIP PLACEMENT;  Surgeon: Harvel Quale, MD;  [facility];  Service: Gastroenterology;;  small bowel nodule   LEFT HEART CATH AND CORONARY ANGIOGRAPHY N/A 03/05/2017   Procedure: LEFT HEART CATH AND CORONARY ANGIOGRAPHY;  Surgeon: Leonie Man, MD;  Location: [facility] [facility];  Service: Cardiovascular: pLAD-Diag1 50-55% (non-flow-limiiting).  EF ~50-55%.  although bifurcation lesion was presumed Culprit - no PCI (not flow limiting). - Med Rx.   MASS BIOPSY Left 06/27/2015   Procedure: OPEN LEFT NECK BIOPSY ;  Surgeon: Leta Baptist, MD;  Location: [facility];  Service: ENT;  Laterality: Left;   POLYPECTOMY  10/10/2020   Procedure: POLYPECTOMY;  Surgeon: Harvel Quale, MD;  [facility];  Service: Gastroenterology;;   REFRACTIVE SURGERY Bilateral     SOCIAL HISTORY:  Social History   Socioeconomic History   Marital status: Married    Spouse name: Not on file   Number of children: Not on file   Years of education: Not on file   Highest education level: Not on file  Occupational History   Not on file   Tobacco Use   Smoking status: Former    Years: 2.00    Types: Cigarettes   Smokeless tobacco: Former    Types: Chew, Snuff   Tobacco comments:    03/06/2017 "quit smoking when I was young; quit chew/snuff in ~ 2008"  Vaping Use   Vaping Use: Never used  Substance and Sexual Activity   Alcohol use: Yes    Alcohol/week: 2.0 standard drinks    Types: 2 Cans of beer per week   Drug use: No   Sexual activity: Yes  Other Topics Concern   Not on file  Social History Narrative   Married since 2001,third.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   Social Determinants of Health   Financial Resource Strain: Low Risk    Difficulty of Paying Living Expenses: Not hard at all  Food Insecurity: No Food Insecurity   Worried About Charity fundraiser in the Last Year: Never true   [place] in the Last Year: Never true  Transportation Needs: No Transportation Needs   Lack of Transportation (Medical): No   Lack of Transportation (Non-Medical): No  Physical Activity: Sufficiently Active   Days of Exercise per Week: 5 days   Minutes of Exercise per Session: 30 min  Stress: No Stress Concern Present   Feeling of Stress : Not at all  Social Connections: Moderately Integrated   Frequency of Communication with Friends  and Family: More than three times a week   Frequency of Social Gatherings with Friends and Family: More than three times a week   Attends Religious Services: More than 4 times per year   Active Member of [employer] or Organizations: No   Attends Music therapist: Never   Marital Status: Married  Human resources officer Violence: Not At Risk   Fear of Current or Ex-Partner: No   Emotionally Abused: No   Physically Abused: No   Sexually Abused: No    FAMILY HISTORY:  Family History  Problem Relation Age of Onset   Diabetes Mother    Cancer Father    Cancer Brother     CURRENT MEDICATIONS:  Current Outpatient Medications  Medication Sig Dispense Refill   aspirin  81 MG chewable tablet Chew 1 tablet (81 mg total) by mouth every other day. 30 tablet 11   atorvastatin (LIPITOR) 40 MG tablet TAKE 1 TABLET(40 MG) BY MOUTH DAILY (Patient taking differently: Take 40 mg by mouth daily.) 90 tablet 3   cholecalciferol (VITAMIN D3) 25 MCG (1000 UNIT) tablet Take 10,000 Units by mouth daily.     ferrous sulfate 324 (65 Fe) MG TBEC Take 324 mg by mouth every other day.     ibrutinib (IMBRUVICA) 420 MG TABS TAKE 1 TABLET (420 MG) BY MOUTH DAILY. 28 tablet 3   metoprolol tartrate (LOPRESSOR) 25 MG tablet TAKE 1/2 TABLET(12.5 MG) BY MOUTH TWICE DAILY (Patient taking differently: Take 12.5 mg by mouth 2 (two) times daily.) 90 tablet 3   nitroGLYCERIN (NITROSTAT) 0.4 MG SL tablet PLACE 1 TABLET UNDER THE TONGUE EVERY 5 MINUTES AS NEEDED FOR CHEST PAIN 25 tablet 2   No current facility-administered medications for this visit.    ALLERGIES:  No Known Allergies  PHYSICAL EXAM:  Performance status (ECOG): 0 - Asymptomatic  There were no vitals filed for this visit. Wt Readings from Last 3 Encounters:  01/24/21 264 lb (119.7 kg)  01/04/21 259 lb 12.8 oz (117.8 kg)  12/25/20 265 lb 3.2 oz (120.3 kg)   Physical Exam Vitals reviewed.  Constitutional:      Appearance: Normal appearance.  Cardiovascular:     Rate and Rhythm: Normal rate and regular rhythm.     Pulses: Normal pulses.     Heart sounds: Normal heart sounds.  Pulmonary:     Effort: Pulmonary effort is normal.     Breath sounds: Normal breath sounds.  Lymphadenopathy:     Cervical: No cervical adenopathy.     Right cervical: No superficial cervical adenopathy.    Left cervical: No superficial cervical adenopathy.     Upper Body:     Right upper body: No supraclavicular, axillary or pectoral adenopathy.     Left upper body: No supraclavicular, axillary or pectoral adenopathy.  Neurological:     General: No focal deficit present.     Mental Status: He is alert and oriented to person, place, and time.   Psychiatric:        Mood and Affect: Mood normal.        Behavior: Behavior normal.     LABORATORY DATA:  I have reviewed the labs as listed.  CBC Latest Ref Rng & Units 02/19/2021 12/07/2020 10/26/2020  WBC 4.0 - 10.5 K/uL 7.4 6.7 6.8  Hemoglobin 13.0 - 17.0 g/dL 16.4 14.3 12.1(L)  Hematocrit 39.0 - 52.0 % 47.6 44.4 39.1  Platelets 150 - 400 K/uL 146(L) 155 144(L)   CMP Latest Ref Rng & Units  02/19/2021 01/04/2021 12/07/2020  Glucose 70 - 99 mg/dL 79 79 144(H)  BUN 8 - 23 mg/dL _0 Creatinine 0.61 - 1.24 mg/dL 1.02 1.15 1.16  Sodium 135 - 145 mmol/L 136 143 134(L)  Potassium 3.5 - 5.1 mmol/L 3.6 3.6 3.1(L)  Chloride 98 - 111 mmol/L 106 107 101  CO2 22 - 32 mmol/L _1 Calcium 8.9 - 10.3 mg/dL 9.0 9.5 8.5(L)  Total Protein 6.5 - 8.1 g/dL 6.8 6.4 6.5  Total Bilirubin 0.3 - 1.2 mg/dL 1.7(H) 0.6 0.9  Alkaline Phos 38 - 126 U/L 58 - 50  AST 15 - 41 U/L _2 ALT 0 - 44 U/L _3 DIAGNOSTIC IMAGING:  I have independently reviewed the scans and discussed with the patient. No results found.   ASSESSMENT:  1.  Stage IV small lymphocytic lymphoma: -CLL FISH panel normal, Ig HV positive, T p53 negative. -Ibrutinib 420 mg started on 12/01/2017, 6 cycles of rituximab from 12/14/2017 through 05/21/2018. -CT CAP on 06/30/2019 showed interval decrease in size of multiple bilateral iliac and pelvic sidewall and inguinal lymph nodes.  Largest left inguinal lymph node measures 1.7 x 1.3 cm.  Additional unchanged enlarged lower paraesophageal and retroperitoneal lymph nodes present.  Multiple stable likely benign small pulmonary nodules.   2.  Health maintenance: - Colonoscopy on 10/10/2020 showed multiple polyps with no bleeding areas. - EGD on 10/10/2020 showed normal esophagus, few gastric polyps, gastric erosions with no stigmata of recent bleeding.   PLAN:  1.  Stage IV small lymphocytic lymphoma: - He is continuing to tolerate ibrutinib very well. - No easy bruising or  bleeding issues reported. - Physical examination today did not reveal any palpable adenopathy. - CBC shows normal white count with normal differential. - RTC 3 months for follow-up.  Plan to repeat CT CAP with contrast prior to next visit.   2.  Mild thrombocytopenia: - On and off mild thrombocytopenia from myelosuppression is stable. - Platelet count today is 146.   3.  Mild hyperbilirubinemia: - Total bilirubin is elevated at 1.7.  We will closely monitor.   4.  Elevated creatinine: - Creatinine improved to 1.02 today.   5.  High risk drug monitoring: - Heart rate remains regular with no signs of atrial fibrillation.   6.  Normocytic anemia: - Last received Venofer on 09/19/2020. - Push enteroscopy on 11/14/2020 did not reveal any major bleeding sites. - He is taking iron tablet every other day without any GI issues. - Reviewed labs from 02/19/2021.  Ferritin improved to 34 from 21.  Hemoglobin improved to 16.4 from 14.3. - We will repeat iron panel and ferritin in 3 months.   Orders placed this encounter:  No orders of the defined types were placed in this encounter.    Derek Jack, MD [place] [phone]   I, Thana Ates, am acting as a scribe for Dr. Derek Jack.  I, Derek Jack MD, have reviewed the above documentation for accuracy and completeness, and I agree with the above.

## 2021-02-27 ENCOUNTER — Inpatient Hospital Stay (HOSPITAL_COMMUNITY): Payer: Managed Care, Other (non HMO) | Admitting: Hematology

## 2021-02-27 ENCOUNTER — Other Ambulatory Visit: Payer: Self-pay

## 2021-02-27 DIAGNOSIS — D509 Iron deficiency anemia, unspecified: Secondary | ICD-10-CM | POA: Diagnosis not present

## 2021-02-27 DIAGNOSIS — C8581 Other specified types of non-Hodgkin lymphoma, lymph nodes of head, face, and neck: Secondary | ICD-10-CM | POA: Diagnosis not present

## 2021-02-27 DIAGNOSIS — C8300 Small cell B-cell lymphoma, unspecified site: Secondary | ICD-10-CM | POA: Diagnosis not present

## 2021-02-27 NOTE — Patient Instructions (Addendum)
[facility] at [facility] Discharge Instructions  You were seen today by Dr. Katragadda. He went over your recent results. Dr. Katragadda will see you back in 3 months for labs and follow up.   Thank you for choosing [facility] at [facility] to provide your oncology and hematology care.  To afford each patient quality time with our provider, please arrive at least 15 minutes before your scheduled appointment time.   If you have a lab appointment with the Cancer Center please come in thru the Main Entrance and check in at the main information desk  You need to re-schedule your appointment should you arrive 10 or more minutes late.  We strive to give you quality time with our providers, and arriving late affects you and other patients whose appointments are after yours.  Also, if you no show three or more times for appointments you may be dismissed from the clinic at the providers discretion.     Again, thank you for choosing [facility].  Our hope is that these requests will decrease the amount of time that you wait before being seen by our physicians.       _____________________________________________________________  Should you have questions after your visit to [facility], please contact our office at [phone] between the hours of 8:00 a.m. and 4:30 p.m.  Voicemails left after 4:00 p.m. will not be returned until the following business day.  For prescription refill requests, have your pharmacy contact our office and allow 72 hours.    Cancer Center Support Programs:   > Cancer Support Group  2nd Tuesday of the month 1pm-2pm, Journey Room   

## 2021-02-28 ENCOUNTER — Other Ambulatory Visit (HOSPITAL_COMMUNITY): Payer: Self-pay | Admitting: *Deleted

## 2021-02-28 DIAGNOSIS — C8300 Small cell B-cell lymphoma, unspecified site: Secondary | ICD-10-CM

## 2021-02-28 NOTE — Addendum Note (Signed)
Addended by: Renda Rolls A on: 02/28/2021 08:07 AM   Modules accepted: Orders

## 2021-03-06 ENCOUNTER — Other Ambulatory Visit (HOSPITAL_COMMUNITY): Payer: Self-pay

## 2021-03-08 ENCOUNTER — Other Ambulatory Visit (HOSPITAL_COMMUNITY): Payer: Self-pay

## 2021-04-03 ENCOUNTER — Other Ambulatory Visit (HOSPITAL_COMMUNITY): Payer: Self-pay

## 2021-04-05 ENCOUNTER — Other Ambulatory Visit (HOSPITAL_COMMUNITY): Payer: Self-pay

## 2021-04-09 ENCOUNTER — Ambulatory Visit (INDEPENDENT_AMBULATORY_CARE_PROVIDER_SITE_OTHER): Payer: Managed Care, Other (non HMO) | Admitting: Internal Medicine

## 2021-05-01 ENCOUNTER — Other Ambulatory Visit (HOSPITAL_COMMUNITY): Payer: Self-pay

## 2021-05-07 ENCOUNTER — Other Ambulatory Visit (HOSPITAL_COMMUNITY): Payer: Self-pay

## 2021-05-09 ENCOUNTER — Other Ambulatory Visit: Payer: Self-pay | Admitting: Cardiology

## 2021-05-31 ENCOUNTER — Other Ambulatory Visit (HOSPITAL_COMMUNITY): Payer: Self-pay

## 2021-05-31 ENCOUNTER — Other Ambulatory Visit: Payer: Self-pay

## 2021-05-31 ENCOUNTER — Inpatient Hospital Stay (HOSPITAL_COMMUNITY): Payer: Managed Care, Other (non HMO) | Attending: Hematology

## 2021-05-31 ENCOUNTER — Ambulatory Visit (HOSPITAL_COMMUNITY)
Admission: RE | Admit: 2021-05-31 | Discharge: 2021-05-31 | Disposition: A | Discharge status: Home / Self Care | Payer: Managed Care, Other (non HMO) | Source: Ambulatory Visit | Attending: Hematology | Admitting: Hematology

## 2021-05-31 ENCOUNTER — Other Ambulatory Visit (HOSPITAL_COMMUNITY): Payer: Self-pay | Admitting: Hematology

## 2021-05-31 DIAGNOSIS — D509 Iron deficiency anemia, unspecified: Secondary | ICD-10-CM | POA: Diagnosis present

## 2021-05-31 DIAGNOSIS — C8300 Small cell B-cell lymphoma, unspecified site: Secondary | ICD-10-CM

## 2021-05-31 LAB — COMPREHENSIVE METABOLIC PANEL
ALT: 32 U/L (ref 0–44)
AST: 28 U/L (ref 15–41)
Albumin: 4.1 g/dL (ref 3.5–5.0)
Alkaline Phosphatase: 56 U/L (ref 38–126)
Anion gap: 7 (ref 5–15)
BUN: 20 mg/dL (ref 8–23)
CO2: 26 mmol/L (ref 22–32)
Calcium: 9.5 mg/dL (ref 8.9–10.3)
Chloride: 107 mmol/L (ref 98–111)
Creatinine, Ser: 0.98 mg/dL (ref 0.61–1.24)
GFR, Estimated: >60 mL/min (ref >60)
Glucose, Bld: 80 mg/dL (ref 70–99)
Potassium: 3.8 mmol/L (ref 3.5–5.1)
Sodium: 140 mmol/L (ref 135–145)
Total Bilirubin: 1.3 mg/dL — ABNORMAL HIGH (ref 0.3–1.2)
Total Protein: 6.6 g/dL (ref 6.5–8.1)

## 2021-05-31 LAB — CBC WITH DIFFERENTIAL/PLATELET
Abs Immature Granulocytes: 0.11 10*3/uL — ABNORMAL HIGH (ref 0.00–0.07)
Basophils Absolute: 0.1 10*3/uL (ref 0.0–0.1)
Basophils Relative: 1 %
Eosinophils Absolute: 0.1 10*3/uL (ref 0.0–0.5)
Eosinophils Relative: 1 %
HCT: 48.3 % (ref 39.0–52.0)
Hemoglobin: 16.7 g/dL (ref 13.0–17.0)
Immature Granulocytes: 1 %
Lymphocytes Relative: 8 %
Lymphs Abs: 0.6 10*3/uL — ABNORMAL LOW (ref 0.7–4.0)
MCH: 32.4 pg (ref 26.0–34.0)
MCHC: 34.6 g/dL (ref 30.0–36.0)
MCV: 93.6 fL (ref 80.0–100.0)
Monocytes Absolute: 0.8 10*3/uL (ref 0.1–1.0)
Monocytes Relative: 10 %
Neutro Abs: 6.1 10*3/uL (ref 1.7–7.7)
Neutrophils Relative %: 79 %
Platelets: 139 10*3/uL — ABNORMAL LOW (ref 150–400)
RBC: 5.16 MIL/uL (ref 4.22–5.81)
RDW: 11.9 % (ref 11.5–15.5)
WBC: 7.7 10*3/uL (ref 4.0–10.5)
nRBC: 0 % (ref 0.0–0.2)

## 2021-05-31 LAB — IRON AND TIBC
Iron: 174 ug/dL (ref 45–182)
Saturation Ratios: 47 % — ABNORMAL HIGH (ref 17.9–39.5)
TIBC: 366 ug/dL (ref 250–450)
UIBC: 192 ug/dL

## 2021-05-31 LAB — LACTATE DEHYDROGENASE: LDH: 176 U/L (ref 98–192)

## 2021-05-31 LAB — FERRITIN: Ferritin: 28 ng/mL (ref 24–336)

## 2021-05-31 MED ORDER — IMBRUVICA 420 MG PO TABS
ORAL_TABLET | ORAL | 3 refills | Status: DC
  Filled 2021-05-31: qty 28, 28d supply, fill #0
  Filled 2021-06-28: qty 28, 28d supply, fill #1
  Filled 2021-07-26: qty 28, 28d supply, fill #2
  Filled 2021-08-21: qty 28, 28d supply, fill #3

## 2021-05-31 MED ORDER — IOHEXOL 300 MG/ML  SOLN
100.0000 mL | Freq: Once | INTRAMUSCULAR | Status: AC | PRN
  Administered 2021-05-31: 14:00:00 100 mL via INTRAVENOUS

## 2021-06-01 ENCOUNTER — Other Ambulatory Visit (HOSPITAL_COMMUNITY): Payer: Self-pay

## 2021-06-05 ENCOUNTER — Other Ambulatory Visit (HOSPITAL_COMMUNITY): Payer: Self-pay

## 2021-06-06 ENCOUNTER — Ambulatory Visit (HOSPITAL_COMMUNITY): Payer: Managed Care, Other (non HMO) | Admitting: Hematology

## 2021-06-08 ENCOUNTER — Other Ambulatory Visit (HOSPITAL_COMMUNITY): Payer: Self-pay

## 2021-06-10 NOTE — Progress Notes (Signed)
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 There also are enlarged parotid lymph nodes bilaterally. There is bilateral axillary adenopathy as well as mediastinal adenopathy. Findings are consistent with lymphoma. Biopsy recommended.   06/27/2015 Procedure   Left neck lymph node biopsy by Dr. Benjamine Mola   06/27/2015 Pathology Results   Diagnosis Lymph node for lymphoma, Left neck node for lymphoma work up - [place].  LOW GRADE.   06/27/2015 Pathology Results   Tissue-Flow Cytometry - MONOCLONAL B CELL POPULATION IDENTIFIED. The phenotypic features are consistent with small lymphocytic lymphoma/chronic lymphocytic leukemia and correlate well with the morphology in the lymph node   Lymphoma, small lymphocytic ([place])  10/14/2017 Initial Diagnosis   Lymphoma, small lymphocytic ([place])   01/01/2018 - 05/21/2018 Chemotherapy   The patient had riTUXimab (RITUXAN) 900 mg in sodium chloride 0.9 % 250 mL (2.6471 mg/mL) infusion, 375 mg/m2 = 900 mg, Intravenous,  Once, 6 of 6 cycles Dose modification: 500 mg/m2 (original dose 500 mg/m2, Cycle 2, Reason: Provider Judgment) Administration: 900 mg (01/01/2018), 1,300 mg (01/29/2018), 1,300 mg (02/26/2018), 1,300 mg (03/26/2018), 1,300 mg (04/23/2018), 1,300 mg (05/21/2018)   for chemotherapy treatment.       CANCER STAGING: Cancer Staging  No matching staging information was found for the  patient.  INTERVAL HISTORY:  Adam Reyes, a 65 y.o. male, returns for routine follow-up of his small lymphocytic lymphoma and symptomatic anemia. Adam Reyes was last seen on 02/27/2021.   Today he reports feeling good. He reports good energy levels. He denies any current bleeding, fevers, night sweats, and weight loss. He reports a raised spot on the back of his left ankle which occasionally bleeds when scratched.   REVIEW OF SYSTEMS:  Review of Systems  Constitutional:  Negative for appetite change, fatigue (75%), fever and unexpected weight change.  HENT:   Negative for nosebleeds.   Respiratory:  Negative for hemoptysis.   Genitourinary:  Negative for hematuria.   Hematological:  Does not bruise/bleed easily.  All other systems reviewed and are negative.  PAST MEDICAL/SURGICAL HISTORY:  Past Medical History:  Diagnosis Date   Chronic lymphocytic leukemia (CLL), B-cell ([place])    Small Cell Lymphoma   Coronary artery disease, non-occlusive 02/2017   Cardiac cath in setting of MI: 23 and 55% bifurcation LAD-Diag1   Enlarged lymph node    left neck   STEMI (ST elevation myocardial infarction) ([place]) 03/05/2017   hx/notes 03/05/2017 -likely aborted anterior STEMI with 50% bifurcation LAD-Diag1.  No PCI.  Preserved EF   Past Surgical History:  Procedure Laterality Date   BIOPSY  10/10/2020   Procedure: BIOPSY;  Surgeon: Harvel Quale, MD;  [facility];  Service: Gastroenterology;;   BIOPSY  11/14/2020   Procedure: BIOPSY;  Surgeon: Harvel Quale, MD;  [facility];  Service: Gastroenterology;;   COLONOSCOPY N/A 10/11/2015   Procedure: COLONOSCOPY;  Surgeon: Rogene Houston, MD;  [facility];  Service: Endoscopy;  Laterality: N/A;  10/11/2015   COLONOSCOPY WITH PROPOFOL N/A 10/10/2020   Procedure: COLONOSCOPY WITH PROPOFOL;  Surgeon: Harvel Quale, MD;  [facility];  Service: Gastroenterology;  Laterality:  N/A;  AM   ENTEROSCOPY N/A 11/14/2020   Procedure: push enteroscopy;  Surgeon: Harvel Quale, MD;  [facility];  Service: Gastroenterology;  Laterality: N/A;   ESOPHAGOGASTRODUODENOSCOPY (EGD) WITH PROPOFOL N/A 10/10/2020   Procedure: ESOPHAGOGASTRODUODENOSCOPY (EGD) WITH PROPOFOL;  Surgeon: Harvel Quale, MD;  [facility];  Service: Gastroenterology;  Laterality: N/A;   ESOPHAGOGASTRODUODENOSCOPY (EGD) WITH PROPOFOL N/A 11/14/2020   Procedure: ESOPHAGOGASTRODUODENOSCOPY (EGD) WITH PROPOFOL;  Surgeon: Harvel Quale, MD;  [facility];  Service: Gastroenterology;  Laterality: N/A;  12:00   GIVENS CAPSULE STUDY N/A 11/07/2020   Procedure: GIVENS CAPSULE STUDY;  Surgeon: Harvel Quale, MD;  [facility];  Service: Gastroenterology;  Laterality: N/A;  7:30 am   Graded Exercise Tolerance Test (GXT/ETT)  05/2017   10.7 METs (9: 25 min.  Reached 103% max predicted heart rate).  No EKG findings to suggest coronary ischemia.  Negative, low risk GXT   HEMOSTASIS CLIP PLACEMENT  11/14/2020   Procedure: HEMOSTASIS CLIP PLACEMENT;  Surgeon: Harvel Quale, MD;  [facility];  Service: Gastroenterology;;  small bowel nodule   LEFT HEART CATH AND CORONARY ANGIOGRAPHY N/A 03/05/2017   Procedure: LEFT HEART CATH AND CORONARY ANGIOGRAPHY;  Surgeon: Leonie Man, MD;  Location: [facility];  Service: Cardiovascular: pLAD-Diag1 50-55% (non-flow-limiiting).  EF ~50-55%.  although bifurcation lesion was presumed Culprit - no PCI (not flow limiting). - Med Rx.   MASS BIOPSY Left 06/27/2015   Procedure: OPEN LEFT NECK BIOPSY ;  Surgeon: Leta Baptist, MD;  Location: [facility];  Service: ENT;  Laterality: Left;   POLYPECTOMY  10/10/2020   Procedure: POLYPECTOMY;  Surgeon: Harvel Quale, MD;  [facility];  Service: Gastroenterology;;   REFRACTIVE SURGERY Bilateral      SOCIAL HISTORY:  Social History   Socioeconomic History   Marital status: Married    Spouse name: Not on file   Number of children: Not on file   Years of education: Not on file   Highest education level: Not on file  Occupational History   Not on file  Tobacco Use   Smoking status: Former    Years: 2.00    Types: Cigarettes   Smokeless tobacco: Former    Types: Chew, Snuff   Tobacco comments:    03/06/2017 "quit smoking when I was young; quit chew/snuff in ~ 2008"  Vaping Use   Vaping Use: Never used  Substance and Sexual Activity   Alcohol use: Yes    Alcohol/week: 2.0 standard drinks    Types: 2 Cans of beer per week   Drug use: No   Sexual activity: Yes  Other Topics Concern   Not on file  Social History Narrative   Married since 2001,third.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   Social Determinants of Health   Financial Resource Strain: Not on file  Food Insecurity: Not on file  Transportation Needs: Not on file  Physical Activity: Not on file  Stress: Not on file  Social Connections: Not on file  Intimate Partner Violence: Not on file    FAMILY HISTORY:  Family History  Problem Relation Age of Onset   Diabetes Mother    Cancer Father    Cancer Brother     CURRENT  MEDICATIONS:  Current Outpatient Medications  Medication Sig Dispense Refill   aspirin 81 MG chewable tablet Chew 1 tablet (81 mg total) by mouth every other day. 30 tablet 11   atorvastatin (LIPITOR) 40 MG tablet TAKE 1 TABLET(40 MG) BY MOUTH DAILY 90 tablet 3   cholecalciferol (VITAMIN D3) 25 MCG (1000 UNIT) tablet Take 10,000 Units by mouth daily.     ferrous sulfate 324 (65 Fe) MG TBEC Take 324 mg by mouth every other day.     ibrutinib (IMBRUVICA) 420 MG TABS TAKE 1 TABLET (420 MG) BY MOUTH DAILY. 28 tablet 3   metoprolol tartrate (LOPRESSOR) 25 MG tablet TAKE 1/2 TABLET(12.5 MG) BY MOUTH TWICE DAILY 90 tablet 3   nitroGLYCERIN (NITROSTAT) 0.4 MG SL tablet PLACE 1 TABLET UNDER THE  TONGUE EVERY 5 MINUTES AS NEEDED FOR CHEST PAIN (Patient not taking: Reported on 02/27/2021) 25 tablet 2   No current facility-administered medications for this visit.    ALLERGIES:  No Known Allergies  PHYSICAL EXAM:  Performance status (ECOG): 0 - Asymptomatic  There were no vitals filed for this visit. Wt Readings from Last 3 Encounters:  01/24/21 264 lb (119.7 kg)  01/04/21 259 lb 12.8 oz (117.8 kg)  12/25/20 265 lb 3.2 oz (120.3 kg)   Physical Exam Vitals reviewed.  Constitutional:      Appearance: Normal appearance. He is obese.  Cardiovascular:     Rate and Rhythm: Normal rate and regular rhythm.     Pulses: Normal pulses.     Heart sounds: Normal heart sounds.  Pulmonary:     Effort: Pulmonary effort is normal.     Breath sounds: Normal breath sounds.  Skin:      Neurological:     General: No focal deficit present.     Mental Status: He is alert and oriented to person, place, and time.  Psychiatric:        Mood and Affect: Mood normal.        Behavior: Behavior normal.     LABORATORY DATA:  I have reviewed the labs as listed.  CBC Latest Ref Rng & Units 05/31/2021 02/19/2021 12/07/2020  WBC 4.0 - 10.5 K/uL 7.7 7.4 6.7  Hemoglobin 13.0 - 17.0 g/dL 16.7 16.4 14.3  Hematocrit 39.0 - 52.0 % 48.3 47.6 44.4  Platelets 150 - 400 K/uL 139(L) 146(L) 155   CMP Latest Ref Rng & Units 05/31/2021 02/19/2021 01/04/2021  Glucose 70 - 99 mg/dL 80 79 79  BUN 8 - 23 mg/dL $Remove'20 18 23  'rwqNFHk$ Creatinine 0.61 - 1.24 mg/dL 0.98 1.02 1.15  Sodium 135 - 145 mmol/L 140 136 143  Potassium 3.5 - 5.1 mmol/L 3.8 3.6 3.6  Chloride 98 - 111 mmol/L 107 106 107  CO2 22 - 32 mmol/L $RemoveB'26 25 28  'vOuTAila$ Calcium 8.9 - 10.3 mg/dL 9.5 9.0 9.5  Total Protein 6.5 - 8.1 g/dL 6.6 6.8 6.4  Total Bilirubin 0.3 - 1.2 mg/dL 1.3(H) 1.7(H) 0.6  Alkaline Phos 38 - 126 U/L 56 58 -  AST 15 - 41 U/L $Remo'28 25 19  'COEAQ$ ALT 0 - 44 U/L 32 31 18    DIAGNOSTIC IMAGING:  I have independently reviewed the scans and discussed with the  patient. CT CHEST ABDOMEN PELVIS W CONTRAST  Result Date: 06/01/2021 CLINICAL DATA:  CLL surveillance, ongoing chemotherapy EXAM: CT CHEST, ABDOMEN, AND PELVIS WITH CONTRAST TECHNIQUE: Multidetector CT imaging of the chest, abdomen and pelvis was performed following the standard protocol during bolus administration of intravenous contrast.  CONTRAST:  OMNIPAQUE IOHEXOL 300 MG/ML SOLN, additional oral enteric contrast COMPARISON:  05/11/2020 FINDINGS: CT CHEST FINDINGS Cardiovascular: No significant vascular findings. Mild cardiomegaly. Left coronary artery calcifications. No pericardial effusion. Mediastinum/Nodes: Newly enlarged pretracheal, right hilar, and subcarinal lymph nodes, subcarinal nodes measuring approximally 3.7 x 2.5 cm (series 2, image 32). Unchanged enlarged periaortic lymph nodes measuring up to 2.3 x 1.8 cm (series 2, image 4). Thyroid gland, trachea, and esophagus demonstrate no significant findings. Lungs/Pleura: Lungs are clear. No pleural effusion or pneumothorax. Musculoskeletal: No chest wall mass or suspicious bone lesions identified. CT ABDOMEN PELVIS FINDINGS Hepatobiliary: No solid liver abnormality is seen. No gallstones, gallbladder wall thickening, or biliary dilatation. Pancreas: Unremarkable. No pancreatic ductal dilatation or surrounding inflammatory changes. Spleen: Mild splenomegaly, maximum coronal span 13.8 cm, not significantly changed Adrenals/Urinary Tract: Adrenal glands are unremarkable. Kidneys are normal, without renal calculi, solid lesion, or hydronephrosis. Bladder is unremarkable. Stomach/Bowel: Stomach is within normal limits. Appendix appears normal. No evidence of bowel wall thickening, distention, or inflammatory changes. Descending and sigmoid diverticulosis. Vascular/Lymphatic: Aortic atherosclerosis. Newly enlarged left iliac and pelvic sidewall lymph nodes, largest left pelvic sidewall node imaging in the bulk to 3.4 x 1.7 cm (series 2, image 20).  Additional prominent retroperitoneal nodes are unchanged, largest retroperitoneal node measuring up to 1.4 x 0.8 cm (series 2, image 84). Reproductive: Prostatomegaly. Other: No abdominal wall hernia or abnormality. No abdominopelvic ascites. Musculoskeletal: No acute or significant osseous findings. Chronic bilateral pars defects of L5. IMPRESSION: 1. Newly enlarged pretracheal, right hilar, and subcarinal lymph nodes, as well as left iliac and pelvic sidewall lymph nodes, concerning for recurrent lymphoma. 2. Additional thoracic periaortic and retroperitoneal lymph nodes are unchanged. 3. Mild, unchanged splenomegaly. 4. Prostatomegaly. 5. Coronary artery disease. Aortic Atherosclerosis (ICD10-I70.0). Electronically Signed   By: Jearld Lesch M.D.   On: 06/01/2021 14:17     ASSESSMENT:  1.  Stage IV small lymphocytic lymphoma: -CLL FISH panel normal, Ig HV positive, T p53 negative. -Ibrutinib 420 mg started on 12/01/2017, 6 cycles of rituximab from 12/14/2017 through 05/21/2018. -CT CAP on 06/30/2019 showed interval decrease in size of multiple bilateral iliac and pelvic sidewall and inguinal lymph nodes.  Largest left inguinal lymph node measures 1.7 x 1.3 cm.  Additional unchanged enlarged lower paraesophageal and retroperitoneal lymph nodes present.  Multiple stable likely benign small pulmonary nodules.   2.  Health maintenance: - Colonoscopy on 10/10/2020 showed multiple polyps with no bleeding areas. - EGD on 10/10/2020 showed normal esophagus, few gastric polyps, gastric erosions with no stigmata of recent bleeding. - Push enteroscopy on 11/14/2020 did not reveal any bleeding issues.   PLAN:  1.  Stage IV small lymphocytic lymphoma: - He is continuing Ibrutinib without any missing doses. - We reviewed CT CAP from 06/01/2021 which showed newly enlarged pretracheal, right hilar, subcarinal lymph nodes and left iliac and pelvic sidewall lymph nodes.  Additional thoracic periaortic and retroperitoneal  lymph nodes are unchanged.  Mild unchanged splenomegaly. - His white count is normal at 7.7 with normal differential. - Looks like he is starting to progress on Ibrutinib. - We will send CLL FISH panel.  We will consider switching him to venetoclax with or without obinutuzumab. - He has tiny erythematous nodule on the left Achilles tendon.  If it does not heal, recommend biopsy. - RTC 2 weeks for follow-up.   2.  Mild thrombocytopenia: - On and off mild thrombocytopenia from myelosuppression is stable.  Platelet count today is 139.  3.  Mild hyperbilirubinemia: - Total bilirubin is mildly elevated at 1.3.  We will closely monitor.   4.  Elevated creatinine: - Creatinine improved to 0.98 today.   5.  High risk drug monitoring: - Heart rate remains regular with no signs of atrial fibrillation.   6.  Normocytic anemia: - Last Venofer on 09/19/2020. - He is taking iron tablet every other day. - Reviewed labs from 05/31/2021 with hemoglobin 16.7.  Ferritin is 28.  No indication for parenteral iron therapy.   Orders placed this encounter:  No orders of the defined types were placed in this encounter.    Derek Jack, MD [place] [phone]   I, Thana Ates, am acting as a scribe for Dr. Derek Jack.  I, Derek Jack MD, have reviewed the above documentation for accuracy and completeness, and I agree with the above.

## 2021-06-11 ENCOUNTER — Inpatient Hospital Stay (HOSPITAL_COMMUNITY): Payer: Managed Care, Other (non HMO) | Admitting: Hematology

## 2021-06-11 ENCOUNTER — Other Ambulatory Visit: Payer: Self-pay

## 2021-06-11 VITALS — BP 146/93 | HR 71 | Temp 96.8°F | Resp 18 | Wt 275.6 lb

## 2021-06-11 DIAGNOSIS — C9110 Chronic lymphocytic leukemia of B-cell type not having achieved remission: Secondary | ICD-10-CM | POA: Diagnosis present

## 2021-06-11 DIAGNOSIS — D509 Iron deficiency anemia, unspecified: Secondary | ICD-10-CM

## 2021-06-11 DIAGNOSIS — C8300 Small cell B-cell lymphoma, unspecified site: Secondary | ICD-10-CM | POA: Diagnosis not present

## 2021-06-11 DIAGNOSIS — D696 Thrombocytopenia, unspecified: Secondary | ICD-10-CM | POA: Insufficient documentation

## 2021-06-11 DIAGNOSIS — D649 Anemia, unspecified: Secondary | ICD-10-CM | POA: Diagnosis not present

## 2021-06-11 NOTE — Patient Instructions (Signed)
[place] at [facility] Discharge Instructions  You were seen and examined today by Dr. Delton Coombes. He reviewed your most recent labs and CT scan. Everything looks good in your labs but you have a few new lymph nodes lighting up. Dr. Delton Coombes is ordering a special lab to see what is causing this. Depending on results he may change up your medication. Please follow up as scheduled.    Thank you for choosing [place] at [facility] to provide your oncology and hematology care.  To afford each patient quality time with our provider, please arrive at least 15 minutes before your scheduled appointment time.   If you have a lab appointment with the [facility] please come in thru the Main Entrance and check in at the main information desk.  You need to re-schedule your appointment should you arrive 10 or more minutes late.  We strive to give you quality time with our providers, and arriving late affects you and other patients whose appointments are after yours.  Also, if you no show three or more times for appointments you may be dismissed from the clinic at the providers discretion.     Again, thank you for choosing [facility].  Our hope is that these requests will decrease the amount of time that you wait before being seen by our physicians.       _____________________________________________________________  Should you have questions after your visit to [facility], please contact our office at [phone] and follow the prompts.  Our office hours are 8:00 a.m. and 4:30 p.m. Monday - Friday.  Please note that voicemails left after 4:00 p.m. may not be returned until the following business day.  We are closed weekends and major holidays.  You do have access to a nurse 24-7, just call the main number to the clinic [phone] and do not press any options, hold on the line and a nurse will answer the phone.    For  prescription refill requests, have your pharmacy contact our office and allow 72 hours.    Due to Covid, you will need to wear a mask upon entering the hospital. If you do not have a mask, a mask will be given to you at the Main Entrance upon arrival. For doctor visits, patients may have 1 support person age 2 or older with them. For treatment visits, patients can not have anyone with them due to social distancing guidelines and our immunocompromised population.

## 2021-06-14 NOTE — Progress Notes (Signed)
Cardiology Office Note   Date:  06/15/2021   ID:  Adam Reyes, DOB 09/26/1955, MRN [id number]  PCP:  [name], MD (Inactive)  Cardiologist: Dr. Ellyn Hack  CC: Follow-up chest pain   History of Present Illness: Adam Reyes is a 65 y.o. male who presents for ongoing assessment and management of nonobstructive CAD with bifurcation LAD diagonal lesion found angiographically per cardiac catheterization on 03/05/2017.  He also has a history of chronic lymphocytic leukemia, hypertension, hyperlipidemia, and past history of COVID-19 in July 2022.  He was last seen by Dr. Ellyn Hack on 09/13/2020 with complaints of shortness of breath and chest discomfort.  Dr. Ellyn Hack at that time planned a nuclear medicine stress test as she was also having some increasing symptoms.  Unfortunately, stress test was not completed as the patient saw his primary care physician and was diagnosed with significant anemia with a hemoglobin of 7.7.  A phone call was made to our office to report this.  He is being followed by hematology/oncology for management.  He is being treated with Ibrutinib 420 mg daily; and 6 cycles of rituximab from 12/14/2017 through 05/21/2018.  Most recent CBC on 05/31/2021 documented a hemoglobin of 16.7 and hematocrit of 48.3.  He did have some thrombocytopenia with a platelet count of 139 K/uL  He comes today without any recurrent chest pain.  He is being followed closely by hematology.  He denies any shortness of breath, dizziness, or significant fatigue.  Past Medical History:  Diagnosis Date   Chronic lymphocytic leukemia (CLL), B-cell ([place])    Small Cell Lymphoma   Coronary artery disease, non-occlusive 02/2017   Cardiac cath in setting of MI: 53 and 55% bifurcation LAD-Diag1   Enlarged lymph node    left neck   STEMI (ST elevation myocardial infarction) ([place]) 03/05/2017   hx/notes 03/05/2017 -likely aborted anterior STEMI with 50% bifurcation LAD-Diag1.  No PCI.  Preserved EF    Past  Surgical History:  Procedure Laterality Date   BIOPSY  10/10/2020   Procedure: BIOPSY;  Surgeon: Harvel Quale, MD;  [facility];  Service: Gastroenterology;;   BIOPSY  11/14/2020   Procedure: BIOPSY;  Surgeon: Harvel Quale, MD;  [facility];  Service: Gastroenterology;;   COLONOSCOPY N/A 10/11/2015   Procedure: COLONOSCOPY;  Surgeon: Rogene Houston, MD;  [facility];  Service: Endoscopy;  Laterality: N/A;  10/11/2015   COLONOSCOPY WITH PROPOFOL N/A 10/10/2020   Procedure: COLONOSCOPY WITH PROPOFOL;  Surgeon: Harvel Quale, MD;  [facility];  Service: Gastroenterology;  Laterality: N/A;  AM   ENTEROSCOPY N/A 11/14/2020   Procedure: push enteroscopy;  Surgeon: Harvel Quale, MD;  [facility];  Service: Gastroenterology;  Laterality: N/A;   ESOPHAGOGASTRODUODENOSCOPY (EGD) WITH PROPOFOL N/A 10/10/2020   Procedure: ESOPHAGOGASTRODUODENOSCOPY (EGD) WITH PROPOFOL;  Surgeon: Harvel Quale, MD;  [facility];  Service: Gastroenterology;  Laterality: N/A;   ESOPHAGOGASTRODUODENOSCOPY (EGD) WITH PROPOFOL N/A 11/14/2020   Procedure: ESOPHAGOGASTRODUODENOSCOPY (EGD) WITH PROPOFOL;  Surgeon: Harvel Quale, MD;  [facility];  Service: Gastroenterology;  Laterality: N/A;  12:00   GIVENS CAPSULE STUDY N/A 11/07/2020   Procedure: GIVENS CAPSULE STUDY;  Surgeon: Harvel Quale, MD;  [facility];  Service: Gastroenterology;  Laterality: N/A;  7:30 am   Graded Exercise Tolerance Test (GXT/ETT)  05/2017   10.7 METs (9: 25 min.  Reached 103% max predicted heart rate).  No EKG  findings to suggest coronary ischemia.  Negative, low risk GXT   HEMOSTASIS CLIP PLACEMENT  11/14/2020   Procedure: HEMOSTASIS CLIP PLACEMENT;  Surgeon: Harvel Quale, MD;  [facility];  Service: Gastroenterology;;  small bowel nodule   LEFT HEART CATH AND  CORONARY ANGIOGRAPHY N/A 03/05/2017   Procedure: LEFT HEART CATH AND CORONARY ANGIOGRAPHY;  Surgeon: Leonie Man, MD;  Location: [facility];  Service: Cardiovascular: pLAD-Diag1 50-55% (non-flow-limiiting).  EF ~50-55%.  although bifurcation lesion was presumed Culprit - no PCI (not flow limiting). - Med Rx.   MASS BIOPSY Left 06/27/2015   Procedure: OPEN LEFT NECK BIOPSY ;  Surgeon: Leta Baptist, MD;  Location: [facility];  Service: ENT;  Laterality: Left;   POLYPECTOMY  10/10/2020   Procedure: POLYPECTOMY;  Surgeon: Montez Morita, Quillian Quince, MD;  [facility];  Service: Gastroenterology;;   REFRACTIVE SURGERY Bilateral      Current Outpatient Medications  Medication Sig Dispense Refill   aspirin 81 MG chewable tablet Chew 1 tablet (81 mg total) by mouth every other day. 30 tablet 11   cholecalciferol (VITAMIN D3) 25 MCG (1000 UNIT) tablet Take 10,000 Units by mouth daily.     ferrous sulfate 324 (65 Fe) MG TBEC Take 324 mg by mouth every other day.     ibrutinib (IMBRUVICA) 420 MG TABS TAKE 1 TABLET (420 MG) BY MOUTH DAILY. 28 tablet 3   nitroGLYCERIN (NITROSTAT) 0.4 MG SL tablet PLACE 1 TABLET UNDER THE TONGUE EVERY 5 MINUTES AS NEEDED FOR CHEST PAIN 25 tablet 2   atorvastatin (LIPITOR) 40 MG tablet TAKE 1 TABLET(40 MG) BY MOUTH DAILY 90 tablet 3   metoprolol tartrate (LOPRESSOR) 25 MG tablet Take 0.5 tablets (12.5 mg total) by mouth 2 (two) times daily. TAKE 1/2 TABLET(12.5 MG) BY MOUTH TWICE DAILY 90 tablet 3   No current facility-administered medications for this visit.    Allergies:   Patient has no known allergies.    Social History:  The patient  reports that he has quit smoking. His smoking use included cigarettes. He has quit using smokeless tobacco.  His smokeless tobacco use included chew and snuff. He reports current alcohol use of about 2.0 standard drinks per week. He reports that he does not use drugs.   Family History:  The patient's  family history includes Cancer in his brother and father; Diabetes in his mother.    ROS: All other systems are reviewed and negative. Unless otherwise mentioned in H&P    PHYSICAL EXAM: VS:  BP 138/80   Pulse 80   Ht 6\' 4"  (1.93 m)   Wt 269 lb (122 kg)   SpO2 98%   BMI 32.74 kg/m  , BMI Body mass index is 32.74 kg/m. GEN: Well nourished, well developed, in no acute distress HEENT: normal Neck: no JVD, carotid bruits, or masses Cardiac: RRR; no murmurs, rubs, or gallops,no edema  Respiratory:  Clear to auscultation bilaterally, normal work of breathing GI: soft, nontender, nondistended, + BS MS: no deformity or atrophy Skin: warm and dry, no rash Neuro:  Strength and sensation are intact Psych: euthymic mood, full affect   EKG:  EKG is ordered today. The ekg ordered today demonstrates (personally reviewed) normal sinus rhythm Motion artifact is noted, heart rate of 80 bpm.   Recent Labs: 08/31/2020: TSH 1.59 01/04/2021: Magnesium 2.4 05/31/2021: ALT 32; BUN 20; Creatinine, Ser 0.98; Hemoglobin 16.7; Platelets 139; Potassium 3.8; Sodium 140    Lipid Panel  Component Value Date/Time   CHOL 90 08/31/2020 1537   CHOL 111 11/18/2018 1527   TRIG 86 08/31/2020 1537   HDL 35 (L) 08/31/2020 1537   HDL 39 (L) 11/18/2018 1527   CHOLHDL 2.6 08/31/2020 1537   VLDL 11 06/29/2018 0854   LDLCALC 38 08/31/2020 1537      Wt Readings from Last 3 Encounters:  06/15/21 269 lb (122 kg)  06/11/21 275 lb 9.6 oz (125 kg)  01/24/21 264 lb (119.7 kg)      Other studies Reviewed: Not yet completed  ASSESSMENT AND PLAN:  1.  Noncardiac chest discomfort: Associated with severe anemia.  Followed by hematology with chemotherapy and significant improvement in status.  I will go ahead and order an echocardiogram for LV function as one was not being able to be completed due to profound anemia found on last office visit.  This will also allow for further treatment options should they be  necessary.  He is currently asymptomatic.  2.  Hyperlipidemia: Currently on statin therapy and is being followed by PCP in the next couple of weeks for annual labs.  No changes to medication regimen at this time.  3.  Chronic lymphocytic leukemia with chronic normocytic anemia: Followed by hematology now on p.o. iron after courses of chemotherapy for his leukemia.  Defer to hematology for ongoing management.   Current medicines are reviewed at length with the patient today.  I have spent 25 min's  dedicated to the care of this patient on the date of this encounter to include pre-visit review of records, assessment, management and diagnostic testing,with shared decision making.  Labs/ tests ordered today include: Echocardiogram  Phill Myron. West Pugh, ANP, AACC   06/15/2021 5:40 PM    [facility] [place] [address] Office [phone] Fax [phone]  Notice: This dictation was prepared with Dragon dictation along with smaller phrase technology. Any transcriptional errors that result from this process are unintentional and may not be corrected upon review.

## 2021-06-15 ENCOUNTER — Other Ambulatory Visit (HOSPITAL_COMMUNITY): Payer: Self-pay | Admitting: *Deleted

## 2021-06-15 ENCOUNTER — Other Ambulatory Visit: Payer: Self-pay

## 2021-06-15 ENCOUNTER — Ambulatory Visit: Payer: Managed Care, Other (non HMO) | Admitting: Adult Health

## 2021-06-15 ENCOUNTER — Encounter: Payer: Self-pay | Admitting: Adult Health

## 2021-06-15 VITALS — BP 138/80 | HR 80 | Ht 76.0 in | Wt 269.0 lb

## 2021-06-15 DIAGNOSIS — E7800 Pure hypercholesterolemia, unspecified: Secondary | ICD-10-CM

## 2021-06-15 DIAGNOSIS — D649 Anemia, unspecified: Secondary | ICD-10-CM

## 2021-06-15 DIAGNOSIS — I428 Other cardiomyopathies: Secondary | ICD-10-CM | POA: Diagnosis not present

## 2021-06-15 DIAGNOSIS — R0789 Other chest pain: Secondary | ICD-10-CM

## 2021-06-15 DIAGNOSIS — C9111 Chronic lymphocytic leukemia of B-cell type in remission: Secondary | ICD-10-CM

## 2021-06-15 DIAGNOSIS — C8300 Small cell B-cell lymphoma, unspecified site: Secondary | ICD-10-CM

## 2021-06-15 MED ORDER — METOPROLOL TARTRATE 25 MG PO TABS: 12.5000 mg | ORAL_TABLET | Freq: Two times a day (BID) | ORAL | 3 refills | Status: DC

## 2021-06-15 MED ORDER — ATORVASTATIN CALCIUM 40 MG PO TABS: ORAL_TABLET | ORAL | 3 refills | Status: DC

## 2021-06-15 NOTE — Patient Instructions (Signed)
Medication Instructions:  No changes *If you need a refill on your cardiac medications before your next appointment, please call your pharmacy*   Lab Work: No labs If you have labs (blood work) drawn today and your tests are completely normal, you will receive your results only by: [place] (if you have MyChart) OR A paper copy in the mail If you have any lab test that is abnormal or we need to change your treatment, we will call you to review the results.   Testing/Procedures: [address]. Your physician has requested that you have an echocardiogram. Echocardiography is a painless test that uses sound waves to create images of your heart. It provides your doctor with information about the size and shape of your heart and how well your heart's chambers and valves are working. This procedure takes approximately one hour. There are no restrictions for this procedure.    Follow-Up: At [facility], you and your health needs are our priority.  As part of our continuing mission to provide you with exceptional heart care, we have created designated Provider Care Teams.  These Care Teams include your primary Cardiologist (physician) and Advanced Practice Providers (APPs -  Physician Assistants and Nurse Practitioners) who all work together to provide you with the care you need, when you need it.  We recommend signing up for the patient portal called "MyChart".  Sign up information is provided on this After Visit Summary.  MyChart is used to connect with patients for Virtual Visits (Telemedicine).  Patients are able to view lab/test results, encounter notes, upcoming appointments, etc.  Non-urgent messages can be sent to your provider as well.   To learn more about what you can do with MyChart, go to [email or web address].    Your next appointment:   1 year(s)  The format for your next appointment:   In Person  Provider:   Glenetta Hew, MD

## 2021-06-18 ENCOUNTER — Inpatient Hospital Stay (HOSPITAL_COMMUNITY): Payer: Managed Care, Other (non HMO) | Attending: Hematology

## 2021-06-18 ENCOUNTER — Other Ambulatory Visit: Payer: Self-pay

## 2021-06-18 DIAGNOSIS — D696 Thrombocytopenia, unspecified: Secondary | ICD-10-CM | POA: Diagnosis not present

## 2021-06-18 DIAGNOSIS — D649 Anemia, unspecified: Secondary | ICD-10-CM | POA: Insufficient documentation

## 2021-06-18 DIAGNOSIS — C8301 Small cell B-cell lymphoma, lymph nodes of head, face, and neck: Secondary | ICD-10-CM | POA: Insufficient documentation

## 2021-06-18 DIAGNOSIS — C8300 Small cell B-cell lymphoma, unspecified site: Secondary | ICD-10-CM

## 2021-06-22 LAB — FISH HES LEUKEMIA, 4Q12 REA

## 2021-06-28 ENCOUNTER — Other Ambulatory Visit (HOSPITAL_COMMUNITY): Payer: Self-pay

## 2021-07-01 NOTE — Progress Notes (Signed)
Adam Reyes, Adam Reyes [id number]   CLINIC:  Medical Oncology/Hematology  PCP:  [name], MD (Inactive) None None   REASON FOR VISIT:  Follow-up for small lymphocytic lymphoma and symptomatic anemia  PRIOR THERAPY: Rituximab monthly from 01/01/2018 to 05/21/2018  NGS Results: not done  CURRENT THERAPY: Ibrutinib 420 mg daily; intermittent Feraheme last on 09/29/2020  BRIEF ONCOLOGIC HISTORY:  Oncology History  Chronic lymphocytic leukemia (CLL), B-cell ([place])  06/14/2015 Imaging   CT neck- Bulky adenopathy throughout the neck bilaterally. There also are enlarged parotid lymph nodes bilaterally. There is bilateral axillary adenopathy as well as mediastinal adenopathy. Findings are consistent with lymphoma. Biopsy recommended.   06/27/2015 Procedure   Left neck lymph node biopsy by Dr. Benjamine Mola   06/27/2015 Pathology Results   Diagnosis Lymph node for lymphoma, Left neck node for lymphoma work up - [place].  LOW GRADE.   06/27/2015 Pathology Results   Tissue-Flow Cytometry - MONOCLONAL B CELL POPULATION IDENTIFIED. The phenotypic features are consistent with small lymphocytic lymphoma/chronic lymphocytic leukemia and correlate well with the morphology in the lymph node   Lymphoma, small lymphocytic ([place])  10/14/2017 Initial Diagnosis   Lymphoma, small lymphocytic ([place])   01/01/2018 - 05/21/2018 Chemotherapy   The patient had riTUXimab (RITUXAN) 900 mg in sodium chloride 0.9 % 250 mL (2.6471 mg/mL) infusion, 375 mg/m2 = 900 mg, Intravenous,  Once, 6 of 6 cycles Dose modification: 500 mg/m2 (original dose 500 mg/m2, Cycle 2, Reason: Provider Judgment) Administration: 900 mg (01/01/2018), 1,300 mg (01/29/2018), 1,300 mg (02/26/2018), 1,300 mg (03/26/2018), 1,300 mg (04/23/2018), 1,300 mg (05/21/2018)   for chemotherapy treatment.       CANCER STAGING:  Cancer Staging  No matching staging information was found for the  patient.  INTERVAL HISTORY:  Adam Reyes, a 65 y.o. male, returns for routine follow-up of his small lymphocytic lymphoma and symptomatic anemia. [name] was last seen on 06/11/2021.  Today he reports feeling good.  He is taking ibrutinib and tolerating it well. The nodule on his left heel has resolved.   REVIEW OF SYSTEMS:  Review of Systems  Constitutional:  Negative for appetite change and fatigue (80%).  All other systems reviewed and are negative.  PAST MEDICAL/SURGICAL HISTORY:  Past Medical History:  Diagnosis Date   Chronic lymphocytic leukemia (CLL), B-cell ([place])    Small Cell Lymphoma   Coronary artery disease, non-occlusive 02/2017   Cardiac cath in setting of MI: 52 and 55% bifurcation LAD-Diag1   Enlarged lymph node    left neck   STEMI (ST elevation myocardial infarction) ([place]) 03/05/2017   hx/notes 03/05/2017 -likely aborted anterior STEMI with 50% bifurcation LAD-Diag1.  No PCI.  Preserved EF   Past Surgical History:  Procedure Laterality Date   BIOPSY  10/10/2020   Procedure: BIOPSY;  Surgeon: Harvel Quale, MD;  [facility];  Service: Gastroenterology;;   BIOPSY  11/14/2020   Procedure: BIOPSY;  Surgeon: Harvel Quale, MD;  [facility];  Service: Gastroenterology;;   COLONOSCOPY N/A 10/11/2015   Procedure: COLONOSCOPY;  Surgeon: Rogene Houston, MD;  [facility];  Service: Endoscopy;  Laterality: N/A;  10/11/2015   COLONOSCOPY WITH PROPOFOL N/A 10/10/2020   Procedure: COLONOSCOPY WITH PROPOFOL;  Surgeon: Harvel Quale, MD;  [facility];  Service: Gastroenterology;  Laterality: N/A;  AM   ENTEROSCOPY N/A 11/14/2020   Procedure: push enteroscopy;  Surgeon: Harvel Quale,  MD;  [facility];  Service: Gastroenterology;  Laterality: N/A;   ESOPHAGOGASTRODUODENOSCOPY (EGD) WITH PROPOFOL N/A 10/10/2020   Procedure: ESOPHAGOGASTRODUODENOSCOPY (EGD) WITH PROPOFOL;   Surgeon: Harvel Quale, MD;  [facility];  Service: Gastroenterology;  Laterality: N/A;   ESOPHAGOGASTRODUODENOSCOPY (EGD) WITH PROPOFOL N/A 11/14/2020   Procedure: ESOPHAGOGASTRODUODENOSCOPY (EGD) WITH PROPOFOL;  Surgeon: Harvel Quale, MD;  [facility];  Service: Gastroenterology;  Laterality: N/A;  12:00   GIVENS CAPSULE STUDY N/A 11/07/2020   Procedure: GIVENS CAPSULE STUDY;  Surgeon: Harvel Quale, MD;  [facility];  Service: Gastroenterology;  Laterality: N/A;  7:30 am   Graded Exercise Tolerance Test (GXT/ETT)  05/2017   10.7 METs (9: 25 min.  Reached 103% max predicted heart rate).  No EKG findings to suggest coronary ischemia.  Negative, low risk GXT   HEMOSTASIS CLIP PLACEMENT  11/14/2020   Procedure: HEMOSTASIS CLIP PLACEMENT;  Surgeon: Harvel Quale, MD;  [facility];  Service: Gastroenterology;;  small bowel nodule   LEFT HEART CATH AND CORONARY ANGIOGRAPHY N/A 03/05/2017   Procedure: LEFT HEART CATH AND CORONARY ANGIOGRAPHY;  Surgeon: Leonie Man, MD;  Location: [facility];  Service: Cardiovascular: pLAD-Diag1 50-55% (non-flow-limiiting).  EF ~50-55%.  although bifurcation lesion was presumed Culprit - no PCI (not flow limiting). - Med Rx.   MASS BIOPSY Left 06/27/2015   Procedure: OPEN LEFT NECK BIOPSY ;  Surgeon: Leta Baptist, MD;  Location: [facility];  Service: ENT;  Laterality: Left;   POLYPECTOMY  10/10/2020   Procedure: POLYPECTOMY;  Surgeon: Harvel Quale, MD;  [facility];  Service: Gastroenterology;;   REFRACTIVE SURGERY Bilateral     SOCIAL HISTORY:  Social History   Socioeconomic History   Marital status: Married    Spouse name: Not on file   Number of children: Not on file   Years of education: Not on file   Highest education level: Not on file  Occupational History   Not on file  Tobacco Use   Smoking status: Former     Years: 2.00    Types: Cigarettes   Smokeless tobacco: Former    Types: Chew, Snuff   Tobacco comments:    03/06/2017 "quit smoking when I was young; quit chew/snuff in ~ 2008"  Vaping Use   Vaping Use: Never used  Substance and Sexual Activity   Alcohol use: Yes    Alcohol/week: 2.0 standard drinks    Types: 2 Cans of beer per week   Drug use: No   Sexual activity: Yes  Other Topics Concern   Not on file  Social History Narrative   Married since 2001,third.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   Social Determinants of Health   Financial Resource Strain: Not on file  Food Insecurity: Not on file  Transportation Needs: Not on file  Physical Activity: Not on file  Stress: Not on file  Social Connections: Not on file  Intimate Partner Violence: Not on file    FAMILY HISTORY:  Family History  Problem Relation Age of Onset   Diabetes Mother    Cancer Father    Cancer Brother     CURRENT MEDICATIONS:  Current Outpatient Medications  Medication Sig Dispense Refill   aspirin 81 MG chewable tablet Chew 1 tablet (81 mg total) by mouth every other day. 30 tablet 11   atorvastatin (LIPITOR) 40 MG tablet TAKE 1 TABLET(40 MG) BY MOUTH DAILY 90 tablet 3  cholecalciferol (VITAMIN D3) 25 MCG (1000 UNIT) tablet Take 10,000 Units by mouth daily.     ferrous sulfate 324 (65 Fe) MG TBEC Take 324 mg by mouth every other day.     ibrutinib (IMBRUVICA) 420 MG TABS TAKE 1 TABLET (420 MG) BY MOUTH DAILY. 28 tablet 3   metoprolol tartrate (LOPRESSOR) 25 MG tablet Take 0.5 tablets (12.5 mg total) by mouth 2 (two) times daily. TAKE 1/2 TABLET(12.5 MG) BY MOUTH TWICE DAILY 90 tablet 3   nitroGLYCERIN (NITROSTAT) 0.4 MG SL tablet PLACE 1 TABLET UNDER THE TONGUE EVERY 5 MINUTES AS NEEDED FOR CHEST PAIN (Patient not taking: Reported on 07/02/2021) 25 tablet 2   No current facility-administered medications for this visit.    ALLERGIES:  No Known Allergies  PHYSICAL EXAM:  Performance status  (ECOG): 0 - Asymptomatic  Vitals:   07/02/21 1022  BP: (!) 147/84  Pulse: 64  Resp: 16  Temp: 97.6 F (36.4 C)  SpO2: 94%   Wt Readings from Last 3 Encounters:  07/02/21 267 lb 4.8 oz (121.2 kg)  06/15/21 269 lb (122 kg)  06/11/21 275 lb 9.6 oz (125 kg)   Physical Exam Vitals reviewed.  Constitutional:      Appearance: Normal appearance. He is obese.  Cardiovascular:     Rate and Rhythm: Normal rate and regular rhythm.     Pulses: Normal pulses.     Heart sounds: Normal heart sounds.  Pulmonary:     Effort: Pulmonary effort is normal.     Breath sounds: Normal breath sounds.  Neurological:     General: No focal deficit present.     Mental Status: He is alert and oriented to person, place, and time.  Psychiatric:        Mood and Affect: Mood normal.        Behavior: Behavior normal.     LABORATORY DATA:  I have reviewed the labs as listed.  CBC Latest Ref Rng & Units 05/31/2021 02/19/2021 12/07/2020  WBC 4.0 - 10.5 K/uL 7.7 7.4 6.7  Hemoglobin 13.0 - 17.0 g/dL 16.7 16.4 14.3  Hematocrit 39.0 - 52.0 % 48.3 47.6 44.4  Platelets 150 - 400 K/uL 139(L) 146(L) 155   CMP Latest Ref Rng & Units 05/31/2021 02/19/2021 01/04/2021  Glucose 70 - 99 mg/dL 80 79 79  BUN 8 - 23 mg/dL _0 Creatinine 0.61 - 1.24 mg/dL 0.98 1.02 1.15  Sodium 135 - 145 mmol/L 140 136 143  Potassium 3.5 - 5.1 mmol/L 3.8 3.6 3.6  Chloride 98 - 111 mmol/L 107 106 107  CO2 22 - 32 mmol/L _1 Calcium 8.9 - 10.3 mg/dL 9.5 9.0 9.5  Total Protein 6.5 - 8.1 g/dL 6.6 6.8 6.4  Total Bilirubin 0.3 - 1.2 mg/dL 1.3(H) 1.7(H) 0.6  Alkaline Phos 38 - 126 U/L 56 58 -  AST 15 - 41 U/L _2 ALT 0 - 44 U/L 32 31 18    DIAGNOSTIC IMAGING:  I have independently reviewed the scans and discussed with the patient. No results found.   ASSESSMENT:  1.  Stage IV small lymphocytic lymphoma: -CLL FISH panel normal, Ig HV positive, T p53 negative. -Ibrutinib 420 mg started on 12/01/2017, 6 cycles of rituximab  from 12/14/2017 through 05/21/2018. -CT CAP on 06/30/2019 showed interval decrease in size of multiple bilateral iliac and pelvic sidewall and inguinal lymph nodes.  Largest left inguinal lymph node measures 1.7 x 1.3 cm.  Additional unchanged enlarged  lower paraesophageal and retroperitoneal lymph nodes present.  Multiple stable likely benign small pulmonary nodules.   2.  Health maintenance: - Colonoscopy on 10/10/2020 showed multiple polyps with no bleeding areas. - EGD on 10/10/2020 showed normal esophagus, few gastric polyps, gastric erosions with no stigmata of recent bleeding. - Push enteroscopy on 11/14/2020 did not reveal any bleeding issues.   PLAN:  1.  Stage IV small lymphocytic lymphoma: - CT CAP on 06/01/2021 showed newly enlarged pretracheal, right hilar and subcarinal lymph nodes and left iliac and pelvic sidewall lymph nodes.  Additional thoracic, periaortic and retroperitoneal lymph nodes are unchanged.  Mild unchanged splenomegaly. - Reviewed CLL FISH panel which was negative. - We had a prolonged discussion about switching therapy to venetoclax obinutuzumab versus continuing ibrutinib and repeating scan in 3 to 4 months.  If there is continued progression, will consider switching to venetoclax and obinutuzumab regimen. - He and his wife are okay to wait and watch at this time.  We will plan to repeat labs at next visit.   2.  Mild thrombocytopenia: - On and off mild thrombocytopenia from myelosuppression is stable.  Platelet count is 139.   3.  Mild hyperbilirubinemia: - Last labs show elevated total bilirubin of 1.3.  Most likely Ibrutinib.  We will closely monitor.   4.  Elevated creatinine: - Creatinine improved to 0.98.   5.  High risk drug monitoring: - Heart rate is regular with no signs of A. fib.   6.  Normocytic anemia: - Last Venofer on 09/19/2020. - Continue iron tablet every other day. - Labs from 05/31/2021 with hemoglobin 16.7 and ferritin 28.     Orders  placed this encounter:  No orders of the defined types were placed in this encounter.    Derek Jack, MD [place] [phone]   I, Thana Ates, am acting as a scribe for Dr. Derek Jack.  I, Derek Jack MD, have reviewed the above documentation for accuracy and completeness, and I agree with the above.

## 2021-07-02 ENCOUNTER — Other Ambulatory Visit: Payer: Self-pay

## 2021-07-02 ENCOUNTER — Inpatient Hospital Stay (HOSPITAL_COMMUNITY): Payer: Managed Care, Other (non HMO) | Admitting: Hematology

## 2021-07-02 VITALS — BP 147/84 | HR 64 | Temp 97.6°F | Resp 16 | Ht 76.0 in | Wt 267.3 lb

## 2021-07-02 DIAGNOSIS — D509 Iron deficiency anemia, unspecified: Secondary | ICD-10-CM | POA: Diagnosis not present

## 2021-07-02 DIAGNOSIS — D649 Anemia, unspecified: Secondary | ICD-10-CM | POA: Diagnosis not present

## 2021-07-02 DIAGNOSIS — C8300 Small cell B-cell lymphoma, unspecified site: Secondary | ICD-10-CM

## 2021-07-02 DIAGNOSIS — C8301 Small cell B-cell lymphoma, lymph nodes of head, face, and neck: Secondary | ICD-10-CM | POA: Diagnosis not present

## 2021-07-02 DIAGNOSIS — D696 Thrombocytopenia, unspecified: Secondary | ICD-10-CM | POA: Diagnosis not present

## 2021-07-02 NOTE — Patient Instructions (Signed)
Adam Reyes at [facility] Discharge Instructions   You were seen and examined today by Dr. Delton Coombes.  He reviewed the results of your scan which showed some new lymph nodes.   For now, we will carefully watch these areas to see if they get worse.  Continue Imbruvica as prescribed.   Return as scheduled for lab work, CT scan, and office visit.     Thank you for choosing [facility] to provide your oncology and hematology care.  To afford each patient quality time with our provider, please arrive at least 15 minutes before your scheduled appointment time.   If you have a lab appointment with the [facility] please come in thru the Main Entrance and check in at the main information desk.  You need to re-schedule your appointment should you arrive 10 or more minutes late.  We strive to give you quality time with our providers, and arriving late affects you and other patients whose appointments are after yours.  Also, if you no show three or more times for appointments you may be dismissed from the clinic at the providers discretion.     Again, thank you for choosing [facility].  Our hope is that these requests will decrease the amount of time that you wait before being seen by our physicians.       _____________________________________________________________  Should you have questions after your visit to [facility], please contact our office at [phone] and follow the prompts.  Our office hours are 8:00 a.m. and 4:30 p.m. Monday - Friday.  Please note that voicemails left after 4:00 p.m. may not be returned until the following business day.  We are closed weekends and major holidays.  You do have access to a nurse 24-7, just call the main number to the clinic [phone] and do not press any options, hold on the line and a nurse will answer the phone.    For prescription refill requests, have  your pharmacy contact our office and allow 72 hours.    Due to Covid, you will need to wear a mask upon entering the hospital. If you do not have a mask, a mask will be given to you at the Main Entrance upon arrival. For doctor visits, patients may have 1 support person age 64 or older with them. For treatment visits, patients can not have anyone with them due to social distancing guidelines and our immunocompromised population.

## 2021-07-02 NOTE — Progress Notes (Signed)
Patient is taking Imbruvica as prescribed.  He has not missed any doses and reports no side effects at this time.

## 2021-07-03 ENCOUNTER — Other Ambulatory Visit (HOSPITAL_COMMUNITY): Payer: Self-pay

## 2021-07-24 ENCOUNTER — Ambulatory Visit (HOSPITAL_COMMUNITY): Payer: Managed Care, Other (non HMO) | Attending: Cardiology

## 2021-07-24 ENCOUNTER — Other Ambulatory Visit: Payer: Self-pay

## 2021-07-24 DIAGNOSIS — I428 Other cardiomyopathies: Secondary | ICD-10-CM | POA: Diagnosis not present

## 2021-07-24 DIAGNOSIS — I5030 Unspecified diastolic (congestive) heart failure: Secondary | ICD-10-CM

## 2021-07-24 DIAGNOSIS — I358 Other nonrheumatic aortic valve disorders: Secondary | ICD-10-CM

## 2021-07-24 DIAGNOSIS — I77810 Thoracic aortic ectasia: Secondary | ICD-10-CM

## 2021-07-24 DIAGNOSIS — I77819 Aortic ectasia, unspecified site: Secondary | ICD-10-CM

## 2021-07-24 DIAGNOSIS — I517 Cardiomegaly: Secondary | ICD-10-CM | POA: Diagnosis not present

## 2021-07-24 LAB — ECHOCARDIOGRAM COMPLETE
Area-P 1/2: 1.84 cm2
P 1/2 time: 587 msec
S' Lateral: 3 cm

## 2021-07-25 ENCOUNTER — Telehealth: Payer: Self-pay

## 2021-07-25 NOTE — Telephone Encounter (Addendum)
Called patient regarding results. Left message----- Message from Lendon Colonel, NP sent at 07/25/2021  4:22 PM EST ----- I have reviewed his echocardiogram report. He has normal pumping function. Some aortic valve back flow which is mild to moderate. No changes in his medications for now.   KL

## 2021-07-26 ENCOUNTER — Other Ambulatory Visit (HOSPITAL_COMMUNITY): Payer: Self-pay

## 2021-07-31 ENCOUNTER — Other Ambulatory Visit (HOSPITAL_COMMUNITY): Payer: Self-pay

## 2021-08-01 ENCOUNTER — Telehealth: Payer: Self-pay

## 2021-08-01 NOTE — Telephone Encounter (Addendum)
Called patient regarding results. Patient had understanding of results.----- Message from Lendon Colonel, NP sent at 07/25/2021  4:22 PM EST ----- I have reviewed his echocardiogram report. He has normal pumping function. Some aortic valve back flow which is mild to moderate. No changes in his medications for now.   KL

## 2021-08-06 ENCOUNTER — Other Ambulatory Visit: Payer: Self-pay

## 2021-08-06 ENCOUNTER — Ambulatory Visit: Payer: Managed Care, Other (non HMO) | Admitting: Internal Medicine

## 2021-08-06 ENCOUNTER — Encounter: Payer: Self-pay | Admitting: Internal Medicine

## 2021-08-06 VITALS — BP 164/88 | HR 71 | Ht 76.0 in | Wt 276.0 lb

## 2021-08-06 DIAGNOSIS — I10 Essential (primary) hypertension: Secondary | ICD-10-CM | POA: Diagnosis not present

## 2021-08-06 DIAGNOSIS — C8300 Small cell B-cell lymphoma, unspecified site: Secondary | ICD-10-CM | POA: Diagnosis not present

## 2021-08-06 DIAGNOSIS — Z2821 Immunization not carried out because of patient refusal: Secondary | ICD-10-CM

## 2021-08-06 DIAGNOSIS — D509 Iron deficiency anemia, unspecified: Secondary | ICD-10-CM | POA: Diagnosis not present

## 2021-08-06 DIAGNOSIS — R7303 Prediabetes: Secondary | ICD-10-CM

## 2021-08-06 DIAGNOSIS — I25119 Atherosclerotic heart disease of native coronary artery with unspecified angina pectoris: Secondary | ICD-10-CM

## 2021-08-06 DIAGNOSIS — I700 Atherosclerosis of aorta: Secondary | ICD-10-CM

## 2021-08-06 DIAGNOSIS — E785 Hyperlipidemia, unspecified: Secondary | ICD-10-CM

## 2021-08-06 NOTE — Assessment & Plan Note (Addendum)
BP Readings from Last 1 Encounters:  08/06/21 (!) 164/88   Elevated today, but usually well-controlled at home around 120s/70s - could be due to white coat HTN If persistently elevated, will add ARB Counseled for compliance with the medications Advised DASH diet and moderate exercise/walking, at least 150 mins/week

## 2021-08-06 NOTE — Patient Instructions (Addendum)
Please continue to take medications as prescribed.  Please follow low salt diet and perform moderate exercise/walking at least 150 mins/week.  Please discuss about getting Shingrix vaccine with your Oncologist.

## 2021-08-06 NOTE — Assessment & Plan Note (Signed)
Noted on imaging On Aspirin and statin

## 2021-08-06 NOTE — Assessment & Plan Note (Signed)
On Imbruvica Followed by Oncology

## 2021-08-06 NOTE — Assessment & Plan Note (Signed)
Has had cardiac cath, recent nuclear stress test - benign On Aspirin, statin and Metoprolol Followed by Cardiology 

## 2021-08-06 NOTE — Assessment & Plan Note (Signed)
Due to GI bleeding in the past, had venofer transfusions On iron supplements now

## 2021-08-06 NOTE — Progress Notes (Signed)
New Patient Office Visit  Subjective:  Patient ID: Adam Reyes, male    DOB: 1956-05-22  Age: 66 y.o. MRN: [id number]  CC:  Chief Complaint  Patient presents with   New Patient (Initial Visit)    Establish care     HPI Adam Reyes is a 66 y.o. male with past medical history of CAD, HTN, HLD and lymphocytic lymphoma who presents for establishing care.  HTN: His BP was elevated in the office today upon multiple measurements.  He states that his BP remains high during office visits, but remains well controlled at home.  He denies any headache, dizziness, chest pain, dyspnea or palpitations.  He has h/o CAD and follows up with cardiology.  He is on aspirin, statin and beta-blocker currently.  He keeps nitroglycerin as needed for chest pain.  He recently had nuclear stress test, which was benign.  He sees oncology for h/o lymphocytic lymphoma and takes Imbruvica for it.  His last CT chest, abdomen/pelvis showed - Newly enlarged pretracheal, right hilar, and subcarinal lymph nodes, as well as left iliac and pelvic sidewall lymph nodes, concerning for recurrent lymphoma.  He denies any recent weight loss, fatigue or night sweats. He is planned to get repeat imaging.  He refused flu vaccine in the office today.  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  No PCI.  Preserved EF   Syncope due to orthostatic hypotension 04/21/2018    Past Surgical History:  Procedure Laterality Date   BIOPSY  10/10/2020   Procedure: BIOPSY;  Surgeon: Harvel Quale, MD;  [facility];  Service: Gastroenterology;;   BIOPSY   11/14/2020   Procedure: BIOPSY;  Surgeon: Harvel Quale, MD;  [facility];  Service: Gastroenterology;;   COLONOSCOPY N/A 10/11/2015   Procedure: COLONOSCOPY;  Surgeon: Rogene Houston, MD;  [facility];  Service: Endoscopy;  Laterality: N/A;  10/11/2015   COLONOSCOPY WITH PROPOFOL N/A 10/10/2020   Procedure: COLONOSCOPY WITH PROPOFOL;  Surgeon: Harvel Quale, MD;  [facility];  Service: Gastroenterology;  Laterality: N/A;  AM   ENTEROSCOPY N/A 11/14/2020   Procedure: push enteroscopy;  Surgeon: Harvel Quale, MD;  [facility];  Service: Gastroenterology;  Laterality: N/A;   ESOPHAGOGASTRODUODENOSCOPY (EGD) WITH PROPOFOL N/A 10/10/2020   Procedure: ESOPHAGOGASTRODUODENOSCOPY (EGD) WITH PROPOFOL;  Surgeon: Harvel Quale, MD;  [facility];  Service: Gastroenterology;  Laterality: N/A;   ESOPHAGOGASTRODUODENOSCOPY (EGD) WITH PROPOFOL N/A 11/14/2020   Procedure: ESOPHAGOGASTRODUODENOSCOPY (EGD) WITH PROPOFOL;  Surgeon: Harvel Quale, MD;  [facility];  Service: Gastroenterology;  Laterality: N/A;  12:00   GIVENS CAPSULE STUDY N/A 11/07/2020   Procedure: GIVENS CAPSULE STUDY;  Surgeon: Harvel Quale, MD;  [facility];  Service: Gastroenterology;  Laterality: N/A;  7:30 am   Graded Exercise Tolerance Test (GXT/ETT)  05/2017   10.7 METs (9: 25 min.  Reached 103% max predicted heart rate).  No EKG findings to suggest coronary ischemia.  Negative, low risk GXT   HEMOSTASIS CLIP PLACEMENT  11/14/2020   Procedure: HEMOSTASIS  CLIP PLACEMENT;  Surgeon: Montez Morita, Quillian Quince, MD;  [facility];  Service: Gastroenterology;;  small bowel nodule   LEFT HEART CATH AND CORONARY ANGIOGRAPHY N/A 03/05/2017   Procedure: LEFT HEART CATH AND CORONARY ANGIOGRAPHY;  Surgeon: Leonie Man, MD;  Location: [facility];  Service: Cardiovascular: pLAD-Diag1  50-55% (non-flow-limiiting).  EF ~50-55%.  although bifurcation lesion was presumed Culprit - no PCI (not flow limiting). - Med Rx.   MASS BIOPSY Left 06/27/2015   Procedure: OPEN LEFT NECK BIOPSY ;  Surgeon: Leta Baptist, MD;  Location: [place];  Service: ENT;  Laterality: Left;   POLYPECTOMY  10/10/2020   Procedure: POLYPECTOMY;  Surgeon: Harvel Quale, MD;  [facility];  Service: Gastroenterology;;   REFRACTIVE SURGERY Bilateral     Family History  Problem Relation Age of Onset   Diabetes Mother    Cancer Father    Cancer Brother     Social History   Socioeconomic History   Marital status: Married    Spouse name: Not on file   Number of children: Not on file   Years of education: Not on file   Highest education level: Not on file  Occupational History   Not on file  Tobacco Use   Smoking status: Former    Years: 2.00    Types: Cigarettes   Smokeless tobacco: Former    Types: Chew, Snuff   Tobacco comments:    03/06/2017 "quit smoking when I was young; quit chew/snuff in ~ 2008"  Vaping Use   Vaping Use: Never used  Substance and Sexual Activity   Alcohol use: Yes    Alcohol/week: 2.0 standard drinks    Types: 2 Cans of beer per week   Drug use: No   Sexual activity: Yes  Other Topics Concern   Not on file  Social History Narrative   Married since 2001,third.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   Social Determinants of Health   Financial Resource Strain: Not on file  Food Insecurity: Not on file  Transportation Needs: Not on file  Physical Activity: Not on file  Stress: Not on file  Social Connections: Not on file  Intimate Partner Violence: Not on file    ROS Review of Systems  Constitutional:  Negative for chills and fever.  HENT:  Negative for congestion and sore throat.   Eyes:  Negative for pain and discharge.  Respiratory:  Negative for cough and shortness of breath.   Cardiovascular:  Negative for chest pain  and palpitations.  Gastrointestinal:  Negative for constipation, diarrhea, nausea and vomiting.  Endocrine: Negative for polydipsia and polyuria.  Genitourinary:  Negative for dysuria and hematuria.  Musculoskeletal:  Negative for neck pain and neck stiffness.  Skin:  Negative for rash.  Neurological:  Negative for dizziness, weakness, numbness and headaches.  Psychiatric/Behavioral:  Negative for agitation and behavioral problems.    Objective:   Today's Vitals: BP (!) 164/88 (BP Location: Left Arm)    Pulse 71    Ht 6\' 4"  (1.93 m)    Wt 276 lb (125.2 kg)    SpO2 94%    BMI 33.60 kg/m   Physical Exam Vitals reviewed.  Constitutional:      General: He is not in acute distress.    Appearance: He is not diaphoretic.  HENT:     Head: Normocephalic and atraumatic.     Nose: Nose normal.     Mouth/Throat:     Mouth: Mucous  membranes are moist.  Eyes:     General: No scleral icterus.    Extraocular Movements: Extraocular movements intact.  Cardiovascular:     Rate and Rhythm: Normal rate and regular rhythm.     Pulses: Normal pulses.     Heart sounds: Normal heart sounds. No murmur heard. Pulmonary:     Breath sounds: Normal breath sounds. No wheezing or rales.  Musculoskeletal:     Cervical back: Neck supple. No tenderness.     Right lower leg: No edema.     Left lower leg: No edema.  Skin:    General: Skin is warm.     Findings: No rash.  Neurological:     General: No focal deficit present.     Mental Status: He is alert and oriented to person, place, and time.  Psychiatric:        Mood and Affect: Mood normal.        Behavior: Behavior normal.    Assessment & Plan:   Problem List Items Addressed This Visit       Cardiovascular and Mediastinum   Coronary artery disease involving native coronary artery of native heart with angina pectoris ([place]) - Primary (Chronic)    Has had cardiac cath, recent nuclear stress test - benign On Aspirin, statin and  Metoprolol Followed by Cardiology      Essential hypertension    BP Readings from Last 1 Encounters:  08/06/21 (!) 164/88  Elevated today, but usually well-controlled at home around 120s/70s - could be due to white coat HTN If persistently elevated, will add ARB Counseled for compliance with the medications Advised DASH diet and moderate exercise/walking, at least 150 mins/week       Aortic atherosclerosis (HCC)    Noted on imaging On Aspirin and statin        Other   Dyslipidemia, goal LDL below 70 (Chronic)    On statin Check lipid profile      Relevant Orders   Lipid Profile   Lymphoma, small lymphocytic ([place])    On Imbruvica Followed by Oncology      Iron deficiency anemia    Due to GI bleeding in the past, had venofer transfusions On iron supplements now      Other Visit Diagnoses     Refused influenza vaccine       Prediabetes       Relevant Orders   Hemoglobin A1c       Outpatient Encounter Medications as of 08/06/2021  Medication Sig   aspirin 81 MG chewable tablet Chew 1 tablet (81 mg total) by mouth every other day.   atorvastatin (LIPITOR) 40 MG tablet TAKE 1 TABLET(40 MG) BY MOUTH DAILY   cholecalciferol (VITAMIN D3) 25 MCG (1000 UNIT) tablet Take 10,000 Units by mouth daily.   ferrous sulfate 324 (65 Fe) MG TBEC Take 324 mg by mouth every other day.   ibrutinib (IMBRUVICA) 420 MG TABS TAKE 1 TABLET (420 MG) BY MOUTH DAILY.   metoprolol tartrate (LOPRESSOR) 25 MG tablet Take 0.5 tablets (12.5 mg total) by mouth 2 (two) times daily. TAKE 1/2 TABLET(12.5 MG) BY MOUTH TWICE DAILY   nitroGLYCERIN (NITROSTAT) 0.4 MG SL tablet PLACE 1 TABLET UNDER THE TONGUE EVERY 5 MINUTES AS NEEDED FOR CHEST PAIN   No facility-administered encounter medications on file as of 08/06/2021.    Follow-up: Return in about 3 months (around 11/04/2021) for HTN.   Lindell Spar, MD

## 2021-08-06 NOTE — Assessment & Plan Note (Signed)
On statin Check lipid profile 

## 2021-08-21 ENCOUNTER — Other Ambulatory Visit (HOSPITAL_COMMUNITY): Payer: Self-pay

## 2021-08-24 ENCOUNTER — Encounter: Payer: Self-pay | Admitting: *Deleted

## 2021-08-24 DIAGNOSIS — Z2821 Immunization not carried out because of patient refusal: Secondary | ICD-10-CM | POA: Insufficient documentation

## 2021-08-29 ENCOUNTER — Other Ambulatory Visit (HOSPITAL_COMMUNITY): Payer: Self-pay

## 2021-09-21 ENCOUNTER — Other Ambulatory Visit (HOSPITAL_COMMUNITY): Payer: Self-pay | Admitting: Physician Assistant

## 2021-09-21 ENCOUNTER — Other Ambulatory Visit (HOSPITAL_COMMUNITY): Payer: Self-pay

## 2021-09-21 DIAGNOSIS — C8300 Small cell B-cell lymphoma, unspecified site: Secondary | ICD-10-CM

## 2021-09-21 MED ORDER — IBRUTINIB 420 MG PO TABS
ORAL_TABLET | ORAL | 3 refills | Status: DC
  Filled 2021-09-21: qty 28, 28d supply, fill #0
  Filled 2021-10-18: qty 28, 28d supply, fill #1
  Filled 2021-11-15: qty 28, 28d supply, fill #2
  Filled 2021-12-18: qty 28, 28d supply, fill #3

## 2021-09-27 ENCOUNTER — Inpatient Hospital Stay (HOSPITAL_COMMUNITY): Payer: Managed Care, Other (non HMO) | Attending: Hematology

## 2021-09-27 ENCOUNTER — Ambulatory Visit (HOSPITAL_COMMUNITY)
Admission: RE | Admit: 2021-09-27 | Discharge: 2021-09-27 | Disposition: A | Discharge status: Home / Self Care | Payer: Managed Care, Other (non HMO) | Source: Ambulatory Visit | Attending: Hematology | Admitting: Hematology

## 2021-09-27 ENCOUNTER — Other Ambulatory Visit (HOSPITAL_COMMUNITY): Payer: Self-pay

## 2021-09-27 ENCOUNTER — Other Ambulatory Visit: Payer: Self-pay

## 2021-09-27 ENCOUNTER — Encounter (HOSPITAL_COMMUNITY): Payer: Self-pay

## 2021-09-27 ENCOUNTER — Other Ambulatory Visit (HOSPITAL_COMMUNITY)
Admission: RE | Admit: 2021-09-27 | Discharge: 2021-09-27 | Disposition: A | Discharge status: Home / Self Care | Payer: Managed Care, Other (non HMO) | Source: Ambulatory Visit | Attending: Internal Medicine | Admitting: Internal Medicine

## 2021-09-27 DIAGNOSIS — D509 Iron deficiency anemia, unspecified: Secondary | ICD-10-CM | POA: Insufficient documentation

## 2021-09-27 DIAGNOSIS — Z79899 Other long term (current) drug therapy: Secondary | ICD-10-CM | POA: Insufficient documentation

## 2021-09-27 DIAGNOSIS — D649 Anemia, unspecified: Secondary | ICD-10-CM | POA: Insufficient documentation

## 2021-09-27 DIAGNOSIS — C9110 Chronic lymphocytic leukemia of B-cell type not having achieved remission: Secondary | ICD-10-CM | POA: Insufficient documentation

## 2021-09-27 DIAGNOSIS — C8300 Small cell B-cell lymphoma, unspecified site: Secondary | ICD-10-CM | POA: Insufficient documentation

## 2021-09-27 DIAGNOSIS — Z87891 Personal history of nicotine dependence: Secondary | ICD-10-CM | POA: Insufficient documentation

## 2021-09-27 LAB — COMPREHENSIVE METABOLIC PANEL
ALT: 32 U/L (ref 0–44)
AST: 29 U/L (ref 15–41)
Albumin: 4.4 g/dL (ref 3.5–5.0)
Alkaline Phosphatase: 59 U/L (ref 38–126)
Anion gap: 9 (ref 5–15)
BUN: 25 mg/dL — ABNORMAL HIGH (ref 8–23)
CO2: 25 mmol/L (ref 22–32)
Calcium: 9 mg/dL (ref 8.9–10.3)
Chloride: 105 mmol/L (ref 98–111)
Creatinine, Ser: 1.17 mg/dL (ref 0.61–1.24)
GFR, Estimated: >60 mL/min (ref >60)
Glucose, Bld: 98 mg/dL (ref 70–99)
Potassium: 3.7 mmol/L (ref 3.5–5.1)
Sodium: 139 mmol/L (ref 135–145)
Total Bilirubin: 1.3 mg/dL — ABNORMAL HIGH (ref 0.3–1.2)
Total Protein: 6.8 g/dL (ref 6.5–8.1)

## 2021-09-27 LAB — CBC WITH DIFFERENTIAL/PLATELET
Abs Immature Granulocytes: 0.09 10*3/uL — ABNORMAL HIGH (ref 0.00–0.07)
Basophils Absolute: 0.1 10*3/uL (ref 0.0–0.1)
Basophils Relative: 1 %
Eosinophils Absolute: 0.1 10*3/uL (ref 0.0–0.5)
Eosinophils Relative: 1 %
HCT: 48.5 % (ref 39.0–52.0)
Hemoglobin: 16.8 g/dL (ref 13.0–17.0)
Immature Granulocytes: 1 %
Lymphocytes Relative: 10 %
Lymphs Abs: 0.7 10*3/uL (ref 0.7–4.0)
MCH: 32.6 pg (ref 26.0–34.0)
MCHC: 34.6 g/dL (ref 30.0–36.0)
MCV: 94.2 fL (ref 80.0–100.0)
Monocytes Absolute: 0.8 10*3/uL (ref 0.1–1.0)
Monocytes Relative: 12 %
Neutro Abs: 5.3 10*3/uL (ref 1.7–7.7)
Neutrophils Relative %: 75 %
Platelets: 145 10*3/uL — ABNORMAL LOW (ref 150–400)
RBC: 5.15 MIL/uL (ref 4.22–5.81)
RDW: 12.3 % (ref 11.5–15.5)
WBC: 7.1 10*3/uL (ref 4.0–10.5)
nRBC: 0 % (ref 0.0–0.2)

## 2021-09-27 LAB — IRON AND TIBC
Iron: 155 ug/dL (ref 45–182)
Saturation Ratios: 40 % — ABNORMAL HIGH (ref 17.9–39.5)
TIBC: 385 ug/dL (ref 250–450)
UIBC: 230 ug/dL

## 2021-09-27 LAB — HEMOGLOBIN A1C
Hgb A1c MFr Bld: 5.7 % — ABNORMAL HIGH (ref 4.8–5.6)
Mean Plasma Glucose: 116.89 mg/dL

## 2021-09-27 LAB — LIPID PANEL
Cholesterol: 112 mg/dL (ref 0–200)
HDL: 28 mg/dL — ABNORMAL LOW (ref >40)
LDL Cholesterol: 60 mg/dL (ref 0–99)
Total CHOL/HDL Ratio: 4 RATIO
Triglycerides: 121 mg/dL (ref <150)
VLDL: 24 mg/dL (ref 0–40)

## 2021-09-27 LAB — LACTATE DEHYDROGENASE: LDH: 173 U/L (ref 98–192)

## 2021-09-27 LAB — FERRITIN: Ferritin: 40 ng/mL (ref 24–336)

## 2021-09-27 MED ORDER — IOHEXOL 300 MG/ML  SOLN
100.0000 mL | Freq: Once | INTRAMUSCULAR | Status: AC | PRN
  Administered 2021-09-27: 100 mL via INTRAVENOUS

## 2021-10-03 ENCOUNTER — Other Ambulatory Visit: Payer: Self-pay

## 2021-10-03 ENCOUNTER — Ambulatory Visit (HOSPITAL_COMMUNITY): Payer: Managed Care, Other (non HMO) | Admitting: Hematology

## 2021-10-03 ENCOUNTER — Inpatient Hospital Stay (HOSPITAL_COMMUNITY): Payer: Managed Care, Other (non HMO) | Admitting: Hematology

## 2021-10-03 VITALS — BP 139/81 | HR 67 | Temp 98.0°F | Resp 18 | Ht 76.0 in | Wt 273.0 lb

## 2021-10-03 DIAGNOSIS — C8300 Small cell B-cell lymphoma, unspecified site: Secondary | ICD-10-CM | POA: Diagnosis not present

## 2021-10-03 DIAGNOSIS — D509 Iron deficiency anemia, unspecified: Secondary | ICD-10-CM

## 2021-10-03 DIAGNOSIS — C9110 Chronic lymphocytic leukemia of B-cell type not having achieved remission: Secondary | ICD-10-CM | POA: Insufficient documentation

## 2021-10-03 DIAGNOSIS — Z87891 Personal history of nicotine dependence: Secondary | ICD-10-CM | POA: Diagnosis not present

## 2021-10-03 DIAGNOSIS — Z79899 Other long term (current) drug therapy: Secondary | ICD-10-CM | POA: Insufficient documentation

## 2021-10-03 DIAGNOSIS — D649 Anemia, unspecified: Secondary | ICD-10-CM | POA: Diagnosis not present

## 2021-10-03 NOTE — Progress Notes (Signed)
? ?[facility] ?[address] ?[place], [place] [address] ? ? ?CLINIC:  ?Medical Oncology/Hematology ? ?PCP:  ?Lindell Spar, MD ?[address] / [place] [address] ?[phone] ? ? ?REASON FOR VISIT:  ?Follow-up for small lymphocytic lymphoma and symptomatic anemia ? ?PRIOR THERAPY: Rituximab monthly from 01/01/2018 to 05/21/2018 ? ?NGS Results: not done ? ?CURRENT THERAPY: Ibrutinib 420 mg daily; intermittent Feraheme last on 09/29/2020 ? 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 CT neck- Bulky adenopathy throughout the neck bilaterally. There also are enlarged parotid lymph nodes bilaterally. There is bilateral axillary adenopathy as well as mediastinal adenopathy. Findings are consistent with lymphoma. Biopsy recommended. ?  ?06/27/2015 Procedure  ? Left neck lymph node biopsy by Dr. Benjamine Mola ?  ?06/27/2015 Pathology Results  ? Diagnosis Lymph node for lymphoma, Left neck node for lymphoma work up - [place].  LOW GRADE. ?  ?06/27/2015 Pathology Results  ? Tissue-Flow Cytometry - MONOCLONAL B CELL POPULATION IDENTIFIED. The phenotypic features are consistent with small lymphocytic lymphoma/chronic lymphocytic leukemia and correlate well with the morphology in the lymph node ?  ?Lymphoma, small lymphocytic ([place])  ?10/14/2017 Initial Diagnosis  ? Lymphoma, small lymphocytic ([place]) ?  ?01/01/2018 - 05/21/2018 Chemotherapy  ? The patient had riTUXimab (RITUXAN) 900 mg in sodium chloride 0.9 % 250 mL (2.6471 mg/mL) infusion, 375 mg/m2 = 900 mg, Intravenous,  Once, 6 of 6 cycles ?Dose modification: 500 mg/m2 (original dose 500 mg/m2, Cycle 2, Reason: Provider Judgment) ?Administration: 900 mg (01/01/2018), 1,300 mg (01/29/2018), 1,300 mg (02/26/2018), 1,300 mg (03/26/2018), 1,300 mg (04/23/2018), 1,300 mg (05/21/2018) ? ? for chemotherapy treatment.  ? ?  ? ? ?CANCER STAGING: ? Cancer Staging  ?No matching staging  information was found for the patient. ? ?INTERVAL HISTORY:  ?[name], a 66 y.o. male, returns for routine follow-up of his small lymphocytic lymphoma and symptomatic anemia. Barrett was last seen on 07/02/2021.  ? ?Today her reports feeling good. He denies current bleeding or bruising and recent infections. He denies cardiac issues. He is taking 1 iron tablet every other day, and he denies constipation.  ? ?REVIEW OF SYSTEMS:  ?Review of Systems  ?Constitutional:  Negative for appetite change and fatigue.  ?HENT:   Negative for nosebleeds.   ?Respiratory:  Negative for hemoptysis.   ?Gastrointestinal:  Negative for constipation.  ?Genitourinary:  Negative for hematuria.   ?Hematological:  Does not bruise/bleed easily.  ?All other systems reviewed and are negative. ? ?PAST MEDICAL/SURGICAL HISTORY:  ?Past Medical History:  ?Diagnosis Date  ? 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 Small Cell Lymphoma  ? Coronary artery disease, non-occlusive 02/2017  ? Cardiac cath in setting of MI: 22 and 55% bifurcation LAD-Diag1  ? Enlarged lymph node   ? left neck  ? STEMI (ST elevation myocardial infarction) ([place]) 03/05/2017  ? hx/notes 03/05/2017 -likely aborted anterior STEMI with 50% bifurcation LAD-Diag1.  No PCI.  Preserved EF  ? Syncope due to orthostatic hypotension 04/21/2018  ? ?Past Surgical History:  ?Procedure Laterality Date  ? BIOPSY  10/10/2020  ? Procedure: BIOPSY;  Surgeon: Harvel Quale, MD;  [facility];  Service: Gastroenterology;;  ? BIOPSY  11/14/2020  ? Procedure: BIOPSY;  Surgeon: Harvel Quale, MD;  [facility];  Service: Gastroenterology;;  ? COLONOSCOPY N/A 10/11/2015  ? Procedure: COLONOSCOPY;  Surgeon: Rogene Houston, MD;  [facility];  Service: Endoscopy;  Laterality: N/A;  10/11/2015  ? COLONOSCOPY WITH PROPOFOL N/A 10/10/2020  ? Procedure: COLONOSCOPY WITH PROPOFOL;  Surgeon: Harvel Quale, MD;  [facility];  Service: Gastroenterology;  Laterality: N/A;  AM  ? ENTEROSCOPY N/A 11/14/2020  ? Procedure: push enteroscopy;  Surgeon: Harvel Quale, MD;  [facility];  Service: Gastroenterology;  Laterality: N/A;  ? ESOPHAGOGASTRODUODENOSCOPY (EGD) WITH PROPOFOL N/A 10/10/2020  ? Procedure: ESOPHAGOGASTRODUODENOSCOPY (EGD) WITH PROPOFOL;  Surgeon: Harvel Quale, MD;  [facility];  Service: Gastroenterology;  Laterality: N/A;  ? ESOPHAGOGASTRODUODENOSCOPY (EGD) WITH PROPOFOL N/A 11/14/2020  ? Procedure: ESOPHAGOGASTRODUODENOSCOPY (EGD) WITH PROPOFOL;  Surgeon: Harvel Quale, MD;  [facility];  Service: Gastroenterology;  Laterality: N/A;  12:00  ? GIVENS CAPSULE STUDY N/A 11/07/2020  ? Procedure: GIVENS CAPSULE STUDY;  Surgeon: Harvel Quale, MD;  [facility];  Service: Gastroenterology;  Laterality: N/A;  7:30 am  ? Graded Exercise Tolerance Test (GXT/ETT)  05/2017  ? 10.7 METs (9: 25 min.  Reached 103% max predicted heart rate).  No EKG findings to suggest coronary ischemia.  Negative, low risk GXT  ? HEMOSTASIS CLIP PLACEMENT  11/14/2020  ? Procedure: HEMOSTASIS CLIP PLACEMENT;  Surgeon: Montez Morita, Quillian Quince, MD;  [facility];  Service: Gastroenterology;;  small bowel nodule  ? LEFT HEART CATH AND CORONARY ANGIOGRAPHY N/A 03/05/2017  ? Procedure: LEFT HEART CATH AND CORONARY ANGIOGRAPHY;  Surgeon: Leonie Man, MD;  Location: [facility];  Service: Cardiovascular: pLAD-Diag1 50-55% (non-flow-limiiting).  EF ~50-55%.  although bifurcation lesion was presumed Culprit - no PCI (not flow limiting). - Med Rx.  ? MASS BIOPSY Left 06/27/2015  ? Procedure: OPEN LEFT NECK BIOPSY ;  Surgeon: Leta Baptist, MD;  Location: [facility];  Service: ENT;  Laterality: Left;  ? POLYPECTOMY  10/10/2020  ? Procedure: POLYPECTOMY;  Surgeon: Montez Morita, Quillian Quince, MD;  [facility];  Service:  Gastroenterology;;  ? REFRACTIVE SURGERY Bilateral   ? ? ?SOCIAL HISTORY:  ?Social History  ? ?Socioeconomic History  ? Marital status: Married  ?  Spouse name: Not on file  ? Number of children: Not on file  ? Years of education: Not on file  ? Highest education level: Not on file  ?Occupational History  ? Not on file  ?Tobacco Use  ? Smoking status: Former  ?  Years: 2.00  ?  Types: Cigarettes  ? Smokeless tobacco: Former  ?  Types: Chew, Snuff  ? Tobacco comments:  ?  03/06/2017 "quit smoking when I was young; quit chew/snuff in ~ 2008"  ?Vaping Use  ? Vaping Use: Never used  ?Substance and Sexual Activity  ? Alcohol use: Yes  ?  Alcohol/week: 2.0 standard drinks  ?  Types: 2 Cans of beer per week  ? Drug use: No  ? Sexual activity: Yes  ?Other Topics Concern  ? Not on file  ?Social History Narrative  ? 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  ? ?Social Determinants of Health  ? ?Financial Resource Strain: Not on file  ?Food Insecurity: Not on file  ?Transportation Needs: Not on file  ?Physical Activity: Not on file  ?Stress: Not on file  ?Social Connections: Not on file  ?Intimate Partner Violence: Not on file  ? ? ?FAMILY HISTORY:  ?Family History  ?Problem Relation Age of Onset  ? Diabetes Mother   ? Cancer Father   ?  Cancer Brother   ? ? ?CURRENT MEDICATIONS:  ?Current Outpatient Medications  ?Medication Sig Dispense Refill  ? ascorbic acid (VITAMIN C) 250 MG tablet Take by mouth.    ? aspirin 81 MG chewable tablet Chew 1 tablet (81 mg total) by mouth every other day. 30 tablet 11  ? atorvastatin (LIPITOR) 40 MG tablet TAKE 1 TABLET(40 MG) BY MOUTH DAILY 90 tablet 3  ? cholecalciferol (VITAMIN D3) 25 MCG (1000 UNIT) tablet Take 10,000 Units by mouth daily.    ? ferrous sulfate 324 (65 Fe) MG TBEC Take 324 mg by mouth every other day.    ? ibrutinib (IMBRUVICA) 420 MG tablet TAKE 1 TABLET (420 MG) BY MOUTH DAILY. 28 tablet 3  ? metoprolol tartrate (LOPRESSOR) 25 MG tablet Take 0.5  tablets (12.5 mg total) by mouth 2 (two) times daily. TAKE 1/2 TABLET(12.5 MG) BY MOUTH TWICE DAILY 90 tablet 3  ? nitroGLYCERIN (NITROSTAT) 0.4 MG SL tablet PLACE 1 TABLET UNDER THE TONGUE EVERY 5 MINUTES AS NEEDED FOR

## 2021-10-03 NOTE — Patient Instructions (Addendum)
[place] at [facility] ?Discharge Instructions ? ?You were seen and examined today by Dr. Delton Coombes. He reviewed your most recent labs and most recent CT scan and everything looks stable. Please keep follow up appointments as scheduled in 3 months. ? ? ?Thank you for choosing [place] at [facility] to provide your oncology and hematology care.  To afford each patient quality time with our provider, please arrive at least 15 minutes before your scheduled appointment time.  ? ?If you have a lab appointment with the [facility] please come in thru the Main Entrance and check in at the main information desk. ? ?You need to re-schedule your appointment should you arrive 10 or more minutes late.  We strive to give you quality time with our providers, and arriving late affects you and other patients whose appointments are after yours.  Also, if you no show three or more times for appointments you may be dismissed from the clinic at the providers discretion.     ?Again, thank you for choosing [facility].  Our hope is that these requests will decrease the amount of time that you wait before being seen by our physicians.       ?_____________________________________________________________ ? ?Should you have questions after your visit to [facility], please contact our office at [phone] and follow the prompts.  Our office hours are 8:00 a.m. and 4:30 p.m. Monday - Friday.  Please note that voicemails left after 4:00 p.m. may not be returned until the following business day.  We are closed weekends and major holidays.  You do have access to a nurse 24-7, just call the main number to the clinic [phone] and do not press any options, hold on the line and a nurse will answer the phone.   ? ?For prescription refill requests, have your pharmacy contact our office and allow 72 hours.   ? ?Due to Covid, you will need to wear a mask upon  entering the hospital. If you do not have a mask, a mask will be given to you at the Main Entrance upon arrival. For doctor visits, patients may have 1 support person age 67 or older with them. For treatment visits, patients can not have anyone with them due to social distancing guidelines and our immunocompromised population.  ? ?  ?

## 2021-10-04 ENCOUNTER — Ambulatory Visit (HOSPITAL_COMMUNITY): Payer: Managed Care, Other (non HMO) | Admitting: Hematology

## 2021-10-18 ENCOUNTER — Other Ambulatory Visit (HOSPITAL_COMMUNITY): Payer: Self-pay

## 2021-10-25 ENCOUNTER — Other Ambulatory Visit (HOSPITAL_COMMUNITY): Payer: Self-pay

## 2021-11-06 ENCOUNTER — Encounter: Payer: Self-pay | Admitting: Internal Medicine

## 2021-11-06 ENCOUNTER — Ambulatory Visit: Payer: Managed Care, Other (non HMO) | Admitting: Internal Medicine

## 2021-11-06 VITALS — BP 138/84 | HR 75 | Resp 18 | Ht 76.0 in | Wt 266.6 lb

## 2021-11-06 DIAGNOSIS — I25119 Atherosclerotic heart disease of native coronary artery with unspecified angina pectoris: Secondary | ICD-10-CM

## 2021-11-06 DIAGNOSIS — D509 Iron deficiency anemia, unspecified: Secondary | ICD-10-CM

## 2021-11-06 DIAGNOSIS — I10 Essential (primary) hypertension: Secondary | ICD-10-CM

## 2021-11-06 DIAGNOSIS — I700 Atherosclerosis of aorta: Secondary | ICD-10-CM | POA: Diagnosis not present

## 2021-11-06 DIAGNOSIS — C8300 Small cell B-cell lymphoma, unspecified site: Secondary | ICD-10-CM | POA: Diagnosis not present

## 2021-11-06 DIAGNOSIS — E785 Hyperlipidemia, unspecified: Secondary | ICD-10-CM

## 2021-11-06 NOTE — Assessment & Plan Note (Signed)
On Imbruvica ?Followed by Oncology ?

## 2021-11-06 NOTE — Assessment & Plan Note (Signed)
Due to GI bleeding in the past, had venofer transfusions ?On iron supplements now ?

## 2021-11-06 NOTE — Assessment & Plan Note (Signed)
Noted on imaging ?On Aspirin and statin ?

## 2021-11-06 NOTE — Progress Notes (Signed)
? ?Established Patient Office Visit ? ?Subjective:  ?Patient ID: Adam Reyes, male    DOB: [birth year]  Age: 66 y.o. MRN: [id number] ? ?CC:  ?Chief Complaint  ?Patient presents with  ? Follow-up  ?  3 month follow up   ? ? ?HPI ?Adam Reyes is a 66 y.o. male with past medical history of CAD, HTN, HLD and lymphocytic lymphoma who presents for f/u of his chronic medical conditions. ? ?HTN: BP is well-controlled. Takes medications regularly. Patient denies headache, dizziness, chest pain, dyspnea or palpitations. ? ?He has h/o CAD and follows up with cardiology.  He is on aspirin, statin and beta-blocker currently.  He keeps nitroglycerin as needed for chest pain.  He recently had nuclear stress test, which was benign. ? ?He sees oncology for h/o lymphocytic lymphoma and takes Imbruvica for it. ? ?Past Medical History:  ?Diagnosis Date  ? Chronic lymphocytic leukemia (CLL), B-cell ([place])   ? Small Cell Lymphoma  ? Coronary artery disease, non-occlusive 02/2017  ? Cardiac cath in setting of MI: 42 and 55% bifurcation LAD-Diag1  ? Enlarged lymph node   ? left neck  ? STEMI (ST elevation myocardial infarction) ([place]) 03/05/2017  ? hx/notes 03/05/2017 -likely aborted anterior STEMI with 50% bifurcation LAD-Diag1.  No PCI.  Preserved EF  ? Syncope due to orthostatic hypotension 04/21/2018  ? ? ?Past Surgical History:  ?Procedure Laterality Date  ? BIOPSY  10/10/2020  ? Procedure: BIOPSY;  Surgeon: Harvel Quale, MD;  [facility];  Service: Gastroenterology;;  ? BIOPSY  11/14/2020  ? Procedure: BIOPSY;  Surgeon: Harvel Quale, MD;  [facility];  Service: Gastroenterology;;  ? COLONOSCOPY N/A 10/11/2015  ? Procedure: COLONOSCOPY;  Surgeon: Rogene Houston, MD;  [facility];  Service: Endoscopy;  Laterality: N/A;  10/11/2015  ? COLONOSCOPY WITH PROPOFOL N/A 10/10/2020  ? Procedure: COLONOSCOPY WITH PROPOFOL;  Surgeon: Harvel Quale, MD;  [facility];   Service: Gastroenterology;  Laterality: N/A;  AM  ? ENTEROSCOPY N/A 11/14/2020  ? Procedure: push enteroscopy;  Surgeon: Harvel Quale, MD;  [facility];  Service: Gastroenterology;  Laterality: N/A;  ? ESOPHAGOGASTRODUODENOSCOPY (EGD) WITH PROPOFOL N/A 10/10/2020  ? Procedure: ESOPHAGOGASTRODUODENOSCOPY (EGD) WITH PROPOFOL;  Surgeon: Harvel Quale, MD;  [facility];  Service: Gastroenterology;  Laterality: N/A;  ? ESOPHAGOGASTRODUODENOSCOPY (EGD) WITH PROPOFOL N/A 11/14/2020  ? Procedure: ESOPHAGOGASTRODUODENOSCOPY (EGD) WITH PROPOFOL;  Surgeon: Harvel Quale, MD;  [facility];  Service: Gastroenterology;  Laterality: N/A;  12:00  ? GIVENS CAPSULE STUDY N/A 11/07/2020  ? Procedure: GIVENS CAPSULE STUDY;  Surgeon: Harvel Quale, MD;  [facility];  Service: Gastroenterology;  Laterality: N/A;  7:30 am  ? Graded Exercise Tolerance Test (GXT/ETT)  05/2017  ? 10.7 METs (9: 25 min.  Reached 103% max predicted heart rate).  No EKG findings to suggest coronary ischemia.  Negative, low risk GXT  ? HEMOSTASIS CLIP PLACEMENT  11/14/2020  ? Procedure: HEMOSTASIS CLIP PLACEMENT;  Surgeon: Montez Morita, Quillian Quince, MD;  [facility];  Service: Gastroenterology;;  small bowel nodule  ? LEFT HEART CATH AND CORONARY ANGIOGRAPHY N/A 03/05/2017  ? Procedure: LEFT HEART CATH AND CORONARY ANGIOGRAPHY;  Surgeon: Leonie Man, MD;  Location: [facility];  Service: Cardiovascular: pLAD-Diag1 50-55% (non-flow-limiiting).  EF ~50-55%.  although bifurcation lesion was presumed Culprit - no PCI (not flow limiting). - Med Rx.  ? MASS  BIOPSY Left 06/27/2015  ? Procedure: OPEN LEFT NECK BIOPSY ;  Surgeon: Leta Baptist, MD;  Location: [facility];  Service: ENT;  Laterality: Left;  ? POLYPECTOMY  10/10/2020  ? Procedure: POLYPECTOMY;  Surgeon: Harvel Quale, MD;  [facility];  Service: Gastroenterology;;  ?  REFRACTIVE SURGERY Bilateral   ? ? ?Family History  ?Problem Relation Age of Onset  ? Diabetes Mother   ? Cancer Father   ? Cancer Brother   ? ? ?Social History  ? ?Socioeconomic History  ? Marital status: Married  ?  Spouse name: Not on file  ? Number of children: Not on file  ? Years of education: Not on file  ? Highest education level: Not on file  ?Occupational History  ? Not on file  ?Tobacco Use  ? Smoking status: Former  ?  Years: 2.00  ?  Types: Cigarettes  ? Smokeless tobacco: Former  ?  Types: Chew, Snuff  ? Tobacco comments:  ?  03/06/2017 "quit smoking when I was young; quit chew/snuff in ~ 2008"  ?Vaping Use  ? Vaping Use: Never used  ?Substance and Sexual Activity  ? Alcohol use: Yes  ?  Alcohol/week: 2.0 standard drinks  ?  Types: 2 Cans of beer per week  ? Drug use: No  ? Sexual activity: Yes  ?Other Topics Concern  ? Not on file  ?Social History Narrative  ? 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  ? ?Social Determinants of Health  ? ?Financial Resource Strain: Not on file  ?Food Insecurity: Not on file  ?Transportation Needs: Not on file  ?Physical Activity: Not on file  ?Stress: Not on file  ?Social Connections: Not on file  ?Intimate Partner Violence: Not on file  ? ? ?Outpatient Medications Prior to Visit  ?Medication Sig Dispense Refill  ? ascorbic acid (VITAMIN C) 250 MG tablet Take by mouth.    ? aspirin 81 MG chewable tablet Chew 1 tablet (81 mg total) by mouth every other day. 30 tablet 11  ? atorvastatin (LIPITOR) 40 MG tablet TAKE 1 TABLET(40 MG) BY MOUTH DAILY 90 tablet 3  ? cholecalciferol (VITAMIN D3) 25 MCG (1000 UNIT) tablet Take 10,000 Units by mouth daily.    ? ferrous sulfate 324 (65 Fe) MG TBEC Take 324 mg by mouth every other day.    ? ibrutinib (IMBRUVICA) 420 MG tablet TAKE 1 TABLET (420 MG) BY MOUTH DAILY. 28 tablet 3  ? metoprolol tartrate (LOPRESSOR) 25 MG tablet Take 0.5 tablets (12.5 mg total) by mouth 2 (two) times daily. TAKE 1/2 TABLET(12.5 MG)  BY MOUTH TWICE DAILY 90 tablet 3  ? nitroGLYCERIN (NITROSTAT) 0.4 MG SL tablet PLACE 1 TABLET UNDER THE TONGUE EVERY 5 MINUTES AS NEEDED FOR CHEST PAIN 25 tablet 2  ? ?No facility-administered medications prior to visit.  ? ? ?No Known Allergies ? ?ROS ?Review of Systems  ?Constitutional:  Negative for chills and fever.  ?HENT:  Negative for congestion and sore throat.   ?Eyes:  Negative for pain and discharge.  ?Respiratory:  Negative for cough and shortness of breath.   ?Cardiovascular:  Negative for chest pain and palpitations.  ?Gastrointestinal:  Negative for constipation, diarrhea, nausea and vomiting.  ?Endocrine: Negative for polydipsia and polyuria.  ?Genitourinary:  Negative for dysuria and hematuria.  ?Musculoskeletal:  Negative for neck pain and neck stiffness.  ?Skin:  Negative for rash.  ?Neurological:  Negative for dizziness, weakness, numbness and headaches.  ?Psychiatric/Behavioral:  Negative for agitation and behavioral problems.   ? ?  ?Objective:  ?  ?Physical Exam ?Vitals reviewed.  ?Constitutional:   ?   General: He is not in acute distress. ?   Appearance: He is not diaphoretic.  ?HENT:  ?   Head: Normocephalic and atraumatic.  ?   Nose: Nose normal.  ?   Mouth/Throat:  ?   Mouth: Mucous membranes are moist.  ?Eyes:  ?   General: No scleral icterus. ?   Extraocular Movements: Extraocular movements intact.  ?Cardiovascular:  ?   Rate and Rhythm: Normal rate and regular rhythm.  ?   Pulses: Normal pulses.  ?   Heart sounds: Normal heart sounds. No murmur heard. ?Pulmonary:  ?   Breath sounds: Normal breath sounds. No wheezing or rales.  ?Musculoskeletal:  ?   Cervical back: Neck supple. No tenderness.  ?   Right lower leg: No edema.  ?   Left lower leg: No edema.  ?Skin: ?   General: Skin is warm.  ?   Findings: No rash.  ?Neurological:  ?   General: No focal deficit present.  ?   Mental Status: He is alert and oriented to person, place, and time.  ?Psychiatric:     ?   Mood and Affect: Mood  normal.     ?   Behavior: Behavior normal.  ? ? ?BP 138/84 (BP Location: Left Arm, Patient Position: Sitting, Cuff Size: Normal)   Pulse 75   Resp 18   Ht '6\' 4"'$  (1.93 m)   Wt 266 lb 9.6 oz (120.9 kg)

## 2021-11-06 NOTE — Assessment & Plan Note (Signed)
BP Readings from Last 1 Encounters:  ?11/06/21 138/84  ? ?Usually well-controlled at home around 120s/70s Counseled for compliance with the medications ?Advised DASH diet and moderate exercise/walking, at least 150 mins/week ? ?

## 2021-11-06 NOTE — Assessment & Plan Note (Signed)
On statin Reviewed lipid profile 

## 2021-11-06 NOTE — Assessment & Plan Note (Signed)
Has had cardiac cath, recent nuclear stress test - benign On Aspirin, statin and Metoprolol Followed by Cardiology 

## 2021-11-06 NOTE — Patient Instructions (Signed)
Please continue taking medications as prescribed.  Please continue to follow low salt diet and ambulate as tolerated. 

## 2021-11-15 ENCOUNTER — Other Ambulatory Visit (HOSPITAL_COMMUNITY): Payer: Self-pay

## 2021-11-26 ENCOUNTER — Other Ambulatory Visit (HOSPITAL_COMMUNITY): Payer: Self-pay

## 2021-12-18 ENCOUNTER — Other Ambulatory Visit (HOSPITAL_COMMUNITY): Payer: Self-pay

## 2021-12-21 ENCOUNTER — Other Ambulatory Visit (HOSPITAL_COMMUNITY): Payer: Self-pay

## 2022-01-03 ENCOUNTER — Inpatient Hospital Stay (HOSPITAL_COMMUNITY): Payer: Managed Care, Other (non HMO) | Attending: Hematology

## 2022-01-03 DIAGNOSIS — D696 Thrombocytopenia, unspecified: Secondary | ICD-10-CM | POA: Insufficient documentation

## 2022-01-03 DIAGNOSIS — D509 Iron deficiency anemia, unspecified: Secondary | ICD-10-CM

## 2022-01-03 DIAGNOSIS — C8300 Small cell B-cell lymphoma, unspecified site: Secondary | ICD-10-CM | POA: Diagnosis not present

## 2022-01-03 LAB — COMPREHENSIVE METABOLIC PANEL
ALT: 25 U/L (ref 0–44)
AST: 23 U/L (ref 15–41)
Albumin: 4.2 g/dL (ref 3.5–5.0)
Alkaline Phosphatase: 56 U/L (ref 38–126)
Anion gap: 5 (ref 5–15)
BUN: 15 mg/dL (ref 8–23)
CO2: 26 mmol/L (ref 22–32)
Calcium: 9.1 mg/dL (ref 8.9–10.3)
Chloride: 107 mmol/L (ref 98–111)
Creatinine, Ser: 0.98 mg/dL (ref 0.61–1.24)
GFR, Estimated: >60 mL/min (ref >60)
Glucose, Bld: 130 mg/dL — ABNORMAL HIGH (ref 70–99)
Potassium: 3.6 mmol/L (ref 3.5–5.1)
Sodium: 138 mmol/L (ref 135–145)
Total Bilirubin: 1.2 mg/dL (ref 0.3–1.2)
Total Protein: 6.6 g/dL (ref 6.5–8.1)

## 2022-01-03 LAB — IRON AND TIBC
Iron: 94 ug/dL (ref 45–182)
Saturation Ratios: 27 % (ref 17.9–39.5)
TIBC: 353 ug/dL (ref 250–450)
UIBC: 259 ug/dL

## 2022-01-03 LAB — LACTATE DEHYDROGENASE: LDH: 170 U/L (ref 98–192)

## 2022-01-03 LAB — CBC WITH DIFFERENTIAL/PLATELET
Abs Immature Granulocytes: 0.06 10*3/uL (ref 0.00–0.07)
Basophils Absolute: 0 10*3/uL (ref 0.0–0.1)
Basophils Relative: 0 %
Eosinophils Absolute: 0.1 10*3/uL (ref 0.0–0.5)
Eosinophils Relative: 1 %
HCT: 46.1 % (ref 39.0–52.0)
Hemoglobin: 15.8 g/dL (ref 13.0–17.0)
Immature Granulocytes: 1 %
Lymphocytes Relative: 9 %
Lymphs Abs: 0.6 10*3/uL — ABNORMAL LOW (ref 0.7–4.0)
MCH: 31.9 pg (ref 26.0–34.0)
MCHC: 34.3 g/dL (ref 30.0–36.0)
MCV: 93.1 fL (ref 80.0–100.0)
Monocytes Absolute: 0.6 10*3/uL (ref 0.1–1.0)
Monocytes Relative: 9 %
Neutro Abs: 5.4 10*3/uL (ref 1.7–7.7)
Neutrophils Relative %: 80 %
Platelets: 133 10*3/uL — ABNORMAL LOW (ref 150–400)
RBC: 4.95 MIL/uL (ref 4.22–5.81)
RDW: 12.4 % (ref 11.5–15.5)
WBC: 6.7 10*3/uL (ref 4.0–10.5)
nRBC: 0 % (ref 0.0–0.2)

## 2022-01-03 LAB — FERRITIN: Ferritin: 33 ng/mL (ref 24–336)

## 2022-01-10 ENCOUNTER — Inpatient Hospital Stay (HOSPITAL_COMMUNITY): Payer: Managed Care, Other (non HMO) | Admitting: Hematology

## 2022-01-10 VITALS — BP 132/92 | HR 78 | Temp 98.2°F | Resp 20 | Ht 75.2 in | Wt 267.2 lb

## 2022-01-10 DIAGNOSIS — D509 Iron deficiency anemia, unspecified: Secondary | ICD-10-CM | POA: Diagnosis not present

## 2022-01-10 DIAGNOSIS — C8300 Small cell B-cell lymphoma, unspecified site: Secondary | ICD-10-CM

## 2022-01-10 NOTE — Progress Notes (Signed)
[place], [place] [address]   CLINIC:  Medical Oncology/Hematology  PCP:  Lindell Spar, MD [address] / [place] [address]  [phone]  REASON FOR VISIT:  Follow-up for small lymphocytic lymphoma and symptomatic anemia  PRIOR THERAPY: Rituximab monthly from 01/01/2018 to 05/21/2018  CURRENT THERAPY: Ibrutinib 420 mg daily; intermittent Feraheme last on 09/29/2020  INTERVAL HISTORY:  Adam Reyes, a 66 y.o. male, returns for routine follow-up for his small lymphocytic lymphoma and symptomatic anemia. [place] was last seen on 10/03/2021.  Today he reports feeling good. He reports 1 bump on his left index finger, and 1 bump on his face which appeared 2 months ago; he denies associated pain. He denies bleeding issues, fevers, and infections. He takes 1 iron tablet every other day. He denies constipation. He denies ankle swellings.   REVIEW OF SYSTEMS:  Review of Systems  Constitutional:  Negative for appetite change, fatigue and fever.  HENT:   Negative for nosebleeds.   Respiratory:  Negative for hemoptysis.   Cardiovascular:  Negative for leg swelling.  Gastrointestinal:  Negative for blood in stool and constipation.  Genitourinary:  Negative for hematuria.   Hematological:  Does not bruise/bleed easily.  All other systems reviewed and are negative.   PAST MEDICAL/SURGICAL HISTORY:  Past Medical History:  Diagnosis Date   Chronic lymphocytic leukemia (CLL), B-cell ([place])    Small Cell Lymphoma   Coronary artery disease, non-occlusive 02/2017   Cardiac cath in setting of MI: 33 and 55% bifurcation LAD-Diag1   Enlarged lymph node    left neck   STEMI (ST elevation myocardial infarction) ([place]) 03/05/2017   hx/notes 03/05/2017 -likely aborted anterior STEMI with 50% bifurcation LAD-Diag1.  No PCI.  Preserved EF   Syncope due to orthostatic hypotension 04/21/2018   Past Surgical History:  Procedure Laterality Date    BIOPSY  10/10/2020   Procedure: BIOPSY;  Surgeon: Harvel Quale, MD;  [facility];  Service: Gastroenterology;;   BIOPSY  11/14/2020   Procedure: BIOPSY;  Surgeon: Harvel Quale, MD;  [facility];  Service: Gastroenterology;;   COLONOSCOPY N/A 10/11/2015   Procedure: COLONOSCOPY;  Surgeon: Rogene Houston, MD;  [facility];  Service: Endoscopy;  Laterality: N/A;  10/11/2015   COLONOSCOPY WITH PROPOFOL N/A 10/10/2020   Procedure: COLONOSCOPY WITH PROPOFOL;  Surgeon: Harvel Quale, MD;  [facility];  Service: Gastroenterology;  Laterality: N/A;  AM   ENTEROSCOPY N/A 11/14/2020   Procedure: push enteroscopy;  Surgeon: Harvel Quale, MD;  [facility];  Service: Gastroenterology;  Laterality: N/A;   ESOPHAGOGASTRODUODENOSCOPY (EGD) WITH PROPOFOL N/A 10/10/2020   Procedure: ESOPHAGOGASTRODUODENOSCOPY (EGD) WITH PROPOFOL;  Surgeon: Harvel Quale, MD;  [facility];  Service: Gastroenterology;  Laterality: N/A;   ESOPHAGOGASTRODUODENOSCOPY (EGD) WITH PROPOFOL N/A 11/14/2020   Procedure: ESOPHAGOGASTRODUODENOSCOPY (EGD) WITH PROPOFOL;  Surgeon: Harvel Quale, MD;  [facility];  Service: Gastroenterology;  Laterality: N/A;  12:00   GIVENS CAPSULE STUDY N/A 11/07/2020   Procedure: GIVENS CAPSULE STUDY;  Surgeon: Harvel Quale, MD;  [facility];  Service: Gastroenterology;  Laterality: N/A;  7:30 am   Graded Exercise Tolerance Test (GXT/ETT)  05/2017   10.7 METs (9: 25 min.  Reached 103% max predicted heart rate).  No EKG findings to suggest coronary ischemia.  Negative, low risk GXT   HEMOSTASIS CLIP PLACEMENT  11/14/2020  Procedure: HEMOSTASIS CLIP PLACEMENT;  Surgeon: Montez Morita, Quillian Quince, MD;  [facility];  Service: Gastroenterology;;  small bowel nodule   LEFT HEART CATH AND CORONARY ANGIOGRAPHY N/A 03/05/2017   Procedure:  LEFT HEART CATH AND CORONARY ANGIOGRAPHY;  Surgeon: Leonie Man, MD;  Location: [facility];  Service: Cardiovascular: pLAD-Diag1 50-55% (non-flow-limiiting).  EF ~50-55%.  although bifurcation lesion was presumed Culprit - no PCI (not flow limiting). - Med Rx.   MASS BIOPSY Left 06/27/2015   Procedure: OPEN LEFT NECK BIOPSY ;  Surgeon: Leta Baptist, MD;  Location: [facility];  Service: ENT;  Laterality: Left;   POLYPECTOMY  10/10/2020   Procedure: POLYPECTOMY;  Surgeon: Harvel Quale, MD;  [facility];  Service: Gastroenterology;;   REFRACTIVE SURGERY Bilateral     SOCIAL HISTORY:  Social History   Socioeconomic History   Marital status: Married    Spouse name: Not on file   Number of children: Not on file   Years of education: Not on file   Highest education level: Not on file  Occupational History   Not on file  Tobacco Use   Smoking status: Former    Years: 2.00    Types: Cigarettes   Smokeless tobacco: Former    Types: Chew, Snuff   Tobacco comments:    03/06/2017 "quit smoking when I was young; quit chew/snuff in ~ 2008"  Vaping Use   Vaping Use: Never used  Substance and Sexual Activity   Alcohol use: Yes    Alcohol/week: 2.0 standard drinks of alcohol    Types: 2 Cans of beer per week   Drug use: No   Sexual activity: Yes  Other Topics Concern   Not on file  Social History Narrative   Married since 2001,third.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   Social Determinants of Health   Financial Resource Strain: Low Risk  (05/16/2020)   Overall Financial Resource Strain (CARDIA)    Difficulty of Paying Living Expenses: Not hard at all  Food Insecurity: No Food Insecurity (05/16/2020)   Hunger Vital Sign    Worried About Running Out of Food in the Last Year: Never true    Ran Out of Food in the Last Year: Never true  Transportation Needs: No Transportation Needs (05/16/2020)   PRAPARE - Radiographer, therapeutic (Medical): No    Lack of Transportation (Non-Medical): No  Physical Activity: Sufficiently Active (05/16/2020)   Exercise Vital Sign    Days of Exercise per Week: 5 days    Minutes of Exercise per Session: 30 min  Stress: No Stress Concern Present (05/16/2020)   [place]    Feeling of Stress : Not at all  Social Connections: Moderately Integrated (05/16/2020)   Social Connection and Isolation Panel [NHANES]    Frequency of Communication with Friends and Family: More than three times a week    Frequency of Social Gatherings with Friends and Family: More than three times a week    Attends Religious Services: More than 4 times per year    Active Member of [employer] or Organizations: No    Attends Archivist Meetings: Never    Marital Status: Married  Human resources officer Violence: Not At Risk (05/16/2020)   Humiliation, Afraid, Rape, and Kick questionnaire    Fear of Current or Ex-Partner: No    Emotionally Abused: No    Physically Abused: No  Sexually Abused: No    FAMILY HISTORY:  Family History  Problem Relation Age of Onset   Diabetes Mother    Cancer Father    Cancer Brother     CURRENT MEDICATIONS:  Current Outpatient Medications  Medication Sig Dispense Refill   ascorbic acid (VITAMIN C) 250 MG tablet Take by mouth.     aspirin 81 MG chewable tablet Chew 1 tablet (81 mg total) by mouth every other day. 30 tablet 11   atorvastatin (LIPITOR) 40 MG tablet TAKE 1 TABLET(40 MG) BY MOUTH DAILY 90 tablet 3   cholecalciferol (VITAMIN D3) 25 MCG (1000 UNIT) tablet Take 10,000 Units by mouth daily.     ferrous sulfate 324 (65 Fe) MG TBEC Take 324 mg by mouth every other day.     ibrutinib (IMBRUVICA) 420 MG tablet TAKE 1 TABLET (420 MG) BY MOUTH DAILY. 28 tablet 3   metoprolol tartrate (LOPRESSOR) 25 MG tablet Take 0.5 tablets (12.5 mg total) by mouth 2 (two) times daily. TAKE 1/2 TABLET(12.5 MG) BY  MOUTH TWICE DAILY 90 tablet 3   nitroGLYCERIN (NITROSTAT) 0.4 MG SL tablet PLACE 1 TABLET UNDER THE TONGUE EVERY 5 MINUTES AS NEEDED FOR CHEST PAIN 25 tablet 2   No current facility-administered medications for this visit.    ALLERGIES:  No Known Allergies  PHYSICAL EXAM:  Performance status (ECOG): 0 - Asymptomatic  There were no vitals filed for this visit. Wt Readings from Last 3 Encounters:  11/06/21 266 lb 9.6 oz (120.9 kg)  10/03/21 273 lb (123.8 kg)  08/06/21 276 lb (125.2 kg)   Physical Exam Vitals reviewed.  Constitutional:      Appearance: Normal appearance. He is obese.  Cardiovascular:     Rate and Rhythm: Normal rate and regular rhythm.     Pulses: Normal pulses.     Heart sounds: Normal heart sounds.  Pulmonary:     Effort: Pulmonary effort is normal.     Breath sounds: Normal breath sounds.  Lymphadenopathy:     Cervical: No cervical adenopathy.     Right cervical: No superficial, deep or posterior cervical adenopathy.    Left cervical: No superficial, deep or posterior cervical adenopathy.     Upper Body:     Right upper body: No supraclavicular or axillary adenopathy.     Left upper body: No supraclavicular or axillary adenopathy.  Neurological:     General: No focal deficit present.     Mental Status: He is alert and oriented to person, place, and time.  Psychiatric:        Mood and Affect: Mood normal.        Behavior: Behavior normal.     LABORATORY DATA:  I have reviewed the labs as listed.     Latest Ref Rng & Units 01/03/2022    3:23 PM 09/27/2021    1:18 PM 05/31/2021    1:03 PM  CBC  WBC 4.0 - 10.5 K/uL 6.7  7.1  7.7   Hemoglobin 13.0 - 17.0 g/dL 15.8  16.8  16.7   Hematocrit 39.0 - 52.0 % 46.1  48.5  48.3   Platelets 150 - 400 K/uL 133  145  139       Latest Ref Rng & Units 01/03/2022    3:23 PM 09/27/2021    1:18 PM 05/31/2021    1:03 PM  CMP  Glucose 70 - 99 mg/dL 130  98  80   BUN 8 - 23 mg/dL 15  25  20   Creatinine 0.61 -  1.24 mg/dL 0.98  1.17  0.98   Sodium 135 - 145 mmol/L 138  139  140   Potassium 3.5 - 5.1 mmol/L 3.6  3.7  3.8   Chloride 98 - 111 mmol/L 107  105  107   CO2 22 - 32 mmol/L $RemoveB'26  25  26   'SdzJHxei$ Calcium 8.9 - 10.3 mg/dL 9.1  9.0  9.5   Total Protein 6.5 - 8.1 g/dL 6.6  6.8  6.6   Total Bilirubin 0.3 - 1.2 mg/dL 1.2  1.3  1.3   Alkaline Phos 38 - 126 U/L 56  59  56   AST 15 - 41 U/L $Remo'23  29  28   'SIXeR$ ALT 0 - 44 U/L 25  32  32       Component Value Date/Time   RBC 4.95 01/03/2022 1523   MCV 93.1 01/03/2022 1523   MCH 31.9 01/03/2022 1523   MCHC 34.3 01/03/2022 1523   RDW 12.4 01/03/2022 1523   LYMPHSABS 0.6 (L) 01/03/2022 1523   MONOABS 0.6 01/03/2022 1523   EOSABS 0.1 01/03/2022 1523   BASOSABS 0.0 01/03/2022 1523    DIAGNOSTIC IMAGING:  I have independently reviewed the scans and discussed with the patient. No results found.   ASSESSMENT:  1.  Stage IV small lymphocytic lymphoma: -CLL FISH panel normal, Ig HV positive, T p53 negative. -Ibrutinib 420 mg started on 12/01/2017, 6 cycles of rituximab from 12/14/2017 through 05/21/2018. -CT CAP on 06/30/2019 showed interval decrease in size of multiple bilateral iliac and pelvic sidewall and inguinal lymph nodes.  Largest left inguinal lymph node measures 1.7 x 1.3 cm.  Additional unchanged enlarged lower paraesophageal and retroperitoneal lymph nodes present.  Multiple stable likely benign small pulmonary nodules.   2.  Health maintenance: - Colonoscopy on 10/10/2020 showed multiple polyps with no bleeding areas. - EGD on 10/10/2020 showed normal esophagus, few gastric polyps, gastric erosions with no stigmata of recent bleeding. - Push enteroscopy on 11/14/2020 did not reveal any bleeding issues.   PLAN:  1.  Stage IV small lymphocytic lymphoma: - CT CAP (09/27/2021): Stable exam with no progressive findings compared to 05/31/2021.  No change in appearance of size of mediastinal, retroperitoneal, pelvic sidewall adenopathy.  Stable bilateral lung  nodules. - He denies any B symptoms or infections. - He reported a bump on the left nasal bridge and left index finger pulp.  He was told to see dermatology if there is any signs of change of color, size, itching or bleeding. - Reviewed CBC today which showed normal white count and differential.  Hemoglobin was normal.  LFTs are normal. - Continue ibrutinib 420 mg daily. - RTC 3 months with repeat labs and CT CAP with contrast.   2.  Mild thrombocytopenia: - He has on and off mild thrombocytopenia from myelosuppression which is stable.   3.  Mild hyperbilirubinemia: - Bilirubin has normalized at 1.2.  He had it elevated previously from [place].   4.  Elevated creatinine: - Creatinine has improved to 0.98 from 1.17 previously.   5.  High risk drug monitoring: - Heart rate today is regular with no sign of atrial fibrillation.   6.  Normocytic anemia: - Continue iron tablet every other day.  Ferritin is 33 and hemoglobin 15.8.  Orders placed this encounter:  No orders of the defined types were placed in this encounter.    Derek Jack, MD [facility] [phone]   I,  Thana Ates, am acting as a Education administrator for Dr. Derek Jack.  I, Derek Jack MD, have reviewed the above documentation for accuracy and completeness, and I agree with the above.

## 2022-01-10 NOTE — Patient Instructions (Signed)
[place] at [facility] Discharge Instructions  You were seen and examined today by Dr. Delton Coombes.  Dr. Delton Coombes discussed your most recent lab work and everything looks good.  Dr. Delton Coombes is scheduling you for CT Chest abdomen and pelvis.  Follow-up as scheduled in 3 months.    Thank you for choosing [place] at [facility] to provide your oncology and hematology care.  To afford each patient quality time with our provider, please arrive at least 15 minutes before your scheduled appointment time.   If you have a lab appointment with the [facility] please come in thru the Main Entrance and check in at the main information desk.  You need to re-schedule your appointment should you arrive 10 or more minutes late.  We strive to give you quality time with our providers, and arriving late affects you and other patients whose appointments are after yours.  Also, if you no show three or more times for appointments you may be dismissed from the clinic at the providers discretion.     Again, thank you for choosing [facility].  Our hope is that these requests will decrease the amount of time that you wait before being seen by our physicians.       _____________________________________________________________  Should you have questions after your visit to [facility], please contact our office at [phone] and follow the prompts.  Our office hours are 8:00 a.m. and 4:30 p.m. Monday - Friday.  Please note that voicemails left after 4:00 p.m. may not be returned until the following business day.  We are closed weekends and major holidays.  You do have access to a nurse 24-7, just call the main number to the clinic [phone] and do not press any options, hold on the line and a nurse will answer the phone.    For prescription refill requests, have your pharmacy contact our office and allow 72 hours.    Due to  Covid, you will need to wear a mask upon entering the hospital. If you do not have a mask, a mask will be given to you at the Main Entrance upon arrival. For doctor visits, patients may have 1 support person age 33 or older with them. For treatment visits, patients can not have anyone with them due to social distancing guidelines and our immunocompromised population.

## 2022-01-16 ENCOUNTER — Other Ambulatory Visit (HOSPITAL_COMMUNITY): Payer: Self-pay | Admitting: Physician Assistant

## 2022-01-16 ENCOUNTER — Other Ambulatory Visit (HOSPITAL_COMMUNITY): Payer: Self-pay

## 2022-01-16 DIAGNOSIS — C8300 Small cell B-cell lymphoma, unspecified site: Secondary | ICD-10-CM

## 2022-01-16 MED ORDER — IBRUTINIB 420 MG PO TABS
ORAL_TABLET | ORAL | 3 refills | Status: DC
  Filled 2022-01-16: qty 28, 28d supply, fill #0
  Filled 2022-02-15: qty 28, 28d supply, fill #1
  Filled 2022-03-13: qty 28, 28d supply, fill #2
  Filled 2022-04-09: qty 28, 28d supply, fill #3

## 2022-01-18 ENCOUNTER — Encounter (HOSPITAL_COMMUNITY): Payer: Self-pay | Admitting: Hematology

## 2022-01-22 ENCOUNTER — Other Ambulatory Visit (HOSPITAL_COMMUNITY): Payer: Self-pay

## 2022-02-15 ENCOUNTER — Other Ambulatory Visit (HOSPITAL_COMMUNITY): Payer: Self-pay

## 2022-02-20 ENCOUNTER — Other Ambulatory Visit (HOSPITAL_COMMUNITY): Payer: Self-pay

## 2022-03-06 NOTE — Progress Notes (Unsigned)
[place] [place], [place] [address]   CLINIC:  Medical Oncology/Hematology  PCP:  Lindell Spar, MD [address] [place] [address] [phone]   REASON FOR VISIT:  Follow-up for small lymphocytic lymphoma and symptomatic anemia   PRIOR THERAPY: Rituximab monthly from 01/01/2018 to 05/21/2018   CURRENT THERAPY: Ibrutinib 420 mg daily; intermittent Feraheme last on 09/29/2020  INTERVAL HISTORY:  Mr. Gabrielsen 66 y.o. male follows with Dr. Delton Coombes for small lymphocytic lymphoma, last seen in clinic on 01/10/2022.  Patient called clinic earlier this week to report some new jaw swelling and presents today for evaluation of this symptom.  ***  *** Jaw swelling (onset, associated pain, infections)  He denies any other new lumps or bumps.  *** He  has not had any fever, chills, night sweats, or unintentional weight loss.  *** He  has not had any extreme fatigue or frequent infections.  *** He  denies symptoms of hyperviscosity such as headache, dizziness, tinnitus, or vision changes.  *** No early satiety or abdominal pain.  *** He  denies any signs or symptoms of blood loss.  *** No chest pain, shortness of breath, syncope, or palpitations.  ***  He has ***% energy and ***% appetite. He endorses that he is maintaining a stable weight.   REVIEW OF SYSTEMS:  ***  Review of Systems - Oncology    PAST MEDICAL/SURGICAL HISTORY:  Past Medical History:  Diagnosis Date   Chronic lymphocytic leukemia (CLL), B-cell ([place])    Small Cell Lymphoma   Coronary artery disease, non-occlusive 02/2017   Cardiac cath in setting of MI: 61 and 55% bifurcation LAD-Diag1   Enlarged lymph node    left neck   STEMI (ST elevation myocardial infarction) ([place]) 03/05/2017   hx/notes 03/05/2017 -likely aborted anterior STEMI with 50% bifurcation LAD-Diag1.  No PCI.  Preserved EF   Syncope due to orthostatic hypotension 04/21/2018   Past Surgical History:  Procedure  Laterality Date   BIOPSY  10/10/2020   Procedure: BIOPSY;  Surgeon: Harvel Quale, MD;  [facility];  Service: Gastroenterology;;   BIOPSY  11/14/2020   Procedure: BIOPSY;  Surgeon: Harvel Quale, MD;  [facility];  Service: Gastroenterology;;   COLONOSCOPY N/A 10/11/2015   Procedure: COLONOSCOPY;  Surgeon: Rogene Houston, MD;  [facility];  Service: Endoscopy;  Laterality: N/A;  10/11/2015   COLONOSCOPY WITH PROPOFOL N/A 10/10/2020   Procedure: COLONOSCOPY WITH PROPOFOL;  Surgeon: Harvel Quale, MD;  [facility];  Service: Gastroenterology;  Laterality: N/A;  AM   ENTEROSCOPY N/A 11/14/2020   Procedure: push enteroscopy;  Surgeon: Harvel Quale, MD;  [facility];  Service: Gastroenterology;  Laterality: N/A;   ESOPHAGOGASTRODUODENOSCOPY (EGD) WITH PROPOFOL N/A 10/10/2020   Procedure: ESOPHAGOGASTRODUODENOSCOPY (EGD) WITH PROPOFOL;  Surgeon: Harvel Quale, MD;  [facility];  Service: Gastroenterology;  Laterality: N/A;   ESOPHAGOGASTRODUODENOSCOPY (EGD) WITH PROPOFOL N/A 11/14/2020   Procedure: ESOPHAGOGASTRODUODENOSCOPY (EGD) WITH PROPOFOL;  Surgeon: Harvel Quale, MD;  [facility];  Service: Gastroenterology;  Laterality: N/A;  12:00   GIVENS CAPSULE STUDY N/A 11/07/2020   Procedure: GIVENS CAPSULE STUDY;  Surgeon: Harvel Quale, MD;  [facility];  Service: Gastroenterology;  Laterality: N/A;  7:30 am   Graded Exercise Tolerance Test (GXT/ETT)  05/2017   10.7 METs (9: 25 min.  Reached 103% max predicted heart rate).  No  EKG findings to suggest coronary ischemia.  Negative, low risk GXT   HEMOSTASIS CLIP PLACEMENT  11/14/2020   Procedure: HEMOSTASIS CLIP PLACEMENT;  Surgeon: Harvel Quale, MD;  [facility];  Service: Gastroenterology;;  small bowel nodule   LEFT HEART CATH AND CORONARY ANGIOGRAPHY N/A  03/05/2017   Procedure: LEFT HEART CATH AND CORONARY ANGIOGRAPHY;  Surgeon: Leonie Man, MD;  Location: [facility];  Service: Cardiovascular: pLAD-Diag1 50-55% (non-flow-limiiting).  EF ~50-55%.  although bifurcation lesion was presumed Culprit - no PCI (not flow limiting). - Med Rx.   MASS BIOPSY Left 06/27/2015   Procedure: OPEN LEFT NECK BIOPSY ;  Surgeon: Leta [facility], MD;  Location: [facility];  Service: ENT;  Laterality: Left;   POLYPECTOMY  10/10/2020   Procedure: POLYPECTOMY;  Surgeon: Harvel Quale, MD;  [facility];  Service: Gastroenterology;;   REFRACTIVE SURGERY Bilateral      SOCIAL HISTORY:  Social History   Socioeconomic History   Marital status: Married    Spouse name: Not on file   Number of children: Not on file   Years of education: Not on file   Highest education level: Not on file  Occupational History   Not on file  Tobacco Use   Smoking status: Former    Years: 2.00    Types: Cigarettes   Smokeless tobacco: Former    Types: Chew, Snuff   Tobacco comments:    03/06/2017 "quit smoking when I was young; quit chew/snuff in ~ 2008"  Vaping Use   Vaping Use: Never used  Substance and Sexual Activity   Alcohol use: Yes    Alcohol/week: 2.0 standard drinks of alcohol    Types: 2 Cans of beer per week   Drug use: No   Sexual activity: Yes  Other Topics Concern   Not on file  Social History Narrative   Married since 2001,third.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   Social Determinants of Health   Financial Resource Strain: Low Risk  (05/16/2020)   Overall Financial Resource Strain (CARDIA)    Difficulty of Paying Living Expenses: Not hard at all  Food Insecurity: No Food Insecurity (05/16/2020)   Hunger Vital Sign    Worried About Running Out of Food in the Last Year: Never true    Ran Out of Food in the Last Year: Never true  Transportation Needs: No Transportation Needs (05/16/2020)   PRAPARE -  Hydrologist (Medical): No    Lack of Transportation (Non-Medical): No  Physical Activity: Sufficiently Active (05/16/2020)   Exercise Vital Sign    Days of Exercise per Week: 5 days    Minutes of Exercise per Session: 30 min  Stress: No Stress Concern Present (05/16/2020)   [place]    Feeling of Stress : Not at all  Social Connections: Moderately Integrated (05/16/2020)   Social Connection and Isolation Panel [NHANES]    Frequency of Communication with Friends and Family: More than three times a week    Frequency of Social Gatherings with Friends and Family: More than three times a week    Attends Religious Services: More than 4 times per year    Active Member of [employer] or Organizations: No    Attends Archivist Meetings: Never    Marital Status: Married  Human resources officer Violence: Not At Risk (05/16/2020)   Humiliation, Afraid, Rape, and Kick questionnaire  Fear of Current or Ex-Partner: No    Emotionally Abused: No    Physically Abused: No    Sexually Abused: No    FAMILY HISTORY:  Family History  Problem Relation Age of Onset   Diabetes Mother    Cancer Father    Cancer Brother     CURRENT MEDICATIONS:  Outpatient Encounter Medications as of 03/07/2022  Medication Sig Note   ascorbic acid (VITAMIN C) 250 MG tablet Take by mouth.    aspirin 81 MG chewable tablet Chew 1 tablet (81 mg total) by mouth every other day.    atorvastatin (LIPITOR) 40 MG tablet TAKE 1 TABLET(40 MG) BY MOUTH DAILY    cholecalciferol (VITAMIN D3) 25 MCG (1000 UNIT) tablet Take 10,000 Units by mouth daily. 01/10/2022: Taking 5,000 every other day   ferrous sulfate 324 (65 Fe) MG TBEC Take 324 mg by mouth every other day.    ibrutinib (IMBRUVICA) 420 MG tablet TAKE 1 TABLET (420 MG) BY MOUTH DAILY.    metoprolol tartrate (LOPRESSOR) 25 MG tablet Take 0.5 tablets (12.5 mg total) by mouth 2 (two)  times daily. TAKE 1/2 TABLET(12.5 MG) BY MOUTH TWICE DAILY    nitroGLYCERIN (NITROSTAT) 0.4 MG SL tablet PLACE 1 TABLET UNDER THE TONGUE EVERY 5 MINUTES AS NEEDED FOR CHEST PAIN    No facility-administered encounter medications on file as of 03/07/2022.    ALLERGIES:  No Known Allergies   PHYSICAL EXAM:  ***  ECOG PERFORMANCE STATUS: {CHL ONC ECOG PS:[phone]}  There were no vitals filed for this visit. There were no vitals filed for this visit. Physical Exam   LABORATORY DATA:  I have reviewed the labs as listed.  CBC    Component Value Date/Time   WBC 6.7 01/03/2022 1523   RBC 4.95 01/03/2022 1523   HGB 15.8 01/03/2022 1523   HCT 46.1 01/03/2022 1523   PLT 133 (L) 01/03/2022 1523   MCV 93.1 01/03/2022 1523   MCH 31.9 01/03/2022 1523   MCHC 34.3 01/03/2022 1523   RDW 12.4 01/03/2022 1523   LYMPHSABS 0.6 (L) 01/03/2022 1523   MONOABS 0.6 01/03/2022 1523   EOSABS 0.1 01/03/2022 1523   BASOSABS 0.0 01/03/2022 1523      Latest Ref Rng & Units 01/03/2022    3:23 PM 09/27/2021    1:18 PM 05/31/2021    1:03 PM  CMP  Glucose 70 - 99 mg/dL 130  98  80   BUN 8 - 23 mg/dL $Remove'15  25  20   'XtSSEwq$ Creatinine 0.61 - 1.24 mg/dL 0.98  1.17  0.98   Sodium 135 - 145 mmol/L 138  139  140   Potassium 3.5 - 5.1 mmol/L 3.6  3.7  3.8   Chloride 98 - 111 mmol/L 107  105  107   CO2 22 - 32 mmol/L $RemoveB'26  25  26   'FZZCqNEv$ Calcium 8.9 - 10.3 mg/dL 9.1  9.0  9.5   Total Protein 6.5 - 8.1 g/dL 6.6  6.8  6.6   Total Bilirubin 0.3 - 1.2 mg/dL 1.2  1.3  1.3   Alkaline Phos 38 - 126 U/L 56  59  56   AST 15 - 41 U/L $Remo'23  29  28   'xjsmn$ ALT 0 - 44 U/L 25  32  32     DIAGNOSTIC IMAGING:  I have independently reviewed the relevant imaging and discussed with the patient.  ASSESSMENT & PLAN: 1.  Stage IV small lymphocytic lymphoma: - CLL FISH panel normal,  Ig HV positive, T p53 negative. - Ibrutinib 420 mg started on 12/01/2017, 6 cycles of rituximab from 12/14/2017 through 05/21/2018. - CT CAP (09/27/2021): Stable exam with  no progressive findings compared to 05/31/2021.  No change in appearance of size of mediastinal, retroperitoneal, pelvic sidewall adenopathy.  Stable bilateral lung nodules. - He denies any B symptoms or infections. ***  - Primary oncologist is Dr. Delton Coombes, last seen by him on 01/10/2022.  Presents to clinic on 03/06/2022 due to jaw swelling *** - Other symptoms *** - Exam *** - Reviewed CBC today *** - PLAN: Discussed with Dr. Delton Coombes, who recommends the following: Additional imaging ***??? Continue treatment 420 mg daily. Scheduled for repeat labs, CT CAP, and MD visit in 1 month   All questions were answered. The patient knows to call the clinic with any problems, questions or concerns.  Medical decision making: ***  Time spent on visit: I spent *** minutes counseling the patient face to face. The total time spent in the appointment was *** minutes and more than 50% was on counseling.   Harriett Rush, PA-C  ***

## 2022-03-07 ENCOUNTER — Other Ambulatory Visit: Payer: Self-pay | Admitting: *Deleted

## 2022-03-07 ENCOUNTER — Inpatient Hospital Stay: Payer: Managed Care, Other (non HMO) | Attending: Physician Assistant | Admitting: Physician Assistant

## 2022-03-07 ENCOUNTER — Inpatient Hospital Stay: Payer: Managed Care, Other (non HMO)

## 2022-03-07 VITALS — BP 143/78 | HR 62 | Temp 98.4°F | Resp 18 | Ht 76.0 in | Wt 269.0 lb

## 2022-03-07 DIAGNOSIS — C8300 Small cell B-cell lymphoma, unspecified site: Secondary | ICD-10-CM | POA: Diagnosis not present

## 2022-03-07 DIAGNOSIS — D649 Anemia, unspecified: Secondary | ICD-10-CM | POA: Insufficient documentation

## 2022-03-07 LAB — CBC
HCT: 46 % (ref 39.0–52.0)
Hemoglobin: 16 g/dL (ref 13.0–17.0)
MCH: 32.3 pg (ref 26.0–34.0)
MCHC: 34.8 g/dL (ref 30.0–36.0)
MCV: 92.9 fL (ref 80.0–100.0)
Platelets: 142 10*3/uL — ABNORMAL LOW (ref 150–400)
RBC: 4.95 MIL/uL (ref 4.22–5.81)
RDW: 12.5 % (ref 11.5–15.5)
WBC: 5.9 10*3/uL (ref 4.0–10.5)
nRBC: 0 % (ref 0.0–0.2)

## 2022-03-07 LAB — LACTATE DEHYDROGENASE: LDH: 160 U/L (ref 98–192)

## 2022-03-07 NOTE — Patient Instructions (Signed)
[place] at [place] **   You were seen today by Tarri Abernethy PA-C for your small lymphocytic lymphoma.  Due to the enlarged lymph node in your right neck, we will add a CT scan of your neck to your upcoming CT scan in September.  You will see Dr. Delton Coombes for follow-up as scheduled in October.  ** Thank you for trusting me with your healthcare!  I strive to provide all of my patients with quality care at each visit.  If you receive a survey for this visit, I would be so grateful to you for taking the time to provide feedback.  Thank you in advance!  ~ Camiyah Friberg                   Dr. Derek Jack   &   Tarri Abernethy, PA-C   - - - - - - - - - - - - - - - - - -    Thank you for choosing [place] at [facility] to provide your oncology and hematology care.  To afford each patient quality time with our provider, please arrive at least 15 minutes before your scheduled appointment time.   If you have a lab appointment with the [facility] please come in thru the Main Entrance and check in at the main information desk.  You need to re-schedule your appointment should you arrive 10 or more minutes late.  We strive to give you quality time with our providers, and arriving late affects you and other patients whose appointments are after yours.  Also, if you no show three or more times for appointments you may be dismissed from the clinic at the providers discretion.     Again, thank you for choosing [facility].  Our hope is that these requests will decrease the amount of time that you wait before being seen by our physicians.       _____________________________________________________________  Should you have questions after your visit to [facility], please contact our office at [phone] and follow the prompts.  Our office hours are 8:00 a.m. and 4:30 p.m.  Monday - Friday.  Please note that voicemails left after 4:00 p.m. may not be returned until the following business day.  We are closed weekends and major holidays.  You do have access to a nurse 24-7, just call the main number to the clinic [phone] and do not press any options, hold on the line and a nurse will answer the phone.    For prescription refill requests, have your pharmacy contact our office and allow 72 hours.

## 2022-03-13 ENCOUNTER — Other Ambulatory Visit (HOSPITAL_COMMUNITY): Payer: Self-pay

## 2022-03-14 ENCOUNTER — Other Ambulatory Visit (HOSPITAL_COMMUNITY): Payer: Self-pay

## 2022-03-15 ENCOUNTER — Other Ambulatory Visit (HOSPITAL_COMMUNITY): Payer: Self-pay

## 2022-03-19 ENCOUNTER — Other Ambulatory Visit (HOSPITAL_COMMUNITY): Payer: Self-pay

## 2022-04-09 ENCOUNTER — Other Ambulatory Visit (HOSPITAL_COMMUNITY): Payer: Self-pay

## 2022-04-11 ENCOUNTER — Inpatient Hospital Stay: Payer: Managed Care, Other (non HMO) | Attending: Physician Assistant

## 2022-04-11 ENCOUNTER — Ambulatory Visit (HOSPITAL_COMMUNITY)
Admission: RE | Admit: 2022-04-11 | Discharge: 2022-04-11 | Disposition: A | Discharge status: Home / Self Care | Payer: Managed Care, Other (non HMO) | Source: Ambulatory Visit | Attending: Hematology | Admitting: Hematology

## 2022-04-11 ENCOUNTER — Other Ambulatory Visit (HOSPITAL_COMMUNITY): Payer: Managed Care, Other (non HMO)

## 2022-04-11 DIAGNOSIS — D509 Iron deficiency anemia, unspecified: Secondary | ICD-10-CM

## 2022-04-11 DIAGNOSIS — C8300 Small cell B-cell lymphoma, unspecified site: Secondary | ICD-10-CM

## 2022-04-11 LAB — CBC WITH DIFFERENTIAL/PLATELET
Abs Immature Granulocytes: 0.1 10*3/uL — ABNORMAL HIGH (ref 0.00–0.07)
Basophils Absolute: 0.1 10*3/uL (ref 0.0–0.1)
Basophils Relative: 1 %
Eosinophils Absolute: 0.1 10*3/uL (ref 0.0–0.5)
Eosinophils Relative: 1 %
HCT: 47.1 % (ref 39.0–52.0)
Hemoglobin: 16.3 g/dL (ref 13.0–17.0)
Immature Granulocytes: 2 %
Lymphocytes Relative: 12 %
Lymphs Abs: 0.8 10*3/uL (ref 0.7–4.0)
MCH: 31.7 pg (ref 26.0–34.0)
MCHC: 34.6 g/dL (ref 30.0–36.0)
MCV: 91.6 fL (ref 80.0–100.0)
Monocytes Absolute: 0.7 10*3/uL (ref 0.1–1.0)
Monocytes Relative: 11 %
Neutro Abs: 4.7 10*3/uL (ref 1.7–7.7)
Neutrophils Relative %: 73 %
Platelets: 146 10*3/uL — ABNORMAL LOW (ref 150–400)
RBC: 5.14 MIL/uL (ref 4.22–5.81)
RDW: 12.2 % (ref 11.5–15.5)
WBC: 6.4 10*3/uL (ref 4.0–10.5)
nRBC: 0 % (ref 0.0–0.2)

## 2022-04-11 LAB — COMPREHENSIVE METABOLIC PANEL
ALT: 26 U/L (ref 0–44)
AST: 23 U/L (ref 15–41)
Albumin: 4.2 g/dL (ref 3.5–5.0)
Alkaline Phosphatase: 57 U/L (ref 38–126)
Anion gap: 12 (ref 5–15)
BUN: 19 mg/dL (ref 8–23)
CO2: 23 mmol/L (ref 22–32)
Calcium: 9 mg/dL (ref 8.9–10.3)
Chloride: 104 mmol/L (ref 98–111)
Creatinine, Ser: 1.03 mg/dL (ref 0.61–1.24)
GFR, Estimated: >60 mL/min (ref >60)
Glucose, Bld: 88 mg/dL (ref 70–99)
Potassium: 3.8 mmol/L (ref 3.5–5.1)
Sodium: 139 mmol/L (ref 135–145)
Total Bilirubin: 1.3 mg/dL — ABNORMAL HIGH (ref 0.3–1.2)
Total Protein: 6.7 g/dL (ref 6.5–8.1)

## 2022-04-11 LAB — LACTATE DEHYDROGENASE: LDH: 168 U/L (ref 98–192)

## 2022-04-11 LAB — IRON AND TIBC
Iron: 127 ug/dL (ref 45–182)
Saturation Ratios: 34 % (ref 17.9–39.5)
TIBC: 375 ug/dL (ref 250–450)
UIBC: 248 ug/dL

## 2022-04-11 LAB — FERRITIN: Ferritin: 41 ng/mL (ref 24–336)

## 2022-04-11 MED ORDER — IOHEXOL 300 MG/ML  SOLN
100.0000 mL | Freq: Once | INTRAMUSCULAR | Status: AC | PRN
  Administered 2022-04-11: 100 mL via INTRAVENOUS

## 2022-04-16 ENCOUNTER — Other Ambulatory Visit (HOSPITAL_COMMUNITY): Payer: Self-pay

## 2022-04-17 ENCOUNTER — Other Ambulatory Visit (HOSPITAL_COMMUNITY): Payer: Self-pay

## 2022-04-18 ENCOUNTER — Inpatient Hospital Stay: Payer: Managed Care, Other (non HMO) | Attending: Physician Assistant | Admitting: Hematology

## 2022-04-18 VITALS — BP 161/93 | HR 74 | Temp 98.5°F | Resp 18 | Wt 269.2 lb

## 2022-04-18 DIAGNOSIS — C9110 Chronic lymphocytic leukemia of B-cell type not having achieved remission: Secondary | ICD-10-CM | POA: Diagnosis not present

## 2022-04-18 DIAGNOSIS — C8300 Small cell B-cell lymphoma, unspecified site: Secondary | ICD-10-CM | POA: Diagnosis not present

## 2022-04-18 DIAGNOSIS — D696 Thrombocytopenia, unspecified: Secondary | ICD-10-CM | POA: Insufficient documentation

## 2022-04-18 DIAGNOSIS — D509 Iron deficiency anemia, unspecified: Secondary | ICD-10-CM

## 2022-04-18 DIAGNOSIS — D649 Anemia, unspecified: Secondary | ICD-10-CM | POA: Diagnosis not present

## 2022-04-18 NOTE — Progress Notes (Signed)
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, a 66 y.o. male, seen for follow-up of for small lymphocytic lymphoma and anemia.  He was previously seen in our clinic for lymph node in the right submandibular region which was found by his dentist.  He does not report any fevers, night sweats or weight loss.  He is continuing to tolerate Ibrutinib very well.  No bleeding issues reported.  He is working full-time job.  REVIEW OF SYSTEMS:  Review of Systems  Hematological:  Does not bruise/bleed easily.  All other systems reviewed and are negative.   PAST MEDICAL/SURGICAL HISTORY:  Past Medical History:  Diagnosis Date   Chronic lymphocytic leukemia (CLL), B-cell ([place])    Small Cell Lymphoma   Coronary artery disease, non-occlusive 02/2017   Cardiac cath in setting of MI: 67 and 55% bifurcation LAD-Diag1   Enlarged lymph node    left neck   STEMI (ST elevation myocardial infarction) ([place]) 03/05/2017   hx/notes 03/05/2017 -likely aborted anterior STEMI with 50% bifurcation LAD-Diag1.  No PCI.  Preserved EF   Syncope due to orthostatic hypotension 04/21/2018   Past Surgical History:  Procedure Laterality Date   BIOPSY  10/10/2020   Procedure: BIOPSY;  Surgeon: Harvel Quale, MD;  [facility];  Service: Gastroenterology;;   BIOPSY  11/14/2020   Procedure: BIOPSY;  Surgeon: Harvel Quale, MD;  [facility];  Service: Gastroenterology;;   COLONOSCOPY N/A 10/11/2015   Procedure: COLONOSCOPY;  Surgeon: Rogene Houston, MD;  [facility];  Service: Endoscopy;  Laterality: N/A;  10/11/2015   COLONOSCOPY WITH PROPOFOL N/A 10/10/2020   Procedure: COLONOSCOPY WITH PROPOFOL;  Surgeon: Harvel Quale, MD;  [facility];  Service: Gastroenterology;  Laterality: N/A;  AM   ENTEROSCOPY N/A 11/14/2020   Procedure: push enteroscopy;  Surgeon: Harvel Quale, MD;  [facility];  Service: Gastroenterology;  Laterality: N/A;   ESOPHAGOGASTRODUODENOSCOPY (EGD) WITH PROPOFOL N/A 10/10/2020   Procedure: ESOPHAGOGASTRODUODENOSCOPY (EGD) WITH PROPOFOL;  Surgeon: Harvel Quale, MD;  [facility];  Service: Gastroenterology;  Laterality: N/A;   ESOPHAGOGASTRODUODENOSCOPY (EGD) WITH PROPOFOL N/A 11/14/2020   Procedure: ESOPHAGOGASTRODUODENOSCOPY (EGD) WITH PROPOFOL;  Surgeon: Harvel Quale, MD;  [facility];  Service: Gastroenterology;  Laterality: N/A;  12:00   GIVENS CAPSULE STUDY N/A 11/07/2020   Procedure: GIVENS CAPSULE STUDY;  Surgeon: Harvel Quale, MD;  [facility];  Service: Gastroenterology;  Laterality: N/A;  7:30 am   Graded Exercise Tolerance Test (GXT/ETT)  05/2017   10.7 METs (9: 25 min.  Reached 103% max predicted heart rate).  No EKG findings to suggest coronary ischemia.  Negative, low risk GXT   HEMOSTASIS CLIP PLACEMENT  11/14/2020   Procedure: HEMOSTASIS CLIP PLACEMENT;  Surgeon: Harvel Quale, MD;  [facility];  Service: Gastroenterology;;  small bowel nodule   LEFT HEART CATH AND CORONARY ANGIOGRAPHY N/A 03/05/2017   Procedure: LEFT HEART CATH AND CORONARY ANGIOGRAPHY;  Surgeon: Leonie Man, MD;  Location: [facility];  Service: Cardiovascular: pLAD-Diag1 50-55% (non-flow-limiiting).  EF ~50-55%.  although bifurcation lesion was presumed Culprit - no PCI (not flow limiting). - Med Rx.   MASS BIOPSY Left 06/27/2015   Procedure: OPEN LEFT NECK BIOPSY ;  Surgeon: Leta Baptist, MD;   Location: [facility];  Service: ENT;  Laterality: Left;   POLYPECTOMY  10/10/2020   Procedure: POLYPECTOMY;  Surgeon: Harvel Quale, MD;  [facility];  Service: Gastroenterology;;   REFRACTIVE SURGERY Bilateral     SOCIAL HISTORY:  Social History   Socioeconomic History   Marital status: Married    Spouse name: Not on file   Number of children: Not on file   Years of education: Not on file   Highest education level: Not on file  Occupational History   Not on file  Tobacco Use   Smoking status: Former    Years: 2.00    Types: Cigarettes   Smokeless tobacco: Former    Types: Chew, Snuff   Tobacco comments:    03/06/2017 "quit smoking when I was young; quit chew/snuff in ~ 2008"  Vaping Use   Vaping Use: Never used  Substance and Sexual Activity   Alcohol use: Yes    Alcohol/week: 2.0 standard drinks of alcohol    Types: 2 Cans of beer per week   Drug use: No   Sexual activity: Yes  Other Topics Concern   Not on file  Social History Narrative   Married since 2001,third.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   Social Determinants of Health   Financial Resource Strain: Low Risk  (05/16/2020)   Overall Financial Resource Strain (CARDIA)    Difficulty of Paying Living Expenses: Not hard at all  Food Insecurity: No Food Insecurity (05/16/2020)   Hunger Vital Sign    Worried About Running Out of Food in the Last Year: Never true    Ran Out of Food in the Last Year: Never true  Transportation Needs: No Transportation Needs (05/16/2020)   PRAPARE - Hydrologist (Medical): No    Lack of Transportation (Non-Medical): No  Physical Activity: Sufficiently Active (05/16/2020)   Exercise Vital Sign    Days of Exercise per Week: 5 days    Minutes of Exercise per Session: 30 min  Stress: No Stress Concern Present (05/16/2020)   [place]    Feeling of  Stress : Not at all  Social Connections: Moderately Integrated (05/16/2020)   Social Connection and Isolation Panel [NHANES]    Frequency of Communication with Friends and Family: More than three times a week    Frequency of Social Gatherings with Friends and Family: More than three times a week    Attends Religious Services: More than 4 times per year    Active Member of [employer] or Organizations: No    Attends Archivist Meetings: Never    Marital Status: Married  Human resources officer Violence: Not At Risk (05/16/2020)   Humiliation, Afraid, Rape, and Kick questionnaire    Fear of Current or Ex-Partner: No    Emotionally Abused: No    Physically Abused: No    Sexually Abused: No    FAMILY HISTORY:  Family History  Problem Relation Age of Onset   Diabetes Mother    Cancer Father    Cancer Brother     CURRENT MEDICATIONS:  Current Outpatient Medications  Medication Sig Dispense Refill   ascorbic acid (VITAMIN C) 250 MG  tablet Take by mouth.     aspirin 81 MG chewable tablet Chew 1 tablet (81 mg total) by mouth every other day. 30 tablet 11   atorvastatin (LIPITOR) 40 MG tablet TAKE 1 TABLET(40 MG) BY MOUTH DAILY 90 tablet 3   cholecalciferol (VITAMIN D3) 25 MCG (1000 UNIT) tablet Take 10,000 Units by mouth daily.     ferrous sulfate 324 (65 Fe) MG TBEC Take 324 mg by mouth every other day.     ibrutinib (IMBRUVICA) 420 MG tablet TAKE 1 TABLET (420 MG) BY MOUTH DAILY. 28 tablet 3   metoprolol tartrate (LOPRESSOR) 25 MG tablet Take 0.5 tablets (12.5 mg total) by mouth 2 (two) times daily. TAKE 1/2 TABLET(12.5 MG) BY MOUTH TWICE DAILY 90 tablet 3   nitroGLYCERIN (NITROSTAT) 0.4 MG SL tablet PLACE 1 TABLET UNDER THE TONGUE EVERY 5 MINUTES AS NEEDED FOR CHEST PAIN 25 tablet 2   No current facility-administered medications for this visit.    ALLERGIES:  No Known Allergies  PHYSICAL EXAM:  Performance status (ECOG): 0 - Asymptomatic  Vitals:   04/18/22 1528  BP: (!) 161/93   Pulse: 74  Resp: 18  Temp: 98.5 F (36.9 C)  SpO2: 96%   Wt Readings from Last 3 Encounters:  04/18/22 269 lb 3.2 oz (122.1 kg)  03/07/22 269 lb (122 kg)  01/10/22 267 lb 3.2 oz (121.2 kg)   Physical Exam Vitals reviewed.  Constitutional:      Appearance: Normal appearance. He is obese.  Cardiovascular:     Rate and Rhythm: Normal rate and regular rhythm.     Pulses: Normal pulses.     Heart sounds: Normal heart sounds.  Pulmonary:     Effort: Pulmonary effort is normal.     Breath sounds: Normal breath sounds.  Lymphadenopathy:     Cervical: No cervical adenopathy.     Right cervical: No superficial, deep or posterior cervical adenopathy.    Left cervical: No superficial, deep or posterior cervical adenopathy.     Upper Body:     Right upper body: No supraclavicular or axillary adenopathy.     Left upper body: No supraclavicular or axillary adenopathy.  Neurological:     General: No focal deficit present.     Mental Status: He is alert and oriented to person, place, and time.  Psychiatric:        Mood and Affect: Mood normal.        Behavior: Behavior normal.     LABORATORY DATA:  I have reviewed the labs as listed.     Latest Ref Rng & Units 04/11/2022    2:23 PM 03/07/2022   10:53 AM 01/03/2022    3:23 PM  CBC  WBC 4.0 - 10.5 K/uL 6.4  5.9  6.7   Hemoglobin 13.0 - 17.0 g/dL 16.3  16.0  15.8   Hematocrit 39.0 - 52.0 % 47.1  46.0  46.1   Platelets 150 - 400 K/uL 146  142  133       Latest Ref Rng & Units 04/11/2022    2:23 PM 01/03/2022    3:23 PM 09/27/2021    1:18 PM  CMP  Glucose 70 - 99 mg/dL 88  130  98   BUN 8 - 23 mg/dL _0 Creatinine 0.61 - 1.24 mg/dL 1.03  0.98  1.17   Sodium 135 - 145 mmol/L 139  138  139   Potassium 3.5 - 5.1 mmol/L 3.8  3.6  3.7   Chloride 98 - 111 mmol/L 104  107  105   CO2 22 - 32 mmol/L _0 Calcium 8.9 - 10.3 mg/dL 9.0  9.1  9.0   Total Protein 6.5 - 8.1 g/dL 6.7  6.6  6.8   Total Bilirubin 0.3 - 1.2  mg/dL 1.3  1.2  1.3   Alkaline Phos 38 - 126 U/L 57  56  59   AST 15 - 41 U/L _1 ALT 0 - 44 U/L 26  25  32       Component Value Date/Time   RBC 5.14 04/11/2022 1423   MCV 91.6 04/11/2022 1423   MCH 31.7 04/11/2022 1423   MCHC 34.6 04/11/2022 1423   RDW 12.2 04/11/2022 1423   LYMPHSABS 0.8 04/11/2022 1423   MONOABS 0.7 04/11/2022 1423   EOSABS 0.1 04/11/2022 1423   BASOSABS 0.1 04/11/2022 1423    DIAGNOSTIC IMAGING:  I have independently reviewed the scans and discussed with the patient. CT SOFT TISSUE NECK W CONTRAST  Result Date: 04/15/2022 CLINICAL DATA:  Monitoring hematologic malignancy.  SLL. EXAM: CT NECK WITH CONTRAST TECHNIQUE: Multidetector CT imaging of the neck was performed using the standard protocol following the bolus administration of intravenous contrast. RADIATION DOSE REDUCTION: This exam was performed according to the departmental dose-optimization program which includes automated exposure control, adjustment of the mA and/or kV according to patient size and/or use of iterative reconstruction technique. CONTRAST:  155m OMNIPAQUE IOHEXOL 300 MG/ML  SOLN COMPARISON:  PET CT 10/28/2017 FINDINGS: Pharynx and larynx: No evidence of mass or thickening of Waldeyer's ring. Salivary glands: No inflammation, mass, or stone. Thyroid: Lobulation and heterogeneity is stable. Lymph nodes: Generalized homogeneous nodal enlargement consistent with history of SLL. Since most recent comparison, there has been generalized improvement. Submandibular nodes have most notably decreased in bulk, a right submandibular node on 3:64 measures 2.4 cm as compared to 3.6 cm. Left submandibular adenopathy is even more impressively regressed. A low left jugular chain node on 3:84 measures 18 mm in length, previously 4.8 cm Vascular: Unremarkable Limited intracranial: Negative. Visualized orbits: Bilateral cataract resection.  Negative for mass Mastoids and visualized paranasal sinuses:  Essentially clear Skeleton: No acute or aggressive finding. Upper chest: Reported separately Other: Dermal inclusion cyst in the left posterior neck with dystrophic calcification. IMPRESSION: Generalized cervical adenopathy correlating with history of lymphoma. There has been generalized improvement since most recent comparison 10/27/2017. Electronically Signed   By: JJorje GuildM.D.   On: 04/15/2022 06:33   CT CHEST ABDOMEN PELVIS W CONTRAST  Result Date: 04/11/2022 CLINICAL DATA:  History of hematologic malignancy lymphoma. Follow-up. * Tracking Code: BO * EXAM: CT CHEST, ABDOMEN, AND PELVIS WITH CONTRAST TECHNIQUE: Multidetector CT imaging of the chest, abdomen and pelvis was performed following the standard protocol during bolus administration of intravenous contrast. RADIATION DOSE REDUCTION: This exam was performed according to the departmental dose-optimization program which includes automated exposure control, adjustment of the mA and/or kV according to patient size and/or use of iterative reconstruction technique. CONTRAST:  1034mOMNIPAQUE IOHEXOL 300 MG/ML  SOLN COMPARISON:  Multiple priors including most recent CT September 27, 2021 FINDINGS: CT CHEST FINDINGS Cardiovascular: Normal caliber thoracic aorta. No central pulmonary embolus on this nondedicated study. Coronary artery calcifications. Normal size heart. No significant pericardial effusion/thickening. Mediastinum/Nodes: Prominent/mildly enlarged mediastinal lymph nodes with the previously indexed subcarinal lymph node now measuring 12 mm in short axis on image  33/3 previously 2.5 cm in short axis. The additional prominent lymph nodes are unchanged for instance a right paratracheal lymph node measuring 9 mm in short axis on image 21/3 is unchanged. No axillary or hilar adenopathy. Retrocrural lymph nodes are similar in size for instance the left retrocrural lymph node measures 16 mm in short axis on image 56/3 previously 17 mm. The esophagus  is grossly unremarkable. Lungs/Pleura: Scattered bilateral pulmonary nodules are similar prior examination. For reference: -perifissural right upper lobe pulmonary nodule measures 5 mm on image 86/4, unchanged. -posterior right lower lobe pulmonary nodule measures 7 mm on image 15/4, unchanged. -subpleural left lower lobe pulmonary nodule measures 4 mm on image 124/4, unchanged. No new suspicious pulmonary nodules or masses. No pleural effusion. No pneumothorax. Musculoskeletal: No aggressive lytic or blastic lesion of bone. CT ABDOMEN PELVIS FINDINGS Hepatobiliary: No suspicious hepatic lesion. Gallbladder is unremarkable. No biliary ductal dilation. Pancreas: No pancreatic ductal dilation or evidence of acute inflammation. Spleen: No splenomegaly. Subtle tiny splenic lesions are similar prior examinations measuring up to 9 mm on image 74/3. Adrenals/Urinary Tract: Bilateral adrenal glands appear normal. Hypodense 6.7 cm left upper/interpolar renal lesion measures fluid density consistent with a cyst and considered benign requiring no independent imaging follow-up. Kidneys demonstrate symmetric enhancement and excretion of contrast material. Urinary bladder is unremarkable for degree of distension. Stomach/Bowel: Radiopaque enteric contrast material traverses the hepatic flexure. No pathologic dilation of small or large bowel. The appendix and terminal ileum appear normal. Left-sided colonic diverticulosis without findings of acute diverticulitis. Vascular/Lymphatic: Aortic atherosclerosis without abdominal aortic aneurysm. Indexed abdominopelvic lymph nodes are as follows: -left periaortic lymph node measures 9 mm in short axis on image 85/3 previously 8 mm. -left external iliac lymph node measures 17 mm in short axis on image 122/3, unchanged. -right external iliac lymph node measures 9 mm in short axis on image 121/3, unchanged. No new areas of abdominopelvic adenopathy. Reproductive: Enlarged prostate gland  similar prior. Other: No significant abdominopelvic free fluid. Musculoskeletal: No aggressive lytic or blastic lesion of bone. 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 Multilevel degenerative changes spine. IMPRESSION: 1. Slight interval decrease in size of the subcarinal lymph node otherwise no significant interval change in the indexed and non indexed lymph nodes above or below the diaphragm. 2. No evidence of new or progressive adenopathy above or below the diaphragm. 3. Normal size spleen with stable subtle hypodense splenic lesions. 4. No significant interval change in the small bilateral pulmonary nodules. Continued attention on follow-up imaging suggested. 5.  Aortic Atherosclerosis (ICD10-I70.0). Electronically Signed   By: Dahlia Bailiff M.D.   On: 04/11/2022 16:54     ASSESSMENT:  1.  Stage IV small lymphocytic lymphoma: -CLL FISH panel normal, Ig HV positive, T p53 negative. -Ibrutinib 420 mg started on 12/01/2017, 6 cycles of rituximab from 12/14/2017 through 05/21/2018. -CT CAP on 06/30/2019 showed interval decrease in size of multiple bilateral iliac and pelvic sidewall and inguinal lymph nodes.  Largest left inguinal lymph node measures 1.7 x 1.3 cm.  Additional unchanged enlarged lower paraesophageal and retroperitoneal lymph nodes present.  Multiple stable likely benign small pulmonary nodules.   2.  Health maintenance: - Colonoscopy on 10/10/2020 showed multiple polyps with no bleeding areas. - EGD on 10/10/2020 showed normal esophagus, few gastric polyps, gastric erosions with no stigmata of recent bleeding. - Push enteroscopy on 11/14/2020 did not reveal any bleeding issues.   PLAN:  1.  Stage IV small lymphocytic lymphoma: -  Physical examination today shows 2 to 3 cm lymph node in the right submandibular region, freely mobile. - Reviewed CT soft tissue neck from 04/11/2022 which showed right submandibular lymph node measures 2.4 cm compared to 3.6 cm from PET  scan from 2019. - CT CAP (04/11/2022): Slight interval decrease in size of subcarinal lymph node and stable lymph nodes below the diaphragm.  No progressive or new adenopathy.  Normal-sized spleen with stable hypodense lesions. - Labs show normal CBC with mild thrombocytopenia and normal LFTs. - I did not recommend any further work-up at this time.  I have recommended that he continue Ibrutinib 1 tablet daily as it is still controlling his disease. - I have recommended follow-up in 4 months.  He wants to come back in 5 months when he gets Medicare starting March of next year.   2.  Mild thrombocytopenia: - He has on and off mild thrombocytopenia from myelosuppression which is stable.  Platelet count today is 146.   3.  Mild hyperbilirubinemia: - This is from Ibrutinib.  Bilirubin is 1.3, slightly above normal.   4.  High risk drug monitoring: - Heart rate is regular with no signs of atrial fibrillation.   5.  Normocytic anemia: - Continue iron tablet every other day.  Hemoglobin is 16.3 and ferritin is 41.  Orders placed this encounter:  Orders Placed This Encounter  Procedures   CBC with Differential/Platelet   Comprehensive metabolic panel   Lactate dehydrogenase   Ferritin   Iron and TIBC      Derek Jack, MD [place] [phone]

## 2022-04-18 NOTE — Patient Instructions (Addendum)
[facility]  Discharge Instructions  You were seen and examined today by Dr. Delton Coombes.  Dr. Delton Coombes discussed your most recent lab work and CT scan which revealed that everything looks good some lymph nodes have improved and some are stable.  Continue taking Ibrutinib as prescribed.  Follow-up as scheduled in 5 months with labs.    Thank you for choosing [facility] to provide your oncology and hematology care.   To afford each patient quality time with our provider, please arrive at least 15 minutes before your scheduled appointment time. You may need to reschedule your appointment if you arrive late (10 or more minutes). Arriving late affects you and other patients whose appointments are after yours.  Also, if you miss three or more appointments without notifying the office, you may be dismissed from the clinic at the provider's discretion.    Again, thank you for choosing [facility].  Our hope is that these requests will decrease the amount of time that you wait before being seen by our physicians.   If you have a lab appointment with the [facility] please come in thru the Main Entrance and check in at the main information desk.           _____________________________________________________________  Should you have questions after your visit to [facility], please contact our office at [phone] and follow the prompts.  Our office hours are 8:00 a.m. to 4:30 p.m. Monday - Thursday and 8:00 a.m. to 2:30 p.m. Friday.  Please note that voicemails left after 4:00 p.m. may not be returned until the following business day.  We are closed weekends and all major holidays.  You do have access to a nurse 24-7, just call the main number to the clinic [phone] and do not press any options, hold on the line and a nurse will answer the phone.    For prescription refill requests, have your pharmacy  contact our office and allow 72 hours.    Masks are optional in the cancer centers. If you would like for your care team to wear a mask while they are taking care of you, please let them know. You may have one support person who is at least 64 years old accompany you for your appointments.

## 2022-05-09 ENCOUNTER — Other Ambulatory Visit (HOSPITAL_COMMUNITY): Payer: Self-pay | Admitting: Hematology

## 2022-05-09 ENCOUNTER — Other Ambulatory Visit: Payer: Self-pay | Admitting: *Deleted

## 2022-05-09 ENCOUNTER — Other Ambulatory Visit (HOSPITAL_COMMUNITY): Payer: Self-pay

## 2022-05-09 DIAGNOSIS — C8300 Small cell B-cell lymphoma, unspecified site: Secondary | ICD-10-CM

## 2022-05-09 MED ORDER — IBRUTINIB 420 MG PO TABS
ORAL_TABLET | ORAL | 3 refills | Status: DC
  Filled 2022-05-09: qty 28, 28d supply, fill #0

## 2022-05-09 NOTE — Telephone Encounter (Signed)
Refill approved for Imbruvica.  Patient tolerating and is to continue therapy.

## 2022-05-14 ENCOUNTER — Other Ambulatory Visit (HOSPITAL_COMMUNITY): Payer: Self-pay

## 2022-05-14 ENCOUNTER — Encounter: Payer: Self-pay | Admitting: Internal Medicine

## 2022-05-14 ENCOUNTER — Ambulatory Visit (INDEPENDENT_AMBULATORY_CARE_PROVIDER_SITE_OTHER): Payer: Managed Care, Other (non HMO) | Admitting: Internal Medicine

## 2022-05-14 VITALS — BP 154/86 | HR 68 | Resp 18 | Ht 76.0 in | Wt 273.2 lb

## 2022-05-14 DIAGNOSIS — I25119 Atherosclerotic heart disease of native coronary artery with unspecified angina pectoris: Secondary | ICD-10-CM | POA: Diagnosis not present

## 2022-05-14 DIAGNOSIS — Z114 Encounter for screening for human immunodeficiency virus [HIV]: Secondary | ICD-10-CM

## 2022-05-14 DIAGNOSIS — I10 Essential (primary) hypertension: Secondary | ICD-10-CM

## 2022-05-14 DIAGNOSIS — Z0001 Encounter for general adult medical examination with abnormal findings: Secondary | ICD-10-CM | POA: Diagnosis not present

## 2022-05-14 DIAGNOSIS — R7303 Prediabetes: Secondary | ICD-10-CM

## 2022-05-14 DIAGNOSIS — E559 Vitamin D deficiency, unspecified: Secondary | ICD-10-CM

## 2022-05-14 DIAGNOSIS — E785 Hyperlipidemia, unspecified: Secondary | ICD-10-CM

## 2022-05-14 DIAGNOSIS — Z2821 Immunization not carried out because of patient refusal: Secondary | ICD-10-CM

## 2022-05-14 MED ORDER — TELMISARTAN 20 MG PO TABS: 20.0000 mg | ORAL_TABLET | Freq: Every day | ORAL | 3 refills | Status: DC

## 2022-05-14 NOTE — Progress Notes (Unsigned)
Established Patient Office Visit  Subjective:  Patient ID: Adam Reyes, male    DOB: [birth year]  Age: 66 y.o. MRN: [id number]  CC:  Chief Complaint  Patient presents with   Annual Exam    Annual exam     HPI Adam Reyes is a 66 y.o. male with past medical history of CAD, HTN, HLD and lymphocytic lymphoma who presents for annual physical.  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  No PCI.  Preserved EF   Syncope due to orthostatic hypotension 04/21/2018    Past Surgical History:  Procedure Laterality Date   BIOPSY  10/10/2020   Procedure: BIOPSY;  Surgeon: Harvel Quale, MD;  [facility];  Service: Gastroenterology;;   BIOPSY  11/14/2020   Procedure: BIOPSY;  Surgeon: Harvel Quale, MD;  [facility];  Service: Gastroenterology;;   COLONOSCOPY N/A 10/11/2015   Procedure: COLONOSCOPY;  Surgeon: Rogene Houston, MD;  [facility];  Service: Endoscopy;  Laterality: N/A;  10/11/2015   COLONOSCOPY WITH PROPOFOL N/A 10/10/2020   Procedure: COLONOSCOPY WITH PROPOFOL;  Surgeon: Harvel Quale, MD;  [facility];  Service: Gastroenterology;  Laterality: N/A;  AM   ENTEROSCOPY N/A 11/14/2020   Procedure: push enteroscopy;  Surgeon: Harvel Quale, MD;  [facility];  Service: Gastroenterology;  Laterality: N/A;   ESOPHAGOGASTRODUODENOSCOPY (EGD) WITH PROPOFOL N/A 10/10/2020   Procedure: ESOPHAGOGASTRODUODENOSCOPY (EGD) WITH PROPOFOL;  Surgeon: Harvel Quale, MD;  [facility];  Service: Gastroenterology;   Laterality: N/A;   ESOPHAGOGASTRODUODENOSCOPY (EGD) WITH PROPOFOL N/A 11/14/2020   Procedure: ESOPHAGOGASTRODUODENOSCOPY (EGD) WITH PROPOFOL;  Surgeon: Harvel Quale, MD;  [facility];  Service: Gastroenterology;  Laterality: N/A;  12:00   GIVENS CAPSULE STUDY N/A 11/07/2020   Procedure: GIVENS CAPSULE STUDY;  Surgeon: Harvel Quale, MD;  [facility];  Service: Gastroenterology;  Laterality: N/A;  7:30 am   Graded Exercise Tolerance Test (GXT/ETT)  05/2017   10.7 METs (9: 25 min.  Reached 103% max predicted heart rate).  No EKG findings to suggest coronary ischemia.  Negative, low risk GXT   HEMOSTASIS CLIP PLACEMENT  11/14/2020   Procedure: HEMOSTASIS CLIP PLACEMENT;  Surgeon: Harvel Quale, MD;  [facility];  Service: Gastroenterology;;  small bowel nodule   LEFT HEART CATH AND CORONARY ANGIOGRAPHY N/A 03/05/2017   Procedure: LEFT HEART CATH AND CORONARY ANGIOGRAPHY;  Surgeon: Leonie Man, MD;  Location: [facility];  Service: Cardiovascular: pLAD-Diag1 50-55% (non-flow-limiiting).  EF ~50-55%.  although bifurcation lesion was presumed Culprit - no PCI (not flow limiting). - Med Rx.   MASS BIOPSY Left 06/27/2015   Procedure: OPEN LEFT NECK BIOPSY ;  Surgeon: Leta Baptist, MD;  Location: [place];  Service: ENT;  Laterality: Left;   POLYPECTOMY  10/10/2020   Procedure: POLYPECTOMY;  Surgeon: Harvel Quale, MD;  [facility];  Service: Gastroenterology;;   REFRACTIVE SURGERY Bilateral     Family History  Problem Relation Age of Onset   Diabetes Mother  Cancer Father    Cancer Brother     Social History   Socioeconomic History   Marital status: Married    Spouse name: Not on file   Number of children: Not on file   Years of education: Not on file   Highest education level: Not on file  Occupational History   Not on file  Tobacco Use   Smoking status: Former    Years:  2.00    Types: Cigarettes   Smokeless tobacco: Former    Types: Chew, Snuff   Tobacco comments:    03/06/2017 "quit smoking when I was young; quit chew/snuff in ~ 2008"  Vaping Use   Vaping Use: Never used  Substance and Sexual Activity   Alcohol use: Yes    Alcohol/week: 2.0 standard drinks of alcohol    Types: 2 Cans of beer per week   Drug use: No   Sexual activity: Yes  Other Topics Concern   Not on file  Social History Narrative   Married since 2001,third.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   Social Determinants of Health   Financial Resource Strain: Low Risk  (05/16/2020)   Overall Financial Resource Strain (CARDIA)    Difficulty of Paying Living Expenses: Not hard at all  Food Insecurity: No Food Insecurity (05/16/2020)   Hunger Vital Sign    Worried About Running Out of Food in the Last Year: Never true    Ran Out of Food in the Last Year: Never true  Transportation Needs: No Transportation Needs (05/16/2020)   PRAPARE - Hydrologist (Medical): No    Lack of Transportation (Non-Medical): No  Physical Activity: Sufficiently Active (05/16/2020)   Exercise Vital Sign    Days of Exercise per Week: 5 days    Minutes of Exercise per Session: 30 min  Stress: No Stress Concern Present (05/16/2020)   [place]    Feeling of Stress : Not at all  Social Connections: Moderately Integrated (05/16/2020)   Social Connection and Isolation Panel [NHANES]    Frequency of Communication with Friends and Family: More than three times a week    Frequency of Social Gatherings with Friends and Family: More than three times a week    Attends Religious Services: More than 4 times per year    Active Member of [employer] or Organizations: No    Attends Archivist Meetings: Never    Marital Status: Married  Human resources officer Violence: Not At Risk (05/16/2020)   Humiliation, Afraid, Rape, and Kick  questionnaire    Fear of Current or Ex-Partner: No    Emotionally Abused: No    Physically Abused: No    Sexually Abused: No    Outpatient Medications Prior to Visit  Medication Sig Dispense Refill   ascorbic acid (VITAMIN C) 250 MG tablet Take by mouth.     aspirin 81 MG chewable tablet Chew 1 tablet (81 mg total) by mouth every other day. 30 tablet 11   atorvastatin (LIPITOR) 40 MG tablet TAKE 1 TABLET(40 MG) BY MOUTH DAILY 90 tablet 3   cholecalciferol (VITAMIN D3) 25 MCG (1000 UNIT) tablet Take 10,000 Units by mouth daily.     ferrous sulfate 324 (65 Fe) MG TBEC Take 324 mg by mouth every other day.     ibrutinib (IMBRUVICA) 420 MG tablet TAKE 1 TABLET (420 MG) BY MOUTH DAILY. 28 tablet 3   metoprolol tartrate (LOPRESSOR) 25 MG tablet Take 0.5  tablets (12.5 mg total) by mouth 2 (two) times daily. TAKE 1/2 TABLET(12.5 MG) BY MOUTH TWICE DAILY 90 tablet 3   nitroGLYCERIN (NITROSTAT) 0.4 MG SL tablet PLACE 1 TABLET UNDER THE TONGUE EVERY 5 MINUTES AS NEEDED FOR CHEST PAIN 25 tablet 2   No facility-administered medications prior to visit.    No Known Allergies  ROS Review of Systems    Objective:    Physical Exam  BP (!) 142/86 (BP Location: Right Arm, Cuff Size: Normal)   Pulse 68   Resp 18   Ht '6\' 4"'$  (1.93 m)   Wt 273 lb 3.2 oz (123.9 kg)   SpO2 98%   BMI 33.25 kg/m  Wt Readings from Last 3 Encounters:  05/14/22 273 lb 3.2 oz (123.9 kg)  04/18/22 269 lb 3.2 oz (122.1 kg)  03/07/22 269 lb (122 kg)    Lab Results  Component Value Date   TSH 1.59 08/31/2020   Lab Results  Component Value Date   WBC 6.4 04/11/2022   HGB 16.3 04/11/2022   HCT 47.1 04/11/2022   MCV 91.6 04/11/2022   PLT 146 (L) 04/11/2022   Lab Results  Component Value Date   NA 139 04/11/2022   K 3.8 04/11/2022   CO2 23 04/11/2022   GLUCOSE 88 04/11/2022   BUN 19 04/11/2022   CREATININE 1.03 04/11/2022   BILITOT 1.3 (H) 04/11/2022   ALKPHOS 57 04/11/2022   AST 23 04/11/2022   ALT 26  04/11/2022   PROT 6.7 04/11/2022   ALBUMIN 4.2 04/11/2022   CALCIUM 9.0 04/11/2022   ANIONGAP 12 04/11/2022   Lab Results  Component Value Date   CHOL 112 09/27/2021   Lab Results  Component Value Date   HDL 28 (L) 09/27/2021   Lab Results  Component Value Date   LDLCALC 60 09/27/2021   Lab Results  Component Value Date   TRIG 121 09/27/2021   Lab Results  Component Value Date   CHOLHDL 4.0 09/27/2021   Lab Results  Component Value Date   HGBA1C 5.7 (H) 09/27/2021      Assessment & Plan:   Problem List Items Addressed This Visit       Other   Refused influenza vaccine - Primary   Other Visit Diagnoses     Encounter for general adult medical examination with abnormal findings           No orders of the defined types were placed in this encounter.   Follow-up: No follow-ups on file.    Lindell Spar, MD

## 2022-05-14 NOTE — Patient Instructions (Addendum)
Please start taking Telmisartan as prescribed.  Please continue taking other medications as prescribed.  Please follow DASH diet and perform moderate exercise/walking at least 150 mins/week.  Please consider getting Shingrix vaccine.

## 2022-05-16 DIAGNOSIS — Z0001 Encounter for general adult medical examination with abnormal findings: Secondary | ICD-10-CM | POA: Insufficient documentation

## 2022-05-16 NOTE — Assessment & Plan Note (Signed)
Physical exam as documented. Fasting blood tests ordered. 

## 2022-05-16 NOTE — Assessment & Plan Note (Signed)
On statin Reviewed lipid profile

## 2022-05-16 NOTE — Assessment & Plan Note (Addendum)
BP Readings from Last 1 Encounters:  05/14/22 (!) 154/86   Uncontrolled with Metoprolol 12.5 mg BID Added Telmisartan 20 mg QD Counseled for compliance with the medications Advised DASH diet and moderate exercise/walking, at least 150 mins/week

## 2022-05-16 NOTE — Assessment & Plan Note (Signed)
Has had cardiac cath, recent nuclear stress test - benign On Aspirin, statin and Metoprolol Followed by Cardiology

## 2022-05-28 ENCOUNTER — Other Ambulatory Visit: Payer: Self-pay | Admitting: *Deleted

## 2022-05-28 DIAGNOSIS — C8300 Small cell B-cell lymphoma, unspecified site: Secondary | ICD-10-CM

## 2022-05-28 MED ORDER — IBRUTINIB 420 MG PO TABS: ORAL_TABLET | ORAL | 3 refills | Status: DC

## 2022-05-28 NOTE — Telephone Encounter (Signed)
Imbruvica refill sent to [facility] per patient's request.  He is tolerating and is to continue therapy.

## 2022-05-30 LAB — VITAMIN D 25 HYDROXY (VIT D DEFICIENCY, FRACTURES): Vit D, 25-Hydroxy: 47.6 ng/mL (ref 30.0–100.0)

## 2022-05-30 LAB — CMP14+EGFR
ALT: 38 IU/L (ref 0–44)
AST: 30 IU/L (ref 0–40)
Albumin/Globulin Ratio: 2.6 — ABNORMAL HIGH (ref 1.2–2.2)
Albumin: 4.6 g/dL (ref 3.9–4.9)
Alkaline Phosphatase: 61 IU/L (ref 44–121)
BUN/Creatinine Ratio: 17 (ref 10–24)
BUN: 19 mg/dL (ref 8–27)
Bilirubin Total: 1.3 mg/dL — ABNORMAL HIGH (ref 0.0–1.2)
CO2: 23 mmol/L (ref 20–29)
Calcium: 9.2 mg/dL (ref 8.6–10.2)
Chloride: 102 mmol/L (ref 96–106)
Creatinine, Ser: 1.09 mg/dL (ref 0.76–1.27)
Globulin, Total: 1.8 g/dL (ref 1.5–4.5)
Glucose: 127 mg/dL — ABNORMAL HIGH (ref 70–99)
Potassium: 3.9 mmol/L (ref 3.5–5.2)
Sodium: 141 mmol/L (ref 134–144)
Total Protein: 6.4 g/dL (ref 6.0–8.5)
eGFR: 76 mL/min/{1.73_m2} (ref >59)

## 2022-05-30 LAB — TSH: TSH: 2.63 u[IU]/mL (ref 0.450–4.500)

## 2022-05-30 LAB — LIPID PANEL
Chol/HDL Ratio: 3.9 ratio (ref 0.0–5.0)
Cholesterol, Total: 118 mg/dL (ref 100–199)
HDL: 30 mg/dL — ABNORMAL LOW (ref >39)
LDL Chol Calc (NIH): 65 mg/dL (ref 0–99)
Triglycerides: 130 mg/dL (ref 0–149)
VLDL Cholesterol Cal: 23 mg/dL (ref 5–40)

## 2022-05-30 LAB — HEMOGLOBIN A1C
Est. average glucose Bld gHb Est-mCnc: 134 mg/dL
Hgb A1c MFr Bld: 6.3 % — ABNORMAL HIGH (ref 4.8–5.6)

## 2022-05-30 LAB — HIV ANTIBODY (ROUTINE TESTING W REFLEX): HIV Screen 4th Generation wRfx: NONREACTIVE

## 2022-05-31 ENCOUNTER — Other Ambulatory Visit: Payer: Self-pay

## 2022-05-31 DIAGNOSIS — C8300 Small cell B-cell lymphoma, unspecified site: Secondary | ICD-10-CM

## 2022-05-31 MED ORDER — IBRUTINIB 420 MG PO TABS: 420.0000 mg | ORAL_TABLET | Freq: Every day | ORAL | 11 refills | Status: DC

## 2022-05-31 NOTE — Telephone Encounter (Signed)
Imbruvica prescription forwarded to [facility] per Dr. Delton Coombes.

## 2022-06-03 ENCOUNTER — Other Ambulatory Visit: Payer: Self-pay | Admitting: Pharmacist

## 2022-06-03 ENCOUNTER — Other Ambulatory Visit (HOSPITAL_COMMUNITY): Payer: Self-pay

## 2022-06-03 DIAGNOSIS — C8300 Small cell B-cell lymphoma, unspecified site: Secondary | ICD-10-CM

## 2022-06-03 MED ORDER — IBRUTINIB 420 MG PO TABS
420.0000 mg | ORAL_TABLET | Freq: Every day | ORAL | 11 refills | Status: DC
  Filled 2022-06-03: qty 56, 56d supply, fill #0
  Filled 2022-06-04: qty 28, 28d supply, fill #0
  Filled 2022-07-09: qty 28, 28d supply, fill #1
  Filled 2022-08-06: qty 28, 28d supply, fill #2
  Filled 2022-09-03: qty 28, 28d supply, fill #3
  Filled 2022-10-04: qty 28, 28d supply, fill #4
  Filled 2022-10-28: qty 28, 28d supply, fill #5
  Filled 2022-11-28: qty 28, 28d supply, fill #6
  Filled 2022-12-31: qty 28, 28d supply, fill #7
  Filled 2023-01-28: qty 28, 28d supply, fill #8

## 2022-06-04 ENCOUNTER — Other Ambulatory Visit (HOSPITAL_COMMUNITY): Payer: Self-pay

## 2022-06-12 ENCOUNTER — Other Ambulatory Visit (HOSPITAL_COMMUNITY): Payer: Self-pay

## 2022-06-13 ENCOUNTER — Other Ambulatory Visit (HOSPITAL_COMMUNITY): Payer: Self-pay

## 2022-06-25 ENCOUNTER — Ambulatory Visit: Payer: Managed Care, Other (non HMO) | Admitting: Internal Medicine

## 2022-06-25 ENCOUNTER — Encounter: Payer: Self-pay | Admitting: Internal Medicine

## 2022-06-25 VITALS — BP 116/64 | HR 76 | Ht 76.0 in | Wt 270.6 lb

## 2022-06-25 DIAGNOSIS — I10 Essential (primary) hypertension: Secondary | ICD-10-CM

## 2022-06-25 DIAGNOSIS — I25119 Atherosclerotic heart disease of native coronary artery with unspecified angina pectoris: Secondary | ICD-10-CM | POA: Diagnosis not present

## 2022-06-25 DIAGNOSIS — E785 Hyperlipidemia, unspecified: Secondary | ICD-10-CM | POA: Diagnosis not present

## 2022-06-25 NOTE — Assessment & Plan Note (Signed)
BP Readings from Last 1 Encounters:  06/25/22 116/64   Well-controlled with Metoprolol 12.5 mg BID and Telmisartan 20 mg QD at home, continue same regimen Counseled for compliance with the medications Advised DASH diet and moderate exercise/walking, at least 150 mins/week

## 2022-06-25 NOTE — Patient Instructions (Signed)
Please continue taking medications as prescribed.  Please continue to follow low salt diet and perform moderate exercise/walking at least 150 mins/week. 

## 2022-06-25 NOTE — Assessment & Plan Note (Signed)
On statin Reviewed lipid profile

## 2022-06-25 NOTE — Progress Notes (Signed)
Established Patient Office Visit  Subjective:  Patient ID: Adam Reyes, male    DOB: 05/12/1956  Age: 66 y.o. MRN: [id number]  CC:  Chief Complaint  Patient presents with   Follow-up    Six week follow up for hypertension     HPI Adam Reyes is a 66 y.o. male with past medical history of CAD, HTN, HLD and lymphocytic lymphoma who presents for f/u of his chronic medical conditions.  HTN: His blood pressure was elevated today, but it has been consistently within normal limits at home.  He has brought his home BP readings, which show around 100s-110s/70s.  He denies any headache, dizziness, chest pain, dyspnea or palpitations.  He has been taking telmisartan and metoprolol regularly.   Past Medical History:  Diagnosis Date   Chronic lymphocytic leukemia (CLL), B-cell ([place])    Small Cell Lymphoma   Coronary artery disease, non-occlusive 02/2017   Cardiac cath in setting of MI: 13 and 55% bifurcation LAD-Diag1   Enlarged lymph node    left neck   STEMI (ST elevation myocardial infarction) ([place]) 03/05/2017   hx/notes 03/05/2017 -likely aborted anterior STEMI with 50% bifurcation LAD-Diag1.  No PCI.  Preserved EF   Syncope due to orthostatic hypotension 04/21/2018    Past Surgical History:  Procedure Laterality Date   BIOPSY  10/10/2020   Procedure: BIOPSY;  Surgeon: Harvel Quale, MD;  [facility];  Service: Gastroenterology;;   BIOPSY  11/14/2020   Procedure: BIOPSY;  Surgeon: Harvel Quale, MD;  [facility];  Service: Gastroenterology;;   COLONOSCOPY N/A 10/11/2015   Procedure: COLONOSCOPY;  Surgeon: Rogene Houston, MD;  [facility];  Service: Endoscopy;  Laterality: N/A;  10/11/2015   COLONOSCOPY WITH PROPOFOL N/A 10/10/2020   Procedure: COLONOSCOPY WITH PROPOFOL;  Surgeon: Harvel Quale, MD;  [facility];  Service: Gastroenterology;  Laterality: N/A;  AM   ENTEROSCOPY N/A 11/14/2020   Procedure:  push enteroscopy;  Surgeon: Harvel Quale, MD;  [facility];  Service: Gastroenterology;  Laterality: N/A;   ESOPHAGOGASTRODUODENOSCOPY (EGD) WITH PROPOFOL N/A 10/10/2020   Procedure: ESOPHAGOGASTRODUODENOSCOPY (EGD) WITH PROPOFOL;  Surgeon: Harvel Quale, MD;  [facility];  Service: Gastroenterology;  Laterality: N/A;   ESOPHAGOGASTRODUODENOSCOPY (EGD) WITH PROPOFOL N/A 11/14/2020   Procedure: ESOPHAGOGASTRODUODENOSCOPY (EGD) WITH PROPOFOL;  Surgeon: Harvel Quale, MD;  [facility];  Service: Gastroenterology;  Laterality: N/A;  12:00   GIVENS CAPSULE STUDY N/A 11/07/2020   Procedure: GIVENS CAPSULE STUDY;  Surgeon: Harvel Quale, MD;  [facility];  Service: Gastroenterology;  Laterality: N/A;  7:30 am   Graded Exercise Tolerance Test (GXT/ETT)  05/2017   10.7 METs (9: 25 min.  Reached 103% max predicted heart rate).  No EKG findings to suggest coronary ischemia.  Negative, low risk GXT   HEMOSTASIS CLIP PLACEMENT  11/14/2020   Procedure: HEMOSTASIS CLIP PLACEMENT;  Surgeon: Harvel Quale, MD;  [facility];  Service: Gastroenterology;;  small bowel nodule   LEFT HEART CATH AND CORONARY ANGIOGRAPHY N/A 03/05/2017   Procedure: LEFT HEART CATH AND CORONARY ANGIOGRAPHY;  Surgeon: Leonie Man, MD;  Location: [facility];  Service: Cardiovascular: pLAD-Diag1 50-55% (non-flow-limiiting).  EF ~50-55%.  although bifurcation lesion was presumed Culprit - no PCI (not flow limiting). - Med Rx.   MASS BIOPSY Left 06/27/2015   Procedure: OPEN LEFT NECK BIOPSY ;  Surgeon: Leta Baptist,  MD;  Location: [place];  Service: ENT;  Laterality: Left;   POLYPECTOMY  10/10/2020   Procedure: POLYPECTOMY;  Surgeon: Harvel Quale, MD;  [facility];  Service: Gastroenterology;;   REFRACTIVE SURGERY Bilateral     Family History  Problem Relation Age of Onset    Diabetes Mother    Cancer Father    Cancer Brother     Social History   Socioeconomic History   Marital status: Married    Spouse name: Not on file   Number of children: Not on file   Years of education: Not on file   Highest education level: Not on file  Occupational History   Not on file  Tobacco Use   Smoking status: Former    Years: 2.00    Types: Cigarettes   Smokeless tobacco: Former    Types: Chew, Snuff   Tobacco comments:    03/06/2017 "quit smoking when I was young; quit chew/snuff in ~ 2008"  Vaping Use   Vaping Use: Never used  Substance and Sexual Activity   Alcohol use: Yes    Alcohol/week: 2.0 standard drinks of alcohol    Types: 2 Cans of beer per week   Drug use: No   Sexual activity: Yes  Other Topics Concern   Not on file  Social History Narrative   Married since 2001,third.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   Social Determinants of Health   Financial Resource Strain: Low Risk  (05/16/2020)   Overall Financial Resource Strain (CARDIA)    Difficulty of Paying Living Expenses: Not hard at all  Food Insecurity: No Food Insecurity (05/16/2020)   Hunger Vital Sign    Worried About Running Out of Food in the Last Year: Never true    Ran Out of Food in the Last Year: Never true  Transportation Needs: No Transportation Needs (05/16/2020)   PRAPARE - Hydrologist (Medical): No    Lack of Transportation (Non-Medical): No  Physical Activity: Sufficiently Active (05/16/2020)   Exercise Vital Sign    Days of Exercise per Week: 5 days    Minutes of Exercise per Session: 30 min  Stress: No Stress Concern Present (05/16/2020)   [place]    Feeling of Stress : Not at all  Social Connections: Moderately Integrated (05/16/2020)   Social Connection and Isolation Panel [NHANES]    Frequency of Communication with Friends and Family: More than three times a week     Frequency of Social Gatherings with Friends and Family: More than three times a week    Attends Religious Services: More than 4 times per year    Active Member of [employer] or Organizations: No    Attends Archivist Meetings: Never    Marital Status: Married  Human resources officer Violence: Not At Risk (05/16/2020)   Humiliation, Afraid, Rape, and Kick questionnaire    Fear of Current or Ex-Partner: No    Emotionally Abused: No    Physically Abused: No    Sexually Abused: No    Outpatient Medications Prior to Visit  Medication Sig Dispense Refill   ascorbic acid (VITAMIN C) 250 MG tablet Take by mouth.     aspirin 81 MG chewable tablet Chew 1 tablet (81 mg total) by mouth every other day. 30 tablet 11   atorvastatin (LIPITOR) 40 MG tablet TAKE 1 TABLET(40 MG) BY MOUTH DAILY 90 tablet 3   cholecalciferol (VITAMIN D3)  25 MCG (1000 UNIT) tablet Take 10,000 Units by mouth daily.     ferrous sulfate 324 (65 Fe) MG TBEC Take 324 mg by mouth every other day.     ibrutinib (IMBRUVICA) 420 MG tablet Take 1 tablet (420 mg total) by mouth daily. 30 tablet 11   metoprolol tartrate (LOPRESSOR) 25 MG tablet Take 0.5 tablets (12.5 mg total) by mouth 2 (two) times daily. TAKE 1/2 TABLET(12.5 MG) BY MOUTH TWICE DAILY 90 tablet 3   nitroGLYCERIN (NITROSTAT) 0.4 MG SL tablet PLACE 1 TABLET UNDER THE TONGUE EVERY 5 MINUTES AS NEEDED FOR CHEST PAIN 25 tablet 2   telmisartan (MICARDIS) 20 MG tablet Take 1 tablet (20 mg total) by mouth daily. 30 tablet 3   No facility-administered medications prior to visit.    No Known Allergies  ROS Review of Systems  Constitutional:  Negative for chills and fever.  HENT:  Negative for congestion and sore throat.   Eyes:  Negative for pain and discharge.  Respiratory:  Negative for cough and shortness of breath.   Cardiovascular:  Negative for chest pain and palpitations.  Gastrointestinal:  Negative for constipation, diarrhea, nausea and vomiting.  Endocrine:  Negative for polydipsia and polyuria.  Genitourinary:  Negative for dysuria and hematuria.  Musculoskeletal:  Negative for neck pain and neck stiffness.  Skin:  Negative for rash.  Neurological:  Negative for dizziness, weakness, numbness and headaches.  Psychiatric/Behavioral:  Negative for agitation and behavioral problems.       Objective:    Physical Exam Vitals reviewed.  Constitutional:      General: He is not in acute distress.    Appearance: He is not diaphoretic.  HENT:     Head: Normocephalic and atraumatic.     Nose: Nose normal.     Mouth/Throat:     Mouth: Mucous membranes are moist.  Eyes:     General: No scleral icterus.    Extraocular Movements: Extraocular movements intact.  Cardiovascular:     Rate and Rhythm: Normal rate and regular rhythm.     Pulses: Normal pulses.     Heart sounds: Normal heart sounds. No murmur heard. Pulmonary:     Breath sounds: Normal breath sounds. No wheezing or rales.  Abdominal:     Palpations: Abdomen is soft.     Tenderness: There is no abdominal tenderness.  Musculoskeletal:     Cervical back: Neck supple. No tenderness.     Right lower leg: No edema.     Left lower leg: No edema.  Lymphadenopathy:     Cervical: Cervical adenopathy present.  Skin:    General: Skin is warm.     Findings: No rash.  Neurological:     General: No focal deficit present.     Mental Status: He is alert and oriented to person, place, and time.  Psychiatric:        Mood and Affect: Mood normal.        Behavior: Behavior normal.     BP 116/64 (BP Location: Left Arm, Cuff Size: Normal)   Pulse 76   Ht _0  (1.93 m)   Wt 270 lb 9.6 oz (122.7 kg)   SpO2 94%   BMI 32.94 kg/m  Wt Readings from Last 3 Encounters:  06/25/22 270 lb 9.6 oz (122.7 kg)  05/14/22 273 lb 3.2 oz (123.9 kg)  04/18/22 269 lb 3.2 oz (122.1 kg)    Lab Results  Component Value Date   TSH 2.630 05/29/2022  Lab Results  Component Value Date   WBC 6.4  04/11/2022   HGB 16.3 04/11/2022   HCT 47.1 04/11/2022   MCV 91.6 04/11/2022   PLT 146 (L) 04/11/2022   Lab Results  Component Value Date   NA 141 05/29/2022   K 3.9 05/29/2022   CO2 23 05/29/2022   GLUCOSE 127 (H) 05/29/2022   BUN 19 05/29/2022   CREATININE 1.09 05/29/2022   BILITOT 1.3 (H) 05/29/2022   ALKPHOS 61 05/29/2022   AST 30 05/29/2022   ALT 38 05/29/2022   PROT 6.4 05/29/2022   ALBUMIN 4.6 05/29/2022   CALCIUM 9.2 05/29/2022   ANIONGAP 12 04/11/2022   EGFR 76 05/29/2022   Lab Results  Component Value Date   CHOL 118 05/29/2022   Lab Results  Component Value Date   HDL 30 (L) 05/29/2022   Lab Results  Component Value Date   LDLCALC 65 05/29/2022   Lab Results  Component Value Date   TRIG 130 05/29/2022   Lab Results  Component Value Date   CHOLHDL 3.9 05/29/2022   Lab Results  Component Value Date   HGBA1C 6.3 (H) 05/29/2022      Assessment & Plan:   Problem List Items Addressed This Visit       Cardiovascular and Mediastinum   Coronary artery disease involving native coronary artery of native heart with angina pectoris ([place]) (Chronic)    Has had cardiac cath, recent nuclear stress test - benign On Aspirin, statin and Metoprolol Followed by Cardiology      Essential hypertension - Primary    BP Readings from Last 1 Encounters:  06/25/22 116/64  Well-controlled with Metoprolol 12.5 mg BID and Telmisartan 20 mg QD at home, continue same regimen Counseled for compliance with the medications Advised DASH diet and moderate exercise/walking, at least 150 mins/week         Other   Dyslipidemia, goal LDL below 70 (Chronic)    On statin Reviewed lipid profile       No orders of the defined types were placed in this encounter.   Follow-up: Return in about 3 months (around 09/24/2022) for HTN.    Lindell Spar, MD

## 2022-06-25 NOTE — Assessment & Plan Note (Signed)
Has had cardiac cath, recent nuclear stress test - benign On Aspirin, statin and Metoprolol Followed by Cardiology

## 2022-07-03 ENCOUNTER — Ambulatory Visit (INDEPENDENT_AMBULATORY_CARE_PROVIDER_SITE_OTHER): Payer: Managed Care, Other (non HMO) | Admitting: Internal Medicine

## 2022-07-03 ENCOUNTER — Telehealth: Payer: Self-pay | Admitting: *Deleted

## 2022-07-03 ENCOUNTER — Encounter: Payer: Self-pay | Admitting: Internal Medicine

## 2022-07-03 DIAGNOSIS — D849 Immunodeficiency, unspecified: Secondary | ICD-10-CM

## 2022-07-03 DIAGNOSIS — U071 Immunodeficiency, unspecified: Secondary | ICD-10-CM | POA: Insufficient documentation

## 2022-07-03 MED ORDER — NIRMATRELVIR/RITONAVIR (PAXLOVID)TABLET: 3.0000 | ORAL_TABLET | Freq: Two times a day (BID) | ORAL | 0 refills | Status: DC

## 2022-07-03 NOTE — Patient Instructions (Addendum)
Thank you, Mr.[name] for allowing [place] to provide your care today. Today we discussed Covid -19 infection.     I have ordered the following medication/changed the following medications:     Start the following medications:  Take nirmatrelvir (150 mg) two tablets twice daily for 5 days and ritonavir (100 mg) one tablet twice daily for 5 days.  Dispense: 30 tablet; Refill: 0    Follow up:  If not improving       Remember: Do not take Ibrutinib until day after you finish nirmatrelvir    Tamsen Snider, M.D.

## 2022-07-03 NOTE — Progress Notes (Signed)
     This is a telephone encounter between [name] and [name] on 07/03/2022 for positive test. The visit was conducted with the patient located at home and [name] at [facility]. The patient's identity was confirmed using their DOB and current address. The patient has consented to being evaluated through a telephone encounter and understands the associated risks (an examination cannot be done and the patient may need to come in for an appointment) / benefits (allows the patient to remain at home, decreasing exposure to coronavirus).    CC: positive covid test  HPI:[name] is a 66 y.o. male with significant history including but not limited to CLL on ibrutinib. Patient started to have stuffiness and cough two days ago. Patient came in contact with someone at church and person later tested positive for Covid. Patient tested himself and his test is positive. He has no difficulty breathing. He called oncologist and oncologist is okay with stopping ibrutinib while on Paxlovid.  Past Medical History:  Diagnosis Date   Chronic lymphocytic leukemia (CLL), B-cell ([place])    Small Cell Lymphoma   Coronary artery disease, non-occlusive 02/2017   Cardiac cath in setting of MI: 5 and 55% bifurcation LAD-Diag1   Enlarged lymph node    left neck   STEMI (ST elevation myocardial infarction) ([place]) 03/05/2017   hx/notes 03/05/2017 -likely aborted anterior STEMI with 50% bifurcation LAD-Diag1.  No PCI.  Preserved EF   Syncope due to orthostatic hypotension 04/21/2018   Assessment & Plan:   COVID-19 in immunocompromised patient ([place]) - Mild to moderate symptoms in immunocompromised patient - stop ibrutinib while taking Paxlovid - nirmatrelvir/ritonavir (PAXLOVID) 20 x 150 MG & 10 x '100MG'$  TABS; Take 3 tablets by mouth 2 (two) times daily for 5 days. Patient GFR is >60. Take nirmatrelvir (150 mg) two tablets twice daily for 5 days and ritonavir (100 mg) one tablet twice daily for 5 days.   Dispense: 30 tablet; Refill: 0 - Call if condition worsens or seek emergent care if needed    Time:   Today, I have spent 15 minutes with the patient with telehealth technology discussing the above problems.  [name], MD

## 2022-07-03 NOTE — Telephone Encounter (Signed)
Patient called to make [place] aware the he tested positive for COVID at home today. Encouraged to see PAP.  Advised that if he is prescribed Paxlovid he should hold his Imbruvica while on therapy.  Verbalized understanding.

## 2022-07-03 NOTE — Assessment & Plan Note (Addendum)
-   Mild to moderate symptoms in immunocompromised patient - stop ibrutinib while taking Paxlovid, resume day after finishing Paxlovid - nirmatrelvir/ritonavir (PAXLOVID) 20 x 150 MG & 10 x '100MG'$  TABS; Take 3 tablets by mouth 2 (two) times daily for 5 days. Patient GFR is >60. Take nirmatrelvir (150 mg) two tablets twice daily for 5 days and ritonavir (100 mg) one tablet twice daily for 5 days.  Dispense: 30 tablet; Refill: 0 - Call if condition worsens or seek emergent care if needed

## 2022-07-04 ENCOUNTER — Telehealth: Payer: Self-pay | Admitting: Internal Medicine

## 2022-07-04 ENCOUNTER — Other Ambulatory Visit: Payer: Self-pay

## 2022-07-04 ENCOUNTER — Other Ambulatory Visit (HOSPITAL_COMMUNITY): Payer: Self-pay

## 2022-07-04 DIAGNOSIS — U071 COVID-19: Secondary | ICD-10-CM

## 2022-07-04 MED ORDER — NIRMATRELVIR/RITONAVIR (PAXLOVID)TABLET: 3.0000 | ORAL_TABLET | Freq: Two times a day (BID) | ORAL | 0 refills | Status: DC

## 2022-07-04 NOTE — Telephone Encounter (Signed)
Rx resent.

## 2022-07-04 NOTE — Telephone Encounter (Signed)
Patient called in pharm has not received   nirmatrelvir/ritonavir (PAXLOVID) 20 x 150 MG & 10 x '100MG'$  TABS    [facility] [phone] - [place], [place] AT [place] [address], [place] [address] Phone: [phone]  Fax: [phone] DEA #: [id number]

## 2022-07-05 ENCOUNTER — Other Ambulatory Visit: Payer: Self-pay | Admitting: Internal Medicine

## 2022-07-05 ENCOUNTER — Telehealth: Payer: Self-pay | Admitting: Internal Medicine

## 2022-07-05 DIAGNOSIS — D849 Immunodeficiency, unspecified: Secondary | ICD-10-CM

## 2022-07-05 MED ORDER — NIRMATRELVIR/RITONAVIR (PAXLOVID)TABLET: 3.0000 | ORAL_TABLET | Freq: Two times a day (BID) | ORAL | 0 refills | Status: AC

## 2022-07-05 NOTE — Telephone Encounter (Signed)
Medication sent patient aware see other tele msg

## 2022-07-05 NOTE — Telephone Encounter (Signed)
Patient called pharmacy still has not yet received this medicine, patient said he needs it. nirmatrelvir/ritonavir (PAXLOVID) 20 x 150 MG & 10 x '100MG'$  TABS [[id number]]   Pharmacy: [facility]

## 2022-07-06 ENCOUNTER — Other Ambulatory Visit: Payer: Self-pay | Admitting: Adult Health

## 2022-07-09 ENCOUNTER — Other Ambulatory Visit: Payer: Self-pay

## 2022-07-10 ENCOUNTER — Other Ambulatory Visit (HOSPITAL_COMMUNITY): Payer: Self-pay

## 2022-07-11 ENCOUNTER — Other Ambulatory Visit (HOSPITAL_COMMUNITY): Payer: Self-pay

## 2022-08-06 ENCOUNTER — Other Ambulatory Visit (HOSPITAL_COMMUNITY): Payer: Self-pay

## 2022-08-06 ENCOUNTER — Encounter (HOSPITAL_COMMUNITY): Payer: Self-pay | Admitting: Hematology

## 2022-08-09 ENCOUNTER — Other Ambulatory Visit (HOSPITAL_COMMUNITY): Payer: Self-pay

## 2022-08-09 ENCOUNTER — Encounter (HOSPITAL_COMMUNITY): Payer: Self-pay | Admitting: Hematology

## 2022-08-11 ENCOUNTER — Other Ambulatory Visit: Payer: Self-pay | Admitting: Internal Medicine

## 2022-08-11 DIAGNOSIS — I10 Essential (primary) hypertension: Secondary | ICD-10-CM

## 2022-08-12 ENCOUNTER — Other Ambulatory Visit (HOSPITAL_COMMUNITY): Payer: Self-pay

## 2022-08-12 ENCOUNTER — Telehealth: Payer: Self-pay

## 2022-08-12 ENCOUNTER — Other Ambulatory Visit: Payer: Self-pay

## 2022-08-12 NOTE — Telephone Encounter (Signed)
Oral Oncology Patient Advocate Encounter   Received notification that patient had new insurance and that prior authorization for Kate Sable is required.   PA submitted on 08/12/2022  Faxed to [facility] at [phone]  Status is pending     Berdine Addison, [place] [facility] Patient Progress  [phone] (phone) [phone] (fax) 08/12/2022 10:43 AM

## 2022-08-16 ENCOUNTER — Other Ambulatory Visit (HOSPITAL_COMMUNITY): Payer: Self-pay

## 2022-08-16 NOTE — Telephone Encounter (Signed)
Ran test claim due to not hearing back from [employer] regarding Prior Authorization. Successful claim was received and Prior Auth was approved from 08/13/22 to 08/12/23.  Adam Reyes, [facility] Patient [place]  [phone] (phone) [phone] (fax) 08/16/2022 9:35 AM

## 2022-09-03 ENCOUNTER — Other Ambulatory Visit (HOSPITAL_COMMUNITY): Payer: Self-pay

## 2022-09-06 ENCOUNTER — Other Ambulatory Visit (HOSPITAL_COMMUNITY): Payer: Self-pay

## 2022-09-09 ENCOUNTER — Encounter (HOSPITAL_COMMUNITY): Payer: Self-pay | Admitting: Hematology

## 2022-09-16 ENCOUNTER — Encounter (HOSPITAL_COMMUNITY): Payer: Self-pay | Admitting: Hematology

## 2022-09-16 ENCOUNTER — Inpatient Hospital Stay: Payer: PPO | Attending: Hematology

## 2022-09-16 DIAGNOSIS — D649 Anemia, unspecified: Secondary | ICD-10-CM | POA: Diagnosis not present

## 2022-09-16 DIAGNOSIS — C9110 Chronic lymphocytic leukemia of B-cell type not having achieved remission: Secondary | ICD-10-CM

## 2022-09-16 DIAGNOSIS — D696 Thrombocytopenia, unspecified: Secondary | ICD-10-CM | POA: Diagnosis not present

## 2022-09-16 DIAGNOSIS — C8300 Small cell B-cell lymphoma, unspecified site: Secondary | ICD-10-CM | POA: Diagnosis not present

## 2022-09-16 DIAGNOSIS — D509 Iron deficiency anemia, unspecified: Secondary | ICD-10-CM

## 2022-09-16 LAB — CBC WITH DIFFERENTIAL/PLATELET
Abs Immature Granulocytes: 0.07 10*3/uL (ref 0.00–0.07)
Basophils Absolute: 0.1 10*3/uL (ref 0.0–0.1)
Basophils Relative: 1 %
Eosinophils Absolute: 0.1 10*3/uL (ref 0.0–0.5)
Eosinophils Relative: 2 %
HCT: 45 % (ref 39.0–52.0)
Hemoglobin: 15.5 g/dL (ref 13.0–17.0)
Immature Granulocytes: 1 %
Lymphocytes Relative: 17 %
Lymphs Abs: 1.5 10*3/uL (ref 0.7–4.0)
MCH: 32 pg (ref 26.0–34.0)
MCHC: 34.4 g/dL (ref 30.0–36.0)
MCV: 92.8 fL (ref 80.0–100.0)
Monocytes Absolute: 0.6 10*3/uL (ref 0.1–1.0)
Monocytes Relative: 7 %
Neutro Abs: 6.3 10*3/uL (ref 1.7–7.7)
Neutrophils Relative %: 72 %
Platelets: 148 10*3/uL — ABNORMAL LOW (ref 150–400)
RBC: 4.85 MIL/uL (ref 4.22–5.81)
RDW: 12.4 % (ref 11.5–15.5)
WBC: 8.6 10*3/uL (ref 4.0–10.5)
nRBC: 0 % (ref 0.0–0.2)

## 2022-09-16 LAB — COMPREHENSIVE METABOLIC PANEL
ALT: 22 U/L (ref 0–44)
AST: 23 U/L (ref 15–41)
Albumin: 3.9 g/dL (ref 3.5–5.0)
Alkaline Phosphatase: 60 U/L (ref 38–126)
Anion gap: 8 (ref 5–15)
BUN: 21 mg/dL (ref 8–23)
CO2: 23 mmol/L (ref 22–32)
Calcium: 8.7 mg/dL — ABNORMAL LOW (ref 8.9–10.3)
Chloride: 105 mmol/L (ref 98–111)
Creatinine, Ser: 1.13 mg/dL (ref 0.61–1.24)
GFR, Estimated: >60 mL/min (ref >60)
Glucose, Bld: 140 mg/dL — ABNORMAL HIGH (ref 70–99)
Potassium: 3.6 mmol/L (ref 3.5–5.1)
Sodium: 136 mmol/L (ref 135–145)
Total Bilirubin: 1.3 mg/dL — ABNORMAL HIGH (ref 0.3–1.2)
Total Protein: 6.2 g/dL — ABNORMAL LOW (ref 6.5–8.1)

## 2022-09-16 LAB — IRON AND TIBC
Iron: 97 ug/dL (ref 45–182)
Saturation Ratios: 28 % (ref 17.9–39.5)
TIBC: 341 ug/dL (ref 250–450)
UIBC: 244 ug/dL

## 2022-09-16 LAB — FERRITIN: Ferritin: 41 ng/mL (ref 24–336)

## 2022-09-16 LAB — LACTATE DEHYDROGENASE: LDH: 142 U/L (ref 98–192)

## 2022-09-23 ENCOUNTER — Inpatient Hospital Stay (HOSPITAL_BASED_OUTPATIENT_CLINIC_OR_DEPARTMENT_OTHER): Payer: PPO | Admitting: Hematology

## 2022-09-23 VITALS — BP 139/69 | HR 72 | Temp 97.7°F | Resp 18 | Ht 76.0 in | Wt 267.1 lb

## 2022-09-23 DIAGNOSIS — C9110 Chronic lymphocytic leukemia of B-cell type not having achieved remission: Secondary | ICD-10-CM

## 2022-09-23 DIAGNOSIS — C8300 Small cell B-cell lymphoma, unspecified site: Secondary | ICD-10-CM | POA: Diagnosis not present

## 2022-09-23 NOTE — Patient Instructions (Addendum)
Adam Reyes  Discharge Instructions  You were seen and examined today by Dr. Delton Coombes.  Dr. Delton Coombes discussed your most recent lab work which revealed that everything looks good.  Dr. Delton Coombes is going to schedule you for CT of your neck, CT chest, abdomen, and pelvis.  Continue taking Imbruvica as prescribed.  Follow-up as scheduled with labs and scans.    Thank you for choosing [facility] to provide your oncology and hematology care.   To afford each patient quality time with our provider, please arrive at least 15 minutes before your scheduled appointment time. You may need to reschedule your appointment if you arrive late (10 or more minutes). Arriving late affects you and other patients whose appointments are after yours.  Also, if you miss three or more appointments without notifying the office, you may be dismissed from the clinic at the provider's discretion.    Again, thank you for choosing [facility].  Our hope is that these requests will decrease the amount of time that you wait before being seen by our physicians.   If you have a lab appointment with the [facility] please come in thru the Main Entrance and check in at the main information desk.           _____________________________________________________________  Should you have questions after your visit to [facility], please contact our office at [phone] and follow the prompts.  Our office hours are 8:00 a.m. to 4:30 p.m. Monday - Thursday and 8:00 a.m. to 2:30 p.m. Friday.  Please note that voicemails left after 4:00 p.m. may not be returned until the following business day.  We are closed weekends and all major holidays.  You do have access to a nurse 24-7, just call the main number to the clinic [phone] and do not press any options, hold on the line and a nurse will answer the phone.    For prescription refill  requests, have your pharmacy contact our office and allow 72 hours.    Masks are optional in the cancer centers. If you would like for your care team to wear a mask while they are taking care of you, please let them know. You may have one support person who is at least 65 years old accompany you for your appointments.

## 2022-09-23 NOTE — Progress Notes (Signed)
[facility] [address] [place] [address]    Clinic Day:  09/23/2022  Referring physician: Lindell Spar, MD  Patient Care Team: Lindell Spar, MD as PCP - General (Internal Medicine) Leonie Man, MD as PCP - Cardiology (Cardiology) Derek Jack, MD as Consulting Physician (Oncology)   ASSESSMENT & PLAN:   Assessment: 1.  Stage IV small lymphocytic lymphoma: -CLL FISH panel normal, Ig HV positive, T p53 negative. -Ibrutinib 420 mg started on 12/01/2017, 6 cycles of rituximab from 12/14/2017 through 05/21/2018. -CT CAP on 06/30/2019 showed interval decrease in size of multiple bilateral iliac and pelvic sidewall and inguinal lymph nodes.  Largest left inguinal lymph node measures 1.7 x 1.3 cm.  Additional unchanged enlarged lower paraesophageal and retroperitoneal lymph nodes present.  Multiple stable likely benign small pulmonary nodules.   2.  Health maintenance: - Colonoscopy on 10/10/2020 showed multiple polyps with no bleeding areas. - EGD on 10/10/2020 showed normal esophagus, few gastric polyps, gastric erosions with no stigmata of recent bleeding. - Push enteroscopy on 11/14/2020 did not reveal any bleeding issues.  Plan: 1.  Stage IV small lymphocytic lymphoma: -He is taking Ibrutinib without any problems. - CT soft tissue neck and CAP in September 2023: Lymphadenopathy in the neck and subcarinal region and retroperitoneum.  Normal spleen. - Physical exam: Right posterior triangle lymph nodes 2 to 3 cm in size.  Right axillary lymph node palpable.  No splenomegaly. - We reviewed his labs from 09/16/2022 which showed normal LFTs.  CBC was grossly normal.  LDH was normal. - He has switched to Medicare advantage plan beginning first of this month.  We will reach out to our specialty pharmacy as he apparently has $3000 of co-pay per year. - I have recommended follow-up in 4 months.  I will obtain imaging of the neck, chest, abdomen and pelvis  prior to next visit along with labs.  We will do imaging once a year to keep track of lymphadenopathy in the chest, abdomen and retroperitoneum.    2.  Mild thrombocytopenia: -On and off mild thrombocytopenia from myelosuppression is stable.  Platelet count today is 148.   3.  Mild hyperbilirubinemia: -This is from [place].  Bilirubin is stable at 1.3.   4.  High risk drug monitoring: -Heart rate is regular with no signs of atrial fibrillation.  No bleeding issues reported.   5.  Normocytic anemia: -Continue iron tablet every other day.  Hemoglobin is stable at 15.5 and ferritin is also stable at 41.  Orders Placed This Encounter  Procedures   CT CHEST ABDOMEN PELVIS W CONTRAST    Standing Status:   Future    Standing Expiration Date:   09/23/2023    Order Specific Question:   If indicated for the ordered procedure, I authorize the administration of contrast media per Radiology protocol    Answer:   Yes    Order Specific Question:   Does the patient have a contrast media/X-ray dye allergy?    Answer:   No    Order Specific Question:   Preferred imaging location?    Answer:   [facility]    Order Specific Question:   Release to patient    Answer:   Immediate    Order Specific Question:   Is Oral Contrast requested for this exam?    Answer:   Yes, Per Radiology protocol   CT SOFT TISSUE NECK W CONTRAST    Standing Status:  Future    Standing Expiration Date:   09/23/2023    Order Specific Question:   If indicated for the ordered procedure, I authorize the administration of contrast media per Radiology protocol    Answer:   Yes    Order Specific Question:   Does the patient have a contrast media/X-ray dye allergy?    Answer:   No    Order Specific Question:   Preferred imaging location?    Answer:   [facility]    Order Specific Question:   Release to patient    Answer:   Immediate [1]   CBC with Differential/Platelet    Standing Status:   Future    Standing  Expiration Date:   09/23/2023    Order Specific Question:   Release to patient    Answer:   Immediate   Comprehensive metabolic panel    Standing Status:   Future    Standing Expiration Date:   09/23/2023    Order Specific Question:   Release to patient    Answer:   Immediate   Lactate dehydrogenase    Standing Status:   Future    Standing Expiration Date:   09/23/2023    Order Specific Question:   Release to patient    Answer:   Immediate   Ferritin    Standing Status:   Future    Standing Expiration Date:   09/23/2023    Order Specific Question:   Release to patient    Answer:   Immediate   Iron and TIBC    Standing Status:   Future    Standing Expiration Date:   09/23/2023    Order Specific Question:   Release to patient    Answer:   Immediate      I,Alexis Herring,acting as a scribe for Derek Jack, MD.,have documented all relevant documentation on the behalf of Derek Jack, MD,as directed by  Derek Jack, MD while in the presence of Derek Jack, MD.   I, Derek Jack MD, have reviewed the above documentation for accuracy and completeness, and I agree with the above.   Derek Jack, MD   3/11/20244:26 PM  CHIEF COMPLAINT:   Diagnosis: small lymphocytic lymphoma and symptomatic anemia    Cancer Staging  No matching staging information was found for the patient.   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 There also are enlarged parotid lymph nodes bilaterally. There is bilateral axillary adenopathy as well as mediastinal adenopathy. Findings are consistent with lymphoma. Biopsy recommended.   06/27/2015 Procedure   Left neck lymph node  biopsy by Dr. Benjamine Mola   06/27/2015 Pathology Results   Diagnosis Lymph node for lymphoma, Left neck node for lymphoma work up - [place].  LOW GRADE.   06/27/2015 Pathology Results   Tissue-Flow Cytometry - MONOCLONAL B CELL POPULATION IDENTIFIED. The phenotypic features are consistent with small lymphocytic lymphoma/chronic lymphocytic leukemia and correlate well with the morphology in the lymph node   Lymphoma, small lymphocytic ([place])  10/14/2017 Initial Diagnosis   Lymphoma, small lymphocytic ([place])   01/01/2018 - 05/21/2018 Chemotherapy   The patient had riTUXimab (RITUXAN) 900 mg in sodium chloride 0.9 % 250 mL (2.6471 mg/mL) infusion, 375 mg/m2 = 900  mg, Intravenous,  Once, 6 of 6 cycles Dose modification: 500 mg/m2 (original dose 500 mg/m2, Cycle 2, Reason: Provider Judgment) Administration: 900 mg (01/01/2018), 1,300 mg (01/29/2018), 1,300 mg (02/26/2018), 1,300 mg (03/26/2018), 1,300 mg (04/23/2018), 1,300 mg (05/21/2018)  for chemotherapy treatment.       INTERVAL HISTORY:   Adam Reyes is a 67 y.o. male presenting to clinic today for follow up of small lymphocytic lymphoma and symptomatic anemia. He was last seen by me on 04/18/22.  Today, he states that he is doing well overall. His appetite level is at 100%. His energy level is at 85%. He denies any infections in the last 5 months.  Denies any fevers, night sweats or weight loss.  He has completely retired since the beginning of the year.   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  No PCI.  Preserved EF    Syncope due to orthostatic hypotension 04/21/2018    Surgical History: Past Surgical History:  Procedure Laterality Date   BIOPSY  10/10/2020   Procedure: BIOPSY;  Surgeon: Harvel Quale, MD;  [facility];  Service: Gastroenterology;;   BIOPSY  11/14/2020   Procedure: BIOPSY;  Surgeon: Harvel Quale, MD;  [facility];  Service: Gastroenterology;;   COLONOSCOPY N/A 10/11/2015   Procedure: COLONOSCOPY;  Surgeon: Rogene Houston, MD;  [facility];  Service: Endoscopy;  Laterality: N/A;  10/11/2015   COLONOSCOPY WITH PROPOFOL N/A 10/10/2020   Procedure: COLONOSCOPY WITH PROPOFOL;  Surgeon: Harvel Quale, MD;  [facility];  Service: Gastroenterology;  Laterality: N/A;  AM   ENTEROSCOPY N/A 11/14/2020   Procedure: push enteroscopy;  Surgeon: Harvel Quale, MD;  [facility];  Service: Gastroenterology;  Laterality: N/A;   ESOPHAGOGASTRODUODENOSCOPY (EGD) WITH PROPOFOL N/A 10/10/2020   Procedure: ESOPHAGOGASTRODUODENOSCOPY (EGD) WITH PROPOFOL;  Surgeon: Harvel Quale, MD;  [facility];  Service: Gastroenterology;  Laterality: N/A;   ESOPHAGOGASTRODUODENOSCOPY (EGD) WITH PROPOFOL N/A 11/14/2020   Procedure: ESOPHAGOGASTRODUODENOSCOPY (EGD) WITH PROPOFOL;  Surgeon: Harvel Quale, MD;  [facility];  Service: Gastroenterology;  Laterality: N/A;  12:00   GIVENS CAPSULE STUDY N/A 11/07/2020   Procedure: GIVENS CAPSULE STUDY;  Surgeon: Harvel Quale, MD;  [facility];  Service: Gastroenterology;  Laterality: N/A;  7:30 am   Graded Exercise Tolerance Test (GXT/ETT)  05/2017   10.7 METs (9: 25 min.  Reached 103% max predicted heart rate).  No EKG findings to suggest coronary ischemia.  Negative, low risk GXT   HEMOSTASIS CLIP PLACEMENT  11/14/2020   Procedure: HEMOSTASIS CLIP PLACEMENT;  Surgeon: Harvel Quale, MD;  [facility];  Service: Gastroenterology;;  small bowel nodule   LEFT HEART CATH AND CORONARY ANGIOGRAPHY N/A 03/05/2017   Procedure: LEFT HEART CATH AND CORONARY ANGIOGRAPHY;  Surgeon: Leonie Man, MD;  Location: [facility];  Service: Cardiovascular: pLAD-Diag1 50-55% (non-flow-limiiting).  EF ~50-55%.  although bifurcation lesion was presumed Culprit - no PCI (not flow limiting). - Med Rx.   MASS  BIOPSY Left 06/27/2015   Procedure: OPEN LEFT NECK BIOPSY ;  Surgeon: Leta Baptist, MD;  Location: [facility];  Service: ENT;  Laterality: Left;   POLYPECTOMY  10/10/2020   Procedure: POLYPECTOMY;  Surgeon: Harvel Quale, MD;  [facility];  Service: Gastroenterology;;   REFRACTIVE SURGERY Bilateral     Social History: Social History   Socioeconomic History   Marital status: Married    Spouse name: Not on file   Number of children: Not on file   Years of education: Not on file   Highest education level: Not on file  Occupational History   Not on file  Tobacco Use   Smoking status: Former    Years: 2.00    Types: Cigarettes   Smokeless tobacco: Former    Types: Chew, Snuff   Tobacco comments:    03/06/2017 "quit smoking when I was young; quit chew/snuff in ~ 2008"  Vaping Use   Vaping Use: Never used  Substance and Sexual Activity   Alcohol use: Yes    Alcohol/week: 2.0 standard drinks of alcohol    Types: 2 Cans of beer per week   Drug use: No   Sexual activity: Yes  Other Topics Concern   Not on file  Social History Narrative   Married since 2001,third.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   Social Determinants of Health   Financial Resource Strain: Low Risk  (05/16/2020)   Overall Financial Resource Strain (CARDIA)    Difficulty of Paying Living Expenses: Not hard at all  Food Insecurity: No Food Insecurity (05/16/2020)   Hunger Vital Sign    Worried About Running Out of Food in the Last Year: Never true    Ran Out of Food in the Last  Year: Never true  Transportation Needs: No Transportation Needs (05/16/2020)   PRAPARE - Hydrologist (Medical): No    Lack of Transportation (Non-Medical): No  Physical Activity: Sufficiently Active (05/16/2020)   Exercise Vital Sign    Days of Exercise per Week: 5 days    Minutes of Exercise per Session: 30 min  Stress: No Stress Concern Present (05/16/2020)   [place]    Feeling of Stress : Not at all  Social Connections: Moderately Integrated (05/16/2020)   Social Connection and Isolation Panel [NHANES]    Frequency of Communication with Friends and Family: More than three times a week    Frequency of Social Gatherings with Friends and Family: More than three times a week    Attends Religious Services: More than 4 times per year    Active Member of [employer] or Organizations: No    Attends Archivist Meetings: Never    Marital Status: Married  Human resources officer Violence: Not At Risk (05/16/2020)   Humiliation, Afraid, Rape, and Kick questionnaire    Fear of Current or Ex-Partner: No    Emotionally Abused: No    Physically Abused: No    Sexually Abused: No    Family History: Family History  Problem Relation Age of Onset   Diabetes Mother    Cancer Father    Cancer Brother     Current Medications:  Current Outpatient Medications:    aspirin 81 MG chewable tablet, Chew 1 tablet (81 mg total) by mouth every other day., Disp: 30 tablet, Rfl: 11   atorvastatin (LIPITOR) 40 MG tablet, TAKE 1 TABLET BY MOUTH EVERY DAY, Disp: 90 tablet, Rfl: 0  cholecalciferol (VITAMIN D3) 25 MCG (1000 UNIT) tablet, Take 10,000 Units by mouth daily., Disp: , Rfl:    ferrous sulfate 324 (65 Fe) MG TBEC, Take 324 mg by mouth every other day., Disp: , Rfl:    ibrutinib (IMBRUVICA) 420 MG tablet, Take 1 tablet (420 mg total) by mouth daily., Disp: 30 tablet, Rfl: 11   metoprolol tartrate (LOPRESSOR) 25  MG tablet, TAKE 1/2 TABLET BY MOUTH TWICE DAILY, Disp: 90 tablet, Rfl: 0   nitroGLYCERIN (NITROSTAT) 0.4 MG SL tablet, PLACE 1 TABLET UNDER THE TONGUE EVERY 5 MINUTES AS NEEDED FOR CHEST PAIN, Disp: 25 tablet, Rfl: 2   telmisartan (MICARDIS) 20 MG tablet, TAKE 1 TABLET(20 MG) BY MOUTH DAILY, Disp: 30 tablet, Rfl: 3   Allergies: No Known Allergies  REVIEW OF SYSTEMS:   Review of Systems  Constitutional:  Negative for chills, fatigue and fever.  HENT:   Negative for lump/mass, mouth sores, nosebleeds, sore throat and trouble swallowing.   Eyes:  Negative for eye problems.  Respiratory:  Negative for cough and shortness of breath.   Cardiovascular:  Negative for chest pain, leg swelling and palpitations.  Gastrointestinal:  Negative for abdominal pain, constipation, diarrhea, nausea and vomiting.  Genitourinary:  Negative for bladder incontinence, difficulty urinating, dysuria, frequency, hematuria and nocturia.   Musculoskeletal:  Negative for arthralgias, back pain, flank pain, myalgias and neck pain.  Skin:  Negative for itching and rash.  Neurological:  Negative for dizziness, headaches and numbness.  Hematological:  Does not bruise/bleed easily.  Psychiatric/Behavioral:  Negative for depression, sleep disturbance and suicidal ideas. The patient is not nervous/anxious.   All other systems reviewed and are negative.    VITALS:   Blood pressure 139/69, pulse 72, temperature 97.7 F (36.5 C), temperature source Oral, resp. rate 18, height '6\' 4"'$  (1.93 m), weight 267 lb 1.6 oz (121.2 kg), SpO2 95 %.  Wt Readings from Last 3 Encounters:  09/23/22 267 lb 1.6 oz (121.2 kg)  06/25/22 270 lb 9.6 oz (122.7 kg)  05/14/22 273 lb 3.2 oz (123.9 kg)    Body mass index is 32.51 kg/m.  Performance status (ECOG): 1 - Symptomatic but completely ambulatory  PHYSICAL EXAM:   Physical Exam Vitals and nursing note reviewed. Exam conducted with a chaperone present.  Constitutional:       Appearance: Normal appearance.  Cardiovascular:     Rate and Rhythm: Normal rate and regular rhythm.     Pulses: Normal pulses.     Heart sounds: Normal heart sounds.  Pulmonary:     Effort: Pulmonary effort is normal.     Breath sounds: Normal breath sounds.  Abdominal:     Palpations: Abdomen is soft. There is no hepatomegaly, splenomegaly or mass.     Tenderness: There is no abdominal tenderness.  Musculoskeletal:     Right lower leg: No edema.     Left lower leg: No edema.  Lymphadenopathy:     Cervical: Cervical adenopathy present.     Right cervical: No superficial, deep or posterior cervical adenopathy.    Left cervical: No superficial, deep or posterior cervical adenopathy.     Upper Body:     Right upper body: Axillary adenopathy present. No supraclavicular adenopathy.     Left upper body: No supraclavicular or axillary adenopathy.  Neurological:     General: No focal deficit present.     Mental Status: He is alert and oriented to person, place, and time.  Psychiatric:  Mood and Affect: Mood normal.        Behavior: Behavior normal.     LABS:      Latest Ref Rng & Units 09/16/2022    3:10 PM 04/11/2022    2:23 PM 03/07/2022   10:53 AM  CBC  WBC 4.0 - 10.5 K/uL 8.6  6.4  5.9   Hemoglobin 13.0 - 17.0 g/dL 15.5  16.3  16.0   Hematocrit 39.0 - 52.0 % 45.0  47.1  46.0   Platelets 150 - 400 K/uL 148  146  142       Latest Ref Rng & Units 09/16/2022    3:10 PM 05/29/2022    9:04 AM 04/11/2022    2:23 PM  CMP  Glucose 70 - 99 mg/dL 140  127  88   BUN 8 - 23 mg/dL '21  19  19   '$ Creatinine 0.61 - 1.24 mg/dL 1.13  1.09  1.03   Sodium 135 - 145 mmol/L 136  141  139   Potassium 3.5 - 5.1 mmol/L 3.6  3.9  3.8   Chloride 98 - 111 mmol/L 105  102  104   CO2 22 - 32 mmol/L '23  23  23   '$ Calcium 8.9 - 10.3 mg/dL 8.7  9.2  9.0   Total Protein 6.5 - 8.1 g/dL 6.2  6.4  6.7   Total Bilirubin 0.3 - 1.2 mg/dL 1.3  1.3  1.3   Alkaline Phos 38 - 126 U/L 60  61  57   AST 15 -  41 U/L '23  30  23   '$ ALT 0 - 44 U/L 22  38  26      No results found for: "CEA1", "CEA" / No results found for: "CEA1", "CEA" No results found for: "PSA1" No results found for: "CAN199" No results found for: "CAN125"  Lab Results  Component Value Date   TOTALPROTELP 6.5 07/11/2015   TOTALPROTELP 6.4 07/11/2015   ALBUMINELP 3.7 07/11/2015   A1GS 0.3 07/11/2015   A2GS 0.5 07/11/2015   BETS 1.0 07/11/2015   GAMS 1.0 07/11/2015   MSPIKE 0.4 (H) 07/11/2015   SPEI Comment 07/11/2015   Lab Results  Component Value Date   TIBC 341 09/16/2022   TIBC 375 04/11/2022   TIBC 353 01/03/2022   FERRITIN 41 09/16/2022   FERRITIN 41 04/11/2022   FERRITIN 33 01/03/2022   IRONPCTSAT 28 09/16/2022   IRONPCTSAT 34 04/11/2022   IRONPCTSAT 27 01/03/2022   Lab Results  Component Value Date   LDH 142 09/16/2022   LDH 168 04/11/2022   LDH 160 03/07/2022     STUDIES:   No results found.

## 2022-09-24 ENCOUNTER — Encounter (HOSPITAL_COMMUNITY): Payer: Self-pay | Admitting: Hematology

## 2022-09-24 ENCOUNTER — Ambulatory Visit (INDEPENDENT_AMBULATORY_CARE_PROVIDER_SITE_OTHER): Payer: PPO | Admitting: Internal Medicine

## 2022-09-24 ENCOUNTER — Telehealth: Payer: Self-pay

## 2022-09-24 ENCOUNTER — Encounter: Payer: Self-pay | Admitting: Internal Medicine

## 2022-09-24 ENCOUNTER — Other Ambulatory Visit (HOSPITAL_COMMUNITY): Payer: Self-pay

## 2022-09-24 VITALS — BP 128/80 | HR 69 | Ht 76.0 in | Wt 267.2 lb

## 2022-09-24 DIAGNOSIS — I493 Ventricular premature depolarization: Secondary | ICD-10-CM | POA: Diagnosis not present

## 2022-09-24 DIAGNOSIS — Z23 Encounter for immunization: Secondary | ICD-10-CM | POA: Diagnosis not present

## 2022-09-24 DIAGNOSIS — I700 Atherosclerosis of aorta: Secondary | ICD-10-CM

## 2022-09-24 DIAGNOSIS — C8300 Small cell B-cell lymphoma, unspecified site: Secondary | ICD-10-CM

## 2022-09-24 DIAGNOSIS — I25119 Atherosclerotic heart disease of native coronary artery with unspecified angina pectoris: Secondary | ICD-10-CM | POA: Diagnosis not present

## 2022-09-24 DIAGNOSIS — I10 Essential (primary) hypertension: Secondary | ICD-10-CM

## 2022-09-24 MED ORDER — TELMISARTAN 20 MG PO TABS: ORAL_TABLET | ORAL | 1 refills | Status: DC

## 2022-09-24 MED ORDER — METOPROLOL TARTRATE 25 MG PO TABS: ORAL_TABLET | ORAL | 1 refills | Status: DC

## 2022-09-24 NOTE — Assessment & Plan Note (Signed)
BP Readings from Last 1 Encounters:  09/24/22 128/80   Well-controlled with Metoprolol 12.5 mg BID and Telmisartan 20 mg QD at home Has palpitations almost daily, increased Metoprolol dose to 25 mg QAM and 12.5 mg QPM Counseled for compliance with the medications Advised DASH diet and moderate exercise/walking, at least 150 mins/week

## 2022-09-24 NOTE — Telephone Encounter (Signed)
Oral Oncology Patient Advocate Encounter  Prior Authorization for Kate Sable has been approved.    PA# [id number]  Effective dates: 09/24/22 through 09/24/23  Patients co-pay is $3,328.35.  Patient has grant on file to bring co-pay to $0.     Berdine Addison, [place] Patient [place]  [phone] (phone) [phone] (fax) 09/24/2022 12:25 PM

## 2022-09-24 NOTE — Telephone Encounter (Signed)
Oral Oncology Patient Advocate Encounter   Patient has had an insurance change and now has a co-pay for [facility].   Was successful in securing patient an $3,250 grant from Patient [employer] [facility]) to provide copayment coverage for Imbruvica.  This will keep the out of pocket expense at $0.     I have spoken with the patient.    The billing information is as follows and has been shared with [facility].   Member ID: [id number] Group ID: [id number] RxBin: [id number] Dates of Eligibility: 06/25/22 through 09/22/23  Fund:  Chronic Lymphocytic [place], [place] Patient [place]  [phone] (phone) [phone] (fax) 09/24/2022 8:28 AM

## 2022-09-24 NOTE — Telephone Encounter (Addendum)
Oral Oncology Patient Advocate Encounter  Change in insurance   Received notification that prior authorization for Adam Reyes is required.   PA submitted verbally on 09/24/22. CoverMyMeds would not connect with [employer] to complete PA electronically. Called [employer] at [phone] and submitted verbal Prior Authorization.  Submitted chart notes to fax number [phone] per representative's request.    Case # [id number]  Status is pending     Berdine Addison, [place] [facility] [place]  [phone] (phone) [phone] (fax) 09/24/2022 8:11 AM

## 2022-09-24 NOTE — Assessment & Plan Note (Signed)
Has had cardiac cath, nuclear stress test - benign On Aspirin, statin and Metoprolol Followed by Cardiology

## 2022-09-24 NOTE — Assessment & Plan Note (Signed)
Has palpitations, h/o frequent PVCs Increased dose of Metoprolol to 25 mg QAM and continue 12.5 mg QPM Follow up with Cardiology

## 2022-09-24 NOTE — Assessment & Plan Note (Signed)
On Imbruvica ?Followed by Oncology ?

## 2022-09-24 NOTE — Patient Instructions (Signed)
Please start taking Metoprolol 25 mg in the morning and 12.5 mg in the evening.  Please continue taking Telmisartan as prescribed.  Please continue to follow low salt diet and perform moderate exercise/walking at least 150 mins/week.

## 2022-09-24 NOTE — Progress Notes (Signed)
Established Patient Office Visit  Subjective:  Patient ID: Adam Reyes, male    DOB: [birth year]  Age: 67 y.o. MRN: [id number]  CC:  Chief Complaint  Patient presents with   Hypertension    Follow up on hypertension     HPI Adam Reyes is a 67 y.o. male with past medical history of CAD, HTN, HLD and lymphocytic lymphoma who presents for f/u of his chronic medical conditions.  HTN and CAD: His blood pressure was elevated today, but it has been consistently within normal limits at home.  He has brought his home BP readings, which show around 110s-120s/70s.  He denies any headache, dizziness, chest pain, dyspnea, but does have almost daily palpitations.  He has been taking telmisartan and metoprolol regularly.  HLD: He takes Lipitor for HLD.     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  No PCI.  Preserved EF   Syncope due to orthostatic hypotension 04/21/2018    Past Surgical History:  Procedure Laterality Date   BIOPSY  10/10/2020   Procedure: BIOPSY;  Surgeon: Harvel Quale, MD;  [facility];  Service: Gastroenterology;;   BIOPSY  11/14/2020   Procedure: BIOPSY;  Surgeon: Harvel Quale, MD;  [facility];  Service: Gastroenterology;;   COLONOSCOPY N/A 10/11/2015   Procedure: COLONOSCOPY;  Surgeon: Rogene Houston, MD;  [facility];  Service: Endoscopy;  Laterality: N/A;  10/11/2015   COLONOSCOPY WITH PROPOFOL N/A 10/10/2020   Procedure: COLONOSCOPY WITH PROPOFOL;  Surgeon: Harvel Quale, MD;  [facility];  Service: Gastroenterology;   Laterality: N/A;  AM   ENTEROSCOPY N/A 11/14/2020   Procedure: push enteroscopy;  Surgeon: Harvel Quale, MD;  [facility];  Service: Gastroenterology;  Laterality: N/A;   ESOPHAGOGASTRODUODENOSCOPY (EGD) WITH PROPOFOL N/A 10/10/2020   Procedure: ESOPHAGOGASTRODUODENOSCOPY (EGD) WITH PROPOFOL;  Surgeon: Harvel Quale, MD;  [facility];  Service: Gastroenterology;  Laterality: N/A;   ESOPHAGOGASTRODUODENOSCOPY (EGD) WITH PROPOFOL N/A 11/14/2020   Procedure: ESOPHAGOGASTRODUODENOSCOPY (EGD) WITH PROPOFOL;  Surgeon: Harvel Quale, MD;  [facility];  Service: Gastroenterology;  Laterality: N/A;  12:00   GIVENS CAPSULE STUDY N/A 11/07/2020   Procedure: GIVENS CAPSULE STUDY;  Surgeon: Harvel Quale, MD;  [facility];  Service: Gastroenterology;  Laterality: N/A;  7:30 am   Graded Exercise Tolerance Test (GXT/ETT)  05/2017   10.7 METs (9: 25 min.  Reached 103% max predicted heart rate).  No EKG findings to suggest coronary ischemia.  Negative, low risk GXT   HEMOSTASIS CLIP PLACEMENT  11/14/2020   Procedure: HEMOSTASIS CLIP PLACEMENT;  Surgeon: Harvel Quale, MD;  [facility];  Service: Gastroenterology;;  small bowel nodule   LEFT HEART CATH AND CORONARY ANGIOGRAPHY N/A 03/05/2017   Procedure: LEFT HEART CATH AND CORONARY ANGIOGRAPHY;  Surgeon: Leonie Man, MD;  Location: [facility];  Service: Cardiovascular: pLAD-Diag1 50-55% (non-flow-limiiting).  EF ~50-55%.  although bifurcation lesion was presumed Culprit - no PCI (not flow limiting). - Med Rx.   MASS BIOPSY Left  06/27/2015   Procedure: OPEN LEFT NECK BIOPSY ;  Surgeon: Leta Baptist, MD;  Location: [facility];  Service: ENT;  Laterality: Left;   POLYPECTOMY  10/10/2020   Procedure: POLYPECTOMY;  Surgeon: Harvel Quale, MD;  [facility];  Service: Gastroenterology;;   REFRACTIVE SURGERY  Bilateral     Family History  Problem Relation Age of Onset   Diabetes Mother    Cancer Father    Cancer Brother     Social History   Socioeconomic History   Marital status: Married    Spouse name: Not on file   Number of children: Not on file   Years of education: Not on file   Highest education level: Not on file  Occupational History   Not on file  Tobacco Use   Smoking status: Former    Years: 2.00    Types: Cigarettes   Smokeless tobacco: Former    Types: Chew, Snuff   Tobacco comments:    03/06/2017 "quit smoking when I was young; quit chew/snuff in ~ 2008"  Vaping Use   Vaping Use: Never used  Substance and Sexual Activity   Alcohol use: Yes    Alcohol/week: 2.0 standard drinks of alcohol    Types: 2 Cans of beer per week   Drug use: No   Sexual activity: Yes  Other Topics Concern   Not on file  Social History Narrative   Married since 2001,third.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   Social Determinants of Health   Financial Resource Strain: Low Risk  (05/16/2020)   Overall Financial Resource Strain (CARDIA)    Difficulty of Paying Living Expenses: Not hard at all  Food Insecurity: No Food Insecurity (05/16/2020)   Hunger Vital Sign    Worried About Running Out of Food in the Last Year: Never true    Ran Out of Food in the Last Year: Never true  Transportation Needs: No Transportation Needs (05/16/2020)   PRAPARE - Hydrologist (Medical): No    Lack of Transportation (Non-Medical): No  Physical Activity: Sufficiently Active (05/16/2020)   Exercise Vital Sign    Days of Exercise per Week: 5 days    Minutes of Exercise per Session: 30 min  Stress: No Stress Concern Present (05/16/2020)   [place]    Feeling of Stress : Not at all  Social Connections: Moderately Integrated (05/16/2020)   Social Connection and Isolation Panel [NHANES]    Frequency of  Communication with Friends and Family: More than three times a week    Frequency of Social Gatherings with Friends and Family: More than three times a week    Attends Religious Services: More than 4 times per year    Active Member of [employer] or Organizations: No    Attends Archivist Meetings: Never    Marital Status: Married  Human resources officer Violence: Not At Risk (05/16/2020)   Humiliation, Afraid, Rape, and Kick questionnaire    Fear of Current or Ex-Partner: No    Emotionally Abused: No    Physically Abused: No    Sexually Abused: No    Outpatient Medications Prior to Visit  Medication Sig Dispense Refill   aspirin 81 MG chewable tablet Chew 1 tablet (81 mg total) by mouth every other day. 30 tablet 11   atorvastatin (LIPITOR) 40 MG tablet TAKE 1 TABLET BY MOUTH EVERY DAY 90 tablet 0   cholecalciferol (VITAMIN D3) 25  MCG (1000 UNIT) tablet Take 10,000 Units by mouth daily.     ferrous sulfate 324 (65 Fe) MG TBEC Take 324 mg by mouth every other day.     ibrutinib (IMBRUVICA) 420 MG tablet Take 1 tablet (420 mg total) by mouth daily. 30 tablet 11   nitroGLYCERIN (NITROSTAT) 0.4 MG SL tablet PLACE 1 TABLET UNDER THE TONGUE EVERY 5 MINUTES AS NEEDED FOR CHEST PAIN 25 tablet 2   metoprolol tartrate (LOPRESSOR) 25 MG tablet TAKE 1/2 TABLET BY MOUTH TWICE DAILY 90 tablet 0   telmisartan (MICARDIS) 20 MG tablet TAKE 1 TABLET(20 MG) BY MOUTH DAILY 30 tablet 3   No facility-administered medications prior to visit.    No Known Allergies  ROS Review of Systems  Constitutional:  Negative for chills and fever.  HENT:  Negative for congestion and sore throat.   Eyes:  Negative for pain and discharge.  Respiratory:  Negative for cough and shortness of breath.   Cardiovascular:  Positive for palpitations. Negative for chest pain.  Gastrointestinal:  Negative for constipation, diarrhea, nausea and vomiting.  Endocrine: Negative for polydipsia and polyuria.  Genitourinary:  Negative  for dysuria and hematuria.  Musculoskeletal:  Negative for neck pain and neck stiffness.  Skin:  Negative for rash.  Neurological:  Negative for dizziness, weakness, numbness and headaches.  Psychiatric/Behavioral:  Negative for agitation and behavioral problems.       Objective:    Physical Exam Vitals reviewed.  Constitutional:      General: He is not in acute distress.    Appearance: He is not diaphoretic.  HENT:     Head: Normocephalic and atraumatic.     Nose: Nose normal.     Mouth/Throat:     Mouth: Mucous membranes are moist.  Eyes:     General: No scleral icterus.    Extraocular Movements: Extraocular movements intact.  Cardiovascular:     Rate and Rhythm: Normal rate and regular rhythm.     Pulses: Normal pulses.     Heart sounds: Normal heart sounds. No murmur heard. Pulmonary:     Breath sounds: Normal breath sounds. No wheezing or rales.  Abdominal:     Palpations: Abdomen is soft.     Tenderness: There is no abdominal tenderness.  Musculoskeletal:     Cervical back: Neck supple. No tenderness.     Right lower leg: No edema.     Left lower leg: No edema.  Lymphadenopathy:     Cervical: Cervical adenopathy present.  Skin:    General: Skin is warm.     Findings: No rash.  Neurological:     General: No focal deficit present.     Mental Status: He is alert and oriented to person, place, and time.  Psychiatric:        Mood and Affect: Mood normal.        Behavior: Behavior normal.     BP 128/80 (BP Location: Left Arm, Cuff Size: Normal)   Pulse 69   Ht '6\' 4"'$  (1.93 m)   Wt 267 lb 3.2 oz (121.2 kg)   SpO2 92%   BMI 32.52 kg/m  Wt Readings from Last 3 Encounters:  09/24/22 267 lb 3.2 oz (121.2 kg)  09/23/22 267 lb 1.6 oz (121.2 kg)  06/25/22 270 lb 9.6 oz (122.7 kg)    Lab Results  Component Value Date   TSH 2.630 05/29/2022   Lab Results  Component Value Date   WBC 8.6 09/16/2022   HGB 15.5  09/16/2022   HCT 45.0 09/16/2022   MCV 92.8  09/16/2022   PLT 148 (L) 09/16/2022   Lab Results  Component Value Date   NA 136 09/16/2022   K 3.6 09/16/2022   CO2 23 09/16/2022   GLUCOSE 140 (H) 09/16/2022   BUN 21 09/16/2022   CREATININE 1.13 09/16/2022   BILITOT 1.3 (H) 09/16/2022   ALKPHOS 60 09/16/2022   AST 23 09/16/2022   ALT 22 09/16/2022   PROT 6.2 (L) 09/16/2022   ALBUMIN 3.9 09/16/2022   CALCIUM 8.7 (L) 09/16/2022   ANIONGAP 8 09/16/2022   EGFR 76 05/29/2022   Lab Results  Component Value Date   CHOL 118 05/29/2022   Lab Results  Component Value Date   HDL 30 (L) 05/29/2022   Lab Results  Component Value Date   LDLCALC 65 05/29/2022   Lab Results  Component Value Date   TRIG 130 05/29/2022   Lab Results  Component Value Date   CHOLHDL 3.9 05/29/2022   Lab Results  Component Value Date   HGBA1C 6.3 (H) 05/29/2022      Assessment & Plan:   Problem List Items Addressed This Visit       Cardiovascular and Mediastinum   Coronary artery disease involving native coronary artery of native heart with angina pectoris (HCC) (Chronic)    Has had cardiac cath, nuclear stress test - benign On Aspirin, statin and Metoprolol Followed by Cardiology      Relevant Medications   metoprolol tartrate (LOPRESSOR) 25 MG tablet   telmisartan (MICARDIS) 20 MG tablet   Frequent PVCs    Has palpitations, h/o frequent PVCs Increased dose of Metoprolol to 25 mg QAM and continue 12.5 mg QPM Follow up with Cardiology      Relevant Medications   metoprolol tartrate (LOPRESSOR) 25 MG tablet   telmisartan (MICARDIS) 20 MG tablet   Essential hypertension - Primary    BP Readings from Last 1 Encounters:  09/24/22 128/80  Well-controlled with Metoprolol 12.5 mg BID and Telmisartan 20 mg QD at home Has palpitations almost daily, increased Metoprolol dose to 25 mg QAM and 12.5 mg QPM Counseled for compliance with the medications Advised DASH diet and moderate exercise/walking, at least 150 mins/week        Relevant Medications   metoprolol tartrate (LOPRESSOR) 25 MG tablet   telmisartan (MICARDIS) 20 MG tablet   Aortic atherosclerosis (HCC)    Noted on imaging On Aspirin and statin      Relevant Medications   metoprolol tartrate (LOPRESSOR) 25 MG tablet   telmisartan (MICARDIS) 20 MG tablet     Other   Lymphoma, small lymphocytic (HCC)    On Imbruvica Followed by Oncology      Meds ordered this encounter  Medications   metoprolol tartrate (LOPRESSOR) 25 MG tablet    Sig: Take 1 tablet (25 mg total) by mouth in the morning AND 0.5 tablets (12.5 mg total) every evening.    Dispense:  135 tablet    Refill:  1   telmisartan (MICARDIS) 20 MG tablet    Sig: TAKE 1 TABLET(20 MG) BY MOUTH DAILY    Dispense:  90 tablet    Refill:  1    Follow-up: Return in about 4 months (around 01/24/2023) for HTN and CAD.    Lindell Spar, MD

## 2022-09-24 NOTE — Assessment & Plan Note (Signed)
Noted on imaging °On Aspirin and statin °

## 2022-10-03 ENCOUNTER — Other Ambulatory Visit: Payer: Self-pay

## 2022-10-04 ENCOUNTER — Other Ambulatory Visit (HOSPITAL_COMMUNITY): Payer: Self-pay

## 2022-10-04 ENCOUNTER — Other Ambulatory Visit: Payer: Self-pay | Admitting: Cardiology

## 2022-10-04 DIAGNOSIS — I25119 Atherosclerotic heart disease of native coronary artery with unspecified angina pectoris: Secondary | ICD-10-CM

## 2022-10-04 DIAGNOSIS — I10 Essential (primary) hypertension: Secondary | ICD-10-CM

## 2022-10-07 ENCOUNTER — Other Ambulatory Visit: Payer: Self-pay

## 2022-10-08 ENCOUNTER — Other Ambulatory Visit (HOSPITAL_COMMUNITY): Payer: Self-pay

## 2022-10-28 ENCOUNTER — Other Ambulatory Visit: Payer: Self-pay

## 2022-11-01 ENCOUNTER — Other Ambulatory Visit: Payer: Self-pay

## 2022-11-13 ENCOUNTER — Telehealth: Payer: Self-pay | Admitting: Cardiology

## 2022-11-13 MED ORDER — ATORVASTATIN CALCIUM 40 MG PO TABS: ORAL_TABLET | ORAL | 0 refills | Status: DC

## 2022-11-13 NOTE — Telephone Encounter (Signed)
*  STAT* If patient is at the pharmacy, call can be transferred to refill team.   1. Which medications need to be refilled? (please list name of each medication and dose if known)  atorvastatin (LIPITOR) 40 MG tablet   2. Which pharmacy/location (including street and city if local pharmacy) is medication to be sent to? [facility] - [place], [place] - [address]   3. Do they need a 30 day or 90 day supply? 90 day

## 2022-11-13 NOTE — Telephone Encounter (Signed)
Refills has been sent to the pharmacy. 

## 2022-11-19 DIAGNOSIS — L7211 Pilar cyst: Secondary | ICD-10-CM | POA: Diagnosis not present

## 2022-11-19 DIAGNOSIS — D485 Neoplasm of uncertain behavior of skin: Secondary | ICD-10-CM | POA: Diagnosis not present

## 2022-11-28 ENCOUNTER — Other Ambulatory Visit (HOSPITAL_COMMUNITY): Payer: Self-pay

## 2022-11-28 DIAGNOSIS — L7212 Trichodermal cyst: Secondary | ICD-10-CM | POA: Diagnosis not present

## 2022-11-28 DIAGNOSIS — D485 Neoplasm of uncertain behavior of skin: Secondary | ICD-10-CM | POA: Diagnosis not present

## 2022-12-03 ENCOUNTER — Other Ambulatory Visit (HOSPITAL_COMMUNITY): Payer: Self-pay

## 2022-12-03 ENCOUNTER — Other Ambulatory Visit: Payer: Self-pay | Admitting: Internal Medicine

## 2022-12-03 DIAGNOSIS — I10 Essential (primary) hypertension: Secondary | ICD-10-CM

## 2022-12-12 DIAGNOSIS — L7212 Trichodermal cyst: Secondary | ICD-10-CM | POA: Diagnosis not present

## 2022-12-12 DIAGNOSIS — D485 Neoplasm of uncertain behavior of skin: Secondary | ICD-10-CM | POA: Diagnosis not present

## 2022-12-24 NOTE — Progress Notes (Unsigned)
Cardiology Clinic Note   Date: 12/26/2022 ID: UGONNA LAMOS, DOB [birth year], MRN [id number]  Primary Cardiologist:  Bryan Lemma, MD  Patient Profile    Adam Reyes is a 67 y.o. male who presents to the clinic today for routine follow up.      Past medical history significant for: Nonobstructive CAD. LHC 03/05/2017 (STEMI): Proximal LAD 50%.  Ostial D1 55%.  Recommendations to treat medically. Exercise stress test 05/28/2017: Normal BP response to exercise.  No ST or T wave changes to suggest ischemia.  Negative GXT. Echo 07/24/2021: EF 60 to 65%.  Grade I DD.  Normal RV function.  Mild LAE.  Mild calcification/thickening of the aortic valve without stenosis.  Mild to moderate AI.  Mild dilatation of aortic root 41 mm.  Mild dilatation of ascending aorta 40 mm. Frequent PVCs. 7-day ZIO 10/26/2020: 2 short bursts of VT.  13 short bursts of PSVT/PAT.  4.6% PVCs of moderate concern.  Symptoms noted with PVCs and SVT. Hypertension. Hyperlipidemia. Lipid panel 05/29/2022: LDL 65, HDL 30, TG 130, total 118. Lymphoma. Anemia. History of GI bleed.     History of Present Illness    Adam Reyes was first evaluated by Dr. Herbie Baltimore on 03/05/2017 during hospitalization for STEMI.  He underwent LHC which showed disease in proximal LAD and ostial D1 with recommendation to treat medically.  Patient continues to be followed by Dr. Herbie Baltimore for the above outlined history.  Patient was last seen in the office by Joni Reining, NP on 06/15/2021 for follow-up.  Patient had been evaluated by Dr. Herbie Baltimore earlier in the year and stress test echo were ordered secondary to complaints of shortness of breath and chest pain.  Before testing could be completed patient was found to be significantly anemic with a hemoglobin of 7.7.  He did not report any further symptoms at the time of his visit.  Echo was reordered as it was not completed previously (details above).  Today, patient is doing well. Patient denies  shortness of breath or dyspnea on exertion. No chest pain, pressure, or tightness. Denies lower extremity edema, orthopnea, or PND. He reports rare, brief palpitations described as skipping that he usually notices at night when he is sitting. They do not bother him. He retired at the beginning of year. He is staying very active going to the gym nearly every day to lift weights and do cardio on the treadmill and elliptical.     ROS: All other systems reviewed and are otherwise negative except as noted in History of Present Illness.  Studies Reviewed    ECG personally reviewed by me today: NSR, 61 bpm.  No significant changes from 09/13/2020.   Risk Assessment/Calculations      HYPERTENSION CONTROL Vitals:   12/26/22 0801 12/26/22 0831  BP: (!) 146/72 (!) 140/80    The patient's blood pressure is elevated above target today.  In order to address the patient's elevated BP: Blood pressure will be monitored at home to determine if medication changes need to be made.           Physical Exam    VS:  BP (!) 146/72 (BP Location: Left Arm, Patient Position: Sitting, Cuff Size: Normal)   Pulse 61   Ht 6\' 4"  (1.93 m)   Wt 274 lb 6.4 oz (124.5 kg)   SpO2 95%   BMI 33.40 kg/m  , BMI Body mass index is 33.4 kg/m.  GEN: Well nourished, well developed, in no acute distress.  Neck: No JVD or carotid bruits. Cardiac:  RRR. No murmurs. No rubs or gallops.   Respiratory:  Respirations regular and unlabored. Clear to auscultation without rales, wheezing or rhonchi. GI: Soft, nontender, nondistended. Extremities: Radials/DP/PT 2+ and equal bilaterally. No clubbing or cyanosis. No edema.  Skin: Warm and dry, no rash. Neuro: Strength intact.  Assessment & Plan    Nonobstructive CAD.  LHC August 2018 proximal LAD 50%, ostial D1 55%.  Recommended to treat medically.  Exercise stress test November 2018 was normal.  Echo January 2023 showed normal LV/RV function, Grade I DD.  Patient denies chest  pain, pressure, tightness. He is very active going to the gym nearly every day. Continue aspirin, atorvastatin, metoprolol, as needed SL NTG. Frequent PVCs.  7-day ZIO April 2022 showed 2 short bursts of VT, 13 short bursts of PSVT/PAT, and 4.6% PVCs.  Patient reports rare, brief palpitations described as skipping. They are not bothersome to him. EKG shows NSR with no ectopy. Continue metoprolol. Hypertension. BP today 146/72 on intake and 140/80 on recheck.  Patient denies headaches, dizziness or vision changes. He reports his PCP recently started him on telmisartan. He spot checks his BP at home and it is typically <130/80. He will contact his PCP if BP is consistently >130/80. Continue metoprolol and telmisartan. Hyperlipidemia.  LDL November 2023 65, at goal.  Continue atorvastatin.  Labs drawn by PCP.  Disposition: Refill NTG. Return in 1 year or sooner as needed.          Signed, Etta Grandchild. Davone Shinault, DNP, NP-C

## 2022-12-25 DIAGNOSIS — Z1283 Encounter for screening for malignant neoplasm of skin: Secondary | ICD-10-CM | POA: Diagnosis not present

## 2022-12-25 DIAGNOSIS — L570 Actinic keratosis: Secondary | ICD-10-CM | POA: Diagnosis not present

## 2022-12-25 DIAGNOSIS — D485 Neoplasm of uncertain behavior of skin: Secondary | ICD-10-CM | POA: Diagnosis not present

## 2022-12-26 ENCOUNTER — Ambulatory Visit: Payer: PPO | Attending: Student | Admitting: Student

## 2022-12-26 ENCOUNTER — Encounter: Payer: Self-pay | Admitting: Student

## 2022-12-26 VITALS — BP 140/80 | HR 61 | Ht 76.0 in | Wt 274.4 lb

## 2022-12-26 DIAGNOSIS — I2510 Atherosclerotic heart disease of native coronary artery without angina pectoris: Secondary | ICD-10-CM

## 2022-12-26 DIAGNOSIS — E785 Hyperlipidemia, unspecified: Secondary | ICD-10-CM

## 2022-12-26 DIAGNOSIS — I10 Essential (primary) hypertension: Secondary | ICD-10-CM | POA: Diagnosis not present

## 2022-12-26 DIAGNOSIS — I493 Ventricular premature depolarization: Secondary | ICD-10-CM

## 2022-12-26 MED ORDER — NITROGLYCERIN 0.4 MG SL SUBL: SUBLINGUAL_TABLET | SUBLINGUAL | 2 refills | Status: DC

## 2022-12-26 NOTE — Patient Instructions (Signed)
Medication Instructions:  Your physician recommends that you continue on your current medications as directed. Please refer to the Current Medication list given to you today.  *If you need a refill on your cardiac medications before your next appointment, please call your pharmacy*   Lab Work: NONE If you have labs (blood work) drawn today and your tests are completely normal, you will receive your results only by: MyChart Message (if you have MyChart) OR A paper copy in the mail If you have any lab test that is abnormal or we need to change your treatment, we will call you to review the results.   Testing/Procedures: NONE   Follow-Up: At [facility], you and your health needs are our priority.  As part of our continuing mission to provide you with exceptional heart care, we have created designated Provider Care Teams.  These Care Teams include your primary Cardiologist (physician) and Advanced Practice Providers (APPs -  Physician Assistants and Nurse Practitioners) who all work together to provide you with the care you need, when you need it.  We recommend signing up for the patient portal called "MyChart".  Sign up information is provided on this After Visit Summary.  MyChart is used to connect with patients for Virtual Visits (Telemedicine).  Patients are able to view lab/test results, encounter notes, upcoming appointments, etc.  Non-urgent messages can be sent to your provider as well.   To learn more about what you can do with MyChart, go to [email or web address].    Your next appointment:   1 year(s)  Provider:   David Harding, MD    

## 2022-12-31 ENCOUNTER — Other Ambulatory Visit (HOSPITAL_COMMUNITY): Payer: Self-pay

## 2023-01-03 ENCOUNTER — Other Ambulatory Visit (HOSPITAL_COMMUNITY): Payer: Self-pay

## 2023-01-23 ENCOUNTER — Inpatient Hospital Stay: Payer: PPO | Attending: Hematology

## 2023-01-23 DIAGNOSIS — R918 Other nonspecific abnormal finding of lung field: Secondary | ICD-10-CM | POA: Insufficient documentation

## 2023-01-23 DIAGNOSIS — D696 Thrombocytopenia, unspecified: Secondary | ICD-10-CM | POA: Insufficient documentation

## 2023-01-23 DIAGNOSIS — R7989 Other specified abnormal findings of blood chemistry: Secondary | ICD-10-CM | POA: Diagnosis not present

## 2023-01-23 DIAGNOSIS — C9110 Chronic lymphocytic leukemia of B-cell type not having achieved remission: Secondary | ICD-10-CM

## 2023-01-23 DIAGNOSIS — R944 Abnormal results of kidney function studies: Secondary | ICD-10-CM | POA: Diagnosis not present

## 2023-01-23 DIAGNOSIS — D649 Anemia, unspecified: Secondary | ICD-10-CM | POA: Diagnosis not present

## 2023-01-23 DIAGNOSIS — R161 Splenomegaly, not elsewhere classified: Secondary | ICD-10-CM | POA: Diagnosis not present

## 2023-01-23 DIAGNOSIS — R590 Localized enlarged lymph nodes: Secondary | ICD-10-CM | POA: Diagnosis not present

## 2023-01-23 DIAGNOSIS — R739 Hyperglycemia, unspecified: Secondary | ICD-10-CM | POA: Diagnosis not present

## 2023-01-23 DIAGNOSIS — C8300 Small cell B-cell lymphoma, unspecified site: Secondary | ICD-10-CM | POA: Insufficient documentation

## 2023-01-23 LAB — CBC WITH DIFFERENTIAL/PLATELET
Abs Immature Granulocytes: 0.08 10*3/uL — ABNORMAL HIGH (ref 0.00–0.07)
Basophils Absolute: 0 10*3/uL (ref 0.0–0.1)
Basophils Relative: 0 %
Eosinophils Absolute: 0.1 10*3/uL (ref 0.0–0.5)
Eosinophils Relative: 1 %
HCT: 41.8 % (ref 39.0–52.0)
Hemoglobin: 14.1 g/dL (ref 13.0–17.0)
Immature Granulocytes: 1 %
Lymphocytes Relative: 35 %
Lymphs Abs: 2.4 10*3/uL (ref 0.7–4.0)
MCH: 32.9 pg (ref 26.0–34.0)
MCHC: 33.7 g/dL (ref 30.0–36.0)
MCV: 97.4 fL (ref 80.0–100.0)
Monocytes Absolute: 0.3 10*3/uL (ref 0.1–1.0)
Monocytes Relative: 4 %
Neutro Abs: 4 10*3/uL (ref 1.7–7.7)
Neutrophils Relative %: 59 %
Platelets: 120 10*3/uL — ABNORMAL LOW (ref 150–400)
RBC: 4.29 MIL/uL (ref 4.22–5.81)
RDW: 13.6 % (ref 11.5–15.5)
WBC: 6.9 10*3/uL (ref 4.0–10.5)
nRBC: 0 % (ref 0.0–0.2)

## 2023-01-23 LAB — IRON AND TIBC
Iron: 90 ug/dL (ref 45–182)
Saturation Ratios: 28 % (ref 17.9–39.5)
TIBC: 321 ug/dL (ref 250–450)
UIBC: 231 ug/dL

## 2023-01-23 LAB — COMPREHENSIVE METABOLIC PANEL
ALT: 21 U/L (ref 0–44)
AST: 21 U/L (ref 15–41)
Albumin: 4.1 g/dL (ref 3.5–5.0)
Alkaline Phosphatase: 62 U/L (ref 38–126)
Anion gap: 7 (ref 5–15)
BUN: 29 mg/dL — ABNORMAL HIGH (ref 8–23)
CO2: 24 mmol/L (ref 22–32)
Calcium: 8.9 mg/dL (ref 8.9–10.3)
Chloride: 104 mmol/L (ref 98–111)
Creatinine, Ser: 1.17 mg/dL (ref 0.61–1.24)
GFR, Estimated: >60 mL/min (ref >60)
Glucose, Bld: 236 mg/dL — ABNORMAL HIGH (ref 70–99)
Potassium: 3.8 mmol/L (ref 3.5–5.1)
Sodium: 135 mmol/L (ref 135–145)
Total Bilirubin: 1.8 mg/dL — ABNORMAL HIGH (ref 0.3–1.2)
Total Protein: 6.4 g/dL — ABNORMAL LOW (ref 6.5–8.1)

## 2023-01-23 LAB — FERRITIN: Ferritin: 85 ng/mL (ref 24–336)

## 2023-01-23 LAB — LACTATE DEHYDROGENASE: LDH: 159 U/L (ref 98–192)

## 2023-01-24 ENCOUNTER — Ambulatory Visit (HOSPITAL_COMMUNITY)
Admission: RE | Admit: 2023-01-24 | Discharge: 2023-01-24 | Disposition: A | Discharge status: Home / Self Care | Payer: PPO | Source: Ambulatory Visit | Attending: Hematology | Admitting: Hematology

## 2023-01-24 DIAGNOSIS — C8300 Small cell B-cell lymphoma, unspecified site: Secondary | ICD-10-CM | POA: Insufficient documentation

## 2023-01-24 DIAGNOSIS — R590 Localized enlarged lymph nodes: Secondary | ICD-10-CM | POA: Diagnosis not present

## 2023-01-24 DIAGNOSIS — N281 Cyst of kidney, acquired: Secondary | ICD-10-CM | POA: Diagnosis not present

## 2023-01-24 DIAGNOSIS — C8590 Non-Hodgkin lymphoma, unspecified, unspecified site: Secondary | ICD-10-CM | POA: Diagnosis not present

## 2023-01-24 DIAGNOSIS — R161 Splenomegaly, not elsewhere classified: Secondary | ICD-10-CM | POA: Diagnosis not present

## 2023-01-24 DIAGNOSIS — C9110 Chronic lymphocytic leukemia of B-cell type not having achieved remission: Secondary | ICD-10-CM

## 2023-01-24 MED ORDER — IOHEXOL 300 MG/ML  SOLN
150.0000 mL | Freq: Once | INTRAMUSCULAR | Status: AC | PRN
  Administered 2023-01-24: 150 mL via INTRAVENOUS

## 2023-01-27 ENCOUNTER — Encounter: Payer: Self-pay | Admitting: *Deleted

## 2023-01-27 NOTE — Progress Notes (Signed)
Call report received from [facility] on CTCAP done on 7/12.  Results brought to attention of Dr. Ellin Saba.  Patient has follow up with him on Thursday.  Requested that soft tissue neck be moved up for reading, as it has not been read at this time.

## 2023-01-28 ENCOUNTER — Ambulatory Visit (INDEPENDENT_AMBULATORY_CARE_PROVIDER_SITE_OTHER): Payer: PPO | Admitting: Internal Medicine

## 2023-01-28 ENCOUNTER — Other Ambulatory Visit (HOSPITAL_COMMUNITY): Payer: Self-pay

## 2023-01-28 ENCOUNTER — Encounter: Payer: Self-pay | Admitting: Internal Medicine

## 2023-01-28 VITALS — BP 126/76 | HR 72 | Ht 76.0 in | Wt 272.2 lb

## 2023-01-28 DIAGNOSIS — E559 Vitamin D deficiency, unspecified: Secondary | ICD-10-CM | POA: Diagnosis not present

## 2023-01-28 DIAGNOSIS — I10 Essential (primary) hypertension: Secondary | ICD-10-CM

## 2023-01-28 DIAGNOSIS — I25119 Atherosclerotic heart disease of native coronary artery with unspecified angina pectoris: Secondary | ICD-10-CM | POA: Diagnosis not present

## 2023-01-28 DIAGNOSIS — R7303 Prediabetes: Secondary | ICD-10-CM | POA: Diagnosis not present

## 2023-01-28 DIAGNOSIS — E785 Hyperlipidemia, unspecified: Secondary | ICD-10-CM

## 2023-01-28 DIAGNOSIS — I700 Atherosclerosis of aorta: Secondary | ICD-10-CM | POA: Diagnosis not present

## 2023-01-28 MED ORDER — TELMISARTAN 20 MG PO TABS
20.0000 mg | ORAL_TABLET | Freq: Every day | ORAL | 1 refills | Status: DC
Start: 2023-01-28 — End: 2023-02-18

## 2023-01-28 NOTE — Assessment & Plan Note (Signed)
Has had cardiac cath, nuclear stress test - benign On Aspirin, statin and Metoprolol Followed by Cardiology

## 2023-01-28 NOTE — Assessment & Plan Note (Signed)
On statin Reviewed lipid profile

## 2023-01-28 NOTE — Patient Instructions (Addendum)
Please continue to take medications as prescribed.  Please continue to follow low salt diet and perform moderate exercise/walking at least 150 mins/week. 

## 2023-01-28 NOTE — Progress Notes (Signed)
Established Patient Office Visit  Subjective:  Patient ID: Adam Reyes, male    DOB: July 15, 1956  Age: 67 y.o. MRN: [id number]  CC:  Chief Complaint  Patient presents with   Hypertension    Four month follow up    HPI Adam Reyes is a 67 y.o. male with past medical history of CAD, HTN, HLD and lymphocytic lymphoma who presents for f/u of his chronic medical conditions.  HTN and CAD: His blood pressure was still elevated today, but it has been consistently within normal limits at home.  He has brought his home BP readings, which show around 110s-120s/70s.  He denies any headache, dizziness, chest pain, dyspnea. His palpitations have improved now with increased morning dose of Metoprolol.  He has been taking telmisartan and metoprolol regularly.  HLD: He takes Lipitor for HLD.     Past Medical History:  Diagnosis Date   Chronic lymphocytic leukemia (CLL), B-cell (HCC)    Small Cell Lymphoma   Coronary artery disease, non-occlusive 02/2017   Cardiac cath in setting of MI: 50 and 55% bifurcation LAD-Diag1   Enlarged lymph node    left neck   STEMI (ST elevation myocardial infarction) (HCC) 03/05/2017   hx/notes 03/05/2017 -likely aborted anterior STEMI with 50% bifurcation LAD-Diag1.  No PCI.  Preserved EF   Syncope due to orthostatic hypotension 04/21/2018    Past Surgical History:  Procedure Laterality Date   BIOPSY  10/10/2020   Procedure: BIOPSY;  Surgeon: Dolores Frame, MD;  Location: [facility];  Service: Gastroenterology;;   BIOPSY  11/14/2020   Procedure: BIOPSY;  Surgeon: Dolores Frame, MD;  Location: [facility];  Service: Gastroenterology;;   COLONOSCOPY N/A 10/11/2015   Procedure: COLONOSCOPY;  Surgeon: Malissa Hippo, MD;  Location: [facility];  Service: Endoscopy;  Laterality: N/A;  10/11/2015   COLONOSCOPY WITH PROPOFOL N/A 10/10/2020   Procedure: COLONOSCOPY WITH PROPOFOL;  Surgeon: Dolores Frame, MD;  [facility];  Service: Gastroenterology;  Laterality: N/A;  AM   ENTEROSCOPY N/A 11/14/2020   Procedure: push enteroscopy;  Surgeon: Dolores Frame, MD;  Location: [facility];  Service: Gastroenterology;  Laterality: N/A;   ESOPHAGOGASTRODUODENOSCOPY (EGD) WITH PROPOFOL N/A 10/10/2020   Procedure: ESOPHAGOGASTRODUODENOSCOPY (EGD) WITH PROPOFOL;  Surgeon: Dolores Frame, MD;  [facility]: [facility];  Service: Gastroenterology;  Laterality: N/A;   ESOPHAGOGASTRODUODENOSCOPY (EGD) WITH PROPOFOL N/A 11/14/2020   Procedure: ESOPHAGOGASTRODUODENOSCOPY (EGD) WITH PROPOFOL;  Surgeon: Dolores Frame, MD;  [facility]: [facility];  Service: Gastroenterology;  Laterality: N/A;  12:00   GIVENS CAPSULE STUDY N/A 11/07/2020   Procedure: GIVENS CAPSULE STUDY;  Surgeon: Dolores Frame, MD;  Location: [facility];  Service: Gastroenterology;  Laterality: N/A;  7:30 am   Graded Exercise Tolerance Test (GXT/ETT)  05/2017   10.7 METs (9: 25 min.  Reached 103% max predicted heart rate).  No EKG findings to suggest coronary ischemia.  Negative, low risk GXT   HEMOSTASIS CLIP PLACEMENT  11/14/2020   Procedure: HEMOSTASIS CLIP PLACEMENT;  Surgeon: Dolores Frame, MD;  Location: [facility];  Service: Gastroenterology;;  small bowel nodule   LEFT HEART CATH AND CORONARY ANGIOGRAPHY N/A 03/05/2017   Procedure: LEFT HEART CATH AND CORONARY ANGIOGRAPHY;  Surgeon: Marykay Lex, MD;  Location: [facility];  Service: Cardiovascular: pLAD-Diag1 50-55% (non-flow-limiiting).  EF ~50-55%.  although bifurcation lesion was presumed Culprit - no PCI (not flow limiting). - Med Rx.  MASS BIOPSY Left 06/27/2015   Procedure: OPEN LEFT NECK BIOPSY ;  Surgeon: Newman Pies, MD;  Location: [facility];  Service: ENT;  Laterality: Left;   POLYPECTOMY  10/10/2020   Procedure: POLYPECTOMY;  Surgeon: Dolores Frame, MD;  Location: [facility];  Service:  Gastroenterology;;   REFRACTIVE SURGERY Bilateral     Family History  Problem Relation Age of Onset   Diabetes Mother    Cancer Father    Cancer Brother     Social History   Socioeconomic History   Marital status: Married    Spouse name: Not on file   Number of children: Not on file   Years of education: Not on file   Highest education level: Not on file  Occupational History   Not on file  Tobacco Use   Smoking status: Former    Types: Cigarettes   Smokeless tobacco: Former    Types: Chew, Snuff   Tobacco comments:    03/06/2017 "quit smoking when I was young; quit chew/snuff in ~ 2008"  Vaping Use   Vaping status: Never Used  Substance and Sexual Activity   Alcohol use: Yes    Alcohol/week: 2.0 standard drinks of alcohol    Types: 2 Cans of beer per week   Drug use: No   Sexual activity: Yes  Other Topics Concern   Not on file  Social History Narrative   Married since 2001,third.Lives with wife.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   Social Determinants of Health   Financial Resource Strain: Low Risk  (05/16/2020)   Overall Financial Resource Strain (CARDIA)    Difficulty of Paying Living Expenses: Not hard at all  Food Insecurity: No Food Insecurity (05/16/2020)   Hunger Vital Sign    Worried About Running Out of Food in the Last Year: Never true    Ran Out of Food in the Last Year: Never true  Transportation Needs: No Transportation Needs (05/16/2020)   PRAPARE - Administrator, Civil Service (Medical): No    Lack of Transportation (Non-Medical): No  Physical Activity: Sufficiently Active (05/16/2020)   Exercise Vital Sign    Days of Exercise per Week: 5 days    Minutes of Exercise per Session: 30 min  Stress: No Stress Concern Present (05/16/2020)   [employer] of Occupational Health - Occupational Stress Questionnaire    Feeling of Stress : Not at all  Social Connections: Moderately Integrated (05/16/2020)   Social Connection and Isolation Panel  [NHANES]    Frequency of Communication with Friends and Family: More than three times a week    Frequency of Social Gatherings with Friends and Family: More than three times a week    Attends Religious Services: More than 4 times per year    Active Member of [employer] or Organizations: No    Attends Banker Meetings: Never    Marital Status: Married  Catering manager Violence: Not At Risk (05/16/2020)   Humiliation, Afraid, Rape, and Kick questionnaire    Fear of Current or Ex-Partner: No    Emotionally Abused: No    Physically Abused: No    Sexually Abused: No    Outpatient Medications Prior to Visit  Medication Sig Dispense Refill   aspirin 81 MG chewable tablet Chew 1 tablet (81 mg total) by mouth every other day. 30 tablet 11   atorvastatin (LIPITOR) 40 MG tablet KEEP OV. 90 tablet 0   cholecalciferol (VITAMIN D3) 25 MCG (1000 UNIT) tablet Take 10,000 Units  by mouth daily.     ferrous sulfate 324 (65 Fe) MG TBEC Take 324 mg by mouth every other day.     ibrutinib (IMBRUVICA) 420 MG tablet Take 1 tablet (420 mg total) by mouth daily. 30 tablet 11   metoprolol tartrate (LOPRESSOR) 25 MG tablet Take 1 tablet (25 mg total) by mouth in the morning AND 0.5 tablets (12.5 mg total) every evening. 135 tablet 1   nitroGLYCERIN (NITROSTAT) 0.4 MG SL tablet PLACE 1 TABLET UNDER THE TONGUE EVERY 5 MINUTES AS NEEDED FOR CHEST PAIN. ONLY UP TO 3 DOSES. 25 tablet 2   telmisartan (MICARDIS) 20 MG tablet TAKE 1 TABLET(20 MG) BY MOUTH DAILY 30 tablet 2   No facility-administered medications prior to visit.    No Known Allergies  ROS Review of Systems  Constitutional:  Negative for chills and fever.  HENT:  Negative for congestion and sore throat.   Eyes:  Negative for pain and discharge.  Respiratory:  Negative for cough and shortness of breath.   Cardiovascular:  Positive for palpitations. Negative for chest pain.  Gastrointestinal:  Negative for constipation, diarrhea, nausea and  vomiting.  Endocrine: Negative for polydipsia and polyuria.  Genitourinary:  Negative for dysuria and hematuria.  Musculoskeletal:  Negative for neck pain and neck stiffness.  Skin:  Negative for rash.  Neurological:  Negative for dizziness, weakness, numbness and headaches.  Psychiatric/Behavioral:  Negative for agitation and behavioral problems.       Objective:    Physical Exam Vitals reviewed.  Constitutional:      General: He is not in acute distress.    Appearance: He is not diaphoretic.  HENT:     Head: Normocephalic and atraumatic.     Nose: Nose normal.     Mouth/Throat:     Mouth: Mucous membranes are moist.  Eyes:     General: No scleral icterus.    Extraocular Movements: Extraocular movements intact.  Cardiovascular:     Rate and Rhythm: Normal rate and regular rhythm.     Pulses: Normal pulses.     Heart sounds: Normal heart sounds. No murmur heard. Pulmonary:     Breath sounds: Normal breath sounds. No wheezing or rales.  Abdominal:     Palpations: Abdomen is soft.     Tenderness: There is no abdominal tenderness.  Musculoskeletal:     Cervical back: Neck supple. No tenderness.     Right lower leg: No edema.     Left lower leg: No edema.  Lymphadenopathy:     Cervical: Cervical adenopathy present.  Skin:    General: Skin is warm.     Findings: No rash.  Neurological:     General: No focal deficit present.     Mental Status: He is alert and oriented to person, place, and time.  Psychiatric:        Mood and Affect: Mood normal.        Behavior: Behavior normal.     BP 126/76 (BP Location: Left Arm)   Pulse 72   Ht 6\' 4"  (1.93 m)   Wt 272 lb 3.2 oz (123.5 kg)   SpO2 93%   BMI 33.13 kg/m  Wt Readings from Last 3 Encounters:  01/30/23 270 lb (122.5 kg)  01/28/23 272 lb 3.2 oz (123.5 kg)  12/26/22 274 lb 6.4 oz (124.5 kg)    Lab Results  Component Value Date   TSH 2.630 05/29/2022   Lab Results  Component Value Date   WBC 6.9  01/23/2023    HGB 14.1 01/23/2023   HCT 41.8 01/23/2023   MCV 97.4 01/23/2023   PLT 120 (L) 01/23/2023   Lab Results  Component Value Date   NA 135 01/23/2023   K 3.8 01/23/2023   CO2 24 01/23/2023   GLUCOSE 236 (H) 01/23/2023   BUN 29 (H) 01/23/2023   CREATININE 1.17 01/23/2023   BILITOT 1.8 (H) 01/23/2023   ALKPHOS 62 01/23/2023   AST 21 01/23/2023   ALT 21 01/23/2023   PROT 6.4 (L) 01/23/2023   ALBUMIN 4.1 01/23/2023   CALCIUM 8.9 01/23/2023   ANIONGAP 7 01/23/2023   EGFR 76 05/29/2022   Lab Results  Component Value Date   CHOL 118 05/29/2022   Lab Results  Component Value Date   HDL 30 (L) 05/29/2022   Lab Results  Component Value Date   LDLCALC 65 05/29/2022   Lab Results  Component Value Date   TRIG 130 05/29/2022   Lab Results  Component Value Date   CHOLHDL 3.9 05/29/2022   Lab Results  Component Value Date   HGBA1C 6.3 (H) 05/29/2022      Assessment & Plan:   Problem List Items Addressed This Visit       Cardiovascular and Mediastinum   Coronary artery disease involving native coronary artery of native heart with angina pectoris (HCC) (Chronic)    Has had cardiac cath, nuclear stress test - benign On Aspirin, statin and Metoprolol Followed by Cardiology      Relevant Medications   telmisartan (MICARDIS) 20 MG tablet   Essential hypertension - Primary    BP Readings from Last 1 Encounters:  01/30/23 (!) 146/81   Well-controlled with Metoprolol and Telmisartan 20 mg QD at home Had palpitations almost daily, improved with increased dose of Metoprolol 25 mg QAM and 12.5 mg QPM Counseled for compliance with the medications Advised DASH diet and moderate exercise/walking, at least 150 mins/week      Relevant Medications   telmisartan (MICARDIS) 20 MG tablet   Other Relevant Orders   TSH   CMP14+EGFR   Aortic atherosclerosis (HCC)    Noted on imaging On Aspirin and statin      Relevant Medications   telmisartan (MICARDIS) 20 MG tablet      Other   Dyslipidemia, goal LDL below 70 (Chronic)    On statin Reviewed lipid profile      Relevant Medications   telmisartan (MICARDIS) 20 MG tablet   Other Relevant Orders   Lipid panel   Prediabetes    Lab Results  Component Value Date   HGBA1C 6.3 (H) 05/29/2022   Recent CMP showed blood glucose of 236, nonfasting Check CMP and HbA1c Follow DASH diet      Relevant Orders   Hemoglobin A1c   CMP14+EGFR   Other Visit Diagnoses     Vitamin D deficiency       Relevant Orders   VITAMIN D 25 Hydroxy (Vit-D Deficiency, Fractures)       Meds ordered this encounter  Medications   telmisartan (MICARDIS) 20 MG tablet    Sig: Take 1 tablet (20 mg total) by mouth daily.    Dispense:  90 tablet    Refill:  1    Follow-up: Return in about 4 months (around 05/31/2023) for Annual physical.    Anabel Halon, MD

## 2023-01-28 NOTE — Assessment & Plan Note (Signed)
Noted on imaging °On Aspirin and statin °

## 2023-01-28 NOTE — Assessment & Plan Note (Addendum)
BP Readings from Last 1 Encounters:  01/30/23 (!) 146/81   Well-controlled with Metoprolol and Telmisartan 20 mg QD at home Had palpitations almost daily, improved with increased dose of Metoprolol 25 mg QAM and 12.5 mg QPM Counseled for compliance with the medications Advised DASH diet and moderate exercise/walking, at least 150 mins/week

## 2023-01-29 NOTE — Progress Notes (Signed)
[facility] [address] [place] [address]    Clinic Day:  01/30/2023  Referring physician: Anabel Halon, MD  Patient Care Team: Anabel Halon, MD as PCP - General (Internal Medicine) Marykay Lex, MD as PCP - Cardiology (Cardiology) Doreatha Massed, MD as Consulting Physician (Oncology)   ASSESSMENT & PLAN:   Assessment: 1.  Stage IV small lymphocytic lymphoma: -CLL FISH panel normal, Ig HV positive, T p53 negative. -Ibrutinib 420 mg started on 12/01/2017, 6 cycles of rituximab from 12/14/2017 through 05/21/2018, ibrutinib discontinued on 01/30/2023 with progression 1.   2.  Health maintenance: - Colonoscopy on 10/10/2020 showed multiple polyps with no bleeding areas. - EGD on 10/10/2020 showed normal esophagus, few gastric polyps, gastric erosions with no stigmata of recent bleeding. - Push enteroscopy on 11/14/2020 did not reveal any bleeding issues.  Plan: 1.  Stage IV small lymphocytic lymphoma: - He is taking Ibrutinib without any major problems. - He reported left lower quadrant pain on and off for the last 3 to 4 weeks.  He reports taking Aleve twice daily as needed. - Reviewed CT CAP from 01/24/2023: Increasing adenopathy above and below the diaphragm with new splenomegaly.  Increased size of some scattered bilateral lung nodules. - CT soft tissue neck: Compared to September 2023, bilateral cervical lymphadenopathy has worsened. - Reviewed labs from 01/23/2023: LDH 159.  LFTs show elevated total bilirubin 1.8.  Creatinine 1.17 and normal.  CBC shows slight worsening of thrombocytopenia, likely from splenomegaly. - His disease has progressed on Ibrutinib.  I have told him to discontinue Ibrutinib. - Recommend PET scan to evaluate for any Richter's transformation.  Based on the PET scan we will see if he needs any biopsy. - Recommend changing his therapy to obinutuzumab and venetoclax regimen.    2.  Mild thrombocytopenia: - Slight worsening of  platelet count 120 from splenomegaly.  Will closely monitor.   3.  Mild hyperbilirubinemia: - This is from Ibrutinib.  Total bilirubin increased to 1.8.  We are stopping Ibrutinib.   4.  High risk drug monitoring: - Heart rate is regular with no signs of atrial fibrillation.  No bleeding issues reported.   5.  Normocytic anemia: - Continue iron tablet every other day.  Ferritin improved to 85.  Hemoglobin normal at 14.1.  Orders Placed This Encounter  Procedures   NM PET Image Restag (PS) Skull Base To Thigh    Standing Status:   Future    Standing Expiration Date:   01/30/2024    Order Specific Question:   If indicated for the ordered procedure, I authorize the administration of a radiopharmaceutical per Radiology protocol    Answer:   Yes    Order Specific Question:   Preferred imaging location?    Answer:   [facility]   Uric acid    Standing Status:   Future    Standing Expiration Date:   01/30/2024    Order Specific Question:   Release to patient    Answer:   Immediate       I,Helena R Teague,acting as a scribe for Doreatha Massed, MD.,have documented all relevant documentation on the behalf of Doreatha Massed, MD,as directed by  Doreatha Massed, MD while in the presence of Doreatha Massed, MD.  I, Doreatha Massed MD, have reviewed the above documentation for accuracy and completeness, and I agree with the above.    Doreatha Massed, MD   7/18/20245:47 PM  CHIEF COMPLAINT:   Diagnosis:  small lymphocytic lymphoma and symptomatic anemia    Cancer Staging  No matching staging information was found for the patient.    Prior Therapy: Rituximab monthly from 01/01/2018 to 05/21/2018   Current Therapy:  Ibrutinib 420 mg daily; intermittent Feraheme last on 09/29/2020    HISTORY OF PRESENT ILLNESS:   Oncology History  Chronic lymphocytic leukemia (CLL), B-cell (HCC)  06/14/2015 Imaging   CT neck- Bulky adenopathy throughout the neck  bilaterally. There also are enlarged parotid lymph nodes bilaterally. There is bilateral axillary adenopathy as well as mediastinal adenopathy. Findings are consistent with lymphoma. Biopsy recommended.   06/27/2015 Procedure   Left neck lymph node biopsy by Dr. Suszanne Conners   06/27/2015 Pathology Results   Diagnosis Lymph node for lymphoma, Left neck node for lymphoma work up - SMALL LYMPHOCYTIC LYMPHOMA.  LOW GRADE.   06/27/2015 Pathology Results   Tissue-Flow Cytometry - MONOCLONAL B CELL POPULATION IDENTIFIED. The phenotypic features are consistent with small lymphocytic lymphoma/chronic lymphocytic leukemia and correlate well with the morphology in the lymph node   Lymphoma, small lymphocytic (HCC)  10/14/2017 Initial Diagnosis   Lymphoma, small lymphocytic (HCC)   01/01/2018 - 05/21/2018 Chemotherapy   The patient had riTUXimab (RITUXAN) 900 mg in sodium chloride 0.9 % 250 mL (2.6471 mg/mL) infusion, 375 mg/m2 = 900 mg, Intravenous,  Once, 6 of 6 cycles Dose modification: 500 mg/m2 (original dose 500 mg/m2, Cycle 2, Reason: Provider Judgment) Administration: 900 mg (01/01/2018), 1,300 mg (01/29/2018), 1,300 mg (02/26/2018), 1,300 mg (03/26/2018), 1,300 mg (04/23/2018), 1,300 mg (05/21/2018)  for chemotherapy treatment.       INTERVAL HISTORY:   Adam Reyes is a 67 y.o. male presenting to clinic today for follow up of small lymphocytic lymphoma and symptomatic anemia. He was last seen by me on 09/23/22.  He underwent a CT of the abd/pelvis on 7/12 that found increased adenopathy above and below the diaphragm with new splenomegaly compatible with worsening lymphomatous disease and increased size of some of the scattered bilateral pulmonary nodules, suggestive of lymphomatous disease involvement. CT of the neck on 7/12 found bilateral cervical lymphadenopathy worsened, concerning for disease progression  His latest CBC from 7/11 found decreased platelets at 120. His latest CMP found elevated glucose at  236, elevated BUN at 29, decreased total protein at 6.4, and elevated total bilirubin at 1.8. His Iron and TIBC< Ferritin, and LDH labs from 7/11 were WNL.   Today, he states that he is doing well overall. His appetite level is at 100%. His energy level is at 75%.   PAST MEDICAL HISTORY:   Past Medical History: Past Medical History:  Diagnosis Date   Chronic lymphocytic leukemia (CLL), B-cell (HCC)    Small Cell Lymphoma   Coronary artery disease, non-occlusive 02/2017   Cardiac cath in setting of MI: 50 and 55% bifurcation LAD-Diag1   Enlarged lymph node    left neck   STEMI (ST elevation myocardial infarction) (HCC) 03/05/2017   hx/notes 03/05/2017 -likely aborted anterior STEMI with 50% bifurcation LAD-Diag1.  No PCI.  Preserved EF   Syncope due to orthostatic hypotension 04/21/2018    Surgical History: Past Surgical History:  Procedure Laterality Date   BIOPSY  10/10/2020   Procedure: BIOPSY;  Surgeon: Dolores Frame, MD;  [facility];  Service: Gastroenterology;;   BIOPSY  11/14/2020   Procedure: BIOPSY;  Surgeon: Dolores Frame, MD;  [facility];  Service: Gastroenterology;;   COLONOSCOPY N/A 10/11/2015   Procedure: COLONOSCOPY;  Surgeon: Joline Maxcy  Karilyn Cota, MD;  [facility];  Service: Endoscopy;  Laterality: N/A;  10/11/2015   COLONOSCOPY WITH PROPOFOL N/A 10/10/2020   Procedure: COLONOSCOPY WITH PROPOFOL;  Surgeon: Dolores Frame, MD;  [facility]: [facility];  Service: Gastroenterology;  Laterality: N/A;  AM   ENTEROSCOPY N/A 11/14/2020   Procedure: push enteroscopy;  Surgeon: Dolores Frame, MD;  [facility];  Service: Gastroenterology;  Laterality: N/A;   ESOPHAGOGASTRODUODENOSCOPY (EGD) WITH PROPOFOL N/A 10/10/2020   Procedure: ESOPHAGOGASTRODUODENOSCOPY (EGD) WITH PROPOFOL;  Surgeon: Dolores Frame, MD;  [facility];  Service: Gastroenterology;  Laterality: N/A;    ESOPHAGOGASTRODUODENOSCOPY (EGD) WITH PROPOFOL N/A 11/14/2020   Procedure: ESOPHAGOGASTRODUODENOSCOPY (EGD) WITH PROPOFOL;  Surgeon: Dolores Frame, MD;  [facility];  Service: Gastroenterology;  Laterality: N/A;  12:00   GIVENS CAPSULE STUDY N/A 11/07/2020   Procedure: GIVENS CAPSULE STUDY;  Surgeon: Dolores Frame, MD;  [facility]: [facility];  Service: Gastroenterology;  Laterality: N/A;  7:30 am   Graded Exercise Tolerance Test (GXT/ETT)  05/2017   10.7 METs (9: 25 min.  Reached 103% max predicted heart rate).  No EKG findings to suggest coronary ischemia.  Negative, low risk GXT   HEMOSTASIS CLIP PLACEMENT  11/14/2020   Procedure: HEMOSTASIS CLIP PLACEMENT;  Surgeon: Dolores Frame, MD;  [facility];  Service: Gastroenterology;;  small bowel nodule   LEFT HEART CATH AND CORONARY ANGIOGRAPHY N/A 03/05/2017   Procedure: LEFT HEART CATH AND CORONARY ANGIOGRAPHY;  Surgeon: Marykay Lex, MD;  Location: [facility];  Service: Cardiovascular: pLAD-Diag1 50-55% (non-flow-limiiting).  EF ~50-55%.  although bifurcation lesion was presumed Culprit - no PCI (not flow limiting). - Med Rx.   MASS BIOPSY Left 06/27/2015   Procedure: OPEN LEFT NECK BIOPSY ;  Surgeon: Newman Pies, MD;  Location: [facility];  Service: ENT;  Laterality: Left;   POLYPECTOMY  10/10/2020   Procedure: POLYPECTOMY;  Surgeon: Dolores Frame, MD;  [facility];  Service: Gastroenterology;;   REFRACTIVE SURGERY Bilateral     Social History: Social History   Socioeconomic History   Marital status: Married    Spouse name: Not on file   Number of children: Not on file   Years of education: Not on file   Highest education level: Not on file  Occupational History   Not on file  Tobacco Use   Smoking status: Former    Types: Cigarettes   Smokeless tobacco: Former    Types: Chew, Snuff   Tobacco comments:    03/06/2017 "quit smoking  when I was young; quit chew/snuff in ~ 2008"  Vaping Use   Vaping status: Never Used  Substance and Sexual Activity   Alcohol use: Yes    Alcohol/week: 2.0 standard drinks of alcohol    Types: 2 Cans of beer per week   Drug use: No   Sexual activity: Yes  Other Topics Concern   Not on file  Social History Narrative   Married since 2001,third.Lives with wife.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   Social Determinants of Health   Financial Resource Strain: Low Risk  (05/16/2020)   Overall Financial Resource Strain (CARDIA)    Difficulty of Paying Living Expenses: Not hard at all  Food Insecurity: No Food Insecurity (05/16/2020)   Hunger Vital Sign    Worried About Running Out of Food in the Last Year: Never true    Ran Out of Food in the Last Year: Never  true  Transportation Needs: No Transportation Needs (05/16/2020)   PRAPARE - Administrator, Civil Service (Medical): No    Lack of Transportation (Non-Medical): No  Physical Activity: Sufficiently Active (05/16/2020)   Exercise Vital Sign    Days of Exercise per Week: 5 days    Minutes of Exercise per Session: 30 min  Stress: No Stress Concern Present (05/16/2020)   [employer] of Occupational Health - Occupational Stress Questionnaire    Feeling of Stress : Not at all  Social Connections: Moderately Integrated (05/16/2020)   Social Connection and Isolation Panel [NHANES]    Frequency of Communication with Friends and Family: More than three times a week    Frequency of Social Gatherings with Friends and Family: More than three times a week    Attends Religious Services: More than 4 times per year    Active Member of [employer] or Organizations: No    Attends Banker Meetings: Never    Marital Status: Married  Catering manager Violence: Not At Risk (05/16/2020)   Humiliation, Afraid, Rape, and Kick questionnaire    Fear of Current or Ex-Partner: No    Emotionally Abused: No    Physically Abused: No    Sexually  Abused: No    Family History: Family History  Problem Relation Age of Onset   Diabetes Mother    Cancer Father    Cancer Brother     Current Medications:  Current Outpatient Medications:    aspirin 81 MG chewable tablet, Chew 1 tablet (81 mg total) by mouth every other day., Disp: 30 tablet, Rfl: 11   atorvastatin (LIPITOR) 40 MG tablet, KEEP OV., Disp: 90 tablet, Rfl: 0   cholecalciferol (VITAMIN D3) 25 MCG (1000 UNIT) tablet, Take 10,000 Units by mouth daily., Disp: , Rfl:    ferrous sulfate 324 (65 Fe) MG TBEC, Take 324 mg by mouth every other day., Disp: , Rfl:    ibrutinib (IMBRUVICA) 420 MG tablet, Take 1 tablet (420 mg total) by mouth daily., Disp: 30 tablet, Rfl: 11   metoprolol tartrate (LOPRESSOR) 25 MG tablet, Take 1 tablet (25 mg total) by mouth in the morning AND 0.5 tablets (12.5 mg total) every evening., Disp: 135 tablet, Rfl: 1   nitroGLYCERIN (NITROSTAT) 0.4 MG SL tablet, PLACE 1 TABLET UNDER THE TONGUE EVERY 5 MINUTES AS NEEDED FOR CHEST PAIN. ONLY UP TO 3 DOSES., Disp: 25 tablet, Rfl: 2   telmisartan (MICARDIS) 20 MG tablet, Take 1 tablet (20 mg total) by mouth daily., Disp: 90 tablet, Rfl: 1   Allergies: No Known Allergies  REVIEW OF SYSTEMS:   Review of Systems  Constitutional:  Negative for chills, fatigue and fever.  HENT:   Negative for lump/mass, mouth sores, nosebleeds, sore throat and trouble swallowing.   Eyes:  Negative for eye problems.  Respiratory:  Negative for cough and shortness of breath.   Cardiovascular:  Negative for chest pain, leg swelling and palpitations.  Gastrointestinal:  Positive for abdominal pain. Negative for constipation, diarrhea, nausea and vomiting.  Genitourinary:  Negative for bladder incontinence, difficulty urinating, dysuria, frequency, hematuria and nocturia.   Musculoskeletal:  Negative for arthralgias, back pain, flank pain, myalgias and neck pain.  Skin:  Negative for itching and rash.  Neurological:  Negative for  dizziness, headaches and numbness.  Hematological:  Does not bruise/bleed easily.  Psychiatric/Behavioral:  Negative for depression, sleep disturbance and suicidal ideas. The patient is not nervous/anxious.   All other systems reviewed and are negative.  VITALS:   Blood pressure (!) 146/81, pulse 72, temperature 98 F (36.7 C), resp. rate 20, weight 270 lb (122.5 kg), SpO2 97%.  Wt Readings from Last 3 Encounters:  01/30/23 270 lb (122.5 kg)  01/28/23 272 lb 3.2 oz (123.5 kg)  12/26/22 274 lb 6.4 oz (124.5 kg)    Body mass index is 32.87 kg/m.  Performance status (ECOG): 1 - Symptomatic but completely ambulatory  PHYSICAL EXAM:   Physical Exam Vitals and nursing note reviewed. Exam conducted with a chaperone present.  Constitutional:      Appearance: Normal appearance.  Cardiovascular:     Rate and Rhythm: Normal rate and regular rhythm.     Pulses: Normal pulses.     Heart sounds: Normal heart sounds.  Pulmonary:     Effort: Pulmonary effort is normal.     Breath sounds: Normal breath sounds.  Abdominal:     Palpations: Abdomen is soft. There is no hepatomegaly, splenomegaly or mass.     Tenderness: There is no abdominal tenderness.  Musculoskeletal:     Right lower leg: No edema.     Left lower leg: No edema.  Lymphadenopathy:     Cervical: No cervical adenopathy.     Right cervical: No superficial, deep or posterior cervical adenopathy.    Left cervical: No superficial, deep or posterior cervical adenopathy.     Upper Body:     Right upper body: No supraclavicular or axillary adenopathy.     Left upper body: No supraclavicular or axillary adenopathy.  Neurological:     General: No focal deficit present.     Mental Status: He is alert and oriented to person, place, and time.  Psychiatric:        Mood and Affect: Mood normal.        Behavior: Behavior normal.     LABS:      Latest Ref Rng & Units 01/23/2023   10:11 AM 09/16/2022    3:10 PM 04/11/2022     2:23 PM  CBC  WBC 4.0 - 10.5 K/uL 6.9  8.6  6.4   Hemoglobin 13.0 - 17.0 g/dL 40.9  81.1  91.4   Hematocrit 39.0 - 52.0 % 41.8  45.0  47.1   Platelets 150 - 400 K/uL 120  148  146       Latest Ref Rng & Units 01/23/2023   10:11 AM 09/16/2022    3:10 PM 05/29/2022    9:04 AM  CMP  Glucose 70 - 99 mg/dL 782  956  213   BUN 8 - 23 mg/dL 29  21  19    Creatinine 0.61 - 1.24 mg/dL 0.86  5.78  4.69   Sodium 135 - 145 mmol/L 135  136  141   Potassium 3.5 - 5.1 mmol/L 3.8  3.6  3.9   Chloride 98 - 111 mmol/L 104  105  102   CO2 22 - 32 mmol/L 24  23  23    Calcium 8.9 - 10.3 mg/dL 8.9  8.7  9.2   Total Protein 6.5 - 8.1 g/dL 6.4  6.2  6.4   Total Bilirubin 0.3 - 1.2 mg/dL 1.8  1.3  1.3   Alkaline Phos 38 - 126 U/L 62  60  61   AST 15 - 41 U/L 21  23  30    ALT 0 - 44 U/L 21  22  38      No results found for: "CEA1", "CEA" / No results found for: "CEA1", "  CEA" No results found for: "PSA1" No results found for: "CAN199" No results found for: "CAN125"  Lab Results  Component Value Date   TOTALPROTELP 6.5 07/11/2015   TOTALPROTELP 6.4 07/11/2015   ALBUMINELP 3.7 07/11/2015   A1GS 0.3 07/11/2015   A2GS 0.5 07/11/2015   BETS 1.0 07/11/2015   GAMS 1.0 07/11/2015   MSPIKE 0.4 (H) 07/11/2015   SPEI Comment 07/11/2015   Lab Results  Component Value Date   TIBC 321 01/23/2023   TIBC 341 09/16/2022   TIBC 375 04/11/2022   FERRITIN 85 01/23/2023   FERRITIN 41 09/16/2022   FERRITIN 41 04/11/2022   IRONPCTSAT 28 01/23/2023   IRONPCTSAT 28 09/16/2022   IRONPCTSAT 34 04/11/2022   Lab Results  Component Value Date   LDH 159 01/23/2023   LDH 142 09/16/2022   LDH 168 04/11/2022     STUDIES:   CT SOFT TISSUE NECK W CONTRAST  Result Date: 01/27/2023 CLINICAL DATA:  Lymphoma, ongoing chemo. EXAM: CT NECK WITH CONTRAST TECHNIQUE: Multidetector CT imaging of the neck was performed using the standard protocol following the bolus administration of intravenous contrast. RADIATION DOSE  REDUCTION: This exam was performed according to the departmental dose-optimization program which includes automated exposure control, adjustment of the mA and/or kV according to patient size and/or use of iterative reconstruction technique. CONTRAST:  OMNIPAQUE IOHEXOL 300 MG/ML  SOLN COMPARISON:  CT neck 04/11/2022 FINDINGS: Pharynx and larynx: The nasal cavity and nasopharynx are unremarkable. The oral cavity and oropharynx are unremarkable. The parapharyngeal spaces are clear. The hypopharynx and larynx are unremarkable. The vocal folds are normal in appearance. The epiglottis is normal. There is no retropharyngeal collection. The airway is patent. Salivary glands: The parotid and submandibular glands are unremarkable. Thyroid: Unremarkable. Lymph nodes: Multiple enlarged lymph nodes are again seen: *The 2.2 cm short axis right level IB lymph node is increased in bulk in the coronal plane (4-43, 2-57). *1.8 cm short axis right level IIA lymph node is slightly increased in size from 1.4 cm (2-48). *1.5 cm short axis right level IIB lymph node more posteriorly is increased in size from 1.1 cm (2-41). *Multiple level III and IV lymph nodes are also overall slightly increased in size. *1.0 cm left level IIA lymph node is slightly increased in size from 0.9 cm (2-49). *The 1.4 cm short axis left level IIA/III node is increased in size from 1.0 cm (2-64). 1.3 cm left level III node is similar in size (2-69). 1.2 cm left level IV node is stable to slightly increased in size (2-81). *Partially imaged bilateral subpectoral lymphadenopathy appears increased. Vascular: The major vasculature of the neck is unremarkable. Limited intracranial: Unremarkable. Visualized orbits: Bilateral lens implants are in place. The imaged globes and orbits are otherwise unremarkable. Mastoids and visualized paranasal sinuses: There is mild mucosal thickening in the left maxillary sinus. The mastoid air cells and middle ear cavities are  clear. There is probable cerumen in both external auditory canals. Skeleton: There is no acute osseous abnormality or suspicious osseous lesion. Upper chest: Reported separately. Other: The previously seen calcified inclusion cyst in the left neck is decreased in size. IMPRESSION: Since the CT neck from 04/11/2022, bilateral cervical lymphadenopathy has worsened, concerning for disease progression. Electronically Signed   By: Lesia Hausen M.D.   On: 01/27/2023 10:34   CT CHEST ABDOMEN PELVIS W CONTRAST  Result Date: 01/24/2023 CLINICAL DATA:  History of lymphoma, monitor. * Tracking Code: BO *. EXAM: CT CHEST, ABDOMEN, AND PELVIS  WITH CONTRAST TECHNIQUE: Multidetector CT imaging of the chest, abdomen and pelvis was performed following the standard protocol during bolus administration of intravenous contrast. RADIATION DOSE REDUCTION: This exam was performed according to the departmental dose-optimization program which includes automated exposure control, adjustment of the mA and/or kV according to patient size and/or use of iterative reconstruction technique. CONTRAST:  OMNIPAQUE IOHEXOL 300 MG/ML  SOLN COMPARISON:  Multiple priors including most recent CT April 11, 2022. FINDINGS: CT CHEST FINDINGS Cardiovascular: Normal caliber thoracic aorta. No central pulmonary embolus on this nondedicated study. Coronary artery calcifications. Normal size heart. No significant pericardial effusion/thickening. Mediastinum/Nodes: Increased size of thoracic inlet, mediastinal, axillary and subpectoral lymph nodes. For reference: -thoracic inlet lymph node measures 15 mm in short axis on image 11/2 previously 7 mm. -right subpectoral lymph node measures 13 mm in short axis on image 15/2, previously 6 mm. -paraesophageal lymph node measures 16 mm in short axis on image 25/2 previously 5 mm. No suspicious thyroid nodule. The esophagus is grossly unremarkable. Lungs/Pleura: Increased size of some of the scattered  bilateral pulmonary nodules with other stable in size from prior. For reference: -right upper lobe perifissural pulmonary nodule measures 10 mm on image 84/3 previously 7 mm -right upper lobe perifissural pulmonary nodule measures 10 mm on image 88/3 previously 5 mm. -left lower lobe subpleural pulmonary nodule measures 6 mm on image 112/3 previously 4 mm. -right lower lobe pulmonary nodule measures 7 mm on image 108/3, unchanged. No pleural effusion.  No pneumothorax. Musculoskeletal: No aggressive lytic or blastic lesion of bone. CT ABDOMEN PELVIS FINDINGS Hepatobiliary: No suspicious hepatic lesion. Gallbladder is unremarkable. No biliary ductal dilation. Pancreas: No pancreatic ductal dilation or evidence of acute inflammation. Spleen: Splenomegaly measuring 18.4 cm in maximum axial dimension. Adrenals/Urinary Tract: Bilateral adrenal glands appear normal. Fluid signal 6.7 cm left upper pole renal cyst. Kidneys demonstrate symmetric enhancement. Urinary bladder is unremarkable for degree of distension. Stomach/Bowel: No pathologic dilation of small or large bowel. Colonic diverticulosis without findings of acute diverticulitis. No evidence of acute bowel inflammation. Vascular/Lymphatic: Increased size and number of the retrocrural, retroperitoneal, right upper quadrant, left-greater-than-right iliac side chain and inguinal lymph nodes. For reference: -left retrocrural lymph node measures 2.2 cm in short axis on image 59/2 previously 1.6 cm -left periaortic lymph node conglomerate measures 6.5 cm in short axis on image 81/2 -left external iliac lymph node measures 3.6 cm in short axis on image 121/2 previously 1.7 cm. Aortic atherosclerosis. Effacement of the abdominal aorta by retroperitoneal adenopathy. The portal, splenic and superior mesenteric veins are patent. Reproductive: Prostate gland is unremarkable. Other: Small fat containing right inguinal hernia. Musculoskeletal: Chronic bilateral L5 pars defects.  No aggressive lytic or blastic lesion of bone. Multilevel degenerative change of the spine. IMPRESSION: 1. Increased adenopathy above and below the diaphragm with new splenomegaly compatible with worsening lymphomatous disease. 2. Increased size of some of the scattered bilateral pulmonary nodules, suggestive of lymphomatous disease involvement. 3.  Aortic Atherosclerosis (ICD10-I70.0). These results will be called to the ordering clinician or representative by the Radiologist Assistant, and communication documented in the PACS or [employer]. Electronically Signed   By: Maudry Mayhew M.D.   On: 01/24/2023 15:57

## 2023-01-30 ENCOUNTER — Inpatient Hospital Stay: Payer: PPO | Admitting: Hematology

## 2023-01-30 ENCOUNTER — Encounter: Payer: Self-pay | Admitting: Hematology

## 2023-01-30 ENCOUNTER — Other Ambulatory Visit (HOSPITAL_COMMUNITY): Payer: Self-pay

## 2023-01-30 VITALS — BP 146/81 | HR 72 | Temp 98.0°F | Resp 20 | Wt 270.0 lb

## 2023-01-30 DIAGNOSIS — C8300 Small cell B-cell lymphoma, unspecified site: Secondary | ICD-10-CM | POA: Diagnosis not present

## 2023-01-30 NOTE — Patient Instructions (Addendum)
[facility] at [facility] Discharge Instructions   You were seen and examined today by Dr. Ellin Saba.  He reviewed the results of your CT scan. It is showing lymph nodes have grown above and below your diaphragm. Dr. Kirtland Bouchard has recommended we get a PET scan to investigate this further.   Dr. 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   Stop taking Imbruvica. He discussed with you treatment with an infusion and a pill. The infusion medication is called Gazyva. The pill is called Venetoclax.   We will obtain the PET scan and see you back after to confirm the treatment plan.   Return as scheduled.    Thank you for choosing [facility] at [facility] to provide your oncology and hematology care.  To afford each patient quality time with our provider, please arrive at least 15 minutes before your scheduled appointment time.   If you have a lab appointment with the Cancer Center please come in thru the Main Entrance and check in at the main information desk.  You need to re-schedule your appointment should you arrive 10 or more minutes late.  We strive to give you quality time with our providers, and arriving late affects you and other patients whose appointments are after yours.  Also, if you no show three or more times for appointments you may be dismissed from the clinic at the providers discretion.     Again, thank you for choosing [facility].  Our hope is that these requests will decrease the amount of time that you wait before being seen by our physicians.       _____________________________________________________________  Should you have questions after your visit to [facility], please contact our office at [phone] and follow the prompts.  Our office hours are 8:00 a.m. and 4:30 p.m. Monday - Friday.  Please note that voicemails left after 4:00 p.m.  may not be returned until the following business day.  We are closed weekends and major holidays.  You do have access to a nurse 24-7, just call the main number to the clinic [phone] and do not press any options, hold on the line and a nurse will answer the phone.    For prescription refill requests, have your pharmacy contact our office and allow 72 hours.    Due to Covid, you will need to wear a mask upon entering the hospital. If you do not have a mask, a mask will be given to you at the Main Entrance upon arrival. For doctor visits, patients may have 1 support person age 75 or older with them. For treatment visits, patients can not have anyone with them due to social distancing guidelines and our immunocompromised population.

## 2023-01-31 ENCOUNTER — Encounter: Payer: Self-pay | Admitting: Internal Medicine

## 2023-01-31 DIAGNOSIS — R7303 Prediabetes: Secondary | ICD-10-CM | POA: Insufficient documentation

## 2023-01-31 NOTE — Assessment & Plan Note (Signed)
Lab Results  Component Value Date   HGBA1C 6.3 (H) 05/29/2022   Recent CMP showed blood glucose of 236, nonfasting Check CMP and HbA1c Follow DASH diet

## 2023-02-04 ENCOUNTER — Encounter: Payer: Self-pay | Admitting: *Deleted

## 2023-02-04 ENCOUNTER — Ambulatory Visit (HOSPITAL_COMMUNITY)
Admission: RE | Admit: 2023-02-04 | Discharge: 2023-02-04 | Disposition: A | Discharge status: Home / Self Care | Payer: PPO | Source: Ambulatory Visit | Attending: Hematology | Admitting: Hematology

## 2023-02-04 ENCOUNTER — Other Ambulatory Visit: Payer: Self-pay | Admitting: *Deleted

## 2023-02-04 DIAGNOSIS — C8300 Small cell B-cell lymphoma, unspecified site: Secondary | ICD-10-CM

## 2023-02-04 DIAGNOSIS — M7989 Other specified soft tissue disorders: Secondary | ICD-10-CM | POA: Diagnosis not present

## 2023-02-04 NOTE — Progress Notes (Signed)
Patient presents to the office with swelling of left lower leg.  No pain or redness present.  States that he has had pain in left groin, and lower abdomen, however denies other associated symptoms.  Per Dr. Ellin Saba, will order STAT US of lower extremity.  Patient aware.

## 2023-02-04 NOTE — Progress Notes (Signed)
Dr. Ellin Saba made aware of result and patient notified.

## 2023-02-06 ENCOUNTER — Encounter (HOSPITAL_COMMUNITY)
Admission: RE | Admit: 2023-02-06 | Discharge: 2023-02-06 | Disposition: A | Discharge status: Home / Self Care | Payer: PPO | Source: Ambulatory Visit | Attending: Hematology | Admitting: Hematology

## 2023-02-06 DIAGNOSIS — C8300 Small cell B-cell lymphoma, unspecified site: Secondary | ICD-10-CM | POA: Diagnosis not present

## 2023-02-06 DIAGNOSIS — R918 Other nonspecific abnormal finding of lung field: Secondary | ICD-10-CM | POA: Diagnosis not present

## 2023-02-06 DIAGNOSIS — C8592 Non-Hodgkin lymphoma, unspecified, intrathoracic lymph nodes: Secondary | ICD-10-CM | POA: Diagnosis not present

## 2023-02-06 MED ORDER — FLUDEOXYGLUCOSE F - 18 (FDG) INJECTION
13.3000 | Freq: Once | INTRAVENOUS | Status: AC | PRN
  Administered 2023-02-06: 13.3 via INTRAVENOUS

## 2023-02-10 ENCOUNTER — Other Ambulatory Visit: Payer: Self-pay | Admitting: *Deleted

## 2023-02-10 MED ORDER — TRAMADOL HCL 50 MG PO TABS: 50.0000 mg | ORAL_TABLET | Freq: Four times a day (QID) | ORAL | 0 refills | Status: DC | PRN

## 2023-02-10 NOTE — Progress Notes (Unsigned)
Patient called stating that he is experiencing pain to left abdomen and in left leg.  Per Dr. Ellin Saba, Tramadol 50 mg sent to pharmacy.  Patient made aware.

## 2023-02-11 ENCOUNTER — Other Ambulatory Visit: Payer: Self-pay

## 2023-02-11 DIAGNOSIS — C8300 Small cell B-cell lymphoma, unspecified site: Secondary | ICD-10-CM

## 2023-02-11 NOTE — Progress Notes (Signed)
[facility] [address], [place] [address]    Clinic Day:  02/12/23    Referring physician: Anabel Halon, MD  Patient Care Team: Anabel Halon, MD as PCP - General (Internal Medicine) Marykay Lex, MD as PCP - Cardiology (Cardiology) Doreatha Massed, MD as Consulting Physician (Oncology)   ASSESSMENT & PLAN:   Assessment: 1.  Stage IV small lymphocytic lymphoma: -CLL FISH panel normal, Ig HV positive, T p53 negative. -Ibrutinib 420 mg started on 12/01/2017, 6 cycles of rituximab from 12/14/2017 through 05/21/2018, ibrutinib discontinued on 01/30/2023 with progression 1.   2.  Health maintenance: - Colonoscopy on 10/10/2020 showed multiple polyps with no bleeding areas. - EGD on 10/10/2020 showed normal esophagus, few gastric polyps, gastric erosions with no stigmata of recent bleeding. - Push enteroscopy on 11/14/2020 did not reveal any bleeding issues.  Plan: 1.  Stage IV small lymphocytic lymphoma: - CT CAP on 01/24/2023 showed increased adenopathy above and below the diaphragm with new splenomegaly. - He stopped Ibrutinib about 4 days ago. - He developed suprapubic pain worse on standing up and better on sitting.  Rated between 2-10/10.  He is taking tramadol 50 mg every 6 hours.  It is helping. - For the last 2 to 3 days he has noticed sweating and also night sweats.  Reported decreased appetite. - Reviewed PET scan (02/06/2023): Left retroperitoneal nodal conglomerate measuring 9.2 x 6.5 cm with SUV 10.76.  Splenomegaly measuring 18 cm with SUV 3.67.  Large nodal mass in the left external iliac chain measures 11.3 x 6.9 cm, SUV 8.89. - He has hypotension with blood pressure 92/63. - I have recommended holding his blood pressure medication. - I have recommended that he be evaluated in the ER for hydration and pain control. - I have recommended biopsy of the left retroperitoneal nodal mass to see if there is any Richter's transformation. - I have  talked to ER attending Dr. Posey Rea who has kindly agreed to evaluate this patient. - I have also talked to hospitalist service to discuss the plan of arranging biopsy.  If he is stable tomorrow with pain and blood pressure issues, he may be discharged home and follow-up with me to discuss treatment plan based on biopsy.    2.  Mild thrombocytopenia: - This is from splenomegaly.  Will closely monitor.   3.  Normocytic anemia: - Continue iron tablet daily.  4.  Elevated LFTs and bilirubin: - Likely from lymphoma infiltration.    Orders Placed This Encounter  Procedures   CT Biopsy    Standing Status:   Future    Standing Expiration Date:   02/12/2024    Order Specific Question:   Lab orders requested (DO NOT place separate lab orders, these will be automatically ordered during procedure specimen collection):    Answer:   Surgical Pathology    Order Specific Question:   Reason for Exam (SYMPTOM  OR DIAGNOSIS REQUIRED)    Answer:   left retroperitoneal LN bx    Order Specific Question:   Preferred location?    Answer:   [facility]    Order Specific Question:   Release to patient    Answer:   Immediate      I,Helena R Teague,acting as a scribe for Doreatha Massed, MD.,have documented all relevant documentation on the behalf of Doreatha Massed, MD,as directed by  Doreatha Massed, MD while in the presence of Doreatha Massed, MD.  I, Doreatha Massed MD,  have reviewed the above documentation for accuracy and completeness, and I agree with the above.     Doreatha Massed, MD   7/31/20245:59 PM  CHIEF COMPLAINT:   Diagnosis: small lymphocytic lymphoma and symptomatic anemia    Cancer Staging  No matching staging information was found for the patient.    Prior Therapy: Rituximab monthly from 01/01/2018 to 05/21/2018   Current Therapy:  Ibrutinib 420 mg daily; intermittent Feraheme last on 09/29/2020    HISTORY OF PRESENT ILLNESS:   Oncology  History  Chronic lymphocytic leukemia (CLL), B-cell (HCC)  06/14/2015 Imaging   CT neck- Bulky adenopathy throughout the neck bilaterally. There also are enlarged parotid lymph nodes bilaterally. There is bilateral axillary adenopathy as well as mediastinal adenopathy. Findings are consistent with lymphoma. Biopsy recommended.   06/27/2015 Procedure   Left neck lymph node biopsy by Dr. Suszanne Conners   06/27/2015 Pathology Results   Diagnosis Lymph node for lymphoma, Left neck node for lymphoma work up - SMALL LYMPHOCYTIC LYMPHOMA.  LOW GRADE.   06/27/2015 Pathology Results   Tissue-Flow Cytometry - MONOCLONAL B CELL POPULATION IDENTIFIED. The phenotypic features are consistent with small lymphocytic lymphoma/chronic lymphocytic leukemia and correlate well with the morphology in the lymph node   Lymphoma, small lymphocytic (HCC)  10/14/2017 Initial Diagnosis   Lymphoma, small lymphocytic (HCC)   01/01/2018 - 05/21/2018 Chemotherapy   The patient had riTUXimab (RITUXAN) 900 mg in sodium chloride 0.9 % 250 mL (2.6471 mg/mL) infusion, 375 mg/m2 = 900 mg, Intravenous,  Once, 6 of 6 cycles Dose modification: 500 mg/m2 (original dose 500 mg/m2, Cycle 2, Reason: Provider Judgment) Administration: 900 mg (01/01/2018), 1,300 mg (01/29/2018), 1,300 mg (02/26/2018), 1,300 mg (03/26/2018), 1,300 mg (04/23/2018), 1,300 mg (05/21/2018)  for chemotherapy treatment.       INTERVAL HISTORY:   Adam Reyes is a 67 y.o. male presenting to clinic today for follow up of small lymphocytic lymphoma and symptomatic anemia. He was last seen by me on 01/30/23.  Since his last visit, he underwent restaging MRI from skull base to thigh on 7/25 that found: extensive tracer avid adenopathy within the chest, abdomen, and pelvis compatible with lymphoma and had a Deauville criteria 4/5; splenomegaly with diffuse increased uptake concerning for splenic involvement with a  Deauville criteria 4; and bilateral lung nodules are not significantly  tracer avid, however these lung nodules are too small to reliably characterize by PET-CT. He also underwent a venous US of the left lower extremity on 7/23 that found: no evidence of left lower extremity DVT and nonspecific finding of distended left popliteal vein with slow flow.   Today, he states that he is doing well overall. His appetite level is at 10%. His energy level is at 10%. He is accompanied by his wife.  He reports of a decreased appetite. He c/o profuse continuous sweating starting 2-3 days ago and feels lightheaded. He denies any fevers. He notes continuous night sweats for the last 2-3 days. He stopped taking Ibrutinib 4 days ago because he ran out of medication.  He notes he is in continuous pain that moves throughout the body and is currently in his left hip. The pain is usually in the suprapubic area, worsened upon standing and improved on sitting, typically a 2/10 severity, though it does wax and wane up to a 10/10 severity. His pain is currently at a 10/10 severity. He notes his Tramadol medication makes the pain tolerable and takes 1 pill q6h. He c/o nausea and dry heaves, occasionally noting  blood when heaving. He does not currently have nausea medication. He reports he cannot lie in bed because pain is unbearable and he has to sleep sitting in a chair. He notes he has urinary frequency.   PAST MEDICAL HISTORY:   Past Medical History: Past Medical History:  Diagnosis Date   Chronic lymphocytic leukemia (CLL), B-cell (HCC)    Small Cell Lymphoma   Coronary artery disease, non-occlusive 02/2017   Cardiac cath in setting of MI: 50 and 55% bifurcation LAD-Diag1   Enlarged lymph node    left neck   Hypertension    STEMI (ST elevation myocardial infarction) (HCC) 03/05/2017   hx/notes 03/05/2017 -likely aborted anterior STEMI with 50% bifurcation LAD-Diag1.  No PCI.  Preserved EF   Syncope due to orthostatic hypotension 04/21/2018    Surgical History: Past Surgical History:   Procedure Laterality Date   BIOPSY  10/10/2020   Procedure: BIOPSY;  Surgeon: Dolores Frame, MD;  [facility];  Service: Gastroenterology;;   BIOPSY  11/14/2020   Procedure: BIOPSY;  Surgeon: Dolores Frame, MD;  [facility];  Service: Gastroenterology;;   COLONOSCOPY N/A 10/11/2015   Procedure: COLONOSCOPY;  Surgeon: Malissa Hippo, MD;  [facility];  Service: Endoscopy;  Laterality: N/A;  10/11/2015   COLONOSCOPY WITH PROPOFOL N/A 10/10/2020   Procedure: COLONOSCOPY WITH PROPOFOL;  Surgeon: Dolores Frame, MD;  [facility];  Service: Gastroenterology;  Laterality: N/A;  AM   ENTEROSCOPY N/A 11/14/2020   Procedure: push enteroscopy;  Surgeon: Dolores Frame, MD;  [facility];  Service: Gastroenterology;  Laterality: N/A;   ESOPHAGOGASTRODUODENOSCOPY (EGD) WITH PROPOFOL N/A 10/10/2020   Procedure: ESOPHAGOGASTRODUODENOSCOPY (EGD) WITH PROPOFOL;  Surgeon: Dolores Frame, MD;  [facility];  Service: Gastroenterology;  Laterality: N/A;   ESOPHAGOGASTRODUODENOSCOPY (EGD) WITH PROPOFOL N/A 11/14/2020   Procedure: ESOPHAGOGASTRODUODENOSCOPY (EGD) WITH PROPOFOL;  Surgeon: Dolores Frame, MD;  [facility];  Service: Gastroenterology;  Laterality: N/A;  12:00   GIVENS CAPSULE STUDY N/A 11/07/2020   Procedure: GIVENS CAPSULE STUDY;  Surgeon: Dolores Frame, MD;  [facility]: [facility];  Service: Gastroenterology;  Laterality: N/A;  7:30 am   Graded Exercise Tolerance Test (GXT/ETT)  05/2017   10.7 METs (9: 25 min.  Reached 103% max predicted heart rate).  No EKG findings to suggest coronary ischemia.  Negative, low risk GXT   HEMOSTASIS CLIP PLACEMENT  11/14/2020   Procedure: HEMOSTASIS CLIP PLACEMENT;  Surgeon: Dolores Frame, MD;  [facility];  Service: Gastroenterology;;  small bowel nodule   LEFT HEART CATH AND CORONARY  ANGIOGRAPHY N/A 03/05/2017   Procedure: LEFT HEART CATH AND CORONARY ANGIOGRAPHY;  Surgeon: Marykay Lex, MD;  Location: [facility];  Service: Cardiovascular: pLAD-Diag1 50-55% (non-flow-limiiting).  EF ~50-55%.  although bifurcation lesion was presumed Culprit - no PCI (not flow limiting). - Med Rx.   MASS BIOPSY Left 06/27/2015   Procedure: OPEN LEFT NECK BIOPSY ;  Surgeon: Newman Pies, MD;  Location: [facility];  Service: ENT;  Laterality: Left;   POLYPECTOMY  10/10/2020   Procedure: POLYPECTOMY;  Surgeon: Dolores Frame, MD;  [facility];  Service: Gastroenterology;;   REFRACTIVE SURGERY Bilateral     Social History: Social History   Socioeconomic History   Marital status: Married    Spouse name: Not on file   Number of children: Not on file   Years of  education: Not on file   Highest education level: Not on file  Occupational History   Not on file  Tobacco Use   Smoking status: Former    Types: Cigarettes   Smokeless tobacco: Former    Types: Chew, Snuff   Tobacco comments:    03/06/2017 "quit smoking when I was young; quit chew/snuff in ~ 2008"  Vaping Use   Vaping status: Never Used  Substance and Sexual Activity   Alcohol use: Yes    Alcohol/week: 2.0 standard drinks of alcohol    Types: 2 Cans of beer per week    Comment: 2-3 drinks weekly   Drug use: No   Sexual activity: Yes  Other Topics Concern   Not on file  Social History Narrative   Married since 2001,third.Lives with wife.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   Social Determinants of Health   Financial Resource Strain: Low Risk  (05/16/2020)   Overall Financial Resource Strain (CARDIA)    Difficulty of Paying Living Expenses: Not hard at all  Food Insecurity: No Food Insecurity (02/12/2023)   Hunger Vital Sign    Worried About Running Out of Food in the Last Year: Never true    Ran Out of Food in the Last Year: Never true  Transportation Needs: No Transportation Needs  (02/12/2023)   PRAPARE - Administrator, Civil Service (Medical): No    Lack of Transportation (Non-Medical): No  Physical Activity: Sufficiently Active (05/16/2020)   Exercise Vital Sign    Days of Exercise per Week: 5 days    Minutes of Exercise per Session: 30 min  Stress: No Stress Concern Present (05/16/2020)   [employer] of Occupational Health - Occupational Stress Questionnaire    Feeling of Stress : Not at all  Social Connections: Moderately Integrated (05/16/2020)   Social Connection and Isolation Panel [NHANES]    Frequency of Communication with Friends and Family: More than three times a week    Frequency of Social Gatherings with Friends and Family: More than three times a week    Attends Religious Services: More than 4 times per year    Active Member of [employer] or Organizations: No    Attends Banker Meetings: Never    Marital Status: Married  Catering manager Violence: Not At Risk (02/12/2023)   Humiliation, Afraid, Rape, and Kick questionnaire    Fear of Current or Ex-Partner: No    Emotionally Abused: No    Physically Abused: No    Sexually Abused: No    Family History: Family History  Problem Relation Age of Onset   Diabetes Mother    Cancer Father    Cancer Brother     Current Medications: No current facility-administered medications for this visit. No current outpatient medications on file.  Facility-Administered Medications Ordered in Other Visits:    acetaminophen (TYLENOL) tablet 650 mg, 650 mg, Oral, Q6H PRN **OR** acetaminophen (TYLENOL) suppository 650 mg, 650 mg, Rectal, Q6H PRN, Johnson, Clanford L, MD   albuterol (PROVENTIL) (2.5 MG/3ML) 0.083% nebulizer solution 2.5 mg, 2.5 mg, Nebulization, Q2H PRN, Johnson, Clanford L, MD   atorvastatin (LIPITOR) tablet 40 mg, 40 mg, Oral, QPM, Johnson, Clanford L, MD   bisacodyl (DULCOLAX) EC tablet 5 mg, 5 mg, Oral, Daily PRN, Johnson, Clanford L, MD   fentaNYL (SUBLIMAZE) injection  12.5 mcg, 12.5 mcg, Intravenous, Q2H PRN, Johnson, Clanford L, MD   guaiFENesin (MUCINEX) 12 hr tablet 600 mg, 600 mg, Oral, BID PRN, Laural Benes, Clanford L, MD   HYDROcodone-acetaminophen (  NORCO/VICODIN) 5-325 MG per tablet 1 tablet, 1 tablet, Oral, Q4H PRN, Johnson, Clanford L, MD   lactated ringers infusion, , Intravenous, Continuous, Johnson, Clanford L, MD, Last Rate: 125 mL/hr at 02/12/23 1636, New Bag at 02/12/23 1636   metoprolol tartrate (LOPRESSOR) tablet 12.5 mg, 12.5 mg, Oral, BID, Johnson, Clanford L, MD   ondansetron (ZOFRAN) tablet 4 mg, 4 mg, Oral, Q6H PRN **OR** ondansetron (ZOFRAN) injection 4 mg, 4 mg, Intravenous, Q6H PRN, Johnson, Clanford L, MD   prochlorperazine (COMPAZINE) injection 10 mg, 10 mg, Intravenous, Q4H PRN, Johnson, Clanford L, MD   Allergies: No Known Allergies  REVIEW OF SYSTEMS:   Review of Systems  Constitutional:  Negative for chills, fatigue and fever.       +night sweats +profusely sweating  HENT:   Negative for lump/mass, mouth sores, nosebleeds, sore throat and trouble swallowing.   Eyes:  Negative for eye problems.  Respiratory:  Negative for cough and shortness of breath.   Cardiovascular:  Negative for chest pain, leg swelling and palpitations.  Gastrointestinal:  Positive for constipation and nausea. Negative for abdominal pain, diarrhea and vomiting.  Genitourinary:  Negative for bladder incontinence, difficulty urinating, dysuria, frequency, hematuria and nocturia.   Musculoskeletal:  Positive for arthralgias (left hip, 10/10 severity). Negative for back pain, flank pain, myalgias and neck pain.  Skin:  Negative for itching and rash.  Neurological:  Negative for dizziness, headaches and numbness.  Hematological:  Does not bruise/bleed easily.  Psychiatric/Behavioral:  Positive for sleep disturbance. Negative for depression and suicidal ideas. The patient is not nervous/anxious.   All other systems reviewed and are negative.    VITALS:    Blood pressure 92/63, pulse 80, temperature (!) 97.3 F (36.3 C), temperature source Oral, resp. rate 18, weight 268 lb (121.6 kg), SpO2 97%.  Wt Readings from Last 3 Encounters:  02/12/23 268 lb 1.3 oz (121.6 kg)  02/12/23 268 lb (121.6 kg)  01/30/23 270 lb (122.5 kg)    Body mass index is 32.62 kg/m.  Performance status (ECOG): 1 - Symptomatic but completely ambulatory  PHYSICAL EXAM:   Physical Exam Vitals and nursing note reviewed. Exam conducted with a chaperone present.  Constitutional:      Appearance: Normal appearance.  Cardiovascular:     Rate and Rhythm: Normal rate and regular rhythm.     Pulses: Normal pulses.     Heart sounds: Normal heart sounds.  Pulmonary:     Effort: Pulmonary effort is normal.     Breath sounds: Normal breath sounds.  Abdominal:     Palpations: Abdomen is soft. There is no hepatomegaly, splenomegaly or mass.     Tenderness: There is no abdominal tenderness.  Musculoskeletal:     Right lower leg: No edema.     Left lower leg: No edema.  Lymphadenopathy:     Cervical: No cervical adenopathy.     Right cervical: No superficial, deep or posterior cervical adenopathy.    Left cervical: No superficial, deep or posterior cervical adenopathy.     Upper Body:     Right upper body: No supraclavicular or axillary adenopathy.     Left upper body: No supraclavicular or axillary adenopathy.  Neurological:     General: No focal deficit present.     Mental Status: He is alert and oriented to person, place, and time.  Psychiatric:        Mood and Affect: Mood normal.        Behavior: Behavior normal.  LABS:      Latest Ref Rng & Units 02/12/2023   10:54 AM 02/12/2023    9:19 AM 01/23/2023   10:11 AM  CBC  WBC 4.0 - 10.5 K/uL 7.5  7.6  6.9   Hemoglobin 13.0 - 17.0 g/dL 40.9  81.1  91.4   Hematocrit 39.0 - 52.0 % 37.9  41.9  41.8   Platelets 150 - 400 K/uL 149  157  120       Latest Ref Rng & Units 02/12/2023   10:54 AM 02/12/2023     9:19 AM 01/23/2023   10:11 AM  CMP  Glucose 70 - 99 mg/dL 782  956  213   BUN 8 - 23 mg/dL 25  24  29    Creatinine 0.61 - 1.24 mg/dL 0.86  5.78  4.69   Sodium 135 - 145 mmol/L 131  133  135   Potassium 3.5 - 5.1 mmol/L 3.5  3.6  3.8   Chloride 98 - 111 mmol/L 96  96  104   CO2 22 - 32 mmol/L 22  22  24    Calcium 8.9 - 10.3 mg/dL 8.9  9.4  8.9   Total Protein 6.5 - 8.1 g/dL 5.8  6.6  6.4   Total Bilirubin 0.3 - 1.2 mg/dL 3.7  4.6  1.8   Alkaline Phos 38 - 126 U/L 96  107  62   AST 15 - 41 U/L 45  49  21   ALT 0 - 44 U/L 45  49  21      No results found for: "CEA1", "CEA" / No results found for: "CEA1", "CEA" No results found for: "PSA1" No results found for: "CAN199" No results found for: "CAN125"  Lab Results  Component Value Date   TOTALPROTELP 6.5 07/11/2015   TOTALPROTELP 6.4 07/11/2015   ALBUMINELP 3.7 07/11/2015   A1GS 0.3 07/11/2015   A2GS 0.5 07/11/2015   BETS 1.0 07/11/2015   GAMS 1.0 07/11/2015   MSPIKE 0.4 (H) 07/11/2015   SPEI Comment 07/11/2015   Lab Results  Component Value Date   TIBC 321 01/23/2023   TIBC 341 09/16/2022   TIBC 375 04/11/2022   FERRITIN 85 01/23/2023   FERRITIN 41 09/16/2022   FERRITIN 41 04/11/2022   IRONPCTSAT 28 01/23/2023   IRONPCTSAT 28 09/16/2022   IRONPCTSAT 34 04/11/2022   Lab Results  Component Value Date   LDH 159 01/23/2023   LDH 142 09/16/2022   LDH 168 04/11/2022     STUDIES:   CT ABDOMEN PELVIS W CONTRAST  Result Date: 02/12/2023 CLINICAL DATA:  History of small lymphocytic lymphoma with progression on recent PET-CT. Right lower quadrant abdominal pain. 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 * Tracking Code: BO * EXAM: CT ABDOMEN AND PELVIS WITH CONTRAST TECHNIQUE: Multidetector CT imaging of the abdomen and pelvis was performed using the standard protocol following bolus administration of intravenous contrast. RADIATION DOSE REDUCTION: This exam was performed according to the departmental dose-optimization program  which includes automated exposure control, adjustment of the mA and/or kV according to patient size and/or use of iterative reconstruction technique. CONTRAST:  OMNIPAQUE IOHEXOL 300 MG/ML  SOLN COMPARISON:  PET-CT 02/06/2023. CT of the chest, abdomen and pelvis 01/24/2023 and 04/11/2022. FINDINGS: Lower chest: Stable linear atelectasis or scarring at both lung bases. Small pulmonary nodules bilaterally are unchanged from recent prior imaging, measuring up to 8 mm in the right lower lobe and 9 mm in the left lower lobe, both on image 9/5. There  is progressive subcarinal and distal paraesophageal adenopathy as seen on recent PET-CT. A distal right paraesophageal node measures up to 5.4 x 4.6 cm on image 14/2. Hepatobiliary: The liver is normal in density without suspicious focal abnormality. No evidence of gallstones, gallbladder wall thickening or biliary dilatation. Pancreas: Unremarkable. No pancreatic ductal dilatation or surrounding inflammatory changes. Spleen: Progressive splenomegaly compared with recent CT. No focal abnormality identified. Adrenals/Urinary Tract: Both adrenal glands appear normal. No evidence of urinary tract calculus, suspicious renal lesion or hydronephrosis. Unchanged cyst in the mid left kidney for which no follow-up imaging is recommended. Stable bladder compression by the bilateral pelvic lymphadenopathy. No intrinsic bladder abnormality identified. Stomach/Bowel: No enteric contrast administered. The stomach appears unremarkable for its degree of distension. No evidence of bowel wall thickening, distention or surrounding inflammatory change. Vascular/Lymphatic: Again demonstrated is extensive retroperitoneal, pelvic and bilateral inguinal adenopathy. No significant change is seen from the recent PET-CT, although some of the lymph nodes have enlarged from the diagnostic CT done 01/24/2023. For example, there is a left external iliac node measuring 4.5 cm short axis on image 86/2  which previously measured 3.7 cm. A presacral node measuring 3.1 cm on image 72/2 previously measured 2.3 cm. No nodal necrosis identified. Mild aortic and branch vessel atherosclerosis without evidence of aneurysm or large vessel occlusion. Reproductive: The prostate gland appears mildly enlarged, but stable. Other: No evidence of abdominal wall mass or hernia. No ascites or pneumoperitoneum. Musculoskeletal: No acute or significant osseous findings. There is multilevel spondylosis with a grade 1 anterolisthesis at L5-S1 secondary to underlying pars defects. Associated prominent foraminal narrowing in the lower lumbar spine, unchanged. Unless specific follow-up recommendations are mentioned in the findings or impression sections, no imaging follow-up of any mentioned incidental findings is recommended. IMPRESSION: 1. Known extensive adenopathy in the lower chest, abdomen and pelvis has mildly progressed compared with the CTs of less than 3 weeks earlier consistent with progressive lymphoma. 2. Progressive splenomegaly. 3. Stable small pulmonary nodules at both lung bases. 4. No definite acute findings. 5. 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 Associated prominent foraminal narrowing in the lower lumbar spine. 6.  Aortic Atherosclerosis (ICD10-I70.0). Electronically Signed   By: Carey Bullocks M.D.   On: 02/12/2023 12:16   DG Chest Port 1 View  Result Date: 02/12/2023 CLINICAL DATA:  Sepsis.  History of lymphoma EXAM: PORTABLE CHEST 1 VIEW COMPARISON:  PET-CT 02/06/2023 FINDINGS: Subtle bandlike opacity right lung base. Atelectasis is favored. No consolidation, pneumothorax or effusion. No edema. Normal cardiopericardial silhouette. Overlapping cardiac leads. IMPRESSION: Right basilar atelectasis.  No pneumothorax or effusion. Electronically Signed   By: Karen Kays M.D.   On: 02/12/2023 11:29   NM PET Image Restag (PS) Skull Base To  Thigh  Result Date: 02/12/2023 CLINICAL DATA:  Subsequent treatment strategy for small lymphocytic lymphoma. EXAM: NUCLEAR MEDICINE PET SKULL BASE TO THIGH TECHNIQUE: 13.3 mCi F-18 FDG was injected intravenously. Full-ring PET imaging was performed from the skull base to thigh after the radiotracer. CT data was obtained and used for attenuation correction and anatomic localization. Fasting blood glucose: 105 mg/dl COMPARISON:  CT 1/61/09 FINDINGS: Mediastinal blood pool activity: SUV max 2.51 Liver activity: SUV max 3.29 NECK: Bilateral cervical adenopathy is identified with corresponding mild increased uptake. -right level 2 node measures 1.6 cm with SUV max of 2.75. -Left level 3 lymph node measures 1.3 cm with SUV max of 2.69, image 59/202. Incidental CT findings: None. CHEST:  Left superior mediastinal prevascular lymph node measures 1.7 cm with SUV max of 3.93, image 86/202. Multiple tracer avid posterior mediastinal lymph nodes are identified. -aorto esophageal lymph node measures 1.8 cm with SUV max of 3.99. -Right posterior mediastinal node just above the GE junction measures 3.2 cm with SUV max of 4.71, image 148/202. -Left lower posterior mediastinal node measures 3.4 cm with SUV max 4.59, image 148/202. Bilateral retropectoral adenopathy is identified which exhibits mild tracer uptake. -Right retropectoral lymph node measures 1.5 cm with SUV max of 1.82, image 90/202. -Left retropectoral lymph node measures 1.4 cm with SUV max of 1.98, image 94/202. Small bilateral lung nodules are again noted. -Index nodule within the anterior left upper lobe measures 7 mm with SUV max of 0.89, image 88/202. -1 cm nodule within the basilar right upper lobe has an SUV max of 0.71, image 98/202. Incidental CT findings: Aortic atherosclerosis and coronary artery calcifications. ABDOMEN/PELVIS: There is extensive tracer avid abdominopelvic adenopathy. -left retrocrural lymph node measures 2.4 cm with SUV max of 5.19, image  165/202. -Left retroperitoneal nodal conglomeration measures 9.2 x 6.5 cm with SUV max of 10.76, image 200/202. -Right common iliac lymph node measures 1.9 cm with SUV max of 4.65, image 238/202 -Left inguinal lymph node measures 1.7 cm with SUV max of 3.07, image 286/202. -Large nodal conglomeration within the left external iliac chain measures 11.3 x 6.9 cm with an SUV max 8.89, image 260/202. Diffuse increased uptake within the spleen has an SUV max 3.67. The spleen is enlarged measuring 18.09 cm in cranial caudal dimension. No abnormal tracer uptake identified within the liver, pancreas, or adrenal glands. Incidental CT findings: Left kidney cyst measures 6.3 cm, image 190/202. Aortic atherosclerotic calcifications. SKELETON: No focal hypermetabolic activity to suggest skeletal metastasis. Incidental CT findings: None. IMPRESSION: 1. Extensive tracer avid adenopathy within the chest, abdomen, and pelvis compatible with lymphoma. Deauville criteria 4/5. 2. Splenomegaly with diffuse increased uptake concerning for splenic involvement. Deauville criteria 4 3. Bilateral lung nodules are not significantly tracer avid. However these lung nodules are too small to reliably characterize by PET-CT. 4. Aortic Atherosclerosis (ICD10-I70.0). Electronically Signed   By: Signa Kell M.D.   On: 02/12/2023 09:46   US Venous Img Lower Unilateral Left  Result Date: 02/04/2023 CLINICAL DATA:  Swelling left leg EXAM: Left LOWER EXTREMITY VENOUS DOPPLER ULTRASOUND TECHNIQUE: Gray-scale sonography with compression, as well as color and duplex ultrasound, were performed to evaluate the deep venous system(s) from the level of the common femoral vein through the popliteal and proximal calf veins. COMPARISON:  None Available. FINDINGS: VENOUS Normal compressibility of the common femoral, superficial femoral, and popliteal veins, as well as the visualized calf veins. Visualized portions of profunda femoral vein and great saphenous  vein unremarkable. No filling defects to suggest DVT on grayscale or color Doppler imaging. Doppler waveforms show normal direction of venous flow, normal respiratory plasticity and response to augmentation. Limited views of the contralateral common femoral vein are unremarkable. OTHER Of note there is some slow flow in the left popliteal vein, nonspecific. This vein also is distended. Limitations: none IMPRESSION: No evidence of left lower extremity DVT. Nonspecific finding of distended left popliteal vein with slow flow Electronically Signed   By: Karen Kays M.D.   On: 02/04/2023 10:15   CT SOFT TISSUE NECK W CONTRAST  Result Date: 01/27/2023 CLINICAL DATA:  Lymphoma, ongoing chemo. EXAM: CT NECK WITH CONTRAST TECHNIQUE: Multidetector CT imaging of the neck was performed using the standard  protocol following the bolus administration of intravenous contrast. RADIATION DOSE REDUCTION: This exam was performed according to the departmental dose-optimization program which includes automated exposure control, adjustment of the mA and/or kV according to patient size and/or use of iterative reconstruction technique. CONTRAST:  OMNIPAQUE IOHEXOL 300 MG/ML  SOLN COMPARISON:  CT neck 04/11/2022 FINDINGS: Pharynx and larynx: The nasal cavity and nasopharynx are unremarkable. The oral cavity and oropharynx are unremarkable. The parapharyngeal spaces are clear. The hypopharynx and larynx are unremarkable. The vocal folds are normal in appearance. The epiglottis is normal. There is no retropharyngeal collection. The airway is patent. Salivary glands: The parotid and submandibular glands are unremarkable. Thyroid: Unremarkable. Lymph nodes: Multiple enlarged lymph nodes are again seen: *The 2.2 cm short axis right level IB lymph node is increased in bulk in the coronal plane (4-43, 2-57). *1.8 cm short axis right level IIA lymph node is slightly increased in size from 1.4 cm (2-48). *1.5 cm short axis right level IIB  lymph node more posteriorly is increased in size from 1.1 cm (2-41). *Multiple level III and IV lymph nodes are also overall slightly increased in size. *1.0 cm left level IIA lymph node is slightly increased in size from 0.9 cm (2-49). *The 1.4 cm short axis left level IIA/III node is increased in size from 1.0 cm (2-64). 1.3 cm left level III node is similar in size (2-69). 1.2 cm left level IV node is stable to slightly increased in size (2-81). *Partially imaged bilateral subpectoral lymphadenopathy appears increased. Vascular: The major vasculature of the neck is unremarkable. Limited intracranial: Unremarkable. Visualized orbits: Bilateral lens implants are in place. The imaged globes and orbits are otherwise unremarkable. Mastoids and visualized paranasal sinuses: There is mild mucosal thickening in the left maxillary sinus. The mastoid air cells and middle ear cavities are clear. There is probable cerumen in both external auditory canals. Skeleton: There is no acute osseous abnormality or suspicious osseous lesion. Upper chest: Reported separately. Other: The previously seen calcified inclusion cyst in the left neck is decreased in size. IMPRESSION: Since the CT neck from 04/11/2022, bilateral cervical lymphadenopathy has worsened, concerning for disease progression. Electronically Signed   By: Lesia Hausen M.D.   On: 01/27/2023 10:34   CT CHEST ABDOMEN PELVIS W CONTRAST  Result Date: 01/24/2023 CLINICAL DATA:  History of lymphoma, monitor. * Tracking Code: BO *. EXAM: CT CHEST, ABDOMEN, AND PELVIS WITH CONTRAST TECHNIQUE: Multidetector CT imaging of the chest, abdomen and pelvis was performed following the standard protocol during bolus administration of intravenous contrast. RADIATION DOSE REDUCTION: This exam was performed according to the departmental dose-optimization program which includes automated exposure control, adjustment of the mA and/or kV according to patient size and/or use of iterative  reconstruction technique. CONTRAST:  OMNIPAQUE IOHEXOL 300 MG/ML  SOLN COMPARISON:  Multiple priors including most recent CT April 11, 2022. FINDINGS: CT CHEST FINDINGS Cardiovascular: Normal caliber thoracic aorta. No central pulmonary embolus on this nondedicated study. Coronary artery calcifications. Normal size heart. No significant pericardial effusion/thickening. Mediastinum/Nodes: Increased size of thoracic inlet, mediastinal, axillary and subpectoral lymph nodes. For reference: -thoracic inlet lymph node measures 15 mm in short axis on image 11/2 previously 7 mm. -right subpectoral lymph node measures 13 mm in short axis on image 15/2, previously 6 mm. -paraesophageal lymph node measures 16 mm in short axis on image 25/2 previously 5 mm. No suspicious thyroid nodule. The esophagus is grossly unremarkable. Lungs/Pleura: Increased size of some of the scattered bilateral pulmonary  nodules with other stable in size from prior. For reference: -right upper lobe perifissural pulmonary nodule measures 10 mm on image 84/3 previously 7 mm -right upper lobe perifissural pulmonary nodule measures 10 mm on image 88/3 previously 5 mm. -left lower lobe subpleural pulmonary nodule measures 6 mm on image 112/3 previously 4 mm. -right lower lobe pulmonary nodule measures 7 mm on image 108/3, unchanged. No pleural effusion.  No pneumothorax. Musculoskeletal: No aggressive lytic or blastic lesion of bone. CT ABDOMEN PELVIS FINDINGS Hepatobiliary: No suspicious hepatic lesion. Gallbladder is unremarkable. No biliary ductal dilation. Pancreas: No pancreatic ductal dilation or evidence of acute inflammation. Spleen: Splenomegaly measuring 18.4 cm in maximum axial dimension. Adrenals/Urinary Tract: Bilateral adrenal glands appear normal. Fluid signal 6.7 cm left upper pole renal cyst. Kidneys demonstrate symmetric enhancement. Urinary bladder is unremarkable for degree of distension. Stomach/Bowel: No pathologic dilation  of small or large bowel. Colonic diverticulosis without findings of acute diverticulitis. No evidence of acute bowel inflammation. Vascular/Lymphatic: Increased size and number of the retrocrural, retroperitoneal, right upper quadrant, left-greater-than-right iliac side chain and inguinal lymph nodes. For reference: -left retrocrural lymph node measures 2.2 cm in short axis on image 59/2 previously 1.6 cm -left periaortic lymph node conglomerate measures 6.5 cm in short axis on image 81/2 -left external iliac lymph node measures 3.6 cm in short axis on image 121/2 previously 1.7 cm. Aortic atherosclerosis. Effacement of the abdominal aorta by retroperitoneal adenopathy. The portal, splenic and superior mesenteric veins are patent. Reproductive: Prostate gland is unremarkable. Other: Small fat containing right inguinal hernia. Musculoskeletal: Chronic bilateral L5 pars defects. No aggressive lytic or blastic lesion of bone. Multilevel degenerative change of the spine. IMPRESSION: 1. Increased adenopathy above and below the diaphragm with new splenomegaly compatible with worsening lymphomatous disease. 2. Increased size of some of the scattered bilateral pulmonary nodules, suggestive of lymphomatous disease involvement. 3.  Aortic Atherosclerosis (ICD10-I70.0). These results will be called to the ordering clinician or representative by the Radiologist Assistant, and communication documented in the PACS or [employer]. Electronically Signed   By: Maudry Mayhew M.D.   On: 01/24/2023 15:57

## 2023-02-12 ENCOUNTER — Encounter: Payer: Self-pay | Admitting: General Practice

## 2023-02-12 ENCOUNTER — Inpatient Hospital Stay: Payer: PPO

## 2023-02-12 ENCOUNTER — Inpatient Hospital Stay: Payer: PPO | Admitting: Hematology

## 2023-02-12 ENCOUNTER — Inpatient Hospital Stay (HOSPITAL_COMMUNITY)
Admission: EM | Admit: 2023-02-12 | Discharge: 2023-02-18 | DRG: 674 | Disposition: A | Discharge status: Home / Self Care | Payer: PPO | Source: Ambulatory Visit | Attending: Student | Admitting: Student

## 2023-02-12 ENCOUNTER — Emergency Department (HOSPITAL_COMMUNITY): Payer: PPO

## 2023-02-12 ENCOUNTER — Other Ambulatory Visit: Payer: Self-pay

## 2023-02-12 ENCOUNTER — Encounter (HOSPITAL_COMMUNITY): Payer: Self-pay | Admitting: *Deleted

## 2023-02-12 VITALS — BP 92/63 | HR 80 | Temp 97.3°F | Resp 18 | Wt 268.0 lb

## 2023-02-12 DIAGNOSIS — R5383 Other fatigue: Secondary | ICD-10-CM | POA: Diagnosis present

## 2023-02-12 DIAGNOSIS — I351 Nonrheumatic aortic (valve) insufficiency: Secondary | ICD-10-CM | POA: Diagnosis present

## 2023-02-12 DIAGNOSIS — I959 Hypotension, unspecified: Secondary | ICD-10-CM | POA: Diagnosis present

## 2023-02-12 DIAGNOSIS — Z7901 Long term (current) use of anticoagulants: Secondary | ICD-10-CM

## 2023-02-12 DIAGNOSIS — N179 Acute kidney failure, unspecified: Secondary | ICD-10-CM | POA: Diagnosis not present

## 2023-02-12 DIAGNOSIS — C9110 Chronic lymphocytic leukemia of B-cell type not having achieved remission: Secondary | ICD-10-CM | POA: Diagnosis not present

## 2023-02-12 DIAGNOSIS — I4891 Unspecified atrial fibrillation: Secondary | ICD-10-CM | POA: Diagnosis present

## 2023-02-12 DIAGNOSIS — D6959 Other secondary thrombocytopenia: Secondary | ICD-10-CM | POA: Diagnosis present

## 2023-02-12 DIAGNOSIS — E785 Hyperlipidemia, unspecified: Secondary | ICD-10-CM | POA: Diagnosis present

## 2023-02-12 DIAGNOSIS — N281 Cyst of kidney, acquired: Secondary | ICD-10-CM | POA: Diagnosis not present

## 2023-02-12 DIAGNOSIS — R0989 Other specified symptoms and signs involving the circulatory and respiratory systems: Secondary | ICD-10-CM

## 2023-02-12 DIAGNOSIS — C8300 Small cell B-cell lymphoma, unspecified site: Secondary | ICD-10-CM | POA: Diagnosis present

## 2023-02-12 DIAGNOSIS — I4819 Other persistent atrial fibrillation: Secondary | ICD-10-CM | POA: Diagnosis present

## 2023-02-12 DIAGNOSIS — I493 Ventricular premature depolarization: Secondary | ICD-10-CM | POA: Diagnosis present

## 2023-02-12 DIAGNOSIS — M79606 Pain in leg, unspecified: Secondary | ICD-10-CM | POA: Diagnosis not present

## 2023-02-12 DIAGNOSIS — Z87891 Personal history of nicotine dependence: Secondary | ICD-10-CM

## 2023-02-12 DIAGNOSIS — E222 Syndrome of inappropriate secretion of antidiuretic hormone: Secondary | ICD-10-CM | POA: Diagnosis present

## 2023-02-12 DIAGNOSIS — E876 Hypokalemia: Secondary | ICD-10-CM | POA: Diagnosis present

## 2023-02-12 DIAGNOSIS — J9811 Atelectasis: Secondary | ICD-10-CM | POA: Diagnosis not present

## 2023-02-12 DIAGNOSIS — I25119 Atherosclerotic heart disease of native coronary artery with unspecified angina pectoris: Secondary | ICD-10-CM | POA: Diagnosis present

## 2023-02-12 DIAGNOSIS — G8929 Other chronic pain: Secondary | ICD-10-CM | POA: Diagnosis present

## 2023-02-12 DIAGNOSIS — E872 Acidosis, unspecified: Secondary | ICD-10-CM | POA: Diagnosis present

## 2023-02-12 DIAGNOSIS — R161 Splenomegaly, not elsewhere classified: Secondary | ICD-10-CM | POA: Diagnosis present

## 2023-02-12 DIAGNOSIS — G479 Sleep disorder, unspecified: Secondary | ICD-10-CM | POA: Diagnosis not present

## 2023-02-12 DIAGNOSIS — C8590 Non-Hodgkin lymphoma, unspecified, unspecified site: Secondary | ICD-10-CM | POA: Diagnosis present

## 2023-02-12 DIAGNOSIS — D849 Immunodeficiency, unspecified: Secondary | ICD-10-CM | POA: Diagnosis present

## 2023-02-12 DIAGNOSIS — I471 Supraventricular tachycardia, unspecified: Secondary | ICD-10-CM | POA: Diagnosis present

## 2023-02-12 DIAGNOSIS — I252 Old myocardial infarction: Secondary | ICD-10-CM

## 2023-02-12 DIAGNOSIS — I2510 Atherosclerotic heart disease of native coronary artery without angina pectoris: Secondary | ICD-10-CM | POA: Diagnosis present

## 2023-02-12 DIAGNOSIS — R55 Syncope and collapse: Principal | ICD-10-CM

## 2023-02-12 DIAGNOSIS — Z539 Procedure and treatment not carried out, unspecified reason: Secondary | ICD-10-CM | POA: Diagnosis not present

## 2023-02-12 DIAGNOSIS — R630 Anorexia: Secondary | ICD-10-CM | POA: Diagnosis present

## 2023-02-12 DIAGNOSIS — Z6833 Body mass index (BMI) 33.0-33.9, adult: Secondary | ICD-10-CM

## 2023-02-12 DIAGNOSIS — I9589 Other hypotension: Secondary | ICD-10-CM | POA: Diagnosis present

## 2023-02-12 DIAGNOSIS — R590 Localized enlarged lymph nodes: Secondary | ICD-10-CM | POA: Diagnosis not present

## 2023-02-12 DIAGNOSIS — E611 Iron deficiency: Secondary | ICD-10-CM | POA: Diagnosis present

## 2023-02-12 DIAGNOSIS — R7989 Other specified abnormal findings of blood chemistry: Secondary | ICD-10-CM | POA: Diagnosis not present

## 2023-02-12 DIAGNOSIS — Z79899 Other long term (current) drug therapy: Secondary | ICD-10-CM

## 2023-02-12 DIAGNOSIS — E871 Hypo-osmolality and hyponatremia: Secondary | ICD-10-CM | POA: Diagnosis not present

## 2023-02-12 DIAGNOSIS — I10 Essential (primary) hypertension: Secondary | ICD-10-CM | POA: Diagnosis present

## 2023-02-12 DIAGNOSIS — Z7982 Long term (current) use of aspirin: Secondary | ICD-10-CM

## 2023-02-12 DIAGNOSIS — R739 Hyperglycemia, unspecified: Secondary | ICD-10-CM | POA: Diagnosis present

## 2023-02-12 DIAGNOSIS — Z8572 Personal history of non-Hodgkin lymphomas: Secondary | ICD-10-CM | POA: Diagnosis not present

## 2023-02-12 DIAGNOSIS — N400 Enlarged prostate without lower urinary tract symptoms: Secondary | ICD-10-CM | POA: Diagnosis not present

## 2023-02-12 DIAGNOSIS — E878 Other disorders of electrolyte and fluid balance, not elsewhere classified: Secondary | ICD-10-CM | POA: Diagnosis present

## 2023-02-12 DIAGNOSIS — E669 Obesity, unspecified: Secondary | ICD-10-CM | POA: Diagnosis present

## 2023-02-12 DIAGNOSIS — R531 Weakness: Secondary | ICD-10-CM | POA: Diagnosis not present

## 2023-02-12 DIAGNOSIS — Z833 Family history of diabetes mellitus: Secondary | ICD-10-CM

## 2023-02-12 DIAGNOSIS — D630 Anemia in neoplastic disease: Secondary | ICD-10-CM | POA: Diagnosis present

## 2023-02-12 DIAGNOSIS — R1031 Right lower quadrant pain: Secondary | ICD-10-CM | POA: Diagnosis not present

## 2023-02-12 DIAGNOSIS — R748 Abnormal levels of other serum enzymes: Secondary | ICD-10-CM | POA: Diagnosis present

## 2023-02-12 DIAGNOSIS — E860 Dehydration: Secondary | ICD-10-CM | POA: Diagnosis present

## 2023-02-12 DIAGNOSIS — N390 Urinary tract infection, site not specified: Secondary | ICD-10-CM | POA: Diagnosis present

## 2023-02-12 HISTORY — DX: Essential (primary) hypertension: I10

## 2023-02-12 LAB — COMPREHENSIVE METABOLIC PANEL
ALT: 49 U/L — ABNORMAL HIGH (ref 0–44)
ALT: 45 U/L — ABNORMAL HIGH (ref 0–44)
AST: 49 U/L — ABNORMAL HIGH (ref 15–41)
AST: 45 U/L — ABNORMAL HIGH (ref 15–41)
Albumin: 3.9 g/dL (ref 3.5–5.0)
Albumin: 3.4 g/dL — ABNORMAL LOW (ref 3.5–5.0)
Alkaline Phosphatase: 107 U/L (ref 38–126)
Alkaline Phosphatase: 96 U/L (ref 38–126)
Anion gap: 15 (ref 5–15)
Anion gap: 13 (ref 5–15)
BUN: 24 mg/dL — ABNORMAL HIGH (ref 8–23)
BUN: 25 mg/dL — ABNORMAL HIGH (ref 8–23)
CO2: 22 mmol/L (ref 22–32)
CO2: 22 mmol/L (ref 22–32)
Calcium: 9.4 mg/dL (ref 8.9–10.3)
Calcium: 8.9 mg/dL (ref 8.9–10.3)
Chloride: 96 mmol/L — ABNORMAL LOW (ref 98–111)
Chloride: 96 mmol/L — ABNORMAL LOW (ref 98–111)
Creatinine, Ser: 1.3 mg/dL — ABNORMAL HIGH (ref 0.61–1.24)
Creatinine, Ser: 1.48 mg/dL — ABNORMAL HIGH (ref 0.61–1.24)
GFR, Estimated: >60 mL/min (ref >60)
GFR, Estimated: 52 mL/min — ABNORMAL LOW (ref >60)
Glucose, Bld: 118 mg/dL — ABNORMAL HIGH (ref 70–99)
Glucose, Bld: 114 mg/dL — ABNORMAL HIGH (ref 70–99)
Potassium: 3.6 mmol/L (ref 3.5–5.1)
Potassium: 3.5 mmol/L (ref 3.5–5.1)
Sodium: 133 mmol/L — ABNORMAL LOW (ref 135–145)
Sodium: 131 mmol/L — ABNORMAL LOW (ref 135–145)
Total Bilirubin: 4.6 mg/dL — ABNORMAL HIGH (ref 0.3–1.2)
Total Bilirubin: 3.7 mg/dL — ABNORMAL HIGH (ref 0.3–1.2)
Total Protein: 6.6 g/dL (ref 6.5–8.1)
Total Protein: 5.8 g/dL — ABNORMAL LOW (ref 6.5–8.1)

## 2023-02-12 LAB — CULTURE, BLOOD (ROUTINE X 2): Special Requests: ADEQUATE

## 2023-02-12 LAB — CBC WITH DIFFERENTIAL/PLATELET
Abs Immature Granulocytes: 0.09 10*3/uL — ABNORMAL HIGH (ref 0.00–0.07)
Abs Immature Granulocytes: 0 10*3/uL (ref 0.00–0.07)
Band Neutrophils: 1 %
Basophils Absolute: 0.1 10*3/uL (ref 0.0–0.1)
Basophils Absolute: 0.2 10*3/uL — ABNORMAL HIGH (ref 0.0–0.1)
Basophils Relative: 1 %
Basophils Relative: 2 %
Eosinophils Absolute: 0.3 10*3/uL (ref 0.0–0.5)
Eosinophils Absolute: 0.2 10*3/uL (ref 0.0–0.5)
Eosinophils Relative: 4 %
Eosinophils Relative: 2 %
HCT: 41.9 % (ref 39.0–52.0)
HCT: 37.9 % — ABNORMAL LOW (ref 39.0–52.0)
Hemoglobin: 14 g/dL (ref 13.0–17.0)
Hemoglobin: 12.8 g/dL — ABNORMAL LOW (ref 13.0–17.0)
Immature Granulocytes: 1 %
Lymphocytes Relative: 17 %
Lymphocytes Relative: 14 %
Lymphs Abs: 1.3 10*3/uL (ref 0.7–4.0)
Lymphs Abs: 1.1 10*3/uL (ref 0.7–4.0)
MCH: 32.6 pg (ref 26.0–34.0)
MCH: 32.7 pg (ref 26.0–34.0)
MCHC: 33.4 g/dL (ref 30.0–36.0)
MCHC: 33.8 g/dL (ref 30.0–36.0)
MCV: 97.4 fL (ref 80.0–100.0)
MCV: 96.9 fL (ref 80.0–100.0)
Monocytes Absolute: 0.8 10*3/uL (ref 0.1–1.0)
Monocytes Absolute: 0.5 10*3/uL (ref 0.1–1.0)
Monocytes Relative: 10 %
Monocytes Relative: 6 %
Neutro Abs: 5.1 10*3/uL (ref 1.7–7.7)
Neutro Abs: 5.7 10*3/uL (ref 1.7–7.7)
Neutrophils Relative %: 67 %
Neutrophils Relative %: 75 %
Platelets: 157 10*3/uL (ref 150–400)
Platelets: 149 10*3/uL — ABNORMAL LOW (ref 150–400)
RBC: 4.3 MIL/uL (ref 4.22–5.81)
RBC: 3.91 MIL/uL — ABNORMAL LOW (ref 4.22–5.81)
RDW: 14.1 % (ref 11.5–15.5)
RDW: 14.3 % (ref 11.5–15.5)
WBC: 7.6 10*3/uL (ref 4.0–10.5)
WBC: 7.5 10*3/uL (ref 4.0–10.5)
nRBC: 0 % (ref 0.0–0.2)
nRBC: 0 % (ref 0.0–0.2)

## 2023-02-12 LAB — MAGNESIUM: Magnesium: 1.9 mg/dL (ref 1.7–2.4)

## 2023-02-12 LAB — URINALYSIS, W/ REFLEX TO CULTURE (INFECTION SUSPECTED)
Glucose, UA: NEGATIVE mg/dL
Ketones, ur: NEGATIVE mg/dL
Leukocytes,Ua: NEGATIVE
Nitrite: POSITIVE — AB
Protein, ur: 100 mg/dL — AB
Specific Gravity, Urine: 1.044 — ABNORMAL HIGH (ref 1.005–1.030)
pH: 6 (ref 5.0–8.0)

## 2023-02-12 LAB — URIC ACID: Uric Acid, Serum: 7 mg/dL (ref 3.7–8.6)

## 2023-02-12 LAB — APTT: aPTT: 28 seconds (ref 24–36)

## 2023-02-12 LAB — LACTIC ACID, PLASMA
Lactic Acid, Venous: 3.1 mmol/L (ref 0.5–1.9)
Lactic Acid, Venous: 1.7 mmol/L (ref 0.5–1.9)

## 2023-02-12 LAB — PROTIME-INR
INR: 1.2 (ref 0.8–1.2)
Prothrombin Time: 15.5 seconds — ABNORMAL HIGH (ref 11.4–15.2)

## 2023-02-12 MED ORDER — PROCHLORPERAZINE EDISYLATE 10 MG/2ML IJ SOLN: 10.0000 mg | INTRAMUSCULAR | Status: DC | PRN

## 2023-02-12 MED ORDER — LACTATED RINGERS IV SOLN: INTRAVENOUS | Status: DC

## 2023-02-12 MED ORDER — IOHEXOL 300 MG/ML  SOLN
100.0000 mL | Freq: Once | INTRAMUSCULAR | Status: AC | PRN
  Administered 2023-02-12: 100 mL via INTRAVENOUS

## 2023-02-12 MED ORDER — GUAIFENESIN ER 600 MG PO TB12: 600.0000 mg | ORAL_TABLET | Freq: Two times a day (BID) | ORAL | Status: DC | PRN

## 2023-02-12 MED ORDER — ATORVASTATIN CALCIUM 40 MG PO TABS
40.0000 mg | ORAL_TABLET | Freq: Every evening | ORAL | Status: DC
  Administered 2023-02-12 – 2023-02-17 (×6): 40 mg via ORAL
  Filled 2023-02-12 (×6): qty 1

## 2023-02-12 MED ORDER — ONDANSETRON HCL 4 MG/2ML IJ SOLN: 4.0000 mg | Freq: Four times a day (QID) | INTRAMUSCULAR | Status: DC | PRN

## 2023-02-12 MED ORDER — BISACODYL 5 MG PO TBEC
5.0000 mg | DELAYED_RELEASE_TABLET | Freq: Every day | ORAL | Status: DC | PRN
  Administered 2023-02-16: 5 mg via ORAL
  Filled 2023-02-12: qty 1

## 2023-02-12 MED ORDER — METOPROLOL TARTRATE 12.5 MG HALF TABLET
12.5000 mg | ORAL_TABLET | Freq: Two times a day (BID) | ORAL | Status: DC
  Administered 2023-02-12 – 2023-02-13 (×2): 12.5 mg via ORAL
  Filled 2023-02-12 (×2): qty 1

## 2023-02-12 MED ORDER — ACETAMINOPHEN 325 MG PO TABS
650.0000 mg | ORAL_TABLET | Freq: Four times a day (QID) | ORAL | Status: DC | PRN
  Administered 2023-02-15 – 2023-02-17 (×3): 650 mg via ORAL
  Filled 2023-02-12 (×3): qty 2

## 2023-02-12 MED ORDER — ONDANSETRON HCL 4 MG PO TABS: 4.0000 mg | ORAL_TABLET | Freq: Four times a day (QID) | ORAL | Status: DC | PRN

## 2023-02-12 MED ORDER — ACETAMINOPHEN 650 MG RE SUPP: 650.0000 mg | Freq: Four times a day (QID) | RECTAL | Status: DC | PRN

## 2023-02-12 MED ORDER — LACTATED RINGERS IV BOLUS
1000.0000 mL | Freq: Once | INTRAVENOUS | Status: AC
  Administered 2023-02-12: 1000 mL via INTRAVENOUS

## 2023-02-12 MED ORDER — OXYCODONE HCL 5 MG PO TABS: 5.0000 mg | ORAL_TABLET | ORAL | Status: DC | PRN

## 2023-02-12 MED ORDER — FENTANYL CITRATE PF 50 MCG/ML IJ SOSY
12.5000 ug | PREFILLED_SYRINGE | INTRAMUSCULAR | Status: DC | PRN
  Administered 2023-02-13 – 2023-02-16 (×2): 12.5 ug via INTRAVENOUS
  Filled 2023-02-12: qty 1

## 2023-02-12 MED ORDER — HYDROCODONE-ACETAMINOPHEN 5-325 MG PO TABS
1.0000 | ORAL_TABLET | ORAL | Status: DC | PRN
  Administered 2023-02-15 – 2023-02-16 (×4): 1 via ORAL
  Filled 2023-02-12 (×4): qty 1

## 2023-02-12 MED ORDER — ALBUTEROL SULFATE (2.5 MG/3ML) 0.083% IN NEBU: 2.5000 mg | INHALATION_SOLUTION | RESPIRATORY_TRACT | Status: DC | PRN

## 2023-02-12 NOTE — ED Triage Notes (Addendum)
Pt was brought [facility] from [facility] due to profusely sweating and hypotension with SBP in the 90s per [facility] nurse. Pt reports he was seeing the [facility] to review his PET scan and all of a sudden felt very weak, lightheaded and sweating like he was going to pass out. Pt never had LOC but felt like he was pretty close to it. Pt also c/o lower abdominal pain and down into his left groin and into leg which pt reports the Oncologist says is from the cancer.

## 2023-02-12 NOTE — Progress Notes (Signed)
Oley Balm, MD  Caroleen Hamman, NT PROCEDURE / BIOPSY REVIEW Date: 02/12/23  Requested Biopsy site: L paraaortic LAN Reason for request: eval lymphoma Imaging review: Best seen on CT 01/24/23 Im 95 Se 2  Decision: Approved Imaging modality to perform: CT Schedule with: Moderate Sedation Schedule for: Any VIR  Additional comments: @VIR : in [place]  Please contact me with questions, concerns, or if issue pertaining to this request arise.  Dayne Oley Balm, MD [facility]       Previous Messages    ----- Message ----- From: Caroleen Hamman, NT Sent: 02/12/2023  10:11 AM EDT To: Ir Procedure Requests Subject: CT Biopsy                                      Procedure: CT Biopsy  Reason: left retroperitoneal LN bx Dx: Lymphadenopathy, retroperitoneal  History: NM and CT in chart  Provider: Doreatha Massed, MD  Contact: [phone]

## 2023-02-12 NOTE — ED Notes (Signed)
ED Provider at bedside. 

## 2023-02-12 NOTE — ED Provider Notes (Signed)
[facility] Provider Note  CSN: [id number] Arrival date & time: 02/12/23 1006  Chief Complaint(s) Near Syncope  HPI Adam Reyes is a 67 y.o. male with PMH CLL previously on ibrutinib, HTN, CAD status post STEMI, iron deficiency who presents emergency room for evaluation of near syncope, night sweats and hypotension.  Patient states that over the last week he has had persistent sweating particularly at night.  He was seen by his primary oncologist Dr. Ellin Saba this morning in clinic who found the patient to be hypotensive the patient had a syncopal episode with diaphoresis.  He was then transferred to the emergency department for further evaluation.  Per Dr. Ellin Saba, patient had a recent PET scan that has not been officially read by radiologist yet but he does have concern for possible Richter's transformation.  Patient has been endorsing some intermittent right and left lower quadrant abdominal pain.  Here in the emergency room, patient is alert and oriented answering all questions appropriately but does arrive hypotensive.   Past Medical History Past Medical History:  Diagnosis Date   Chronic lymphocytic leukemia (CLL), B-cell (HCC)    Small Cell Lymphoma   Coronary artery disease, non-occlusive 02/2017   Cardiac cath in setting of MI: 50 and 55% bifurcation LAD-Diag1   Enlarged lymph node    left neck   Hypertension    STEMI (ST elevation myocardial infarction) (HCC) 03/05/2017   hx/notes 03/05/2017 -likely aborted anterior STEMI with 50% bifurcation LAD-Diag1.  No PCI.  Preserved EF   Syncope due to orthostatic hypotension 04/21/2018   Patient Active Problem List   Diagnosis Date Noted   Hypotension 02/12/2023   Poor appetite 02/12/2023   Low energy 02/12/2023   Question of Richter's transformation 02/12/2023   Elevated lactic acid level 02/12/2023   Dehydration 02/12/2023   Hyponatremia 02/12/2023   Elevated liver enzymes 02/12/2023   AKI (acute  kidney injury) (HCC) 02/12/2023   Hyperglycemia 02/12/2023   Prediabetes 01/31/2023   Encounter for general adult medical examination with abnormal findings 05/16/2022   Refused influenza vaccine 08/24/2021   Essential hypertension 08/06/2021   Aortic atherosclerosis (HCC) 08/06/2021   History of gastrointestinal bleeding 01/24/2021   Iron deficiency anemia 09/22/2020   Symptomatic anemia 09/18/2020   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 Authorizing Provider  aspirin 81 MG chewable tablet Chew 1 tablet (81 mg total) by mouth every other day. 06/20/17  Yes Marykay Lex, MD  atorvastatin (LIPITOR) 40 MG tablet KEEP OV. Patient taking differently: Take 40 mg by mouth daily. KEEP OV. 11/13/22  Yes Jodelle Gross, NP  cholecalciferol (VITAMIN D3) 25 MCG (1000 UNIT) tablet Take 10,000 Units by mouth daily. 10/09/20  Yes [provider]  metoprolol tartrate (LOPRESSOR) 25 MG tablet Take 1 tablet (25 mg total) by mouth in the morning AND 0.5 tablets (12.5 mg total) every evening. 09/24/22  Yes Anabel Halon, MD  telmisartan (MICARDIS) 20 MG tablet Take 1 tablet (20 mg total) by mouth daily. 01/28/23  Yes Anabel Halon, MD  traMADol (ULTRAM) 50 MG tablet Take 1 tablet (50 mg total) by mouth every 6 (six)  hours as needed. 02/10/23  Yes Doreatha Massed, MD  ferrous sulfate 324 (65 Fe) MG TBEC Take 324 mg by mouth every other day.    [provider]                                                                                                                                    Past  Surgical History Past Surgical History:  Procedure Laterality Date   BIOPSY  10/10/2020   Procedure: BIOPSY;  Surgeon: Dolores Frame, MD;  [facility]: [facility];  Service: Gastroenterology;;   BIOPSY  11/14/2020   Procedure: BIOPSY;  Surgeon: Dolores Frame, MD;  [facility]: [facility];  Service: Gastroenterology;;   COLONOSCOPY N/A 10/11/2015   Procedure: COLONOSCOPY;  Surgeon: Malissa Hippo, MD;  [facility]: [facility];  Service: Endoscopy;  Laterality: N/A;  10/11/2015   COLONOSCOPY WITH PROPOFOL N/A 10/10/2020   Procedure: COLONOSCOPY WITH PROPOFOL;  Surgeon: Dolores Frame, MD;  [facility]: [facility];  Service: Gastroenterology;  Laterality: N/A;  AM   ENTEROSCOPY N/A 11/14/2020   Procedure: push enteroscopy;  Surgeon: Dolores Frame, MD;  [facility]: [facility];  Service: Gastroenterology;  Laterality: N/A;   ESOPHAGOGASTRODUODENOSCOPY (EGD) WITH PROPOFOL N/A 10/10/2020   Procedure: ESOPHAGOGASTRODUODENOSCOPY (EGD) WITH PROPOFOL;  Surgeon: Dolores Frame, MD;  [facility]: [facility];  Service: Gastroenterology;  Laterality: N/A;   ESOPHAGOGASTRODUODENOSCOPY (EGD) WITH PROPOFOL N/A 11/14/2020   Procedure: ESOPHAGOGASTRODUODENOSCOPY (EGD) WITH PROPOFOL;  Surgeon: Dolores Frame, MD;  [facility]: [facility];  Service: Gastroenterology;  Laterality: N/A;  12:00   GIVENS CAPSULE STUDY N/A 11/07/2020   Procedure: GIVENS CAPSULE STUDY;  Surgeon: Dolores Frame, MD;  [facility]: [facility];  Service: Gastroenterology;  Laterality: N/A;  7:30 am   Graded Exercise Tolerance Test (GXT/ETT)  05/2017   10.7 METs (9: 25 min.  Reached 103% max predicted heart rate).  No EKG findings to suggest coronary ischemia.  Negative, low risk GXT   HEMOSTASIS CLIP PLACEMENT  11/14/2020   Procedure: HEMOSTASIS CLIP PLACEMENT;  Surgeon: Dolores Frame, MD;  [facility]: [facility];  Service: Gastroenterology;;  small bowel nodule    LEFT HEART CATH AND CORONARY ANGIOGRAPHY N/A 03/05/2017   Procedure: LEFT HEART CATH AND CORONARY ANGIOGRAPHY;  Surgeon: Marykay Lex, MD;  Location: [facility];  Service: Cardiovascular: pLAD-Diag1 50-55% (non-flow-limiiting).  EF ~50-55%.  although bifurcation lesion was presumed Culprit - no PCI (not flow limiting). - Med Rx.   MASS BIOPSY Left 06/27/2015   Procedure: OPEN LEFT NECK BIOPSY ;  Surgeon: Newman Pies, MD;  Location: [facility];  Service: ENT;  Laterality: Left;   POLYPECTOMY  10/10/2020   Procedure: POLYPECTOMY;  Surgeon: Dolores Frame, MD;  [facility]: [facility];  Service: Gastroenterology;;   REFRACTIVE SURGERY Bilateral    Family History Family History  Problem Relation Age of  Onset   Diabetes Mother    Cancer Father    Cancer Brother     Social History Social History   Tobacco Use   Smoking status: Former    Types: Cigarettes   Smokeless tobacco: Former    Types: Chew, Snuff   Tobacco comments:    03/06/2017 "quit smoking when I was young; quit chew/snuff in ~ 2008"  Vaping Use   Vaping status: Never Used  Substance Use Topics   Alcohol use: Yes    Alcohol/week: 2.0 standard drinks of alcohol    Types: 2 Cans of beer per week    Comment: 2-3 drinks weekly   Drug use: No   Allergies Patient has no known allergies.  Review of Systems Review of Systems  Constitutional:  Positive for diaphoresis.  Gastrointestinal:  Positive for abdominal pain.  Neurological:  Positive for syncope and light-headedness.    Physical Exam Vital Signs  I have reviewed the triage vital signs BP 110/61 (BP Location: Left Arm)   Pulse 77   Temp 98.1 F (36.7 C) (Oral)   Resp 20   Ht 6\' 4"  (1.93 m)   Wt 121.6 kg   SpO2 97%   BMI 32.63 kg/m   Physical Exam Constitutional:      General: He is not in acute distress.    Appearance: Normal appearance.  HENT:     Head: Normocephalic and atraumatic.     Nose: No congestion or  rhinorrhea.  Eyes:     General:        Right eye: No discharge.        Left eye: No discharge.     Extraocular Movements: Extraocular movements intact.     Pupils: Pupils are equal, round, and reactive to light.  Cardiovascular:     Rate and Rhythm: Normal rate and regular rhythm.     Heart sounds: No murmur heard. Pulmonary:     Effort: No respiratory distress.     Breath sounds: No wheezing or rales.  Abdominal:     General: There is no distension.     Tenderness: There is no abdominal tenderness.  Musculoskeletal:        General: Normal range of motion.     Cervical back: Normal range of motion.  Skin:    General: Skin is warm and dry.  Neurological:     General: No focal deficit present.     Mental Status: He is alert.     ED Results and Treatments Labs (all labs ordered are listed, but only abnormal results are displayed) Labs Reviewed  LACTIC ACID, PLASMA - Abnormal; Notable for the following components:      Result Value   Lactic Acid, Venous 3.1 (*)    All other components within normal limits  COMPREHENSIVE METABOLIC PANEL - Abnormal; Notable for the following components:   Sodium 131 (*)    Chloride 96 (*)    Glucose, Bld 114 (*)    BUN 25 (*)    Creatinine, Ser 1.48 (*)    Total Protein 5.8 (*)    Albumin 3.4 (*)    AST 45 (*)    ALT 45 (*)    Total Bilirubin 3.7 (*)    GFR, Estimated 52 (*)    All other components within normal limits  CBC WITH DIFFERENTIAL/PLATELET - Abnormal; Notable for the following components:   RBC 3.91 (*)    Hemoglobin 12.8 (*)    HCT 37.9 (*)    Platelets  149 (*)    Basophils Absolute 0.2 (*)    All other components within normal limits  PROTIME-INR - Abnormal; Notable for the following components:   Prothrombin Time 15.5 (*)    All other components within normal limits  CULTURE, BLOOD (ROUTINE X 2)  CULTURE, BLOOD (ROUTINE X 2)  LACTIC ACID, PLASMA  APTT  URINALYSIS, W/ REFLEX TO CULTURE (INFECTION SUSPECTED)   COMPREHENSIVE METABOLIC PANEL  MAGNESIUM  CBC                                                                                                                          Radiology CT ABDOMEN PELVIS W CONTRAST  Result Date: 02/12/2023 CLINICAL DATA:  History of small lymphocytic lymphoma with progression on recent PET-CT. Right lower quadrant abdominal pain. 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 * Tracking Code: BO * EXAM: CT ABDOMEN AND PELVIS WITH CONTRAST TECHNIQUE: Multidetector CT imaging of the abdomen and pelvis was performed using the standard protocol following bolus administration of intravenous contrast. RADIATION DOSE REDUCTION: This exam was performed according to the departmental dose-optimization program which includes automated exposure control, adjustment of the mA and/or kV according to patient size and/or use of iterative reconstruction technique. CONTRAST:  OMNIPAQUE IOHEXOL 300 MG/ML  SOLN COMPARISON:  PET-CT 02/06/2023. CT of the chest, abdomen and pelvis 01/24/2023 and 04/11/2022. FINDINGS: Lower chest: Stable linear atelectasis or scarring at both lung bases. Small pulmonary nodules bilaterally are unchanged from recent prior imaging, measuring up to 8 mm in the right lower lobe and 9 mm in the left lower lobe, both on image 9/5. There is progressive subcarinal and distal paraesophageal adenopathy as seen on recent PET-CT. A distal right paraesophageal node measures up to 5.4 x 4.6 cm on image 14/2. Hepatobiliary: The liver is normal in density without suspicious focal abnormality. No evidence of gallstones, gallbladder wall thickening or biliary dilatation. Pancreas: Unremarkable. No pancreatic ductal dilatation or surrounding inflammatory changes. Spleen: Progressive splenomegaly compared with recent CT. No focal abnormality identified. Adrenals/Urinary Tract: Both adrenal glands appear normal. No evidence of urinary tract calculus, suspicious renal lesion or hydronephrosis.  Unchanged cyst in the mid left kidney for which no follow-up imaging is recommended. Stable bladder compression by the bilateral pelvic lymphadenopathy. No intrinsic bladder abnormality identified. Stomach/Bowel: No enteric contrast administered. The stomach appears unremarkable for its degree of distension. No evidence of bowel wall thickening, distention or surrounding inflammatory change. Vascular/Lymphatic: Again demonstrated is extensive retroperitoneal, pelvic and bilateral inguinal adenopathy. No significant change is seen from the recent PET-CT, although some of the lymph nodes have enlarged from the diagnostic CT done 01/24/2023. For example, there is a left external iliac node measuring 4.5 cm short axis on image 86/2 which previously measured 3.7 cm. A presacral node measuring 3.1 cm on image 72/2 previously measured 2.3 cm. No nodal necrosis identified. Mild aortic and branch vessel atherosclerosis without evidence of aneurysm or large vessel occlusion. Reproductive: The prostate gland appears mildly enlarged, but  stable. Other: No evidence of abdominal wall mass or hernia. No ascites or pneumoperitoneum. Musculoskeletal: No acute or significant osseous findings. There is multilevel spondylosis with a grade 1 anterolisthesis at L5-S1 secondary to underlying pars defects. Associated prominent foraminal narrowing in the lower lumbar spine, unchanged. Unless specific follow-up recommendations are mentioned in the findings or impression sections, no imaging follow-up of any mentioned incidental findings is recommended. IMPRESSION: 1. Known extensive adenopathy in the lower chest, abdomen and pelvis has mildly progressed compared with the CTs of less than 3 weeks earlier consistent with progressive lymphoma. 2. Progressive splenomegaly. 3. Stable small pulmonary nodules at both lung bases. 4. No definite acute findings. 5. Stable lumbar spondylosis with grade 1 anterolisthesis at L5-S1 secondary to underlying  pars defects. Associated prominent foraminal narrowing in the lower lumbar spine. 6.  Aortic Atherosclerosis (ICD10-I70.0). Electronically Signed   By: Carey Bullocks M.D.   On: 02/12/2023 12:16   DG Chest Port 1 View  Result Date: 02/12/2023 CLINICAL DATA:  Sepsis.  History of lymphoma EXAM: PORTABLE CHEST 1 VIEW COMPARISON:  PET-CT 02/06/2023 FINDINGS: Subtle bandlike opacity right lung base. Atelectasis is favored. No consolidation, pneumothorax or effusion. No edema. Normal cardiopericardial silhouette. Overlapping cardiac leads. IMPRESSION: Right basilar atelectasis.  No pneumothorax or effusion. Electronically Signed   By: Karen Kays M.D.   On: 02/12/2023 11:29    Pertinent labs & imaging results that were available during my care of the patient were reviewed by me and considered in my medical decision making (see MDM for details).  Medications Ordered in ED Medications  atorvastatin (LIPITOR) tablet 40 mg (has no administration in time range)  metoprolol tartrate (LOPRESSOR) tablet 12.5 mg (has no administration in time range)  lactated ringers infusion ( Intravenous New Bag/Given 02/12/23 1636)  acetaminophen (TYLENOL) tablet 650 mg (has no administration in time range)    Or  acetaminophen (TYLENOL) suppository 650 mg (has no administration in time range)  fentaNYL (SUBLIMAZE) injection 12.5 mcg (has no administration in time range)  bisacodyl (DULCOLAX) EC tablet 5 mg (has no administration in time range)  ondansetron (ZOFRAN) tablet 4 mg (has no administration in time range)    Or  ondansetron (ZOFRAN) injection 4 mg (has no administration in time range)  prochlorperazine (COMPAZINE) injection 10 mg (has no administration in time range)  albuterol (PROVENTIL) (2.5 MG/3ML) 0.083% nebulizer solution 2.5 mg (has no administration in time range)  guaiFENesin (MUCINEX) 12 hr tablet 600 mg (has no administration in time range)  HYDROcodone-acetaminophen (NORCO/VICODIN) 5-325 MG per  tablet 1 tablet (has no administration in time range)  lactated ringers bolus 1,000 mL (0 mLs Intravenous Stopped 02/12/23 1231)  iohexol (OMNIPAQUE) 300 MG/ML solution 100 mL (100 mLs Intravenous Contrast Given 02/12/23 1053)  Procedures .Critical Care  Performed by: Glendora Score, MD Authorized by: Glendora Score, MD   Critical care provider statement:    Critical care time (minutes):  30   Critical care was necessary to treat or prevent imminent or life-threatening deterioration of the following conditions:  Circulatory failure   Critical care was time spent personally by me on the following activities:  Development of treatment plan with patient or surrogate, discussions with consultants, evaluation of patient's response to treatment, examination of patient, ordering and review of laboratory studies, ordering and review of radiographic studies, ordering and performing treatments and interventions, pulse oximetry, re-evaluation of patient's condition and review of old charts   (including critical care time)  Medical Decision Making / ED Course   This patient presents to the ED for concern of syncope, abdominal pain, this involves an extensive number of treatment options, and is a complaint that carries with it a high risk of complications and morbidity.  The differential diagnosis includes hypovolemic shock, septic shock, cardiogenic syncope, orthostatic syncope, progression of underlying cancer  MDM: Patient seen in emergency room for evaluation of abdominal pain, syncope and hypotension.  Physical exam reveals some mild diaphoresis but no appreciable tenderness to palpation in the abdomen.  Patient arrives hypotensive but very fluid responsive and blood pressures normalized after 1 L lactated Ringer's.  Laboratory evaluation with hyponatremia 131,  hypochloremia to 96, BUN 25 creatinine 1.48, albumin 3.4, AST 45, ALT 45, hemoglobin 12.8 initial lactic acid 3.1 but follow-up normalized at 1.7 after fluid resuscitation.  CT abdomen pelvis obtained showing extensive adenopathy in the lower chest abdomen pelvis progressed from CT 3 weeks ago concerning for progressive lymphoma, progressive splenomegaly.  Dr. Ellin Saba of oncology would like the patient admitted for expedited biopsy due to concern for Richter's transformation.  I spoke with the interventional radiologist on-call Dr. Grace Isaac who will help arrange CT-guided biopsy while inpatient.  Patient admitted to medicine.   Additional history obtained: -Additional history obtained from wife -External records from outside source obtained and reviewed including: Chart review including previous notes, labs, imaging, consultation notes   Lab Tests: -I ordered, reviewed, and interpreted labs.   The pertinent results include:   Labs Reviewed  LACTIC ACID, PLASMA - Abnormal; Notable for the following components:      Result Value   Lactic Acid, Venous 3.1 (*)    All other components within normal limits  COMPREHENSIVE METABOLIC PANEL - Abnormal; Notable for the following components:   Sodium 131 (*)    Chloride 96 (*)    Glucose, Bld 114 (*)    BUN 25 (*)    Creatinine, Ser 1.48 (*)    Total Protein 5.8 (*)    Albumin 3.4 (*)    AST 45 (*)    ALT 45 (*)    Total Bilirubin 3.7 (*)    GFR, Estimated 52 (*)    All other components within normal limits  CBC WITH DIFFERENTIAL/PLATELET - Abnormal; Notable for the following components:   RBC 3.91 (*)    Hemoglobin 12.8 (*)    HCT 37.9 (*)    Platelets 149 (*)    Basophils Absolute 0.2 (*)    All other components within normal limits  PROTIME-INR - Abnormal; Notable for the following components:   Prothrombin Time 15.5 (*)    All other components within normal limits  CULTURE, BLOOD (ROUTINE X 2)  CULTURE, BLOOD (ROUTINE X 2)  LACTIC  ACID, PLASMA  APTT  URINALYSIS, W/ REFLEX  TO CULTURE (INFECTION SUSPECTED)  COMPREHENSIVE METABOLIC PANEL  MAGNESIUM  CBC      EKG   EKG Interpretation Date/Time:  Wednesday February 12 2023 10:23:07 EDT Ventricular Rate:  70 PR Interval:  184 QRS Duration:  104 QT Interval:  400 QTC Calculation: 432 R Axis:   13  Text Interpretation: Sinus rhythm Abnormal R-wave progression, early transition Confirmed by Iyanna Drummer ([id number]) on 02/12/2023 10:56:38 AM         Imaging Studies ordered: I ordered imaging studies including CT abdomen pelvis, chest x-ray I independently visualized and interpreted imaging. I agree with the radiologist interpretation   Medicines ordered and prescription drug management: Meds ordered this encounter  Medications   lactated ringers bolus 1,000 mL   iohexol (OMNIPAQUE) 300 MG/ML solution 100 mL   atorvastatin (LIPITOR) tablet 40 mg    KEEP OV.     metoprolol tartrate (LOPRESSOR) tablet 12.5 mg   lactated ringers infusion   OR Linked Order Group    acetaminophen (TYLENOL) tablet 650 mg    acetaminophen (TYLENOL) suppository 650 mg   DISCONTD: oxyCODONE (Oxy IR/ROXICODONE) immediate release tablet 5 mg   fentaNYL (SUBLIMAZE) injection 12.5 mcg   bisacodyl (DULCOLAX) EC tablet 5 mg   OR Linked Order Group    ondansetron (ZOFRAN) tablet 4 mg    ondansetron (ZOFRAN) injection 4 mg   prochlorperazine (COMPAZINE) injection 10 mg   albuterol (PROVENTIL) (2.5 MG/3ML) 0.083% nebulizer solution 2.5 mg   guaiFENesin (MUCINEX) 12 hr tablet 600 mg   HYDROcodone-acetaminophen (NORCO/VICODIN) 5-325 MG per tablet 1 tablet    -I have reviewed the patients home medicines and have made adjustments as needed  Critical interventions Fluid resuscitation  Consultations Obtained: I requested consultation with the interventional radiologist on-call Dr. Grace Isaac,  and discussed lab and imaging findings as well as pertinent plan - they recommend: Inpatient  biopsy   Cardiac Monitoring: The patient was maintained on a cardiac monitor.  I personally viewed and interpreted the cardiac monitored which showed an underlying rhythm of: NSR  Social Determinants of Health:  Factors impacting patients care include: none   Reevaluation: After the interventions noted above, I reevaluated the patient and found that they have :improved  Co morbidities that complicate the patient evaluation  Past Medical History:  Diagnosis Date   Chronic lymphocytic leukemia (CLL), B-cell (HCC)    Small Cell Lymphoma   Coronary artery disease, non-occlusive 02/2017   Cardiac cath in setting of MI: 50 and 55% bifurcation LAD-Diag1   Enlarged lymph node    left neck   Hypertension    STEMI (ST elevation myocardial infarction) (HCC) 03/05/2017   hx/notes 03/05/2017 -likely aborted anterior STEMI with 50% bifurcation LAD-Diag1.  No PCI.  Preserved EF   Syncope due to orthostatic hypotension 04/21/2018      Dispostion: I considered admission for this patient, and due to concern for Hal Hope transformation with need for urgent biopsy patient require hospital admission     Final Clinical Impression(s) / ED Diagnoses Final diagnoses:  None     @PCDICTATION @    Glendora Score, MD 02/12/23 1800

## 2023-02-12 NOTE — Patient Instructions (Addendum)
[facility] at [facility] Discharge Instructions   You were seen and examined today by Dr. Ellin Saba.  He reviewed the results of your PET scan. There is an area of lymph nodes next to your left kidney that is lighting up brightly. This area is pushing your bladder to the side as well. This is also causing the swelling in the left leg. This area of lymph nodes will need to be biopsied.   Dr. Kirtland Bouchard wants you to stop the telmisartan since your blood pressure is low.    Thank you for choosing [facility] at [facility] to provide your oncology and hematology care.  To afford each patient quality time with our provider, please arrive at least 15 minutes before your scheduled appointment time.   If you have a lab appointment with the Cancer Center please come in thru the Main Entrance and check in at the main information desk.  You need to re-schedule your appointment should you arrive 10 or more minutes late.  We strive to give you quality time with our providers, and arriving late affects you and other patients whose appointments are after yours.  Also, if you no show three or more times for appointments you may be dismissed from the clinic at the providers discretion.     Again, thank you for choosing [facility].  Our hope is that these requests will decrease the amount of time that you wait before being seen by our physicians.       _____________________________________________________________  Should you have questions after your visit to [facility], please contact our office at [phone] and follow the prompts.  Our office hours are 8:00 a.m. and 4:30 p.m. Monday - Friday.  Please note that voicemails left after 4:00 p.m. may not be returned until the following business day.  We are closed weekends and major holidays.  You do have access to a nurse 24-7, just call the main number to the clinic [phone] and do not press  any options, hold on the line and a nurse will answer the phone.    For prescription refill requests, have your pharmacy contact our office and allow 72 hours.    Due to Covid, you will need to wear a mask upon entering the hospital. If you do not have a mask, a mask will be given to you at the Main Entrance upon arrival. For doctor visits, patients may have 1 support person age 67 or older with them. For treatment visits, patients can not have anyone with them due to social distancing guidelines and our immunocompromised population.

## 2023-02-12 NOTE — Hospital Course (Signed)
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  He complained of profuse sweating started 2 to 3 days ago associated with lightheadedness.  He also is having night sweats in the last 2 to 3 days.  He has been out of his ibrutinib for the last 4 days due to being out of the medication.  He is also having a lot of body pains and aches.  He was recently sent for a PET scan by Dr. Ellin Saba with results pending.  He was sent to the ED from the [facility] today when they found him to be hypotensive and severely weakened and deconditioned.  Dr. Ellin Saba has been concerned about Richter's transformation and has made arrangements for emergency CT biopsy to be done at [facility] on 02/13/2023.  Patient is being admitted for management of AKI, dehydration and weakness.

## 2023-02-12 NOTE — ED Notes (Signed)
ED TO INPATIENT HANDOFF REPORT  ED Nurse Name and Phone #: Delorise Shiner [phone]  S Name/Age/Gender Adam Reyes 67 y.o. male Room/Bed: APA05/APA05  Code Status   Code Status: Full Code  Home/SNF/Other Home Patient oriented to: self, place, time, and situation Is this baseline? Yes   Triage Complete: Triage complete  Chief Complaint Hypotension [I95.9]  Triage Note Pt was brought down from Cancer Center due to profusely sweating and hypotension with SBP in the 90s per Cancer Center nurse. Pt reports he was seeing the Cancer Center to review his PET scan and all of a sudden felt very weak, lightheaded and sweating like he was going to pass out. Pt never had LOC but felt like he was pretty close to it. Pt also c/o lower abdominal pain and down into his left groin and into leg which pt reports the Oncologist says is from the cancer.    Allergies No Known Allergies  Level of Care/Admitting Diagnosis ED Disposition     ED Disposition  Admit   Condition  --   Comment  Hospital Area: [facility] [[id number]]  Level of Care: Telemetry [5]  Covid Evaluation: Asymptomatic - no recent exposure (last 10 days) testing not required  Diagnosis: Hypotension [454098]  Admitting Physician: Cleora Fleet [[id number]]  Attending Physician: Cleora Fleet [[id number]]          B Medical/Surgery History Past Medical History:  Diagnosis Date   Chronic lymphocytic leukemia (CLL), B-cell (HCC)    Small Cell Lymphoma   Coronary artery disease, non-occlusive 02/2017   Cardiac cath in setting of MI: 50 and 55% bifurcation LAD-Diag1   Enlarged lymph node    left neck   Hypertension    STEMI (ST elevation myocardial infarction) (HCC) 03/05/2017   hx/notes 03/05/2017 -likely aborted anterior STEMI with 50% bifurcation LAD-Diag1.  No PCI.  Preserved EF   Syncope due to orthostatic hypotension 04/21/2018   Past Surgical History:  Procedure Laterality Date   BIOPSY  10/10/2020    Procedure: BIOPSY;  Surgeon: Dolores Frame, MD;  [facility];  Service: Gastroenterology;;   BIOPSY  11/14/2020   Procedure: BIOPSY;  Surgeon: Dolores Frame, MD;  [facility];  Service: Gastroenterology;;   COLONOSCOPY N/A 10/11/2015   Procedure: COLONOSCOPY;  Surgeon: Malissa Hippo, MD;  [facility];  Service: Endoscopy;  Laterality: N/A;  10/11/2015   COLONOSCOPY WITH PROPOFOL N/A 10/10/2020   Procedure: COLONOSCOPY WITH PROPOFOL;  Surgeon: Dolores Frame, MD;  [facility];  Service: Gastroenterology;  Laterality: N/A;  AM   ENTEROSCOPY N/A 11/14/2020   Procedure: push enteroscopy;  Surgeon: Dolores Frame, MD;  [facility];  Service: Gastroenterology;  Laterality: N/A;   ESOPHAGOGASTRODUODENOSCOPY (EGD) WITH PROPOFOL N/A 10/10/2020   Procedure: ESOPHAGOGASTRODUODENOSCOPY (EGD) WITH PROPOFOL;  Surgeon: Dolores Frame, MD;  [facility];  Service: Gastroenterology;  Laterality: N/A;   ESOPHAGOGASTRODUODENOSCOPY (EGD) WITH PROPOFOL N/A 11/14/2020   Procedure: ESOPHAGOGASTRODUODENOSCOPY (EGD) WITH PROPOFOL;  Surgeon: Dolores Frame, MD;  [facility];  Service: Gastroenterology;  Laterality: N/A;  12:00   GIVENS CAPSULE STUDY N/A 11/07/2020   Procedure: GIVENS CAPSULE STUDY;  Surgeon: Dolores Frame, MD;  [facility];  Service: Gastroenterology;  Laterality: N/A;  7:30 am   Graded Exercise Tolerance Test (GXT/ETT)  05/2017   10.7 METs (9: 25 min.  Reached 103% max predicted heart rate).  No EKG findings to  suggest coronary ischemia.  Negative, low risk GXT   HEMOSTASIS CLIP PLACEMENT  11/14/2020   Procedure: HEMOSTASIS CLIP PLACEMENT;  Surgeon: Dolores Frame, MD;  [facility];  Service: Gastroenterology;;  small bowel nodule   LEFT HEART CATH AND CORONARY ANGIOGRAPHY N/A 03/05/2017   Procedure: LEFT HEART CATH AND  CORONARY ANGIOGRAPHY;  Surgeon: Marykay Lex, MD;  Location: [facility] INVASIVE CV LAB;  Service: Cardiovascular: pLAD-Diag1 50-55% (non-flow-limiiting).  EF ~50-55%.  although bifurcation lesion was presumed Culprit - no PCI (not flow limiting). - Med Rx.   MASS BIOPSY Left 06/27/2015   Procedure: OPEN LEFT NECK BIOPSY ;  Surgeon: Newman Pies, MD;  Location: [facility];  Service: ENT;  Laterality: Left;   POLYPECTOMY  10/10/2020   Procedure: POLYPECTOMY;  Surgeon: Marguerita Merles, Reuel Boom, MD;  [facility];  Service: Gastroenterology;;   REFRACTIVE SURGERY Bilateral      A IV Location/Drains/Wounds Patient Lines/Drains/Airways Status     Active Line/Drains/Airways     Name Placement date Placement time Site Days   Peripheral IV 02/12/23 20 G 1" Right;Lateral Forearm 02/12/23  1028  Forearm  less than 1   Airway 10/10/20  0724  -- 855   Airway 10/10/20  0724  -- 855            Intake/Output Last 24 hours No intake or output data in the 24 hours ending 02/12/23 1506  Labs/Imaging Results for orders placed or performed during the hospital encounter of 02/12/23 (from the past 48 hour(s))  Lactic acid, plasma     Status: Abnormal   Collection Time: 02/12/23 10:54 AM  Result Value Ref Range   Lactic Acid, Venous 3.1 (HH) 0.5 - 1.9 mmol/L    Comment: CRITICAL RESULT CALLED TO, READ BACK BY AND VERIFIED WITH WHITE,M AT 11:10AM ON 02/12/23 BY [facility] Performed at [facility], [address], [place], [place] [address]   Comprehensive metabolic panel     Status: Abnormal   Collection Time: 02/12/23 10:54 AM  Result Value Ref Range   Sodium 131 (L) 135 - 145 mmol/L   Potassium 3.5 3.5 - 5.1 mmol/L   Chloride 96 (L) 98 - 111 mmol/L   CO2 22 22 - 32 mmol/L   Glucose, Bld 114 (H) 70 - 99 mg/dL    Comment: Glucose reference range applies only to samples taken after fasting for at least 8 hours.   BUN 25 (H) 8 - 23 mg/dL   Creatinine, Ser 2.13 (H) 0.61 - 1.24  mg/dL   Calcium 8.9 8.9 - 08.6 mg/dL   Total Protein 5.8 (L) 6.5 - 8.1 g/dL   Albumin 3.4 (L) 3.5 - 5.0 g/dL   AST 45 (H) 15 - 41 U/L   ALT 45 (H) 0 - 44 U/L   Alkaline Phosphatase 96 38 - 126 U/L   Total Bilirubin 3.7 (H) 0.3 - 1.2 mg/dL   GFR, Estimated 52 (L) >60 mL/min    Comment: (NOTE) Calculated using the CKD-EPI Creatinine Equation (2021)    Anion gap 13 5 - 15    Comment: Performed at [facility], [address], [place], [place] [address]  CBC with Differential     Status: Abnormal   Collection Time: 02/12/23 10:54 AM  Result Value Ref Range   WBC 7.5 4.0 - 10.5 K/uL   RBC 3.91 (L) 4.22 - 5.81 MIL/uL   Hemoglobin 12.8 (L) 13.0 - 17.0 g/dL   HCT 96.2 (L) 95.2 -  52.0 %   MCV 96.9 80.0 - 100.0 fL   MCH 32.7 26.0 - 34.0 pg   MCHC 33.8 30.0 - 36.0 g/dL   RDW 16.1 09.6 - 04.5 %   Platelets 149 (L) 150 - 400 K/uL   nRBC 0.0 0.0 - 0.2 %   Neutrophils Relative % 75 %   Neutro Abs 5.7 1.7 - 7.7 K/uL   Band Neutrophils 1 %   Lymphocytes Relative 14 %   Lymphs Abs 1.1 0.7 - 4.0 K/uL   Monocytes Relative 6 %   Monocytes Absolute 0.5 0.1 - 1.0 K/uL   Eosinophils Relative 2 %   Eosinophils Absolute 0.2 0.0 - 0.5 K/uL   Basophils Relative 2 %   Basophils Absolute 0.2 (H) 0.0 - 0.1 K/uL   WBC Morphology TOXIC GRANULATION    Smear Review MORPHOLOGY UNREMARKABLE    Abs Immature Granulocytes 0.00 0.00 - 0.07 K/uL   Polychromasia PRESENT     Comment: Performed at [facility], [address], [place], [place] [address]  Protime-INR     Status: Abnormal   Collection Time: 02/12/23 10:54 AM  Result Value Ref Range   Prothrombin Time 15.5 (H) 11.4 - 15.2 seconds   INR 1.2 0.8 - 1.2    Comment: (NOTE) INR goal varies based on device and disease states. Performed at [facility], [address], [place], [place] [address]   APTT     Status: None   Collection Time: 02/12/23 10:54 AM  Result Value Ref Range   aPTT 28 24 - 36 seconds    Comment: Performed at [facility],  [address] [place], [place] [address]  Blood Culture (routine x 2)     Status: None (Preliminary result)   Collection Time: 02/12/23 10:54 AM   Specimen: BLOOD  Result Value Ref Range   Specimen Description BLOOD BLOOD RIGHT HAND    Special Requests      BOTTLES DRAWN AEROBIC AND ANAEROBIC Blood Culture results may not be optimal due to an excessive volume of blood received in culture bottles Performed at [facility], [address] [place], [place] [address]    Culture PENDING    Report Status PENDING   Blood Culture (routine x 2)     Status: None (Preliminary result)   Collection Time: 02/12/23 10:54 AM   Specimen: BLOOD  Result Value Ref Range   Specimen Description BLOOD BLOOD LEFT ARM    Special Requests      BOTTLES DRAWN AEROBIC AND ANAEROBIC Blood Culture adequate volume Performed at [facility], [address] [place], [place] [address]    Culture PENDING    Report Status PENDING   Lactic acid, plasma     Status: None   Collection Time: 02/12/23  1:03 PM  Result Value Ref Range   Lactic Acid, Venous 1.7 0.5 - 1.9 mmol/L    Comment: Performed at [facility], [address] [place], [place] [address]   CT ABDOMEN PELVIS W CONTRAST  Result Date: 02/12/2023 CLINICAL DATA:  History of small lymphocytic lymphoma with progression on recent PET-CT. Right lower quadrant abdominal pain. 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 * Tracking Code: BO * EXAM: CT ABDOMEN AND PELVIS WITH CONTRAST TECHNIQUE: Multidetector CT imaging of the abdomen and pelvis was performed using the standard protocol following bolus administration of intravenous contrast. RADIATION DOSE REDUCTION: This exam was performed according to the departmental dose-optimization program which includes automated exposure control, adjustment of the mA and/or kV according to patient  size and/or use of iterative reconstruction technique. CONTRAST:  OMNIPAQUE IOHEXOL 300 MG/ML  SOLN COMPARISON:  PET-CT 02/06/2023. CT of the  chest, abdomen and pelvis 01/24/2023 and 04/11/2022. FINDINGS: Lower chest: Stable linear atelectasis or scarring at both lung bases. Small pulmonary nodules bilaterally are unchanged from recent prior imaging, measuring up to 8 mm in the right lower lobe and 9 mm in the left lower lobe, both on image 9/5. There is progressive subcarinal and distal paraesophageal adenopathy as seen on recent PET-CT. A distal right paraesophageal node measures up to 5.4 x 4.6 cm on image 14/2. Hepatobiliary: The liver is normal in density without suspicious focal abnormality. No evidence of gallstones, gallbladder wall thickening or biliary dilatation. Pancreas: Unremarkable. No pancreatic ductal dilatation or surrounding inflammatory changes. Spleen: Progressive splenomegaly compared with recent CT. No focal abnormality identified. Adrenals/Urinary Tract: Both adrenal glands appear normal. No evidence of urinary tract calculus, suspicious renal lesion or hydronephrosis. Unchanged cyst in the mid left kidney for which no follow-up imaging is recommended. Stable bladder compression by the bilateral pelvic lymphadenopathy. No intrinsic bladder abnormality identified. Stomach/Bowel: No enteric contrast administered. The stomach appears unremarkable for its degree of distension. No evidence of bowel wall thickening, distention or surrounding inflammatory change. Vascular/Lymphatic: Again demonstrated is extensive retroperitoneal, pelvic and bilateral inguinal adenopathy. No significant change is seen from the recent PET-CT, although some of the lymph nodes have enlarged from the diagnostic CT done 01/24/2023. For example, there is a left external iliac node measuring 4.5 cm short axis on image 86/2 which previously measured 3.7 cm. A presacral node measuring 3.1 cm on image 72/2 previously measured 2.3 cm. No nodal necrosis identified. Mild aortic and branch vessel atherosclerosis without evidence of aneurysm or large vessel occlusion.  Reproductive: The prostate gland appears mildly enlarged, but stable. Other: No evidence of abdominal wall mass or hernia. No ascites or pneumoperitoneum. Musculoskeletal: No acute or significant osseous findings. There is multilevel spondylosis with a grade 1 anterolisthesis at L5-S1 secondary to underlying pars defects. Associated prominent foraminal narrowing in the lower lumbar spine, unchanged. Unless specific follow-up recommendations are mentioned in the findings or impression sections, no imaging follow-up of any mentioned incidental findings is recommended. IMPRESSION: 1. Known extensive adenopathy in the lower chest, abdomen and pelvis has mildly progressed compared with the CTs of less than 3 weeks earlier consistent with progressive lymphoma. 2. Progressive splenomegaly. 3. Stable small pulmonary nodules at both lung bases. 4. No definite acute findings. 5. 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 Associated prominent foraminal narrowing in the lower lumbar spine. 6.  Aortic Atherosclerosis (ICD10-I70.0). Electronically Signed   By: Carey Bullocks M.D.   On: 02/12/2023 12:16   DG Chest Port 1 View  Result Date: 02/12/2023 CLINICAL DATA:  Sepsis.  History of lymphoma EXAM: PORTABLE CHEST 1 VIEW COMPARISON:  PET-CT 02/06/2023 FINDINGS: Subtle bandlike opacity right lung base. Atelectasis is favored. No consolidation, pneumothorax or effusion. No edema. Normal cardiopericardial silhouette. Overlapping cardiac leads. IMPRESSION: Right basilar atelectasis.  No pneumothorax or effusion. Electronically Signed   By: Karen Kays M.D.   On: 02/12/2023 11:29    Pending Labs Unresulted Labs (From admission, onward)     Start     Ordered   02/13/23 0500  Comprehensive metabolic panel  Daily,   R      02/12/23 1504   02/13/23 0500  Magnesium  Tomorrow morning,   R  02/12/23 1504   02/13/23 0500  CBC  Daily,   R      02/12/23 1504    02/12/23 1015  Urinalysis, w/ Reflex to Culture (Infection Suspected) -Urine, Clean Catch  (Undifferentiated presentation (screening labs and basic nursing orders))  ONCE - URGENT,   URGENT       Question:  Specimen Source  Answer:  Urine, Clean Catch   02/12/23 1015            Vitals/Pain Today's Vitals   02/12/23 1421 02/12/23 1426 02/12/23 1451 02/12/23 1452  BP:    104/65  Pulse:   78   Resp:   17   Temp:  97.7 F (36.5 C)    TempSrc:  Oral    SpO2:   93%   Weight:      Height:      PainSc: 0-No pain       Isolation Precautions No active isolations  Medications Medications  atorvastatin (LIPITOR) tablet 40 mg (has no administration in time range)  metoprolol tartrate (LOPRESSOR) tablet 12.5 mg (has no administration in time range)  lactated ringers infusion (has no administration in time range)  acetaminophen (TYLENOL) tablet 650 mg (has no administration in time range)    Or  acetaminophen (TYLENOL) suppository 650 mg (has no administration in time range)  oxyCODONE (Oxy IR/ROXICODONE) immediate release tablet 5 mg (has no administration in time range)  fentaNYL (SUBLIMAZE) injection 12.5 mcg (has no administration in time range)  bisacodyl (DULCOLAX) EC tablet 5 mg (has no administration in time range)  ondansetron (ZOFRAN) tablet 4 mg (has no administration in time range)    Or  ondansetron (ZOFRAN) injection 4 mg (has no administration in time range)  prochlorperazine (COMPAZINE) injection 10 mg (has no administration in time range)  albuterol (PROVENTIL) (2.5 MG/3ML) 0.083% nebulizer solution 2.5 mg (has no administration in time range)  guaiFENesin (MUCINEX) 12 hr tablet 600 mg (has no administration in time range)  lactated ringers bolus 1,000 mL (0 mLs Intravenous Stopped 02/12/23 1231)  iohexol (OMNIPAQUE) 300 MG/ML solution 100 mL (100 mLs Intravenous Contrast Given 02/12/23 1053)    Mobility walks with person assist    R Recommendations: See  Admitting Provider Note  Report given to:   Additional Notes:

## 2023-02-12 NOTE — Plan of Care (Signed)
IR was requested for LN bx.   Patient with concern for lymphoma, IR was requested for outpatient LN bx, CT CAP from 01/24/23 reviewed and approved for L RP LN by Dr. Deanne Coffer today.   Patient was sent to [facility] IR from [facility] due to diaphoresis and hypotension.  IR was requested for LN BX by [facility] primary team while patient is admitted.  Patient is on Asa 81 mg, typically requires 5 day hold, pt took ASA this morning.  PET from today reviewed with dr. Bryn Gulling to see if there is superficial target, Dr. Bryn Gulling recommends L RP LN bx as it is  most hypermetabolic on PET.  Discussed with Dr. Loreta Ave, OK to proceed with RP LN bx tomorrow.   [facility] RN/MD notified, made npo at midnight.  [facility] RN and IR charge RN to coordinate and arrange [facility] for tomorrow.   PLAN - NPO at [facility] - No AC/AP - CBC tomorrow, INR 1.2 today  - Formal consult to follow once patient is at [facility] tomorrow   The procedure is tentatively scheduled for tomorrow pending IR schedule.   Please call IR for questions and concerns.    Kamin Niblack H Kristle Wesch PA-C 02/12/2023 2:25 PM

## 2023-02-12 NOTE — Progress Notes (Signed)
Report called to T. Reche Dixon, ED Charge RN. Transferred to ED Room 5 via wheelchair.

## 2023-02-12 NOTE — H&P (Signed)
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  He complained of profuse sweating started 2 to 3 days ago associated with lightheadedness.  He also is having night sweats in the last 2 to 3 days.  He has been out of his ibrutinib for the last 4 days due to being out of the medication.  He is also having a lot of body pains and aches.  He was recently sent for a PET scan by Dr. Ellin Saba with results pending.  He was sent to the ED from the [facility] today when they found him to be hypotensive and severely weakened and deconditioned.  Dr. Ellin Saba has been concerned about Richter's transformation and has made arrangements for emergency CT biopsy to be done at [facility] on 02/13/2023.  Patient is being admitted for management of AKI, dehydration and weakness.    Past Medical History:  Diagnosis Date   Chronic lymphocytic leukemia (CLL), B-cell (HCC)    Small Cell Lymphoma   Coronary artery disease, non-occlusive 02/2017   Cardiac cath in setting of MI: 50 and 55% bifurcation LAD-Diag1   Enlarged lymph node    left neck   Hypertension    STEMI (ST elevation myocardial infarction) (HCC) 03/05/2017   hx/notes 03/05/2017 -likely aborted anterior STEMI with 50% bifurcation LAD-Diag1.  No PCI.  Preserved EF   Syncope due to orthostatic hypotension 04/21/2018    Past Surgical History:  Procedure  Laterality Date   BIOPSY  10/10/2020   Procedure: BIOPSY;  Surgeon: Dolores Frame, MD;  [facility];  Service: Gastroenterology;;   BIOPSY  11/14/2020   Procedure: BIOPSY;  Surgeon: Dolores Frame, MD;  [facility];  Service: Gastroenterology;;   COLONOSCOPY N/A 10/11/2015   Procedure: COLONOSCOPY;  Surgeon: Malissa Hippo, MD;  [facility];  Service: Endoscopy;  Laterality: N/A;  10/11/2015   COLONOSCOPY WITH PROPOFOL N/A 10/10/2020   Procedure: COLONOSCOPY WITH PROPOFOL;  Surgeon: Dolores Frame, MD;  [facility];  Service: Gastroenterology;  Laterality: N/A;  AM   ENTEROSCOPY N/A 11/14/2020   Procedure: push enteroscopy;  Surgeon: Dolores Frame, MD;  [facility];  Service: Gastroenterology;  Laterality: N/A;   ESOPHAGOGASTRODUODENOSCOPY (EGD) WITH PROPOFOL N/A 10/10/2020   Procedure: ESOPHAGOGASTRODUODENOSCOPY (EGD) WITH PROPOFOL;  Surgeon: Dolores Frame, MD;  [facility];  Service: Gastroenterology;  Laterality: N/A;   ESOPHAGOGASTRODUODENOSCOPY (EGD) WITH PROPOFOL N/A 11/14/2020   Procedure: ESOPHAGOGASTRODUODENOSCOPY (EGD) WITH PROPOFOL;  Surgeon: Dolores Frame, MD;  [facility];  Service: Gastroenterology;  Laterality: N/A;  12:00   GIVENS CAPSULE STUDY N/A 11/07/2020   Procedure: GIVENS CAPSULE STUDY;  Surgeon: Dolores Frame, MD;  [facility];  Service: Gastroenterology;  Laterality: N/A;  7:30 am   Graded Exercise Tolerance Test (GXT/ETT)  05/2017   10.7 METs (9: 25 min.  Reached 103% max predicted heart rate).  No EKG findings  to suggest coronary ischemia.  Negative, low risk GXT   HEMOSTASIS CLIP PLACEMENT  11/14/2020   Procedure: HEMOSTASIS CLIP PLACEMENT;  Surgeon: Dolores Frame, MD;  [facility];  Service: Gastroenterology;;  small bowel nodule   LEFT HEART CATH AND CORONARY ANGIOGRAPHY N/A  03/05/2017   Procedure: LEFT HEART CATH AND CORONARY ANGIOGRAPHY;  Surgeon: Marykay Lex, MD;  Location: [facility] INVASIVE CV LAB;  Service: Cardiovascular: pLAD-Diag1 50-55% (non-flow-limiiting).  EF ~50-55%.  although bifurcation lesion was presumed Culprit - no PCI (not flow limiting). - Med Rx.   MASS BIOPSY Left 06/27/2015   Procedure: OPEN LEFT NECK BIOPSY ;  Surgeon: Newman Pies, MD;  Location: [facility];  Service: ENT;  Laterality: Left;   POLYPECTOMY  10/10/2020   Procedure: POLYPECTOMY;  Surgeon: Dolores Frame, MD;  [facility];  Service: Gastroenterology;;   REFRACTIVE SURGERY Bilateral      reports that he has quit smoking. His smoking use included cigarettes. He has quit using smokeless tobacco.  His smokeless tobacco use included chew and snuff. He reports current alcohol use of about 2.0 standard drinks of alcohol per week. He reports that he does not use drugs.  No Known Allergies  Family History  Problem Relation Age of Onset   Diabetes Mother    Cancer Father    Cancer Brother     Prior to Admission medications   Medication Sig Start Date End Date Taking? Authorizing Provider  aspirin 81 MG chewable tablet Chew 1 tablet (81 mg total) by mouth every other day. 06/20/17  Yes Marykay Lex, MD  atorvastatin (LIPITOR) 40 MG tablet KEEP OV. Patient taking differently: Take 40 mg by mouth daily. KEEP OV. 11/13/22  Yes Jodelle Gross, NP  cholecalciferol (VITAMIN D3) 25 MCG (1000 UNIT) tablet Take 10,000 Units by mouth daily. 10/09/20  Yes [provider]  metoprolol tartrate (LOPRESSOR) 25 MG tablet Take 1 tablet (25 mg total) by mouth in the morning AND 0.5 tablets (12.5 mg total) every evening. 09/24/22  Yes Anabel Halon, MD  telmisartan (MICARDIS) 20 MG tablet Take 1 tablet (20 mg total) by mouth daily. 01/28/23  Yes Anabel Halon, MD  traMADol (ULTRAM) 50 MG tablet Take 1 tablet (50 mg total) by mouth every 6 (six) hours as  needed. 02/10/23  Yes Doreatha Massed, MD  ferrous sulfate 324 (65 Fe) MG TBEC Take 324 mg by mouth every other day.    [provider]    Physical Exam: Vitals:   02/12/23 1426 02/12/23 1451 02/12/23 1452 02/12/23 1500  BP:   104/65 117/64  Pulse:  78  77  Resp:  17  18  Temp: 97.7 F (36.5 C)   97.9 F (36.6 C)  TempSrc: Oral   Oral  SpO2:  93%  92%  Weight:      Height:        Constitutional: NAD, calm, comfortable, dry mucus membranes.  Eyes: PERRL, lids and conjunctivae normal ENMT: Mucous membranes are dry Posterior pharynx clear of any exudate or lesions.Normal dentition.  Neck: normal, supple, no masses, no thyromegaly Respiratory: clear to auscultation bilaterally, no wheezing, no crackles. Normal respiratory effort. No accessory muscle use.  Cardiovascular: normal s1, s2 sounds, no murmurs / rubs / gallops. No extremity edema. 2+ pedal pulses. No carotid bruits.  Abdomen: no tenderness, no masses palpated. No hepatosplenomegaly. Bowel sounds positive.  Musculoskeletal: no clubbing / cyanosis. No joint deformity upper and lower  extremities. Good ROM, no contractures. Normal muscle tone.  Skin: no rashes, lesions, ulcers. No induration Neurologic: CN 2-12 grossly intact. Sensation intact, DTR normal. Strength 5/5 in all 4.  Psychiatric: Normal judgment and insight. Alert and oriented x 3. Normal mood.   Labs on Admission: I have personally reviewed following labs and imaging studies  CBC: Recent Labs  Lab 02/12/23 0919 02/12/23 1054  WBC 7.6 7.5  NEUTROABS 5.1 5.7  HGB 14.0 12.8*  HCT 41.9 37.9*  MCV 97.4 96.9  PLT 157 149*   Basic Metabolic Panel: Recent Labs  Lab 02/12/23 0919 02/12/23 1054  NA 133* 131*  K 3.6 3.5  CL 96* 96*  CO2 22 22  GLUCOSE 118* 114*  BUN 24* 25*  CREATININE 1.30* 1.48*  CALCIUM 9.4 8.9  MG 1.9  --    GFR: Estimated Creatinine Clearance: 70.9 mL/min (A) (by C-G formula based on SCr of 1.48 mg/dL (H)). Liver  Function Tests: Recent Labs  Lab 02/12/23 0919 02/12/23 1054  AST 49* 45*  ALT 49* 45*  ALKPHOS 107 96  BILITOT 4.6* 3.7*  PROT 6.6 5.8*  ALBUMIN 3.9 3.4*   No results for input(s): "LIPASE", "AMYLASE" in the last 168 hours. No results for input(s): "AMMONIA" in the last 168 hours. Coagulation Profile: Recent Labs  Lab 02/12/23 1054  INR 1.2   Cardiac Enzymes: No results for input(s): "CKTOTAL", "CKMB", "CKMBINDEX", "TROPONINI" in the last 168 hours. BNP (last 3 results) No results for input(s): "PROBNP" in the last 8760 hours. HbA1C: No results for input(s): "HGBA1C" in the last 72 hours. CBG: No results for input(s): "GLUCAP" in the last 168 hours. Lipid Profile: No results for input(s): "CHOL", "HDL", "LDLCALC", "TRIG", "CHOLHDL", "LDLDIRECT" in the last 72 hours. Thyroid Function Tests: No results for input(s): "TSH", "T4TOTAL", "FREET4", "T3FREE", "THYROIDAB" in the last 72 hours. Anemia Panel: No results for input(s): "VITAMINB12", "FOLATE", "FERRITIN", "TIBC", "IRON", "RETICCTPCT" in the last 72 hours. Urine analysis:    Component Value Date/Time   COLORURINE YELLOW 06/29/2018 0854   APPEARANCEUR CLEAR 06/29/2018 0854   LABSPEC 1.018 06/29/2018 0854   PHURINE 5.0 06/29/2018 0854   GLUCOSEU NEGATIVE 06/29/2018 0854   HGBUR SMALL (A) 06/29/2018 0854   BILIRUBINUR NEGATIVE 06/29/2018 0854   KETONESUR NEGATIVE 06/29/2018 0854   PROTEINUR NEGATIVE 06/29/2018 0854   NITRITE NEGATIVE 06/29/2018 0854   LEUKOCYTESUR NEGATIVE 06/29/2018 0854    Radiological Exams on Admission: CT ABDOMEN PELVIS W CONTRAST  Result Date: 02/12/2023 CLINICAL DATA:  History of small lymphocytic lymphoma with progression on recent PET-CT. Right lower quadrant abdominal pain. 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 * Tracking Code: BO * EXAM: CT ABDOMEN AND PELVIS WITH CONTRAST TECHNIQUE: Multidetector CT imaging of the abdomen and pelvis was performed using the standard protocol following  bolus administration of intravenous contrast. RADIATION DOSE REDUCTION: This exam was performed according to the departmental dose-optimization program which includes automated exposure control, adjustment of the mA and/or kV according to patient size and/or use of iterative reconstruction technique. CONTRAST:  OMNIPAQUE IOHEXOL 300 MG/ML  SOLN COMPARISON:  PET-CT 02/06/2023. CT of the chest, abdomen and pelvis 01/24/2023 and 04/11/2022. FINDINGS: Lower chest: Stable linear atelectasis or scarring at both lung bases. Small pulmonary nodules bilaterally are unchanged from recent prior imaging, measuring up to 8 mm in the right lower lobe and 9 mm in the left lower lobe, both on image 9/5. There is progressive subcarinal and distal paraesophageal adenopathy as seen on recent PET-CT. A distal right paraesophageal  node measures up to 5.4 x 4.6 cm on image 14/2. Hepatobiliary: The liver is normal in density without suspicious focal abnormality. No evidence of gallstones, gallbladder wall thickening or biliary dilatation. Pancreas: Unremarkable. No pancreatic ductal dilatation or surrounding inflammatory changes. Spleen: Progressive splenomegaly compared with recent CT. No focal abnormality identified. Adrenals/Urinary Tract: Both adrenal glands appear normal. No evidence of urinary tract calculus, suspicious renal lesion or hydronephrosis. Unchanged cyst in the mid left kidney for which no follow-up imaging is recommended. Stable bladder compression by the bilateral pelvic lymphadenopathy. No intrinsic bladder abnormality identified. Stomach/Bowel: No enteric contrast administered. The stomach appears unremarkable for its degree of distension. No evidence of bowel wall thickening, distention or surrounding inflammatory change. Vascular/Lymphatic: Again demonstrated is extensive retroperitoneal, pelvic and bilateral inguinal adenopathy. No significant change is seen from the recent PET-CT, although some of the lymph  nodes have enlarged from the diagnostic CT done 01/24/2023. For example, there is a left external iliac node measuring 4.5 cm short axis on image 86/2 which previously measured 3.7 cm. A presacral node measuring 3.1 cm on image 72/2 previously measured 2.3 cm. No nodal necrosis identified. Mild aortic and branch vessel atherosclerosis without evidence of aneurysm or large vessel occlusion. Reproductive: The prostate gland appears mildly enlarged, but stable. Other: No evidence of abdominal wall mass or hernia. No ascites or pneumoperitoneum. Musculoskeletal: No acute or significant osseous findings. There is multilevel spondylosis with a grade 1 anterolisthesis at L5-S1 secondary to underlying pars defects. Associated prominent foraminal narrowing in the lower lumbar spine, unchanged. Unless specific follow-up recommendations are mentioned in the findings or impression sections, no imaging follow-up of any mentioned incidental findings is recommended. IMPRESSION: 1. Known extensive adenopathy in the lower chest, abdomen and pelvis has mildly progressed compared with the CTs of less than 3 weeks earlier consistent with progressive lymphoma. 2. Progressive splenomegaly. 3. Stable small pulmonary nodules at both lung bases. 4. No definite acute findings. 5. 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 Associated prominent foraminal narrowing in the lower lumbar spine. 6.  Aortic Atherosclerosis (ICD10-I70.0). Electronically Signed   By: Carey Bullocks M.D.   On: 02/12/2023 12:16   DG Chest Port 1 View  Result Date: 02/12/2023 CLINICAL DATA:  Sepsis.  History of lymphoma EXAM: PORTABLE CHEST 1 VIEW COMPARISON:  PET-CT 02/06/2023 FINDINGS: Subtle bandlike opacity right lung base. Atelectasis is favored. No consolidation, pneumothorax or effusion. No edema. Normal cardiopericardial silhouette. Overlapping cardiac leads. IMPRESSION: Right basilar atelectasis.   No pneumothorax or effusion. Electronically Signed   By: Karen Kays M.D.   On: 02/12/2023 11:29    Assessment/Plan Principal Problem:   Hypotension Active Problems:   Question of Richter's transformation   AKI (acute kidney injury) (HCC)   Chronic lymphocytic leukemia (CLL), B-cell (HCC)   Coronary artery disease involving native coronary artery of native heart with angina pectoris (HCC)   Dyslipidemia, goal LDL below 70   Lymphoma, small lymphocytic (HCC)   Frequent PVCs   Poor appetite   Low energy   Elevated lactic acid level   Dehydration   Hyponatremia   Elevated liver enzymes   Hyperglycemia   Acute Kidney Injury - this is prerenal given his significant clinical dehydration - treating with IV fluid hydration  - recheck CMP in AM   Hypotension  - secondary to severe dehydration - BP improving with IV fluids  - continue IV fluids - holding some home BP meds and added holding  parameters for reduced dose metoprolol  - follow   CLL with small lymphocytic lymphoma  - [facility]13/[facility]  - concern for Richter's transformation - further recommendations to follow after biopsy completed - pt has been out of his ibrutinib for last 4 days but his treatment course is likely to change per Dr. Ellin Saba   Dehydration - continue IV fluid hydration   Chronic pain  - pain and nausea medication ordered as needed  Lactic Acidosis - resolved after IV fluids given   CAD - resumed home  metoprolol but reduced dose to 12.5 mg BID with holding parameters - holding daily aspirin 81 mg until after biopsy has been completed   Dyslipidemia - resuming home daily atorvastatin   Hyponatremia - likely from dehydration and SIADH of malignancy - following CMP with IV fluid hydration  Elevated liver Enzymes/mild hyperbilirubinemia - possibly from ibrutinib - recheck CMP in AM after IV fluid hydration    DVT  prophylaxis: SCDs until after biopsy completed   Code Status: Full   Family Communication: wife at bedside   Disposition Plan: home   Consults called: oncology, Interventional radiology   Admission status: IP  Level of care: Telemetry Standley Dakins MD [facility] How to contact the [facility] Attending or Consulting provider 7A - 7P or covering provider during after hours 7P -7A, for this patient?  Check the care team in [facility] and look for a) attending/consulting [facility] provider listed and b) the [facility] team listed Log into [email or web address] and use Rachel's universal password to access. If you do not have the password, please contact the hospital operator. Locate the [facility] provider you are looking for under [facility] and page to a number that you can be directly reached. If you still have difficulty reaching the provider, please page the [facility] (Director on Call) for the Hospitalists listed on amion for assistance.   If 7PM-7AM, please contact night-coverage [email or web address] Password [facility]  02/12/2023, 3:34 PM

## 2023-02-13 ENCOUNTER — Other Ambulatory Visit: Payer: Self-pay

## 2023-02-13 ENCOUNTER — Ambulatory Visit (HOSPITAL_COMMUNITY)
Admit: 2023-02-13 | Discharge: 2023-02-13 | Disposition: A | Discharge status: Home / Self Care | Payer: PPO | Attending: Family Medicine | Admitting: Family Medicine

## 2023-02-13 ENCOUNTER — Encounter (HOSPITAL_COMMUNITY): Payer: Self-pay | Admitting: Family Medicine

## 2023-02-13 ENCOUNTER — Encounter (HOSPITAL_COMMUNITY): Payer: Self-pay

## 2023-02-13 ENCOUNTER — Observation Stay (HOSPITAL_COMMUNITY)
Admit: 2023-02-13 | Discharge: 2023-02-13 | Disposition: A | Discharge status: Home / Self Care | Payer: PPO | Attending: Hematology | Admitting: Hematology

## 2023-02-13 DIAGNOSIS — R5383 Other fatigue: Secondary | ICD-10-CM | POA: Diagnosis not present

## 2023-02-13 DIAGNOSIS — E876 Hypokalemia: Secondary | ICD-10-CM | POA: Diagnosis not present

## 2023-02-13 DIAGNOSIS — I10 Essential (primary) hypertension: Secondary | ICD-10-CM | POA: Diagnosis not present

## 2023-02-13 DIAGNOSIS — D849 Immunodeficiency, unspecified: Secondary | ICD-10-CM | POA: Diagnosis not present

## 2023-02-13 DIAGNOSIS — R591 Generalized enlarged lymph nodes: Secondary | ICD-10-CM | POA: Diagnosis not present

## 2023-02-13 DIAGNOSIS — C9110 Chronic lymphocytic leukemia of B-cell type not having achieved remission: Secondary | ICD-10-CM | POA: Diagnosis not present

## 2023-02-13 DIAGNOSIS — R590 Localized enlarged lymph nodes: Secondary | ICD-10-CM | POA: Diagnosis not present

## 2023-02-13 DIAGNOSIS — R0989 Other specified symptoms and signs involving the circulatory and respiratory systems: Secondary | ICD-10-CM

## 2023-02-13 DIAGNOSIS — I959 Hypotension, unspecified: Secondary | ICD-10-CM | POA: Diagnosis not present

## 2023-02-13 DIAGNOSIS — E871 Hypo-osmolality and hyponatremia: Secondary | ICD-10-CM | POA: Diagnosis not present

## 2023-02-13 DIAGNOSIS — N179 Acute kidney failure, unspecified: Secondary | ICD-10-CM | POA: Diagnosis not present

## 2023-02-13 DIAGNOSIS — R7989 Other specified abnormal findings of blood chemistry: Secondary | ICD-10-CM | POA: Diagnosis not present

## 2023-02-13 DIAGNOSIS — R748 Abnormal levels of other serum enzymes: Secondary | ICD-10-CM | POA: Diagnosis not present

## 2023-02-13 DIAGNOSIS — I25119 Atherosclerotic heart disease of native coronary artery with unspecified angina pectoris: Secondary | ICD-10-CM | POA: Diagnosis not present

## 2023-02-13 DIAGNOSIS — E872 Acidosis, unspecified: Secondary | ICD-10-CM | POA: Diagnosis not present

## 2023-02-13 DIAGNOSIS — R531 Weakness: Secondary | ICD-10-CM | POA: Diagnosis not present

## 2023-02-13 DIAGNOSIS — E669 Obesity, unspecified: Secondary | ICD-10-CM | POA: Diagnosis not present

## 2023-02-13 DIAGNOSIS — N390 Urinary tract infection, site not specified: Secondary | ICD-10-CM | POA: Diagnosis not present

## 2023-02-13 DIAGNOSIS — I4819 Other persistent atrial fibrillation: Secondary | ICD-10-CM | POA: Diagnosis not present

## 2023-02-13 DIAGNOSIS — K5730 Diverticulosis of large intestine without perforation or abscess without bleeding: Secondary | ICD-10-CM | POA: Diagnosis not present

## 2023-02-13 DIAGNOSIS — E222 Syndrome of inappropriate secretion of antidiuretic hormone: Secondary | ICD-10-CM | POA: Diagnosis not present

## 2023-02-13 DIAGNOSIS — I471 Supraventricular tachycardia, unspecified: Secondary | ICD-10-CM | POA: Diagnosis not present

## 2023-02-13 DIAGNOSIS — R739 Hyperglycemia, unspecified: Secondary | ICD-10-CM | POA: Diagnosis not present

## 2023-02-13 DIAGNOSIS — Z79899 Other long term (current) drug therapy: Secondary | ICD-10-CM | POA: Diagnosis not present

## 2023-02-13 DIAGNOSIS — N281 Cyst of kidney, acquired: Secondary | ICD-10-CM | POA: Diagnosis not present

## 2023-02-13 DIAGNOSIS — D6959 Other secondary thrombocytopenia: Secondary | ICD-10-CM | POA: Diagnosis not present

## 2023-02-13 DIAGNOSIS — I4891 Unspecified atrial fibrillation: Secondary | ICD-10-CM | POA: Diagnosis present

## 2023-02-13 DIAGNOSIS — R161 Splenomegaly, not elsewhere classified: Secondary | ICD-10-CM | POA: Diagnosis not present

## 2023-02-13 DIAGNOSIS — Z7901 Long term (current) use of anticoagulants: Secondary | ICD-10-CM | POA: Diagnosis not present

## 2023-02-13 DIAGNOSIS — I351 Nonrheumatic aortic (valve) insufficiency: Secondary | ICD-10-CM | POA: Diagnosis not present

## 2023-02-13 DIAGNOSIS — I9589 Other hypotension: Secondary | ICD-10-CM | POA: Diagnosis not present

## 2023-02-13 DIAGNOSIS — D630 Anemia in neoplastic disease: Secondary | ICD-10-CM | POA: Diagnosis not present

## 2023-02-13 DIAGNOSIS — I252 Old myocardial infarction: Secondary | ICD-10-CM | POA: Diagnosis not present

## 2023-02-13 DIAGNOSIS — E860 Dehydration: Secondary | ICD-10-CM | POA: Diagnosis not present

## 2023-02-13 DIAGNOSIS — R630 Anorexia: Secondary | ICD-10-CM | POA: Diagnosis not present

## 2023-02-13 DIAGNOSIS — C8300 Small cell B-cell lymphoma, unspecified site: Secondary | ICD-10-CM | POA: Diagnosis not present

## 2023-02-13 DIAGNOSIS — I2510 Atherosclerotic heart disease of native coronary artery without angina pectoris: Secondary | ICD-10-CM | POA: Diagnosis not present

## 2023-02-13 DIAGNOSIS — E785 Hyperlipidemia, unspecified: Secondary | ICD-10-CM | POA: Diagnosis not present

## 2023-02-13 DIAGNOSIS — G8929 Other chronic pain: Secondary | ICD-10-CM | POA: Diagnosis not present

## 2023-02-13 DIAGNOSIS — C8590 Non-Hodgkin lymphoma, unspecified, unspecified site: Secondary | ICD-10-CM | POA: Diagnosis not present

## 2023-02-13 DIAGNOSIS — I493 Ventricular premature depolarization: Secondary | ICD-10-CM | POA: Diagnosis not present

## 2023-02-13 LAB — CBC
HCT: 35.4 % — ABNORMAL LOW (ref 39.0–52.0)
Hemoglobin: 11.8 g/dL — ABNORMAL LOW (ref 13.0–17.0)
MCH: 32.6 pg (ref 26.0–34.0)
MCHC: 33.3 g/dL (ref 30.0–36.0)
MCV: 97.8 fL (ref 80.0–100.0)
Platelets: 120 K/uL — ABNORMAL LOW (ref 150–400)
RBC: 3.62 MIL/uL — ABNORMAL LOW (ref 4.22–5.81)
RDW: 14 % (ref 11.5–15.5)
WBC: 7 K/uL (ref 4.0–10.5)
nRBC: 0 % (ref 0.0–0.2)

## 2023-02-13 LAB — COMPREHENSIVE METABOLIC PANEL WITH GFR
ALT: 44 U/L (ref 0–44)
AST: 41 U/L (ref 15–41)
Albumin: 3.1 g/dL — ABNORMAL LOW (ref 3.5–5.0)
Alkaline Phosphatase: 96 U/L (ref 38–126)
Anion gap: 13 (ref 5–15)
BUN: 28 mg/dL — ABNORMAL HIGH (ref 8–23)
CO2: 23 mmol/L (ref 22–32)
Calcium: 8.8 mg/dL — ABNORMAL LOW (ref 8.9–10.3)
Chloride: 98 mmol/L (ref 98–111)
Creatinine, Ser: 1.15 mg/dL (ref 0.61–1.24)
GFR, Estimated: >60 mL/min
Glucose, Bld: 91 mg/dL (ref 70–99)
Potassium: 3 mmol/L — ABNORMAL LOW (ref 3.5–5.1)
Sodium: 134 mmol/L — ABNORMAL LOW (ref 135–145)
Total Bilirubin: 2.6 mg/dL — ABNORMAL HIGH (ref 0.3–1.2)
Total Protein: 5.3 g/dL — ABNORMAL LOW (ref 6.5–8.1)

## 2023-02-13 LAB — MAGNESIUM: Magnesium: 2 mg/dL (ref 1.7–2.4)

## 2023-02-13 LAB — GLUCOSE, CAPILLARY: Glucose-Capillary: 94 mg/dL (ref 70–99)

## 2023-02-13 MED ORDER — SODIUM CHLORIDE 0.9 % IV SOLN: Freq: Once | INTRAVENOUS | Status: AC

## 2023-02-13 MED ORDER — AMIODARONE HCL IN DEXTROSE 360-4.14 MG/200ML-% IV SOLN
60.0000 mg/h | INTRAVENOUS | Status: DC
  Filled 2023-02-13: qty 200

## 2023-02-13 MED ORDER — SODIUM CHLORIDE 0.9 % IV BOLUS
1000.0000 mL | Freq: Once | INTRAVENOUS | Status: AC
  Administered 2023-02-13: 1000 mL via INTRAVENOUS

## 2023-02-13 MED ORDER — AMIODARONE HCL IN DEXTROSE 360-4.14 MG/200ML-% IV SOLN: 30.0000 mg/h | INTRAVENOUS | Status: DC

## 2023-02-13 MED ORDER — METOPROLOL TARTRATE 5 MG/5ML IV SOLN: 5.0000 mg | Freq: Once | INTRAVENOUS | Status: DC

## 2023-02-13 MED ORDER — MIDAZOLAM HCL 2 MG/2ML IJ SOLN
INTRAMUSCULAR | Status: AC
  Administered 2023-02-13: 1 mg
  Filled 2023-02-13: qty 2

## 2023-02-13 MED ORDER — POTASSIUM CHLORIDE 10 MEQ/100ML IV SOLN
10.0000 meq | INTRAVENOUS | Status: DC
  Administered 2023-02-13 (×2): 10 meq via INTRAVENOUS
  Filled 2023-02-13 (×2): qty 100

## 2023-02-13 MED ORDER — DILTIAZEM LOAD VIA INFUSION
20.0000 mg | Freq: Once | INTRAVENOUS | Status: AC
  Administered 2023-02-13: 20 mg via INTRAVENOUS
  Filled 2023-02-13: qty 20

## 2023-02-13 MED ORDER — MIDAZOLAM HCL 2 MG/2ML IJ SOLN
INTRAMUSCULAR | Status: AC
  Filled 2023-02-13: qty 2

## 2023-02-13 MED ORDER — ENOXAPARIN SODIUM 120 MG/0.8ML IJ SOSY: 120.0000 mg | PREFILLED_SYRINGE | Freq: Two times a day (BID) | INTRAMUSCULAR | Status: DC

## 2023-02-13 MED ORDER — SODIUM CHLORIDE 0.9 % IV BOLUS
250.0000 mL | Freq: Once | INTRAVENOUS | Status: AC
  Administered 2023-02-13: 250 mL via INTRAVENOUS

## 2023-02-13 MED ORDER — FENTANYL CITRATE (PF) 100 MCG/2ML IJ SOLN
INTRAMUSCULAR | Status: AC
  Filled 2023-02-13: qty 2

## 2023-02-13 MED ORDER — DILTIAZEM HCL-DEXTROSE 125-5 MG/125ML-% IV SOLN (PREMIX)
5.0000 mg/h | INTRAVENOUS | Status: DC
  Administered 2023-02-13: 5 mg/h via INTRAVENOUS
  Filled 2023-02-13: qty 125

## 2023-02-13 MED ORDER — ENOXAPARIN SODIUM 120 MG/0.8ML IJ SOSY
120.0000 mg | PREFILLED_SYRINGE | Freq: Once | INTRAMUSCULAR | Status: AC
  Administered 2023-02-13: 120 mg via SUBCUTANEOUS
  Filled 2023-02-13: qty 0.8

## 2023-02-13 MED ORDER — AMIODARONE LOAD VIA INFUSION
150.0000 mg | Freq: Once | INTRAVENOUS | Status: AC
  Administered 2023-02-13: 150 mg via INTRAVENOUS
  Filled 2023-02-13: qty 83.34

## 2023-02-13 MED ORDER — METOPROLOL TARTRATE 5 MG/5ML IV SOLN: 2.5000 mg | Freq: Once | INTRAVENOUS | Status: DC

## 2023-02-13 MED ORDER — FENTANYL CITRATE PF 50 MCG/ML IJ SOSY
PREFILLED_SYRINGE | INTRAMUSCULAR | Status: AC
  Filled 2023-02-13: qty 1

## 2023-02-13 MED ORDER — METOPROLOL TARTRATE 25 MG PO TABS: 12.5000 mg | ORAL_TABLET | Freq: Once | ORAL | Status: DC

## 2023-02-13 MED ORDER — MIDAZOLAM HCL 2 MG/2ML IJ SOLN
INTRAMUSCULAR | Status: AC
  Administered 2023-02-13: 2 mg
  Filled 2023-02-13: qty 2

## 2023-02-13 MED ORDER — POTASSIUM CHLORIDE CRYS ER 20 MEQ PO TBCR
40.0000 meq | EXTENDED_RELEASE_TABLET | Freq: Once | ORAL | Status: AC
  Administered 2023-02-13: 40 meq via ORAL
  Filled 2023-02-13: qty 2

## 2023-02-13 NOTE — Sedation Documentation (Signed)
Bp 99/75; pt pale, diaphoretic;denies cp, denies sob; Dr Fredia Sorrow notified

## 2023-02-13 NOTE — Sedation Documentation (Signed)
Pt alert, resting comfortably; no needs at this time; call bell within reach

## 2023-02-13 NOTE — Progress Notes (Signed)
ANTICOAGULATION CONSULT NOTE - Initial Consult  Pharmacy Consult for enoxaparin Indication: atrial fibrillation  No Known Allergies  Patient Measurements: Height: 6\' 4"  (193 cm) Weight: 121.6 kg (268 lb) IBW/kg (Calculated) : 86.8   Vital Signs: Temp: 97.6 F (36.4 C) (08/01 1915) Temp Source: Oral (08/01 1915) BP: 76/56 (08/01 2130) Pulse Rate: 142 (08/01 2109)  Labs: Recent Labs    02/12/23 0919 02/12/23 1054 02/13/23 0402  HGB 14.0 12.8* 11.8*  HCT 41.9 37.9* 35.4*  PLT 157 149* 120*  APTT  --  28  --   LABPROT  --  15.5*  --   INR  --  1.2  --   CREATININE 1.30* 1.48* 1.15    Estimated Creatinine Clearance: 91.2 mL/min (by C-G formula based on SCr of 1.15 mg/dL).   Medical History: Past Medical History:  Diagnosis Date   Chronic lymphocytic leukemia (CLL), B-cell (HCC)    Small Cell Lymphoma   Coronary artery disease, non-occlusive 02/2017   Cardiac cath in setting of MI: 50 and 55% bifurcation LAD-Diag1   Enlarged lymph node    left neck   Hypertension    STEMI (ST elevation myocardial infarction) (HCC) 03/05/2017   hx/notes 03/05/2017 -likely aborted anterior STEMI with 50% bifurcation LAD-Diag1.  No PCI.  Preserved EF   Syncope due to orthostatic hypotension 04/21/2018     Assessment: 67 yo M in Afib RVR getting DCCV 8/1. Pharmacy consulted for enoxaparin.   Goal of Therapy:  Monitor platelets by anticoagulation protocol: Yes   Plan:  Enoxaparin 120mg  q12hr  Monitor signs/symptoms of bleeding   Alphia Moh, PharmD, BCPS, BCCP Clinical Pharmacist  Please check AMION for all [facility] phone numbers After 10:00 PM, call Main Pharmacy [phone]

## 2023-02-13 NOTE — ED Triage Notes (Signed)
Pt went to [place] yesterday for neaar syncope. Pt had lymph node biopsy today and became pale, diaphoretic, hypotensive. Pt was in afib at 150. Axox4

## 2023-02-13 NOTE — Consult Note (Signed)
Cardiology Consultation   Patient ID: Adam Reyes MRN: [id number]; DOB: August 14, 1955  Admit date: 02/12/2023 Date of Consult: 02/13/2023  PCP:  Anabel Halon, MD   [facility] Providers Cardiologist:  Bryan Lemma, MD        Patient Profile:   Adam Reyes is a 67 y.o. male with a hx of nonobstructive CAD, CLL who is being seen 02/13/2023 for the evaluation of afib w/ RVR at the request of hospital medicine svc.  History of Present Illness:   Mr. Baab has h/o nonobstructive CAD and was originally admitted to the hospital for biopsy related to his CLL. The procedure was aborted after he went into new-dx AF with RVR earlier today. Onset of AF documented to be <24h from time of consult.  Cardiology called this evening due to difficulty of controlling pt's HR. Pt had been hypotensive at baseline, and then cardizem gtt was started in effort to control HR (AF running 140s-150s). BP then bottomed out. When I first encountered pt, BP was 70s/50s with HR in 150s-160s. I recommended amiodarone bolus; that was ineffective at restoring NSR.  We decided to proceed w/ DCCV given pt's hemodynamic instability. DCCV was successful (see separate procedure note) with conversion to NSR in 80s. BP up to low 100s systolic.   Past Medical History:  Diagnosis Date   Chronic lymphocytic leukemia (CLL), B-cell (HCC)    Small Cell Lymphoma   Coronary artery disease, non-occlusive 02/2017   Cardiac cath in setting of MI: 50 and 55% bifurcation LAD-Diag1   Enlarged lymph node    left neck   Hypertension    STEMI (ST elevation myocardial infarction) (HCC) 03/05/2017   hx/notes 03/05/2017 -likely aborted anterior STEMI with 50% bifurcation LAD-Diag1.  No PCI.  Preserved EF   Syncope due to orthostatic hypotension 04/21/2018    Past Surgical History:  Procedure Laterality Date   BIOPSY  10/10/2020   Procedure: BIOPSY;  Surgeon: Dolores Frame, MD;  [facility];  Service:  Gastroenterology;;   BIOPSY  11/14/2020   Procedure: BIOPSY;  Surgeon: Dolores Frame, MD;  [facility];  Service: Gastroenterology;;   COLONOSCOPY N/A 10/11/2015   Procedure: COLONOSCOPY;  Surgeon: Malissa Hippo, MD;  [facility];  Service: Endoscopy;  Laterality: N/A;  10/11/2015   COLONOSCOPY WITH PROPOFOL N/A 10/10/2020   Procedure: COLONOSCOPY WITH PROPOFOL;  Surgeon: Dolores Frame, MD;  [facility];  Service: Gastroenterology;  Laterality: N/A;  AM   ENTEROSCOPY N/A 11/14/2020   Procedure: push enteroscopy;  Surgeon: Dolores Frame, MD;  [facility];  Service: Gastroenterology;  Laterality: N/A;   ESOPHAGOGASTRODUODENOSCOPY (EGD) WITH PROPOFOL N/A 10/10/2020   Procedure: ESOPHAGOGASTRODUODENOSCOPY (EGD) WITH PROPOFOL;  Surgeon: Dolores Frame, MD;  [facility];  Service: Gastroenterology;  Laterality: N/A;   ESOPHAGOGASTRODUODENOSCOPY (EGD) WITH PROPOFOL N/A 11/14/2020   Procedure: ESOPHAGOGASTRODUODENOSCOPY (EGD) WITH PROPOFOL;  Surgeon: Dolores Frame, MD;  [facility];  Service: Gastroenterology;  Laterality: N/A;  12:00   GIVENS CAPSULE STUDY N/A 11/07/2020   Procedure: GIVENS CAPSULE STUDY;  Surgeon: Dolores Frame, MD;  [facility];  Service: Gastroenterology;  Laterality: N/A;  7:30 am   Graded Exercise Tolerance Test (GXT/ETT)  05/2017   10.7 METs (9: 25 min.  Reached 103% max predicted heart rate).  No EKG findings to suggest coronary ischemia.  Negative, low risk GXT   HEMOSTASIS CLIP PLACEMENT  11/14/2020   Procedure: HEMOSTASIS CLIP PLACEMENT;  Surgeon: Dolores Frame, MD;  [facility];  Service: Gastroenterology;;  small bowel nodule   LEFT HEART CATH AND CORONARY ANGIOGRAPHY N/A 03/05/2017   Procedure: LEFT HEART CATH AND CORONARY ANGIOGRAPHY;  Surgeon: Marykay Lex, MD;  Location: [facility] IN[facility]ASIVE CV [facility];  Service:  Cardiovascular: pLAD-Diag1 50-55% (non-flow-limiiting).  EF ~50-55%.  although bifurcation lesion was presumed Culprit - no PCI (not flow limiting). - Med Rx.   MASS BIOPSY Left 06/27/2015   Procedure: OPEN LEFT NECK BIOPSY ;  Surgeon: Newman Pies, MD;  Location: [facility];  Service: ENT;  Laterality: Left;   POLYPECTOMY  10/10/2020   Procedure: POLYPECTOMY;  Surgeon: Dolores Frame, MD;  [facility];  Service: Gastroenterology;;   REFRACTIVE SURGERY Bilateral      Home Medications:  Prior to Admission medications   Medication Sig Start Date End Date Taking? Authorizing Provider  aspirin 81 MG chewable tablet Chew 1 tablet (81 mg total) by mouth every other day. 06/20/17  Yes Marykay Lex, MD  atorvastatin (LIPITOR) 40 MG tablet KEEP OV. Patient taking differently: Take 40 mg by mouth daily. KEEP OV. 11/13/22  Yes Jodelle Gross, NP  cholecalciferol (VITAMIN D3) 25 MCG (1000 UNIT) tablet Take 10,000 Units by mouth daily. 10/09/20  Yes [provider]  metoprolol tartrate (LOPRESSOR) 25 MG tablet Take 1 tablet (25 mg total) by mouth in the morning AND 0.5 tablets (12.5 mg total) every evening. 09/24/22  Yes Anabel Halon, MD  telmisartan (MICARDIS) 20 MG tablet Take 1 tablet (20 mg total) by mouth daily. 01/28/23  Yes Anabel Halon, MD  traMADol (ULTRAM) 50 MG tablet Take 1 tablet (50 mg total) by mouth every 6 (six) hours as needed. 02/10/23  Yes Doreatha Massed, MD  ferrous sulfate 324 (65 Fe) MG TBEC Take 324 mg by mouth every other day.    [provider]    Inpatient Medications: Scheduled Meds:  fentaNYL       midazolam       amiodarone  150 mg Intravenous Once   atorvastatin  40 mg Oral QPM   Continuous Infusions:  sodium chloride 75 mL/hr at 02/13/23 1745   amiodarone     Followed by   Melene Muller ON 02/14/2023] amiodarone     lactated ringers Stopped (02/13/23 1336)   PRN Meds: fentaNYL, midazolam, acetaminophen  **OR** acetaminophen, albuterol, bisacodyl, fentaNYL (SUBLIMAZE) injection, guaiFENesin, HYDROcodone-acetaminophen, ondansetron **OR** ondansetron (ZOFRAN) IV, prochlorperazine  Allergies:   No Known Allergies  Social History:   Social History   Socioeconomic History   Marital status: Married    Spouse name: Not on file   Number of children: Not on file   Years of education: Not on file   Highest education level: Not on file  Occupational History   Not on file  Tobacco Use   Smoking status: Former    Types: Cigarettes   Smokeless tobacco: Former    Types: Chew, Snuff   Tobacco comments:    03/06/2017 "quit smoking when I was young; quit chew/snuff in ~ 2008"  Vaping Use   Vaping status: Never Used  Substance and Sexual Activity   Alcohol use: Yes    Alcohol/week: 2.0 standard drinks of alcohol    Types: 2 Cans of beer per week    Comment: 2-3 drinks weekly   Drug use: No   Sexual activity: Yes  Other Topics Concern  Not on file  Social History Narrative   Married since 2001,third.Lives with wife.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   Social Determinants of Health   Financial Resource Strain: Low Risk  (05/16/2020)   Overall Financial Resource Strain (CARDIA)    Difficulty of Paying Living Expenses: Not hard at all  Food Insecurity: No Food Insecurity (02/12/2023)   Hunger Vital Sign    Worried About Running Out of Food in the Last Year: Never true    Ran Out of Food in the Last Year: Never true  Transportation Needs: No Transportation Needs (02/12/2023)   PRAPARE - Administrator, Civil Service (Medical): No    Lack of Transportation (Non-Medical): No  Physical Activity: Sufficiently Active (05/16/2020)   Exercise Vital Sign    Days of Exercise per Week: 5 days    Minutes of Exercise per Session: 30 min  Stress: No Stress Concern Present (05/16/2020)   [employer] of Occupational Health - Occupational Stress Questionnaire    Feeling of Stress : Not at all   Social Connections: Moderately Integrated (05/16/2020)   Social Connection and Isolation Panel [NHANES]    Frequency of Communication with Friends and Family: More than three times a week    Frequency of Social Gatherings with Friends and Family: More than three times a week    Attends Religious Services: More than 4 times per year    Active Member of [employer] or Organizations: No    Attends Banker Meetings: Never    Marital Status: Married  Catering manager Violence: Not At Risk (02/12/2023)   Humiliation, Afraid, Rape, and Kick questionnaire    Fear of Current or Ex-Partner: No    Emotionally Abused: No    Physically Abused: No    Sexually Abused: No    Family History:    Family History  Problem Relation Age of Onset   Diabetes Mother    Cancer Father    Cancer Brother      ROS:  Please see the history of present illness.   All other ROS reviewed and negative.     Physical Exam/Data:   Vitals:   02/13/23 2030 02/13/23 2100 02/13/23 2109 02/13/23 2130  BP: (!) 81/57 (!) 78/69 93/75 (!) 76/56  Pulse:   (!) 142   Resp:   19   Temp:      TempSrc:      SpO2:      Weight:      Height:        Intake/Output Summary (Last 24 hours) at 02/13/2023 2257 Last data filed at 02/13/2023 1100 Gross per 24 hour  Intake --  Output 335 ml  Net -335 ml      02/13/2023   12:55 PM 02/13/2023    4:15 AM 02/12/2023    3:34 PM  Last 3 Weights  Weight (lbs) 268 lb 269 lb 12.8 oz 268 lb 1.3 oz  Weight (kg) 121.564 kg 122.38 kg 121.6 kg     Body mass index is 32.62 kg/m.  General:  Well nourished, well developed, in no acute distress HEENT: normal Neck: no JVD Vascular: No carotid bruits; Distal pulses 2+ bilaterally Cardiac:  normal S1, S2; irreg irreg; no murmur  Lungs:  clear to auscultation bilaterally, no wheezing, rhonchi or rales  Abd: soft, nontender, no hepatomegaly  Ext: no edema Musculoskeletal:  No deformities Skin: warm and dry  Neuro:  CNs 2-12 intact, no  focal abnormalities noted Psych:  Normal affect   EKG:  The EKG was personally reviewed and demonstrates:  AF w/ RVR, HR 146 Telemetry:  Telemetry was personally reviewed and demonstrates:  afib w/ RVR, HR 150s  Relevant CV Studies: 07-24-21 TTE 1. Left ventricular ejection fraction, by estimation, is 60 to 65%. The  left ventricle has normal function. The left ventricle has no regional  wall motion abnormalities. There is moderate left ventricular hypertrophy.  Left ventricular diastolic  parameters are consistent with Grade I diastolic dysfunction (impaired  relaxation). The average left ventricular global longitudinal strain is  -20.0 %. The global longitudinal strain is normal.   2. Right ventricular systolic function is normal. The right ventricular  size is normal.   3. Left atrial size was mildly dilated.   4. The mitral valve is normal in structure. No evidence of mitral valve  regurgitation. No evidence of mitral stenosis.   5. The aortic valve is tricuspid. There is mild calcification of the  aortic valve. There is mild thickening of the aortic valve. Aortic valve  regurgitation is mild to moderate. No aortic stenosis is present.   6. Aortic dilatation noted. There is mild dilatation of the aortic root,  measuring 41 mm. There is mild dilatation of the ascending aorta,  measuring 40 mm.   7. The inferior vena cava is normal in size with greater than 50%  respiratory variability, suggesting right atrial pressure of 3 mmHg.    LHC 03-05-17 Prox LAD lesion, 50 %stenosed bifurcation lesion with Ost 1st Diag lesion, 55 %stenosed. The left ventricular systolic function is normal. The left ventricular ejection fraction is 50-55% by visual estimate. LV end diastolic pressure is mildly elevated.   Laboratory Data:  High Sensitivity Troponin:  No results for input(s): "TROPONINIHS" in the last 720 hours.   Chemistry Recent Labs  Lab 02/12/23 0919 02/12/23 1054 02/13/23 0402   NA 133* 131* 134*  K 3.6 3.5 3.0*  CL 96* 96* 98  CO2 22 22 23   GLUCOSE 118* 114* 91  BUN 24* 25* 28*  CREATININE 1.30* 1.48* 1.15  CALCIUM 9.4 8.9 8.8*  MG 1.9  --  2.0  GFRNONAA >60 52* >60  ANIONGAP 15 13 13     Recent Labs  Lab 02/12/23 0919 02/12/23 1054 02/13/23 0402  PROT 6.6 5.8* 5.3*  ALBUMIN 3.9 3.4* 3.1*  AST 49* 45* 41  ALT 49* 45* 44  ALKPHOS 107 96 96  BILITOT 4.6* 3.7* 2.6*   Lipids No results for input(s): "CHOL", "TRIG", "HDL", "LABVLDL", "LDLCALC", "CHOLHDL" in the last 168 hours.  Hematology Recent Labs  Lab 02/12/23 0919 02/12/23 1054 02/13/23 0402  WBC 7.6 7.5 7.0  RBC 4.30 3.91* 3.62*  HGB 14.0 12.8* 11.8*  HCT 41.9 37.9* 35.4*  MCV 97.4 96.9 97.8  MCH 32.6 32.7 32.6  MCHC 33.4 33.8 33.3  RDW 14.1 14.3 14.0  PLT 157 149* 120*   Thyroid No results for input(s): "TSH", "FREET4" in the last 168 hours.  BNPNo results for input(s): "BNP", "PROBNP" in the last 168 hours.  DDimer No results for input(s): "DDIMER" in the last 168 hours.   Radiology/Studies:  CT ABDOMEN LIMITED WO CONTRAST  Result Date: 02/13/2023 CLINICAL DATA:  Retroperitoneal biopsy, procedure deferred due to onset of AFib * Tracking Code: BO * EXAM: CT ABDOMEN WITHOUT CONTRAST LIMITED TECHNIQUE: Multidetector CT imaging of the abdomen was performed following the standard protocol without IV contrast. RADIATION DOSE REDUCTION: This exam was performed according to the departmental dose-optimization program which includes automated exposure control, adjustment  of the mA and/or kV according to patient size and/or use of iterative reconstruction technique. COMPARISON:  CT abdomen pelvis, 02/12/2023 FINDINGS: Hepatobiliary: No solid liver abnormality is seen. No gallstones, gallbladder wall thickening, or biliary dilatation. Pancreas: Unremarkable. No pancreatic ductal dilatation or surrounding inflammatory changes. Spleen: Gross splenomegaly, maximum span 20.3 cm. Adrenals/Urinary Tract:  Adrenal glands are unremarkable. Simple, benign left renal cortical cysts, for which no further follow-up or characterization is required kidneys are otherwise normal, without renal calculi, solid lesion, or hydronephrosis. Stomach/Bowel: Stomach is within normal limits. No evidence of bowel wall thickening, distention, or inflammatory changes. Descending colonic diverticulosis. Vascular/Lymphatic: Aortic atherosclerosis. Unchanged bulky retrocrural, retroperitoneal, and bilateral iliac lymphadenopathy, with numerous additional prominent lymph nodes throughout the bowel mesentery. Other: No abdominal wall hernia or abnormality. No ascites. Musculoskeletal: No acute or significant osseous findings. IMPRESSION: 1. Unchanged bulky retrocrural, retroperitoneal, and bilateral iliac lymphadenopathy, with numerous additional prominent lymph nodes throughout the bowel mesentery. 2. Gross splenomegaly, maximum span 20.3 cm. 3. Findings are unchanged in comparison to prior day's examination and most consistent with lymphoma. No acute findings. 4. Descending colonic diverticulosis without evidence of acute diverticulitis. Aortic Atherosclerosis (ICD10-I70.0). Electronically Signed   By: Jearld Lesch M.D.   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 Right lower quadrant abdominal pain. 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 * Tracking Code: BO * EXAM: CT ABDOMEN AND PELVIS WITH CONTRAST TECHNIQUE: Multidetector CT imaging of the abdomen and pelvis was performed using the standard protocol following bolus administration of intravenous contrast. RADIATION DOSE REDUCTION: This exam was performed according to the departmental dose-optimization program which includes automated exposure control, adjustment of the mA and/or kV according to patient size and/or use of iterative reconstruction technique.  CONTRAST:  OMNIPAQUE IOHEXOL 300 MG/ML  SOLN COMPARISON:  PET-CT 02/06/2023. CT of the chest, abdomen and pelvis 01/24/2023 and 04/11/2022. FINDINGS: Lower chest: Stable linear atelectasis or scarring at both lung bases. Small pulmonary nodules bilaterally are unchanged from recent prior imaging, measuring up to 8 mm in the right lower lobe and 9 mm in the left lower lobe, both on image 9/5. There is progressive subcarinal and distal paraesophageal adenopathy as seen on recent PET-CT. A distal right paraesophageal node measures up to 5.4 x 4.6 cm on image 14/2. Hepatobiliary: The liver is normal in density without suspicious focal abnormality. No evidence of gallstones, gallbladder wall thickening or biliary dilatation. Pancreas: Unremarkable. No pancreatic ductal dilatation or surrounding inflammatory changes. Spleen: Progressive splenomegaly compared with recent CT. No focal abnormality identified. Adrenals/Urinary Tract: Both adrenal glands appear normal. No evidence of urinary tract calculus, suspicious renal lesion or hydronephrosis. Unchanged cyst in the mid left kidney for which no follow-up imaging is recommended. Stable bladder compression by the bilateral pelvic lymphadenopathy. No intrinsic bladder abnormality identified. Stomach/Bowel: No enteric contrast administered. The stomach appears unremarkable for its degree of distension. No evidence of bowel wall thickening, distention or surrounding inflammatory change. Vascular/Lymphatic: Again demonstrated is extensive retroperitoneal, pelvic and bilateral inguinal adenopathy. No significant change is seen from the recent PET-CT, although some of the lymph nodes have enlarged from the diagnostic CT done 01/24/2023. For example, there is a left external iliac node measuring 4.5 cm short axis on image 86/2 which previously measured 3.7 cm. A presacral node measuring 3.1 cm on image 72/2 previously measured 2.3 cm. No nodal necrosis identified. Mild  aortic and branch  vessel atherosclerosis without evidence of aneurysm or large vessel occlusion. Reproductive: The prostate gland appears mildly enlarged, but stable. Other: No evidence of abdominal wall mass or hernia. No ascites or pneumoperitoneum. Musculoskeletal: No acute or significant osseous findings. There is multilevel spondylosis with a grade 1 anterolisthesis at L5-S1 secondary to underlying pars defects. Associated prominent foraminal narrowing in the lower lumbar spine, unchanged. Unless specific follow-up recommendations are mentioned in the findings or impression sections, no imaging follow-up of any mentioned incidental findings is recommended. IMPRESSION: 1. Known extensive adenopathy in the lower chest, abdomen and pelvis has mildly progressed compared with the CTs of less than 3 weeks earlier consistent with progressive lymphoma. 2. Progressive splenomegaly. 3. Stable small pulmonary nodules at both lung bases. 4. No definite acute findings. 5. 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 Associated prominent foraminal narrowing in the lower lumbar spine. 6.  Aortic Atherosclerosis (ICD10-I70.0). Electronically Signed   By: Carey Bullocks M.D.   On: 02/12/2023 12:16   DG Chest Port 1 View  Result Date: 02/12/2023 CLINICAL DATA:  Sepsis.  History of lymphoma EXAM: PORTABLE CHEST 1 VIEW COMPARISON:  PET-CT 02/06/2023 FINDINGS: Subtle bandlike opacity right lung base. Atelectasis is favored. No consolidation, pneumothorax or effusion. No edema. Normal cardiopericardial silhouette. Overlapping cardiac leads. IMPRESSION: Right basilar atelectasis.  No pneumothorax or effusion. Electronically Signed   By: Karen Kays M.D.   On: 02/12/2023 11:29     Assessment and Plan:   AF w/ RVR: pt is HD unstable w/ BP in 70s/50s, HR 150s. S/p amiodarone x 150mg  bolus w/ persistent AF RVR. DCCV performed at bedside. Lovenox 1mg /kg given pre-DCCV.  Will hold off on scheduled anti-coagulation at this time as a LN biopsy is planned. Pt will likely need long-term outpatient anti-coagulation after discharge. Once BP will tolerate, recommend restarting BB.  2. CAD: nonobstructive. No acute anginal sx at this time.   Risk Assessment/Risk Scores:          CHA2DS2-VASc Score = 2       For questions or updates, please contact [facility] Please consult [email or web address] for contact info under    Signed, Precious Reel, MD, [facility]  02/13/2023 10:57 PM

## 2023-02-13 NOTE — ED Notes (Signed)
RN unable to titrate Cardizem due to BP 106 systolic

## 2023-02-13 NOTE — Progress Notes (Signed)
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  He complained of profuse sweating started 2 to 3 days ago associated with lightheadedness.  He also is having night sweats in the last 2 to 3 days.  He has been out of his ibrutinib for the last 4 days due to being out of the medication.  He is also having a lot of body pains and aches.  He was recently sent for a PET scan by Dr. Ellin Saba with results pending.  He was sent to the ED [facility] the [facility] today when they found him to be hypotensive and severely weakened and deconditioned.  Dr. Ellin Saba has been concerned about Richter's transformation and has made arrangements for emergency CT biopsy to be done at [facility] on 02/13/2023.  Patient is being admitted for management of AKI, dehydration and weakness.   Assessment and Plan:  Acute Kidney Injury - this is prerenal given his significant clinical dehydration - treated with IV fluid hydration  - rechecked creatinine improved to 1.15    Hypotension  - secondary to severe dehydration - BP improving with IV fluids  - continue IV fluids - initially held some home BP meds and added holding parameters for reduced dose metoprolol due to hypotension - follow    CLL with small lymphocytic lymphoma  - [facility] lymph node biopsy [facility]/13/23 at 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 Ellin Saba based on biopsy results   Atrial Fibrillation with RVR - Pt was preparing to have IR biopsy done at [facility] and went into Afib RVR with HR in 150s and symptomatic  - he was sent to ED at [facility] and started on IV diltiazem infusion  - pt will remain at [facility] and signed out to [facility] Dr. Dartha Lodge on 02/13/23   Dehydration - responded well to IV fluid hydration   Hypokalemia - Pt is currently NPO for procedure - IV KCL runs x 3 ordered  - recheck in AM  - Mg is 2.0    Chronic pain  - pain and nausea medication ordered as needed   Lactic Acidosis - resolved after IV fluids given    CAD - resumed home  metoprolol but reduced dose to 12.5 mg BID with holding parameters - holding daily aspirin 81 mg until after biopsy has been completed    Dyslipidemia - resuming home daily atorvastatin    Hyponatremia - likely from dehydration and SIADH of malignancy - following CMP with IV fluid hydration   Elevated liver Enzymes/mild hyperbilirubinemia - possibly from ibrutinib - rechecked CMP after IV fluid hydration - LFTs normalized    DVT prophylaxis: SCDs until after biopsy completed   Code Status: Full   Family Communication: wife at bedside   Disposition Plan: home   Consults called: oncology, Interventional radiology     Consultants:  IR  and oncology  Procedures:  Planned for IR lymph node biopsy 8/1  Antimicrobials:    Subjective: Pt eager to transfer to [facility] to have biopsy done today  Objective: Vitals:   02/12/23 1831 02/12/23 2100 02/13/23 0415 02/13/23 1255  BP: 132/65 128/67 (!) 130/59   Pulse: 91 95 77   Resp: 20 16 18    Temp: 98.2 F (36.8 C) 98.5 F (36.9 C) 98.3 F (36.8 C)   TempSrc: Oral Oral Oral   SpO2: 100% 95% 96%   Weight:   122.4 kg 121.6 kg  Height:    6\' 4"  (1.93 m)    Intake/Output Summary (Last 24 hours) at 02/13/2023 1326 Last data filed at 02/13/2023 1100 Gross  per 24 hour  Intake 240 ml  Output 535 ml  Net -295 ml   Filed Weights   02/12/23 1534 02/13/23 0415 02/13/23 1255  Weight: 121.6 kg 122.4 kg 121.6 kg   Examination:  General exam: Appears calm and comfortable  Respiratory system: Clear to auscultation. Respiratory effort normal. Cardiovascular system: normal S1 & S2 heard. No JVD, murmurs, rubs, gallops or clicks. No pedal edema. Gastrointestinal system: Abdomen is nondistended, soft and nontender. No organomegaly or masses felt. Normal bowel sounds heard. Central nervous system: Alert and oriented. No focal neurological deficits. Extremities: Symmetric 5 x 5 power. Skin: No rashes, lesions or ulcers. Psychiatry: Judgement and insight appear normal. Mood & affect appropriate.   Data Reviewed: I have personally reviewed following labs and imaging studies  CBC: Recent Labs  Lab 02/12/23 0919 02/12/23 1054 02/13/23 0402  WBC 7.6 7.5 7.0  NEUTROABS 5.1 5.7  --   HGB 14.0 12.8* 11.8*  HCT 41.9 37.9* 35.4*  MCV 97.4 96.9 97.8  PLT 157 149* 120*    Basic Metabolic Panel: Recent Labs  Lab 02/12/23 0919 02/12/23 1054 02/13/23 0402  NA 133* 131* 134*  K 3.6 3.5 3.0*  CL 96* 96* 98  CO2 22 22 23   GLUCOSE 118* 114* 91  BUN 24* 25* 28*  CREATININE 1.30* 1.48* 1.15  CALCIUM 9.4 8.9 8.8*  MG 1.9  --  2.0    CBG: No results for input(s): "GLUCAP" in the last 168 hours.  Recent Results (from the past 240 hour(s))  Blood Culture (routine x 2)     Status: None (Preliminary result)   Collection Time: 02/12/23 10:54 AM   Specimen: BLOOD  Result Value Ref Range Status   Specimen Description BLOOD BLOOD RIGHT HAND  Final   Special Requests   Final    BOTTLES DRAWN AEROBIC AND ANAEROBIC Blood Culture results may not be optimal due to an excessive volume of blood received in culture bottles   Culture   Final    NO GROWTH < 24 HOURS Performed at [facility], [address] [place], [place] [address]    Report Status  PENDING  Incomplete  Blood Culture (routine x 2)     Status: None (Preliminary result)   Collection Time: 02/12/23 10:54 AM   Specimen: BLOOD  Result Value Ref Range Status   Specimen Description BLOOD BLOOD LEFT ARM  Final   Special Requests   Final    BOTTLES DRAWN AEROBIC AND ANAEROBIC Blood Culture adequate volume   Culture   Final    NO GROWTH < 24 HOURS Performed at [facility], [address] [place], [place] [address]    Report Status PENDING  Incomplete     Radiology Studies: CT ABDOMEN LIMITED WO CONTRAST  Result  Date: 02/13/2023 CLINICAL DATA:  Retroperitoneal biopsy, procedure deferred due to onset of AFib * Tracking Code: BO * EXAM: CT ABDOMEN WITHOUT CONTRAST LIMITED TECHNIQUE: Multidetector CT imaging of the abdomen was performed following the standard protocol without IV contrast. RADIATION DOSE REDUCTION: This exam was performed according to the departmental dose-optimization program which includes automated exposure control, adjustment of the mA and/or kV according to patient size and/or use of iterative reconstruction technique. COMPARISON:  CT abdomen pelvis, 02/12/2023 FINDINGS: Hepatobiliary: No solid liver abnormality is seen. No gallstones, gallbladder wall thickening, or biliary dilatation. Pancreas: Unremarkable. No pancreatic ductal dilatation or surrounding inflammatory changes. Spleen: Gross splenomegaly, maximum span 20.3 cm. Adrenals/Urinary Tract: Adrenal glands are unremarkable. Simple, benign left renal cortical cysts, for which no further follow-up or characterization is required kidneys are otherwise normal, without renal calculi, solid lesion, or hydronephrosis. Stomach/Bowel: Stomach is within normal limits. No evidence of bowel wall thickening, distention, or inflammatory changes. Descending colonic diverticulosis. Vascular/Lymphatic: Aortic atherosclerosis. Unchanged bulky retrocrural, retroperitoneal, and bilateral iliac lymphadenopathy, with numerous  additional prominent lymph nodes throughout the bowel mesentery. Other: No abdominal wall hernia or abnormality. No ascites. Musculoskeletal: No acute or significant osseous findings. IMPRESSION: 1. Unchanged bulky retrocrural, retroperitoneal, and bilateral iliac lymphadenopathy, with numerous additional prominent lymph nodes throughout the bowel mesentery. 2. Gross splenomegaly, maximum span 20.3 cm. 3. Findings are unchanged in comparison to prior day's examination and most consistent with lymphoma. No acute findings. 4. Descending colonic diverticulosis without evidence of acute diverticulitis. Aortic Atherosclerosis (ICD10-I70.0). Electronically Signed   By: Jearld Lesch M.D.   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 Right lower quadrant abdominal pain. 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 * Tracking Code: BO * EXAM: CT ABDOMEN AND PELVIS WITH CONTRAST TECHNIQUE: Multidetector CT imaging of the abdomen and pelvis was performed using the standard protocol following bolus administration of intravenous contrast. RADIATION DOSE REDUCTION: This exam was performed according to the departmental dose-optimization program which includes automated exposure control, adjustment of the mA and/or kV according to patient size and/or use of iterative reconstruction technique. CONTRAST:  OMNIPAQUE IOHEXOL 300 MG/ML  SOLN COMPARISON:  PET-CT 02/06/2023. CT of the chest, abdomen and pelvis 01/24/2023 and 04/11/2022. FINDINGS: Lower chest: Stable linear atelectasis or scarring at both lung bases. Small pulmonary nodules bilaterally are unchanged from recent prior imaging, measuring up to 8 mm in the right lower lobe and 9 mm in the left lower lobe, both on image 9/5. There is progressive subcarinal and distal paraesophageal adenopathy as seen on recent PET-CT. A distal right paraesophageal node  measures up to 5.4 x 4.6 cm on image 14/2. Hepatobiliary: The liver is normal in density without suspicious focal abnormality. No evidence of gallstones, gallbladder wall thickening or biliary dilatation. Pancreas: Unremarkable. No pancreatic ductal dilatation or surrounding inflammatory changes. Spleen: Progressive splenomegaly compared with recent CT. No focal abnormality identified. Adrenals/Urinary Tract: Both adrenal glands appear normal. No evidence of urinary tract calculus, suspicious renal lesion or hydronephrosis. Unchanged cyst in the mid left kidney for which no follow-up imaging is recommended. Stable bladder compression by the bilateral pelvic lymphadenopathy. No intrinsic bladder abnormality identified. Stomach/Bowel: No enteric contrast administered. The stomach appears unremarkable for its degree of distension. No evidence of bowel wall thickening, distention or surrounding inflammatory change. Vascular/Lymphatic: Again demonstrated is extensive retroperitoneal, pelvic and bilateral inguinal adenopathy. No significant change is seen from the  recent PET-CT, although some of the lymph nodes have enlarged from the diagnostic CT done 01/24/2023. For example, there is a left external iliac node measuring 4.5 cm short axis on image 86/2 which previously measured 3.7 cm. A presacral node measuring 3.1 cm on image 72/2 previously measured 2.3 cm. No nodal necrosis identified. Mild aortic and branch vessel atherosclerosis without evidence of aneurysm or large vessel occlusion. Reproductive: The prostate gland appears mildly enlarged, but stable. Other: No evidence of abdominal wall mass or hernia. No ascites or pneumoperitoneum. Musculoskeletal: No acute or significant osseous findings. There is multilevel spondylosis with a grade 1 anterolisthesis at L5-S1 secondary to underlying pars defects. Associated prominent foraminal narrowing in the lower lumbar spine, unchanged. Unless specific follow-up  recommendations are mentioned in the findings or impression sections, no imaging follow-up of any mentioned incidental findings is recommended. IMPRESSION: 1. Known extensive adenopathy in the lower chest, abdomen and pelvis has mildly progressed compared with the CTs of less than 3 weeks earlier consistent with progressive lymphoma. 2. Progressive splenomegaly. 3. Stable small pulmonary nodules at both lung bases. 4. No definite acute findings. 5. 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 Associated prominent foraminal narrowing in the lower lumbar spine. 6.  Aortic Atherosclerosis (ICD10-I70.0). Electronically Signed   By: Carey Bullocks M.D.   On: 02/12/2023 12:16   DG Chest Port 1 View  Result Date: 02/12/2023 CLINICAL DATA:  Sepsis.  History of lymphoma EXAM: PORTABLE CHEST 1 VIEW COMPARISON:  PET-CT 02/06/2023 FINDINGS: Subtle bandlike opacity right lung base. Atelectasis is favored. No consolidation, pneumothorax or effusion. No edema. Normal cardiopericardial silhouette. Overlapping cardiac leads. IMPRESSION: Right basilar atelectasis.  No pneumothorax or effusion. Electronically Signed   By: Karen Kays M.D.   On: 02/12/2023 11:29    Scheduled Meds:  atorvastatin  40 mg Oral QPM   diltiazem  20 mg Intravenous Once   metoprolol tartrate  12.5 mg Oral BID   Continuous Infusions:  diltiazem (CARDIZEM) infusion     lactated ringers 125 mL/hr at 02/13/23 0050     LOS: 0 days   Time spent: 45 mins  Felix Pratt Laural Benes, MD How to contact the [facility] Attending or Consulting provider 7A - 7P or covering provider during after hours 7P -7A, for this patient?  Check the care team in [facility] and look for a) attending/consulting [facility] provider listed and b) the [facility] team listed Log into [email or web address] and use [place]'s universal password to access. If you do not have the password, please contact the hospital operator. Locate the [facility] provider you  are looking for under [facility] and page to a number that you can be directly reached. If you still have difficulty reaching the provider, please page the [facility] (Director on Call) for the Hospitalists listed on amion for assistance.  02/13/2023, 1:26 PM

## 2023-02-13 NOTE — CV Procedure (Signed)
DCCV  Performed semi-urgently due to hemodynamic instability, with hypotension (BP 70s/50s) and AF w/ RVR with HR 140s-150s (onset of AF <24h previous). Pt received bolus of amiodarone 150mg  IV x 1 and lovenox 1mg /kg. Risks and benefits of procedure explained to the patient and consent obtained.  Pads placed in the AP position. Pt sedated with IV fentanyl and 3mg  IV versed. Defibrillator charged to 200J and shock x 1 administered with successful conversion to NSR. 12-lead EKG obtained post-procedure to document NSR.  Precious Reel, MD , [facility] 02/13/23 11:36 PM

## 2023-02-13 NOTE — H&P (Signed)
wag     Chief Complaint: Patient was seen in consultation today for LN bx No chief complaint on file.  at the request of Standley Dakins   Referring Physician(s): Standley Dakins   Supervising Physician: Gilmer Mor  Patient Status: [facility] inpatient   History of Present Illness: Adam Reyes is a 67 y.o. male with PMHs of HTN, CAD, STEMI in 2018, CLL who presents for LN bx.   Patient was diagnosed with stage IV small lymphocytic lymphoma and has been followed by hematology and oncology. CT CAP on 01/24/23 showed increased adenopathy above and below the diaphragm with new splenomegaly compatible with worsening lymphomatous disease, IR was requested for outpatient LN bx, case was reviewed and approved by Dr. Deanne Coffer for L RP LN bx.   Patient went to [facility] on 7/31 and underwent PET  which showed:  1.. Extensive tracer avid adenopathy within the chest, abdomen, and pelvis compatible with lymphoma. Deauville criteria 4/5. 2. Splenomegaly with diffuse increased uptake concerning for splenic involvement. Deauville criteria 4 3. Bilateral lung nodules are not significantly tracer avid. However these lung nodules are too small to reliably characterize by PET-CT. 4. Aortic Atherosclerosis (ICD10-I70.0).   Patient became diaphoretic and hypotensive in the [facility], he was sent to ED for further eval and management, admitted for management of AKI, dehydration an weakness. 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 Chart reviewed and spoke with [facility] ED RN, patient has been taking aspirin 81 mg which typically requires 5 day for for RP LN bx.   Asked Dr. Bryn Gulling to review PET from 7/31 to look for more superficial target that does not require AC/AP hold, Dr. Bryn Gulling recommended proceeding with L RP LN as it is most hypermetabolic on PET. Case discussed with Dr. Loreta Ave, OK to proceed with CT guided L RP LN  bx w/o holding ASA 81 mg.  Patient was brought to [facility] IR from [facility] for the biopsy today.   Patient laying in bed, not in acute distress.  Denise headache, fever, chills, shortness of breath, cough, chest pain, abdominal pain, nausea ,vomiting, and bleeding.  States that he thinks he was diagnosed with CLL in 2017, has been talking oral chemotherapy.   Past Medical History:  Diagnosis Date   Chronic lymphocytic leukemia (CLL), B-cell (HCC)    Small Cell Lymphoma   Coronary artery disease, non-occlusive 02/2017   Cardiac cath in setting of MI: 50 and 55% bifurcation LAD-Diag1   Enlarged lymph node    left neck   Hypertension    STEMI (ST elevation myocardial infarction) (HCC) 03/05/2017   hx/notes 03/05/2017 -likely aborted anterior STEMI with 50% bifurcation LAD-Diag1.  No PCI.  Preserved EF   Syncope due to orthostatic hypotension 04/21/2018    Past Surgical History:  Procedure Laterality Date   BIOPSY  10/10/2020   Procedure: BIOPSY;  Surgeon: Dolores Frame, MD;  [facility];  Service: Gastroenterology;;   BIOPSY  11/14/2020   Procedure: BIOPSY;  Surgeon: Dolores Frame, MD;  [facility];  Service: Gastroenterology;;   COLONOSCOPY N/A 10/11/2015   Procedure: COLONOSCOPY;  Surgeon: Malissa Hippo, MD;  [facility];  Service: Endoscopy;  Laterality: N/A;  10/11/2015   COLONOSCOPY WITH PROPOFOL N/A 10/10/2020   Procedure: COLONOSCOPY WITH PROPOFOL;  Surgeon: Dolores Frame, MD;  [facility];  Service: Gastroenterology;  Laterality: N/A;  AM   ENTEROSCOPY N/A 11/14/2020   Procedure: push enteroscopy;  Surgeon: Dolores Frame, MD;  [facility];  Service: Gastroenterology;  Laterality: N/A;   ESOPHAGOGASTRODUODENOSCOPY (EGD) WITH PROPOFOL N/A 10/10/2020   Procedure: ESOPHAGOGASTRODUODENOSCOPY (EGD) WITH PROPOFOL;  Surgeon: Dolores Frame, MD;  [facility];  Service:  Gastroenterology;  Laterality: N/A;   ESOPHAGOGASTRODUODENOSCOPY (EGD) WITH PROPOFOL N/A 11/14/2020   Procedure: ESOPHAGOGASTRODUODENOSCOPY (EGD) WITH PROPOFOL;  Surgeon: Dolores Frame, MD;  [facility];  Service: Gastroenterology;  Laterality: N/A;  12:00   GIVENS CAPSULE STUDY N/A 11/07/2020   Procedure: GIVENS CAPSULE STUDY;  Surgeon: Dolores Frame, MD;  [facility];  Service: Gastroenterology;  Laterality: N/A;  7:30 am   Graded Exercise Tolerance Test (GXT/ETT)  05/2017   10.7 METs (9: 25 min.  Reached 103% max predicted heart rate).  No EKG findings to suggest coronary ischemia.  Negative, low risk GXT   HEMOSTASIS CLIP PLACEMENT  11/14/2020   Procedure: HEMOSTASIS CLIP PLACEMENT;  Surgeon: Dolores Frame, MD;  [facility];  Service: Gastroenterology;;  small bowel nodule   LEFT HEART CATH AND CORONARY ANGIOGRAPHY N/A 03/05/2017   Procedure: LEFT HEART CATH AND CORONARY ANGIOGRAPHY;  Surgeon: Marykay Lex, MD;  Location: [facility] INVASIVE CV LAB;  Service: Cardiovascular: pLAD-Diag1 50-55% (non-flow-limiiting).  EF ~50-55%.  although bifurcation lesion was presumed Culprit - no PCI (not flow limiting). - Med Rx.   MASS BIOPSY Left 06/27/2015   Procedure: OPEN LEFT NECK BIOPSY ;  Surgeon: Newman Pies, MD;  Location: [facility];  Service: ENT;  Laterality: Left;   POLYPECTOMY  10/10/2020   Procedure: POLYPECTOMY;  Surgeon: Dolores Frame, MD;  [facility];  Service: Gastroenterology;;   REFRACTIVE SURGERY Bilateral     Allergies: Patient has no known allergies.  Medications: Prior to Admission medications   Medication Sig Start Date End Date Taking? Authorizing Provider  aspirin 81 MG chewable tablet Chew 1 tablet (81 mg total) by mouth every other day. 06/20/17  Yes Marykay Lex, MD  atorvastatin (LIPITOR) 40 MG tablet KEEP OV. Patient taking differently: Take 40 mg by mouth daily. KEEP  OV. 11/13/22  Yes Jodelle Gross, NP  cholecalciferol (VITAMIN D3) 25 MCG (1000 UNIT) tablet Take 10,000 Units by mouth daily. 10/09/20  Yes [provider]  metoprolol tartrate (LOPRESSOR) 25 MG tablet Take 1 tablet (25 mg total) by mouth in the morning AND 0.5 tablets (12.5 mg total) every evening. 09/24/22  Yes Anabel Halon, MD  telmisartan (MICARDIS) 20 MG tablet Take 1 tablet (20 mg total) by mouth daily. 01/28/23  Yes Anabel Halon, MD  traMADol (ULTRAM) 50 MG tablet Take 1 tablet (50 mg total) by mouth every 6 (six) hours as needed. 02/10/23  Yes Doreatha Massed, MD  ferrous sulfate 324 (65 Fe) MG TBEC Take 324 mg by mouth every other day.    [provider]     Family History  Problem Relation Age of Onset   Diabetes Mother    Cancer Father    Cancer Brother     Social History   Socioeconomic History   Marital status: Married    Spouse name: Not on file   Number of children: Not on file   Years of education: Not on file   Highest education level: Not on file  Occupational History   Not on file  Tobacco Use   Smoking status:  Former    Types: Cigarettes   Smokeless tobacco: Former    Types: Chew, Snuff   Tobacco comments:    03/06/2017 "quit smoking when I was young; quit chew/snuff in ~ 2008"  Vaping Use   Vaping status: Never Used  Substance and Sexual Activity   Alcohol use: Yes    Alcohol/week: 2.0 standard drinks of alcohol    Types: 2 Cans of beer per week    Comment: 2-3 drinks weekly   Drug use: No   Sexual activity: Yes  Other Topics Concern   Not on file  Social History Narrative   Married since 2001,third.Lives with wife.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   Social Determinants of Health   Financial Resource Strain: Low Risk  (05/16/2020)   Overall Financial Resource Strain (CARDIA)    Difficulty of Paying Living Expenses: Not hard at all  Food Insecurity: No Food Insecurity (02/12/2023)   Hunger Vital Sign    Worried About Running Out  of Food in the Last Year: Never true    Ran Out of Food in the Last Year: Never true  Transportation Needs: No Transportation Needs (02/12/2023)   PRAPARE - Administrator, Civil Service (Medical): No    Lack of Transportation (Non-Medical): No  Physical Activity: Sufficiently Active (05/16/2020)   Exercise Vital Sign    Days of Exercise per Week: 5 days    Minutes of Exercise per Session: 30 min  Stress: No Stress Concern Present (05/16/2020)   [employer] of Occupational Health - Occupational Stress Questionnaire    Feeling of Stress : Not at all  Social Connections: Moderately Integrated (05/16/2020)   Social Connection and Isolation Panel [NHANES]    Frequency of Communication with Friends and Family: More than three times a week    Frequency of Social Gatherings with Friends and Family: More than three times a week    Attends Religious Services: More than 4 times per year    Active Member of [employer] or Organizations: No    Attends Banker Meetings: Never    Marital Status: Married     Review of Systems: A 12 point ROS discussed and pertinent positives are indicated in the HPI above.  All other systems are negative.  Vital Signs: There were no vitals taken for this visit.   Physical Exam Vitals reviewed.  Constitutional:      General: He is not in acute distress.    Appearance: He is not ill-appearing.  HENT:     Head: Normocephalic.     Mouth/Throat:     Mouth: Mucous membranes are moist.     Pharynx: Oropharynx is clear.  Eyes:     Extraocular Movements: Extraocular movements intact.  Cardiovascular:     Rate and Rhythm: Normal rate and regular rhythm.     Pulses: Normal pulses.     Comments: Very strong heart sound. Heartbeat is visible from outside.  Pulmonary:     Effort: Pulmonary effort is normal.     Breath sounds: Normal breath sounds.  Abdominal:     General: Abdomen is flat. Bowel sounds are normal.     Palpations: Abdomen is  soft.  Musculoskeletal:     Cervical back: Neck supple.  Skin:    General: Skin is warm and dry.     Coloration: Skin is not jaundiced or pale.  Neurological:     Mental Status: He is alert and oriented to person, place, and time.  Psychiatric:  Mood and Affect: Mood normal.        Behavior: Behavior normal.        Judgment: Judgment normal.     MD Evaluation Airway: WNL Heart: WNL Abdomen: WNL Chest/ Lungs: WNL ASA  Classification: 3 Mallampati/Airway Score: Two  Imaging: CT ABDOMEN PELVIS W CONTRAST  Result Date: 02/12/2023 CLINICAL DATA:  History of small lymphocytic lymphoma with progression on recent PET-CT. Right lower quadrant abdominal pain. 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 * Tracking Code: BO * EXAM: CT ABDOMEN AND PELVIS WITH CONTRAST TECHNIQUE: Multidetector CT imaging of the abdomen and pelvis was performed using the standard protocol following bolus administration of intravenous contrast. RADIATION DOSE REDUCTION: This exam was performed according to the departmental dose-optimization program which includes automated exposure control, adjustment of the mA and/or kV according to patient size and/or use of iterative reconstruction technique. CONTRAST:  OMNIPAQUE IOHEXOL 300 MG/ML  SOLN COMPARISON:  PET-CT 02/06/2023. CT of the chest, abdomen and pelvis 01/24/2023 and 04/11/2022. FINDINGS: Lower chest: Stable linear atelectasis or scarring at both lung bases. Small pulmonary nodules bilaterally are unchanged from recent prior imaging, measuring up to 8 mm in the right lower lobe and 9 mm in the left lower lobe, both on image 9/5. There is progressive subcarinal and distal paraesophageal adenopathy as seen on recent PET-CT. A distal right paraesophageal node measures up to 5.4 x 4.6 cm on image 14/2. Hepatobiliary: The liver is normal in density without suspicious focal abnormality. No evidence of gallstones, gallbladder wall thickening or biliary dilatation.  Pancreas: Unremarkable. No pancreatic ductal dilatation or surrounding inflammatory changes. Spleen: Progressive splenomegaly compared with recent CT. No focal abnormality identified. Adrenals/Urinary Tract: Both adrenal glands appear normal. No evidence of urinary tract calculus, suspicious renal lesion or hydronephrosis. Unchanged cyst in the mid left kidney for which no follow-up imaging is recommended. Stable bladder compression by the bilateral pelvic lymphadenopathy. No intrinsic bladder abnormality identified. Stomach/Bowel: No enteric contrast administered. The stomach appears unremarkable for its degree of distension. No evidence of bowel wall thickening, distention or surrounding inflammatory change. Vascular/Lymphatic: Again demonstrated is extensive retroperitoneal, pelvic and bilateral inguinal adenopathy. No significant change is seen from the recent PET-CT, although some of the lymph nodes have enlarged from the diagnostic CT done 01/24/2023. For example, there is a left external iliac node measuring 4.5 cm short axis on image 86/2 which previously measured 3.7 cm. A presacral node measuring 3.1 cm on image 72/2 previously measured 2.3 cm. No nodal necrosis identified. Mild aortic and branch vessel atherosclerosis without evidence of aneurysm or large vessel occlusion. Reproductive: The prostate gland appears mildly enlarged, but stable. Other: No evidence of abdominal wall mass or hernia. No ascites or pneumoperitoneum. Musculoskeletal: No acute or significant osseous findings. There is multilevel spondylosis with a grade 1 anterolisthesis at L5-S1 secondary to underlying pars defects. Associated prominent foraminal narrowing in the lower lumbar spine, unchanged. Unless specific follow-up recommendations are mentioned in the findings or impression sections, no imaging follow-up of any mentioned incidental findings is recommended. IMPRESSION: 1. Known extensive adenopathy in the lower chest, abdomen  and pelvis has mildly progressed compared with the CTs of less than 3 weeks earlier consistent with progressive lymphoma. 2. Progressive splenomegaly. 3. Stable small pulmonary nodules at both lung bases. 4. No definite acute findings. 5. 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 Associated prominent foraminal narrowing in the lower lumbar spine. 6.  Aortic Atherosclerosis (ICD10-I70.0). Electronically Signed   By:  Carey Bullocks M.D.   On: 02/12/2023 12:16   DG Chest Port 1 View  Result Date: 02/12/2023 CLINICAL DATA:  Sepsis.  History of lymphoma EXAM: PORTABLE CHEST 1 VIEW COMPARISON:  PET-CT 02/06/2023 FINDINGS: Subtle bandlike opacity right lung base. Atelectasis is favored. No consolidation, pneumothorax or effusion. No edema. Normal cardiopericardial silhouette. Overlapping cardiac leads. IMPRESSION: Right basilar atelectasis.  No pneumothorax or effusion. Electronically Signed   By: Karen Kays M.D.   On: 02/12/2023 11:29   NM PET Image Restag (PS) Skull Base To Thigh  Result Date: 02/12/2023 CLINICAL DATA:  Subsequent treatment strategy for small lymphocytic lymphoma. EXAM: NUCLEAR MEDICINE PET SKULL BASE TO THIGH TECHNIQUE: 13.3 mCi F-18 FDG was injected intravenously. Full-ring PET imaging was performed from the skull base to thigh after the radiotracer. CT data was obtained and used for attenuation correction and anatomic localization. Fasting blood glucose: 105 mg/dl COMPARISON:  CT 08/13/84 FINDINGS: Mediastinal blood pool activity: SUV max 2.51 Liver activity: SUV max 3.29 NECK: Bilateral cervical adenopathy is identified with corresponding mild increased uptake. -right level 2 node measures 1.6 cm with SUV max of 2.75. -Left level 3 lymph node measures 1.3 cm with SUV max of 2.69, image 59/202. Incidental CT findings: None. CHEST: Left superior mediastinal prevascular lymph node measures 1.7 cm with SUV max of 3.93, image 86/202.  Multiple tracer avid posterior mediastinal lymph nodes are identified. -aorto esophageal lymph node measures 1.8 cm with SUV max of 3.99. -Right posterior mediastinal node just above the GE junction measures 3.2 cm with SUV max of 4.71, image 148/202. -Left lower posterior mediastinal node measures 3.4 cm with SUV max 4.59, image 148/202. Bilateral retropectoral adenopathy is identified which exhibits mild tracer uptake. -Right retropectoral lymph node measures 1.5 cm with SUV max of 1.82, image 90/202. -Left retropectoral lymph node measures 1.4 cm with SUV max of 1.98, image 94/202. Small bilateral lung nodules are again noted. -Index nodule within the anterior left upper lobe measures 7 mm with SUV max of 0.89, image 88/202. -1 cm nodule within the basilar right upper lobe has an SUV max of 0.71, image 98/202. Incidental CT findings: Aortic atherosclerosis and coronary artery calcifications. ABDOMEN/PELVIS: There is extensive tracer avid abdominopelvic adenopathy. -left retrocrural lymph node measures 2.4 cm with SUV max of 5.19, image 165/202. -Left retroperitoneal nodal conglomeration measures 9.2 x 6.5 cm with SUV max of 10.76, image 200/202. -Right common iliac lymph node measures 1.9 cm with SUV max of 4.65, image 238/202 -Left inguinal lymph node measures 1.7 cm with SUV max of 3.07, image 286/202. -Large nodal conglomeration within the left external iliac chain measures 11.3 x 6.9 cm with an SUV max 8.89, image 260/202. Diffuse increased uptake within the spleen has an SUV max 3.67. The spleen is enlarged measuring 18.09 cm in cranial caudal dimension. No abnormal tracer uptake identified within the liver, pancreas, or adrenal glands. Incidental CT findings: Left kidney cyst measures 6.3 cm, image 190/202. Aortic atherosclerotic calcifications. SKELETON: No focal hypermetabolic activity to suggest skeletal metastasis. Incidental CT findings: None. IMPRESSION: 1. Extensive tracer avid adenopathy within the  chest, abdomen, and pelvis compatible with lymphoma. Deauville criteria 4/5. 2. Splenomegaly with diffuse increased uptake concerning for splenic involvement. Deauville criteria 4 3. Bilateral lung nodules are not significantly tracer avid. However these lung nodules are too small to reliably characterize by PET-CT. 4. Aortic Atherosclerosis (ICD10-I70.0). Electronically Signed   By: Signa Kell M.D.   On: 02/12/2023 09:46   US Venous Img  Lower Unilateral Left  Result Date: 02/04/2023 CLINICAL DATA:  Swelling left leg EXAM: Left LOWER EXTREMITY VENOUS DOPPLER ULTRASOUND TECHNIQUE: Gray-scale sonography with compression, as well as color and duplex ultrasound, were performed to evaluate the deep venous system(s) from the level of the common femoral vein through the popliteal and proximal calf veins. COMPARISON:  None Available. FINDINGS: VENOUS Normal compressibility of the common femoral, superficial femoral, and popliteal veins, as well as the visualized calf veins. Visualized portions of profunda femoral vein and great saphenous vein unremarkable. No filling defects to suggest DVT on grayscale or color Doppler imaging. Doppler waveforms show normal direction of venous flow, normal respiratory plasticity and response to augmentation. Limited views of the contralateral common femoral vein are unremarkable. OTHER Of note there is some slow flow in the left popliteal vein, nonspecific. This vein also is distended. Limitations: none IMPRESSION: No evidence of left lower extremity DVT. Nonspecific finding of distended left popliteal vein with slow flow Electronically Signed   By: Karen Kays M.D.   On: 02/04/2023 10:15   CT SOFT TISSUE NECK W CONTRAST  Result Date: 01/27/2023 CLINICAL DATA:  Lymphoma, ongoing chemo. EXAM: CT NECK WITH CONTRAST TECHNIQUE: Multidetector CT imaging of the neck was performed using the standard protocol following the bolus administration of intravenous contrast. RADIATION DOSE  REDUCTION: This exam was performed according to the departmental dose-optimization program which includes automated exposure control, adjustment of the mA and/or kV according to patient size and/or use of iterative reconstruction technique. CONTRAST:  OMNIPAQUE IOHEXOL 300 MG/ML  SOLN COMPARISON:  CT neck 04/11/2022 FINDINGS: Pharynx and larynx: The nasal cavity and nasopharynx are unremarkable. The oral cavity and oropharynx are unremarkable. The parapharyngeal spaces are clear. The hypopharynx and larynx are unremarkable. The vocal folds are normal in appearance. The epiglottis is normal. There is no retropharyngeal collection. The airway is patent. Salivary glands: The parotid and submandibular glands are unremarkable. Thyroid: Unremarkable. Lymph nodes: Multiple enlarged lymph nodes are again seen: *The 2.2 cm short axis right level IB lymph node is increased in bulk in the coronal plane (4-43, 2-57). *1.8 cm short axis right level IIA lymph node is slightly increased in size from 1.4 cm (2-48). *1.5 cm short axis right level IIB lymph node more posteriorly is increased in size from 1.1 cm (2-41). *Multiple level III and IV lymph nodes are also overall slightly increased in size. *1.0 cm left level IIA lymph node is slightly increased in size from 0.9 cm (2-49). *The 1.4 cm short axis left level IIA/III node is increased in size from 1.0 cm (2-64). 1.3 cm left level III node is similar in size (2-69). 1.2 cm left level IV node is stable to slightly increased in size (2-81). *Partially imaged bilateral subpectoral lymphadenopathy appears increased. Vascular: The major vasculature of the neck is unremarkable. Limited intracranial: Unremarkable. Visualized orbits: Bilateral lens implants are in place. The imaged globes and orbits are otherwise unremarkable. Mastoids and visualized paranasal sinuses: There is mild mucosal thickening in the left maxillary sinus. The mastoid air cells and middle ear cavities are  clear. There is probable cerumen in both external auditory canals. Skeleton: There is no acute osseous abnormality or suspicious osseous lesion. Upper chest: Reported separately. Other: The previously seen calcified inclusion cyst in the left neck is decreased in size. IMPRESSION: Since the CT neck from 04/11/2022, bilateral cervical lymphadenopathy has worsened, concerning for disease progression. Electronically Signed   By: Lesia Hausen M.D.   On: 01/27/2023 10:34  CT CHEST ABDOMEN PELVIS W CONTRAST  Result Date: 01/24/2023 CLINICAL DATA:  History of lymphoma, monitor. * Tracking Code: BO *. EXAM: CT CHEST, ABDOMEN, AND PELVIS WITH CONTRAST TECHNIQUE: Multidetector CT imaging of the chest, abdomen and pelvis was performed following the standard protocol during bolus administration of intravenous contrast. RADIATION DOSE REDUCTION: This exam was performed according to the departmental dose-optimization program which includes automated exposure control, adjustment of the mA and/or kV according to patient size and/or use of iterative reconstruction technique. CONTRAST:  OMNIPAQUE IOHEXOL 300 MG/ML  SOLN COMPARISON:  Multiple priors including most recent CT April 11, 2022. FINDINGS: CT CHEST FINDINGS Cardiovascular: Normal caliber thoracic aorta. No central pulmonary embolus on this nondedicated study. Coronary artery calcifications. Normal size heart. No significant pericardial effusion/thickening. Mediastinum/Nodes: Increased size of thoracic inlet, mediastinal, axillary and subpectoral lymph nodes. For reference: -thoracic inlet lymph node measures 15 mm in short axis on image 11/2 previously 7 mm. -right subpectoral lymph node measures 13 mm in short axis on image 15/2, previously 6 mm. -paraesophageal lymph node measures 16 mm in short axis on image 25/2 previously 5 mm. No suspicious thyroid nodule. The esophagus is grossly unremarkable. Lungs/Pleura: Increased size of some of the scattered  bilateral pulmonary nodules with other stable in size from prior. For reference: -right upper lobe perifissural pulmonary nodule measures 10 mm on image 84/3 previously 7 mm -right upper lobe perifissural pulmonary nodule measures 10 mm on image 88/3 previously 5 mm. -left lower lobe subpleural pulmonary nodule measures 6 mm on image 112/3 previously 4 mm. -right lower lobe pulmonary nodule measures 7 mm on image 108/3, unchanged. No pleural effusion.  No pneumothorax. Musculoskeletal: No aggressive lytic or blastic lesion of bone. CT ABDOMEN PELVIS FINDINGS Hepatobiliary: No suspicious hepatic lesion. Gallbladder is unremarkable. No biliary ductal dilation. Pancreas: No pancreatic ductal dilation or evidence of acute inflammation. Spleen: Splenomegaly measuring 18.4 cm in maximum axial dimension. Adrenals/Urinary Tract: Bilateral adrenal glands appear normal. Fluid signal 6.7 cm left upper pole renal cyst. Kidneys demonstrate symmetric enhancement. Urinary bladder is unremarkable for degree of distension. Stomach/Bowel: No pathologic dilation of small or large bowel. Colonic diverticulosis without findings of acute diverticulitis. No evidence of acute bowel inflammation. Vascular/Lymphatic: Increased size and number of the retrocrural, retroperitoneal, right upper quadrant, left-greater-than-right iliac side chain and inguinal lymph nodes. For reference: -left retrocrural lymph node measures 2.2 cm in short axis on image 59/2 previously 1.6 cm -left periaortic lymph node conglomerate measures 6.5 cm in short axis on image 81/2 -left external iliac lymph node measures 3.6 cm in short axis on image 121/2 previously 1.7 cm. Aortic atherosclerosis. Effacement of the abdominal aorta by retroperitoneal adenopathy. The portal, splenic and superior mesenteric veins are patent. Reproductive: Prostate gland is unremarkable. Other: Small fat containing right inguinal hernia. Musculoskeletal: Chronic bilateral L5 pars defects.  No aggressive lytic or blastic lesion of bone. Multilevel degenerative change of the spine. IMPRESSION: 1. Increased adenopathy above and below the diaphragm with new splenomegaly compatible with worsening lymphomatous disease. 2. Increased size of some of the scattered bilateral pulmonary nodules, suggestive of lymphomatous disease involvement. 3.  Aortic Atherosclerosis (ICD10-I70.0). These results will be called to the ordering clinician or representative by the Radiologist Assistant, and communication documented in the PACS or [employer]. Electronically Signed   By: Maudry Mayhew M.D.   On: 01/24/2023 15:57    Labs:  CBC: Recent Labs    01/23/23 1011 02/12/23 0919 02/12/23 1054 02/13/23 0402  WBC 6.9  7.6 7.5 7.0  HGB 14.1 14.0 12.8* 11.8*  HCT 41.8 41.9 37.9* 35.4*  PLT 120* 157 149* 120*    COAGS: Recent Labs    02/12/23 1054  INR 1.2  APTT 28    BMP: Recent Labs    01/23/23 1011 02/12/23 0919 02/12/23 1054 02/13/23 0402  NA 135 133* 131* 134*  K 3.8 3.6 3.5 3.0*  CL 104 96* 96* 98  CO2 24 22 22 23   GLUCOSE 236* 118* 114* 91  BUN 29* 24* 25* 28*  CALCIUM 8.9 9.4 8.9 8.8*  CREATININE 1.17 1.30* 1.48* 1.15  GFRNONAA >60 >60 52* >60    LIVER FUNCTION TESTS: Recent Labs    01/23/23 1011 02/12/23 0919 02/12/23 1054 02/13/23 0402  BILITOT 1.8* 4.6* 3.7* 2.6*  AST 21 49* 45* 41  ALT 21 49* 45* 44  ALKPHOS 62 107 96 96  PROT 6.4* 6.6 5.8* 5.3*  ALBUMIN 4.1 3.9 3.4* 3.1*    TUMOR MARKERS: No results for input(s): "AFPTM", "CEA", "CA199", "CHROMGRNA" in the last 8760 hours.  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   NPO since [facility] VSS Not on AC/AP in the hospital, was on ASA 81 mg last does 7/31 - ASA can be resumed tomorrow PM  INR 1.2 yesterday  NKDA   Risks and benefits of RP LN bx  was discussed with the patient and/or patient's family including,  but not limited to bleeding, infection, damage to adjacent structures or low yield requiring additional tests.  All of the questions were answered and there is agreement to proceed.  Consent signed and in chart.   Thank you for this interesting consult.  I greatly enjoyed meeting Adam Reyes and look forward to participating in their care.  A copy of this report was sent to the requesting provider on this date.  Electronically Signed: Willette Brace, PA-C 02/13/2023, 10:47 AM   I spent a total of 40 Minutes    in face to face in clinical consultation, greater than 50% of which was counseling/coordinating care for RP LN bx.   This chart was dictated using voice recognition software.  Despite best efforts to proofread,  errors can occur which can change the documentation meaning.

## 2023-02-13 NOTE — ED Notes (Signed)
ED TO INPATIENT HANDOFF REPORT  ED Nurse Name and Phone #: Tori [phone] S Name/Age/Gender Adam Reyes 67 y.o. male Room/Bed: TRACC/TRACC  Code Status   Code Status: Full Code  Home/SNF/Other Home Patient oriented to: self, place, time, and situation Is this baseline? Yes   Triage Complete: Triage complete  Chief Complaint Hypotension [I95.9] Atrial fibrillation with RVR (HCC) [I48.91]  Triage Note Pt [facility] from [facility] due to profusely sweating and hypotension with SBP in the 90s per Cancer Center nurse. Pt reports he was seeing the Cancer Center to review his PET scan and all of a sudden felt very weak, lightheaded and sweating like he was going to pass out. Pt never had LOC but felt like he was pretty close to it. Pt also c/o lower abdominal pain and down into his left groin and into leg which pt reports the Oncologist says is from the cancer.   Pt went to [facility] yesterday for neaar syncope. Pt had lymph node biopsy today and became pale, diaphoretic, hypotensive. Pt was in afib at 150. Axox4    Allergies No Known Allergies  Level of Care/Admitting Diagnosis ED Disposition     ED Disposition  Admit   Condition  --   Comment  Hospital [facility] [[id number]]  Level of Care: Telemetry Medical [[id number]]  May admit patient to [facility] or [facility] if equivalent level of care is available:: No  Covid Evaluation: Asymptomatic - no recent exposure (last 10 days) testing not required  Diagnosis: Atrial fibrillation with RVR [facility]) [[id number]]  Admitting Physician: Cleora Fleet [[id number]]  Attending Physician: Cleora Fleet [[id number]]  Certification:: I certify this patient will need inpatient services for at least 2 midnights          B Medical/Surgery History Past Medical History:  Diagnosis Date   Chronic lymphocytic leukemia (CLL), B-cell (HCC)    Small Cell Lymphoma   Coronary artery disease, non-occlusive 02/2017    Cardiac cath in setting of MI: 50 and 55% bifurcation LAD-Diag1   Enlarged lymph node    left neck   Hypertension    STEMI (ST elevation myocardial infarction) (HCC) 03/05/2017   hx/notes 03/05/2017 -likely aborted anterior STEMI with 50% bifurcation LAD-Diag1.  No PCI.  Preserved EF   Syncope due to orthostatic hypotension 04/21/2018   Past Surgical History:  Procedure Laterality Date   BIOPSY  10/10/2020   Procedure: BIOPSY;  Surgeon: Dolores Frame, MD;  [facility];  Service: Gastroenterology;;   BIOPSY  11/14/2020   Procedure: BIOPSY;  Surgeon: Dolores Frame, MD;  [facility];  Service: Gastroenterology;;   COLONOSCOPY N/A 10/11/2015   Procedure: COLONOSCOPY;  Surgeon: Malissa Hippo, MD;  [facility];  Service: Endoscopy;  Laterality: N/A;  10/11/2015   COLONOSCOPY WITH PROPOFOL N/A 10/10/2020   Procedure: COLONOSCOPY WITH PROPOFOL;  Surgeon: Dolores Frame, MD;  [facility];  Service: Gastroenterology;  Laterality: N/A;  AM   ENTEROSCOPY N/A 11/14/2020   Procedure: push enteroscopy;  Surgeon: Dolores Frame, MD;  [facility];  Service: Gastroenterology;  Laterality: N/A;   ESOPHAGOGASTRODUODENOSCOPY (EGD) WITH PROPOFOL N/A 10/10/2020   Procedure: ESOPHAGOGASTRODUODENOSCOPY (EGD) WITH PROPOFOL;  Surgeon: Dolores Frame, MD;  [facility];  Service: Gastroenterology;  Laterality: N/A;   ESOPHAGOGASTRODUODENOSCOPY (EGD) WITH PROPOFOL N/A 11/14/2020   Procedure: ESOPHAGOGASTRODUODENOSCOPY (EGD) WITH PROPOFOL;  Surgeon: Dolores Frame, MD;  [facility];  Service: Gastroenterology;  Laterality: N/A;  12:00   GIVENS CAPSULE STUDY N/A 11/07/2020   Procedure: GIVENS CAPSULE STUDY;  Surgeon: Dolores Frame, MD;  [facility];  Service: Gastroenterology;  Laterality: N/A;  7:30 am   Graded Exercise Tolerance Test (GXT/ETT)  05/2017    10.7 METs (9: 25 min.  Reached 103% max predicted heart rate).  No EKG findings to suggest coronary ischemia.  Negative, low risk GXT   HEMOSTASIS CLIP PLACEMENT  11/14/2020   Procedure: HEMOSTASIS CLIP PLACEMENT;  Surgeon: Dolores Frame, MD;  [facility];  Service: Gastroenterology;;  small bowel nodule   LEFT HEART CATH AND CORONARY ANGIOGRAPHY N/A 03/05/2017   Procedure: LEFT HEART CATH AND CORONARY ANGIOGRAPHY;  Surgeon: Marykay Lex, MD;  Location: [facility] INVASIVE CV LAB;  Service: Cardiovascular: pLAD-Diag1 50-55% (non-flow-limiiting).  EF ~50-55%.  although bifurcation lesion was presumed Culprit - no PCI (not flow limiting). - Med Rx.   MASS BIOPSY Left 06/27/2015   Procedure: OPEN LEFT NECK BIOPSY ;  Surgeon: Newman Pies, MD;  Location: [facility];  Service: ENT;  Laterality: Left;   POLYPECTOMY  10/10/2020   Procedure: POLYPECTOMY;  Surgeon: Marguerita Merles, Reuel Boom, MD;  [facility];  Service: Gastroenterology;;   REFRACTIVE SURGERY Bilateral      A IV Location/Drains/Wounds Patient Lines/Drains/Airways Status     Active Line/Drains/Airways     Name Placement date Placement time Site Days   Peripheral IV 02/12/23 20 G 1" Right;Lateral Forearm 02/12/23  1028  Forearm  1   Airway 10/10/20  0724  -- 856   Airway 10/10/20  0724  -- 856            Intake/Output Last 24 hours  Intake/Output Summary (Last 24 hours) at 02/13/2023 1440 Last data filed at 02/13/2023 1100 Gross per 24 hour  Intake 240 ml  Output 535 ml  Net -295 ml    Labs/Imaging Results for orders placed or performed during the hospital encounter of 02/12/23 (from the past 48 hour(s))  Lactic acid, plasma     Status: Abnormal   Collection Time: 02/12/23 10:54 AM  Result Value Ref Range   Lactic Acid, Venous 3.1 (HH) 0.5 - 1.9 mmol/L    Comment: CRITICAL RESULT CALLED TO, READ BACK BY AND VERIFIED WITH WHITE,M AT 11:10AM ON 02/12/23 BY [facility] Performed at  [facility], [address] [place], [place] [address]   Comprehensive metabolic panel     Status: Abnormal   Collection Time: 02/12/23 10:54 AM  Result Value Ref Range   Sodium 131 (L) 135 - 145 mmol/L   Potassium 3.5 3.5 - 5.1 mmol/L   Chloride 96 (L) 98 - 111 mmol/L   CO2 22 22 - 32 mmol/L   Glucose, Bld 114 (H) 70 - 99 mg/dL    Comment: Glucose reference range applies only to samples taken after fasting for at least 8 hours.   BUN 25 (H) 8 - 23 mg/dL   Creatinine, Ser 9.62 (H) 0.61 - 1.24 mg/dL   Calcium 8.9 8.9 - 95.2 mg/dL   Total Protein 5.8 (L) 6.5 - 8.1 g/dL   Albumin 3.4 (L) 3.5 - 5.0 g/dL   AST 45 (H) 15 - 41 U/L   ALT 45 (H) 0 - 44 U/L   Alkaline Phosphatase 96 38 - 126 U/L   Total Bilirubin 3.7 (H) 0.3 - 1.2 mg/dL   GFR, Estimated 52 (L) >60  mL/min    Comment: (NOTE) Calculated using the CKD-EPI Creatinine Equation (2021)    Anion gap 13 5 - 15    Comment: Performed at [facility], [address], [place], [place] [address]  CBC with Differential     Status: Abnormal   Collection Time: 02/12/23 10:54 AM  Result Value Ref Range   WBC 7.5 4.0 - 10.5 K/uL   RBC 3.91 (L) 4.22 - 5.81 MIL/uL   Hemoglobin 12.8 (L) 13.0 - 17.0 g/dL   HCT 60.4 (L) 54.0 - 98.1 %   MCV 96.9 80.0 - 100.0 fL   MCH 32.7 26.0 - 34.0 pg   MCHC 33.8 30.0 - 36.0 g/dL   RDW 19.1 47.8 - 29.5 %   Platelets 149 (L) 150 - 400 K/uL   nRBC 0.0 0.0 - 0.2 %   Neutrophils Relative % 75 %   Neutro Abs 5.7 1.7 - 7.7 K/uL   Band Neutrophils 1 %   Lymphocytes Relative 14 %   Lymphs Abs 1.1 0.7 - 4.0 K/uL   Monocytes Relative 6 %   Monocytes Absolute 0.5 0.1 - 1.0 K/uL   Eosinophils Relative 2 %   Eosinophils Absolute 0.2 0.0 - 0.5 K/uL   Basophils Relative 2 %   Basophils Absolute 0.2 (H) 0.0 - 0.1 K/uL   WBC Morphology TOXIC GRANULATION    Smear Review MORPHOLOGY UNREMARKABLE    Abs Immature Granulocytes 0.00 0.00 - 0.07 K/uL   Polychromasia PRESENT     Comment: Performed at [facility],  [address] [place], [place] [address]  Protime-INR     Status: Abnormal   Collection Time: 02/12/23 10:54 AM  Result Value Ref Range   Prothrombin Time 15.5 (H) 11.4 - 15.2 seconds   INR 1.2 0.8 - 1.2    Comment: (NOTE) INR goal varies based on device and disease states. Performed at [facility], [address], [place], [place] [address]   APTT     Status: None   Collection Time: 02/12/23 10:54 AM  Result Value Ref Range   aPTT 28 24 - 36 seconds    Comment: Performed at [facility], [address], [place], [place] [address]  Blood Culture (routine x 2)     Status: None (Preliminary result)   Collection Time: 02/12/23 10:54 AM   Specimen: BLOOD  Result Value Ref Range   Specimen Description BLOOD BLOOD RIGHT HAND    Special Requests      BOTTLES DRAWN AEROBIC AND ANAEROBIC Blood Culture results may not be optimal due to an excessive volume of blood received in culture bottles   Culture      NO GROWTH < 24 HOURS Performed at [facility], [address], [place], [place] [address]    Report Status PENDING   Blood Culture (routine x 2)     Status: None (Preliminary result)   Collection Time: 02/12/23 10:54 AM   Specimen: BLOOD  Result Value Ref Range   Specimen Description BLOOD BLOOD LEFT ARM    Special Requests      BOTTLES DRAWN AEROBIC AND ANAEROBIC Blood Culture adequate volume   Culture      NO GROWTH < 24 HOURS Performed at [facility], [address] [place], [place] [address]    Report Status PENDING   Lactic acid, plasma     Status: None   Collection Time: 02/12/23  1:03 PM  Result Value Ref Range   Lactic Acid, Venous 1.7 0.5 - 1.9 mmol/L  Comment: Performed at [facility], [address] [place], [place] [address]  Urinalysis, w/ Reflex to Culture (Infection Suspected) -Urine, Clean Catch     Status: Abnormal   Collection Time: 02/12/23  8:23 PM  Result Value Ref Range   Specimen Source URINE, CLEAN CATCH    Color, Urine AMBER (A) YELLOW    Comment:  BIOCHEMICALS MAY BE AFFECTED BY COLOR   APPearance HAZY (A) CLEAR   Specific Gravity, Urine 1.044 (H) 1.005 - 1.030   pH 6.0 5.0 - 8.0   Glucose, UA NEGATIVE NEGATIVE mg/dL   Hgb urine dipstick MODERATE (A) NEGATIVE   Bilirubin Urine SMALL (A) NEGATIVE   Ketones, ur NEGATIVE NEGATIVE mg/dL   Protein, ur 621 (A) NEGATIVE mg/dL   Nitrite POSITIVE (A) NEGATIVE   Leukocytes,Ua NEGATIVE NEGATIVE   RBC / HPF 6-10 0 - 5 RBC/hpf   WBC, UA 11-20 0 - 5 WBC/hpf    Comment:        Reflex urine culture not performed if WBC <=10, OR if Squamous epithelial cells >5. If Squamous epithelial cells >5 suggest recollection.    Bacteria, UA FEW (A) NONE SEEN   Squamous Epithelial / HPF 0-5 0 - 5 /HPF   Mucus PRESENT     Comment: Performed at [facility], [address], [place], [place] [address]  Comprehensive metabolic panel     Status: Abnormal   Collection Time: 02/13/23  4:02 AM  Result Value Ref Range   Sodium 134 (L) 135 - 145 mmol/L   Potassium 3.0 (L) 3.5 - 5.1 mmol/L   Chloride 98 98 - 111 mmol/L   CO2 23 22 - 32 mmol/L   Glucose, Bld 91 70 - 99 mg/dL    Comment: Glucose reference range applies only to samples taken after fasting for at least 8 hours.   BUN 28 (H) 8 - 23 mg/dL   Creatinine, Ser 7.84 0.61 - 1.24 mg/dL   Calcium 8.8 (L) 8.9 - 10.3 mg/dL   Total Protein 5.3 (L) 6.5 - 8.1 g/dL   Albumin 3.1 (L) 3.5 - 5.0 g/dL   AST 41 15 - 41 U/L   ALT 44 0 - 44 U/L   Alkaline Phosphatase 96 38 - 126 U/L   Total Bilirubin 2.6 (H) 0.3 - 1.2 mg/dL   GFR, Estimated >69 >62 mL/min    Comment: (NOTE) Calculated using the CKD-EPI Creatinine Equation (2021)    Anion gap 13 5 - 15    Comment: Performed at [facility], [address], [place], [place] [address]  Magnesium     Status: None   Collection Time: 02/13/23  4:02 AM  Result Value Ref Range   Magnesium 2.0 1.7 - 2.4 mg/dL    Comment: Performed at [facility], [address] [place], [place] [address]  CBC     Status: Abnormal    Collection Time: 02/13/23  4:02 AM  Result Value Ref Range   WBC 7.0 4.0 - 10.5 K/uL   RBC 3.62 (L) 4.22 - 5.81 MIL/uL   Hemoglobin 11.8 (L) 13.0 - 17.0 g/dL   HCT 01.0 (L) 27.2 - 53.6 %   MCV 97.8 80.0 - 100.0 fL   MCH 32.6 26.0 - 34.0 pg   MCHC 33.3 30.0 - 36.0 g/dL   RDW 64.4 03.4 - 74.2 %   Platelets 120 (L) 150 - 400 K/uL   nRBC 0.0 0.0 - 0.2 %    Comment: Performed at [facility], [address], [place], [place] [address]   CT ABDOMEN LIMITED WO CONTRAST  Result Date: 02/13/2023 CLINICAL DATA:  Retroperitoneal biopsy, procedure deferred due to onset of AFib * Tracking Code: BO * EXAM: CT ABDOMEN WITHOUT CONTRAST LIMITED TECHNIQUE: Multidetector CT imaging of the abdomen was performed following the standard protocol without IV contrast. RADIATION DOSE REDUCTION: This exam was performed according to the departmental dose-optimization program which includes automated exposure control, adjustment of the mA and/or kV according to patient size and/or use of iterative reconstruction technique. COMPARISON:  CT abdomen pelvis, 02/12/2023 FINDINGS: Hepatobiliary: No solid liver abnormality is seen. No gallstones, gallbladder wall thickening, or biliary dilatation. Pancreas: Unremarkable. No pancreatic ductal dilatation or surrounding inflammatory changes. Spleen: Gross splenomegaly, maximum span 20.3 cm. Adrenals/Urinary Tract: Adrenal glands are unremarkable. Simple, benign left renal cortical cysts, for which no further follow-up or characterization is required kidneys are otherwise normal, without renal calculi, solid lesion, or hydronephrosis. Stomach/Bowel: Stomach is within normal limits. No evidence of bowel wall thickening, distention, or inflammatory changes. Descending colonic diverticulosis. Vascular/Lymphatic: Aortic atherosclerosis. Unchanged bulky retrocrural, retroperitoneal, and bilateral iliac lymphadenopathy, with numerous additional prominent lymph nodes throughout the bowel mesentery.  Other: No abdominal wall hernia or abnormality. No ascites. Musculoskeletal: No acute or significant osseous findings. IMPRESSION: 1. Unchanged bulky retrocrural, retroperitoneal, and bilateral iliac lymphadenopathy, with numerous additional prominent lymph nodes throughout the bowel mesentery. 2. Gross splenomegaly, maximum span 20.3 cm. 3. Findings are unchanged in comparison to prior day's examination and most consistent with lymphoma. No acute findings. 4. Descending colonic diverticulosis without evidence of acute diverticulitis. Aortic Atherosclerosis (ICD10-I70.0). Electronically Signed   By: Jearld Lesch M.D.   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 Right lower quadrant abdominal pain. 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 * Tracking Code: BO * EXAM: CT ABDOMEN AND PELVIS WITH CONTRAST TECHNIQUE: Multidetector CT imaging of the abdomen and pelvis was performed using the standard protocol following bolus administration of intravenous contrast. RADIATION DOSE REDUCTION: This exam was performed according to the departmental dose-optimization program which includes automated exposure control, adjustment of the mA and/or kV according to patient size and/or use of iterative reconstruction technique. CONTRAST:  OMNIPAQUE IOHEXOL 300 MG/ML  SOLN COMPARISON:  PET-CT 02/06/2023. CT of the chest, abdomen and pelvis 01/24/2023 and 04/11/2022. FINDINGS: Lower chest: Stable linear atelectasis or scarring at both lung bases. Small pulmonary nodules bilaterally are unchanged from recent prior imaging, measuring up to 8 mm in the right lower lobe and 9 mm in the left lower lobe, both on image 9/5. There is progressive subcarinal and distal paraesophageal adenopathy as seen on recent PET-CT. A distal right paraesophageal node measures up to 5.4 x 4.6 cm on image 14/2. Hepatobiliary: The  liver is normal in density without suspicious focal abnormality. No evidence of gallstones, gallbladder wall thickening or biliary dilatation. Pancreas: Unremarkable. No pancreatic ductal dilatation or surrounding inflammatory changes. Spleen: Progressive splenomegaly compared with recent CT. No focal abnormality identified. Adrenals/Urinary Tract: Both adrenal glands appear normal. No evidence of urinary tract calculus, suspicious renal lesion or hydronephrosis. Unchanged cyst in the mid left kidney for which no follow-up imaging is recommended. Stable bladder compression by the bilateral pelvic lymphadenopathy. No intrinsic bladder abnormality identified. Stomach/Bowel: No enteric contrast administered. The stomach appears unremarkable for its degree of distension. No evidence of bowel wall thickening, distention or surrounding inflammatory change. Vascular/Lymphatic: Again demonstrated is extensive  retroperitoneal, pelvic and bilateral inguinal adenopathy. No significant change is seen from the recent PET-CT, although some of the lymph nodes have enlarged from the diagnostic CT done 01/24/2023. For example, there is a left external iliac node measuring 4.5 cm short axis on image 86/2 which previously measured 3.7 cm. A presacral node measuring 3.1 cm on image 72/2 previously measured 2.3 cm. No nodal necrosis identified. Mild aortic and branch vessel atherosclerosis without evidence of aneurysm or large vessel occlusion. Reproductive: The prostate gland appears mildly enlarged, but stable. Other: No evidence of abdominal wall mass or hernia. No ascites or pneumoperitoneum. Musculoskeletal: No acute or significant osseous findings. There is multilevel spondylosis with a grade 1 anterolisthesis at L5-S1 secondary to underlying pars defects. Associated prominent foraminal narrowing in the lower lumbar spine, unchanged. Unless specific follow-up recommendations are mentioned in the findings or impression sections, no  imaging follow-up of any mentioned incidental findings is recommended. IMPRESSION: 1. Known extensive adenopathy in the lower chest, abdomen and pelvis has mildly progressed compared with the CTs of less than 3 weeks earlier consistent with progressive lymphoma. 2. Progressive splenomegaly. 3. Stable small pulmonary nodules at both lung bases. 4. No definite acute findings. 5. 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 Associated prominent foraminal narrowing in the lower lumbar spine. 6.  Aortic Atherosclerosis (ICD10-I70.0). Electronically Signed   By: Carey Bullocks M.D.   On: 02/12/2023 12:16   DG Chest Port 1 View  Result Date: 02/12/2023 CLINICAL DATA:  Sepsis.  History of lymphoma EXAM: PORTABLE CHEST 1 VIEW COMPARISON:  PET-CT 02/06/2023 FINDINGS: Subtle bandlike opacity right lung base. Atelectasis is favored. No consolidation, pneumothorax or effusion. No edema. Normal cardiopericardial silhouette. Overlapping cardiac leads. IMPRESSION: Right basilar atelectasis.  No pneumothorax or effusion. Electronically Signed   By: Karen Kays M.D.   On: 02/12/2023 11:29    Pending Labs Unresulted Labs (From admission, onward)     Start     Ordered   02/14/23 0500  Magnesium  Tomorrow morning,   R        02/13/23 1337   02/13/23 0500  Comprehensive metabolic panel  Daily,   R      02/12/23 1504   02/13/23 0500  CBC  Daily,   R      02/12/23 1504   02/12/23 2023  Urine Culture  Once,   R        02/12/23 2023            Vitals/Pain Today's Vitals   02/13/23 1255 02/13/23 1338 02/13/23 1345 02/13/23 1355  BP:   106/82   Pulse:   (!) 127 (!) 135  Resp:   13 13  Temp:  97.6 F (36.4 C)    TempSrc:  Temporal    SpO2:   91% 92%  Weight: 121.6 kg     Height: 6\' 4"  (1.93 m)     PainSc: 0-No pain       Isolation Precautions No active isolations  Medications Medications  atorvastatin (LIPITOR) tablet 40 mg (40 mg Oral Given  02/12/23 1812)  metoprolol tartrate (LOPRESSOR) tablet 12.5 mg (12.5 mg Oral Given 02/13/23 0939)  lactated ringers infusion (0 mLs Intravenous Stopped 02/13/23 1336)  acetaminophen (TYLENOL) tablet 650 mg (has no administration in time range)    Or  acetaminophen (TYLENOL) suppository 650 mg (has no administration in time range)  fentaNYL (SUBLIMAZE) injection 12.5 mcg (has no administration in time range)  bisacodyl (  DULCOLAX) EC tablet 5 mg (has no administration in time range)  ondansetron (ZOFRAN) tablet 4 mg (has no administration in time range)    Or  ondansetron (ZOFRAN) injection 4 mg (has no administration in time range)  prochlorperazine (COMPAZINE) injection 10 mg (has no administration in time range)  albuterol (PROVENTIL) (2.5 MG/3ML) 0.083% nebulizer solution 2.5 mg (has no administration in time range)  guaiFENesin (MUCINEX) 12 hr tablet 600 mg (has no administration in time range)  HYDROcodone-acetaminophen (NORCO/VICODIN) 5-325 MG per tablet 1 tablet (has no administration in time range)  potassium chloride 10 mEq in 100 mL IVPB (0 mEq Intravenous Stopped 02/13/23 1336)  diltiazem (CARDIZEM) 1 mg/mL load via infusion 20 mg (20 mg Intravenous Bolus from Bag 02/13/23 1335)    And  diltiazem (CARDIZEM) 125 mg in dextrose 5% 125 mL (1 mg/mL) infusion (5 mg/hr Intravenous New Bag/Given 02/13/23 1335)  lactated ringers bolus 1,000 mL (0 mLs Intravenous Stopped 02/12/23 1231)  iohexol (OMNIPAQUE) 300 MG/ML solution 100 mL (100 mLs Intravenous Contrast Given 02/12/23 1053)    Mobility walks     Focused Assessments Cardiac Assessment Handoff:  Cardiac Rhythm: Normal sinus rhythm Lab Results  Component Value Date   TROPONINI 4.50 (HH) 03/05/2017   No results found for: "DDIMER" Does the Patient currently have chest pain? No    R Recommendations: See Admitting Provider Note  Report given to:   Additional Notes: Wife at bedside

## 2023-02-13 NOTE — Sedation Documentation (Signed)
Dr Yamagata at bedside. 

## 2023-02-13 NOTE — Progress Notes (Signed)
Overnight progress note  Notified by RN that patient remains in A-fib with RVR with rate in the 130s to 150s.  He is on Cardizem drip at 5 mg/hr and blood pressure has continued to drop, most recent 76/56.    Chart reviewed, patient went into new onset A-fib with RVR today as he was preparing to have biopsy done by IR.  He is not anticoagulated.   Cardizem drip discontinued and ordered 1 L normal saline bolus.    Discussed with on-call cardiologist Dr. Noemi Chapel, since onset of Afib is <24 hrs, she recommends starting amiodarone bolus and infusion.  She will see the patient, appreciate recommendations.  Will continue to monitor blood pressure closely, may need ICU transfer if patient remains hypotensive despite IV fluid resuscitation.

## 2023-02-13 NOTE — Progress Notes (Signed)
   02/13/23 1122  TOC Brief Assessment  Insurance and Status Reviewed  Patient has primary care physician Yes  Home environment has been reviewed with spouse  Prior level of function: independent  Prior/Current Home Services No current home services  Social Determinants of Health Reivew SDOH reviewed no interventions necessary  Readmission risk has been reviewed Yes  Transition of care needs no transition of care needs at this time   Transition of Care Department [facility]) has reviewed patient and no TOC needs have been identified at this time. We will continue to monitor patient advancement through interdisciplinary progression rounds. If new patient transition needs arise, please place a TOC consult.

## 2023-02-13 NOTE — Sedation Documentation (Signed)
Update given to Roanna Epley RN Room 310

## 2023-02-13 NOTE — Sedation Documentation (Signed)
Pt taken to ED; report given to Shann Medal, RN

## 2023-02-13 NOTE — Progress Notes (Signed)
Interventional Radiology Progress Note  Patient brought to CT for lymph node biopsy and noted to be in rapid atrial fibrillation with rate 140-150's, systolic BP in 90's and diaphoretic. Will be unable to safely sedate or proceed with CT guided iliac lymph node biopsy today. Will send to Emergency Department here for evaluation and treatment.  Jodi Marble. Fredia Sorrow, M.D Pager:  [phone]

## 2023-02-13 NOTE — ED Provider Notes (Signed)
Patient was evaluated any [facility] yesterday.  Patient was at the cancer center when he was noted to have hypotension.  Patient is being treated for lymphoma.  He felt like he was going to pass out.  Patient was evaluated in the emergency room at [facility].  He was noted to have dehydration and AKI weakness.  CT scan showed extensive adenopathy in the chest abdomen pelvis.  Patient was admitted to the hospital he was transferred down to [facility] to have urgent CT lymph node biopsy.  While being treated by interventional radiology here patient went into atrial fibrillation with rapid rate.  Patient was sent to the ED.  Patient denies any chest pain or shortness of breath.  He does feel his heart pounding. Physical Exam  BP (!) 130/59 (BP Location: Left Arm)   Pulse 77   Temp 98.3 F (36.8 C) (Oral)   Resp 18   Ht 1.93 m (6\' 4" )   Wt 121.6 kg   SpO2 96%   BMI 32.62 kg/m   Physical Exam General: Alert oriented Cardiac: Tachycardic irregular rhythm Lungs: Clear to auscultation Abdomen: Soft nontender Procedures  Procedures  ED Course / MDM    Medical Decision Making Amount and/or Complexity of Data Reviewed Labs: ordered. Radiology: ordered. ECG/medicine tests: ordered.  Risk Prescription drug management. Decision regarding hospitalization.   Patient is already an inpatient in the hospital but was transferred to [facility] for a procedure.  While here at the hospital for his procedure patient went into rapid A-fib.  EKG is consistent with A-fib rapid rate.  Will start Cardizem infusion.  I will consult the hospitalist service here as patient will likely need to be admitted to [facility] instead of transferring back to [facility]um, Cletis Athens, MD 02/14/23 [phone]

## 2023-02-14 ENCOUNTER — Other Ambulatory Visit (HOSPITAL_COMMUNITY): Payer: PPO

## 2023-02-14 ENCOUNTER — Other Ambulatory Visit: Payer: Self-pay | Admitting: Adult Health

## 2023-02-14 ENCOUNTER — Other Ambulatory Visit: Payer: Self-pay | Admitting: Cardiology

## 2023-02-14 ENCOUNTER — Inpatient Hospital Stay (HOSPITAL_COMMUNITY): Payer: PPO

## 2023-02-14 DIAGNOSIS — R5383 Other fatigue: Secondary | ICD-10-CM | POA: Diagnosis not present

## 2023-02-14 DIAGNOSIS — E785 Hyperlipidemia, unspecified: Secondary | ICD-10-CM

## 2023-02-14 DIAGNOSIS — C8300 Small cell B-cell lymphoma, unspecified site: Secondary | ICD-10-CM

## 2023-02-14 DIAGNOSIS — R748 Abnormal levels of other serum enzymes: Secondary | ICD-10-CM | POA: Diagnosis not present

## 2023-02-14 DIAGNOSIS — I4891 Unspecified atrial fibrillation: Secondary | ICD-10-CM

## 2023-02-14 DIAGNOSIS — I9589 Other hypotension: Secondary | ICD-10-CM

## 2023-02-14 DIAGNOSIS — I25119 Atherosclerotic heart disease of native coronary artery with unspecified angina pectoris: Secondary | ICD-10-CM

## 2023-02-14 DIAGNOSIS — R630 Anorexia: Secondary | ICD-10-CM

## 2023-02-14 DIAGNOSIS — E860 Dehydration: Secondary | ICD-10-CM

## 2023-02-14 DIAGNOSIS — N179 Acute kidney failure, unspecified: Secondary | ICD-10-CM | POA: Diagnosis not present

## 2023-02-14 DIAGNOSIS — R0989 Other specified symptoms and signs involving the circulatory and respiratory systems: Secondary | ICD-10-CM

## 2023-02-14 DIAGNOSIS — I493 Ventricular premature depolarization: Secondary | ICD-10-CM

## 2023-02-14 DIAGNOSIS — R739 Hyperglycemia, unspecified: Secondary | ICD-10-CM

## 2023-02-14 LAB — ECHOCARDIOGRAM COMPLETE
AV Mean grad: 48 mmHg
AV Peak grad: 93.7 mmHg
Ao pk vel: 4.84 m/s
Calc EF: 73.3 %
Height: 76 in
MV M vel: 4.9 m/s
MV Peak grad: 96 mmHg
P 1/2 time: 450 msec
S' Lateral: 3.1 cm
Single Plane A2C EF: 75.6 %
Single Plane A4C EF: 70.4 %
Weight: 4306.91 oz

## 2023-02-14 LAB — HEPARIN LEVEL (UNFRACTIONATED): Heparin Unfractionated: 0.15 IU/mL — ABNORMAL LOW (ref 0.30–0.70)

## 2023-02-14 LAB — CORTISOL: Cortisol, Plasma: 25.6 ug/dL

## 2023-02-14 LAB — TSH: TSH: 1.38 u[IU]/mL (ref 0.350–4.500)

## 2023-02-14 MED ORDER — HEPARIN (PORCINE) 25000 UT/250ML-% IV SOLN
2100.0000 [IU]/h | INTRAVENOUS | Status: DC
  Administered 2023-02-14: 1250 [IU]/h via INTRAVENOUS
  Administered 2023-02-15: 1700 [IU]/h via INTRAVENOUS
  Administered 2023-02-15: 1500 [IU]/h via INTRAVENOUS
  Administered 2023-02-16 – 2023-02-17 (×2): 1900 [IU]/h via INTRAVENOUS
  Filled 2023-02-14 (×5): qty 250

## 2023-02-14 MED ORDER — PERFLUTREN LIPID MICROSPHERE
1.0000 mL | INTRAVENOUS | Status: AC | PRN
  Administered 2023-02-14: 2 mL via INTRAVENOUS

## 2023-02-14 MED ORDER — AMIODARONE HCL IN DEXTROSE 360-4.14 MG/200ML-% IV SOLN
30.0000 mg/h | INTRAVENOUS | Status: DC
  Administered 2023-02-14 – 2023-02-16 (×5): 30 mg/h via INTRAVENOUS
  Filled 2023-02-14 (×5): qty 200

## 2023-02-14 MED ORDER — AMIODARONE HCL IN DEXTROSE 360-4.14 MG/200ML-% IV SOLN
60.0000 mg/h | INTRAVENOUS | Status: AC
  Administered 2023-02-14: 60 mg/h via INTRAVENOUS
  Filled 2023-02-14: qty 200

## 2023-02-14 MED ORDER — MIDODRINE HCL 5 MG PO TABS
5.0000 mg | ORAL_TABLET | Freq: Three times a day (TID) | ORAL | Status: DC
  Administered 2023-02-14 (×2): 5 mg via ORAL
  Filled 2023-02-14 (×2): qty 1

## 2023-02-14 MED ORDER — HEPARIN BOLUS VIA INFUSION
2500.0000 [IU] | Freq: Once | INTRAVENOUS | Status: AC
  Administered 2023-02-14: 2500 [IU] via INTRAVENOUS
  Filled 2023-02-14: qty 2500

## 2023-02-14 MED ORDER — POTASSIUM CHLORIDE CRYS ER 20 MEQ PO TBCR
40.0000 meq | EXTENDED_RELEASE_TABLET | Freq: Once | ORAL | Status: AC
  Administered 2023-02-14: 40 meq via ORAL
  Filled 2023-02-14: qty 2

## 2023-02-14 MED ORDER — LACTATED RINGERS IV SOLN: INTRAVENOUS | Status: DC

## 2023-02-14 MED ORDER — SODIUM CHLORIDE 0.9 % IV SOLN
2.0000 g | INTRAVENOUS | Status: DC
  Administered 2023-02-14 – 2023-02-16 (×3): 2 g via INTRAVENOUS
  Filled 2023-02-14 (×3): qty 20

## 2023-02-14 NOTE — Progress Notes (Signed)
PROGRESS NOTE  Adam Reyes UYQ:[id number] DOB: [birth year]   PCP: Anabel Halon, MD  Patient is from: Home.  Lives with his wife.  Independently ambulates at baseline.  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  Reportedly had poor p.o. intake, fatigue, diaphoresis, dizziness and myalgia before he went to cancer center.  There was concern about Richter's transformation of his malignancy.  His oncologist, Dr. Ellin Saba recommended transfer to [facility] for bone biopsy.   Hospital course complicated by new onset A-fib with RVR for which she was initially started on Cardizem drip but became hypotensive.  Cardiology consulted.  He was switched to IV amiodarone and underwent DCCV the night of 8/1-2 and converted to sinus rhythm.   Patient improved clinically.  Blood cultures NGTD.  Urine culture reintubated for better growth.  Empirically started on IV ceftriaxone for possible UTI.  Subjective: Seen and examined earlier this morning.  Patient underwent successful DCCV overnight.  Feels much better today.  He ambulated in the hallway without problem.  Denies chest pain, dyspnea, dizziness or GI symptoms.  Felt some dysuria although he attributes this to continuously lying in bed.  Objective: Vitals:   02/14/23 0400 02/14/23 0500 02/14/23 0800 02/14/23 1213  BP: (!) 107/54 (!) 100/55 (!) 97/52 108/60  Pulse: 79 80 86 93  Resp: 19 15 18 20   Temp:   97.6 F (36.4 C) 97.7 F (36.5 C)  TempSrc:   Oral Oral  SpO2:  95% 95% 93% 93%  Weight:  122.1 kg    Height:        Examination:  GENERAL: No apparent distress.  Nontoxic. HEENT: MMM.  Vision and hearing grossly intact.  NECK: Supple.  No apparent JVD.  RESP:  No IWOB.  Fair aeration bilaterally. CVS:  RRR. Heart sounds normal.  ABD/GI/GU: BS+. Abd soft, NTND.  MSK/EXT:  Moves extremities. No apparent deformity. No edema.  SKIN: no apparent skin lesion or wound NEURO: Awake, alert and oriented appropriately.  No apparent focal neuro deficit. PSYCH: Calm. Normal affect.   Procedures:  8/1-DCCV for A-fib with RVR.  Microbiology summarized: MRSA PCR screen negative Blood cultures NGTD Urine culture reintubated for better growth.  Assessment and plan: Principal Problem:   Hypotension Active Problems:   Lymphoma, small lymphocytic (HCC)   Question of Richter's transformation   AKI (acute kidney injury) (HCC)   Chronic lymphocytic leukemia (CLL), B-cell (HCC)   Coronary artery disease involving native coronary artery of native heart with angina pectoris (HCC)   Dyslipidemia, goal LDL below 70   Frequent PVCs   Poor appetite   Low energy   Elevated lactic acid level   Dehydration   Hyponatremia   Elevated liver enzymes   Hyperglycemia   Atrial fibrillation with RVR (HCC)   Hemodynamic instability  Acute Kidney Injury: Suspect prerenal in the setting of poor p.o. intake and hypotension.  Also on Micardis at home. Recent Labs    04/11/22 1423 05/29/22 0904 09/16/22 1510 01/23/23 1011 02/12/23 0919 02/12/23 1054 02/13/23 0402 02/14/23 0126  BUN 19 19 21  29* 24* 25* 28* 30*  CREATININE 1.03 1.09 1.13 1.17 1.30* 1.48* 1.15 1.36*  -Continue IV fluid -Continue monitoring -Avoid nephrotoxic meds   Hypotension: Likely due to dehydration and concurrent use of antihypertensive meds.  Resolved. -Continue holding antihypertensive meds -TTE, TSH and cortisol.  Generalized weakness: Likely due to the above.  Improved. -Treat  treatable causes  Urinary tract infection: Some dysuria that he attributes to lying in bed.  No fever or chills.  UA with nitrites.  Immunocompromised patient -IV ceftriaxone -Follow urine culture.  Reintubated for better growth.  New onset A-fib with RVR: Initially started on Cardizem drip but became hypotensive.  Successful DCCV the evening of 8/1.  CHA2DS2-VASc score at least 3. -Appreciate cardiology input-recommended amiodarone and Lovenox.  Currently on IV heparin. -Will clarify with cardiology about amiodarone -Optimize electrolytes -Check echocardiogram   CLL with small lymphocytic lymphoma: Followed by Dr. Ellin Saba.  Was out of ibrutinib for 4 days prior to presentation.  Concern about Richter's transformation. Transferred to [facility] for bone biopsy. -IR to see patient for bone biopsy   Dehydration: Improved. -Continue IV fluid hydration.   Hypokalemia -Monitor replenish as appropriate  Lactic Acidosis -Hydration   CAD/dyslipidemia: No anginal symptoms -Continue Lipitor and aspirin. -Watch LFT while on Lipitor.   Hyponatremia: Resolved.   Elevated liver Enzymes/mild hyperbilirubinemia: Mild.  Possibly from ibrutinib.  No significant hepatobiliary finding on CT. -Monitor. -Check HIV and acute hepatitis panel -Consider holding statin and checking RUQ [place] if worse.  Normocytic anemia/thrombocytopenia/splenomegaly: Likely from malignancy. -Monitor  Obesity Body mass index is 32.77 kg/m.           DVT prophylaxis:  SCDs Start: 02/12/23 1503 On full dose anticoagulation  Code Status: Full code Family Communication: Updated patient's wife at bedside Level of care: Telemetry Medical Status is: Inpatient Remains inpatient appropriate because: AKI, hypotension, generalized weakness, new onset A-fib   Final disposition: Home Consultants:  Oncology Cardiology Interventional radiology  55 minutes with more than 50% spent in reviewing records, counseling  patient/family and coordinating care.   Sch Meds:  Scheduled Meds:  atorvastatin  40 mg Oral QPM   midodrine  5 mg Oral TID WC   Continuous Infusions:  cefTRIAXone (ROCEPHIN)  IV 2 g (02/14/23 0840)   heparin     lactated ringers Stopped (02/13/23 1336)   PRN Meds:.acetaminophen **OR** acetaminophen, albuterol, bisacodyl, fentaNYL (SUBLIMAZE) injection, guaiFENesin, HYDROcodone-acetaminophen, ondansetron **OR** ondansetron (ZOFRAN) IV, perflutren lipid microspheres (DEFINITY) IV suspension, prochlorperazine  Antimicrobials: Anti-infectives (From admission, onward)    Start     Dose/Rate Route Frequency Ordered Stop   02/14/23 0800  cefTRIAXone (ROCEPHIN) 2 g in sodium chloride 0.9 % 100 mL IVPB        2 g 200 mL/hr over 30 Minutes Intravenous Every 24 hours 02/14/23 0718          I have personally reviewed the following labs and images: CBC: Recent Labs  Lab 02/12/23 0919 02/12/23 1054 02/13/23 0402 02/14/23 0126  WBC 7.6 7.5 7.0 8.3  NEUTROABS 5.1 5.7  --   --   HGB 14.0 12.8* 11.8* 12.3*  HCT 41.9 37.9* 35.4* 36.9*  MCV 97.4 96.9 97.8 95.6  PLT 157 149* 120* 146*   BMP &GFR Recent Labs  Lab 02/12/23 0919 02/12/23 1054 02/13/23 0402 02/14/23 0126  NA 133* 131* 134* 136  K 3.6 3.5 3.0* 3.5  CL 96* 96* 98 103  CO2 22 22 23 22   GLUCOSE 118* 114* 91 101*  BUN 24* 25* 28* 30*  CREATININE  1.30* 1.48* 1.15 1.36*  CALCIUM 9.4 8.9 8.8* 8.6*  MG 1.9  --  2.0 2.0   Estimated Creatinine Clearance: 77.3 mL/min (A) (by C-G formula based on SCr of 1.36 mg/dL (H)). Liver & Pancreas: Recent Labs  Lab 02/12/23 0919 02/12/23 1054 02/13/23 0402 02/14/23 0126  AST 49* 45* 41 45*  ALT 49* 45* 44 45*  ALKPHOS 107 96 96 89  BILITOT 4.6* 3.7* 2.6* 2.6*  PROT 6.6 5.8* 5.3* 4.9*  ALBUMIN 3.9 3.4* 3.1* 2.7*   No results for input(s): "LIPASE", "AMYLASE" in the last 168 hours. No results for input(s): "AMMONIA" in the last 168 hours. Diabetic: No results for input(s):  "HGBA1C" in the last 72 hours. Recent Labs  Lab 02/13/23 2226  GLUCAP 94   Cardiac Enzymes: No results for input(s): "CKTOTAL", "CKMB", "CKMBINDEX", "TROPONINI" in the last 168 hours. No results for input(s): "PROBNP" in the last 8760 hours. Coagulation Profile: Recent Labs  Lab 02/12/23 1054  INR 1.2   Thyroid Function Tests: No results for input(s): "TSH", "T4TOTAL", "FREET4", "T3FREE", "THYROIDAB" in the last 72 hours. Lipid Profile: No results for input(s): "CHOL", "HDL", "LDLCALC", "TRIG", "CHOLHDL", "LDLDIRECT" in the last 72 hours. Anemia Panel: No results for input(s): "VITAMINB12", "FOLATE", "FERRITIN", "TIBC", "IRON", "RETICCTPCT" in the last 72 hours. Urine analysis:    Component Value Date/Time   COLORURINE AMBER (A) 02/12/2023 2023   APPEARANCEUR HAZY (A) 02/12/2023 2023   LABSPEC 1.044 (H) 02/12/2023 2023   PHURINE 6.0 02/12/2023 2023   GLUCOSEU NEGATIVE 02/12/2023 2023   HGBUR MODERATE (A) 02/12/2023 2023   BILIRUBINUR SMALL (A) 02/12/2023 2023   KETONESUR NEGATIVE 02/12/2023 2023   PROTEINUR 100 (A) 02/12/2023 2023   NITRITE POSITIVE (A) 02/12/2023 2023   LEUKOCYTESUR NEGATIVE 02/12/2023 2023   Sepsis Labs: Invalid input(s): "PROCALCITONIN", "LACTICIDVEN"  Microbiology: Recent Results (from the past 240 hour(s))  Blood Culture (routine x 2)     Status: None (Preliminary result)   Collection Time: 02/12/23 10:54 AM   Specimen: BLOOD  Result Value Ref Range Status   Specimen Description BLOOD BLOOD RIGHT HAND  Final   Special Requests   Final    BOTTLES DRAWN AEROBIC AND ANAEROBIC Blood Culture results may not be optimal due to an excessive volume of blood received in culture bottles   Culture   Final    NO GROWTH 2 DAYS Performed at [facility], [address], [place], [place] [address]    Report Status PENDING  Incomplete  Blood Culture (routine x 2)     Status: None (Preliminary result)   Collection Time: 02/12/23 10:54 AM   Specimen: BLOOD   Result Value Ref Range Status   Specimen Description BLOOD BLOOD LEFT ARM  Final   Special Requests   Final    BOTTLES DRAWN AEROBIC AND ANAEROBIC Blood Culture adequate volume   Culture   Final    NO GROWTH 2 DAYS Performed at [facility], [address] [place], [place] [address]    Report Status PENDING  Incomplete  Urine Culture     Status: None (Preliminary result)   Collection Time: 02/12/23  8:23 PM   Specimen: Urine, Random  Result Value Ref Range Status   Specimen Description   Final    URINE, RANDOM Performed at [facility], [address] [place], [place] [address]    Special Requests   Final    NONE Reflexed from [id number] Performed at [facility], [address], [place], [place] [address]  Culture   Final    CULTURE REINCUBATED FOR BETTER GROWTH Performed at [facility], [address], [place], [place] [address]    Report Status PENDING  Incomplete  MRSA Next Gen by PCR, Nasal     Status: None   Collection Time: 02/13/23  4:40 PM   Specimen: Nasal Mucosa; Nasal Swab  Result Value Ref Range Status   MRSA by PCR Next Gen NOT DETECTED NOT DETECTED Final    Comment: (NOTE) The GeneXpert MRSA Assay (FDA approved for NASAL specimens only), is one component of a comprehensive MRSA colonization surveillance program. It is not intended to diagnose MRSA infection nor to guide or monitor treatment for MRSA infections. Test performance is not FDA approved in patients less than 34 years old. Performed at [facility], [address] [place], [place] [address]     Radiology Studies: ECHOCARDIOGRAM COMPLETE  Result Date: 02/14/2023    ECHOCARDIOGRAM REPORT   Patient Name:   TRAVUS OREN Date of Exam: 02/14/2023 Medical Rec #:  [id number]     Height:       76.0 in Accession #:    [id number]    Weight:       269.2 lb Date of Birth:  1956-05-19      BSA:          2.513 m Patient Age:    67 years      BP:           97/52 mmHg Patient Gender: M             HR:            85 bpm. Exam Location:  Inpatient Procedure: 2D Echo, Cardiac Doppler, Color Doppler and Intracardiac            Opacification Agent Indications:    Atrial fibrillation  History:        Patient has prior history of Echocardiogram examinations, most                 recent 07/24/2021. CAD and Previous Myocardial Infarction; Risk                 Factors:Hypertension. Lymphoma.  Sonographer:    Milda Smart Referring Phys: [id number] Boyce Medici   Sonographer Comments: Image acquisition challenging due to patient body habitus and Image acquisition challenging due to respiratory motion. IMPRESSIONS  1. Left ventricular ejection fraction, by estimation, is 70 to 75%. The left ventricle has hyperdynamic function. The left ventricle has no regional wall motion abnormalities. There is mild asymmetric left ventricular hypertrophy of the basal-septal segment. Peak LV outflow tract gradient 94 mmHg. Left ventricular diastolic parameters are consistent with Grade II diastolic dysfunction (pseudonormalization).  2. Right ventricular systolic function is normal. The right ventricular size is normal. Tricuspid regurgitation signal is inadequate for assessing PA pressure.  3. Left atrial size was mildly dilated.  4. There is mitral valve systolic anterior motion. The mitral valve is abnormal. Trivial mitral valve regurgitation. No evidence of mitral stenosis.  5. The aortic valve is tricuspid. Aortic valve regurgitation is mild to moderate. No aortic stenosis is present.  6. Aortic dilatation noted. There is mild dilatation of the ascending aorta, measuring 40 mm.  7. The inferior vena cava is normal in size with greater than 50% respiratory variability, suggesting right atrial pressure of 3 mmHg.  8. Vigorous LV systolic function with LVOT gradient and SAM. Patient has HOCM physiology. FINDINGS  Left  Ventricle: Left ventricular ejection fraction, by estimation, is 70 to 75%. The left ventricle has hyperdynamic function. The  left ventricle has no regional wall motion abnormalities. Definity contrast agent was given IV to delineate the left ventricular endocardial borders. The left ventricular internal cavity size was normal in size. There is mild asymmetric left ventricular hypertrophy of the basal-septal segment. Left ventricular diastolic parameters are consistent with Grade II diastolic  dysfunction (pseudonormalization). Right Ventricle: The right ventricular size is normal. No increase in right ventricular wall thickness. Right ventricular systolic function is normal. Tricuspid regurgitation signal is inadequate for assessing PA pressure. Left Atrium: Left atrial size was mildly dilated. Right Atrium: Right atrial size was normal in size. Pericardium: There is no evidence of pericardial effusion. Mitral Valve: There is mitral valve systolic anterior motion. The mitral valve is abnormal. Trivial mitral valve regurgitation. No evidence of mitral valve stenosis. MV peak gradient, 6.0 mmHg. The mean mitral valve gradient is 3.0 mmHg. Tricuspid Valve: The tricuspid valve is normal in structure. Tricuspid valve regurgitation is trivial. Aortic Valve: The aortic valve is tricuspid. Aortic valve regurgitation is mild to moderate. Aortic regurgitation PHT measures 450 msec. No aortic stenosis is present. Aortic valve mean gradient measures 48.0 mmHg. Aortic valve peak gradient measures 93.7 mmHg. Pulmonic Valve: The pulmonic valve was normal in structure. Pulmonic valve regurgitation is trivial. Aorta: The aortic root is normal in size and structure and aortic dilatation noted. There is mild dilatation of the ascending aorta, measuring 40 mm. Venous: The inferior vena cava is normal in size with greater than 50% respiratory variability, suggesting right atrial pressure of 3 mmHg. IAS/Shunts: No atrial level shunt detected by color flow Doppler.  LEFT VENTRICLE PLAX 2D LVIDd:         5.50 cm      Diastology LVIDs:         3.10 cm      LV e'  medial:    6.96 cm/s LV PW:         1.00 cm      LV E/e' medial:  10.6 LV IVS:        0.80 cm      LV e' lateral:   8.16 cm/s LVOT diam:     2.40 cm      LV E/e' lateral: 9.1 LVOT Area:     4.52 cm  LV Volumes (MOD) LV vol d, MOD A2C: 135.0 ml LV vol d, MOD A4C: 130.0 ml LV vol s, MOD A2C: 33.0 ml LV vol s, MOD A4C: 38.4 ml LV SV MOD A2C:     102.0 ml LV SV MOD A4C:     130.0 ml LV SV MOD BP:      97.9 ml RIGHT VENTRICLE RV Basal diam:  2.60 cm RV S prime:     21.10 cm/s TAPSE (M-mode): 2.5 cm LEFT ATRIUM             Index        RIGHT ATRIUM           Index LA diam:        3.60 cm 1.43 cm/m   RA Area:     14.20 cm LA Vol (A2C):   83.1 ml 33.06 ml/m  RA Volume:   31.00 ml  12.33 ml/m LA Vol (A4C):   84.0 ml 33.42 ml/m LA Biplane Vol: 90.8 ml 36.13 ml/m  AORTIC VALVE AV Vmax:      484.00 cm/s AV Vmean:  329.000 cm/s AV VTI:       0.756 m AV Peak Grad: 93.7 mmHg AV Mean Grad: 48.0 mmHg AI PHT:       450 msec  AORTA Ao Root diam: 3.60 cm Ao Asc diam:  4.00 cm MITRAL VALVE MV Peak grad: 6.0 mmHg     SHUNTS MV Mean grad: 3.0 mmHg     Systemic Diam: 2.40 cm MV Vmax:      1.22 m/s MV Vmean:     81.3 cm/s MR Peak grad: 96.0 mmHg MR Vmax:      490.00 cm/s MV E velocity: 73.90 cm/s MV A velocity: 89.90 cm/s MV E/A ratio:  0.82 Dalton McleanMD Electronically signed by Wilfred Lacy Signature Date/Time: 02/14/2023/11:17:54 AM    Final    CT ABDOMEN LIMITED WO CONTRAST  Result Date: 02/13/2023 CLINICAL DATA:  Retroperitoneal biopsy, procedure deferred due to onset of AFib * Tracking Code: BO * EXAM: CT ABDOMEN WITHOUT CONTRAST LIMITED TECHNIQUE: Multidetector CT imaging of the abdomen was performed following the standard protocol without IV contrast. RADIATION DOSE REDUCTION: This exam was performed according to the departmental dose-optimization program which includes automated exposure control, adjustment of the mA and/or kV according to patient size and/or use of iterative reconstruction technique. COMPARISON:   CT abdomen pelvis, 02/12/2023 FINDINGS: Hepatobiliary: No solid liver abnormality is seen. No gallstones, gallbladder wall thickening, or biliary dilatation. Pancreas: Unremarkable. No pancreatic ductal dilatation or surrounding inflammatory changes. Spleen: Gross splenomegaly, maximum span 20.3 cm. Adrenals/Urinary Tract: Adrenal glands are unremarkable. Simple, benign left renal cortical cysts, for which no further follow-up or characterization is required kidneys are otherwise normal, without renal calculi, solid lesion, or hydronephrosis. Stomach/Bowel: Stomach is within normal limits. No evidence of bowel wall thickening, distention, or inflammatory changes. Descending colonic diverticulosis. Vascular/Lymphatic: Aortic atherosclerosis. Unchanged bulky retrocrural, retroperitoneal, and bilateral iliac lymphadenopathy, with numerous additional prominent lymph nodes throughout the bowel mesentery. Other: No abdominal wall hernia or abnormality. No ascites. Musculoskeletal: No acute or significant osseous findings. IMPRESSION: 1. Unchanged bulky retrocrural, retroperitoneal, and bilateral iliac lymphadenopathy, with numerous additional prominent lymph nodes throughout the bowel mesentery. 2. Gross splenomegaly, maximum span 20.3 cm. 3. Findings are unchanged in comparison to prior day's examination and most consistent with lymphoma. No acute findings. 4. Descending colonic diverticulosis without evidence of acute diverticulitis. Aortic Atherosclerosis (ICD10-I70.0). Electronically Signed   By: Jearld Lesch M.D.   On: 02/13/2023 13:14       T.  [facility]  If 7PM-7AM, please contact night-coverage [email or web address] 02/14/2023, 12:22 PM

## 2023-02-14 NOTE — Progress Notes (Signed)
ANTICOAGULATION CONSULT NOTE Pharmacy Consult for IV heparin  Indication: atrial fibrillation  No Known Allergies  Patient Measurements: Height: 6\' 4"  (193 cm) Weight: 122.1 kg (269 lb 2.9 oz) IBW/kg (Calculated) : 86.8 Heparin Dosing Weight: 112 kg  Vital Signs: Temp: 98.2 F (36.8 C) (08/02 1924) Temp Source: Oral (08/02 1924) BP: 120/63 (08/02 1924) Pulse Rate: 82 (08/02 2000)  Labs: Recent Labs    02/12/23 1054 02/13/23 0402 02/14/23 0126 02/14/23 2014  HGB 12.8* 11.8* 12.3*  --   HCT 37.9* 35.4* 36.9*  --   PLT 149* 120* 146*  --   APTT 28  --   --   --   LABPROT 15.5*  --   --   --   INR 1.2  --   --   --   HEPARINUNFRC  --   --   --  0.15*  CREATININE 1.48* 1.15 1.36*  --     Estimated Creatinine Clearance: 77.3 mL/min (A) (by C-G formula based on SCr of 1.36 mg/dL (H)).   Medical History: Past Medical History:  Diagnosis Date   Chronic lymphocytic leukemia (CLL), B-cell (HCC)    Small Cell Lymphoma   Coronary artery disease, non-occlusive 02/2017   Cardiac cath in setting of MI: 50 and 55% bifurcation LAD-Diag1   Enlarged lymph node    left neck   Hypertension    STEMI (ST elevation myocardial infarction) (HCC) 03/05/2017   hx/notes 03/05/2017 -likely aborted anterior STEMI with 50% bifurcation LAD-Diag1.  No PCI.  Preserved EF   Syncope due to orthostatic hypotension 04/21/2018    Medications:  Infusions:   amiodarone 30 mg/hr (02/14/23 1854)   cefTRIAXone (ROCEPHIN)  IV Stopped (02/14/23 1200)   heparin 1,250 Units/hr (02/14/23 1346)   lactated ringers Stopped (02/13/23 1336)    Assessment: 67 yo male with new onset afib.  Pharmacy asked to start IV heparin per discussion with Dr. Jacques Navy, while awaiting clarification of plans for biopsy.  Received 1 dose of lovenox 120 mg last night (8/1 @ 2308).  Heparin level subtherapeutic at 0.15, no bleeding issues noted.  Goal of Therapy:  Heparin level 0.3-0.7 units/ml Monitor platelets by  anticoagulation protocol: Yes   Plan:  Heparin 2500 units x1 Increase heparin to 1500 units/h Recheck heparin level in 6h  Fredonia Highland, PharmD, [place], [facility] Clinical Pharmacist [phone] Please check AMION for all [facility] Pharmacy numbers 02/14/2023

## 2023-02-14 NOTE — Significant Event (Signed)
Rapid Response Event Note   Reason for Call : Afib with RVR>>Cardioversion Initial Focused Assessment:  I was notified by Dr. Loney Loh of pt in Afib with RVR 130s-140s receiving IVF bolus and Amiodarone. Cardizem drip was discontinued. Upon arrival, Mr. Augello was alert, oriented x4 and denied CP, SOB, dizzyness and palpitations. Skin is warm, pink and diaphoretic. BBS CTA. With IVF bolus infusing his BP was 85/56 and 76/63. Dr. Daphine Deutscher came to bedside and ordered in addition to Amiodarone a dose of Lovenox prior to cardioversion. Versed and Fentanyl were ordered and given prior to cardioversion. Pads were placed, sedation and analgesia given and when pt was sedated he was cardioverted with 200J x1 at 2325.  Pt converted to SR in the 80s. Wife was at bedside.   2340- Hr 80 SR, 85/49 (60), RR 20 with sats 95% on 2L [place].  Midnight- HR 81 SR, 98/54 (61), RR 20 with sats 96% on 2L [place].   Interventions:  -Amiodarone 150 mg bolus and infusion -Lovenox 120 mg -Versed 3mg  IV -Fentanyl 50 mcg IV -Cardioversion 200J x1   MD Notified: Dr. Daphine Deutscher at bedside.  Call Time: 2222 Arrival [id number] End Time: 0001  Rose Fillers, RN

## 2023-02-14 NOTE — Progress Notes (Signed)
ANTICOAGULATION CONSULT NOTE - Initial Consult  Pharmacy Consult for IV heparin  Indication: atrial fibrillation  No Known Allergies  Patient Measurements: Height: 6\' 4"  (193 cm) Weight: 122.1 kg (269 lb 2.9 oz) IBW/kg (Calculated) : 86.8 Heparin Dosing Weight: 112 kg  Vital Signs: Temp: 97.6 F (36.4 C) (08/02 0800) Temp Source: Oral (08/02 0800) BP: 97/52 (08/02 0800) Pulse Rate: 86 (08/02 0800)  Labs: Recent Labs    02/12/23 1054 02/13/23 0402 02/14/23 0126  HGB 12.8* 11.8* 12.3*  HCT 37.9* 35.4* 36.9*  PLT 149* 120* 146*  APTT 28  --   --   LABPROT 15.5*  --   --   INR 1.2  --   --   CREATININE 1.48* 1.15 1.36*    Estimated Creatinine Clearance: 77.3 mL/min (A) (by C-G formula based on SCr of 1.36 mg/dL (H)).   Medical History: Past Medical History:  Diagnosis Date   Chronic lymphocytic leukemia (CLL), B-cell (HCC)    Small Cell Lymphoma   Coronary artery disease, non-occlusive 02/2017   Cardiac cath in setting of MI: 50 and 55% bifurcation LAD-Diag1   Enlarged lymph node    left neck   Hypertension    STEMI (ST elevation myocardial infarction) (HCC) 03/05/2017   hx/notes 03/05/2017 -likely aborted anterior STEMI with 50% bifurcation LAD-Diag1.  No PCI.  Preserved EF   Syncope due to orthostatic hypotension 04/21/2018    Medications:  Infusions:   cefTRIAXone (ROCEPHIN)  IV 2 g (02/14/23 0840)   heparin     lactated ringers Stopped (02/13/23 1336)    Assessment: 67 yo male with new onset afib.  Pharmacy asked to start IV heparin per discussion with Dr. Jacques Navy, while awaiting clarification of plans for biopsy.  Received 1 dose of lovenox 120 mg last night (8/1 @ 2308).  CBC stable  Goal of Therapy:  Heparin level 0.3-0.7 units/ml Monitor platelets by anticoagulation protocol: Yes   Plan:  Start IV heparin without bolus at rate of 1250 units/hr Check heparin level 8 hrs after gtt starts Daily heparin level and CBC. F/u plans for  biopsy.  Reece Leader, Colon Flattery, BCCP Clinical Pharmacist  02/14/2023 12:10 PM   [facility] pharmacy phone numbers are listed on [email or web address]

## 2023-02-14 NOTE — Progress Notes (Signed)
Rounding Note    Patient Name: Adam Reyes Date of Encounter: 02/14/2023  [facility] Cardiologist: Bryan Lemma, MD   Subjective   Feeling well despite yesterdays events. Wife at bedside.  Inpatient Medications    Scheduled Meds:  atorvastatin  40 mg Oral QPM   fentaNYL       midodrine  5 mg Oral TID WC   Continuous Infusions:  cefTRIAXone (ROCEPHIN)  IV 2 g (02/14/23 0840)   lactated ringers Stopped (02/13/23 1336)   PRN Meds: acetaminophen **OR** acetaminophen, albuterol, bisacodyl, fentaNYL, fentaNYL (SUBLIMAZE) injection, guaiFENesin, HYDROcodone-acetaminophen, ondansetron **OR** ondansetron (ZOFRAN) IV, prochlorperazine   Vital Signs    Vitals:   02/14/23 0342 02/14/23 0400 02/14/23 0500 02/14/23 0800  BP: (!) 104/58 (!) 107/54 (!) 100/55 (!) 97/52  Pulse: 83 79 80 86  Resp: 18 19 15 18   Temp: 97.6 F (36.4 C)   97.6 F (36.4 C)  TempSrc: Oral   Oral  SpO2: 96% 95% 95% 93%  Weight:   122.1 kg   Height:        Intake/Output Summary (Last 24 hours) at 02/14/2023 0943 Last data filed at 02/14/2023 1324 Gross per 24 hour  Intake 637.37 ml  Output 485 ml  Net 152.37 ml      02/14/2023    5:00 AM 02/13/2023   12:55 PM 02/13/2023    4:15 AM  Last 3 Weights  Weight (lbs) 269 lb 2.9 oz 268 lb 269 lb 12.8 oz  Weight (kg) 122.1 kg 121.564 kg 122.38 kg      Telemetry    SR - Personally Reviewed  ECG    SR nonspecific st abnl - Personally Reviewed  Physical Exam   GEN: No acute distress.   Neck: No JVD Cardiac: loud s1 and s2. Diastolic murmur accentuates with exhalation.   Respiratory: Clear to auscultation bilaterally. GI: Soft, nontender, non-distended  MS: No edema; No deformity. Pulses 4/4 distally Neuro:  Nonfocal  Psych: Normal affect   Labs    High Sensitivity Troponin:  No results for input(s): "TROPONINIHS" in the last 720 hours.   Chemistry Recent Labs  Lab 02/12/23 0919 02/12/23 1054 02/13/23 0402 02/14/23 0126  NA  133* 131* 134* 136  K 3.6 3.5 3.0* 3.5  CL 96* 96* 98 103  CO2 22 22 23 22   GLUCOSE 118* 114* 91 101*  BUN 24* 25* 28* 30*  CREATININE 1.30* 1.48* 1.15 1.36*  CALCIUM 9.4 8.9 8.8* 8.6*  MG 1.9  --  2.0 2.0  PROT 6.6 5.8* 5.3* 4.9*  ALBUMIN 3.9 3.4* 3.1* 2.7*  AST 49* 45* 41 45*  ALT 49* 45* 44 45*  ALKPHOS 107 96 96 89  BILITOT 4.6* 3.7* 2.6* 2.6*  GFRNONAA >60 52* >60 58*  ANIONGAP 15 13 13 11     Lipids No results for input(s): "CHOL", "TRIG", "HDL", "LABVLDL", "LDLCALC", "CHOLHDL" in the last 168 hours.  Hematology Recent Labs  Lab 02/12/23 1054 02/13/23 0402 02/14/23 0126  WBC 7.5 7.0 8.3  RBC 3.91* 3.62* 3.86*  HGB 12.8* 11.8* 12.3*  HCT 37.9* 35.4* 36.9*  MCV 96.9 97.8 95.6  MCH 32.7 32.6 31.9  MCHC 33.8 33.3 33.3  RDW 14.3 14.0 14.4  PLT 149* 120* 146*   Thyroid No results for input(s): "TSH", "FREET4" in the last 168 hours.  BNPNo results for input(s): "BNP", "PROBNP" in the last 168 hours.  DDimer No results for input(s): "DDIMER" in the last 168 hours.   Radiology  CT ABDOMEN LIMITED WO CONTRAST  Result Date: 02/13/2023 CLINICAL DATA:  Retroperitoneal biopsy, procedure deferred due to onset of AFib * Tracking Code: BO * EXAM: CT ABDOMEN WITHOUT CONTRAST LIMITED TECHNIQUE: Multidetector CT imaging of the abdomen was performed following the standard protocol without IV contrast. RADIATION DOSE REDUCTION: This exam was performed according to the departmental dose-optimization program which includes automated exposure control, adjustment of the mA and/or kV according to patient size and/or use of iterative reconstruction technique. COMPARISON:  CT abdomen pelvis, 02/12/2023 FINDINGS: Hepatobiliary: No solid liver abnormality is seen. No gallstones, gallbladder wall thickening, or biliary dilatation. Pancreas: Unremarkable. No pancreatic ductal dilatation or surrounding inflammatory changes. Spleen: Gross splenomegaly, maximum span 20.3 cm. Adrenals/Urinary Tract:  Adrenal glands are unremarkable. Simple, benign left renal cortical cysts, for which no further follow-up or characterization is required kidneys are otherwise normal, without renal calculi, solid lesion, or hydronephrosis. Stomach/Bowel: Stomach is within normal limits. No evidence of bowel wall thickening, distention, or inflammatory changes. Descending colonic diverticulosis. Vascular/Lymphatic: Aortic atherosclerosis. Unchanged bulky retrocrural, retroperitoneal, and bilateral iliac lymphadenopathy, with numerous additional prominent lymph nodes throughout the bowel mesentery. Other: No abdominal wall hernia or abnormality. No ascites. Musculoskeletal: No acute or significant osseous findings. IMPRESSION: 1. Unchanged bulky retrocrural, retroperitoneal, and bilateral iliac lymphadenopathy, with numerous additional prominent lymph nodes throughout the bowel mesentery. 2. Gross splenomegaly, maximum span 20.3 cm. 3. Findings are unchanged in comparison to prior day's examination and most consistent with lymphoma. No acute findings. 4. Descending colonic diverticulosis without evidence of acute diverticulitis. Aortic Atherosclerosis (ICD10-I70.0). Electronically Signed   By: Jearld Lesch M.D.   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 Right lower quadrant abdominal pain. 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 * Tracking Code: BO * EXAM: CT ABDOMEN AND PELVIS WITH CONTRAST TECHNIQUE: Multidetector CT imaging of the abdomen and pelvis was performed using the standard protocol following bolus administration of intravenous contrast. RADIATION DOSE REDUCTION: This exam was performed according to the departmental dose-optimization program which includes automated exposure control, adjustment of the mA and/or kV according to patient size and/or use of iterative reconstruction technique.  CONTRAST:  OMNIPAQUE IOHEXOL 300 MG/ML  SOLN COMPARISON:  PET-CT 02/06/2023. CT of the chest, abdomen and pelvis 01/24/2023 and 04/11/2022. FINDINGS: Lower chest: Stable linear atelectasis or scarring at both lung bases. Small pulmonary nodules bilaterally are unchanged from recent prior imaging, measuring up to 8 mm in the right lower lobe and 9 mm in the left lower lobe, both on image 9/5. There is progressive subcarinal and distal paraesophageal adenopathy as seen on recent PET-CT. A distal right paraesophageal node measures up to 5.4 x 4.6 cm on image 14/2. Hepatobiliary: The liver is normal in density without suspicious focal abnormality. No evidence of gallstones, gallbladder wall thickening or biliary dilatation. Pancreas: Unremarkable. No pancreatic ductal dilatation or surrounding inflammatory changes. Spleen: Progressive splenomegaly compared with recent CT. No focal abnormality identified. Adrenals/Urinary Tract: Both adrenal glands appear normal. No evidence of urinary tract calculus, suspicious renal lesion or hydronephrosis. Unchanged cyst in the mid left kidney for which no follow-up imaging is recommended. Stable bladder compression by the bilateral pelvic lymphadenopathy. No intrinsic bladder abnormality identified. Stomach/Bowel: No enteric contrast administered. The stomach appears unremarkable for its degree of distension. No evidence of bowel wall thickening, distention or surrounding inflammatory change. Vascular/Lymphatic: Again demonstrated is extensive retroperitoneal, pelvic and bilateral inguinal adenopathy.  No significant change is seen from the recent PET-CT, although some of the lymph nodes have enlarged from the diagnostic CT done 01/24/2023. For example, there is a left external iliac node measuring 4.5 cm short axis on image 86/2 which previously measured 3.7 cm. A presacral node measuring 3.1 cm on image 72/2 previously measured 2.3 cm. No nodal necrosis identified. Mild  aortic and branch vessel atherosclerosis without evidence of aneurysm or large vessel occlusion. Reproductive: The prostate gland appears mildly enlarged, but stable. Other: No evidence of abdominal wall mass or hernia. No ascites or pneumoperitoneum. Musculoskeletal: No acute or significant osseous findings. There is multilevel spondylosis with a grade 1 anterolisthesis at L5-S1 secondary to underlying pars defects. Associated prominent foraminal narrowing in the lower lumbar spine, unchanged. Unless specific follow-up recommendations are mentioned in the findings or impression sections, no imaging follow-up of any mentioned incidental findings is recommended. IMPRESSION: 1. Known extensive adenopathy in the lower chest, abdomen and pelvis has mildly progressed compared with the CTs of less than 3 weeks earlier consistent with progressive lymphoma. 2. Progressive splenomegaly. 3. Stable small pulmonary nodules at both lung bases. 4. No definite acute findings. 5. 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 Associated prominent foraminal narrowing in the lower lumbar spine. 6.  Aortic Atherosclerosis (ICD10-I70.0). Electronically Signed   By: Carey Bullocks M.D.   On: 02/12/2023 12:16   DG Chest Port 1 View  Result Date: 02/12/2023 CLINICAL DATA:  Sepsis.  History of lymphoma EXAM: PORTABLE CHEST 1 VIEW COMPARISON:  PET-CT 02/06/2023 FINDINGS: Subtle bandlike opacity right lung base. Atelectasis is favored. No consolidation, pneumothorax or effusion. No edema. Normal cardiopericardial silhouette. Overlapping cardiac leads. IMPRESSION: Right basilar atelectasis.  No pneumothorax or effusion. Electronically Signed   By: Karen Kays M.D.   On: 02/12/2023 11:29    Cardiac Studies   Echo pending  Patient Profile     67 y.o. male with a history of STEMI with proximal LAD 50% and ostial D1 55% with recommendations to treat medically, frequent PVCs on  cardiac monitor, hypertension, hyperlipidemia, anemia lymphoma and history of GI bleed who presents to the hospital for coordination of biopsy of lymph node, subsequently noted to have decompensation with rapid atrial fibrillation necessitating urgent cardioversion.  Assessment & Plan    Principal Problem:   Hypotension Active Problems:   Chronic lymphocytic leukemia (CLL), B-cell (HCC)   Coronary artery disease involving native coronary artery of native heart with angina pectoris (HCC)   Dyslipidemia, goal LDL below 70   Lymphoma, small lymphocytic (HCC)   Frequent PVCs   Poor appetite   Low energy   Question of Richter's transformation   Elevated lactic acid level   Dehydration   Hyponatremia   Elevated liver enzymes   AKI (acute kidney injury) (HCC)   Hyperglycemia   Atrial fibrillation with RVR (HCC)   Hemodynamic instability  1.  A-fib with rapid ventricular response -New diagnosis, presented unstable with hypotension, required urgent cardioversion.  Has continued on amiodarone infusion and is on Lovenox 1 mg/kg.  This should be continued for 30 days if possible, however with need for possible stat biopsy per family, may be reasonable to consider holding 1 dose to facilitate biopsy if needed, we can discuss this with interventional radiology to facilitate appropriate timing.  With CHA2DS2-VASc score of at least 3 for age, hypertension, and coronary artery disease, would likely require long-term anticoagulation. -Echocardiogram is pending, patient also has a loud  S1 and S2 and a diastolic murmur, need to revisit his aortic valve regurgitation.   CHA2DS2-VASc Score = 3  The patient's score is based upon: CHF History: 0 HTN History: 1 Diabetes History: 0 Stroke History: 0 Vascular Disease History: 1 Age Score: 1 Gender Score: 0   2.  Coronary artery disease-prior presentation with report of STEMI but disease felt amenable to medical management, follows with Dr. Herbie Baltimore.   Lipids with good control as of November 2023, LDL 65.  Continues on atorvastatin 40 mg daily.  LFTs appear mildly elevated, likely in the setting of hypotension, follow.        For questions or updates, please contact [facility] Please consult [email or web address] for contact info under        Signed, Parke Poisson, MD  02/14/2023, 9:43 AM

## 2023-02-14 NOTE — Progress Notes (Signed)
  Echocardiogram 2D Echocardiogram has been performed.  Adam Reyes 02/14/2023, 10:51 AM

## 2023-02-14 NOTE — Progress Notes (Signed)
Mobility Specialist Progress Note   02/14/23 0930  Mobility  Activity Ambulated with assistance in hallway  Level of Assistance Modified independent, requires aide device or extra time  Assistive Device Other (Comment) (IV Pole)  Distance Ambulated (ft) 400 ft  Range of Motion/Exercises Active;All extremities  Activity Response Tolerated well   Pre Ambulation:  HR 87 +/-, BP 131/74,  SpO2 96% RA During Ambulation: HR 90-110 Post Ambulation: HR 90  Patient received in supine and agreeable to participate. Ambulated mod I with steady gait. Returned to room without complaint or incident. Was left in supine with all needs met, call bell in reach.   [place] , BS EXP Mobility Specialist Please contact via SecureChat or Rehab office at [phone]

## 2023-02-15 DIAGNOSIS — I9589 Other hypotension: Secondary | ICD-10-CM | POA: Diagnosis not present

## 2023-02-15 DIAGNOSIS — N179 Acute kidney failure, unspecified: Secondary | ICD-10-CM | POA: Diagnosis not present

## 2023-02-15 DIAGNOSIS — R748 Abnormal levels of other serum enzymes: Secondary | ICD-10-CM | POA: Diagnosis not present

## 2023-02-15 DIAGNOSIS — R5383 Other fatigue: Secondary | ICD-10-CM | POA: Diagnosis not present

## 2023-02-15 LAB — HEPARIN LEVEL (UNFRACTIONATED): Heparin Unfractionated: 0.29 IU/mL — ABNORMAL LOW (ref 0.30–0.70)

## 2023-02-15 MED ORDER — POTASSIUM CHLORIDE CRYS ER 20 MEQ PO TBCR
40.0000 meq | EXTENDED_RELEASE_TABLET | ORAL | Status: AC
  Administered 2023-02-15 (×2): 40 meq via ORAL
  Filled 2023-02-15 (×2): qty 2

## 2023-02-15 NOTE — Progress Notes (Addendum)
Rounding Note    Patient Name: Adam Reyes Date of Encounter: 02/15/2023  [facility] Cardiologist: Bryan Lemma, MD   Subjective   Feeling so so today.  UOP inaccurate due to no strict I/o. Patient subjectively urinating.   Inpatient Medications    Scheduled Meds:  atorvastatin  40 mg Oral QPM   potassium chloride  40 mEq Oral Q4H   Continuous Infusions:  amiodarone 30 mg/hr (02/15/23 0615)   cefTRIAXone (ROCEPHIN)  IV Stopped (02/14/23 1200)   heparin 1,500 Units/hr (02/15/23 1610)   lactated ringers Stopped (02/13/23 1336)   PRN Meds: acetaminophen **OR** acetaminophen, albuterol, bisacodyl, fentaNYL (SUBLIMAZE) injection, guaiFENesin, HYDROcodone-acetaminophen, ondansetron **OR** ondansetron (ZOFRAN) IV, prochlorperazine   Vital Signs    Vitals:   02/14/23 2000 02/14/23 2327 02/15/23 0323 02/15/23 0500  BP:  109/60 114/64   Pulse: 82 88 83 78  Resp: 20 16 18 18   Temp:  97.9 F (36.6 C) 97.8 F (36.6 C)   TempSrc:  Oral Oral   SpO2: 93% 94% 94% 93%  Weight:    122.8 kg  Height:        Intake/Output Summary (Last 24 hours) at 02/15/2023 0717 Last data filed at 02/15/2023 0400 Gross per 24 hour  Intake 3464.42 ml  Output --  Net 3464.42 ml      02/15/2023    5:00 AM 02/14/2023    5:00 AM 02/13/2023   12:55 PM  Last 3 Weights  Weight (lbs) 270 lb 11.6 oz 269 lb 2.9 oz 268 lb  Weight (kg) 122.8 kg 122.1 kg 121.564 kg      Telemetry    SR - Personally Reviewed  ECG    SR nonspecific st abnl - Personally Reviewed  Physical Exam   GEN: No acute distress.   Neck: No JVD Cardiac: loud s1 and s2. 2/6 systolic murmur Respiratory: Clear to auscultation bilaterally. GI: Soft, nontender, non-distended  MS: No edema; No deformity. Pulses 4/4 distally Neuro:  Nonfocal  Psych: Normal affect   Labs    High Sensitivity Troponin:  No results for input(s): "TROPONINIHS" in the last 720 hours.   Chemistry Recent Labs  Lab 02/13/23 0402  02/14/23 0126 02/15/23 0547  NA 134* 136 135  K 3.0* 3.5 3.3*  CL 98 103 103  CO2 23 22 22   GLUCOSE 91 101* 106*  BUN 28* 30* 29*  CREATININE 1.15 1.36* 1.20  CALCIUM 8.8* 8.6* 8.8*  MG 2.0 2.0 2.0  PROT 5.3* 4.9* 5.0*  ALBUMIN 3.1* 2.7* 2.8*  AST 41 45* 44*  ALT 44 45* 45*  ALKPHOS 96 89 107  BILITOT 2.6* 2.6* 2.5*  GFRNONAA >60 58* >60  ANIONGAP 13 11 10     Lipids No results for input(s): "CHOL", "TRIG", "HDL", "LABVLDL", "LDLCALC", "CHOLHDL" in the last 168 hours.  Hematology Recent Labs  Lab 02/13/23 0402 02/14/23 0126 02/15/23 0547  WBC 7.0 8.3 10.5  RBC 3.62* 3.86* 3.94*  HGB 11.8* 12.3* 12.6*  HCT 35.4* 36.9* 37.6*  MCV 97.8 95.6 95.4  MCH 32.6 31.9 32.0  MCHC 33.3 33.3 33.5  RDW 14.0 14.4 14.6  PLT 120* 146* 151   Thyroid  Recent Labs  Lab 02/14/23 0209  TSH 1.380    BNPNo results for input(s): "BNP", "PROBNP" in the last 168 hours.  DDimer No results for input(s): "DDIMER" in the last 168 hours.   Radiology    ECHOCARDIOGRAM COMPLETE  Result Date: 02/14/2023    ECHOCARDIOGRAM REPORT   Patient  Name:   SAMIK BALKCOM Date of Exam: 02/14/2023 Medical Rec #:  [id number]     Height:       76.0 in Accession #:    [id number]    Weight:       269.2 lb Date of Birth:  07/01/1956      BSA:          2.513 m Patient Age:    67 years      BP:           97/52 mmHg Patient Gender: M             HR:           85 bpm. Exam Location:  Inpatient Procedure: 2D Echo, Cardiac Doppler, Color Doppler and Intracardiac            Opacification Agent Indications:    Atrial fibrillation  History:        Patient has prior history of Echocardiogram examinations, most                 recent 07/24/2021. CAD and Previous Myocardial Infarction; Risk                 Factors:Hypertension. Lymphoma.  Sonographer:    Milda Smart Referring Phys: [id number] Boyce Medici GONFA  Sonographer Comments: Image acquisition challenging due to patient body habitus and Image acquisition challenging due to respiratory  motion. IMPRESSIONS  1. Left ventricular ejection fraction, by estimation, is 70 to 75%. The left ventricle has hyperdynamic function. The left ventricle has no regional wall motion abnormalities. There is mild asymmetric left ventricular hypertrophy of the basal-septal segment. Peak LV outflow tract gradient 94 mmHg. Left ventricular diastolic parameters are consistent with Grade II diastolic dysfunction (pseudonormalization).  2. Right ventricular systolic function is normal. The right ventricular size is normal. Tricuspid regurgitation signal is inadequate for assessing PA pressure.  3. Left atrial size was mildly dilated.  4. There is mitral valve systolic anterior motion. The mitral valve is abnormal. Trivial mitral valve regurgitation. No evidence of mitral stenosis.  5. The aortic valve is tricuspid. Aortic valve regurgitation is mild to moderate. No aortic stenosis is present.  6. Aortic dilatation noted. There is mild dilatation of the ascending aorta, measuring 40 mm.  7. The inferior vena cava is normal in size with greater than 50% respiratory variability, suggesting right atrial pressure of 3 mmHg.  8. Vigorous LV systolic function with LVOT gradient and SAM. Patient has HOCM physiology. FINDINGS  Left Ventricle: Left ventricular ejection fraction, by estimation, is 70 to 75%. The left ventricle has hyperdynamic function. The left ventricle has no regional wall motion abnormalities. Definity contrast agent was given IV to delineate the left ventricular endocardial borders. The left ventricular internal cavity size was normal in size. There is mild asymmetric left ventricular hypertrophy of the basal-septal segment. Left ventricular diastolic parameters are consistent with Grade II diastolic  dysfunction (pseudonormalization). Right Ventricle: The right ventricular size is normal. No increase in right ventricular wall thickness. Right ventricular systolic function is normal. Tricuspid regurgitation  signal is inadequate for assessing PA pressure. Left Atrium: Left atrial size was mildly dilated. Right Atrium: Right atrial size was normal in size. Pericardium: There is no evidence of pericardial effusion. Mitral Valve: There is mitral valve systolic anterior motion. The mitral valve is abnormal. Trivial mitral valve regurgitation. No evidence of mitral valve stenosis. MV peak gradient, 6.0 mmHg. The mean mitral valve gradient is 3.0 mmHg. Tricuspid  Valve: The tricuspid valve is normal in structure. Tricuspid valve regurgitation is trivial. Aortic Valve: The aortic valve is tricuspid. Aortic valve regurgitation is mild to moderate. Aortic regurgitation PHT measures 450 msec. No aortic stenosis is present. Aortic valve mean gradient measures 48.0 mmHg. Aortic valve peak gradient measures 93.7 mmHg. Pulmonic Valve: The pulmonic valve was normal in structure. Pulmonic valve regurgitation is trivial. Aorta: The aortic root is normal in size and structure and aortic dilatation noted. There is mild dilatation of the ascending aorta, measuring 40 mm. Venous: The inferior vena cava is normal in size with greater than 50% respiratory variability, suggesting right atrial pressure of 3 mmHg. IAS/Shunts: No atrial level shunt detected by color flow Doppler.  LEFT VENTRICLE PLAX 2D LVIDd:         5.50 cm      Diastology LVIDs:         3.10 cm      LV e' medial:    6.96 cm/s LV PW:         1.00 cm      LV E/e' medial:  10.6 LV IVS:        0.80 cm      LV e' lateral:   8.16 cm/s LVOT diam:     2.40 cm      LV E/e' lateral: 9.1 LVOT Area:     4.52 cm  LV Volumes (MOD) LV vol d, MOD A2C: 135.0 ml LV vol d, MOD A4C: 130.0 ml LV vol s, MOD A2C: 33.0 ml LV vol s, MOD A4C: 38.4 ml LV SV MOD A2C:     102.0 ml LV SV MOD A4C:     130.0 ml LV SV MOD BP:      97.9 ml RIGHT VENTRICLE RV Basal diam:  2.60 cm RV S prime:     21.10 cm/s TAPSE (M-mode): 2.5 cm LEFT ATRIUM             Index        RIGHT ATRIUM           Index LA diam:         3.60 cm 1.43 cm/m   RA Area:     14.20 cm LA Vol (A2C):   83.1 ml 33.06 ml/m  RA Volume:   31.00 ml  12.33 ml/m LA Vol (A4C):   84.0 ml 33.42 ml/m LA Biplane Vol: 90.8 ml 36.13 ml/m  AORTIC VALVE AV Vmax:      484.00 cm/s AV Vmean:     329.000 cm/s AV VTI:       0.756 m AV Peak Grad: 93.7 mmHg AV Mean Grad: 48.0 mmHg AI PHT:       450 msec  AORTA Ao Root diam: 3.60 cm Ao Asc diam:  4.00 cm MITRAL VALVE MV Peak grad: 6.0 mmHg     SHUNTS MV Mean grad: 3.0 mmHg     Systemic Diam: 2.40 cm MV Vmax:      1.22 m/s MV Vmean:     81.3 cm/s MR Peak grad: 96.0 mmHg MR Vmax:      490.00 cm/s MV E velocity: 73.90 cm/s MV A velocity: 89.90 cm/s MV E/A ratio:  0.82 Dalton McleanMD Electronically signed by Wilfred Lacy Signature Date/Time: 02/14/2023/11:17:54 AM    Final    CT ABDOMEN LIMITED WO CONTRAST  Result Date: 02/13/2023 CLINICAL DATA:  Retroperitoneal biopsy, procedure deferred due to onset of AFib * Tracking Code: BO * EXAM: CT ABDOMEN WITHOUT CONTRAST  LIMITED TECHNIQUE: Multidetector CT imaging of the abdomen was performed following the standard protocol without IV contrast. RADIATION DOSE REDUCTION: This exam was performed according to the departmental dose-optimization program which includes automated exposure control, adjustment of the mA and/or kV according to patient size and/or use of iterative reconstruction technique. COMPARISON:  CT abdomen pelvis, 02/12/2023 FINDINGS: Hepatobiliary: No solid liver abnormality is seen. No gallstones, gallbladder wall thickening, or biliary dilatation. Pancreas: Unremarkable. No pancreatic ductal dilatation or surrounding inflammatory changes. Spleen: Gross splenomegaly, maximum span 20.3 cm. Adrenals/Urinary Tract: Adrenal glands are unremarkable. Simple, benign left renal cortical cysts, for which no further follow-up or characterization is required kidneys are otherwise normal, without renal calculi, solid lesion, or hydronephrosis. Stomach/Bowel: Stomach is within  normal limits. No evidence of bowel wall thickening, distention, or inflammatory changes. Descending colonic diverticulosis. Vascular/Lymphatic: Aortic atherosclerosis. Unchanged bulky retrocrural, retroperitoneal, and bilateral iliac lymphadenopathy, with numerous additional prominent lymph nodes throughout the bowel mesentery. Other: No abdominal wall hernia or abnormality. No ascites. Musculoskeletal: No acute or significant osseous findings. IMPRESSION: 1. Unchanged bulky retrocrural, retroperitoneal, and bilateral iliac lymphadenopathy, with numerous additional prominent lymph nodes throughout the bowel mesentery. 2. Gross splenomegaly, maximum span 20.3 cm. 3. Findings are unchanged in comparison to prior day's examination and most consistent with lymphoma. No acute findings. 4. Descending colonic diverticulosis without evidence of acute diverticulitis. Aortic Atherosclerosis (ICD10-I70.0). Electronically Signed   By: Jearld Lesch M.D.   On: 02/13/2023 13:14    Cardiac Studies   Echo pending  Patient Profile     67 y.o. male with a history of STEMI with proximal LAD 50% and ostial D1 55% with recommendations to treat medically, frequent PVCs on cardiac monitor, hypertension, hyperlipidemia, anemia lymphoma and history of GI bleed who presents to the hospital for coordination of biopsy of lymph node, subsequently noted to have decompensation with rapid atrial fibrillation necessitating urgent cardioversion.  Assessment & Plan    Principal Problem:   Hypotension Active Problems:   Chronic lymphocytic leukemia (CLL), B-cell (HCC)   Coronary artery disease involving native coronary artery of native heart with angina pectoris (HCC)   Dyslipidemia, goal LDL below 70   Lymphoma, small lymphocytic (HCC)   Frequent PVCs   Poor appetite   Low energy   Question of Richter's transformation   Elevated lactic acid level   Dehydration   Hyponatremia   Elevated liver enzymes   AKI (acute kidney  injury) (HCC)   Hyperglycemia   Atrial fibrillation with RVR (HCC)   Hemodynamic instability  1.  A-fib with rapid ventricular response -New diagnosis, presented unstable with hypotension, required urgent cardioversion.  Has continued on amiodarone infusion and has been transition to heparin infusion, needs going anticoagulation uninterrupted for 30 days at a minimum due to DCCV.  -Echocardiogram with hyperdynamic function and LVOT obstruction. Recommend hydration. BP better today and murmur less pronounced.   CHA2DS2-VASc Score = 3  The patient's score is based upon: CHF History: 0 HTN History: 1 Diabetes History: 0 Stroke History: 0 Vascular Disease History: 1 Age Score: 1 Gender Score: 0   2.  Coronary artery disease-prior presentation with report of STEMI but disease felt amenable to medical management, follows with Dr. Herbie Baltimore.  Lipids with good control as of November 2023, LDL 65.  Continues on atorvastatin 40 mg daily.  LFTs appear mildly elevated, likely in the setting of hypotension, follow.  3. Hyperdynamic LV function with LVOT obstruction, likely secondary to overall illness, no definite HCM. Will reassess when  patient has been volume loaded and optimized. Can repeat echo limited before dismissal or in 1-2 weeks pending clinical course.   Patient eagerly awaiting biopsy. No further cardiac testing or interventions required prior to proceeding. We are trying to optimize his LVOT gradient with volume resuscitation but he has been maintaining  SR and normotension.  For questions or updates, please contact [facility] Please consult [email or web address] for contact info under        Signed, Parke Poisson, MD  02/15/2023, 7:17 AM

## 2023-02-15 NOTE — Progress Notes (Signed)
ANTICOAGULATION CONSULT NOTE Pharmacy Consult for IV heparin  Indication: atrial fibrillation  No Known Allergies  Patient Measurements: Height: 6\' 4"  (193 cm) Weight: 122.8 kg (270 lb 11.6 oz) IBW/kg (Calculated) : 86.8 Heparin Dosing Weight: 112 kg  Vital Signs: Temp: 97.8 F (36.6 C) (08/03 0323) Temp Source: Oral (08/03 0323) BP: 114/64 (08/03 0323) Pulse Rate: 78 (08/03 0500)  Labs: Recent Labs    02/12/23 1054 02/13/23 0402 02/14/23 0126 02/14/23 2014 02/15/23 0547  HGB 12.8* 11.8* 12.3*  --  12.6*  HCT 37.9* 35.4* 36.9*  --  37.6*  PLT 149* 120* 146*  --  151  APTT 28  --   --   --   --   LABPROT 15.5*  --   --   --   --   INR 1.2  --   --   --   --   HEPARINUNFRC  --   --   --  0.15* 0.27*  CREATININE 1.48* 1.15 1.36*  --  1.20    Estimated Creatinine Clearance: 87.8 mL/min (by C-G formula based on SCr of 1.2 mg/dL).   Medical History: Past Medical History:  Diagnosis Date   Chronic lymphocytic leukemia (CLL), B-cell (HCC)    Small Cell Lymphoma   Coronary artery disease, non-occlusive 02/2017   Cardiac cath in setting of MI: 50 and 55% bifurcation LAD-Diag1   Enlarged lymph node    left neck   Hypertension    STEMI (ST elevation myocardial infarction) (HCC) 03/05/2017   hx/notes 03/05/2017 -likely aborted anterior STEMI with 50% bifurcation LAD-Diag1.  No PCI.  Preserved EF   Syncope due to orthostatic hypotension 04/21/2018    Medications:  Infusions:   amiodarone 30 mg/hr (02/15/23 0615)   cefTRIAXone (ROCEPHIN)  IV Stopped (02/14/23 1200)   heparin 1,500 Units/hr (02/15/23 4098)   lactated ringers Stopped (02/13/23 1336)    Assessment: 67 yo male with new onset afib.  Pharmacy asked to start IV heparin per discussion with Dr. Jacques Navy, while awaiting clarification of plans for biopsy.  Received 1 dose of lovenox 120 mg (8/1 @ 2308).  Heparin level slightly subtherapeutic at 0.27, no bleeding issues noted.  Goal of Therapy:  Heparin level  0.3-0.7 units/ml Monitor platelets by anticoagulation protocol: Yes   Plan:  Increase IV heparin to 1600 units/hr. Repeat heparin level in 8 hrs Daily heparin level and CBC.  Reece Leader, Colon Flattery, BCCP Clinical Pharmacist  02/15/2023 7:48 AM   [facility] pharmacy phone numbers are listed on [email or web address]

## 2023-02-15 NOTE — Progress Notes (Signed)
ANTICOAGULATION CONSULT NOTE Pharmacy Consult for IV heparin  Indication: atrial fibrillation  No Known Allergies  Patient Measurements: Height: 6\' 4"  (193 cm) Weight: 122.8 kg (270 lb 11.6 oz) IBW/kg (Calculated) : 86.8 Heparin Dosing Weight: 112 kg  Vital Signs: Temp: 98.4 F (36.9 C) (08/03 1155) Temp Source: Oral (08/03 1155) BP: 124/67 (08/03 1155) Pulse Rate: 86 (08/03 1155)  Labs: Recent Labs    02/13/23 0402 02/14/23 0126 02/14/23 2014 02/15/23 0547 02/15/23 1552  HGB 11.8* 12.3*  --  12.6*  --   HCT 35.4* 36.9*  --  37.6*  --   PLT 120* 146*  --  151  --   HEPARINUNFRC  --   --  0.15* 0.27* 0.29*  CREATININE 1.15 1.36*  --  1.20  --     Estimated Creatinine Clearance: 87.8 mL/min (by C-G formula based on SCr of 1.2 mg/dL).   Medical History: Past Medical History:  Diagnosis Date   Chronic lymphocytic leukemia (CLL), B-cell (HCC)    Small Cell Lymphoma   Coronary artery disease, non-occlusive 02/2017   Cardiac cath in setting of MI: 50 and 55% bifurcation LAD-Diag1   Enlarged lymph node    left neck   Hypertension    STEMI (ST elevation myocardial infarction) (HCC) 03/05/2017   hx/notes 03/05/2017 -likely aborted anterior STEMI with 50% bifurcation LAD-Diag1.  No PCI.  Preserved EF   Syncope due to orthostatic hypotension 04/21/2018    Medications:  Infusions:   amiodarone 30 mg/hr (02/15/23 0615)   cefTRIAXone (ROCEPHIN)  IV 2 g (02/15/23 0958)   heparin 1,600 Units/hr (02/15/23 0752)   lactated ringers 75 mL/hr at 02/15/23 1612    Assessment: 67 yo male with new onset afib.  Pharmacy asked to start IV heparin per discussion with Dr. Jacques Navy, while awaiting clarification of plans for biopsy.  Received 1 dose of lovenox 120 mg (8/1 @ 2308).  Heparin level 0.29 is slightly subtherapeutic with heparin running at 1600 units/hr. Hgb (12.6) and PLTs (151) stable.  Goal of Therapy:  Heparin level 0.3-0.7 units/ml Monitor platelets by anticoagulation  protocol: Yes   Plan:  Increase IV heparin to 1700 units/hr. Repeat heparin level in 6 hrs Daily heparin level and CBC. Monitor for signs/sx of bleeding  Romie Minus, PharmD PGY1 Pharmacy Resident  Please check AMION for all [facility] Pharmacy phone numbers After 10:00 PM, call Main Pharmacy [phone]

## 2023-02-15 NOTE — Plan of Care (Signed)

## 2023-02-15 NOTE — Progress Notes (Signed)
PROGRESS NOTE  Adam Reyes HQI:[id number] DOB: [birth year]   PCP: Anabel Halon, MD  Patient is from: Home.  Lives with his wife.  Independently ambulates at baseline.  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  Reportedly had poor p.o. intake, fatigue, diaphoresis, dizziness and myalgia before he went to cancer center.  There was concern about Richter's transformation of his malignancy.  His oncologist, Dr. Ellin Saba recommended transfer to [facility] for bone biopsy.   Hospital course complicated by new onset A-fib with RVR for which she was initially started on Cardizem drip but became hypotensive.  Cardiology consulted.  He was switched to IV amiodarone and underwent DCCV the night of 8/1-2 and converted to sinus rhythm.  On amiodarone and heparin.  Patient improved clinically.  Blood cultures NGTD.  Urine culture with multiple species.  Empirically started on IV ceftriaxone for possible UTI.   Bone biopsy pending.  Subjective: Seen and examined earlier this morning.  No major events overnight of this morning.  No complaints other than some pain in his left groin and left thigh from lymphoma.  This is chronic.  Denies chest pain, dyspnea, GI or UTI symptoms.  Reports making good amount of urine.  Objective: Vitals:   02/15/23 0500 02/15/23 0745 02/15/23 1000 02/15/23 1155  BP:  123/62 129/68 124/67  Pulse: 78 81 76 86  Resp: 18 16 19 18   Temp:  98.2 F (36.8 C)  98.4 F (36.9 C)  TempSrc:  Oral  Oral   SpO2: 93% 93% 93% 93%  Weight: 122.8 kg     Height:        Examination:  GENERAL: No apparent distress.  Nontoxic. HEENT: MMM.  Vision and hearing grossly intact.  NECK: Supple.  No apparent JVD.  RESP:  No IWOB.  Fair aeration bilaterally. CVS:  RRR. Heart sounds normal.  ABD/GI/GU: BS+. Abd soft, NTND.  MSK/EXT:  Moves extremities. No apparent deformity. No edema.  SKIN: no apparent skin lesion or wound NEURO: Awake, alert and oriented appropriately.  No apparent focal neuro deficit. PSYCH: Calm. Normal affect.   Procedures:  8/1-DCCV for A-fib with RVR.  Microbiology summarized: MRSA PCR screen negative Blood cultures NGTD Urine culture reintubated for better growth.  Assessment and plan: Principal Problem:   Hypotension Active Problems:   Lymphoma, small lymphocytic (HCC)   Question of Richter's transformation   AKI (acute kidney injury) (HCC)   Chronic lymphocytic leukemia (CLL), B-cell (HCC)   Coronary artery disease involving native coronary artery of native heart with angina pectoris (HCC)   Dyslipidemia, goal LDL below 70   Frequent PVCs   Poor appetite   Low energy   Elevated lactic acid level   Dehydration   Hyponatremia   Elevated liver enzymes   Hyperglycemia   Atrial fibrillation with RVR (HCC)   Hemodynamic instability  Acute Kidney Injury: Suspect prerenal in the setting of poor p.o. intake and hypotension.  Also on Micardis at home. Recent Labs    04/11/22 1423 05/29/22 1324 09/16/22 1510 01/23/23 1011 02/12/23 0919 02/12/23 1054 02/13/23 0402 02/14/23 0126 02/15/23 4010  BUN 19 19 21  29* 24* 25* 28* 30* 29*  CREATININE 1.03 1.09 1.13 1.17 1.30* 1.48* 1.15 1.36* 1.20  -Decrease IV fluids to 75 cc an hour. -Continue monitoring -Avoid nephrotoxic meds   Hypotension: Likely due to dehydration and concurrent use of antihypertensive meds.  TTE without significant finding.  TSH and cortisol within normal.  Resolved. -Continue holding  antihypertensive meds -Discontinue midodrine -Decrease IV fluid  Generalized weakness: Likely due to the above.  Improved. -Treat treatable causes  Possible urinary tract infection: Some dysuria that he attributes to lying in bed.  No fever or chills.  UA with nitrites.  Urine culture with multiple species.  Immunocompromised patient -Continue empiric IV ceftriaxone at least for 3 days.  New onset A-fib with RVR: Initially started on Cardizem drip but became hypotensive.  Successful DCCV the evening of 8/1.  CHA2DS2-VASc score at least 3.  TTE without significant finding.  TSH within normal. -Appreciate cardiology input-recommends uninterrupted anticoagulation at least for 30 days after DCCV -On amiodarone and IV heparin -Optimize electrolytes   CLL with small lymphocytic lymphoma: Followed by Dr. Ellin Saba.  Was out of ibrutinib for 4 days prior to presentation.  Concern about Richter's transformation. Transferred to [facility] for bone biopsy. -CT bone biopsy by IR   Dehydration: Improved. -Decrease IV fluid.   Hypokalemia -Monitor replenish as appropriate  Lactic Acidosis: Likely due to hypotension and dehydration. -Hydration   CAD/dyslipidemia: No anginal symptoms -Continue Lipitor and aspirin. -Watch LFT while on Lipitor.   Hyponatremia: Resolved.   Elevated liver Enzymes/mild hyperbilirubinemia: Mild.  Possibly from ibrutinib.  No significant hepatobiliary finding on CT. HIV and acute hepatitis panel negative. -Monitor. -Consider holding statin and checking RUQ [place] if worse.  Normocytic anemia/thrombocytopenia/splenomegaly: Likely from malignancy.  Stable. -Monitor  Obesity Body mass index is 32.95 kg/m.           DVT prophylaxis:  SCDs Start: 02/12/23 1503 On full dose anticoagulation  Code Status: Full code Family Communication: Updated patient's wife at bedside Level of care: Telemetry Medical Status is: Inpatient Remains inpatient appropriate  because:  new onset A-fib and need for CT bone biopsy   Final disposition: Home Consultants:  Oncology Cardiology Interventional radiology  55 minutes with more than 50% spent in reviewing records, counseling patient/family and coordinating care.   Sch Meds:  Scheduled Meds:  atorvastatin  40 mg Oral QPM   Continuous Infusions:  amiodarone 30 mg/hr (02/15/23 0615)   cefTRIAXone (ROCEPHIN)  IV 2 g (02/15/23 0958)   heparin 1,600 Units/hr (02/15/23 0752)   lactated ringers 75 mL/hr at 02/15/23 1023   PRN Meds:.acetaminophen **OR** acetaminophen, albuterol, bisacodyl, fentaNYL (SUBLIMAZE) injection, guaiFENesin, HYDROcodone-acetaminophen, ondansetron **OR** ondansetron (ZOFRAN) IV, prochlorperazine  Antimicrobials: Anti-infectives (From admission, onward)    Start     Dose/Rate Route Frequency Ordered Stop   02/14/23 0800  cefTRIAXone (ROCEPHIN) 2 g in sodium chloride 0.9 % 100 mL IVPB        2 g 200 mL/hr over 30 Minutes Intravenous Every 24 hours 02/14/23 0718          I have personally reviewed the following labs and images: CBC: Recent Labs  Lab 02/12/23 0919 02/12/23 1054 02/13/23 0402 02/14/23 0126 02/15/23 0547  WBC 7.6 7.5 7.0 8.3 10.5  NEUTROABS 5.1 5.7  --   --   --   HGB 14.0 12.8* 11.8* 12.3* 12.6*  HCT 41.9 37.9* 35.4* 36.9* 37.6*  MCV 97.4 96.9 97.8 95.6 95.4  PLT 157 149* 120*  146* 151   BMP &GFR Recent Labs  Lab 02/12/23 0919 02/12/23 1054 02/13/23 0402 02/14/23 0126 02/15/23 0547  NA 133* 131* 134* 136 135  K 3.6 3.5 3.0* 3.5 3.3*  CL 96* 96* 98 103 103  CO2 22 22 23 22 22   GLUCOSE 118* 114* 91 101* 106*  BUN 24* 25* 28* 30* 29*  CREATININE 1.30* 1.48* 1.15 1.36* 1.20  CALCIUM 9.4 8.9 8.8* 8.6* 8.8*  MG 1.9  --  2.0 2.0 2.0  PHOS  --   --   --   --  2.7   Estimated Creatinine Clearance: 87.8 mL/min (by C-G formula based on SCr of 1.2 mg/dL). Liver & Pancreas: Recent Labs  Lab 02/12/23 0919 02/12/23 1054 02/13/23 0402  02/14/23 0126 02/15/23 0547  AST 49* 45* 41 45* 44*  ALT 49* 45* 44 45* 45*  ALKPHOS 107 96 96 89 107  BILITOT 4.6* 3.7* 2.6* 2.6* 2.5*  PROT 6.6 5.8* 5.3* 4.9* 5.0*  ALBUMIN 3.9 3.4* 3.1* 2.7* 2.8*   No results for input(s): "LIPASE", "AMYLASE" in the last 168 hours. No results for input(s): "AMMONIA" in the last 168 hours. Diabetic: No results for input(s): "HGBA1C" in the last 72 hours. Recent Labs  Lab 02/13/23 2226  GLUCAP 94   Cardiac Enzymes: No results for input(s): "CKTOTAL", "CKMB", "CKMBINDEX", "TROPONINI" in the last 168 hours. No results for input(s): "PROBNP" in the last 8760 hours. Coagulation Profile: Recent Labs  Lab 02/12/23 1054  INR 1.2   Thyroid Function Tests: Recent Labs    02/14/23 0209  TSH 1.380   Lipid Profile: No results for input(s): "CHOL", "HDL", "LDLCALC", "TRIG", "CHOLHDL", "LDLDIRECT" in the last 72 hours. Anemia Panel: No results for input(s): "VITAMINB12", "FOLATE", "FERRITIN", "TIBC", "IRON", "RETICCTPCT" in the last 72 hours. Urine analysis:    Component Value Date/Time   COLORURINE AMBER (A) 02/12/2023 2023   APPEARANCEUR HAZY (A) 02/12/2023 2023   LABSPEC 1.044 (H) 02/12/2023 2023   PHURINE 6.0 02/12/2023 2023   GLUCOSEU NEGATIVE 02/12/2023 2023   HGBUR MODERATE (A) 02/12/2023 2023   BILIRUBINUR SMALL (A) 02/12/2023 2023   KETONESUR NEGATIVE 02/12/2023 2023   PROTEINUR 100 (A) 02/12/2023 2023   NITRITE POSITIVE (A) 02/12/2023 2023   LEUKOCYTESUR NEGATIVE 02/12/2023 2023   Sepsis Labs: Invalid input(s): "PROCALCITONIN", "LACTICIDVEN"  Microbiology: Recent Results (from the past 240 hour(s))  Blood Culture (routine x 2)     Status: None (Preliminary result)   Collection Time: 02/12/23 10:54 AM   Specimen: BLOOD  Result Value Ref Range Status   Specimen Description BLOOD BLOOD RIGHT HAND  Final   Special Requests   Final    BOTTLES DRAWN AEROBIC AND ANAEROBIC Blood Culture results may not be optimal due to an  excessive volume of blood received in culture bottles   Culture   Final    NO GROWTH 3 DAYS Performed at [facility], [address], [place], [place] [address]    Report Status PENDING  Incomplete  Blood Culture (routine x 2)     Status: None (Preliminary result)   Collection Time: 02/12/23 10:54 AM   Specimen: BLOOD  Result Value Ref Range Status   Specimen Description BLOOD BLOOD LEFT ARM  Final   Special Requests   Final    BOTTLES DRAWN AEROBIC AND ANAEROBIC Blood Culture adequate volume   Culture   Final    NO GROWTH 3 DAYS Performed at [facility], [address], [place], [place] [address]  Report Status PENDING  Incomplete  Urine Culture     Status: Abnormal   Collection Time: 02/12/23  8:23 PM   Specimen: Urine, Random  Result Value Ref Range Status   Specimen Description   Final    URINE, RANDOM Performed at [facility], [address], [place], [place] [address]    Special Requests   Final    NONE Reflexed from [id number] Performed at [facility], [address] [place], [place] [address]    Culture MULTIPLE SPECIES PRESENT, SUGGEST RECOLLECTION (A)  Final   Report Status 02/14/2023 FINAL  Final  MRSA Next Gen by PCR, Nasal     Status: None   Collection Time: 02/13/23  4:40 PM   Specimen: Nasal Mucosa; Nasal Swab  Result Value Ref Range Status   MRSA by PCR Next Gen NOT DETECTED NOT DETECTED Final    Comment: (NOTE) The GeneXpert MRSA Assay (FDA approved for NASAL specimens only), is one component of a comprehensive MRSA colonization surveillance program. It is not intended to diagnose MRSA infection nor to guide or monitor treatment for MRSA infections. Test performance is not FDA approved in patients less than 10 years old. Performed at [facility], [address], [place], [place] [address]     Radiology Studies: No results found.     T.  [facility]  If 7PM-7AM, please contact night-coverage [email or web address] 02/15/2023, 1:57 PM

## 2023-02-15 NOTE — Plan of Care (Signed)

## 2023-02-16 DIAGNOSIS — R748 Abnormal levels of other serum enzymes: Secondary | ICD-10-CM | POA: Diagnosis not present

## 2023-02-16 DIAGNOSIS — R5383 Other fatigue: Secondary | ICD-10-CM | POA: Diagnosis not present

## 2023-02-16 DIAGNOSIS — N179 Acute kidney failure, unspecified: Secondary | ICD-10-CM | POA: Diagnosis not present

## 2023-02-16 DIAGNOSIS — I9589 Other hypotension: Secondary | ICD-10-CM | POA: Diagnosis not present

## 2023-02-16 LAB — RENAL FUNCTION PANEL
Albumin: 2.8 g/dL — ABNORMAL LOW (ref 3.5–5.0)
Anion gap: 12 (ref 5–15)
BUN: 23 mg/dL (ref 8–23)
CO2: 21 mmol/L — ABNORMAL LOW (ref 22–32)
Calcium: 8.8 mg/dL — ABNORMAL LOW (ref 8.9–10.3)
Chloride: 103 mmol/L (ref 98–111)
Creatinine, Ser: 1.14 mg/dL (ref 0.61–1.24)
GFR, Estimated: >60 mL/min (ref >60)
Glucose, Bld: 99 mg/dL (ref 70–99)
Phosphorus: 2.8 mg/dL (ref 2.5–4.6)
Potassium: 3.9 mmol/L (ref 3.5–5.1)
Sodium: 136 mmol/L (ref 135–145)

## 2023-02-16 LAB — CBC
HCT: 37.9 % — ABNORMAL LOW (ref 39.0–52.0)
Hemoglobin: 12.6 g/dL — ABNORMAL LOW (ref 13.0–17.0)
MCH: 32 pg (ref 26.0–34.0)
MCHC: 33.2 g/dL (ref 30.0–36.0)
MCV: 96.2 fL (ref 80.0–100.0)
Platelets: 139 10*3/uL — ABNORMAL LOW (ref 150–400)
RBC: 3.94 MIL/uL — ABNORMAL LOW (ref 4.22–5.81)
RDW: 14.8 % (ref 11.5–15.5)
WBC: 13.9 10*3/uL — ABNORMAL HIGH (ref 4.0–10.5)
nRBC: 1.1 % — ABNORMAL HIGH (ref 0.0–0.2)

## 2023-02-16 LAB — HEPARIN LEVEL (UNFRACTIONATED)
Heparin Unfractionated: 0.27 [IU]/mL — ABNORMAL LOW (ref 0.30–0.70)
Heparin Unfractionated: 0.28 IU/mL — ABNORMAL LOW (ref 0.30–0.70)
Heparin Unfractionated: 0.32 IU/mL (ref 0.30–0.70)

## 2023-02-16 LAB — MAGNESIUM: Magnesium: 2 mg/dL (ref 1.7–2.4)

## 2023-02-16 MED ORDER — OXYCODONE HCL 5 MG PO TABS
5.0000 mg | ORAL_TABLET | Freq: Three times a day (TID) | ORAL | Status: DC | PRN
  Administered 2023-02-16 – 2023-02-18 (×4): 5 mg via ORAL
  Filled 2023-02-16 (×4): qty 1

## 2023-02-16 MED ORDER — HYDROMORPHONE HCL 1 MG/ML IJ SOLN
0.5000 mg | INTRAMUSCULAR | Status: DC | PRN
  Administered 2023-02-16 – 2023-02-17 (×2): 0.5 mg via INTRAVENOUS
  Filled 2023-02-16 (×3): qty 0.5

## 2023-02-16 NOTE — Progress Notes (Addendum)
ANTICOAGULATION CONSULT NOTE Pharmacy Consult for IV heparin  Indication: atrial fibrillation  No Known Allergies  Patient Measurements: Height: 6\' 4"  (193 cm) Weight: 124.8 kg (275 lb 2.2 oz) IBW/kg (Calculated) : 86.8 Heparin Dosing Weight: 112 kg  Vital Signs: Temp: 97.6 F (36.4 C) (08/04 0808) Temp Source: Oral (08/04 0808) BP: 129/69 (08/04 0808) Pulse Rate: 82 (08/04 0333)  Labs: Recent Labs    02/14/23 0126 02/14/23 2014 02/15/23 0547 02/15/23 1552 02/16/23 0201 02/16/23 0908  HGB 12.3*  --  12.6*  --  12.6*  --   HCT 36.9*  --  37.6*  --  37.9*  --   PLT 146*  --  151  --  139*  --   HEPARINUNFRC  --    < > 0.27* 0.29* 0.27* 0.28*  CREATININE 1.36*  --  1.20  --  1.14  --    < > = values in this interval not displayed.    Estimated Creatinine Clearance: 93.2 mL/min (by C-G formula based on SCr of 1.14 mg/dL).   Medical History: Past Medical History:  Diagnosis Date   Chronic lymphocytic leukemia (CLL), B-cell (HCC)    Small Cell Lymphoma   Coronary artery disease, non-occlusive 02/2017   Cardiac cath in setting of MI: 50 and 55% bifurcation LAD-Diag1   Enlarged lymph node    left neck   Hypertension    STEMI (ST elevation myocardial infarction) (HCC) 03/05/2017   hx/notes 03/05/2017 -likely aborted anterior STEMI with 50% bifurcation LAD-Diag1.  No PCI.  Preserved EF   Syncope due to orthostatic hypotension 04/21/2018    Medications:  Infusions:   amiodarone 30 mg/hr (02/16/23 0927)   cefTRIAXone (ROCEPHIN)  IV 2 g (02/16/23 0921)   heparin 1,900 Units/hr (02/16/23 0340)    Assessment: 67 yo male with new onset afib.  Pharmacy asked to start IV heparin per discussion with Dr. Jacques Navy, while awaiting clarification of plans for biopsy.  Received 1 dose of lovenox 120 mg (8/1 @ 2308).  Heparin level is slightly subtherapeutic at 0.28, on heparin infusion at 1900 units/hr. Hgb 12.6, plt 139. No s/sx of bleeding but concern with IV line not working  properly so changed to alternative IV site this morning.   Goal of Therapy:  Heparin level 0.3-0.7 units/ml Monitor platelets by anticoagulation protocol: Yes   Plan:  Will continue IV heparin at 1900 units/hr. Repeat heparin level in 6 hrs at new IV site before rate increase  Daily heparin level and CBC. Monitor for signs/sx of bleeding  Thank you for allowing pharmacy to participate in this patient's care,  Sherron Monday, PharmD, BCCCP Clinical Pharmacist  Phone: [phone] 02/16/2023 10:14 AM  Please check AMION for all [facility] phone numbers After 10:00 PM, call Main Pharmacy [phone]  ADDENDUM Heparin level came back therapeutic at 0.32, on heparin infusion at 1900 units after changed to alternative IV site. No s/sx of bleeding or infusion issues. Given previous levels have been close to goal range will continue same rate and get level with AM labs.   Thank you for allowing pharmacy to participate in this patient's care,  Sherron Monday, PharmD, BCCCP Clinical Pharmacist  Phone: [phone] 02/16/2023 2:45 PM  Please check AMION for all [facility] phone numbers After 10:00 PM, call Main Pharmacy [phone]

## 2023-02-16 NOTE — Progress Notes (Signed)
PROGRESS NOTE  Adam Reyes GLO:[id number] DOB: [birth year]   PCP: Anabel Halon, MD  Patient is from: Home.  Lives with his wife.  Independently ambulates at baseline.  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  Reportedly had poor p.o. intake, fatigue, diaphoresis, dizziness and myalgia before he went to cancer center.  There was concern about Richter's transformation of his malignancy.  His oncologist, Dr. Ellin Saba recommended transfer to [facility] for bone biopsy.   Hospital course complicated by new onset A-fib with RVR for which she was initially started on Cardizem drip but became hypotensive.  Cardiology consulted.  He was switched to IV amiodarone and underwent DCCV the night of 8/1-2 and converted to sinus rhythm.  On amiodarone and heparin.  TTE with hyperdynamic LV function with LVOT obstruction but no HCM.  Cardiology recommends repeat echo in 1 to 2 weeks  Patient improved clinically.  Blood cultures NGTD.  Urine culture with multiple species.  Empirically started on IV ceftriaxone for possible UTI.    Bone biopsy pending.  Subjective: Seen and examined earlier this morning.  No major events overnight of this morning.  Difficulty sleeping due to left groin and leg pain.  No other complaints.  Objective: Vitals:   02/16/23 0333 02/16/23 0500 02/16/23 0808 02/16/23 1153  BP: 132/72  129/69 128/72  Pulse: 82     Resp: 16 16  (!) 22  Temp: 97.8 F (36.6 C)  97.6 F (36.4 C) 98.6 F (37 C)   TempSrc: Oral  Oral Oral  SpO2: 94%     Weight:  124.8 kg    Height:        Examination:  GENERAL: No apparent distress.  Nontoxic. HEENT: MMM.  Vision and hearing grossly intact.  NECK: Supple.  No apparent JVD.  RESP:  No IWOB.  Fair aeration bilaterally. CVS:  RRR. Heart sounds normal.  ABD/GI/GU: BS+. Abd soft, NTND.  MSK/EXT:  Moves extremities. No apparent deformity. No edema.  Palpable lymph node in left upper thigh. SKIN: no apparent skin lesion or wound NEURO: Awake, alert and oriented appropriately.  No apparent focal neuro deficit. PSYCH: Calm. Normal affect.   Procedures:  8/1-DCCV for A-fib with RVR.  Microbiology summarized: MRSA PCR screen negative Blood cultures NGTD Urine culture with multiple species.  Assessment and plan: Principal Problem:   Hypotension Active Problems:   Lymphoma, small lymphocytic (HCC)   Question of Richter's transformation   AKI (acute kidney injury) (HCC)   Chronic lymphocytic leukemia (CLL), B-cell (HCC)   Coronary artery disease involving native coronary artery of native heart with angina pectoris (HCC)   Dyslipidemia, goal LDL below 70   Frequent PVCs   Poor appetite   Low energy   Elevated lactic acid level   Dehydration   Hyponatremia   Elevated liver enzymes   Hyperglycemia   Atrial fibrillation with RVR (HCC)   Hemodynamic instability  Acute Kidney Injury: Suspect prerenal in the setting of poor p.o. intake and hypotension.  Also on Micardis at home. Recent Labs    04/11/22 1423 05/29/22 0904 09/16/22 1510 01/23/23  1011 02/12/23 0919 02/12/23 1054 02/13/23 0402 02/14/23 0126 02/15/23 0547 02/16/23 0201  BUN 19 19 21  29* 24* 25* 28* 30* 29* 23  CREATININE 1.03 1.09 1.13 1.17 1.30* 1.48* 1.15 1.36* 1.20 1.14  -Monitor off IV fluid. -Avoid nephrotoxic meds   New onset A-fib with RVR: Initially started on Cardizem drip but became hypotensive.  Successful DCCV the evening of 8/1.  CHA2DS2-VASc score at  least 3.  TTE with hyperdynamic LV function with LVOT obstruction but no HCM TSH within normal. -Appreciate cardiology input-recommends uninterrupted anticoagulation at least for 30 days after DCCV -On amiodarone and IV heparin -Optimize electrolytes  Hyperdynamic LV function with LVOT obstruction, likely secondary to overall illness, no definite HCM.  -repeat echo limited before dismissal or in 1-2 weeks pending clinical course.  -Avoid hypotension.  Hypotension: Likely due to dehydration and concurrent use of antihypertensive meds. TTE with hyperdynamic LV function with LVOT obstruction but no HCM. TSH and cortisol within normal.  Resolved. -Continue holding antihypertensive meds -Discontinue midodrine and IV fluid.  Generalized weakness: Likely due to the above.  Improved. -Treat treatable causes  Possible urinary tract infection: Some dysuria that he attributes to lying in bed.  No fever or chills.  UA with nitrites.  Urine culture with multiple species.  Immunocompromised patient -Empirically treated with IV ceftriaxone for 3 days.  CLL with small lymphocytic lymphoma/cancer-related left leg and groin pain: Followed by Dr. Ellin Saba.  Was out of ibrutinib for 4 days prior to presentation.  Concern about Richter's transformation. Transferred to [facility] for bone biopsy. -CT bone biopsy by IR -Tylenol, oxycodone and IV Dilaudid as needed.   Dehydration: Resolved.   Hypokalemia -Monitor replenish as appropriate  Lactic Acidosis: Likely due to hypotension and dehydration.  Resolved.   CAD/dyslipidemia: No anginal symptoms.  TTE as above. -Continue Lipitor and aspirin. -Watch LFT while on Lipitor.   Hyponatremia: Resolved.   Elevated liver Enzymes/mild hyperbilirubinemia: Mild.  Possibly from ibrutinib.  No significant hepatobiliary finding on CT. HIV and acute hepatitis panel negative. -Monitor.  Normocytic anemia/thrombocytopenia/splenomegaly: Likely from malignancy.   Stable. -Monitor  Obesity Body mass index is 33.49 kg/m.           DVT prophylaxis:  SCDs Start: 02/12/23 1503 On full dose anticoagulation  Code Status: Full code Family Communication: Updated patient's wife at bedside Level of care: Telemetry Medical Status is: Inpatient Remains inpatient appropriate because:  new onset A-fib and need for CT bone biopsy   Final disposition: Home Consultants:  Oncology Cardiology Interventional radiology  55 minutes with more than 50% spent in reviewing records, counseling patient/family and coordinating care.   Sch Meds:  Scheduled Meds:  atorvastatin  40 mg Oral QPM   Continuous Infusions:  amiodarone 30 mg/hr (02/16/23 0927)   cefTRIAXone (ROCEPHIN)  IV 2 g (02/16/23 0921)   heparin 1,900 Units/hr (02/16/23 1242)   PRN Meds:.acetaminophen **OR** acetaminophen, albuterol, bisacodyl, fentaNYL (SUBLIMAZE) injection, guaiFENesin, HYDROcodone-acetaminophen, ondansetron **OR** ondansetron (ZOFRAN) IV, prochlorperazine  Antimicrobials: Anti-infectives (From admission, onward)    Start     Dose/Rate Route Frequency Ordered Stop   02/14/23 0800  cefTRIAXone (ROCEPHIN) 2 g in sodium chloride 0.9 % 100 mL IVPB        2 g 200 mL/hr over 30 Minutes Intravenous Every 24 hours 02/14/23 0718          I have personally reviewed the following labs and images: CBC: Recent Labs  Lab 02/12/23 0919 02/12/23 1054 02/13/23 0402 02/14/23 0126 02/15/23 0547  02/16/23 0201  WBC 7.6 7.5 7.0 8.3 10.5 13.9*  NEUTROABS 5.1 5.7  --   --   --   --   HGB 14.0 12.8* 11.8* 12.3* 12.6* 12.6*  HCT 41.9 37.9* 35.4* 36.9* 37.6* 37.9*  MCV 97.4 96.9 97.8 95.6 95.4 96.2  PLT 157 149* 120* 146* 151 139*   BMP &GFR Recent Labs  Lab 02/12/23 0919 02/12/23 1054 02/13/23 0402 02/14/23 0126 02/15/23 0547 02/16/23 0201  NA 133* 131* 134* 136 135 136  K 3.6 3.5 3.0* 3.5 3.3* 3.9  CL 96* 96* 98 103 103 103  CO2 22 22 23 22 22  21*  GLUCOSE 118*  114* 91 101* 106* 99  BUN 24* 25* 28* 30* 29* 23  CREATININE 1.30* 1.48* 1.15 1.36* 1.20 1.14  CALCIUM 9.4 8.9 8.8* 8.6* 8.8* 8.8*  MG 1.9  --  2.0 2.0 2.0 2.0  PHOS  --   --   --   --  2.7 2.8   Estimated Creatinine Clearance: 93.2 mL/min (by C-G formula based on SCr of 1.14 mg/dL). Liver & Pancreas: Recent Labs  Lab 02/12/23 0919 02/12/23 1054 02/13/23 0402 02/14/23 0126 02/15/23 0547 02/16/23 0201  AST 49* 45* 41 45* 44*  --   ALT 49* 45* 44 45* 45*  --   ALKPHOS 107 96 96 89 107  --   BILITOT 4.6* 3.7* 2.6* 2.6* 2.5*  --   PROT 6.6 5.8* 5.3* 4.9* 5.0*  --   ALBUMIN 3.9 3.4* 3.1* 2.7* 2.8* 2.8*   No results for input(s): "LIPASE", "AMYLASE" in the last 168 hours. No results for input(s): "AMMONIA" in the last 168 hours. Diabetic: No results for input(s): "HGBA1C" in the last 72 hours. Recent Labs  Lab 02/13/23 2226  GLUCAP 94   Cardiac Enzymes: No results for input(s): "CKTOTAL", "CKMB", "CKMBINDEX", "TROPONINI" in the last 168 hours. No results for input(s): "PROBNP" in the last 8760 hours. Coagulation Profile: Recent Labs  Lab 02/12/23 1054  INR 1.2   Thyroid Function Tests: Recent Labs    02/14/23 0209  TSH 1.380   Lipid Profile: No results for input(s): "CHOL", "HDL", "LDLCALC", "TRIG", "CHOLHDL", "LDLDIRECT" in the last 72 hours. Anemia Panel: No results for input(s): "VITAMINB12", "FOLATE", "FERRITIN", "TIBC", "IRON", "RETICCTPCT" in the last 72 hours. Urine analysis:    Component Value Date/Time   COLORURINE AMBER (A) 02/12/2023 2023   APPEARANCEUR HAZY (A) 02/12/2023 2023   LABSPEC 1.044 (H) 02/12/2023 2023   PHURINE 6.0 02/12/2023 2023   GLUCOSEU NEGATIVE 02/12/2023 2023   HGBUR MODERATE (A) 02/12/2023 2023   BILIRUBINUR SMALL (A) 02/12/2023 2023   KETONESUR NEGATIVE 02/12/2023 2023   PROTEINUR 100 (A) 02/12/2023 2023   NITRITE POSITIVE (A) 02/12/2023 2023   LEUKOCYTESUR NEGATIVE 02/12/2023 2023   Sepsis Labs: Invalid input(s):  "PROCALCITONIN", "LACTICIDVEN"  Microbiology: Recent Results (from the past 240 hour(s))  Blood Culture (routine x 2)     Status: None (Preliminary result)   Collection Time: 02/12/23 10:54 AM   Specimen: BLOOD  Result Value Ref Range Status   Specimen Description BLOOD BLOOD RIGHT HAND  Final   Special Requests   Final    BOTTLES DRAWN AEROBIC AND ANAEROBIC Blood Culture results may not be optimal due to an excessive volume of blood received in culture bottles   Culture   Final    NO GROWTH 4 DAYS Performed at [facility], [address] [place], [place] [address]    Report Status PENDING  Incomplete  Blood Culture (routine x 2)     Status: None (Preliminary result)   Collection Time: 02/12/23 10:54 AM   Specimen: BLOOD  Result Value Ref Range Status   Specimen Description BLOOD BLOOD LEFT ARM  Final   Special Requests   Final    BOTTLES DRAWN AEROBIC AND ANAEROBIC Blood Culture adequate volume   Culture   Final    NO GROWTH 4 DAYS Performed at [facility], [address], [place], [place] [address]    Report Status PENDING  Incomplete  Urine Culture     Status: Abnormal   Collection Time: 02/12/23  8:23 PM   Specimen: Urine, Random  Result Value Ref Range Status   Specimen Description   Final    URINE, RANDOM Performed at [facility], [address], [place], [place] [address]    Special Requests   Final    NONE Reflexed from [id number] Performed at [facility], [address], [place], [place] [address]    Culture MULTIPLE SPECIES PRESENT, SUGGEST RECOLLECTION (A)  Final   Report Status 02/14/2023 FINAL  Final  MRSA Next Gen by PCR, Nasal     Status: None   Collection Time: 02/13/23  4:40 PM   Specimen: Nasal Mucosa; Nasal Swab  Result Value Ref Range Status   MRSA by PCR Next Gen NOT DETECTED NOT DETECTED Final    Comment: (NOTE) The GeneXpert MRSA Assay (FDA approved for NASAL specimens only), is one component of a comprehensive MRSA colonization  surveillance program. It is not intended to diagnose MRSA infection nor to guide or monitor treatment for MRSA infections. Test performance is not FDA approved in patients less than 93 years old. Performed at [facility], [address] [place], [place] [address]     Radiology Studies: No results found.     T.  [facility]  If 7PM-7AM, please contact night-coverage [email or web address] 02/16/2023, 2:15 PM

## 2023-02-16 NOTE — Progress Notes (Signed)
ANTICOAGULATION CONSULT NOTE - Follow Up Consult  Pharmacy Consult for heparin Indication: atrial fibrillation  Labs: Recent Labs    02/14/23 0126 02/14/23 2014 02/15/23 0547 02/15/23 1552 02/16/23 0201  HGB 12.3*  --  12.6*  --  12.6*  HCT 36.9*  --  37.6*  --  37.9*  PLT 146*  --  151  --  139*  HEPARINUNFRC  --    < > 0.27* 0.29* 0.27*  CREATININE 1.36*  --  1.20  --  1.14   < > = values in this interval not displayed.    Assessment: 67yo male subtherapeutic on heparin with lower heparin level despite increased rate; no infusion issues or signs of bleeding per RN.  Goal of Therapy:  Heparin level 0.3-0.7 units/ml   Plan:  Increase heparin infusion by 1-2 units/kg/hr to 1900 units/hr. Check level in 6 hours.   Vernard Gambles, PharmD, BCPS 02/16/2023 3:21 AM

## 2023-02-16 NOTE — Progress Notes (Signed)
Rounding Note    Patient Name: Adam Reyes Date of Encounter: 02/16/2023  [facility] Cardiologist: Bryan Lemma, MD   Subjective   No sob or cp, some continued pelvic pain  Inpatient Medications    Scheduled Meds:  atorvastatin  40 mg Oral QPM   Continuous Infusions:  amiodarone 30 mg/hr (02/16/23 0927)   cefTRIAXone (ROCEPHIN)  IV 2 g (02/16/23 0921)   heparin 1,900 Units/hr (02/16/23 0340)   PRN Meds: acetaminophen **OR** acetaminophen, albuterol, bisacodyl, fentaNYL (SUBLIMAZE) injection, guaiFENesin, HYDROcodone-acetaminophen, ondansetron **OR** ondansetron (ZOFRAN) IV, prochlorperazine   Vital Signs    Vitals:   02/15/23 2306 02/16/23 0333 02/16/23 0500 02/16/23 0808  BP: 133/72 132/72  129/69  Pulse: 77 82    Resp: 17 16 16    Temp: 97.7 F (36.5 C) 97.8 F (36.6 C)  97.6 F (36.4 C)  TempSrc: Oral Oral  Oral  SpO2: 92% 94%    Weight:   124.8 kg   Height:        Intake/Output Summary (Last 24 hours) at 02/16/2023 1024 Last data filed at 02/16/2023 1610 Gross per 24 hour  Intake 1900.41 ml  Output 1025 ml  Net 875.41 ml      02/16/2023    5:00 AM 02/15/2023    5:00 AM 02/14/2023    5:00 AM  Last 3 Weights  Weight (lbs) 275 lb 2.2 oz 270 lb 11.6 oz 269 lb 2.9 oz  Weight (kg) 124.8 kg 122.8 kg 122.1 kg      Telemetry    SR - Personally Reviewed  ECG    SR nonspecific st abnl - Personally Reviewed  Physical Exam   GEN: No acute distress.   Neck: No JVD Cardiac: normal s1 and s2. Faint 1/6 systolic murmur Respiratory: Clear to auscultation bilaterally. GI: Soft, nontender, non-distended  MS: No edema; No deformity. Pulses 4/4 distally Neuro:  Nonfocal  Psych: Normal affect   Labs    High Sensitivity Troponin:  No results for input(s): "TROPONINIHS" in the last 720 hours.   Chemistry Recent Labs  Lab 02/13/23 0402 02/14/23 0126 02/15/23 0547 02/16/23 0201  NA 134* 136 135 136  K 3.0* 3.5 3.3* 3.9  CL 98 103 103 103  CO2  23 22 22  21*  GLUCOSE 91 101* 106* 99  BUN 28* 30* 29* 23  CREATININE 1.15 1.36* 1.20 1.14  CALCIUM 8.8* 8.6* 8.8* 8.8*  MG 2.0 2.0 2.0 2.0  PROT 5.3* 4.9* 5.0*  --   ALBUMIN 3.1* 2.7* 2.8* 2.8*  AST 41 45* 44*  --   ALT 44 45* 45*  --   ALKPHOS 96 89 107  --   BILITOT 2.6* 2.6* 2.5*  --   GFRNONAA >60 58* >60 >60  ANIONGAP 13 11 10 12     Lipids No results for input(s): "CHOL", "TRIG", "HDL", "LABVLDL", "LDLCALC", "CHOLHDL" in the last 168 hours.  Hematology Recent Labs  Lab 02/14/23 0126 02/15/23 0547 02/16/23 0201  WBC 8.3 10.5 13.9*  RBC 3.86* 3.94* 3.94*  HGB 12.3* 12.6* 12.6*  HCT 36.9* 37.6* 37.9*  MCV 95.6 95.4 96.2  MCH 31.9 32.0 32.0  MCHC 33.3 33.5 33.2  RDW 14.4 14.6 14.8  PLT 146* 151 139*   Thyroid  Recent Labs  Lab 02/14/23 0209  TSH 1.380    BNPNo results for input(s): "BNP", "PROBNP" in the last 168 hours.  DDimer No results for input(s): "DDIMER" in the last 168 hours.   Radiology  ECHOCARDIOGRAM COMPLETE  Result Date: 02/14/2023    ECHOCARDIOGRAM REPORT   Patient Name:   Adam Reyes Date of Exam: 02/14/2023 Medical Rec #:  [id number]     Height:       76.0 in Accession #:    [id number]    Weight:       269.2 lb Date of Birth:  11-30-1955      BSA:          2.513 m Patient Age:    67 years      BP:           97/52 mmHg Patient Gender: M             HR:           85 bpm. Exam Location:  Inpatient Procedure: 2D Echo, Cardiac Doppler, Color Doppler and Intracardiac            Opacification Agent Indications:    Atrial fibrillation  History:        Patient has prior history of Echocardiogram examinations, most                 recent 07/24/2021. CAD and Previous Myocardial Infarction; Risk                 Factors:Hypertension. Lymphoma.  Sonographer:    Milda Smart Referring Phys: [id number] Boyce Medici GONFA  Sonographer Comments: Image acquisition challenging due to patient body habitus and Image acquisition challenging due to respiratory motion. IMPRESSIONS  1.  Left ventricular ejection fraction, by estimation, is 70 to 75%. The left ventricle has hyperdynamic function. The left ventricle has no regional wall motion abnormalities. There is mild asymmetric left ventricular hypertrophy of the basal-septal segment. Peak LV outflow tract gradient 94 mmHg. Left ventricular diastolic parameters are consistent with Grade II diastolic dysfunction (pseudonormalization).  2. Right ventricular systolic function is normal. The right ventricular size is normal. Tricuspid regurgitation signal is inadequate for assessing PA pressure.  3. Left atrial size was mildly dilated.  4. There is mitral valve systolic anterior motion. The mitral valve is abnormal. Trivial mitral valve regurgitation. No evidence of mitral stenosis.  5. The aortic valve is tricuspid. Aortic valve regurgitation is mild to moderate. No aortic stenosis is present.  6. Aortic dilatation noted. There is mild dilatation of the ascending aorta, measuring 40 mm.  7. The inferior vena cava is normal in size with greater than 50% respiratory variability, suggesting right atrial pressure of 3 mmHg.  8. Vigorous LV systolic function with LVOT gradient and SAM. Patient has HOCM physiology. FINDINGS  Left Ventricle: Left ventricular ejection fraction, by estimation, is 70 to 75%. The left ventricle has hyperdynamic function. The left ventricle has no regional wall motion abnormalities. Definity contrast agent was given IV to delineate the left ventricular endocardial borders. The left ventricular internal cavity size was normal in size. There is mild asymmetric left ventricular hypertrophy of the basal-septal segment. Left ventricular diastolic parameters are consistent with Grade II diastolic  dysfunction (pseudonormalization). Right Ventricle: The right ventricular size is normal. No increase in right ventricular wall thickness. Right ventricular systolic function is normal. Tricuspid regurgitation signal is inadequate for  assessing PA pressure. Left Atrium: Left atrial size was mildly dilated. Right Atrium: Right atrial size was normal in size. Pericardium: There is no evidence of pericardial effusion. Mitral Valve: There is mitral valve systolic anterior motion. The mitral valve is abnormal. Trivial mitral valve regurgitation. No evidence of mitral valve stenosis.  MV peak gradient, 6.0 mmHg. The mean mitral valve gradient is 3.0 mmHg. Tricuspid Valve: The tricuspid valve is normal in structure. Tricuspid valve regurgitation is trivial. Aortic Valve: The aortic valve is tricuspid. Aortic valve regurgitation is mild to moderate. Aortic regurgitation PHT measures 450 msec. No aortic stenosis is present. Aortic valve mean gradient measures 48.0 mmHg. Aortic valve peak gradient measures 93.7 mmHg. Pulmonic Valve: The pulmonic valve was normal in structure. Pulmonic valve regurgitation is trivial. Aorta: The aortic root is normal in size and structure and aortic dilatation noted. There is mild dilatation of the ascending aorta, measuring 40 mm. Venous: The inferior vena cava is normal in size with greater than 50% respiratory variability, suggesting right atrial pressure of 3 mmHg. IAS/Shunts: No atrial level shunt detected by color flow Doppler.  LEFT VENTRICLE PLAX 2D LVIDd:         5.50 cm      Diastology LVIDs:         3.10 cm      LV e' medial:    6.96 cm/s LV PW:         1.00 cm      LV E/e' medial:  10.6 LV IVS:        0.80 cm      LV e' lateral:   8.16 cm/s LVOT diam:     2.40 cm      LV E/e' lateral: 9.1 LVOT Area:     4.52 cm  LV Volumes (MOD) LV vol d, MOD A2C: 135.0 ml LV vol d, MOD A4C: 130.0 ml LV vol s, MOD A2C: 33.0 ml LV vol s, MOD A4C: 38.4 ml LV SV MOD A2C:     102.0 ml LV SV MOD A4C:     130.0 ml LV SV MOD BP:      97.9 ml RIGHT VENTRICLE RV Basal diam:  2.60 cm RV S prime:     21.10 cm/s TAPSE (M-mode): 2.5 cm LEFT ATRIUM             Index        RIGHT ATRIUM           Index LA diam:        3.60 cm 1.43 cm/m   RA  Area:     14.20 cm LA Vol (A2C):   83.1 ml 33.06 ml/m  RA Volume:   31.00 ml  12.33 ml/m LA Vol (A4C):   84.0 ml 33.42 ml/m LA Biplane Vol: 90.8 ml 36.13 ml/m  AORTIC VALVE AV Vmax:      484.00 cm/s AV Vmean:     329.000 cm/s AV VTI:       0.756 m AV Peak Grad: 93.7 mmHg AV Mean Grad: 48.0 mmHg AI PHT:       450 msec  AORTA Ao Root diam: 3.60 cm Ao Asc diam:  4.00 cm MITRAL VALVE MV Peak grad: 6.0 mmHg     SHUNTS MV Mean grad: 3.0 mmHg     Systemic Diam: 2.40 cm MV Vmax:      1.22 m/s MV Vmean:     81.3 cm/s MR Peak grad: 96.0 mmHg MR Vmax:      490.00 cm/s MV E velocity: 73.90 cm/s MV A velocity: 89.90 cm/s MV E/A ratio:  0.82 Dalton McleanMD Electronically signed by Wilfred Lacy Signature Date/Time: 02/14/2023/11:17:54 AM    Final     Cardiac Studies   Echo pending  Patient Profile     67 y.o. male  with a history of STEMI with proximal LAD 50% and ostial D1 55% with recommendations to treat medically, frequent PVCs on cardiac monitor, hypertension, hyperlipidemia, anemia lymphoma and history of GI bleed who presents to the hospital for coordination of biopsy of lymph node, subsequently noted to have decompensation with rapid atrial fibrillation necessitating urgent cardioversion.  Assessment & Plan    Principal Problem:   Hypotension Active Problems:   Chronic lymphocytic leukemia (CLL), B-cell (HCC)   Coronary artery disease involving native coronary artery of native heart with angina pectoris (HCC)   Dyslipidemia, goal LDL below 70   Lymphoma, small lymphocytic (HCC)   Frequent PVCs   Poor appetite   Low energy   Question of Richter's transformation   Elevated lactic acid level   Dehydration   Hyponatremia   Elevated liver enzymes   AKI (acute kidney injury) (HCC)   Hyperglycemia   Atrial fibrillation with RVR (HCC)   Hemodynamic instability  1.  A-fib with rapid ventricular response -New diagnosis, presented unstable with hypotension, required urgent cardioversion.  Has  continued on amiodarone infusion and has been transition to heparin infusion, needs going anticoagulation uninterrupted for 30 days at a minimum due to DCCV.  -Echocardiogram with hyperdynamic function and LVOT obstruction, responded very well to volume resuscitation, murmur nearly resolved and BP stable. Midodrine stopped 8/2.   CHA2DS2-VASc Score = 3  The patient's score is based upon: CHF History: 0 HTN History: 1 Diabetes History: 0 Stroke History: 0 Vascular Disease History: 1 Age Score: 1 Gender Score: 0   2.  Coronary artery disease-prior presentation with report of STEMI but disease felt amenable to medical management, follows with Dr. Herbie Baltimore.  Lipids with good control as of November 2023, LDL 65.  Continues on atorvastatin 40 mg daily.  LFTs appear mildly elevated, likely in the setting of hypotension, follow.  3. Hyperdynamic LV function with LVOT obstruction, likely secondary to overall illness, no definite HCM. Will reassess when patient has been volume loaded and optimized. Can repeat echo limited before dismissal or in 1-2 weeks pending clinical course.   Patient eagerly awaiting biopsy. No further cardiac testing or interventions required prior to proceeding. LVOT obstruction likely greatly improved based on exam by volume resuscitation. No sign of volume overload today.   For questions or updates, please contact [facility] Please consult [email or web address] for contact info under        Signed, Parke Poisson, MD  02/16/2023, 10:24 AM

## 2023-02-17 ENCOUNTER — Inpatient Hospital Stay (HOSPITAL_COMMUNITY): Payer: PPO

## 2023-02-17 ENCOUNTER — Encounter (HOSPITAL_COMMUNITY): Payer: Self-pay | Admitting: Family Medicine

## 2023-02-17 DIAGNOSIS — I9589 Other hypotension: Secondary | ICD-10-CM | POA: Diagnosis not present

## 2023-02-17 DIAGNOSIS — E871 Hypo-osmolality and hyponatremia: Secondary | ICD-10-CM | POA: Diagnosis not present

## 2023-02-17 DIAGNOSIS — C9110 Chronic lymphocytic leukemia of B-cell type not having achieved remission: Secondary | ICD-10-CM | POA: Diagnosis not present

## 2023-02-17 DIAGNOSIS — N179 Acute kidney failure, unspecified: Secondary | ICD-10-CM | POA: Diagnosis not present

## 2023-02-17 DIAGNOSIS — I4891 Unspecified atrial fibrillation: Secondary | ICD-10-CM | POA: Diagnosis not present

## 2023-02-17 DIAGNOSIS — I25119 Atherosclerotic heart disease of native coronary artery with unspecified angina pectoris: Secondary | ICD-10-CM | POA: Diagnosis not present

## 2023-02-17 LAB — HEPARIN LEVEL (UNFRACTIONATED): Heparin Unfractionated: 0.12 IU/mL — ABNORMAL LOW (ref 0.30–0.70)

## 2023-02-17 LAB — PROTIME-INR
INR: 1.1 (ref 0.8–1.2)
Prothrombin Time: 14.3 seconds (ref 11.4–15.2)

## 2023-02-17 MED ORDER — HEPARIN (PORCINE) 25000 UT/250ML-% IV SOLN
2300.0000 [IU]/h | INTRAVENOUS | Status: DC
  Administered 2023-02-17: 2100 [IU]/h via INTRAVENOUS
  Administered 2023-02-18: 2300 [IU]/h via INTRAVENOUS
  Filled 2023-02-17 (×2): qty 250

## 2023-02-17 MED ORDER — LIDOCAINE HCL (PF) 1 % IJ SOLN
INTRAMUSCULAR | Status: AC
  Filled 2023-02-17: qty 30

## 2023-02-17 MED ORDER — AMIODARONE HCL 200 MG PO TABS: 200.0000 mg | ORAL_TABLET | Freq: Every day | ORAL | Status: DC

## 2023-02-17 MED ORDER — POTASSIUM CHLORIDE CRYS ER 20 MEQ PO TBCR
40.0000 meq | EXTENDED_RELEASE_TABLET | Freq: Once | ORAL | Status: AC
  Administered 2023-02-17: 40 meq via ORAL
  Filled 2023-02-17: qty 2

## 2023-02-17 MED ORDER — MIDAZOLAM HCL 2 MG/2ML IJ SOLN
INTRAMUSCULAR | Status: AC | PRN
  Administered 2023-02-17: 1 mg via INTRAVENOUS

## 2023-02-17 MED ORDER — MIDAZOLAM HCL 2 MG/2ML IJ SOLN
INTRAMUSCULAR | Status: AC
  Filled 2023-02-17: qty 2

## 2023-02-17 MED ORDER — FENTANYL CITRATE (PF) 100 MCG/2ML IJ SOLN
INTRAMUSCULAR | Status: AC
  Filled 2023-02-17: qty 2

## 2023-02-17 MED ORDER — FENTANYL CITRATE (PF) 100 MCG/2ML IJ SOLN
INTRAMUSCULAR | Status: AC | PRN
  Administered 2023-02-17: 50 ug via INTRAVENOUS

## 2023-02-17 MED ORDER — LIDOCAINE HCL (PF) 1 % IJ SOLN
2.0000 mL | Freq: Once | INTRAMUSCULAR | Status: AC
  Administered 2023-02-17: 2 mL

## 2023-02-17 MED ORDER — AMIODARONE HCL 200 MG PO TABS
400.0000 mg | ORAL_TABLET | Freq: Two times a day (BID) | ORAL | Status: DC
  Administered 2023-02-17 – 2023-02-18 (×3): 400 mg via ORAL
  Filled 2023-02-17 (×3): qty 2

## 2023-02-17 MED ORDER — GELATIN ABSORBABLE 12-7 MM EX MISC
1.0000 | Freq: Once | CUTANEOUS | Status: AC
  Administered 2023-02-17: 1 via TOPICAL
  Filled 2023-02-17: qty 1

## 2023-02-17 MED ORDER — LIDOCAINE HCL 1 % IJ SOLN
INTRAMUSCULAR | Status: AC | PRN
  Administered 2023-02-17: 5 mL

## 2023-02-17 NOTE — Progress Notes (Signed)
Back from IR by bed awake and alert. Left groin band aid intact. Left dorsalis  + 2 pulses. Warm to touch bedrest emphasized. Continue to monitor.

## 2023-02-17 NOTE — Progress Notes (Addendum)
PROGRESS NOTE  Adam Reyes WJX:[id number] DOB: 13-Feb-1956   PCP: Anabel Halon, MD  Patient is from: Home.  Lives with his wife.  Independently ambulates at baseline.  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  Reportedly had poor p.o. intake, fatigue, diaphoresis, dizziness and myalgia before he went to cancer center.  There was concern about Richter's transformation of his malignancy.  His oncologist, Dr. Ellin Saba recommended transfer to [facility] for bone biopsy.   Hospital course complicated by new onset A-fib with RVR for which she was initially started on Cardizem drip but became hypotensive.  Cardiology consulted.  He was switched to IV amiodarone and underwent DCCV the night of 8/1-2 and converted to sinus rhythm.  On amiodarone and heparin.  TTE with hyperdynamic LV function with LVOT obstruction but no HCM.  Cardiology recommends repeat echo in 1 to 2 weeks  Patient improved clinically.  Blood cultures NGTD.  Urine culture with multiple species.  Completed 3 days of IV ceftriaxone empirically.  Bone biopsy today, 8/5.  Likely discharge home on 8/6.  Subjective: Seen and examined earlier this morning.  No major events overnight of this morning.  No complaints.  Left groin/thigh pain improved with adjustment of pain medication.  He was able to stand up on Juengel scale without pain today.  Bone biopsy scheduled for today.  Objective: Vitals:   02/16/23 1930 02/16/23 2339 02/17/23 0545 02/17/23 0714   BP: 134/69 108/69 (!) 109/59 120/62  Pulse:      Resp: 20 14 15 18   Temp: 98.5 F (36.9 C) 98.1 F (36.7 C) 98.2 F (36.8 C) 98.2 F (36.8 C)  TempSrc: Oral Oral Oral Oral  SpO2: 99% 99% 99% 99%  Weight:   125.7 kg   Height:        Examination:  GENERAL: No apparent distress.  Nontoxic. HEENT: MMM.  Vision and hearing grossly intact.  NECK: Supple.  No apparent JVD.  RESP:  No IWOB.  Fair aeration bilaterally. CVS:  RRR. Heart sounds normal.  ABD/GI/GU: BS+. Abd soft, NTND.  MSK/EXT:  Moves extremities. No apparent deformity.  Trace edema. SKIN: no apparent skin lesion or wound NEURO: Awake, alert and oriented appropriately.  No apparent focal neuro deficit. PSYCH: Calm. Normal affect.   Procedures:  8/1-DCCV for A-fib with RVR.  Microbiology summarized: MRSA PCR screen negative Blood cultures NGTD Urine culture with multiple species.  Assessment and plan: Principal Problem:   Hypotension Active Problems:   Lymphoma, small lymphocytic (HCC)   Question of Richter's transformation   AKI (acute kidney injury) (HCC)   Chronic lymphocytic leukemia (CLL), B-cell (HCC)   Coronary artery disease involving native coronary artery of native heart with angina pectoris (HCC)   Dyslipidemia, goal LDL below 70   Frequent PVCs   Poor appetite   Low energy   Elevated lactic acid level   Dehydration   Hyponatremia   Elevated liver enzymes   Hyperglycemia   Atrial fibrillation with RVR (HCC)   Hemodynamic instability  Acute Kidney Injury: Suspect prerenal in the setting of poor p.o. intake and hypotension.  Also on Micardis at home.  AKI resolved.  Renal function is stable off IV fluid. Recent Labs    05/29/22 0904 09/16/22 1510 01/23/23 1011 02/12/23 0919 02/12/23 1054 02/13/23 0402 02/14/23 0126 02/15/23 0547 02/16/23 0201 02/17/23 0055  BUN 19 21 29* 24* 25* 28* 30* 29* 23 25*  CREATININE 1.09 1.13 1.17 1.30* 1.48* 1.15 1.36* 1.20 1.14 1.10  -May discontinue  Micardis on discharge.   New onset A-fib with RVR: Felt to be iatrogenic from chemotherapy that he completed.  Initially started on Cardizem drip but became hypotensive.  Successful DCCV the evening of 8/1.  CHA2DS2-VASc score at least 3.  TTE with hyperdynamic LV function with LVOT obstruction but no HCM TSH within normal. -Appreciate cardiology input-recommends uninterrupted anticoagulation at least for 30 days after DCCV -Transition to p.o. amiodarone by cardiology. -Continue IV heparin for anticoagulation until bone biopsy. -Optimize electrolytes  Hyperdynamic LV function with LVOT obstruction, likely secondary to overall illness, no definite HCM.  -Repeat limited TTE in 1-2 weeks pending clinical course.  -Avoid hypotension.  Hypotension: Likely due to dehydration and concurrent use of antihypertensive meds. TTE with hyperdynamic LV function with LVOT obstruction but no HCM. TSH and cortisol within normal.  Resolved. -Continue holding antihypertensive meds -Discontinue midodrine and IV fluid.  Generalized weakness: Likely due to the above.  Improved. -Treat treatable causes  Possible urinary tract infection: Some dysuria that he attributes to lying in bed.  No fever or chills.  UA with nitrites.  Urine culture with multiple species.  Immunocompromised patient -Empirically treated with IV ceftriaxone for 3 days.  CLL with small lymphocytic lymphoma/cancer-related left leg and groin pain: Followed by Dr. Ellin Saba.  Was out of ibrutinib for 4 days prior to presentation.  Concern about Richter's transformation. Transferred to [facility] for bone biopsy.  Pain improved with adjustment of pain medications. -CT bone biopsy by IR -Tylenol, oxycodone and IV Dilaudid as needed.   Dehydration: Resolved.   Hypokalemia -Monitor replenish as appropriate  Lactic Acidosis: Likely due to hypotension and dehydration.  Resolved.   CAD/dyslipidemia: No anginal symptoms.  TTE as above. -Continue  Lipitor and aspirin. -Watch LFT while on Lipitor.   Hyponatremia: Resolved.   Elevated liver Enzymes/mild hyperbilirubinemia: Mild.  Possibly from ibrutinib.  No significant hepatobiliary finding on CT. HIV and acute hepatitis panel negative. -Monitor.  Normocytic anemia/thrombocytopenia/splenomegaly: Likely from malignancy.  Stable. -Monitor  Leukocytosis: Likely from underlying lymphoma.  Doubt active infection. -Continue monitoring  Obesity Body mass index is 33.73 kg/m.           DVT prophylaxis:  SCDs Start: 02/12/23 1503 On full dose anticoagulation  Code Status: Full code Family Communication: Updated patient's wife at bedside Level of care: Telemetry Medical Status is: Inpatient Remains inpatient appropriate because:  new onset A-fib and need for CT bone biopsy   Final disposition: Home Consultants:  Oncology Cardiology Interventional radiology  35 minutes with more than 50% spent in reviewing records, counseling patient/family and coordinating care.   Sch Meds:  Scheduled Meds:  [START ON 02/24/2023] amiodarone  200 mg Oral Daily   amiodarone  400 mg Oral BID   atorvastatin  40 mg Oral QPM   Continuous Infusions:  heparin Stopped (02/17/23 0930)   PRN Meds:.acetaminophen **OR** acetaminophen, albuterol, bisacodyl, guaiFENesin, HYDROmorphone (DILAUDID) injection, ondansetron **OR** ondansetron (ZOFRAN) IV, oxyCODONE, prochlorperazine  Antimicrobials: Anti-infectives (From admission, onward)    Start     Dose/Rate Route Frequency Ordered Stop   02/14/23 0800  cefTRIAXone (ROCEPHIN)  2 g in sodium chloride 0.9 % 100 mL IVPB  Status:  Discontinued        2 g 200 mL/hr over 30 Minutes Intravenous Every 24 hours 02/14/23 0718 02/16/23 1420        I have personally reviewed the following labs and images: CBC: Recent Labs  Lab 02/12/23 0919 02/12/23 1054 02/13/23 0402 02/14/23 0126 02/15/23 0547 02/16/23 0201 02/17/23 0055  WBC 7.6 7.5 7.0  8.3 10.5 13.9* 17.9*  NEUTROABS 5.1 5.7  --   --   --   --   --   HGB 14.0 12.8* 11.8* 12.3* 12.6* 12.6* 13.0  HCT 41.9 37.9* 35.4* 36.9* 37.6* 37.9* 38.2*  MCV 97.4 96.9 97.8 95.6 95.4 96.2 97.2  PLT 157 149* 120* 146* 151 139* 128*   BMP &GFR Recent Labs  Lab 02/13/23 0402 02/14/23 0126 02/15/23 0547 02/16/23 0201 02/17/23 0055  NA 134* 136 135 136 134*  K 3.0* 3.5 3.3* 3.9 3.6  CL 98 103 103 103 100  CO2 23 22 22  21* 22  GLUCOSE 91 101* 106* 99 96  BUN 28* 30* 29* 23 25*  CREATININE 1.15 1.36* 1.20 1.14 1.10  CALCIUM 8.8* 8.6* 8.8* 8.8* 8.8*  MG 2.0 2.0 2.0 2.0 2.2  PHOS  --   --  2.7 2.8 3.7   Estimated Creatinine Clearance: 97 mL/min (by C-G formula based on SCr of 1.1 mg/dL). Liver & Pancreas: Recent Labs  Lab 02/12/23 0919 02/12/23 1054 02/13/23 0402 02/14/23 0126 02/15/23 0547 02/16/23 0201 02/17/23 0055  AST 49* 45* 41 45* 44*  --   --   ALT 49* 45* 44 45* 45*  --   --   ALKPHOS 107 96 96 89 107  --   --   BILITOT 4.6* 3.7* 2.6* 2.6* 2.5*  --   --   PROT 6.6 5.8* 5.3* 4.9* 5.0*  --   --   ALBUMIN 3.9 3.4* 3.1* 2.7* 2.8* 2.8* 2.7*   No results for input(s): "LIPASE", "AMYLASE" in the last 168 hours. No results for input(s): "AMMONIA" in the last 168 hours. Diabetic: No results for input(s): "HGBA1C" in the last 72 hours. Recent Labs  Lab 02/13/23 2226  GLUCAP 94   Cardiac Enzymes: No results for input(s): "CKTOTAL", "CKMB", "CKMBINDEX", "TROPONINI" in the last 168 hours. No results for input(s): "PROBNP" in the last 8760 hours. Coagulation Profile: Recent Labs  Lab 02/12/23 1054  INR 1.2   Thyroid Function Tests: No results for input(s): "TSH", "T4TOTAL", "FREET4", "T3FREE", "THYROIDAB" in the last 72 hours.  Lipid Profile: No results for input(s): "CHOL", "HDL", "LDLCALC", "TRIG", "CHOLHDL", "LDLDIRECT" in the last 72 hours. Anemia Panel: No results for input(s): "VITAMINB12", "FOLATE", "FERRITIN", "TIBC", "IRON", "RETICCTPCT" in the last  72 hours. Urine analysis:    Component Value Date/Time   COLORURINE AMBER (A) 02/12/2023 2023   APPEARANCEUR HAZY (A) 02/12/2023 2023   LABSPEC 1.044 (H) 02/12/2023 2023   PHURINE 6.0 02/12/2023 2023   GLUCOSEU NEGATIVE 02/12/2023 2023   HGBUR MODERATE (A) 02/12/2023 2023   BILIRUBINUR SMALL (A) 02/12/2023 2023   KETONESUR NEGATIVE 02/12/2023 2023   PROTEINUR 100 (A) 02/12/2023 2023   NITRITE POSITIVE (A) 02/12/2023 2023   LEUKOCYTESUR NEGATIVE 02/12/2023 2023   Sepsis Labs: Invalid input(s): "PROCALCITONIN", "LACTICIDVEN"  Microbiology: Recent Results (from the past 240 hour(s))  Blood Culture (routine x 2)     Status: None   Collection Time: 02/12/23 10:54 AM   Specimen: BLOOD  Result Value Ref  Range Status   Specimen Description BLOOD BLOOD RIGHT HAND  Final   Special Requests   Final    BOTTLES DRAWN AEROBIC AND ANAEROBIC Blood Culture results may not be optimal due to an excessive volume of blood received in culture bottles   Culture   Final    NO GROWTH 5 DAYS Performed at [facility], [address] [place], [place] [address]    Report Status 02/17/2023 FINAL  Final  Blood Culture (routine x 2)     Status: None   Collection Time: 02/12/23 10:54 AM   Specimen: BLOOD  Result Value Ref Range Status   Specimen Description BLOOD BLOOD LEFT ARM  Final   Special Requests   Final    BOTTLES DRAWN AEROBIC AND ANAEROBIC Blood Culture adequate volume   Culture   Final    NO GROWTH 5 DAYS Performed at [facility], [address] [place], [place] [address]    Report Status 02/17/2023 FINAL  Final  Urine Culture     Status: Abnormal   Collection Time: 02/12/23  8:23 PM   Specimen: Urine, Random  Result Value Ref Range Status   Specimen Description   Final    URINE, RANDOM Performed at [facility], [address], [place], [place] [address]    Special Requests   Final    NONE Reflexed from [id number] Performed at [facility], [address], [place], [place] [address]     Culture MULTIPLE SPECIES PRESENT, SUGGEST RECOLLECTION (A)  Final   Report Status 02/14/2023 FINAL  Final  MRSA Next Gen by PCR, Nasal     Status: None   Collection Time: 02/13/23  4:40 PM   Specimen: Nasal Mucosa; Nasal Swab  Result Value Ref Range Status   MRSA by PCR Next Gen NOT DETECTED NOT DETECTED Final    Comment: (NOTE) The GeneXpert MRSA Assay (FDA approved for NASAL specimens only), is one component of a comprehensive MRSA colonization surveillance program. It is not intended to diagnose MRSA infection nor to guide or monitor treatment for MRSA infections. Test performance is not FDA approved in patients less than 73 years old. Performed at [facility], [address], [place], [place] [address]     Radiology Studies: No results found.     T.  [facility]  If 7PM-7AM, please contact night-coverage [email or web address] 02/17/2023, 10:28 AM

## 2023-02-17 NOTE — Procedures (Signed)
Interventional Radiology Procedure Note  Procedure: Ultrasound guided left iliac lymph node biopsy  Findings: Please refer to procedural dictation for full description. Left iliac lymph node 18 ga core bx x 3.   Complications: None immediate  Estimated Blood Loss: < 5 mL  Recommendations: 1 hour bedrest. Resume heparin in 2 hrs. Follow Pathology results.   Marliss Coots, MD

## 2023-02-17 NOTE — Care Management Important Message (Signed)
Important Message  Patient Details  Name: Adam Reyes MRN: [id number] Date of Birth: [birth year]   Medicare Important Message Given:  Yes       02/17/2023, 3:12 PM

## 2023-02-17 NOTE — Plan of Care (Signed)

## 2023-02-17 NOTE — Progress Notes (Signed)
ANTICOAGULATION CONSULT NOTE - Follow Up Consult  Pharmacy Consult for heparin Indication: atrial fibrillation  Labs: Recent Labs    02/15/23 0547 02/15/23 1552 02/16/23 0201 02/16/23 0908 02/16/23 1414 02/17/23 0055  HGB 12.6*  --  12.6*  --   --  13.0  HCT 37.6*  --  37.9*  --   --  38.2*  PLT 151  --  139*  --   --  128*  HEPARINUNFRC 0.27*   < > 0.27* 0.28* 0.32 0.26*  CREATININE 1.20  --  1.14  --   --  1.10   < > = values in this interval not displayed.    Assessment: 67yo male subtherapeutic on heparin after one level at very low end of goal; no infusion issues or signs of bleeding per RN.  Goal of Therapy:  Heparin level 0.3-0.7 units/ml   Plan:  Increase heparin infusion by 10% to 2100 units/hr. Check level in 6 hours.   Vernard Gambles, PharmD, BCPS 02/17/2023 3:13 AM

## 2023-02-17 NOTE — Progress Notes (Signed)
Kept NPO for the procedure.

## 2023-02-17 NOTE — Progress Notes (Signed)
To IR by bed awake and alert.

## 2023-02-17 NOTE — Progress Notes (Signed)
Phlebotomist made aware about INR  to be drawn before procedure.

## 2023-02-17 NOTE — Progress Notes (Signed)
Mobility Specialist Progress Note:   02/17/23 0958  Mobility  Activity Ambulated with assistance in hallway  Level of Assistance Contact guard assist, steadying assist  Assistive Device  (IV Pole)  Distance Ambulated (ft) 80 ft  Activity Response Tolerated well  Mobility Referral Yes  $Mobility charge 1 Mobility  Mobility Specialist Start Time (ACUTE ONLY) 0945  Mobility Specialist Stop Time (ACUTE ONLY) 0957  Mobility Specialist Time Calculation (min) (ACUTE ONLY) 12 min   Pre Mobility: 82 HR , 135/64 BP  During Mobility: 101 HR  Post Mobility: 78 HR ,142/72 BP  Pt received in bed, agreeable to mobility. Independent to stand, CG during ambulation. Pt c/o LLE pain during ambulation, otherwise asymptomatic throughout. Pt returned to bed with call bell in hand and all needs met.   Leory Plowman  Mobility Specialist Please contact via [employer] office at [phone]

## 2023-02-17 NOTE — Progress Notes (Signed)
ANTICOAGULATION CONSULT NOTE Pharmacy Consult for IV heparin  Indication: atrial fibrillation  No Known Allergies  Patient Measurements: Height: 6\' 4"  (193 cm) Weight: 125.7 kg (277 lb 1.9 oz) IBW/kg (Calculated) : 86.8 Heparin Dosing Weight: 112 kg  Vital Signs: Temp: 98.6 F (37 C) (08/05 1221) Temp Source: Oral (08/05 1221) BP: 130/59 (08/05 1230) Pulse Rate: 81 (08/05 1138)  Labs: Recent Labs    02/15/23 0547 02/15/23 1552 02/16/23 0201 02/16/23 0908 02/16/23 1414 02/17/23 0055 02/17/23 1038  HGB 12.6*  --  12.6*  --   --  13.0  --   HCT 37.6*  --  37.9*  --   --  38.2*  --   PLT 151  --  139*  --   --  128*  --   LABPROT  --   --   --   --   --   --  14.3  INR  --   --   --   --   --   --  1.1  HEPARINUNFRC 0.27*   < > 0.27*   < > 0.32 0.26* 0.12*  CREATININE 1.20  --  1.14  --   --  1.10  --    < > = values in this interval not displayed.    Estimated Creatinine Clearance: 97 mL/min (by C-G formula based on SCr of 1.1 mg/dL).   Medical History: Past Medical History:  Diagnosis Date   Chronic lymphocytic leukemia (CLL), B-cell (HCC)    Small Cell Lymphoma   Coronary artery disease, non-occlusive 02/2017   Cardiac cath in setting of MI: 50 and 55% bifurcation LAD-Diag1   Enlarged lymph node    left neck   Hypertension    STEMI (ST elevation myocardial infarction) (HCC) 03/05/2017   hx/notes 03/05/2017 -likely aborted anterior STEMI with 50% bifurcation LAD-Diag1.  No PCI.  Preserved EF   Syncope due to orthostatic hypotension 04/21/2018    Medications:  Infusions:   heparin      Assessment: 67 yo male with new onset afib.  Pharmacy asked to start IV heparin per discussion with Dr. Jacques Navy, while awaiting plans for biopsy.  Received 1 dose of lovenox 120 mg (8/1 @ 2308).  Heparin level is slightly subtherapeutic at 0.26 this am on heparin drip 1900 uts/hr  and heparin drip increased to 2100 uts/hr.  Heparin level drawn later in am but heparin had  been turned off for biopsy in IR.   Resume heparin drip uts/hr 2h after biopsy and recheck heparin level in am.  No s/sx of bleeding, cbc stable  When confirmed procedures complete can resume oral AC.   Goal of Therapy:  Heparin level 0.3-0.7 units/ml Monitor platelets by anticoagulation protocol: Yes   Plan:  Resume heparin at 2100 units/hr. Daily heparin level and CBC. Monitor for signs/sx of bleeding  Thank you for allowing pharmacy to participate in this patient's care,  Sherron Monday, PharmD, BCCCP Clinical Pharmacist  Phone: [phone] 02/17/2023 1:51 PM  Please check AMION for all [facility] phone numbers After 10:00 PM, call Main Pharmacy [phone]  ADDENDUM Heparin level came back therapeutic at 0.32, on heparin infusion at 1900 units after changed to alternative IV site. No s/sx of bleeding or infusion issues. Given previous levels have been close to goal range will continue same rate and get level with AM labs.    Leota Sauers Pharm.D. CPP, BCPS Clinical Pharmacist [phone] 02/17/2023 1:54 PM     Please check AMION for all  [facility] phone numbers After 10:00 PM, call Main Pharmacy [phone]

## 2023-02-17 NOTE — Progress Notes (Signed)
Progress Note  Patient Name: Adam Reyes Date of Encounter: 02/17/2023 Primary Cardiologist: Bryan Lemma, MD   Subjective   Overnight no further AF. Patient notes near syncope when he was in AF. NPO for biopsy; planned today per patient/wife.  Vital Signs    Vitals:   02/16/23 1930 02/16/23 2339 02/17/23 0545 02/17/23 0714  BP: 134/69 108/69 (!) 109/59 120/62  Pulse:      Resp: 20 14 15 18   Temp: 98.5 F (36.9 C) 98.1 F (36.7 C) 98.2 F (36.8 C) 98.2 F (36.8 C)  TempSrc: Oral Oral Oral Oral  SpO2: 99% 99% 99% 99%  Weight:   125.7 kg   Height:        Intake/Output Summary (Last 24 hours) at 02/17/2023 0907 Last data filed at 02/17/2023 0800 Gross per 24 hour  Intake 1556.18 ml  Output 725 ml  Net 831.18 ml   Filed Weights   02/15/23 0500 02/16/23 0500 02/17/23 0545  Weight: 122.8 kg 124.8 kg 125.7 kg    Physical Exam   GEN: No acute distress.   Neck: No JVD, Homero Fellers Sign noted Cardiac: RRR, no murmurs at rest, no rubs, or gallops.  Respiratory: Clear to auscultation bilaterally. GI: Soft, nontender, non-distended  MS: No edema Integument: cool distal legs  Labs   Telemetry: SR   Chemistry Recent Labs  Lab 02/13/23 0402 02/14/23 0126 02/15/23 0547 02/16/23 0201 02/17/23 0055  NA 134* 136 135 136 134*  K 3.0* 3.5 3.3* 3.9 3.6  CL 98 103 103 103 100  CO2 23 22 22  21* 22  GLUCOSE 91 101* 106* 99 96  BUN 28* 30* 29* 23 25*  CREATININE 1.15 1.36* 1.20 1.14 1.10  CALCIUM 8.8* 8.6* 8.8* 8.8* 8.8*  PROT 5.3* 4.9* 5.0*  --   --   ALBUMIN 3.1* 2.7* 2.8* 2.8* 2.7*  AST 41 45* 44*  --   --   ALT 44 45* 45*  --   --   ALKPHOS 96 89 107  --   --   BILITOT 2.6* 2.6* 2.5*  --   --   GFRNONAA >60 58* >60 >60 >60  ANIONGAP 13 11 10 12 12      Hematology Recent Labs  Lab 02/15/23 0547 02/16/23 0201 02/17/23 0055  WBC 10.5 13.9* 17.9*  RBC 3.94* 3.94* 3.93*  HGB 12.6* 12.6* 13.0  HCT 37.6* 37.9* 38.2*  MCV 95.4 96.2 97.2  MCH 32.0 32.0 33.1   MCHC 33.5 33.2 34.0  RDW 14.6 14.8 15.0  PLT 151 139* 128*    Cardiac EnzymesNo results for input(s): "TROPONINI" in the last 168 hours. No results for input(s): "TROPIPOC" in the last 168 hours.   BNPNo results for input(s): "BNP", "PROBNP" in the last 168 hours.   DDimer No results for input(s): "DDIMER" in the last 168 hours.   Cardiac Studies   Cardiac Studies & Procedures   CARDIAC CATHETERIZATION  CARDIAC CATHETERIZATION 03/05/2017  Narrative Images from the original result were not included.   Prox LAD lesion, 50 %stenosed bifurcation lesion with Ost 1st Diag lesion, 55 %stenosed.  The left ventricular systolic function is normal. The left ventricular ejection fraction is 50-55% by visual estimate.  LV end diastolic pressure is mildly elevated.  Patient had prolonged episodes of chest pain with positive troponin and concern EKG for STEMI involving the lateral wall and possibly inferior wall. Likely culprit for his MI is a diagonal lesion. However upon evaluation angiographically, the lesion is not flow-limiting  appeared to be overtly thrombotic in nature.  At this point I think the best course of action would be to treat medically with IV heparin along with aspirin and Plavix and avoid potential bifurcation PCI in the relatively stable patient who is now chest pain-free.  Plan:  Admit to telemetry unit/stepdown for IV heparin for minimum of 48 hours.  TR band removal per protocol  Would treat at least 3 months aspirin and Plavix.  Add statin, and consider beta blocker.   Expected she may for discharge in 2-3 days.   Bryan Lemma, MD Bryan Lemma, M.D., M.S. Interventional Cardiologist  Pager # [phone] Phone # [phone] [address] [place], [place] [address]  Findings Coronary Findings Diagnostic  Dominance: Right  Left Main Vessel is large.  Left Anterior Descending The lesion is located at the major branch, eccentric and  irregular.  First Diagonal Branch The lesion is focal and irregular.  First Septal Branch Vessel is moderate in size.  Second Diagonal Branch Vessel is small in size.  Left Circumflex The lesion is tubular and smooth.  Lateral First Obtuse Marginal Branch Vessel is small in size.  Lateral Second Obtuse Marginal Branch Vessel is small in size.  Right Coronary Artery Vessel is large.  Acute Marginal Branch Vessel is moderate in size.  Inferior Septal Vessel is small in size.  Right Posterior Atrioventricular Artery Vessel is small in size.  Intervention  No interventions have been documented.   STRESS TESTS  EXERCISE TOLERANCE TEST (ETT) 05/28/2017  Narrative  Patient walked on a Bruce protocol treadmill test for 9 minutes and 25 seconds. He achieved a peak heart rate of 164 which is 103% of predicted maximal heart rate.  The blood pressure response to exercise was normal.  There were no ST or T wave changes to suggest ischemia.  Negative GXT.   ECHOCARDIOGRAM  ECHOCARDIOGRAM COMPLETE 02/14/2023  Narrative ECHOCARDIOGRAM REPORT    Patient Name:   Adam Reyes Date of Exam: 02/14/2023 Medical Rec #:  [id number]     Height:       76.0 in Accession #:    [id number]    Weight:       269.2 lb Date of Birth:  [birth year]      BSA:          2.513 m Patient Age:    67 years      BP:           97/52 mmHg Patient Gender: M             HR:           85 bpm. Exam Location:  Inpatient  Procedure: 2D Echo, Cardiac Doppler, Color Doppler and Intracardiac Opacification Agent  Indications:    Atrial fibrillation  History:        Patient has prior history of Echocardiogram examinations, most recent 07/24/2021. CAD and Previous Myocardial Infarction; Risk Factors:Hypertension. Lymphoma.  Sonographer:    Milda Smart Referring Phys: [id number] Boyce Medici GONFA   Sonographer Comments: Image acquisition challenging due to patient body habitus and Image acquisition  challenging due to respiratory motion. IMPRESSIONS   1. Left ventricular ejection fraction, by estimation, is 70 to 75%. The left ventricle has hyperdynamic function. The left ventricle has no regional wall motion abnormalities. There is mild asymmetric left ventricular hypertrophy of the basal-septal segment. Peak LV outflow tract gradient 94 mmHg. Left ventricular diastolic parameters are consistent with Grade II diastolic dysfunction (pseudonormalization). 2. Right ventricular  systolic function is normal. The right ventricular size is normal. Tricuspid regurgitation signal is inadequate for assessing PA pressure. 3. Left atrial size was mildly dilated. 4. There is mitral valve systolic anterior motion. The mitral valve is abnormal. Trivial mitral valve regurgitation. No evidence of mitral stenosis. 5. The aortic valve is tricuspid. Aortic valve regurgitation is mild to moderate. No aortic stenosis is present. 6. Aortic dilatation noted. There is mild dilatation of the ascending aorta, measuring 40 mm. 7. The inferior vena cava is normal in size with greater than 50% respiratory variability, suggesting right atrial pressure of 3 mmHg. 8. Vigorous LV systolic function with LVOT gradient and SAM. Patient has HOCM physiology.  FINDINGS Left Ventricle: Left ventricular ejection fraction, by estimation, is 70 to 75%. The left ventricle has hyperdynamic function. The left ventricle has no regional wall motion abnormalities. Definity contrast agent was given IV to delineate the left ventricular endocardial borders. The left ventricular internal cavity size was normal in size. There is mild asymmetric left ventricular hypertrophy of the basal-septal segment. Left ventricular diastolic parameters are consistent with Grade II diastolic dysfunction (pseudonormalization).  Right Ventricle: The right ventricular size is normal. No increase in right ventricular wall thickness. Right ventricular systolic  function is normal. Tricuspid regurgitation signal is inadequate for assessing PA pressure.  Left Atrium: Left atrial size was mildly dilated.  Right Atrium: Right atrial size was normal in size.  Pericardium: There is no evidence of pericardial effusion.  Mitral Valve: There is mitral valve systolic anterior motion. The mitral valve is abnormal. Trivial mitral valve regurgitation. No evidence of mitral valve stenosis. MV peak gradient, 6.0 mmHg. The mean mitral valve gradient is 3.0 mmHg.  Tricuspid Valve: The tricuspid valve is normal in structure. Tricuspid valve regurgitation is trivial.  Aortic Valve: The aortic valve is tricuspid. Aortic valve regurgitation is mild to moderate. Aortic regurgitation PHT measures 450 msec. No aortic stenosis is present. Aortic valve mean gradient measures 48.0 mmHg. Aortic valve peak gradient measures 93.7 mmHg.  Pulmonic Valve: The pulmonic valve was normal in structure. Pulmonic valve regurgitation is trivial.  Aorta: The aortic root is normal in size and structure and aortic dilatation noted. There is mild dilatation of the ascending aorta, measuring 40 mm.  Venous: The inferior vena cava is normal in size with greater than 50% respiratory variability, suggesting right atrial pressure of 3 mmHg.  IAS/Shunts: No atrial level shunt detected by color flow Doppler.   LEFT VENTRICLE PLAX 2D LVIDd:         5.50 cm      Diastology LVIDs:         3.10 cm      LV e' medial:    6.96 cm/s LV PW:         1.00 cm      LV E/e' medial:  10.6 LV IVS:        0.80 cm      LV e' lateral:   8.16 cm/s LVOT diam:     2.40 cm      LV E/e' lateral: 9.1 LVOT Area:     4.52 cm  LV Volumes (MOD) LV vol d, MOD A2C: 135.0 ml LV vol d, MOD A4C: 130.0 ml LV vol s, MOD A2C: 33.0 ml LV vol s, MOD A4C: 38.4 ml LV SV MOD A2C:     102.0 ml LV SV MOD A4C:     130.0 ml LV SV MOD BP:      97.9 ml  RIGHT VENTRICLE RV Basal diam:  2.60 cm RV S prime:     21.10 cm/s TAPSE  (M-mode): 2.5 cm  LEFT ATRIUM             Index        RIGHT ATRIUM           Index LA diam:        3.60 cm 1.43 cm/m   RA Area:     14.20 cm LA Vol (A2C):   83.1 ml 33.06 ml/m  RA Volume:   31.00 ml  12.33 ml/m LA Vol (A4C):   84.0 ml 33.42 ml/m LA Biplane Vol: 90.8 ml 36.13 ml/m AORTIC VALVE AV Vmax:      484.00 cm/s AV Vmean:     329.000 cm/s AV VTI:       0.756 m AV Peak Grad: 93.7 mmHg AV Mean Grad: 48.0 mmHg AI PHT:       450 msec  AORTA Ao Root diam: 3.60 cm Ao Asc diam:  4.00 cm  MITRAL VALVE MV Peak grad: 6.0 mmHg     SHUNTS MV Mean grad: 3.0 mmHg     Systemic Diam: 2.40 cm MV Vmax:      1.22 m/s MV Vmean:     81.3 cm/s MR Peak grad: 96.0 mmHg MR Vmax:      490.00 cm/s MV E velocity: 73.90 cm/s MV A velocity: 89.90 cm/s MV E/A ratio:  0.82  Dalton McleanMD Electronically signed by Wilfred Lacy Signature Date/Time: 02/14/2023/11:17:54 AM    Final    MONITORS  LONG TERM MONITOR (3-14 DAYS) 10/02/2020  Narrative  Predominant underlying rhythm-Sinus Rhythm:  2 Runs of Ventricular Tachycardia (VT): Fastest 6 beats-138 bpm, longest 8 beats-121 bpm.  To clarify, with the reported DVT episodes was actually more consistent with PAT with aberrancy.  13 runs of Supraventricular Tachycardia (SVT)-actually Paroxysmal Atrial Tachycardia (PAT): fastest 9 beats-280 bpm, longest-134 bpm for 14 seconds.  SVT/PAT was noted with symptomatic event.  Rare isolated PACs (premature atrial contractions)-some triplets and couplets noted.  Occasional (4.6%) PVCs (future ventricular contractions) with rare couplets and triplets. There was some bigeminy (every other beat) and trigeminy (every third beat) s.   Patch Wear Time:  6 days and 21 hours (2022-03-04T20:44:50-0500 to 2022-03-11T18:43:28-0500)  Notable events: 2 short bursts of VT-benign; 13 short bursts PSVT/PAT-benign.  4.6% PVCs-moderate concern, more concerning on greater than 10%.  -> PT/PAT with aberrancy  episodes not triggering the symptoms.  Symptoms were noted with sinus rhythm intermittent premature ventricular contractions.  Several of SVT episodes were also noted.  With this level of extra heartbeats, we probably could consider trying to treat with medicines.  Need to discuss side effects. Thankfully, no dangerous rhythms noted.  Although short blood pressures are pretty benign.  Bryan Lemma, MD                Assessment & Plan   New onset A-fib with RVR:  - s/p emergent DCCV 02/13/23 for symptomatic hypotension - CHA2DS2-VASc 3.  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:  - on cardiotoxic chemotherapy (arrhythmogenic) as above; continue amiodarone while on this therapy, biopsy pending today  CAD/hyperlipidemia:  - non obstructive - DC ASA at DC - continue state unless worsening LFTs  Hyperdynamic LV function with LVOT gradient - no evidence of HCM, when stable repeat limited echo is planned, if he is planned for DC at that time, will plan outpatient limited echo    Hypotension:  - planned to DC off of metoprolol and telmisartan, if BP increases may transition to carvedilol 6.25 mg PO BID, given history of SVT and ectopy     Acute Kidney Injury: - resolves with hydration, improved with IVF - as per primary   Elevated liver Enzymes/mild hyperbilirubinemia:  -Not 3X normal, statin has been continued   Normocytic anemia/thrombocytopenia/splenomegaly:  - stable 120-151 - continue AC as above     For questions or updates, please contact [facility] Please consult [email or web address] for contact info under Cardiology/STEMI.      Riley Lam, MD FASE [facility] Cardiologist [facility]  [address] [place],  [address] [place], [place] [address] [phone]  9:07 AM

## 2023-02-18 ENCOUNTER — Other Ambulatory Visit (HOSPITAL_COMMUNITY): Payer: Self-pay

## 2023-02-18 ENCOUNTER — Encounter (HOSPITAL_COMMUNITY): Payer: Self-pay | Admitting: Hematology

## 2023-02-18 ENCOUNTER — Other Ambulatory Visit: Payer: Self-pay | Admitting: Cardiology

## 2023-02-18 DIAGNOSIS — I4891 Unspecified atrial fibrillation: Secondary | ICD-10-CM | POA: Diagnosis not present

## 2023-02-18 DIAGNOSIS — R7989 Other specified abnormal findings of blood chemistry: Secondary | ICD-10-CM | POA: Diagnosis not present

## 2023-02-18 DIAGNOSIS — I428 Other cardiomyopathies: Secondary | ICD-10-CM

## 2023-02-18 DIAGNOSIS — I9589 Other hypotension: Secondary | ICD-10-CM | POA: Diagnosis not present

## 2023-02-18 DIAGNOSIS — N179 Acute kidney failure, unspecified: Secondary | ICD-10-CM | POA: Diagnosis not present

## 2023-02-18 DIAGNOSIS — E871 Hypo-osmolality and hyponatremia: Secondary | ICD-10-CM | POA: Diagnosis not present

## 2023-02-18 DIAGNOSIS — I25119 Atherosclerotic heart disease of native coronary artery with unspecified angina pectoris: Secondary | ICD-10-CM | POA: Diagnosis not present

## 2023-02-18 DIAGNOSIS — C9110 Chronic lymphocytic leukemia of B-cell type not having achieved remission: Secondary | ICD-10-CM | POA: Diagnosis not present

## 2023-02-18 LAB — SURGICAL PATHOLOGY

## 2023-02-18 MED ORDER — AMIODARONE HCL 200 MG PO TABS
ORAL_TABLET | ORAL | 0 refills | Status: DC
  Filled 2023-02-18: qty 111, 90d supply, fill #0

## 2023-02-18 MED ORDER — APIXABAN 5 MG PO TABS
5.0000 mg | ORAL_TABLET | Freq: Two times a day (BID) | ORAL | 2 refills | Status: DC
  Filled 2023-02-18: qty 60, 30d supply, fill #0

## 2023-02-18 MED ORDER — APIXABAN 5 MG PO TABS
5.0000 mg | ORAL_TABLET | Freq: Two times a day (BID) | ORAL | Status: DC
  Administered 2023-02-18: 5 mg via ORAL
  Filled 2023-02-18: qty 1

## 2023-02-18 MED ORDER — SENNOSIDES-DOCUSATE SODIUM 8.6-50 MG PO TABS: 1.0000 | ORAL_TABLET | Freq: Two times a day (BID) | ORAL | Status: DC | PRN

## 2023-02-18 MED ORDER — OXYCODONE HCL 5 MG PO TABS
5.0000 mg | ORAL_TABLET | Freq: Four times a day (QID) | ORAL | 0 refills | Status: AC | PRN
  Filled 2023-02-18: qty 28, 7d supply, fill #0

## 2023-02-18 NOTE — Progress Notes (Signed)
Progress Note  Patient Name: Adam Reyes Date of Encounter: 02/18/2023 Primary Cardiologist: Bryan Lemma, MD   Subjective   S/p biopsy.  No further AF.  Has done well  Vital Signs    Vitals:   02/17/23 2328 02/18/23 0518 02/18/23 0732 02/18/23 0733  BP:  124/61 128/60   Pulse: 85 78 75   Resp:   16 19  Temp: 97.9 F (36.6 C) 98.3 F (36.8 C) 98 F (36.7 C)   TempSrc: Oral Oral Oral   SpO2: 96% 92%  94%  Weight:  126.1 kg    Height:        Intake/Output Summary (Last 24 hours) at 02/18/2023 0804 Last data filed at 02/18/2023 0527 Gross per 24 hour  Intake 762.79 ml  Output 1150 ml  Net -387.21 ml   Filed Weights   02/16/23 0500 02/17/23 0545 02/18/23 0518  Weight: 124.8 kg 125.7 kg 126.1 kg    Physical Exam   GEN: No acute distress.   Neck: No JVD, Homero Fellers Sign noted Cardiac: RRR, no murmurs at rest, Systolic murmur no rubs, or gallops.  Respiratory: Clear to auscultation bilaterally. GI: Soft, nontender, non-distended  MS: No edema Integument: cool distal legs  Labs   Telemetry: SR   Chemistry Recent Labs  Lab 02/14/23 0126 02/15/23 0547 02/16/23 0201 02/17/23 0055 02/18/23 0337  NA 136 135 136 134* 132*  K 3.5 3.3* 3.9 3.6 4.5  CL 103 103 103 100 100  CO2 22 22 21* 22 23  GLUCOSE 101* 106* 99 96 87  BUN 30* 29* 23 25* 32*  CREATININE 1.36* 1.20 1.14 1.10 1.26*  CALCIUM 8.6* 8.8* 8.8* 8.8* 8.7*  PROT 4.9* 5.0*  --   --  4.8*  ALBUMIN 2.7* 2.8* 2.8* 2.7* 2.6*  AST 45* 44*  --   --  54*  ALT 45* 45*  --   --  40  ALKPHOS 89 107  --   --  110  BILITOT 2.6* 2.5*  --   --  1.9*  GFRNONAA 58* >60 >60 >60 >60  ANIONGAP 11 10 12 12 9      Hematology Recent Labs  Lab 02/16/23 0201 02/17/23 0055 02/18/23 0337  WBC 13.9* 17.9* 20.6*  RBC 3.94* 3.93* 3.93*  HGB 12.6* 13.0 12.8*  HCT 37.9* 38.2* 38.8*  MCV 96.2 97.2 98.7  MCH 32.0 33.1 32.6  MCHC 33.2 34.0 33.0  RDW 14.8 15.0 15.5  PLT 139* 128* 115*    Cardiac EnzymesNo results for  input(s): "TROPONINI" in the last 168 hours. No results for input(s): "TROPIPOC" in the last 168 hours.   BNPNo results for input(s): "BNP", "PROBNP" in the last 168 hours.   DDimer No results for input(s): "DDIMER" in the last 168 hours.   Cardiac Studies   Cardiac Studies & Procedures   CARDIAC CATHETERIZATION  CARDIAC CATHETERIZATION 03/05/2017  Narrative Images from the original result were not included.   Prox LAD lesion, 50 %stenosed bifurcation lesion with Ost 1st Diag lesion, 55 %stenosed.  The left ventricular systolic function is normal. The left ventricular ejection fraction is 50-55% by visual estimate.  LV end diastolic pressure is mildly elevated.  Patient had prolonged episodes of chest pain with positive troponin and concern EKG for STEMI involving the lateral wall and possibly inferior wall. Likely culprit for his MI is a diagonal lesion. However upon evaluation angiographically, the lesion is not flow-limiting appeared to be overtly thrombotic in nature.  At this point I  think the best course of action would be to treat medically with IV heparin along with aspirin and Plavix and avoid potential bifurcation PCI in the relatively stable patient who is now chest pain-free.  Plan:  Admit to telemetry unit/stepdown for IV heparin for minimum of 48 hours.  TR band removal per protocol  Would treat at least 3 months aspirin and Plavix.  Add statin, and consider beta blocker.   Expected she may for discharge in 2-3 days.   Bryan Lemma, MD Bryan Lemma, M.D., M.S. Interventional Cardiologist  Pager # [phone] Phone # [phone] [address] [place], [place] [address]  Findings Coronary Findings Diagnostic  Dominance: Right  Left Main Vessel is large.  Left Anterior Descending The lesion is located at the major branch, eccentric and irregular.  First Diagonal Branch The lesion is focal and irregular.  First Septal  Branch Vessel is moderate in size.  Second Diagonal Branch Vessel is small in size.  Left Circumflex The lesion is tubular and smooth.  Lateral First Obtuse Marginal Branch Vessel is small in size.  Lateral Second Obtuse Marginal Branch Vessel is small in size.  Right Coronary Artery Vessel is large.  Acute Marginal Branch Vessel is moderate in size.  Inferior Septal Vessel is small in size.  Right Posterior Atrioventricular Artery Vessel is small in size.  Intervention  No interventions have been documented.   STRESS TESTS  EXERCISE TOLERANCE TEST (ETT) 05/28/2017  Narrative  Patient walked on a Bruce protocol treadmill test for 9 minutes and 25 seconds. He achieved a peak heart rate of 164 which is 103% of predicted maximal heart rate.  The blood pressure response to exercise was normal.  There were no ST or T wave changes to suggest ischemia.  Negative GXT.   ECHOCARDIOGRAM  ECHOCARDIOGRAM COMPLETE 02/14/2023  Narrative ECHOCARDIOGRAM REPORT    Patient Name:   Adam Reyes Date of Exam: 02/14/2023 Medical Rec #:  [id number]     Height:       76.0 in Accession #:    [id number]    Weight:       269.2 lb Date of Birth:  1956/03/28      BSA:          2.513 m Patient Age:    67 years      BP:           97/52 mmHg Patient Gender: M             HR:           85 bpm. Exam Location:  Inpatient  Procedure: 2D Echo, Cardiac Doppler, Color Doppler and Intracardiac Opacification Agent  Indications:    Atrial fibrillation  History:        Patient has prior history of Echocardiogram examinations, most recent 07/24/2021. CAD and Previous Myocardial Infarction; Risk Factors:Hypertension. Lymphoma.  Sonographer:    Milda Smart Referring Phys: [id number] Boyce Medici GONFA   Sonographer Comments: Image acquisition challenging due to patient body habitus and Image acquisition challenging due to respiratory motion. IMPRESSIONS   1. Left ventricular ejection fraction,  by estimation, is 70 to 75%. The left ventricle has hyperdynamic function. The left ventricle has no regional wall motion abnormalities. There is mild asymmetric left ventricular hypertrophy of the basal-septal segment. Peak LV outflow tract gradient 94 mmHg. Left ventricular diastolic parameters are consistent with Grade II diastolic dysfunction (pseudonormalization). 2. Right ventricular systolic function is normal. The right ventricular size is normal. Tricuspid regurgitation  signal is inadequate for assessing PA pressure. 3. Left atrial size was mildly dilated. 4. There is mitral valve systolic anterior motion. The mitral valve is abnormal. Trivial mitral valve regurgitation. No evidence of mitral stenosis. 5. The aortic valve is tricuspid. Aortic valve regurgitation is mild to moderate. No aortic stenosis is present. 6. Aortic dilatation noted. There is mild dilatation of the ascending aorta, measuring 40 mm. 7. The inferior vena cava is normal in size with greater than 50% respiratory variability, suggesting right atrial pressure of 3 mmHg. 8. Vigorous LV systolic function with LVOT gradient and SAM. Patient has HOCM physiology.  FINDINGS Left Ventricle: Left ventricular ejection fraction, by estimation, is 70 to 75%. The left ventricle has hyperdynamic function. The left ventricle has no regional wall motion abnormalities. Definity contrast agent was given IV to delineate the left ventricular endocardial borders. The left ventricular internal cavity size was normal in size. There is mild asymmetric left ventricular hypertrophy of the basal-septal segment. Left ventricular diastolic parameters are consistent with Grade II diastolic dysfunction (pseudonormalization).  Right Ventricle: The right ventricular size is normal. No increase in right ventricular wall thickness. Right ventricular systolic function is normal. Tricuspid regurgitation signal is inadequate for assessing PA pressure.  Left  Atrium: Left atrial size was mildly dilated.  Right Atrium: Right atrial size was normal in size.  Pericardium: There is no evidence of pericardial effusion.  Mitral Valve: There is mitral valve systolic anterior motion. The mitral valve is abnormal. Trivial mitral valve regurgitation. No evidence of mitral valve stenosis. MV peak gradient, 6.0 mmHg. The mean mitral valve gradient is 3.0 mmHg.  Tricuspid Valve: The tricuspid valve is normal in structure. Tricuspid valve regurgitation is trivial.  Aortic Valve: The aortic valve is tricuspid. Aortic valve regurgitation is mild to moderate. Aortic regurgitation PHT measures 450 msec. No aortic stenosis is present. Aortic valve mean gradient measures 48.0 mmHg. Aortic valve peak gradient measures 93.7 mmHg.  Pulmonic Valve: The pulmonic valve was normal in structure. Pulmonic valve regurgitation is trivial.  Aorta: The aortic root is normal in size and structure and aortic dilatation noted. There is mild dilatation of the ascending aorta, measuring 40 mm.  Venous: The inferior vena cava is normal in size with greater than 50% respiratory variability, suggesting right atrial pressure of 3 mmHg.  IAS/Shunts: No atrial level shunt detected by color flow Doppler.   LEFT VENTRICLE PLAX 2D LVIDd:         5.50 cm      Diastology LVIDs:         3.10 cm      LV e' medial:    6.96 cm/s LV PW:         1.00 cm      LV E/e' medial:  10.6 LV IVS:        0.80 cm      LV e' lateral:   8.16 cm/s LVOT diam:     2.40 cm      LV E/e' lateral: 9.1 LVOT Area:     4.52 cm  LV Volumes (MOD) LV vol d, MOD A2C: 135.0 ml LV vol d, MOD A4C: 130.0 ml LV vol s, MOD A2C: 33.0 ml LV vol s, MOD A4C: 38.4 ml LV SV MOD A2C:     102.0 ml LV SV MOD A4C:     130.0 ml LV SV MOD BP:      97.9 ml  RIGHT VENTRICLE RV Basal diam:  2.60 cm RV S prime:  21.10 cm/s TAPSE (M-mode): 2.5 cm  LEFT ATRIUM             Index        RIGHT ATRIUM           Index LA diam:         3.60 cm 1.43 cm/m   RA Area:     14.20 cm LA Vol (A2C):   83.1 ml 33.06 ml/m  RA Volume:   31.00 ml  12.33 ml/m LA Vol (A4C):   84.0 ml 33.42 ml/m LA Biplane Vol: 90.8 ml 36.13 ml/m AORTIC VALVE AV Vmax:      484.00 cm/s AV Vmean:     329.000 cm/s AV VTI:       0.756 m AV Peak Grad: 93.7 mmHg AV Mean Grad: 48.0 mmHg AI PHT:       450 msec  AORTA Ao Root diam: 3.60 cm Ao Asc diam:  4.00 cm  MITRAL VALVE MV Peak grad: 6.0 mmHg     SHUNTS MV Mean grad: 3.0 mmHg     Systemic Diam: 2.40 cm MV Vmax:      1.22 m/s MV Vmean:     81.3 cm/s MR Peak grad: 96.0 mmHg MR Vmax:      490.00 cm/s MV E velocity: 73.90 cm/s MV A velocity: 89.90 cm/s MV E/A ratio:  0.82  Dalton McleanMD Electronically signed by Wilfred Lacy Signature Date/Time: 02/14/2023/11:17:54 AM    Final    MONITORS  LONG TERM MONITOR (3-14 DAYS) 10/02/2020  Narrative  Predominant underlying rhythm-Sinus Rhythm:  2 Runs of Ventricular Tachycardia (VT): Fastest 6 beats-138 bpm, longest 8 beats-121 bpm.  To clarify, with the reported DVT episodes was actually more consistent with PAT with aberrancy.  13 runs of Supraventricular Tachycardia (SVT)-actually Paroxysmal Atrial Tachycardia (PAT): fastest 9 beats-280 bpm, longest-134 bpm for 14 seconds.  SVT/PAT was noted with symptomatic event.  Rare isolated PACs (premature atrial contractions)-some triplets and couplets noted.  Occasional (4.6%) PVCs (future ventricular contractions) with rare couplets and triplets. There was some bigeminy (every other beat) and trigeminy (every third beat) s.   Patch Wear Time:  6 days and 21 hours (2022-03-04T20:44:50-0500 to 2022-03-11T18:43:28-0500)  Notable events: 2 short bursts of VT-benign; 13 short bursts PSVT/PAT-benign.  4.6% PVCs-moderate concern, more concerning on greater than 10%.  -> PT/PAT with aberrancy episodes not triggering the symptoms.  Symptoms were noted with sinus rhythm intermittent  premature ventricular contractions.  Several of SVT episodes were also noted.  With this level of extra heartbeats, we probably could consider trying to treat with medicines.  Need to discuss side effects. Thankfully, no dangerous rhythms noted.  Although short blood pressures are pretty benign.  Bryan Lemma, MD                Assessment & Plan   New onset A-fib with RVR:  - s/p emergent DCCV 02/13/23 for symptomatic hypotension - CHA2DS2-VASc 3.  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:  - on cardiotoxic chemotherapy (arrhythmogenic) as above  CAD/hyperlipidemia:  - non obstructive - DC ASA at DC  - continue statin unless worsening LFTs  Hyperdynamic LV function with LVOT gradient - no evidence of HCM, when stable repeat limited echo is planned, if he is planned for  DC at that time, will plan outpatient limited echo as outpatient   Hypotension:  - planned to DC off of metoprolol and telmisartan at discharge, if BP increases may transition to carvedilol 6.25 mg PO BID, given history of SVT and ectopy (resolved on amiodarone and s/p DCCV)     Acute Kidney Injury: - resolves with hydration, improved with IVF - as per primary   Elevated liver Enzymes/mild hyperbilirubinemia:  -Not 3X normal, statin has been continued   Normocytic anemia/thrombocytopenia/splenomegaly:  - stable 120-151 - continue AC as above   Tentative Planned for DC today Will work to get echo and f/u scheduled  For questions or updates, please contact [facility] Please consult [email or web address] for contact info under Cardiology/STEMI.      Riley Lam, MD FASE [facility] Cardiologist [facility]  [address] [place], [address] [place], [place] [address] [phone]  8:04 AM

## 2023-02-18 NOTE — Progress Notes (Signed)
Discharged home accompanied by wife , Belongings taken by wife.

## 2023-02-18 NOTE — Discharge Summary (Signed)
Physician Discharge Summary  Adam Reyes ZOX:[id number] DOB: 05-16-1956 DOA: 02/12/2023  PCP: Anabel Halon, MD  Admit date: 02/12/2023 Discharge date: 02/18/2023 Admitted From: Home Disposition: Home Recommendations for Outpatient Follow-up:  Follow up with PCP in 1 week Oncology to arrange outpatient follow-up Cardiology to arrange outpatient follow-up and repeat echo in 1 to 2 weeks Check CBC and CMP in 1 week Please follow up on the following pending results: Bone biopsy result  Home Health: None Equipment/Devices: Rolling walker and shower chair  Discharge Condition: Stable CODE STATUS: Full code  Follow-up Information     Anabel Halon, MD. Schedule an appointment as soon as possible for a visit in 1 week(s).   Specialty: Internal Medicine Why: Aug. 14,2024 @ 9AM Contact information: [address] 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  Reportedly had poor p.o. intake, fatigue, diaphoresis, dizziness and myalgia before he went to cancer center.  There was concern about Richter's transformation of his malignancy.  His oncologist, Dr. Ellin Saba recommended transfer to [facility] for bone biopsy.    Hospital course complicated by new onset A-fib with RVR for which she was initially started on Cardizem drip but became hypotensive.  Cardiology consulted.  He was switched to IV amiodarone and underwent DCCV the night of 8/1-2 and converted to sinus rhythm.  Received IV amiodarone and heparin in-house.  TTE with hyperdynamic LV function with LVOT  obstruction but no HCM.  Discharged on p.o. amiodarone 400 mg twice daily for 1 week followed by 200 mg daily, and Eliquis for anticoagulation.  Cardiology recommends repeat echo in 1 to 2 weeks.  They will arrange outpatient follow-up.   Patient improved clinically.  Blood cultures NGTD.  Urine culture with multiple species.  Completed 3 days of IV ceftriaxone empirically.   Bone biopsy today, 8/5.  Likely discharge home on 8/6.  See individual problem list below for more.   Problems addressed during this hospitalization Principal Problem:   Hypotension Active Problems:   Lymphoma, small lymphocytic (HCC)   Question of Richter's transformation   AKI (acute kidney injury) (HCC)   Chronic lymphocytic leukemia (CLL), B-cell (HCC)   Coronary artery disease involving native coronary artery of native heart with angina pectoris (HCC)   Dyslipidemia, goal LDL below 70   Frequent PVCs   Poor appetite   Low energy   Elevated lactic acid level   Dehydration   Hyponatremia   Elevated liver enzymes   Hyperglycemia   Atrial fibrillation with RVR (HCC)   Hemodynamic instability   Acute Kidney Injury: Suspect prerenal in the setting of poor p.o. intake and hypotension.  Also on Micardis at home.  AKI resolved.  Renal function is stable off IV fluid. -Discontinued Micardis at discharge. -Repeat renal function in 1 week   New onset A-fib with RVR: Felt to be iatrogenic from chemotherapy that he completed.  Initially started on Cardizem drip but became hypotensive.  Successful DCCV the evening of 8/1.  CHA2DS2-VASc score at least 3.  TTE with hyperdynamic LV function with LVOT obstruction but no HCM TSH within normal. -Appreciate cardiology input-recommends  uninterrupted anticoagulation at least for 30 days after DCCV -Discharge on p.o. amiodarone and Eliquis per cardiology.   Hyperdynamic LV function with LVOT obstruction, likely secondary to overall illness, no definite HCM.  -Repeat limited  TTE in 1-2 weeks pending clinical course.  -Avoid hypotension.  My card is discontinued on discharge   Hypotension: Likely due to dehydration and concurrent use of antihypertensive meds. TTE with hyperdynamic LV function with LVOT obstruction but no HCM. TSH and cortisol within normal.  Resolved.   Generalized weakness: Likely due to the above.  Improved. -Following walker and shower chair ordered as recommended by therapy   Possible urinary tract infection: Some dysuria that he attributes to lying in bed.  No fever or chills.  UA with nitrites.  Urine culture with multiple species.  Immunocompromised patient.  Leukocytosis likely from lymphoma. -Empirically treated with IV ceftriaxone for 3 days.   CLL with small lymphocytic lymphoma/cancer-related left leg and groin pain: Followed by Dr. Ellin Saba.  Was out of ibrutinib for 4 days prior to presentation.  Concern about Richter's transformation. Transferred to [facility] for bone biopsy.  Pain improved with adjustment of pain medications. -CT-guided bone biopsy performed on 8/5.  Pathology pending. -Outpatient follow-up with oncology -Tramadol and oxycodone for pain control.  Recommended using Senokot-S for constipation   Dehydration: Resolved.  Encourage good hydration.   Hypokalemia: Resolved.   Lactic Acidosis: Likely due to hypotension and dehydration.  Resolved.   CAD/dyslipidemia: No anginal symptoms.  TTE as above. -Continue Lipitor and aspirin.   Hyponatremia: Resolved.   Elevated liver Enzymes/mild hyperbilirubinemia: Mild.  Possibly from ibrutinib.  No significant hepatobiliary finding on CT. HIV and acute hepatitis panel negative. -Monitor.   Normocytic anemia/thrombocytopenia/splenomegaly: Likely from malignancy.  Stable. -Monitor   Leukocytosis: Likely from underlying lymphoma.  Doubt active infection. -Recheck at follow-up.   Obesity Body mass index is 33.84 kg/m.            Time spent 45 minutes  Vital  signs Vitals:   02/17/23 2328 02/18/23 0518 02/18/23 0732 02/18/23 0733  BP:  124/61 128/60   Pulse: 85 78 75   Temp: 97.9 F (36.6 C) 98.3 F (36.8 C) 98 F (36.7 C)   Resp:   16 19  Height:      Weight:  126.1 kg    SpO2: 96% Comment: portable 02 monitor 92% Comment: portable 02 monitor  94%  TempSrc: Oral Oral Oral   BMI (Calculated):  33.85       Discharge exam  GENERAL: No apparent distress.  Nontoxic. HEENT: MMM.  Vision and hearing grossly intact.  NECK: Supple.  No apparent JVD.  RESP:  No IWOB.  Fair aeration bilaterally. CVS:  RRR. Heart sounds normal.  ABD/GI/GU: BS+. Abd soft, NTND.  MSK/EXT:  Moves extremities. No apparent deformity. No edema.  SKIN: no apparent skin lesion or wound NEURO: Awake and alert. Oriented appropriately.  No apparent focal neuro deficit. PSYCH: Calm. Normal affect.   Discharge Instructions Discharge Instructions     Diet general   Complete by: As directed    Discharge instructions   Complete by: As directed    It has been a pleasure taking care of you!  You were hospitalized due to weakness, dehydration and atrial fibrillation (irregular and fast heart rate).  Your symptoms improved with treatment.  We have started you on medication for your atrial fibrillation.  You had bone biopsy for bladder cancer.  Your oncologist will follow-up on the results  and let you know the plan.  Please review your new medication list and the directions on your medications before you take them.  Maintain good hydration.  Avoid any over-the-counter pain cold and flu medications. Cautiously take tramadol for moderate pain and oxycodone for severe pain.  Note that both tramadol and oxycodone can cause confusion, constipation and affect your breathing.   Follow-up with your primary care doctor 1 to 2 weeks or sooner if needed. Cardiology will arrange outpatient follow-up.   Take care,   Increase activity slowly   Complete by: As directed    No wound  care   Complete by: As directed       Allergies as of 02/18/2023   No Known Allergies      Medication List     STOP taking these medications    aspirin 81 MG chewable tablet   metoprolol tartrate 25 MG tablet Commonly known as: LOPRESSOR   telmisartan 20 MG tablet Commonly known as: MICARDIS       TAKE these medications    amiodarone 200 MG tablet Commonly known as: PACERONE Take 2 tablets (400 mg total) by mouth 2 (two) times daily for 7 days, THEN 1 tablet (200 mg total) daily. Start taking on: February 18, 2023   atorvastatin 40 MG tablet Commonly known as: LIPITOR TAKE ONE (1) TABLET BY MOUTH EVERY DAY What changed: additional instructions   cholecalciferol 25 MCG (1000 UNIT) tablet Commonly known as: VITAMIN D3 Take 10,000 Units by mouth daily.   Eliquis 5 MG Tabs tablet Generic drug: apixaban Take 1 tablet (5 mg total) by mouth 2 (two) times daily.   ferrous sulfate 324 (65 Fe) MG Tbec Take 324 mg by mouth every other day.   oxyCODONE 5 MG immediate release tablet Commonly known as: Oxy IR/ROXICODONE Take 1 tablet (5 mg total) by mouth every 6 (six) hours as needed for up to 7 days for severe pain.   senna-docusate 8.6-50 MG tablet Commonly known as: Senokot-S Take 1 tablet by mouth 2 (two) times daily between meals as needed for mild constipation.   traMADol 50 MG tablet Commonly known as: ULTRAM Take 1 tablet (50 mg total) by mouth every 6 (six) hours as needed.               Durable Medical Equipment  (From admission, onward)           Start     Ordered   02/18/23 1145  For home use only DME Walker rolling  Once       Question Answer Comment  Walker: With 5 Inch Wheels   Patient needs a walker to treat with the following condition Weakness      02/18/23 1145   02/18/23 1103  For home use only DME Shower stool  Once        02/18/23 1102            Consultations: Cardiology Interventional  radiology  Procedures/Studies: 8/1-DCCV for atrial fibrillation 8/5-CT-guided bone biopsy   US BIOPSY (ABDOMINAL RETROPERTIONEAL MASS)  Result Date: 02/17/2023 INDICATION: 67 year old male with diffuse lymphadenopathy, history of small cell lymphocytic lymphoma. EXAM: Ultrasound-guided lymph node biopsy MEDICATIONS: None. ANESTHESIA/SEDATION: Moderate (conscious) sedation was employed during this procedure. A total of Versed 1 mg and Fentanyl 50 mcg was administered intravenously by the radiology nurse. Total intra-service moderate Sedation Time: 4 minutes. The patient's level of consciousness and vital signs were monitored continuously by radiology nursing throughout the procedure under my direct  supervision. COMPLICATIONS: None immediate. PROCEDURE: Informed written consent was obtained from the patient after a thorough discussion of the procedural risks, benefits and alternatives. All questions were addressed. Maximal Sterile Barrier Technique was utilized including caps, mask, sterile gowns, sterile gloves, sterile drape, hand hygiene and skin antiseptic. A timeout was performed prior to the initiation of the procedure. Preprocedure ultrasound evaluation demonstrated multifocal prominent, superficial left iliac lymph nodes. Procedure was planned. Subdermal Local anesthesia was provided 1% lidocaine. Deeper local anesthetic was administered to the periphery of the lymph node under direct ultrasound visualization. A 17 gauge Coaxial introducer needle was directed to the periphery of the targeted lymph node. A total of 3, 18 gauge core biopsies were then obtained and placed in saline. The coaxial needle was removed. Hemostasis was achieved with brief manual compression. Postprocedure ultrasound evaluation demonstrated no evidence of surrounding hematoma or other complicating features. The patient tolerated the procedure well was transferred to the recovery area in good condition. IMPRESSION: Technically  successful ultrasound-guided left iliac lymph node biopsy. Marliss Coots, MD [facility] Electronically Signed   By: Marliss Coots M.D.   On: 02/17/2023 13:25   ECHOCARDIOGRAM COMPLETE  Result Date: 02/14/2023    ECHOCARDIOGRAM REPORT   Patient Name:   DYLANGER FUENTEZ Date of Exam: 02/14/2023 Medical Rec #:  [id number]     Height:       76.0 in Accession #:    [id number]    Weight:       269.2 lb Date of Birth:  [birth year]      BSA:          2.513 m Patient Age:    67 years      BP:           97/52 mmHg Patient Gender: M             HR:           85 bpm. Exam Location:  Inpatient Procedure: 2D Echo, Cardiac Doppler, Color Doppler and Intracardiac            Opacification Agent Indications:    Atrial fibrillation  History:        Patient has prior history of Echocardiogram examinations, most                 recent 07/24/2021. CAD and Previous Myocardial Infarction; Risk                 Factors:Hypertension. Lymphoma.  Sonographer:    Milda Smart Referring Phys: [id number] Boyce Medici   Sonographer Comments: Image acquisition challenging due to patient body habitus and Image acquisition challenging due to respiratory motion. IMPRESSIONS  1. Left ventricular ejection fraction, by estimation, is 70 to 75%. The left ventricle has hyperdynamic function. The left ventricle has no regional wall motion abnormalities. There is mild asymmetric left ventricular hypertrophy of the basal-septal segment. Peak LV outflow tract gradient 94 mmHg. Left ventricular diastolic parameters are consistent with Grade II diastolic dysfunction (pseudonormalization).  2. Right ventricular systolic function is normal. The right ventricular size is normal. Tricuspid regurgitation signal is inadequate for assessing PA pressure.  3. Left atrial size was mildly dilated.  4. There is mitral valve systolic anterior motion. The mitral valve is abnormal. Trivial mitral valve regurgitation. No  evidence of mitral stenosis.  5. The aortic valve is tricuspid. Aortic valve regurgitation is mild to moderate. No aortic stenosis is present.  6. Aortic dilatation noted. There is  mild dilatation of the ascending aorta, measuring 40 mm.  7. The inferior vena cava is normal in size with greater than 50% respiratory variability, suggesting right atrial pressure of 3 mmHg.  8. Vigorous LV systolic function with LVOT gradient and SAM. Patient has HOCM physiology. FINDINGS  Left Ventricle: Left ventricular ejection fraction, by estimation, is 70 to 75%. The left ventricle has hyperdynamic function. The left ventricle has no regional wall motion abnormalities. Definity contrast agent was given IV to delineate the left ventricular endocardial borders. The left ventricular internal cavity size was normal in size. There is mild asymmetric left ventricular hypertrophy of the basal-septal segment. Left ventricular diastolic parameters are consistent with Grade II diastolic  dysfunction (pseudonormalization). Right Ventricle: The right ventricular size is normal. No increase in right ventricular wall thickness. Right ventricular systolic function is normal. Tricuspid regurgitation signal is inadequate for assessing PA pressure. Left Atrium: Left atrial size was mildly dilated. Right Atrium: Right atrial size was normal in size. Pericardium: There is no evidence of pericardial effusion. Mitral Valve: There is mitral valve systolic anterior motion. The mitral valve is abnormal. Trivial mitral valve regurgitation. No evidence of mitral valve stenosis. MV peak gradient, 6.0 mmHg. The mean mitral valve gradient is 3.0 mmHg. Tricuspid Valve: The tricuspid valve is normal in structure. Tricuspid valve regurgitation is trivial. Aortic Valve: The aortic valve is tricuspid. Aortic valve regurgitation is mild to moderate. Aortic regurgitation PHT measures 450 msec. No aortic stenosis is present. Aortic valve mean gradient measures 48.0  mmHg. Aortic valve peak gradient measures 93.7 mmHg. Pulmonic Valve: The pulmonic valve was normal in structure. Pulmonic valve regurgitation is trivial. Aorta: The aortic root is normal in size and structure and aortic dilatation noted. There is mild dilatation of the ascending aorta, measuring 40 mm. Venous: The inferior vena cava is normal in size with greater than 50% respiratory variability, suggesting right atrial pressure of 3 mmHg. IAS/Shunts: No atrial level shunt detected by color flow Doppler.  LEFT VENTRICLE PLAX 2D LVIDd:         5.50 cm      Diastology LVIDs:         3.10 cm      LV e' medial:    6.96 cm/s LV PW:         1.00 cm      LV E/e' medial:  10.6 LV IVS:        0.80 cm      LV e' lateral:   8.16 cm/s LVOT diam:     2.40 cm      LV E/e' lateral: 9.1 LVOT Area:     4.52 cm  LV Volumes (MOD) LV vol d, MOD A2C: 135.0 ml LV vol d, MOD A4C: 130.0 ml LV vol s, MOD A2C: 33.0 ml LV vol s, MOD A4C: 38.4 ml LV SV MOD A2C:     102.0 ml LV SV MOD A4C:     130.0 ml LV SV MOD BP:      97.9 ml RIGHT VENTRICLE RV Basal diam:  2.60 cm RV S prime:     21.10 cm/s TAPSE (M-mode): 2.5 cm LEFT ATRIUM             Index        RIGHT ATRIUM           Index LA diam:        3.60 cm 1.43 cm/m   RA Area:     14.20 cm LA Vol (  A2C):   83.1 ml 33.06 ml/m  RA Volume:   31.00 ml  12.33 ml/m LA Vol (A4C):   84.0 ml 33.42 ml/m LA Biplane Vol: 90.8 ml 36.13 ml/m  AORTIC VALVE AV Vmax:      484.00 cm/s AV Vmean:     329.000 cm/s AV VTI:       0.756 m AV Peak Grad: 93.7 mmHg AV Mean Grad: 48.0 mmHg AI PHT:       450 msec  AORTA Ao Root diam: 3.60 cm Ao Asc diam:  4.00 cm MITRAL VALVE MV Peak grad: 6.0 mmHg     SHUNTS MV Mean grad: 3.0 mmHg     Systemic Diam: 2.40 cm MV Vmax:      1.22 m/s MV Vmean:     81.3 cm/s MR Peak grad: 96.0 mmHg MR Vmax:      490.00 cm/s MV E velocity: 73.90 cm/s MV A velocity: 89.90 cm/s MV E/A ratio:  0.82 Dalton McleanMD Electronically signed by Wilfred Lacy Signature Date/Time: 02/14/2023/11:17:54  AM    Final    CT ABDOMEN LIMITED WO CONTRAST  Result Date: 02/13/2023 CLINICAL DATA:  Retroperitoneal biopsy, procedure deferred due to onset of AFib * Tracking Code: BO * EXAM: CT ABDOMEN WITHOUT CONTRAST LIMITED TECHNIQUE: Multidetector CT imaging of the abdomen was performed following the standard protocol without IV contrast. RADIATION DOSE REDUCTION: This exam was performed according to the departmental dose-optimization program which includes automated exposure control, adjustment of the mA and/or kV according to patient size and/or use of iterative reconstruction technique. COMPARISON:  CT abdomen pelvis, 02/12/2023 FINDINGS: Hepatobiliary: No solid liver abnormality is seen. No gallstones, gallbladder wall thickening, or biliary dilatation. Pancreas: Unremarkable. No pancreatic ductal dilatation or surrounding inflammatory changes. Spleen: Gross splenomegaly, maximum span 20.3 cm. Adrenals/Urinary Tract: Adrenal glands are unremarkable. Simple, benign left renal cortical cysts, for which no further follow-up or characterization is required kidneys are otherwise normal, without renal calculi, solid lesion, or hydronephrosis. Stomach/Bowel: Stomach is within normal limits. No evidence of bowel wall thickening, distention, or inflammatory changes. Descending colonic diverticulosis. Vascular/Lymphatic: Aortic atherosclerosis. Unchanged bulky retrocrural, retroperitoneal, and bilateral iliac lymphadenopathy, with numerous additional prominent lymph nodes throughout the bowel mesentery. Other: No abdominal wall hernia or abnormality. No ascites. Musculoskeletal: No acute or significant osseous findings. IMPRESSION: 1. Unchanged bulky retrocrural, retroperitoneal, and bilateral iliac lymphadenopathy, with numerous additional prominent lymph nodes throughout the bowel mesentery. 2. Gross splenomegaly, maximum span 20.3 cm. 3. Findings are unchanged in comparison to prior day's examination and most consistent  with lymphoma. No acute findings. 4. Descending colonic diverticulosis without evidence of acute diverticulitis. Aortic Atherosclerosis (ICD10-I70.0). Electronically Signed   By: Jearld Lesch M.D.   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 Right lower quadrant abdominal pain. 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 * Tracking Code: BO * EXAM: CT ABDOMEN AND PELVIS WITH CONTRAST TECHNIQUE: Multidetector CT imaging of the abdomen and pelvis was performed using the standard protocol following bolus administration of intravenous contrast. RADIATION DOSE REDUCTION: This exam was performed according to the departmental dose-optimization program which includes automated exposure control, adjustment of the mA and/or kV according to patient size and/or use of iterative reconstruction technique. CONTRAST:  OMNIPAQUE IOHEXOL 300 MG/ML  SOLN COMPARISON:  PET-CT 02/06/2023. CT of the chest, abdomen and pelvis 01/24/2023 and 04/11/2022. FINDINGS: Lower chest: Stable linear atelectasis or scarring at both  lung bases. Small pulmonary nodules bilaterally are unchanged from recent prior imaging, measuring up to 8 mm in the right lower lobe and 9 mm in the left lower lobe, both on image 9/5. There is progressive subcarinal and distal paraesophageal adenopathy as seen on recent PET-CT. A distal right paraesophageal node measures up to 5.4 x 4.6 cm on image 14/2. Hepatobiliary: The liver is normal in density without suspicious focal abnormality. No evidence of gallstones, gallbladder wall thickening or biliary dilatation. Pancreas: Unremarkable. No pancreatic ductal dilatation or surrounding inflammatory changes. Spleen: Progressive splenomegaly compared with recent CT. No focal abnormality identified. Adrenals/Urinary Tract: Both adrenal glands appear normal. No evidence of urinary tract calculus,  suspicious renal lesion or hydronephrosis. Unchanged cyst in the mid left kidney for which no follow-up imaging is recommended. Stable bladder compression by the bilateral pelvic lymphadenopathy. No intrinsic bladder abnormality identified. Stomach/Bowel: No enteric contrast administered. The stomach appears unremarkable for its degree of distension. No evidence of bowel wall thickening, distention or surrounding inflammatory change. Vascular/Lymphatic: Again demonstrated is extensive retroperitoneal, pelvic and bilateral inguinal adenopathy. No significant change is seen from the recent PET-CT, although some of the lymph nodes have enlarged from the diagnostic CT done 01/24/2023. For example, there is a left external iliac node measuring 4.5 cm short axis on image 86/2 which previously measured 3.7 cm. A presacral node measuring 3.1 cm on image 72/2 previously measured 2.3 cm. No nodal necrosis identified. Mild aortic and branch vessel atherosclerosis without evidence of aneurysm or large vessel occlusion. Reproductive: The prostate gland appears mildly enlarged, but stable. Other: No evidence of abdominal wall mass or hernia. No ascites or pneumoperitoneum. Musculoskeletal: No acute or significant osseous findings. There is multilevel spondylosis with a grade 1 anterolisthesis at L5-S1 secondary to underlying pars defects. Associated prominent foraminal narrowing in the lower lumbar spine, unchanged. Unless specific follow-up recommendations are mentioned in the findings or impression sections, no imaging follow-up of any mentioned incidental findings is recommended. IMPRESSION: 1. Known extensive adenopathy in the lower chest, abdomen and pelvis has mildly progressed compared with the CTs of less than 3 weeks earlier consistent with progressive lymphoma. 2. Progressive splenomegaly. 3. Stable small pulmonary nodules at both lung bases. 4. No definite acute findings. 5. Stable lumbar spondylosis with grade 1  anterolisthesis at L5-S1 secondary to underlying pars defects. Associated prominent foraminal narrowing in the lower lumbar spine. 6.  Aortic Atherosclerosis (ICD10-I70.0). Electronically Signed   By: Carey Bullocks M.D.   On: 02/12/2023 12:16   DG Chest Port 1 View  Result Date: 02/12/2023 CLINICAL DATA:  Sepsis.  History of lymphoma EXAM: PORTABLE CHEST 1 VIEW COMPARISON:  PET-CT 02/06/2023 FINDINGS: Subtle bandlike opacity right lung base. Atelectasis is favored. No consolidation, pneumothorax or effusion. No edema. Normal cardiopericardial silhouette. Overlapping cardiac leads. IMPRESSION: Right basilar atelectasis.  No pneumothorax or effusion. Electronically Signed   By: Karen Kays M.D.   On: 02/12/2023 11:29   NM PET Image Restag (PS) Skull Base To Thigh  Result Date: 02/12/2023 CLINICAL DATA:  Subsequent treatment strategy for small lymphocytic lymphoma. EXAM: NUCLEAR MEDICINE PET SKULL BASE TO THIGH TECHNIQUE: 13.3 mCi F-18 FDG was injected intravenously. Full-ring PET imaging was performed from the skull base to thigh after the radiotracer. CT data was obtained and used for attenuation correction and anatomic localization. Fasting blood glucose: 105 mg/dl COMPARISON:  CT 8/75/64 FINDINGS: Mediastinal blood pool activity: SUV max 2.51 Liver activity: SUV max 3.29 NECK: Bilateral cervical adenopathy is identified with corresponding  mild increased uptake. -right level 2 node measures 1.6 cm with SUV max of 2.75. -Left level 3 lymph node measures 1.3 cm with SUV max of 2.69, image 59/202. Incidental CT findings: None. CHEST: Left superior mediastinal prevascular lymph node measures 1.7 cm with SUV max of 3.93, image 86/202. Multiple tracer avid posterior mediastinal lymph nodes are identified. -aorto esophageal lymph node measures 1.8 cm with SUV max of 3.99. -Right posterior mediastinal node just above the GE junction measures 3.2 cm with SUV max of 4.71, image 148/202. -Left lower posterior  mediastinal node measures 3.4 cm with SUV max 4.59, image 148/202. Bilateral retropectoral adenopathy is identified which exhibits mild tracer uptake. -Right retropectoral lymph node measures 1.5 cm with SUV max of 1.82, image 90/202. -Left retropectoral lymph node measures 1.4 cm with SUV max of 1.98, image 94/202. Small bilateral lung nodules are again noted. -Index nodule within the anterior left upper lobe measures 7 mm with SUV max of 0.89, image 88/202. -1 cm nodule within the basilar right upper lobe has an SUV max of 0.71, image 98/202. Incidental CT findings: Aortic atherosclerosis and coronary artery calcifications. ABDOMEN/PELVIS: There is extensive tracer avid abdominopelvic adenopathy. -left retrocrural lymph node measures 2.4 cm with SUV max of 5.19, image 165/202. -Left retroperitoneal nodal conglomeration measures 9.2 x 6.5 cm with SUV max of 10.76, image 200/202. -Right common iliac lymph node measures 1.9 cm with SUV max of 4.65, image 238/202 -Left inguinal lymph node measures 1.7 cm with SUV max of 3.07, image 286/202. -Large nodal conglomeration within the left external iliac chain measures 11.3 x 6.9 cm with an SUV max 8.89, image 260/202. Diffuse increased uptake within the spleen has an SUV max 3.67. The spleen is enlarged measuring 18.09 cm in cranial caudal dimension. No abnormal tracer uptake identified within the liver, pancreas, or adrenal glands. Incidental CT findings: Left kidney cyst measures 6.3 cm, image 190/202. Aortic atherosclerotic calcifications. SKELETON: No focal hypermetabolic activity to suggest skeletal metastasis. Incidental CT findings: None. IMPRESSION: 1. Extensive tracer avid adenopathy within the chest, abdomen, and pelvis compatible with lymphoma. Deauville criteria 4/5. 2. Splenomegaly with diffuse increased uptake concerning for splenic involvement. Deauville criteria 4 3. Bilateral lung nodules are not significantly tracer avid. However these lung nodules are  too small to reliably characterize by PET-CT. 4. Aortic Atherosclerosis (ICD10-I70.0). Electronically Signed   By: Signa Kell M.D.   On: 02/12/2023 09:46   US Venous Img Lower Unilateral Left  Result Date: 02/04/2023 CLINICAL DATA:  Swelling left leg EXAM: Left LOWER EXTREMITY VENOUS DOPPLER ULTRASOUND TECHNIQUE: Gray-scale sonography with compression, as well as color and duplex ultrasound, were performed to evaluate the deep venous system(s) from the level of the common femoral vein through the popliteal and proximal calf veins. COMPARISON:  None Available. FINDINGS: VENOUS Normal compressibility of the common femoral, superficial femoral, and popliteal veins, as well as the visualized calf veins. Visualized portions of profunda femoral vein and great saphenous vein unremarkable. No filling defects to suggest DVT on grayscale or color Doppler imaging. Doppler waveforms show normal direction of venous flow, normal respiratory plasticity and response to augmentation. Limited views of the contralateral common femoral vein are unremarkable. OTHER Of note there is some slow flow in the left popliteal vein, nonspecific. This vein also is distended. Limitations: none IMPRESSION: No evidence of left lower extremity DVT. Nonspecific finding of distended left popliteal vein with slow flow Electronically Signed   By: Karen Kays M.D.   On: 02/04/2023 10:15  CT SOFT TISSUE NECK W CONTRAST  Result Date: 01/27/2023 CLINICAL DATA:  Lymphoma, ongoing chemo. EXAM: CT NECK WITH CONTRAST TECHNIQUE: Multidetector CT imaging of the neck was performed using the standard protocol following the bolus administration of intravenous contrast. RADIATION DOSE REDUCTION: This exam was performed according to the departmental dose-optimization program which includes automated exposure control, adjustment of the mA and/or kV according to patient size and/or use of iterative reconstruction technique. CONTRAST:  OMNIPAQUE  IOHEXOL 300 MG/ML  SOLN COMPARISON:  CT neck 04/11/2022 FINDINGS: Pharynx and larynx: The nasal cavity and nasopharynx are unremarkable. The oral cavity and oropharynx are unremarkable. The parapharyngeal spaces are clear. The hypopharynx and larynx are unremarkable. The vocal folds are normal in appearance. The epiglottis is normal. There is no retropharyngeal collection. The airway is patent. Salivary glands: The parotid and submandibular glands are unremarkable. Thyroid: Unremarkable. Lymph nodes: Multiple enlarged lymph nodes are again seen: *The 2.2 cm short axis right level IB lymph node is increased in bulk in the coronal plane (4-43, 2-57). *1.8 cm short axis right level IIA lymph node is slightly increased in size from 1.4 cm (2-48). *1.5 cm short axis right level IIB lymph node more posteriorly is increased in size from 1.1 cm (2-41). *Multiple level III and IV lymph nodes are also overall slightly increased in size. *1.0 cm left level IIA lymph node is slightly increased in size from 0.9 cm (2-49). *The 1.4 cm short axis left level IIA/III node is increased in size from 1.0 cm (2-64). 1.3 cm left level III node is similar in size (2-69). 1.2 cm left level IV node is stable to slightly increased in size (2-81). *Partially imaged bilateral subpectoral lymphadenopathy appears increased. Vascular: The major vasculature of the neck is unremarkable. Limited intracranial: Unremarkable. Visualized orbits: Bilateral lens implants are in place. The imaged globes and orbits are otherwise unremarkable. Mastoids and visualized paranasal sinuses: There is mild mucosal thickening in the left maxillary sinus. The mastoid air cells and middle ear cavities are clear. There is probable cerumen in both external auditory canals. Skeleton: There is no acute osseous abnormality or suspicious osseous lesion. Upper chest: Reported separately. Other: The previously seen calcified inclusion cyst in the left neck is decreased in  size. IMPRESSION: Since the CT neck from 04/11/2022, bilateral cervical lymphadenopathy has worsened, concerning for disease progression. Electronically Signed   By: Lesia Hausen M.D.   On: 01/27/2023 10:34   CT CHEST ABDOMEN PELVIS W CONTRAST  Result Date: 01/24/2023 CLINICAL DATA:  History of lymphoma, monitor. * Tracking Code: BO *. EXAM: CT CHEST, ABDOMEN, AND PELVIS WITH CONTRAST TECHNIQUE: Multidetector CT imaging of the chest, abdomen and pelvis was performed following the standard protocol during bolus administration of intravenous contrast. RADIATION DOSE REDUCTION: This exam was performed according to the departmental dose-optimization program which includes automated exposure control, adjustment of the mA and/or kV according to patient size and/or use of iterative reconstruction technique. CONTRAST:  OMNIPAQUE IOHEXOL 300 MG/ML  SOLN COMPARISON:  Multiple priors including most recent CT April 11, 2022. FINDINGS: CT CHEST FINDINGS Cardiovascular: Normal caliber thoracic aorta. No central pulmonary embolus on this nondedicated study. Coronary artery calcifications. Normal size heart. No significant pericardial effusion/thickening. Mediastinum/Nodes: Increased size of thoracic inlet, mediastinal, axillary and subpectoral lymph nodes. For reference: -thoracic inlet lymph node measures 15 mm in short axis on image 11/2 previously 7 mm. -right subpectoral lymph node measures 13 mm in short axis on image 15/2, previously 6 mm. -paraesophageal  lymph node measures 16 mm in short axis on image 25/2 previously 5 mm. No suspicious thyroid nodule. The esophagus is grossly unremarkable. Lungs/Pleura: Increased size of some of the scattered bilateral pulmonary nodules with other stable in size from prior. For reference: -right upper lobe perifissural pulmonary nodule measures 10 mm on image 84/3 previously 7 mm -right upper lobe perifissural pulmonary nodule measures 10 mm on image 88/3 previously 5 mm.  -left lower lobe subpleural pulmonary nodule measures 6 mm on image 112/3 previously 4 mm. -right lower lobe pulmonary nodule measures 7 mm on image 108/3, unchanged. No pleural effusion.  No pneumothorax. Musculoskeletal: No aggressive lytic or blastic lesion of bone. CT ABDOMEN PELVIS FINDINGS Hepatobiliary: No suspicious hepatic lesion. Gallbladder is unremarkable. No biliary ductal dilation. Pancreas: No pancreatic ductal dilation or evidence of acute inflammation. Spleen: Splenomegaly measuring 18.4 cm in maximum axial dimension. Adrenals/Urinary Tract: Bilateral adrenal glands appear normal. Fluid signal 6.7 cm left upper pole renal cyst. Kidneys demonstrate symmetric enhancement. Urinary bladder is unremarkable for degree of distension. Stomach/Bowel: No pathologic dilation of small or large bowel. Colonic diverticulosis without findings of acute diverticulitis. No evidence of acute bowel inflammation. Vascular/Lymphatic: Increased size and number of the retrocrural, retroperitoneal, right upper quadrant, left-greater-than-right iliac side chain and inguinal lymph nodes. For reference: -left retrocrural lymph node measures 2.2 cm in short axis on image 59/2 previously 1.6 cm -left periaortic lymph node conglomerate measures 6.5 cm in short axis on image 81/2 -left external iliac lymph node measures 3.6 cm in short axis on image 121/2 previously 1.7 cm. Aortic atherosclerosis. Effacement of the abdominal aorta by retroperitoneal adenopathy. The portal, splenic and superior mesenteric veins are patent. Reproductive: Prostate gland is unremarkable. Other: Small fat containing right inguinal hernia. Musculoskeletal: Chronic bilateral L5 pars defects. No aggressive lytic or blastic lesion of bone. Multilevel degenerative change of the spine. IMPRESSION: 1. Increased adenopathy above and below the diaphragm with new splenomegaly compatible with worsening lymphomatous disease. 2. Increased size of some of the  scattered bilateral pulmonary nodules, suggestive of lymphomatous disease involvement. 3.  Aortic Atherosclerosis (ICD10-I70.0). These results will be called to the ordering clinician or representative by the Radiologist Assistant, and communication documented in the PACS or [employer]. Electronically Signed   By: Maudry Mayhew M.D.   On: 01/24/2023 15:57       The results of significant diagnostics from this hospitalization (including imaging, microbiology, ancillary and laboratory) are listed below for reference.     Microbiology: Recent Results (from the past 240 hour(s))  Blood Culture (routine x 2)     Status: None   Collection Time: 02/12/23 10:54 AM   Specimen: BLOOD  Result Value Ref Range Status   Specimen Description BLOOD BLOOD RIGHT HAND  Final   Special Requests   Final    BOTTLES DRAWN AEROBIC AND ANAEROBIC Blood Culture results may not be optimal due to an excessive volume of blood received in culture bottles   Culture   Final    NO GROWTH 5 DAYS Performed at [facility], [address], [place], [place] [address]    Report Status 02/17/2023 FINAL  Final  Blood Culture (routine x 2)     Status: None   Collection Time: 02/12/23 10:54 AM   Specimen: BLOOD  Result Value Ref Range Status   Specimen Description BLOOD BLOOD LEFT ARM  Final   Special Requests   Final    BOTTLES DRAWN AEROBIC AND ANAEROBIC Blood Culture adequate volume  Culture   Final    NO GROWTH 5 DAYS Performed at [facility], [address] [place], [place] [address]    Report Status 02/17/2023 FINAL  Final  Urine Culture     Status: Abnormal   Collection Time: 02/12/23  8:23 PM   Specimen: Urine, Random  Result Value Ref Range Status   Specimen Description   Final    URINE, RANDOM Performed at [facility], [address] [place], [place] [address]    Special Requests   Final    NONE Reflexed from [id number] Performed at [facility], [address] [place], [place] [address]    Culture  MULTIPLE SPECIES PRESENT, SUGGEST RECOLLECTION (A)  Final   Report Status 02/14/2023 FINAL  Final  MRSA Next Gen by PCR, Nasal     Status: None   Collection Time: 02/13/23  4:40 PM   Specimen: Nasal Mucosa; Nasal Swab  Result Value Ref Range Status   MRSA by PCR Next Gen NOT DETECTED NOT DETECTED Final    Comment: (NOTE) The GeneXpert MRSA Assay (FDA approved for NASAL specimens only), is one component of a comprehensive MRSA colonization surveillance program. It is not intended to diagnose MRSA infection nor to guide or monitor treatment for MRSA infections. Test performance is not FDA approved in patients less than 66 years old. Performed at [facility], [address] [place], [place] [address]      Labs:  CBC: Recent Labs  Lab 02/12/23 0919 02/12/23 1054 02/13/23 0402 02/14/23 0126 02/15/23 0547 02/16/23 0201 02/17/23 0055 02/18/23 0337  WBC 7.6 7.5   < > 8.3 10.5 13.9* 17.9* 20.6*  NEUTROABS 5.1 5.7  --   --   --   --   --  13.6*  HGB 14.0 12.8*   < > 12.3* 12.6* 12.6* 13.0 12.8*  HCT 41.9 37.9*   < > 36.9* 37.6* 37.9* 38.2* 38.8*  MCV 97.4 96.9   < > 95.6 95.4 96.2 97.2 98.7  PLT 157 149*   < > 146* 151 139* 128* 115*   < > = values in this interval not displayed.   BMP &GFR Recent Labs  Lab 02/14/23 0126 02/15/23 0547 02/16/23 0201 02/17/23 0055 02/18/23 0337  NA 136 135 136 134* 132*  K 3.5 3.3* 3.9 3.6 4.5  CL 103 103 103 100 100  CO2 22 22 21* 22 23  GLUCOSE 101* 106* 99 96 87  BUN 30* 29* 23 25* 32*  CREATININE 1.36* 1.20 1.14 1.10 1.26*  CALCIUM 8.6* 8.8* 8.8* 8.8* 8.7*  MG 2.0 2.0 2.0 2.2 2.5*  PHOS  --  2.7 2.8 3.7  --    Estimated Creatinine Clearance: 84.7 mL/min (A) (by C-G formula based on SCr of 1.26 mg/dL (H)). Liver & Pancreas: Recent Labs  Lab 02/12/23 1054 02/13/23 0402 02/14/23 0126 02/15/23 0547 02/16/23 0201 02/17/23 0055 02/18/23 0337  AST 45* 41 45* 44*  --   --  54*  ALT 45* 44 45* 45*  --   --  40  ALKPHOS 96  96 89 107  --   --  110  BILITOT 3.7* 2.6* 2.6* 2.5*  --   --  1.9*  PROT 5.8* 5.3* 4.9* 5.0*  --   --  4.8*  ALBUMIN 3.4* 3.1* 2.7* 2.8* 2.8* 2.7* 2.6*   No results for input(s): "LIPASE", "AMYLASE" in the last 168 hours. No results for input(s): "AMMONIA" in the last 168 hours. Diabetic: No results for  input(s): "HGBA1C" in the last 72 hours. Recent Labs  Lab 02/13/23 2226  GLUCAP 94   Cardiac Enzymes: No results for input(s): "CKTOTAL", "CKMB", "CKMBINDEX", "TROPONINI" in the last 168 hours. No results for input(s): "PROBNP" in the last 8760 hours. Coagulation Profile: Recent Labs  Lab 02/12/23 1054 02/17/23 1038  INR 1.2 1.1   Thyroid Function Tests: No results for input(s): "TSH", "T4TOTAL", "FREET4", "T3FREE", "THYROIDAB" in the last 72 hours. Lipid Profile: No results for input(s): "CHOL", "HDL", "LDLCALC", "TRIG", "CHOLHDL", "LDLDIRECT" in the last 72 hours. Anemia Panel: No results for input(s): "VITAMINB12", "FOLATE", "FERRITIN", "TIBC", "IRON", "RETICCTPCT" in the last 72 hours. Urine analysis:    Component Value Date/Time   COLORURINE AMBER (A) 02/12/2023 2023   APPEARANCEUR HAZY (A) 02/12/2023 2023   LABSPEC 1.044 (H) 02/12/2023 2023   PHURINE 6.0 02/12/2023 2023   GLUCOSEU NEGATIVE 02/12/2023 2023   HGBUR MODERATE (A) 02/12/2023 2023   BILIRUBINUR SMALL (A) 02/12/2023 2023   KETONESUR NEGATIVE 02/12/2023 2023   PROTEINUR 100 (A) 02/12/2023 2023   NITRITE POSITIVE (A) 02/12/2023 2023   LEUKOCYTESUR NEGATIVE 02/12/2023 2023   Sepsis Labs: Invalid input(s): "PROCALCITONIN", "LACTICIDVEN"   SIGNED:  Almon Hercules, MD  [facility] 02/18/2023, 2:29 PM

## 2023-02-18 NOTE — Progress Notes (Signed)
Mobility Specialist Progress Note:   02/18/23 1015  Mobility  Activity Ambulated with assistance in hallway  Level of Assistance Standby assist, set-up cues, supervision of patient - no hands on  Assistive Device Front wheel walker  Distance Ambulated (ft) 160 ft  Activity Response Tolerated well  Mobility Referral Yes  $Mobility charge 1 Mobility  Mobility Specialist Start Time (ACUTE ONLY) 1005  Mobility Specialist Stop Time (ACUTE ONLY) 1015  Mobility Specialist Time Calculation (min) (ACUTE ONLY) 10 min    Pt received in bed, agreeable to mobility. C/o slight LLE pain during ambulation, otherwise asymptomatic throughout. Pt left in chair with call bell near and all needs met. Family present.   Adam Reyes  Mobility Specialist Please contact via [employer] office at [phone]

## 2023-02-18 NOTE — TOC Initial Note (Addendum)
Transition of Care (TOC) - Initial/Assessment Note   Patient needing tall Rollator and a shower seat   Jermaine with [employer] does not carry tall Rolator  Zachary with [employer] does not carry tall Rolator either   [employer] has different brands of Rolators that "might work" for someone his height. He would need to go to [facility] and try them , they would then submit to insurance, process takes 45 minutes plus .   Patient asking if [place] carries same. Called on extended hold . They have one private pay 227.00. Patient wife and OT aware . OT recommending regular rolling walker . Patient and wife in agreement. Secure chatted MD for order .   Earna Coder with [employer] will deliver shower chair and rolling walker as soon as order signed  Patient Details  Name: Adam Reyes MRN: [id number] Date of Birth: 21-May-1956  Transition of Care [facility]) CM/SW Contact:    Kingsley Plan, RN Phone Number: 02/18/2023, 11:02 AM  Clinical Narrative:                   Expected Discharge Plan: Home/Self Care     Patient Goals and CMS Choice   CMS Medicare.gov Compare Post Acute Care list provided to:: Patient Choice offered to / list presented to : Patient [place] ownership interest in [facility].provided to:: Patient    Expected Discharge Plan and Services     Post Acute Care Choice: Durable Medical Equipment Living arrangements for the past 2 months: Single Family Home Expected Discharge Date: 02/18/23                 DME Agency: NA       HH Arranged: NA          Prior Living Arrangements/Services Living arrangements for the past 2 months: Single Family Home Lives with:: Spouse Patient language and need for interpreter reviewed:: Yes Do you feel safe going back to the place where you live?: Yes      Need for Family Participation in Patient Care: Yes (Comment) Care giver support system in place?: Yes (comment) Current home services: DME Criminal  Activity/Legal Involvement Pertinent to Current Situation/Hospitalization: No - Comment as needed  Activities of Daily Living Home Assistive Devices/Equipment: None ADL Screening (condition at time of admission) Patient's cognitive ability adequate to safely complete daily activities?: Yes Is the patient deaf or have difficulty hearing?: No Does the patient have difficulty seeing, even when wearing glasses/contacts?: No Does the patient have difficulty concentrating, remembering, or making decisions?: No Patient able to express need for assistance with ADLs?: Yes Does the patient have difficulty dressing or bathing?: No Independently performs ADLs?: Yes (appropriate for developmental age) Does the patient have difficulty walking or climbing stairs?: No Weakness of Legs: None Weakness of Arms/Hands: None  Permission Sought/Granted   Permission granted to share information with : Yes, Verbal Permission Granted  Share Information with NAME: wife           Emotional Assessment Appearance:: Appears stated age Attitude/Demeanor/Rapport: Engaged Affect (typically observed): Accepting Orientation: : Oriented to Self, Oriented to Place, Oriented to  Time, Oriented to Situation Alcohol / Substance Use: Not Applicable Psych Involvement: No (comment)  Admission diagnosis:  Syncope and collapse [R55] Atrial fibrillation with RVR (HCC) [I48.91] Hypotension [I95.9] Patient Active Problem List   Diagnosis Date Noted   Atrial fibrillation with RVR (HCC) 02/13/2023   Hemodynamic instability 02/13/2023   Hypotension 02/12/2023   Poor appetite  02/12/2023   Low energy 02/12/2023   Question of Richter's transformation 02/12/2023   Elevated lactic acid level 02/12/2023   Dehydration 02/12/2023   Hyponatremia 02/12/2023   Elevated liver enzymes 02/12/2023   AKI (acute kidney injury) (HCC) 02/12/2023   Hyperglycemia 02/12/2023   Prediabetes 01/31/2023   Encounter for general adult medical  examination with abnormal findings 05/16/2022   Refused influenza vaccine 08/24/2021   Essential hypertension 08/06/2021   Aortic atherosclerosis (HCC) 08/06/2021   History of gastrointestinal bleeding 01/24/2021   Iron deficiency anemia 09/22/2020   Symptomatic anemia 09/18/2020   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 [place] [address] Phone: [phone] Fax: [phone]     Social Determinants of Health (SDOH) Social History: SDOH Screenings   Food Insecurity: No Food Insecurity (02/12/2023)  Housing: Low Risk  (02/12/2023)  Transportation Needs: No Transportation Needs (02/12/2023)  Utilities: Not At Risk (02/12/2023)  Alcohol Screen: Low Risk  (05/16/2020)  Depression (PHQ2-9): Low Risk  (01/28/2023)  Financial Resource Strain: Low Risk  (05/16/2020)  Physical Activity: Sufficiently Active (05/16/2020)  Social Connections: Moderately Integrated (05/16/2020)  Stress: No Stress Concern Present (05/16/2020)  Tobacco Use: Medium Risk (02/13/2023)   SDOH Interventions:     Readmission Risk Interventions     No data to display

## 2023-02-18 NOTE — TOC Benefit Eligibility Note (Signed)
Pharmacy Patient Advocate Encounter  Insurance verification completed.    The patient is insured through [employer]/ [employer]   Ran test claim for Eliquis 5 mg and the current 30 day co-pay is $0.00.   This test claim was processed through [facility]- copay amounts may vary at other pharmacies due to pharmacy/plan contracts, or as the patient moves through the different stages of their insurance plan.    Roland Earl, CPHT Pharmacy Patient Advocate Specialist [facility] Patient Advocate Team Direct Number: [phone]  Fax: [phone]

## 2023-02-18 NOTE — Discharge Instructions (Signed)

## 2023-02-18 NOTE — Progress Notes (Signed)
ANTICOAGULATION CONSULT NOTE- Follow Up Pharmacy Consult for IV heparin  Indication: atrial fibrillation  No Known Allergies  Patient Measurements: Height: 6\' 4"  (193 cm) Weight: 125.7 kg (277 lb 1.9 oz) IBW/kg (Calculated) : 86.8 Heparin Dosing Weight: 112 kg  Vital Signs: Temp: 97.9 F (36.6 C) (08/05 2328) Temp Source: Oral (08/05 2328) BP: 132/67 (08/05 1927) Pulse Rate: 85 (08/05 2328)  Labs: Recent Labs    02/16/23 0201 02/16/23 0908 02/17/23 0055 02/17/23 1038 02/18/23 0337  HGB 12.6*  --  13.0  --  12.8*  HCT 37.9*  --  38.2*  --  38.8*  PLT 139*  --  128*  --  115*  LABPROT  --   --   --  14.3  --   INR  --   --   --  1.1  --   HEPARINUNFRC 0.27*   < > 0.26* 0.12* 0.27*  CREATININE 1.14  --  1.10  --  1.26*   < > = values in this interval not displayed.    Estimated Creatinine Clearance: 84.7 mL/min (A) (by C-G formula based on SCr of 1.26 mg/dL (H)).   Medical History: Past Medical History:  Diagnosis Date   Chronic lymphocytic leukemia (CLL), B-cell (HCC)    Small Cell Lymphoma   Coronary artery disease, non-occlusive 02/2017   Cardiac cath in setting of MI: 50 and 55% bifurcation LAD-Diag1   Enlarged lymph node    left neck   Hypertension    STEMI (ST elevation myocardial infarction) (HCC) 03/05/2017   hx/notes 03/05/2017 -likely aborted anterior STEMI with 50% bifurcation LAD-Diag1.  No PCI.  Preserved EF   Syncope due to orthostatic hypotension 04/21/2018    Medications:  Infusions:   heparin 2,100 Units/hr (02/17/23 1926)    Assessment: 67 yo male with new onset afib.  Pharmacy asked to start IV heparin per discussion with Dr. Jacques Navy, while awaiting plans for biopsy.  Received 1 dose of lovenox 120 mg (8/1 @ 2308).  Heparin level is slightly subtherapeutic at 0.27 on heparin infusion at 2100 units/hr. Per RN, no issues with heparin running continuously or pauses, no signs/symptoms of bleeding. CBC stable. Plans to resume oral AC    Goal  of Therapy:  Heparin level 0.3-0.7 units/ml Monitor platelets by anticoagulation protocol: Yes   Plan:  Resume heparin at 2300 units/hr Daily heparin level and CBC. Monitor for signs/sx of bleeding Follow up starting oral AC in the AM   Thanks,  Arabella Merles, PharmD. Clinical Pharmacist 02/18/2023 5:14 AM

## 2023-02-18 NOTE — Plan of Care (Signed)

## 2023-02-18 NOTE — Evaluation (Signed)
Occupational Therapy Evaluation Patient Details Name: Adam Reyes MRN: [id number] DOB: 09-22-1955 Today's Date: 02/18/2023   History of Present Illness 67 y.o. male presents to [facility] hospital on 02/12/2023 with deficits in decreased appetite and lightheadedness for multiple days. Pt found to be hypotensive, admitted for management of AKI, dehydration. Pt developed afib with RVR and underwent DCCV on 8/1. L iliac lymph node biopsy on 8/5. PMH includes stage IV small lymphatic lymphoma, anemia, hyperbilirubinemia, CAD, HTN, STEMI.   Clinical Impression   Patient admitted for the diagnosis above.  PTA he lives at home with his spouse and remains independent with all self care and mobility.  Pain is the primary deficit, but he is able to complete his own ADL.  OT recommended a tub transfer bench and long handled sponge to reduce pain and fall risk.  OT discussed compensatory strategies for ADL completion and patient verbalized understanding.  Patient is preparing for home, and no further OT needs exist.  No post acute OT is recommended.  Patient will have assist as needed via spouse.        Recommendations for follow up therapy are one component of a multi-disciplinary discharge planning process, led by the attending physician.  Recommendations may be updated based on patient status, additional functional criteria and insurance authorization.   Assistance Recommended at Discharge Intermittent Supervision/Assistance  Patient can return home with the following Assist for transportation;Assistance with cooking/housework    Functional Status Assessment  Patient has had a recent decline in their functional status and demonstrates the ability to make significant improvements in function in a reasonable and predictable amount of time.  Equipment Recommendations  Tub/shower bench    Recommendations for Other Services       Precautions / Restrictions Precautions Precautions: Fall Restrictions Weight  Bearing Restrictions: No      Mobility Bed Mobility Overal bed mobility: Modified Independent                  Transfers Overall transfer level: Modified independent                        Balance Overall balance assessment: Needs assistance Sitting-balance support: No upper extremity supported, Feet supported Sitting balance-Leahy Scale: Good     Standing balance support: Reliant on assistive device for balance Standing balance-Leahy Scale: Fair                             ADL either performed or assessed with clinical judgement   ADL               Lower Body Bathing: Supervison/ safety;Sit to/from stand       Lower Body Dressing: Supervision/safety;Sit to/from stand                 General ADL Comments: Recommended LH Sponge     Vision Patient Visual Report: No change from baseline       Perception     Praxis      Pertinent Vitals/Pain Pain Assessment Pain Assessment: Faces Faces Pain Scale: Hurts little more Pain Location: L groin radiating down LLE Pain Descriptors / Indicators: Sharp Pain Intervention(s): Monitored during session     Hand Dominance Right   Extremity/Trunk Assessment Upper Extremity Assessment Upper Extremity Assessment: Overall WFL for tasks assessed   Lower Extremity Assessment Lower Extremity Assessment: Defer to PT evaluation LLE Deficits / Details: pt with mild LLE  weakness which appears to be related to discomfort from radiating pain   Cervical / Trunk Assessment Cervical / Trunk Assessment: Normal   Communication Communication Communication: No difficulties   Cognition Arousal/Alertness: Awake/alert Behavior During Therapy: Flat affect Overall Cognitive Status: Within Functional Limits for tasks assessed                                       General Comments  VSS on RA    Exercises     Shoulder Instructions      Home Living Family/patient expects to be  discharged to:: Private residence Living Arrangements: Spouse/significant other Available Help at Discharge: Family;Available 24 hours/day Type of Home: House Home Access: Level entry     Home Layout: Multi-level Alternate Level Stairs-Number of Steps: 8 Alternate Level Stairs-Rails: Left Bathroom Shower/Tub: Chief Strategy Officer: Standard     Home Equipment: None          Prior Functioning/Environment Prior Level of Function : Independent/Modified Independent;Driving                        OT Problem List: Pain      OT Treatment/Interventions:      OT Goals(Current goals can be found in the care plan section) Acute Rehab OT Goals Patient Stated Goal: Return home OT Goal Formulation: With patient Time For Goal Achievement: 02/21/23 Potential to Achieve Goals: Good  OT Frequency:      Co-evaluation              AM-PAC OT "6 Clicks" Daily Activity     Outcome Measure Help from another person eating meals?: None Help from another person taking care of personal grooming?: None Help from another person toileting, which includes using toliet, bedpan, or urinal?: None Help from another person bathing (including washing, rinsing, drying)?: A Little Help from another person to put on and taking off regular upper body clothing?: None Help from another person to put on and taking off regular lower body clothing?: A Little 6 Click Score: 22   End of Session Equipment Utilized During Treatment: Rolling walker (2 wheels) Nurse Communication: Mobility status  Activity Tolerance: Patient tolerated treatment well Patient left: in bed;with call bell/phone within reach  OT Visit Diagnosis: Pain Pain - Right/Left: Left Pain - part of body: Hip;Knee;Leg                Time: 9811-9147 OT Time Calculation (min): 22 min Charges:  OT General Charges $OT Visit: 1 Visit OT Evaluation $OT Eval Moderate Complexity: 1 Mod  02/18/2023  RP, OTR/L  Acute  Rehabilitation Services  Office:  [phone]   Suzanna Obey 02/18/2023, 11:42 AM

## 2023-02-18 NOTE — Evaluation (Signed)
Physical Therapy Evaluation Patient Details Name: MAYEN ALSMAN MRN: [id number] DOB: 03-15-1956 Today's Date: 02/18/2023  History of Present Illness  67 y.o. male presents to [facility] hospital on 02/12/2023 with deficits in decreased appetite and lightheadedness for multiple days. Pt found to be hypotensive, admitted for management of AKI, dehydration. Pt developed afib with RVR and underwent DCCV on 8/1. L iliac lymph node biopsy on 8/5. PMH includes stage IV small lymphatic lymphoma, anemia, hyperbilirubinemia, CAD, HTN, STEMI.  Clinical Impression  Pt presents to PT with deficits in endurance, gait, power, and with pain in LLE affecting gait and stability. Pt benefits from UE support of rollator to improve balance and offload LLE. Rollator will also be beneficial for energy conservation. PT recommends discharge home with rollator and shower seat.         If plan is discharge home, recommend the following: A lot of help with bathing/dressing/bathroom;Assistance with cooking/housework;Help with stairs or ramp for entrance   Can travel by private vehicle        Equipment Recommendations Rollator (4 wheels);Other (comment) (will need taller rollator as pt is 6'4". shower seat)  Recommendations for Other Services       Functional Status Assessment Patient has had a recent decline in their functional status and demonstrates the ability to make significant improvements in function in a reasonable and predictable amount of time.     Precautions / Restrictions Precautions Precautions: Fall Restrictions Weight Bearing Restrictions: No      Mobility  Bed Mobility                    Transfers Overall transfer level: Modified independent Equipment used: Rollator (4 wheels)                    Ambulation/Gait Ambulation/Gait assistance: Supervision Gait Distance (Feet): 150 Feet Assistive device: Rollator (4 wheels) Gait Pattern/deviations: Step-through pattern, Trunk  flexed Gait velocity: functional Gait velocity interpretation: >2.62 ft/sec, indicative of community ambulatory   General Gait Details: step-through gait, increased trunk flexion due to rollator height being too short (at max height available on device at hand)  Stairs            Wheelchair Mobility     Tilt Bed    Modified Rankin (Stroke Patients Only)       Balance Overall balance assessment: Needs assistance Sitting-balance support: No upper extremity supported, Feet supported Sitting balance-Leahy Scale: Good     Standing balance support: Single extremity supported, Reliant on assistive device for balance Standing balance-Leahy Scale: Poor                               Pertinent Vitals/Pain Pain Assessment Pain Assessment: Faces Faces Pain Scale: Hurts whole lot Pain Location: L groin radiating down LLE Pain Descriptors / Indicators: Sharp Pain Intervention(s): Monitored during session    Home Living Family/patient expects to be discharged to:: Private residence Living Arrangements: Spouse/significant other Available Help at Discharge: Family;Available 24 hours/day Type of Home: House Home Access: Level entry     Alternate Level Stairs-Number of Steps: 8 Home Layout: Multi-level Home Equipment: None      Prior Function Prior Level of Function : Independent/Modified Independent;Driving                     Hand Dominance        Extremity/Trunk Assessment   Upper Extremity Assessment Upper Extremity  Assessment: Overall WFL for tasks assessed    Lower Extremity Assessment Lower Extremity Assessment: LLE deficits/detail LLE Deficits / Details: pt with mild LLE weakness which appears to be related to discomfort from radiating pain    Cervical / Trunk Assessment Cervical / Trunk Assessment: Normal  Communication   Communication: No difficulties  Cognition Arousal/Alertness: Awake/alert Behavior During Therapy: WFL for  tasks assessed/performed Overall Cognitive Status: Within Functional Limits for tasks assessed                                          General Comments General comments (skin integrity, edema, etc.): VSS on RA    Exercises     Assessment/Plan    PT Assessment Patient needs continued PT services  PT Problem List Decreased activity tolerance;Decreased balance;Decreased mobility       PT Treatment Interventions DME instruction;Gait training;Stair training;Functional mobility training;Balance training;Patient/family education    PT Goals (Current goals can be found in the Care Plan section)  Acute Rehab PT Goals Patient Stated Goal: to go home PT Goal Formulation: With patient Time For Goal Achievement: 03/04/23 Potential to Achieve Goals: Good Additional Goals Additional Goal #1: Pt will score >19/24 on the DGI to indicate a reduced risk for falls    Frequency Min 1X/week     Co-evaluation               AM-PAC PT "6 Clicks" Mobility  Outcome Measure Help needed turning from your back to your side while in a flat bed without using bedrails?: None Help needed moving from lying on your back to sitting on the side of a flat bed without using bedrails?: None Help needed moving to and from a bed to a chair (including a wheelchair)?: None Help needed standing up from a chair using your arms (e.g., wheelchair or bedside chair)?: None Help needed to walk in hospital room?: A Little Help needed climbing 3-5 steps with a railing? : A Little 6 Click Score: 22    End of Session   Activity Tolerance: Patient tolerated treatment well Patient left: in chair;with call bell/phone within reach Nurse Communication: Mobility status PT Visit Diagnosis: Other abnormalities of gait and mobility (R26.89);Muscle weakness (generalized) (M62.81);Pain Pain - Right/Left: Left Pain - part of body: Leg    Time: 1030-1041 PT Time Calculation (min) (ACUTE ONLY): 11  min   Charges:   PT Evaluation $PT Eval Low Complexity: 1 Low   PT General Charges $$ ACUTE PT VISIT: 1 Visit         Arlyss Gandy, PT, DPT Acute Rehabilitation Office [phone]   Arlyss Gandy 02/18/2023, 10:55 AM

## 2023-02-18 NOTE — Progress Notes (Signed)
All set for discharge awaiting delivery of Rolator and  meds from pharmacy.

## 2023-02-24 NOTE — Progress Notes (Signed)
[name] [address], [place] [address]    Clinic Day:  02/25/2023    Referring physician: Anabel Halon, MD  Patient Care Team: Anabel Halon, MD as PCP - General (Internal Medicine) Marykay Lex, MD as PCP - Cardiology (Cardiology) Doreatha Massed, MD as Consulting Physician (Oncology)   ASSESSMENT & PLAN:   Assessment: 1.  Stage IV small lymphocytic lymphoma: -CLL FISH panel normal, Ig HV positive, T p53 negative. -Ibrutinib 420 mg started on 12/01/2017, 6 cycles of rituximab from 12/14/2017 through 05/21/2018, ibrutinib discontinued on 01/30/2023 with progression. -PET scan (02/06/2023): Left retroperitoneal nodal conglomerate measuring 9.2 x 6.5 cm with SUV 10.76.  Splenomegaly measuring 18 cm with SUV 3.67.  Large nodal mass in the left external iliac chain measures 11.3 x 6.9 cm, SUV 8.89. - Left iliac lymph node biopsy (02/17/2023): CLL/SLL.   2.  Health maintenance: - Colonoscopy on 10/10/2020 showed multiple polyps with no bleeding areas. - EGD on 10/10/2020 showed normal esophagus, few gastric polyps, gastric erosions with no stigmata of recent bleeding. - Push enteroscopy on 11/14/2020 did not reveal any bleeding issues.  Plan: 1.  Stage IV small lymphocytic lymphoma: - I have discussed findings on the left iliac lymph node biopsy from 02/17/2023 which shows CLL/SLL.  I have also discussed the limitations of core needle biopsy. - He has left leg swelling from lymphedema from extrinsic compression by pelvic lymph nodes. - Since there is no evidence of Richter's transformation, I have recommended treatment of his SLL as soon as possible. - We discussed about second line treatment with venetoclax and obinutuzumab. - We will start with obinutuzumab and introduce venetoclax after tumor debulking, around day 22 or day 28. - We discussed side effects in detail. - We will tentatively start him on Thursday.    2.  Left thigh and hip pain: - He is  currently taking tramadol 50 mg every 6 hours during the daytime and oxycodone 5 mg at bedtime. - He reports pain is poorly controlled particularly when he tries to stand up. - Will discontinue tramadol.  Will increase oxycodone 5 mg to every 4 hours as needed.  I have sent new prescription #84. - Constipation well-controlled with Senokot twice daily.   3.  A-fib with RVR: - This was diagnosed when he was hospitalized.  He underwent DCCV and return to normal rhythm. - Continue amiodarone and Eliquis.  4.  TLS prophylaxis: - He will be at high risk for tumor lysis syndrome given the high tumor burden. - Will check tumor lysis labs today. - Will start him on allopurinol 300 mg daily. - Will give him respiratory 6 mg on day 1. - Will also give him IV fluids and repeat tumor lysis labs on day 2.  5.  Hypertension: - Blood pressure is 122/67.  Continue to hold blood pressure medications.    Orders Placed This Encounter  Procedures   Comprehensive metabolic panel    Standing Status:   Future    Number of Occurrences:   1    Standing Expiration Date:   02/25/2024   Magnesium    Standing Status:   Future    Number of Occurrences:   1    Standing Expiration Date:   02/25/2024   Uric acid    Standing Status:   Future    Number of Occurrences:   1    Standing Expiration Date:   02/25/2024   Phosphorus  Standing Status:   Future    Number of Occurrences:   1    Standing Expiration Date:   02/25/2024   CBC with Differential    Standing Status:   Future    Number of Occurrences:   1    Standing Expiration Date:   02/25/2024    Order Specific Question:   Release to patient    Answer:   Immediate [1]    Order Specific Question:   Remote health to draw?    Answer:   No   Uric acid    Standing Status:   Future    Standing Expiration Date:   02/27/2024   Magnesium    Standing Status:   Future    Standing Expiration Date:   02/27/2024   CBC with Differential    Standing Status:   Future     Standing Expiration Date:   02/27/2024   Comprehensive metabolic panel    Standing Status:   Future    Standing Expiration Date:   02/27/2024   Uric acid    Standing Status:   Future    Standing Expiration Date:   02/28/2024   Magnesium    Standing Status:   Future    Standing Expiration Date:   02/28/2024   Uric acid    Standing Status:   Future    Standing Expiration Date:   03/05/2024   Magnesium    Standing Status:   Future    Standing Expiration Date:   03/05/2024   CBC with Differential    Standing Status:   Future    Standing Expiration Date:   03/05/2024   Comprehensive metabolic panel    Standing Status:   Future    Standing Expiration Date:   03/05/2024   Uric acid    Standing Status:   Future    Standing Expiration Date:   03/12/2024   Magnesium    Standing Status:   Future    Standing Expiration Date:   03/12/2024   CBC with Differential    Standing Status:   Future    Standing Expiration Date:   03/12/2024   Comprehensive metabolic panel    Standing Status:   Future    Standing Expiration Date:   03/12/2024   Uric acid    Standing Status:   Future    Standing Expiration Date:   03/26/2024   Magnesium    Standing Status:   Future    Standing Expiration Date:   03/26/2024   CBC with Differential    Standing Status:   Future    Standing Expiration Date:   03/26/2024   Comprehensive metabolic panel    Standing Status:   Future    Standing Expiration Date:   03/26/2024   Magnesium    Standing Status:   Future    Standing Expiration Date:   04/23/2024   CBC with Differential    Standing Status:   Future    Standing Expiration Date:   04/23/2024   Comprehensive metabolic panel    Standing Status:   Future    Standing Expiration Date:   04/23/2024   Magnesium    Standing Status:   Future    Standing Expiration Date:   05/21/2024   CBC with Differential    Standing Status:   Future    Standing Expiration Date:   05/21/2024   Comprehensive metabolic panel     Standing Status:   Future    Standing Expiration Date:  05/21/2024   Magnesium    Standing Status:   Future    Standing Expiration Date:   06/18/2024   CBC with Differential    Standing Status:   Future    Standing Expiration Date:   06/18/2024   Comprehensive metabolic panel    Standing Status:   Future    Standing Expiration Date:   06/18/2024       Mikeal Hawthorne R Teague,acting as a scribe for Doreatha Massed, MD.,have documented all relevant documentation on the behalf of Doreatha Massed, MD,as directed by  Doreatha Massed, MD while in the presence of Doreatha Massed, MD.  I, Doreatha Massed MD, have reviewed the above documentation for accuracy and completeness, and I agree with the above.      Doreatha Massed, MD   8/13/20244:13 PM  CHIEF COMPLAINT:   Diagnosis: small lymphocytic lymphoma and symptomatic anemia    Cancer Staging  No matching staging information was found for the patient.    Prior Therapy: Rituximab monthly from 01/01/2018 to 05/21/2018   Current Therapy:  Ibrutinib 420 mg daily; intermittent Feraheme last on 09/29/2020    HISTORY OF PRESENT ILLNESS:   Oncology History  Chronic lymphocytic leukemia (CLL), B-cell (HCC)  06/14/2015 Imaging   CT neck- Bulky adenopathy throughout the neck bilaterally. There also are enlarged parotid lymph nodes bilaterally. There is bilateral axillary adenopathy as well as mediastinal adenopathy. Findings are consistent with lymphoma. Biopsy recommended.   06/27/2015 Procedure   Left neck lymph node biopsy by Dr. Suszanne Conners   06/27/2015 Pathology Results   Diagnosis Lymph node for lymphoma, Left neck node for lymphoma work up - SMALL LYMPHOCYTIC LYMPHOMA.  LOW GRADE.   06/27/2015 Pathology Results   Tissue-Flow Cytometry - MONOCLONAL B CELL POPULATION IDENTIFIED. The phenotypic features are consistent with small lymphocytic lymphoma/chronic lymphocytic leukemia and correlate well with the morphology in  the lymph node   02/27/2023 -  Chemotherapy   Patient is on Treatment Plan : LYMPHOMA CLL/SLL Venetoclax + Obinutuzumab q28d     Lymphoma, small lymphocytic (HCC)  10/14/2017 Initial Diagnosis   Lymphoma, small lymphocytic (HCC)   01/01/2018 - 05/21/2018 Chemotherapy   The patient had riTUXimab (RITUXAN) 900 mg in sodium chloride 0.9 % 250 mL (2.6471 mg/mL) infusion, 375 mg/m2 = 900 mg, Intravenous,  Once, 6 of 6 cycles Dose modification: 500 mg/m2 (original dose 500 mg/m2, Cycle 2, Reason: Provider Judgment) Administration: 900 mg (01/01/2018), 1,300 mg (01/29/2018), 1,300 mg (02/26/2018), 1,300 mg (03/26/2018), 1,300 mg (04/23/2018), 1,300 mg (05/21/2018)  for chemotherapy treatment.       INTERVAL HISTORY:   Adam Reyes is a 67 y.o. male presenting to clinic today for follow up of small lymphocytic lymphoma and symptomatic anemia. He was last seen by me on 02/12/23.  Since his last visit, he had a left iliac lymph node biopsy on 02/17/23. Surgical pathology revealed: lymph node with persistent involvement by the patient's known chronic lymphocytic leukemia/small lymphocytic lymphoma.   He had a limited abdomen CT on 02/13/23 that found: unchanged  bulky retrocrural, retroperitoneal, and bilateral iliac lymphadenopathy, with numerous additional prominent lymph nodes throughout the bowel mesentery; gross splenomegaly, maximum span 20.3 cm; and descending colonic diverticulosis without evidence of acute diverticulitis.  Today, he states that he is doing well overall. His appetite level is at 25%. His energy level is at 25%.   PAST MEDICAL HISTORY:   Past Medical History: Past Medical History:  Diagnosis Date   Chronic lymphocytic leukemia (CLL), B-cell (HCC)    Small  Cell Lymphoma   Coronary artery disease, non-occlusive 02/2017   Cardiac cath in setting of MI: 50 and 55% bifurcation LAD-Diag1   Enlarged lymph node    left neck   Hypertension    STEMI (ST elevation myocardial infarction) (HCC)  03/05/2017   hx/notes 03/05/2017 -likely aborted anterior STEMI with 50% bifurcation LAD-Diag1.  No PCI.  Preserved EF   Syncope due to orthostatic hypotension 04/21/2018    Surgical History: Past Surgical History:  Procedure Laterality Date   BIOPSY  10/10/2020   Procedure: BIOPSY;  Surgeon: Dolores Frame, MD;  [facility];  Service: Gastroenterology;;   BIOPSY  11/14/2020   Procedure: BIOPSY;  Surgeon: Dolores Frame, MD;  [facility];  Service: Gastroenterology;;   COLONOSCOPY N/A 10/11/2015   Procedure: COLONOSCOPY;  Surgeon: Malissa Hippo, MD;  [facility];  Service: Endoscopy;  Laterality: N/A;  10/11/2015   COLONOSCOPY WITH PROPOFOL N/A 10/10/2020   Procedure: COLONOSCOPY WITH PROPOFOL;  Surgeon: Dolores Frame, MD;  [facility];  Service: Gastroenterology;  Laterality: N/A;  AM   ENTEROSCOPY N/A 11/14/2020   Procedure: push enteroscopy;  Surgeon: Dolores Frame, MD;  [facility];  Service: Gastroenterology;  Laterality: N/A;   ESOPHAGOGASTRODUODENOSCOPY (EGD) WITH PROPOFOL N/A 10/10/2020   Procedure: ESOPHAGOGASTRODUODENOSCOPY (EGD) WITH PROPOFOL;  Surgeon: Dolores Frame, MD;  [facility];  Service: Gastroenterology;  Laterality: N/A;   ESOPHAGOGASTRODUODENOSCOPY (EGD) WITH PROPOFOL N/A 11/14/2020   Procedure: ESOPHAGOGASTRODUODENOSCOPY (EGD) WITH PROPOFOL;  Surgeon: Dolores Frame, MD;  [facility];  Service: Gastroenterology;  Laterality: N/A;  12:00   GIVENS CAPSULE STUDY N/A 11/07/2020   Procedure: GIVENS CAPSULE STUDY;  Surgeon: Dolores Frame, MD;  [facility];  Service: Gastroenterology;  Laterality: N/A;  7:30 am   Graded Exercise Tolerance Test (GXT/ETT)  05/2017   10.7 METs (9: 25 min.  Reached 103% max predicted heart rate).  No EKG findings to suggest coronary ischemia.  Negative, low risk GXT   HEMOSTASIS  CLIP PLACEMENT  11/14/2020   Procedure: HEMOSTASIS CLIP PLACEMENT;  Surgeon: Dolores Frame, MD;  [facility];  Service: Gastroenterology;;  small bowel nodule   LEFT HEART CATH AND CORONARY ANGIOGRAPHY N/A 03/05/2017   Procedure: LEFT HEART CATH AND CORONARY ANGIOGRAPHY;  Surgeon: Marykay Lex, MD;  Location: [facility];  Service: Cardiovascular: pLAD-Diag1 50-55% (non-flow-limiiting).  EF ~50-55%.  although bifurcation lesion was presumed Culprit - no PCI (not flow limiting). - Med Rx.   MASS BIOPSY Left 06/27/2015   Procedure: OPEN LEFT NECK BIOPSY ;  Surgeon: Newman Pies, MD;  Location: [facility];  Service: ENT;  Laterality: Left;   POLYPECTOMY  10/10/2020   Procedure: POLYPECTOMY;  Surgeon: Dolores Frame, MD;  [facility];  Service: Gastroenterology;;   REFRACTIVE SURGERY Bilateral     Social History: Social History   Socioeconomic History   Marital status: Married    Spouse name: Not on file   Number of children: Not on file   Years of education: Not on file   Highest education level: Not on file  Occupational History   Not on file  Tobacco Use   Smoking status: Former    Types: Cigarettes   Smokeless tobacco: Former    Types: Chew, Snuff   Tobacco comments:    03/06/2017 "quit smoking when I was young; quit chew/snuff in ~ 2008"  Vaping Use   Vaping status: Never Used  Substance and Sexual Activity   Alcohol use: Yes    Alcohol/week: 2.0 standard drinks of alcohol    Types: 2 Cans of beer per week    Comment: 2-3 drinks weekly   Drug use: No   Sexual activity: Yes  Other Topics Concern   Not on file  Social History Narrative   Married since 2001,third.Lives with wife.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   Social Determinants of Health   Financial Resource Strain: Low Risk  (05/16/2020)   Overall Financial Resource Strain (CARDIA)    Difficulty of Paying Living Expenses: Not hard at all  Food Insecurity: No Food  Insecurity (02/12/2023)   Hunger Vital Sign    Worried About Running Out of Food in the Last Year: Never true    Ran Out of Food in the Last Year: Never true  Transportation Needs: No Transportation Needs (02/12/2023)   PRAPARE - Administrator, Civil Service (Medical): No    Lack of Transportation (Non-Medical): No  Physical Activity: Sufficiently Active (05/16/2020)   Exercise Vital Sign    Days of Exercise per Week: 5 days    Minutes of Exercise per Session: 30 min  Stress: No Stress Concern Present (05/16/2020)   [employer] of Occupational Health - Occupational Stress Questionnaire    Feeling of Stress : Not at all  Social Connections: Moderately Integrated (05/16/2020)   Social Connection and Isolation Panel [NHANES]    Frequency of Communication with Friends and Family: More than three times a week    Frequency of Social Gatherings with Friends and Family: More than three times a week    Attends Religious Services: More than 4 times per year    Active Member of [employer] or Organizations: No    Attends Banker Meetings: Never    Marital Status: Married  Catering manager Violence: Not At Risk (02/12/2023)   Humiliation, Afraid, Rape, and Kick questionnaire    Fear of Current or Ex-Partner: No    Emotionally Abused: No    Physically Abused: No    Sexually Abused: No    Family History: Family History  Problem Relation Age of Onset   Diabetes Mother    Cancer Father    Cancer Brother     Current Medications:  Current Outpatient Medications:    amiodarone (PACERONE) 200 MG tablet, Take 2 tablets (400 mg total) by mouth 2 (two) times daily for 7 days, THEN 1 tablet (200 mg total) daily., Disp: 111 tablet, Rfl: 0   apixaban (ELIQUIS) 5 MG TABS tablet, Take 1 tablet (5 mg total) by mouth 2 (two) times daily., Disp: 60 tablet, Rfl: 2   atorvastatin (LIPITOR) 40 MG tablet, TAKE ONE (1) TABLET BY MOUTH EVERY DAY, Disp: 90 tablet, Rfl: 3   cholecalciferol  (VITAMIN D3) 25 MCG (1000 UNIT) tablet, Take 10,000 Units by mouth daily., Disp: , Rfl:    ferrous sulfate 324 (65 Fe) MG TBEC, Take 324 mg by mouth every other day., Disp: , Rfl:    oxyCODONE (OXY IR/ROXICODONE) 5 MG immediate release tablet, Take 1 tablet (5 mg total) by mouth every 6 (six) hours as needed for up to 7 days for severe pain., Disp: 28 tablet, Rfl: 0   oxyCODONE (OXY IR/ROXICODONE) 5 MG immediate release tablet, Take 1 tablet (5 mg total) by mouth every 4 (four) hours as needed for severe pain., Disp: 84 tablet, Rfl: 0   senna-docusate (SENOKOT-S) 8.6-50 MG tablet,  Take 1 tablet by mouth 2 (two) times daily between meals as needed for mild constipation., Disp: , Rfl:    traMADol (ULTRAM) 50 MG tablet, Take 1 tablet (50 mg total) by mouth every 6 (six) hours as needed., Disp: 60 tablet, Rfl: 0   Allergies: No Known Allergies  REVIEW OF SYSTEMS:   Review of Systems  Constitutional:  Positive for fatigue. Negative for chills and fever.  HENT:   Negative for lump/mass, mouth sores, nosebleeds, sore throat and trouble swallowing.   Eyes:  Negative for eye problems.  Respiratory:  Negative for cough and shortness of breath.   Cardiovascular:  Positive for leg swelling. Negative for chest pain and palpitations.  Gastrointestinal:  Positive for constipation. Negative for abdominal pain, diarrhea, nausea and vomiting.  Genitourinary:  Negative for bladder incontinence, difficulty urinating, dysuria, frequency, hematuria and nocturia.   Musculoskeletal:  Negative for arthralgias, back pain, flank pain, myalgias and neck pain.  Skin:  Negative for itching and rash.  Neurological:  Negative for dizziness, headaches and numbness.  Hematological:  Does not bruise/bleed easily.  Psychiatric/Behavioral:  Negative for depression, sleep disturbance and suicidal ideas. The patient is not nervous/anxious.   All other systems reviewed and are negative.    VITALS:   Blood pressure 122/67,  pulse 98, temperature (!) 97.4 F (36.3 C), resp. rate 20, SpO2 96%.  Wt Readings from Last 3 Encounters:  02/18/23 278 lb (126.1 kg)  02/12/23 268 lb (121.6 kg)  01/30/23 270 lb (122.5 kg)    There is no height or weight on file to calculate BMI.  Performance status (ECOG): 1 - Symptomatic but completely ambulatory  PHYSICAL EXAM:   Physical Exam Vitals and nursing note reviewed. Exam conducted with a chaperone present.  Constitutional:      Appearance: Normal appearance.  Cardiovascular:     Rate and Rhythm: Normal rate and regular rhythm.     Pulses: Normal pulses.     Heart sounds: Normal heart sounds.  Pulmonary:     Effort: Pulmonary effort is normal.     Breath sounds: Normal breath sounds.  Abdominal:     Palpations: Abdomen is soft. There is no hepatomegaly, splenomegaly or mass.     Tenderness: There is no abdominal tenderness.  Musculoskeletal:     Right lower leg: No edema.     Left lower leg: Edema present.  Lymphadenopathy:     Cervical: No cervical adenopathy.     Right cervical: No superficial, deep or posterior cervical adenopathy.    Left cervical: No superficial, deep or posterior cervical adenopathy.     Upper Body:     Right upper body: No supraclavicular or axillary adenopathy.     Left upper body: No supraclavicular or axillary adenopathy.  Neurological:     General: No focal deficit present.     Mental Status: He is alert and oriented to person, place, and time.  Psychiatric:        Mood and Affect: Mood normal.        Behavior: Behavior normal.     LABS:      Latest Ref Rng & Units 02/25/2023    2:55 PM 02/18/2023    3:37 AM 02/17/2023   12:55 AM  CBC  WBC 4.0 - 10.5 K/uL 20.0  20.6  17.9   Hemoglobin 13.0 - 17.0 g/dL 78.2  95.6  21.3   Hematocrit 39.0 - 52.0 % 41.3  38.8  38.2   Platelets 150 - 400 K/uL  101  115  128       Latest Ref Rng & Units 02/25/2023    2:55 PM 02/18/2023    3:37 AM 02/17/2023   12:55 AM  CMP  Glucose 70 - 99  mg/dL 161  87  96   BUN 8 - 23 mg/dL 34  32  25   Creatinine 0.61 - 1.24 mg/dL 0.96  0.45  4.09   Sodium 135 - 145 mmol/L 130  132  134   Potassium 3.5 - 5.1 mmol/L 5.1  4.5  3.6   Chloride 98 - 111 mmol/L 97  100  100   CO2 22 - 32 mmol/L 21  23  22    Calcium 8.9 - 10.3 mg/dL 8.9  8.7  8.8   Total Protein 6.5 - 8.1 g/dL 5.8  4.8    Total Bilirubin 0.3 - 1.2 mg/dL 2.7  1.9    Alkaline Phos 38 - 126 U/L 162  110    AST 15 - 41 U/L 53  54    ALT 0 - 44 U/L 45  40       No results found for: "CEA1", "CEA" / No results found for: "CEA1", "CEA" No results found for: "PSA1" No results found for: "WJX914" No results found for: "CAN125"  Lab Results  Component Value Date   TOTALPROTELP 6.5 07/11/2015   TOTALPROTELP 6.4 07/11/2015   ALBUMINELP 3.7 07/11/2015   A1GS 0.3 07/11/2015   A2GS 0.5 07/11/2015   BETS 1.0 07/11/2015   GAMS 1.0 07/11/2015   MSPIKE 0.4 (H) 07/11/2015   SPEI Comment 07/11/2015   Lab Results  Component Value Date   TIBC 321 01/23/2023   TIBC 341 09/16/2022   TIBC 375 04/11/2022   FERRITIN 85 01/23/2023   FERRITIN 41 09/16/2022   FERRITIN 41 04/11/2022   IRONPCTSAT 28 01/23/2023   IRONPCTSAT 28 09/16/2022   IRONPCTSAT 34 04/11/2022   Lab Results  Component Value Date   LDH 159 01/23/2023   LDH 142 09/16/2022   LDH 168 04/11/2022     STUDIES:   US BIOPSY (ABDOMINAL RETROPERTIONEAL MASS)  Result Date: 02/17/2023 INDICATION: 67 year old male with diffuse lymphadenopathy, history of small cell lymphocytic lymphoma. EXAM: Ultrasound-guided lymph node biopsy MEDICATIONS: None. ANESTHESIA/SEDATION: Moderate (conscious) sedation was employed during this procedure. A total of Versed 1 mg and Fentanyl 50 mcg was administered intravenously by the radiology nurse. Total intra-service moderate Sedation Time: 4 minutes. The patient's level of consciousness and vital signs were monitored continuously by radiology nursing throughout the procedure under my direct  supervision. COMPLICATIONS: None immediate. PROCEDURE: Informed written consent was obtained from the patient after a thorough discussion of the procedural risks, benefits and alternatives. All questions were addressed. Maximal Sterile Barrier Technique was utilized including caps, mask, sterile gowns, sterile gloves, sterile drape, hand hygiene and skin antiseptic. A timeout was performed prior to the initiation of the procedure. Preprocedure ultrasound evaluation demonstrated multifocal prominent, superficial left iliac lymph nodes. Procedure was planned. Subdermal Local anesthesia was provided 1% lidocaine. Deeper local anesthetic was administered to the periphery of the lymph node under direct ultrasound visualization. A 17 gauge Coaxial introducer needle was directed to the periphery of the targeted lymph node. A total of 3, 18 gauge core biopsies were then obtained and placed in saline. The coaxial needle was removed. Hemostasis was achieved with brief manual compression. Postprocedure ultrasound evaluation demonstrated no evidence of surrounding hematoma or other complicating features. The patient tolerated the procedure well was transferred  to the recovery area in good condition. IMPRESSION: Technically successful ultrasound-guided left iliac lymph node biopsy. Marliss Coots, MD [facility] Electronically Signed   By: Marliss Coots M.D.   On: 02/17/2023 13:25   ECHOCARDIOGRAM COMPLETE  Result Date: 02/14/2023    ECHOCARDIOGRAM REPORT   Patient Name:   Adam Reyes Date of Exam: 02/14/2023 Medical Rec #:  [id number]     Height:       76.0 in Accession #:    [id number]    Weight:       269.2 lb Date of Birth:  Jun 17, 1956      BSA:          2.513 m Patient Age:    67 years      BP:           97/52 mmHg Patient Gender: M             HR:           85 bpm. Exam Location:  Inpatient Procedure: 2D Echo, Cardiac Doppler, Color Doppler and Intracardiac             Opacification Agent Indications:    Atrial fibrillation  History:        Patient has prior history of Echocardiogram examinations, most                 recent 07/24/2021. CAD and Previous Myocardial Infarction; Risk                 Factors:Hypertension. Lymphoma.  Sonographer:    Milda Smart Referring Phys: [id number] Boyce Medici GONFA  Sonographer Comments: Image acquisition challenging due to patient body habitus and Image acquisition challenging due to respiratory motion. IMPRESSIONS  1. Left ventricular ejection fraction, by estimation, is 70 to 75%. The left ventricle has hyperdynamic function. The left ventricle has no regional wall motion abnormalities. There is mild asymmetric left ventricular hypertrophy of the basal-septal segment. Peak LV outflow tract gradient 94 mmHg. Left ventricular diastolic parameters are consistent with Grade II diastolic dysfunction (pseudonormalization).  2. Right ventricular systolic function is normal. The right ventricular size is normal. Tricuspid regurgitation signal is inadequate for assessing PA pressure.  3. Left atrial size was mildly dilated.  4. There is mitral valve systolic anterior motion. The mitral valve is abnormal. Trivial mitral valve regurgitation. No evidence of mitral stenosis.  5. The aortic valve is tricuspid. Aortic valve regurgitation is mild to moderate. No aortic stenosis is present.  6. Aortic dilatation noted. There is mild dilatation of the ascending aorta, measuring 40 mm.  7. The inferior vena cava is normal in size with greater than 50% respiratory variability, suggesting right atrial pressure of 3 mmHg.  8. Vigorous LV systolic function with LVOT gradient and SAM. Patient has HOCM physiology. FINDINGS  Left Ventricle: Left ventricular ejection fraction, by estimation, is 70 to 75%. The left ventricle has hyperdynamic function. The left ventricle has no regional wall motion abnormalities. Definity contrast agent was given IV to delineate the left  ventricular endocardial borders. The left ventricular internal cavity size was normal in size. There is mild asymmetric left ventricular hypertrophy of the basal-septal segment. Left ventricular diastolic parameters are consistent with Grade II diastolic  dysfunction (pseudonormalization). Right Ventricle: The right ventricular size is normal. No increase in right ventricular wall thickness. Right ventricular systolic function is normal. Tricuspid regurgitation signal is inadequate for assessing PA pressure. Left Atrium: Left atrial size was mildly  dilated. Right Atrium: Right atrial size was normal in size. Pericardium: There is no evidence of pericardial effusion. Mitral Valve: There is mitral valve systolic anterior motion. The mitral valve is abnormal. Trivial mitral valve regurgitation. No evidence of mitral valve stenosis. MV peak gradient, 6.0 mmHg. The mean mitral valve gradient is 3.0 mmHg. Tricuspid Valve: The tricuspid valve is normal in structure. Tricuspid valve regurgitation is trivial. Aortic Valve: The aortic valve is tricuspid. Aortic valve regurgitation is mild to moderate. Aortic regurgitation PHT measures 450 msec. No aortic stenosis is present. Aortic valve mean gradient measures 48.0 mmHg. Aortic valve peak gradient measures 93.7 mmHg. Pulmonic Valve: The pulmonic valve was normal in structure. Pulmonic valve regurgitation is trivial. Aorta: The aortic root is normal in size and structure and aortic dilatation noted. There is mild dilatation of the ascending aorta, measuring 40 mm. Venous: The inferior vena cava is normal in size with greater than 50% respiratory variability, suggesting right atrial pressure of 3 mmHg. IAS/Shunts: No atrial level shunt detected by color flow Doppler.  LEFT VENTRICLE PLAX 2D LVIDd:         5.50 cm      Diastology LVIDs:         3.10 cm      LV e' medial:    6.96 cm/s LV PW:         1.00 cm      LV E/e' medial:  10.6 LV IVS:        0.80 cm      LV e' lateral:    8.16 cm/s LVOT diam:     2.40 cm      LV E/e' lateral: 9.1 LVOT Area:     4.52 cm  LV Volumes (MOD) LV vol d, MOD A2C: 135.0 ml LV vol d, MOD A4C: 130.0 ml LV vol s, MOD A2C: 33.0 ml LV vol s, MOD A4C: 38.4 ml LV SV MOD A2C:     102.0 ml LV SV MOD A4C:     130.0 ml LV SV MOD BP:      97.9 ml RIGHT VENTRICLE RV Basal diam:  2.60 cm RV S prime:     21.10 cm/s TAPSE (M-mode): 2.5 cm LEFT ATRIUM             Index        RIGHT ATRIUM           Index LA diam:        3.60 cm 1.43 cm/m   RA Area:     14.20 cm LA Vol (A2C):   83.1 ml 33.06 ml/m  RA Volume:   31.00 ml  12.33 ml/m LA Vol (A4C):   84.0 ml 33.42 ml/m LA Biplane Vol: 90.8 ml 36.13 ml/m  AORTIC VALVE AV Vmax:      484.00 cm/s AV Vmean:     329.000 cm/s AV VTI:       0.756 m AV Peak Grad: 93.7 mmHg AV Mean Grad: 48.0 mmHg AI PHT:       450 msec  AORTA Ao Root diam: 3.60 cm Ao Asc diam:  4.00 cm MITRAL VALVE MV Peak grad: 6.0 mmHg     SHUNTS MV Mean grad: 3.0 mmHg     Systemic Diam: 2.40 cm MV Vmax:      1.22 m/s MV Vmean:     81.3 cm/s MR Peak grad: 96.0 mmHg MR Vmax:      490.00 cm/s MV E velocity: 73.90 cm/s MV A velocity:  89.90 cm/s MV E/A ratio:  0.82 Dalton McleanMD Electronically signed by Wilfred Lacy Signature Date/Time: 02/14/2023/11:17:54 AM    Final    CT ABDOMEN LIMITED WO CONTRAST  Result Date: 02/13/2023 CLINICAL DATA:  Retroperitoneal biopsy, procedure deferred due to onset of AFib * Tracking Code: BO * EXAM: CT ABDOMEN WITHOUT CONTRAST LIMITED TECHNIQUE: Multidetector CT imaging of the abdomen was performed following the standard protocol without IV contrast. RADIATION DOSE REDUCTION: This exam was performed according to the departmental dose-optimization program which includes automated exposure control, adjustment of the mA and/or kV according to patient size and/or use of iterative reconstruction technique. COMPARISON:  CT abdomen pelvis, 02/12/2023 FINDINGS: Hepatobiliary: No solid liver abnormality is seen. No gallstones, gallbladder  wall thickening, or biliary dilatation. Pancreas: Unremarkable. No pancreatic ductal dilatation or surrounding inflammatory changes. Spleen: Gross splenomegaly, maximum span 20.3 cm. Adrenals/Urinary Tract: Adrenal glands are unremarkable. Simple, benign left renal cortical cysts, for which no further follow-up or characterization is required kidneys are otherwise normal, without renal calculi, solid lesion, or hydronephrosis. Stomach/Bowel: Stomach is within normal limits. No evidence of bowel wall thickening, distention, or inflammatory changes. Descending colonic diverticulosis. Vascular/Lymphatic: Aortic atherosclerosis. Unchanged bulky retrocrural, retroperitoneal, and bilateral iliac lymphadenopathy, with numerous additional prominent lymph nodes throughout the bowel mesentery. Other: No abdominal wall hernia or abnormality. No ascites. Musculoskeletal: No acute or significant osseous findings. IMPRESSION: 1. Unchanged bulky retrocrural, retroperitoneal, and bilateral iliac lymphadenopathy, with numerous additional prominent lymph nodes throughout the bowel mesentery. 2. Gross splenomegaly, maximum span 20.3 cm. 3. Findings are unchanged in comparison to prior day's examination and most consistent with lymphoma. No acute findings. 4. Descending colonic diverticulosis without evidence of acute diverticulitis. Aortic Atherosclerosis (ICD10-I70.0). Electronically Signed   By: Jearld Lesch M.D.   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 Right lower quadrant abdominal pain. 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 * Tracking Code: BO * EXAM: CT ABDOMEN AND PELVIS WITH CONTRAST TECHNIQUE: Multidetector CT imaging of the abdomen and pelvis was performed using the standard protocol following bolus administration of intravenous contrast. RADIATION DOSE REDUCTION: This exam was performed  according to the departmental dose-optimization program which includes automated exposure control, adjustment of the mA and/or kV according to patient size and/or use of iterative reconstruction technique. CONTRAST:  OMNIPAQUE IOHEXOL 300 MG/ML  SOLN COMPARISON:  PET-CT 02/06/2023. CT of the chest, abdomen and pelvis 01/24/2023 and 04/11/2022. FINDINGS: Lower chest: Stable linear atelectasis or scarring at both lung bases. Small pulmonary nodules bilaterally are unchanged from recent prior imaging, measuring up to 8 mm in the right lower lobe and 9 mm in the left lower lobe, both on image 9/5. There is progressive subcarinal and distal paraesophageal adenopathy as seen on recent PET-CT. A distal right paraesophageal node measures up to 5.4 x 4.6 cm on image 14/2. Hepatobiliary: The liver is normal in density without suspicious focal abnormality. No evidence of gallstones, gallbladder wall thickening or biliary dilatation. Pancreas: Unremarkable. No pancreatic ductal dilatation or surrounding inflammatory changes. Spleen: Progressive splenomegaly compared with recent CT. No focal abnormality identified. Adrenals/Urinary Tract: Both adrenal glands appear normal. No evidence of urinary tract calculus, suspicious renal lesion or hydronephrosis. Unchanged cyst in the mid left kidney for which no follow-up imaging is recommended. Stable bladder compression by the bilateral pelvic lymphadenopathy. No intrinsic bladder abnormality identified. Stomach/Bowel: No enteric contrast administered. The stomach appears unremarkable for its  degree of distension. No evidence of bowel wall thickening, distention or surrounding inflammatory change. Vascular/Lymphatic: Again demonstrated is extensive retroperitoneal, pelvic and bilateral inguinal adenopathy. No significant change is seen from the recent PET-CT, although some of the lymph nodes have enlarged from the diagnostic CT done 01/24/2023. For example, there is a left  external iliac node measuring 4.5 cm short axis on image 86/2 which previously measured 3.7 cm. A presacral node measuring 3.1 cm on image 72/2 previously measured 2.3 cm. No nodal necrosis identified. Mild aortic and branch vessel atherosclerosis without evidence of aneurysm or large vessel occlusion. Reproductive: The prostate gland appears mildly enlarged, but stable. Other: No evidence of abdominal wall mass or hernia. No ascites or pneumoperitoneum. Musculoskeletal: No acute or significant osseous findings. There is multilevel spondylosis with a grade 1 anterolisthesis at L5-S1 secondary to underlying pars defects. Associated prominent foraminal narrowing in the lower lumbar spine, unchanged. Unless specific follow-up recommendations are mentioned in the findings or impression sections, no imaging follow-up of any mentioned incidental findings is recommended. IMPRESSION: 1. Known extensive adenopathy in the lower chest, abdomen and pelvis has mildly progressed compared with the CTs of less than 3 weeks earlier consistent with progressive lymphoma. 2. Progressive splenomegaly. 3. Stable small pulmonary nodules at both lung bases. 4. No definite acute findings. 5. 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 Associated prominent foraminal narrowing in the lower lumbar spine. 6.  Aortic Atherosclerosis (ICD10-I70.0). Electronically Signed   By: Carey Bullocks M.D.   On: 02/12/2023 12:16   DG Chest Port 1 View  Result Date: 02/12/2023 CLINICAL DATA:  Sepsis.  History of lymphoma EXAM: PORTABLE CHEST 1 VIEW COMPARISON:  PET-CT 02/06/2023 FINDINGS: Subtle bandlike opacity right lung base. Atelectasis is favored. No consolidation, pneumothorax or effusion. No edema. Normal cardiopericardial silhouette. Overlapping cardiac leads. IMPRESSION: Right basilar atelectasis.  No pneumothorax or effusion. Electronically Signed   By: Karen Kays M.D.   On:  02/12/2023 11:29   NM PET Image Restag (PS) Skull Base To Thigh  Result Date: 02/12/2023 CLINICAL DATA:  Subsequent treatment strategy for small lymphocytic lymphoma. EXAM: NUCLEAR MEDICINE PET SKULL BASE TO THIGH TECHNIQUE: 13.3 mCi F-18 FDG was injected intravenously. Full-ring PET imaging was performed from the skull base to thigh after the radiotracer. CT data was obtained and used for attenuation correction and anatomic localization. Fasting blood glucose: 105 mg/dl COMPARISON:  CT 1/61/09 FINDINGS: Mediastinal blood pool activity: SUV max 2.51 Liver activity: SUV max 3.29 NECK: Bilateral cervical adenopathy is identified with corresponding mild increased uptake. -right level 2 node measures 1.6 cm with SUV max of 2.75. -Left level 3 lymph node measures 1.3 cm with SUV max of 2.69, image 59/202. Incidental CT findings: None. CHEST: Left superior mediastinal prevascular lymph node measures 1.7 cm with SUV max of 3.93, image 86/202. Multiple tracer avid posterior mediastinal lymph nodes are identified. -aorto esophageal lymph node measures 1.8 cm with SUV max of 3.99. -Right posterior mediastinal node just above the GE junction measures 3.2 cm with SUV max of 4.71, image 148/202. -Left lower posterior mediastinal node measures 3.4 cm with SUV max 4.59, image 148/202. Bilateral retropectoral adenopathy is identified which exhibits mild tracer uptake. -Right retropectoral lymph node measures 1.5 cm with SUV max of 1.82, image 90/202. -Left retropectoral lymph node measures 1.4 cm with SUV max of 1.98, image 94/202. Small bilateral lung nodules are again noted. -Index nodule within the anterior left upper lobe measures 7  mm with SUV max of 0.89, image 88/202. -1 cm nodule within the basilar right upper lobe has an SUV max of 0.71, image 98/202. Incidental CT findings: Aortic atherosclerosis and coronary artery calcifications. ABDOMEN/PELVIS: There is extensive tracer avid abdominopelvic adenopathy. -left  retrocrural lymph node measures 2.4 cm with SUV max of 5.19, image 165/202. -Left retroperitoneal nodal conglomeration measures 9.2 x 6.5 cm with SUV max of 10.76, image 200/202. -Right common iliac lymph node measures 1.9 cm with SUV max of 4.65, image 238/202 -Left inguinal lymph node measures 1.7 cm with SUV max of 3.07, image 286/202. -Large nodal conglomeration within the left external iliac chain measures 11.3 x 6.9 cm with an SUV max 8.89, image 260/202. Diffuse increased uptake within the spleen has an SUV max 3.67. The spleen is enlarged measuring 18.09 cm in cranial caudal dimension. No abnormal tracer uptake identified within the liver, pancreas, or adrenal glands. Incidental CT findings: Left kidney cyst measures 6.3 cm, image 190/202. Aortic atherosclerotic calcifications. SKELETON: No focal hypermetabolic activity to suggest skeletal metastasis. Incidental CT findings: None. IMPRESSION: 1. Extensive tracer avid adenopathy within the chest, abdomen, and pelvis compatible with lymphoma. Deauville criteria 4/5. 2. Splenomegaly with diffuse increased uptake concerning for splenic involvement. Deauville criteria 4 3. Bilateral lung nodules are not significantly tracer avid. However these lung nodules are too small to reliably characterize by PET-CT. 4. Aortic Atherosclerosis (ICD10-I70.0). Electronically Signed   By: Signa Kell M.D.   On: 02/12/2023 09:46   US Venous Img Lower Unilateral Left  Result Date: 02/04/2023 CLINICAL DATA:  Swelling left leg EXAM: Left LOWER EXTREMITY VENOUS DOPPLER ULTRASOUND TECHNIQUE: Gray-scale sonography with compression, as well as color and duplex ultrasound, were performed to evaluate the deep venous system(s) from the level of the common femoral vein through the popliteal and proximal calf veins. COMPARISON:  None Available. FINDINGS: VENOUS Normal compressibility of the common femoral, superficial femoral, and popliteal veins, as well as the visualized calf veins.  Visualized portions of profunda femoral vein and great saphenous vein unremarkable. No filling defects to suggest DVT on grayscale or color Doppler imaging. Doppler waveforms show normal direction of venous flow, normal respiratory plasticity and response to augmentation. Limited views of the contralateral common femoral vein are unremarkable. OTHER Of note there is some slow flow in the left popliteal vein, nonspecific. This vein also is distended. Limitations: none IMPRESSION: No evidence of left lower extremity DVT. Nonspecific finding of distended left popliteal vein with slow flow Electronically Signed   By: Karen Kays M.D.   On: 02/04/2023 10:15

## 2023-02-25 ENCOUNTER — Inpatient Hospital Stay: Payer: PPO

## 2023-02-25 ENCOUNTER — Other Ambulatory Visit: Payer: Self-pay

## 2023-02-25 ENCOUNTER — Inpatient Hospital Stay: Payer: PPO | Attending: Hematology | Admitting: Hematology

## 2023-02-25 ENCOUNTER — Encounter: Payer: Self-pay | Admitting: Hematology

## 2023-02-25 ENCOUNTER — Telehealth: Payer: Self-pay | Admitting: *Deleted

## 2023-02-25 VITALS — BP 122/67 | HR 98 | Temp 97.4°F | Resp 20

## 2023-02-25 DIAGNOSIS — Z7901 Long term (current) use of anticoagulants: Secondary | ICD-10-CM | POA: Diagnosis not present

## 2023-02-25 DIAGNOSIS — Z5112 Encounter for antineoplastic immunotherapy: Secondary | ICD-10-CM | POA: Diagnosis not present

## 2023-02-25 DIAGNOSIS — C9110 Chronic lymphocytic leukemia of B-cell type not having achieved remission: Secondary | ICD-10-CM

## 2023-02-25 DIAGNOSIS — C8300 Small cell B-cell lymphoma, unspecified site: Secondary | ICD-10-CM

## 2023-02-25 DIAGNOSIS — D649 Anemia, unspecified: Secondary | ICD-10-CM | POA: Diagnosis not present

## 2023-02-25 DIAGNOSIS — I119 Hypertensive heart disease without heart failure: Secondary | ICD-10-CM | POA: Diagnosis not present

## 2023-02-25 DIAGNOSIS — I4891 Unspecified atrial fibrillation: Secondary | ICD-10-CM | POA: Insufficient documentation

## 2023-02-25 LAB — CBC WITH DIFFERENTIAL/PLATELET
Abs Immature Granulocytes: 0.8 10*3/uL — ABNORMAL HIGH (ref 0.00–0.07)
Basophils Absolute: 0.4 10*3/uL — ABNORMAL HIGH (ref 0.0–0.1)
Basophils Relative: 2 %
Eosinophils Absolute: 0.8 10*3/uL — ABNORMAL HIGH (ref 0.0–0.5)
Eosinophils Relative: 4 %
HCT: 41.3 % (ref 39.0–52.0)
Hemoglobin: 13.7 g/dL (ref 13.0–17.0)
Lymphocytes Relative: 35 %
Lymphs Abs: 7 10*3/uL — ABNORMAL HIGH (ref 0.7–4.0)
MCH: 33.3 pg (ref 26.0–34.0)
MCHC: 33.2 g/dL (ref 30.0–36.0)
MCV: 100.5 fL — ABNORMAL HIGH (ref 80.0–100.0)
Metamyelocytes Relative: 2 %
Monocytes Absolute: 3 10*3/uL — ABNORMAL HIGH (ref 0.1–1.0)
Monocytes Relative: 15 %
Myelocytes: 1 %
Neutro Abs: 8 10*3/uL — ABNORMAL HIGH (ref 1.7–7.7)
Neutrophils Relative %: 40 %
Platelets: 101 10*3/uL — ABNORMAL LOW (ref 150–400)
Promyelocytes Relative: 1 %
RBC: 4.11 MIL/uL — ABNORMAL LOW (ref 4.22–5.81)
RDW: 17.9 % — ABNORMAL HIGH (ref 11.5–15.5)
WBC: 20 10*3/uL — ABNORMAL HIGH (ref 4.0–10.5)
nRBC: 1.2 % — ABNORMAL HIGH (ref 0.0–0.2)

## 2023-02-25 LAB — PHOSPHORUS: Phosphorus: 4.6 mg/dL (ref 2.5–4.6)

## 2023-02-25 LAB — COMPREHENSIVE METABOLIC PANEL
ALT: 45 U/L — ABNORMAL HIGH (ref 0–44)
AST: 53 U/L — ABNORMAL HIGH (ref 15–41)
Albumin: 3.3 g/dL — ABNORMAL LOW (ref 3.5–5.0)
Alkaline Phosphatase: 162 U/L — ABNORMAL HIGH (ref 38–126)
Anion gap: 12 (ref 5–15)
BUN: 34 mg/dL — ABNORMAL HIGH (ref 8–23)
CO2: 21 mmol/L — ABNORMAL LOW (ref 22–32)
Calcium: 8.9 mg/dL (ref 8.9–10.3)
Chloride: 97 mmol/L — ABNORMAL LOW (ref 98–111)
Creatinine, Ser: 1.49 mg/dL — ABNORMAL HIGH (ref 0.61–1.24)
GFR, Estimated: 52 mL/min — ABNORMAL LOW (ref >60)
Glucose, Bld: 149 mg/dL — ABNORMAL HIGH (ref 70–99)
Potassium: 5.1 mmol/L (ref 3.5–5.1)
Sodium: 130 mmol/L — ABNORMAL LOW (ref 135–145)
Total Bilirubin: 2.7 mg/dL — ABNORMAL HIGH (ref 0.3–1.2)
Total Protein: 5.8 g/dL — ABNORMAL LOW (ref 6.5–8.1)

## 2023-02-25 LAB — URIC ACID: Uric Acid, Serum: 10 mg/dL — ABNORMAL HIGH (ref 3.7–8.6)

## 2023-02-25 LAB — MAGNESIUM: Magnesium: 2.9 mg/dL — ABNORMAL HIGH (ref 1.7–2.4)

## 2023-02-25 MED ORDER — OXYCODONE HCL 5 MG PO TABS: 5.0000 mg | ORAL_TABLET | ORAL | 0 refills | Status: DC | PRN

## 2023-02-25 NOTE — Patient Instructions (Addendum)
[facility]  Discharge Instructions  You were seen and examined today by Dr. Ellin Saba.  Your lymphoma has began to grow rapidly and needs treatment quickly. We will arrange that here in the Cancer Center.  Follow-up as scheduled.  Thank you for choosing [facility] to provide your oncology and hematology care.   To afford each patient quality time with our provider, please arrive at least 15 minutes before your scheduled appointment time. You may need to reschedule your appointment if you arrive late (10 or more minutes). Arriving late affects you and other patients whose appointments are after yours.  Also, if you miss three or more appointments without notifying the office, you may be dismissed from the clinic at the provider's discretion.    Again, thank you for choosing [facility].  Our hope is that these requests will decrease the amount of time that you wait before being seen by our physicians.   If you have a lab appointment with the Cancer Center - please note that after April 8th, all labs will be drawn in the cancer center.  You do not have to check in or register with the main entrance as you have in the past but will complete your check-in at the cancer center.            _____________________________________________________________  Should you have questions after your visit to [facility], please contact our office at [phone] and follow the prompts.  Our office hours are 8:00 a.m. to 4:30 p.m. Monday - Thursday and 8:00 a.m. to 2:30 p.m. Friday.  Please note that voicemails left after 4:00 p.m. may not be returned until the following business day.  We are closed weekends and all major holidays.  You do have access to a nurse 24-7, just call the main number to the clinic [phone] and do not press any options, hold on the line and a nurse will answer the phone.    For prescription refill  requests, have your pharmacy contact our office and allow 72 hours.    Masks are no longer required in the cancer centers. If you would like for your care team to wear a mask while they are taking care of you, please let them know. You may have one support person who is at least 64 years old accompany you for your appointments.

## 2023-02-25 NOTE — Progress Notes (Signed)
OFF PATHWAY REGIMEN - Lymphoma and CLL  No Change  Continue With Treatment as Ordered.  Original Decision Date/Time: 11/20/2017 11:04   [id number]:Ibrutinib 420 mg PO Daily D1-28 q28 Days:   A cycle is every 28 days:     Ibrutinib   **Always confirm dose/schedule in your pharmacy ordering system**  Patient Characteristics: Small Lymphocytic Lymphoma (SLL), First Line, Treatment Indicated, 17p del (-) or Unknown, Fit Patient, Age < 54, IGHV Mutation (+) Disease Type: Small Lymphocytic Lymphoma (SLL) Disease Type: Not Applicable Disease Type: Not Applicable [place] Stage: IV Line of therapy: First Line Treatment Indicated<= Treatment Indicated 17p Deletion Status: Negative Fit or Frail Patient<= Fit Patient Patient Age: Age < 31 IGHV Mutation: IGHV Mutation (+) Intent of Therapy: Non-Curative / Palliative Intent, Discussed with Patient

## 2023-02-25 NOTE — Progress Notes (Signed)
DISCONTINUE OFF PATHWAY REGIMEN - Lymphoma and CLL   [id number]:Ibrutinib 420 mg PO Daily D1-28 q28 Days:   A cycle is every 28 days:     Ibrutinib   **Always confirm dose/schedule in your pharmacy ordering system**  REASON: Disease Progression PRIOR TREATMENT: Off Pathway: Ibrutinib 420 mg PO Daily D1-28 q28 Days TREATMENT RESPONSE: Stable Disease (SD)  START ON PATHWAY REGIMEN - Lymphoma and CLL     Ramp-up cycle = 35 days:     Venetoclax      Venetoclax      Venetoclax      Venetoclax      Venetoclax    Cycle 1 through 24 = every 28 days:     Venetoclax      Rituximab-xxxx      Rituximab-xxxx   **Always confirm dose/schedule in your pharmacy ordering system**  Patient Characteristics: Small Lymphocytic Lymphoma (SLL), Treatment Indicated, Second Line Disease Type: Small Lymphocytic Lymphoma (SLL) Disease Type: Not Applicable Disease Type: Not Applicable Treatment Indicated<= Treatment Indicated Line of Therapy: Second Line Intent of Therapy: Non-Curative / Palliative Intent, Discussed with Patient

## 2023-02-25 NOTE — Telephone Encounter (Signed)
Per Dr. Ellin Saba, called patient to advise on elevated Uric Acid level, along with brief explanation or rationale.  Made him aware that Dr. Ellin Saba has sent in allopurinol to his pharmacy and would like for him to begin taking either today or tomorrow.  Verbalized understanding.

## 2023-02-26 ENCOUNTER — Other Ambulatory Visit: Payer: Self-pay | Admitting: *Deleted

## 2023-02-26 ENCOUNTER — Telehealth (INDEPENDENT_AMBULATORY_CARE_PROVIDER_SITE_OTHER): Payer: PPO | Admitting: Internal Medicine

## 2023-02-26 ENCOUNTER — Encounter: Payer: Self-pay | Admitting: Internal Medicine

## 2023-02-26 DIAGNOSIS — I4891 Unspecified atrial fibrillation: Secondary | ICD-10-CM

## 2023-02-26 DIAGNOSIS — E861 Hypovolemia: Secondary | ICD-10-CM

## 2023-02-26 DIAGNOSIS — N179 Acute kidney failure, unspecified: Secondary | ICD-10-CM

## 2023-02-26 DIAGNOSIS — R5381 Other malaise: Secondary | ICD-10-CM

## 2023-02-26 DIAGNOSIS — K5903 Drug induced constipation: Secondary | ICD-10-CM | POA: Diagnosis not present

## 2023-02-26 DIAGNOSIS — Z09 Encounter for follow-up examination after completed treatment for conditions other than malignant neoplasm: Secondary | ICD-10-CM

## 2023-02-26 DIAGNOSIS — R55 Syncope and collapse: Secondary | ICD-10-CM

## 2023-02-26 MED ORDER — ALLOPURINOL 300 MG PO TABS: 300.0000 mg | ORAL_TABLET | Freq: Every day | ORAL | 3 refills | Status: DC

## 2023-02-26 NOTE — Assessment & Plan Note (Signed)
Likely due to hypovolemia from poor p.o. intake Telmisartan and metoprolol recently held On amiodarone for A-fib Advised to maintain at least 64 ounces of fluid intake

## 2023-02-26 NOTE — Assessment & Plan Note (Signed)
Likely due to dehydration in the setting of CLL Offered home PT for better ambulation, but he prefers to wait for now He has severe pain upon ambulation-takes oxycodone as needed for pain

## 2023-02-26 NOTE — Assessment & Plan Note (Addendum)
Likely due to dehydration Last BMP reviewed Advised to maintain adequate hydration and avoid skipping any meals

## 2023-02-26 NOTE — Assessment & Plan Note (Signed)
Likely worse due to opioid pain medicine Continue Senokot-S Advised to take MiraLAX as needed for constipation

## 2023-02-26 NOTE — Assessment & Plan Note (Addendum)
Recently diagnosed during recent hospitalization On amiodarone On Eliquis for [facility] Followed by cardiology

## 2023-02-26 NOTE — Progress Notes (Signed)
Virtual Visit via Video Note   Because of Adam Reyes's co-morbid illnesses, he is at least at moderate risk for complications without adequate follow up.  This format is felt to be most appropriate for this patient at this time.  All issues noted in this document were discussed and addressed.  A limited physical exam was performed with this format.      Evaluation Performed:  Follow-up visit  Date:  02/26/2023   ID:  Adam Reyes, DOB Apr 05, 1956, MRN [id number]  Patient Location: Home Provider Location: Office/Clinic  Participants: Patient Location of Patient: Home Location of Provider: Telehealth Consent was obtain for visit to be over via telehealth. I verified that I am speaking with the correct person using two identifiers.  PCP:  Anabel Halon, MD   Chief Complaint: Hospital discharge follow-up  History of Present Illness:    Adam Reyes is a 67 y.o. male with PMH of CAD, HTN, HLD and lymphocytic lymphoma who has a video visit for follow up of recent hospitalization.  He was sent to [facility] ER from oncology clinic due to hypotension.  He states that he almost passed out while having blood draw.  He had severe fatigue and poor p.o. intake for the last few days before the admission to hospital.  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  Hospital course complicated by new onset A-fib with RVR for which he was initially started on Cardizem drip but became hypotensive.  Cardiology consulted.  He was switched to IV amiodarone and underwent DCCV the night of 8/1-2 and converted to sinus rhythm.  Received IV amiodarone and heparin in-house.  TTE with hyperdynamic LV function with LVOT obstruction but no HCM.  Discharged on p.o. amiodarone 400 mg twice daily for 1 week followed by 200 mg daily, and Eliquis for anticoagulation.  Cardiology recommended repeat echo in 1 to 2 weeks.  They have  arranged outpatient follow-up.  His blood pressure has been overall better controlled since stopping telmisartan and metoprolol.  He still has severe fatigue and pain and takes oxycodone as needed for it.  He also has constipation and was placed on Senokot-S for it.  The patient does not have symptoms concerning for COVID-19 infection (fever, chills, cough, or new shortness of breath).   Past Medical, Surgical, Social History, Allergies, and Medications have been Reviewed.  Past Medical History:  Diagnosis Date   Chronic lymphocytic leukemia (CLL), B-cell (HCC)    Small Cell Lymphoma   Coronary artery disease, non-occlusive 02/2017   Cardiac cath in setting of MI: 50 and 55% bifurcation LAD-Diag1   Enlarged lymph node    left neck   Hypertension    STEMI (ST elevation myocardial infarction) (HCC) 03/05/2017   hx/notes 03/05/2017 -likely aborted anterior STEMI with 50% bifurcation LAD-Diag1.  No PCI.  Preserved EF   Syncope due to orthostatic hypotension 04/21/2018   Past Surgical History:  Procedure Laterality Date   BIOPSY  10/10/2020   Procedure: BIOPSY;  Surgeon: Dolores Frame, MD;  [facility];  Service: Gastroenterology;;   BIOPSY  11/14/2020   Procedure: BIOPSY;  Surgeon: Dolores Frame, MD;  [facility];  Service: Gastroenterology;;   COLONOSCOPY N/A 10/11/2015   Procedure: COLONOSCOPY;  Surgeon: Malissa Hippo, MD;  [facility];  Service: Endoscopy;  Laterality: N/A;  10/11/2015   COLONOSCOPY  WITH PROPOFOL N/A 10/10/2020   Procedure: COLONOSCOPY WITH PROPOFOL;  Surgeon: Dolores Frame, MD;  [facility];  Service: Gastroenterology;  Laterality: N/A;  AM   ENTEROSCOPY N/A 11/14/2020   Procedure: push enteroscopy;  Surgeon: Dolores Frame, MD;  [facility];  Service: Gastroenterology;  Laterality: N/A;   ESOPHAGOGASTRODUODENOSCOPY (EGD) WITH PROPOFOL N/A 10/10/2020   Procedure:  ESOPHAGOGASTRODUODENOSCOPY (EGD) WITH PROPOFOL;  Surgeon: Dolores Frame, MD;  [facility];  Service: Gastroenterology;  Laterality: N/A;   ESOPHAGOGASTRODUODENOSCOPY (EGD) WITH PROPOFOL N/A 11/14/2020   Procedure: ESOPHAGOGASTRODUODENOSCOPY (EGD) WITH PROPOFOL;  Surgeon: Dolores Frame, MD;  [facility];  Service: Gastroenterology;  Laterality: N/A;  12:00   GIVENS CAPSULE STUDY N/A 11/07/2020   Procedure: GIVENS CAPSULE STUDY;  Surgeon: Dolores Frame, MD;  [facility];  Service: Gastroenterology;  Laterality: N/A;  7:30 am   Graded Exercise Tolerance Test (GXT/ETT)  05/2017   10.7 METs (9: 25 min.  Reached 103% max predicted heart rate).  No EKG findings to suggest coronary ischemia.  Negative, low risk GXT   HEMOSTASIS CLIP PLACEMENT  11/14/2020   Procedure: HEMOSTASIS CLIP PLACEMENT;  Surgeon: Dolores Frame, MD;  [facility];  Service: Gastroenterology;;  small bowel nodule   LEFT HEART CATH AND CORONARY ANGIOGRAPHY N/A 03/05/2017   Procedure: LEFT HEART CATH AND CORONARY ANGIOGRAPHY;  Surgeon: Marykay Lex, MD;  Location: [facility] IN[facility] CV [facility];  Service: Cardiovascular: pLAD-Diag1 50-55% (non-flow-limiiting).  EF ~50-55%.  although bifurcation lesion was presumed Culprit - no PCI (not flow limiting). - Med Rx.   MASS BIOPSY Left 06/27/2015   Procedure: OPEN LEFT NECK BIOPSY ;  Surgeon: Newman Pies, MD;  Location: [facility];  Service: ENT;  Laterality: Left;   POLYPECTOMY  10/10/2020   Procedure: POLYPECTOMY;  Surgeon: Dolores Frame, MD;  [facility];  Service: Gastroenterology;;   REFRACTIVE SURGERY Bilateral      No outpatient medications have been marked as taking for the 02/26/23 encounter (Video Visit) with Anabel Halon, MD.     Allergies:   Patient has no known allergies.   ROS:   Please see the history of present illness.     All other systems reviewed and  are negative.   Labs/Other Tests and Data Reviewed:    Recent Labs: 02/14/2023: TSH 1.380 02/25/2023: ALT 45; BUN 34; Creatinine, Ser 1.49; Hemoglobin 13.7; Magnesium 2.9; Platelets 101; Potassium 5.1; Sodium 130   Recent Lipid Panel Lab Results  Component Value Date/Time   CHOL 118 05/29/2022 09:04 AM   TRIG 130 05/29/2022 09:04 AM   HDL 30 (L) 05/29/2022 09:04 AM   CHOLHDL 3.9 05/29/2022 09:04 AM   CHOLHDL 4.0 09/27/2021 01:18 PM   LDLCALC 65 05/29/2022 09:04 AM   LDLCALC 38 08/31/2020 03:37 PM    Wt Readings from Last 3 Encounters:  02/18/23 278 lb (126.1 kg)  02/12/23 268 lb (121.6 kg)  01/30/23 270 lb (122.5 kg)     Objective:    Vital Signs:  There were no vitals taken for this visit.   VITAL SIGNS:  reviewed GEN:  no acute distress EYES:  sclerae anicteric, EOMI - Extraocular Movements Intact RESPIRATORY:  normal respiratory effort, symmetric expansion NEURO:  alert and oriented x 3, no obvious focal deficit PSYCH:  normal affect  ASSESSMENT & PLAN:    Atrial fibrillation with RVR (HCC) Recently diagnosed during recent hospitalization On amiodarone On  Eliquis for [facility] Followed by cardiology  Hypotension Likely due to hypovolemia from poor p.o. intake Telmisartan and metoprolol recently held On amiodarone for A-fib Advised to maintain at least 64 ounces of fluid intake  AKI (acute kidney injury) (HCC) Likely due to dehydration Last BMP reviewed Advised to maintain adequate hydration and avoid skipping any meals  Physical deconditioning Likely due to dehydration in the setting of CLL Offered home PT for better ambulation, but he prefers to wait for now He has severe pain upon ambulation-takes oxycodone as needed for pain  Syncope and collapse Had likely vasovagal syncope during phlebotomy in the setting of hypotension from hypovolemia Needs to maintain adequate hydration  Hospital discharge follow-up Hospital chart reviewed, including discharge  summary Medications reconciled and reviewed with the patient in detail  Drug-induced constipation Likely worse due to opioid pain medicine Continue Senokot-S Advised to take MiraLAX as needed for constipation   I discussed the assessment and treatment plan with the patient. The patient was provided an opportunity to ask questions, and all were answered. The patient agreed with the plan and demonstrated an understanding of the instructions.   The patient was advised to call back or seek an in-person evaluation if the symptoms worsen or if the condition fails to improve as anticipated.  The above assessment and management plan was discussed with the patient. The patient verbalized understanding of and has agreed to the management plan.   Medication Adjustments/Labs and Tests Ordered: Current medicines are reviewed at length with the patient today.  Concerns regarding medicines are outlined above.   Tests Ordered: No orders of the defined types were placed in this encounter.   Medication Changes: No orders of the defined types were placed in this encounter.    Note: This dictation was prepared with Dragon dictation along with smaller phrase technology. Similar sounding words can be transcribed inadequately or may not be corrected upon review. Any transcriptional errors that result from this process are unintentional.      Disposition:  Follow up  Signed, Anabel Halon, MD  02/26/2023 9:35 AM     [facility]

## 2023-02-26 NOTE — Assessment & Plan Note (Signed)
Hospital chart reviewed, including discharge summary Medications reconciled and reviewed with the patient in detail 

## 2023-02-26 NOTE — Patient Instructions (Signed)
Please continue to take medications as prescribed.  Please continue to maintain at least 64 ounces of fluid intake in a day and avoid skipping any meals.  Please continue to ambulate short distances in the home.

## 2023-02-26 NOTE — Assessment & Plan Note (Signed)
Had likely vasovagal syncope during phlebotomy in the setting of hypotension from hypovolemia Needs to maintain adequate hydration

## 2023-02-27 ENCOUNTER — Inpatient Hospital Stay: Payer: PPO

## 2023-02-27 ENCOUNTER — Other Ambulatory Visit: Payer: Self-pay

## 2023-02-27 VITALS — BP 115/32 | HR 84 | Temp 97.4°F | Resp 19

## 2023-02-27 DIAGNOSIS — Z5112 Encounter for antineoplastic immunotherapy: Secondary | ICD-10-CM | POA: Diagnosis not present

## 2023-02-27 DIAGNOSIS — R7989 Other specified abnormal findings of blood chemistry: Secondary | ICD-10-CM

## 2023-02-27 DIAGNOSIS — C9110 Chronic lymphocytic leukemia of B-cell type not having achieved remission: Secondary | ICD-10-CM

## 2023-02-27 LAB — COMPREHENSIVE METABOLIC PANEL
ALT: 45 U/L — ABNORMAL HIGH (ref 0–44)
AST: 48 U/L — ABNORMAL HIGH (ref 15–41)
Albumin: 2.9 g/dL — ABNORMAL LOW (ref 3.5–5.0)
Alkaline Phosphatase: 157 U/L — ABNORMAL HIGH (ref 38–126)
Anion gap: 10 (ref 5–15)
BUN: 43 mg/dL — ABNORMAL HIGH (ref 8–23)
CO2: 22 mmol/L (ref 22–32)
Calcium: 8.6 mg/dL — ABNORMAL LOW (ref 8.9–10.3)
Chloride: 97 mmol/L — ABNORMAL LOW (ref 98–111)
Creatinine, Ser: 1.45 mg/dL — ABNORMAL HIGH (ref 0.61–1.24)
GFR, Estimated: 53 mL/min — ABNORMAL LOW (ref >60)
Glucose, Bld: 113 mg/dL — ABNORMAL HIGH (ref 70–99)
Potassium: 4.7 mmol/L (ref 3.5–5.1)
Sodium: 129 mmol/L — ABNORMAL LOW (ref 135–145)
Total Bilirubin: 2.4 mg/dL — ABNORMAL HIGH (ref 0.3–1.2)
Total Protein: 5.1 g/dL — ABNORMAL LOW (ref 6.5–8.1)

## 2023-02-27 LAB — URIC ACID: Uric Acid, Serum: 10.3 mg/dL — ABNORMAL HIGH (ref 3.7–8.6)

## 2023-02-27 LAB — PHOSPHORUS: Phosphorus: 4.3 mg/dL (ref 2.5–4.6)

## 2023-02-27 LAB — LACTATE DEHYDROGENASE: LDH: 220 U/L — ABNORMAL HIGH (ref 98–192)

## 2023-02-27 LAB — MAGNESIUM: Magnesium: 2.8 mg/dL — ABNORMAL HIGH (ref 1.7–2.4)

## 2023-02-27 MED ORDER — MEPERIDINE HCL 50 MG/ML IJ SOLN
25.0000 mg | Freq: Once | INTRAMUSCULAR | Status: AC
  Administered 2023-02-27: 25 mg via INTRAVENOUS

## 2023-02-27 MED ORDER — SODIUM CHLORIDE 0.9 % IV SOLN
20.0000 mg | Freq: Once | INTRAVENOUS | Status: AC
  Administered 2023-02-27: 20 mg via INTRAVENOUS
  Filled 2023-02-27: qty 2

## 2023-02-27 MED ORDER — SODIUM CHLORIDE 0.9 % IV SOLN
6.0000 mg | Freq: Once | INTRAVENOUS | Status: AC
  Administered 2023-02-27: 6 mg via INTRAVENOUS
  Filled 2023-02-27: qty 4

## 2023-02-27 MED ORDER — MEPERIDINE HCL 50 MG/ML IJ SOLN
25.0000 mg | Freq: Once | INTRAMUSCULAR | Status: AC
  Administered 2023-02-27: 25 mg via INTRAVENOUS
  Filled 2023-02-27: qty 1

## 2023-02-27 MED ORDER — SODIUM CHLORIDE 0.9 % IV SOLN: Freq: Once | INTRAVENOUS | Status: AC

## 2023-02-27 MED ORDER — SODIUM CHLORIDE 0.9 % IV SOLN
100.0000 mg | Freq: Once | INTRAVENOUS | Status: AC
  Administered 2023-02-27: 100 mg via INTRAVENOUS
  Filled 2023-02-27: qty 4

## 2023-02-27 MED ORDER — MONTELUKAST SODIUM 10 MG PO TABS
10.0000 mg | ORAL_TABLET | Freq: Once | ORAL | Status: AC
  Administered 2023-02-27: 10 mg via ORAL
  Filled 2023-02-27: qty 1

## 2023-02-27 MED ORDER — DIPHENHYDRAMINE HCL 50 MG/ML IJ SOLN
50.0000 mg | Freq: Once | INTRAMUSCULAR | Status: AC
  Administered 2023-02-27: 50 mg via INTRAVENOUS
  Filled 2023-02-27: qty 1

## 2023-02-27 MED ORDER — HEPARIN SOD (PORK) LOCK FLUSH 100 UNIT/ML IV SOLN: 500.0000 [IU] | Freq: Once | INTRAVENOUS | Status: DC | PRN

## 2023-02-27 MED ORDER — PROCHLORPERAZINE MALEATE 10 MG PO TABS: 10.0000 mg | ORAL_TABLET | Freq: Four times a day (QID) | ORAL | 2 refills | Status: DC | PRN

## 2023-02-27 MED ORDER — FAMOTIDINE IN NACL 20-0.9 MG/50ML-% IV SOLN
20.0000 mg | Freq: Once | INTRAVENOUS | Status: AC
  Administered 2023-02-27: 20 mg via INTRAVENOUS
  Filled 2023-02-27: qty 50

## 2023-02-27 MED ORDER — SODIUM CHLORIDE 0.9 % IV SOLN
500.0000 mg | Freq: Once | INTRAVENOUS | Status: AC
  Administered 2023-02-27: 500 mg via INTRAVENOUS
  Filled 2023-02-27: qty 500

## 2023-02-27 MED ORDER — MEPERIDINE HCL 50 MG/ML IJ SOLN
25.0000 mg | Freq: Once | INTRAMUSCULAR | Status: AC
  Filled 2023-02-27: qty 1

## 2023-02-27 MED ORDER — SODIUM CHLORIDE 0.9% FLUSH: 10.0000 mL | INTRAVENOUS | Status: DC | PRN

## 2023-02-27 MED ORDER — ACETAMINOPHEN 325 MG PO TABS
650.0000 mg | ORAL_TABLET | Freq: Once | ORAL | Status: AC
  Administered 2023-02-27: 650 mg via ORAL
  Filled 2023-02-27: qty 2

## 2023-02-27 MED ORDER — MEPERIDINE HCL 25 MG/ML IJ SOLN: 25.0000 mg | INTRAMUSCULAR | Status: DC

## 2023-02-27 MED FILL — Dexamethasone Sodium Phosphate Inj 100 MG/10ML: INTRAMUSCULAR | Qty: 2 | Status: AC

## 2023-02-27 MED FILL — Obinutuzumab Soln for IV Infusion 1000 MG/40ML (25 MG/ML): INTRAVENOUS | Qty: 36 | Status: AC

## 2023-02-27 NOTE — Progress Notes (Signed)
Pharmacist Chemotherapy Monitoring - Initial Assessment    Anticipated start date: 02/27/23    The following has been reviewed per standard work regarding the patient's treatment regimen: The patient's diagnosis, treatment plan and drug doses, and organ/hematologic function Lab orders and baseline tests specific to treatment regimen  The treatment plan start date, drug sequencing, and pre-medications Prior authorization status  Patient's documented medication list, including drug-drug interaction screen and prescriptions for anti-emetics and supportive care specific to the treatment regimen The drug concentrations, fluid compatibility, administration routes, and timing of the medications to be used The patient's access for treatment and lifetime cumulative dose history, if applicable  The patient's medication allergies and previous infusion related reactions, if applicable   Changes made to treatment plan:  N/A  Follow up needed:  N/A   Stephens Shire, [facility], 02/27/2023  8:26 AM

## 2023-02-27 NOTE — Patient Instructions (Signed)
[facility] [place]  Discharge Instructions: Thank you for choosing [facility] to provide your oncology and hematology care.  If you have a lab appointment with the Cancer Center - please note that after April 8th, 2024, all labs will be drawn in the cancer center.  You do not have to check in or register with the main entrance as you have in the past but will complete your check-in in the cancer center.  Wear comfortable clothing and clothing appropriate for easy access to any Portacath or PICC line.   We strive to give you quality time with your provider. You may need to reschedule your appointment if you arrive late (15 or more minutes).  Arriving late affects you and other patients whose appointments are after yours.  Also, if you miss three or more appointments without notifying the office, you may be dismissed from the clinic at the provider's discretion.      For prescription refill requests, have your pharmacy contact our office and allow 72 hours for refills to be completed.    Today you received the following chemotherapy and/or immunotherapy agents Gazyva.  Obinutuzumab Injection What is this medication? OBINUTUZUMAB (OH bi nue TOOZ ue mab) treats leukemia and lymphoma. It works by blocking a protein that causes cancer cells to grow and multiply. This helps to slow or stop the spread of cancer cells. It is a monoclonal antibody. This medicine may be used for other purposes; ask your health care provider or pharmacist if you have questions. COMMON BRAND NAME(S): GAZYVA What should I tell my care team before I take this medication? They need to know if you have any of these conditions: Heart disease Infection, especially a viral infection, such as hepatitis B Lung or breathing disease Take medications that treat or prevent blood clots An unusual or allergic reaction to obinutuzumab, other medications, foods, dyes, or preservatives Pregnant or trying to  get pregnant Breastfeeding How should I use this medication? This medication is for infusion into a vein. It is given by a care team in a hospital or clinic setting. Talk to your care team about the use of this medication in children. Special care may be needed. Overdosage: If you think you have taken too much of this medicine contact a poison control center or emergency room at once. NOTE: This medicine is only for you. Do not share this medicine with others. What if I miss a dose? Keep appointments for follow-up doses as directed. It is important not to miss your dose. Call your care team if you are unable to keep an appointment. What may interact with this medication? Live virus vaccines This list may not describe all possible interactions. Give your health care provider a list of all the medicines, herbs, non-prescription drugs, or dietary supplements you use. Also tell them if you smoke, drink alcohol, or use illegal drugs. Some items may interact with your medicine. What should I watch for while using this medication? Report any side effects that you notice during your treatment right away, such as changes in your breathing, fever, chills, dizziness or lightheadedness. These effects are more common with the first dose. Visit your care team for checks on your progress. You will need to have regular blood work. Report any other side effects. The side effects of this medication can continue after you finish your treatment. Continue your course of treatment even though you feel ill unless your care team tells you to stop. Call your   care team for advice if you get a fever, chills or sore throat, or other symptoms of a cold or flu. Do not treat yourself. This medication decreases your body's ability to fight infections. Try to avoid being around people who are sick. This medication may increase your risk to bruise or bleed. Call your care team if you notice any unusual bleeding. Do not become  pregnant while taking this medication or for 6 months after stopping it. Inform your care team if you wish to become pregnant or think you might be pregnant. There is a potential for serious side effects to an unborn child. Talk to your care team or pharmacist for more information. Do not breast-feed an infant while taking this medication or for 6 months after stopping it. What side effects may I notice from receiving this medication? Side effects that you should report to your care team as soon as possible: Allergic reactions--skin rash, itching, hives, swelling of the face, lips, tongue, or throat Bleeding--bloody or black, tar-like stools, vomiting blood or brown material that looks like coffee grounds, red or dark brown urine, small red or purple spots on skin, unusual bruising or bleeding Blood clot--pain, swelling, or warmth in the leg, shortness of breath, chest pain Dizziness, loss of balance or coordination, confusion or trouble speaking Infection--fever, chills, cough, sore throat, wounds that don't heal, pain or trouble when passing urine, general feeling of discomfort or being unwell Infusion reactions--chest pain, shortness of breath or trouble breathing, feeling faint or lightheaded Liver injury--right upper belly pain, loss of appetite, nausea, light-colored stool, dark yellow or brown urine, yellowing skin or eyes, unusual weakness or fatigue Tumor lysis syndrome (TLS)--nausea, vomiting, diarrhea, decrease in the amount of urine, dark urine, unusual weakness or fatigue, confusion, muscle pain or cramps, fast or irregular heartbeat, joint pain Side effects that usually do not require medical attention (report to your care team if they continue or are bothersome): Bone, joint, or muscle pain Constipation Diarrhea Fatigue Runny or stuffy nose Sore throat This list may not describe all possible side effects. Call your doctor for medical advice about side effects. You may report side  effects to FDA at 1-800-FDA-1088. Where should I keep my medication? This medication is only given in a hospital or clinic and will not be stored at home. NOTE: This sheet is a summary. It may not cover all possible information. If you have questions about this medicine, talk to your doctor, pharmacist, or health care provider.  2024 Elsevier/Gold Standard (2021-11-21 00:00:00)        To help prevent nausea and vomiting after your treatment, we encourage you to take your nausea medication as directed.  BELOW ARE SYMPTOMS THAT SHOULD BE REPORTED IMMEDIATELY: *FEVER GREATER THAN 100.4 F (38 C) OR HIGHER *CHILLS OR SWEATING *NAUSEA AND VOMITING THAT IS NOT CONTROLLED WITH YOUR NAUSEA MEDICATION *UNUSUAL SHORTNESS OF BREATH *UNUSUAL BRUISING OR BLEEDING *URINARY PROBLEMS (pain or burning when urinating, or frequent urination) *BOWEL PROBLEMS (unusual diarrhea, constipation, pain near the anus) TENDERNESS IN MOUTH AND THROAT WITH OR WITHOUT PRESENCE OF ULCERS (sore throat, sores in mouth, or a toothache) UNUSUAL RASH, SWELLING OR PAIN  UNUSUAL VAGINAL DISCHARGE OR ITCHING   Items with * indicate a potential emergency and should be followed up as soon as possible or go to the Emergency Department if any problems should occur.  Please show the CHEMOTHERAPY ALERT CARD or IMMUNOTHERAPY ALERT CARD at check-in to the Emergency Department and triage nurse.  Should you have   questions after your visit or need to cancel or reschedule your appointment, please contact [facility] AT  [phone]  and follow the prompts.  Office hours are 8:00 a.m. to 4:30 p.m. Monday - Friday. Please note that voicemails left after 4:00 p.m. may not be returned until the following business day.  We are closed weekends and major holidays. You have access to a nurse at all times for urgent questions. Please call the main number to the clinic [phone] and follow the prompts.  For any non-urgent  questions, you may also contact your provider using MyChart. We now offer e-Visits for anyone 18 and older to request care online for non-urgent symptoms. For details visit [email or web address].   Also download the MyChart app! Go to the app store, search "MyChart", open the app, select [facility], and log in with your MyChart username and password.   

## 2023-02-27 NOTE — Progress Notes (Signed)
12:23 pm. Patient requesting more blankets due to patient states, " he is cold"  per CNA .  Upon entering room patient shivering and has complaints of freezing. Gazyva stopped at 12:23pm. Normal saline bolus initiated at 257mls/ hr. Vital signs see flowsheet. 12:26 pm Dr. Ellin Saba at the bedside. Verbal order received to give 25 mg Demerol IV push now.  12:33 pm Vital signs see flowsheet.  12:36 pm 25 mg of Demerol given IV push per Dr. Marice Potter verbal order.

## 2023-02-27 NOTE — Progress Notes (Signed)
Hypersensitivity Reaction note  Date of event: 02/27/23 Time of event: 1223 Generic name of drug involved: Obinutuzumab Name of provider notified of the hypersensitivity reaction: Doreatha Massed, MD Was agent that likely caused hypersensitivity reaction added to Allergies List within EMR? yes Chain of events including reaction signs/symptoms, treatment administered, and outcome (e.g., drug resumed; drug discontinued; sent to Emergency Department; etc.)   1223:  Patient's spouse requested more blankets because patient was cold.  CNA alerted Dicky Doe, RN of this.  RN at bedside and patient c/o being very cold and was having rigors.  Breathing was also very labored.  Oxygen sats WNL.  Gazyva immediately stopped.  NS started at 200 mL/hr.  Vital signs obtained (see flowsheet).     1226:  Dr. Ellin Saba at bedside.  Verbal order to give Demerol 25 mg x one dose per IV push.   1233:  Vitals obtained again (see flowsheet).   1236:  A second dose of Demerol 25 mg was given via IV push per Dr. Ellin Saba for rigors.   1255: Rigors have subsided.  Patient's breathing is still heavy and patient sounds tight.  MD made aware.  Lungs sounded clear on evaluation by RN.  Wheezing sounds more upper airway.  MD agreed.  Gazyva restarted per Dr. Ellin Saba.  Peggye Pitt, RN 02/27/2023 2:33 PM

## 2023-02-27 NOTE — Progress Notes (Signed)
Patient presents today for chemotherapy infusion.  Patient is in satisfactory condition with no new complaints voiced.  Vital signs are stable.  Labs from 02/25/2023 reviewed by Dr. Ellin Saba.  Bilirubin was 2.7.  MD aware.  We will give an additional 1 L of NS over 2 hours today per Dr. Ellin Saba.  Additional labs will be drawn today. Patient will also need labs again tomorrow per Dr. Ellin Saba and possible fluids.  We will proceed with treatment per MD orders.    1223:  Patient's spouse requested more blankets because patient was cold.  CNA alerted Dicky Doe, RN of this.  RN at bedside and patient c/o being very cold and was having rigors.  Breathing was also very labored.  Oxygen sats WNL.  Gazyva immediately stopped.  NS started at 200 mL/hr.  Vital signs obtained (see flowsheet).    1226:  Dr. Ellin Saba at bedside.  Verbal order to give Demerol 25 mg x one dose per IV push.  1233:  Vitals obtained again (see flowsheet).  1236:  A second dose of Demerol 25 mg was given via IV push per Dr. Ellin Saba for rigors.  1255: Rigors have subsided.  Patient's breathing is still heavy and patient sounds tight.  MD made aware.  Lungs sounded clear on evaluation by RN.  Wheezing sounds more upper airway.  MD agreed.  Gazyva restarted per Dr. Ellin Saba.  Patient tolerated the remainder of the  treatment well with no complaints voiced.  Patient left via wheelchair with wife in stable condition.  Vital signs stable at discharge.  Follow up as scheduled.

## 2023-02-28 ENCOUNTER — Other Ambulatory Visit: Payer: Self-pay

## 2023-02-28 ENCOUNTER — Inpatient Hospital Stay: Payer: PPO

## 2023-02-28 ENCOUNTER — Encounter (HOSPITAL_COMMUNITY): Payer: Self-pay | Admitting: Internal Medicine

## 2023-02-28 ENCOUNTER — Telehealth (HOSPITAL_BASED_OUTPATIENT_CLINIC_OR_DEPARTMENT_OTHER): Payer: Self-pay | Admitting: *Deleted

## 2023-02-28 ENCOUNTER — Inpatient Hospital Stay (HOSPITAL_COMMUNITY): Payer: PPO

## 2023-02-28 ENCOUNTER — Inpatient Hospital Stay (HOSPITAL_COMMUNITY)
Admission: RE | Admit: 2023-02-28 | Discharge: 2023-03-02 | DRG: 683 | Disposition: A | Discharge status: Home / Self Care | Payer: PPO | Source: Ambulatory Visit | Attending: Internal Medicine | Admitting: Internal Medicine

## 2023-02-28 ENCOUNTER — Observation Stay (HOSPITAL_COMMUNITY): Payer: PPO

## 2023-02-28 ENCOUNTER — Encounter (HOSPITAL_COMMUNITY): Payer: Self-pay

## 2023-02-28 VITALS — BP 119/60 | HR 80 | Temp 97.8°F | Resp 18

## 2023-02-28 DIAGNOSIS — R918 Other nonspecific abnormal finding of lung field: Secondary | ICD-10-CM | POA: Diagnosis not present

## 2023-02-28 DIAGNOSIS — R748 Abnormal levels of other serum enzymes: Secondary | ICD-10-CM | POA: Diagnosis not present

## 2023-02-28 DIAGNOSIS — I4891 Unspecified atrial fibrillation: Secondary | ICD-10-CM | POA: Diagnosis present

## 2023-02-28 DIAGNOSIS — E883 Tumor lysis syndrome: Secondary | ICD-10-CM | POA: Diagnosis not present

## 2023-02-28 DIAGNOSIS — Z6835 Body mass index (BMI) 35.0-35.9, adult: Secondary | ICD-10-CM | POA: Diagnosis not present

## 2023-02-28 DIAGNOSIS — R17 Unspecified jaundice: Secondary | ICD-10-CM | POA: Diagnosis not present

## 2023-02-28 DIAGNOSIS — Z7901 Long term (current) use of anticoagulants: Secondary | ICD-10-CM | POA: Diagnosis not present

## 2023-02-28 DIAGNOSIS — Z9221 Personal history of antineoplastic chemotherapy: Secondary | ICD-10-CM | POA: Diagnosis not present

## 2023-02-28 DIAGNOSIS — I110 Hypertensive heart disease with heart failure: Secondary | ICD-10-CM | POA: Diagnosis present

## 2023-02-28 DIAGNOSIS — E860 Dehydration: Secondary | ICD-10-CM | POA: Diagnosis not present

## 2023-02-28 DIAGNOSIS — I2510 Atherosclerotic heart disease of native coronary artery without angina pectoris: Secondary | ICD-10-CM | POA: Diagnosis not present

## 2023-02-28 DIAGNOSIS — Z87891 Personal history of nicotine dependence: Secondary | ICD-10-CM | POA: Diagnosis not present

## 2023-02-28 DIAGNOSIS — I252 Old myocardial infarction: Secondary | ICD-10-CM

## 2023-02-28 DIAGNOSIS — Z833 Family history of diabetes mellitus: Secondary | ICD-10-CM

## 2023-02-28 DIAGNOSIS — R0600 Dyspnea, unspecified: Secondary | ICD-10-CM | POA: Diagnosis not present

## 2023-02-28 DIAGNOSIS — R0989 Other specified symptoms and signs involving the circulatory and respiratory systems: Secondary | ICD-10-CM | POA: Diagnosis not present

## 2023-02-28 DIAGNOSIS — I5032 Chronic diastolic (congestive) heart failure: Secondary | ICD-10-CM | POA: Diagnosis present

## 2023-02-28 DIAGNOSIS — E785 Hyperlipidemia, unspecified: Secondary | ICD-10-CM | POA: Diagnosis present

## 2023-02-28 DIAGNOSIS — E872 Acidosis, unspecified: Secondary | ICD-10-CM | POA: Diagnosis present

## 2023-02-28 DIAGNOSIS — C9110 Chronic lymphocytic leukemia of B-cell type not having achieved remission: Secondary | ICD-10-CM | POA: Diagnosis present

## 2023-02-28 DIAGNOSIS — E876 Hypokalemia: Secondary | ICD-10-CM | POA: Diagnosis not present

## 2023-02-28 DIAGNOSIS — I700 Atherosclerosis of aorta: Secondary | ICD-10-CM | POA: Diagnosis not present

## 2023-02-28 DIAGNOSIS — E669 Obesity, unspecified: Secondary | ICD-10-CM | POA: Diagnosis not present

## 2023-02-28 DIAGNOSIS — Z79899 Other long term (current) drug therapy: Secondary | ICD-10-CM

## 2023-02-28 DIAGNOSIS — I10 Essential (primary) hypertension: Secondary | ICD-10-CM | POA: Diagnosis present

## 2023-02-28 DIAGNOSIS — R7989 Other specified abnormal findings of blood chemistry: Secondary | ICD-10-CM

## 2023-02-28 DIAGNOSIS — N179 Acute kidney failure, unspecified: Secondary | ICD-10-CM | POA: Diagnosis not present

## 2023-02-28 DIAGNOSIS — E871 Hypo-osmolality and hyponatremia: Secondary | ICD-10-CM | POA: Diagnosis not present

## 2023-02-28 DIAGNOSIS — R0602 Shortness of breath: Secondary | ICD-10-CM | POA: Diagnosis not present

## 2023-02-28 DIAGNOSIS — I517 Cardiomegaly: Secondary | ICD-10-CM | POA: Diagnosis not present

## 2023-02-28 DIAGNOSIS — J9811 Atelectasis: Secondary | ICD-10-CM | POA: Diagnosis not present

## 2023-02-28 LAB — COMPREHENSIVE METABOLIC PANEL
ALT: 46 U/L — ABNORMAL HIGH (ref 0–44)
AST: 67 U/L — ABNORMAL HIGH (ref 15–41)
Albumin: 2.8 g/dL — ABNORMAL LOW (ref 3.5–5.0)
Alkaline Phosphatase: 145 U/L — ABNORMAL HIGH (ref 38–126)
Anion gap: 11 (ref 5–15)
BUN: 57 mg/dL — ABNORMAL HIGH (ref 8–23)
CO2: 18 mmol/L — ABNORMAL LOW (ref 22–32)
Calcium: 8.1 mg/dL — ABNORMAL LOW (ref 8.9–10.3)
Chloride: 99 mmol/L (ref 98–111)
Creatinine, Ser: 1.48 mg/dL — ABNORMAL HIGH (ref 0.61–1.24)
GFR, Estimated: 52 mL/min — ABNORMAL LOW (ref >60)
Glucose, Bld: 173 mg/dL — ABNORMAL HIGH (ref 70–99)
Potassium: 4.7 mmol/L (ref 3.5–5.1)
Sodium: 128 mmol/L — ABNORMAL LOW (ref 135–145)
Total Bilirubin: 2.3 mg/dL — ABNORMAL HIGH (ref 0.3–1.2)
Total Protein: 4.9 g/dL — ABNORMAL LOW (ref 6.5–8.1)

## 2023-02-28 LAB — URIC ACID
Uric Acid, Serum: 6.2 mg/dL (ref 3.7–8.6)
Uric Acid, Serum: 4.8 mg/dL (ref 3.7–8.6)

## 2023-02-28 LAB — BASIC METABOLIC PANEL
Anion gap: 17 — ABNORMAL HIGH (ref 5–15)
BUN: 58 mg/dL — ABNORMAL HIGH (ref 8–23)
CO2: 15 mmol/L — ABNORMAL LOW (ref 22–32)
Calcium: 7.6 mg/dL — ABNORMAL LOW (ref 8.9–10.3)
Chloride: 97 mmol/L — ABNORMAL LOW (ref 98–111)
Creatinine, Ser: 1.54 mg/dL — ABNORMAL HIGH (ref 0.61–1.24)
GFR, Estimated: 50 mL/min — ABNORMAL LOW (ref >60)
Glucose, Bld: 186 mg/dL — ABNORMAL HIGH (ref 70–99)
Potassium: 4.8 mmol/L (ref 3.5–5.1)
Sodium: 129 mmol/L — ABNORMAL LOW (ref 135–145)

## 2023-02-28 LAB — MAGNESIUM: Magnesium: 2.9 mg/dL — ABNORMAL HIGH (ref 1.7–2.4)

## 2023-02-28 LAB — PHOSPHORUS
Phosphorus: 5.4 mg/dL — ABNORMAL HIGH (ref 2.5–4.6)
Phosphorus: 5.2 mg/dL — ABNORMAL HIGH (ref 2.5–4.6)

## 2023-02-28 MED ORDER — SODIUM CHLORIDE 0.9 % IV SOLN
20.0000 mg | Freq: Once | INTRAVENOUS | Status: AC
  Administered 2023-02-28: 20 mg via INTRAVENOUS
  Filled 2023-02-28: qty 2

## 2023-02-28 MED ORDER — AMIODARONE HCL 200 MG PO TABS
200.0000 mg | ORAL_TABLET | Freq: Every day | ORAL | Status: DC
  Administered 2023-03-01 – 2023-03-02 (×2): 200 mg via ORAL
  Filled 2023-02-28 (×3): qty 1

## 2023-02-28 MED ORDER — SODIUM CHLORIDE 0.9 % IV SOLN
3.0000 mg | Freq: Once | INTRAVENOUS | Status: AC
  Administered 2023-02-28: 3 mg via INTRAVENOUS
  Filled 2023-02-28: qty 2

## 2023-02-28 MED ORDER — ACETAMINOPHEN 650 MG RE SUPP: 650.0000 mg | Freq: Four times a day (QID) | RECTAL | Status: DC | PRN

## 2023-02-28 MED ORDER — APIXABAN 5 MG PO TABS
5.0000 mg | ORAL_TABLET | Freq: Two times a day (BID) | ORAL | Status: DC
  Administered 2023-02-28 – 2023-03-02 (×4): 5 mg via ORAL
  Filled 2023-02-28 (×4): qty 1

## 2023-02-28 MED ORDER — ACETAMINOPHEN 325 MG PO TABS
650.0000 mg | ORAL_TABLET | Freq: Four times a day (QID) | ORAL | Status: DC | PRN
  Administered 2023-03-01 – 2023-03-02 (×3): 650 mg via ORAL
  Filled 2023-02-28 (×4): qty 2

## 2023-02-28 MED ORDER — ALBUTEROL SULFATE (2.5 MG/3ML) 0.083% IN NEBU: 2.5000 mg | INHALATION_SOLUTION | RESPIRATORY_TRACT | Status: DC | PRN

## 2023-02-28 MED ORDER — FUROSEMIDE 10 MG/ML IJ SOLN
40.0000 mg | Freq: Once | INTRAMUSCULAR | Status: AC
  Administered 2023-03-01: 40 mg via INTRAVENOUS
  Filled 2023-02-28: qty 4

## 2023-02-28 MED ORDER — SODIUM BICARBONATE 8.4 % IV SOLN
INTRAVENOUS | Status: AC
  Filled 2023-02-28: qty 150

## 2023-02-28 MED ORDER — MONTELUKAST SODIUM 10 MG PO TABS
10.0000 mg | ORAL_TABLET | Freq: Once | ORAL | Status: AC
  Administered 2023-02-28: 10 mg via ORAL
  Filled 2023-02-28: qty 1

## 2023-02-28 MED ORDER — SODIUM CHLORIDE 0.9 % IV SOLN: Freq: Once | INTRAVENOUS | Status: AC

## 2023-02-28 MED ORDER — FAMOTIDINE IN NACL 20-0.9 MG/50ML-% IV SOLN
20.0000 mg | Freq: Once | INTRAVENOUS | Status: AC
  Administered 2023-02-28: 20 mg via INTRAVENOUS
  Filled 2023-02-28: qty 50

## 2023-02-28 MED ORDER — SODIUM CHLORIDE 0.9 % IV SOLN
900.0000 mg | Freq: Once | INTRAVENOUS | Status: AC
  Administered 2023-02-28: 900 mg via INTRAVENOUS
  Filled 2023-02-28: qty 36

## 2023-02-28 MED ORDER — ACETAMINOPHEN 325 MG PO TABS
650.0000 mg | ORAL_TABLET | Freq: Once | ORAL | Status: AC
  Administered 2023-02-28: 650 mg via ORAL
  Filled 2023-02-28: qty 2

## 2023-02-28 MED ORDER — SODIUM CHLORIDE 0.9 % IV SOLN: INTRAVENOUS | Status: DC

## 2023-02-28 MED ORDER — STERILE WATER FOR INJECTION IV SOLN
INTRAVENOUS | Status: DC
  Filled 2023-02-28 (×5): qty 1000

## 2023-02-28 MED ORDER — DIPHENHYDRAMINE HCL 50 MG/ML IJ SOLN
50.0000 mg | Freq: Once | INTRAMUSCULAR | Status: AC
  Administered 2023-02-28: 50 mg via INTRAVENOUS
  Filled 2023-02-28: qty 1

## 2023-02-28 MED ORDER — OXYCODONE HCL 5 MG PO TABS
5.0000 mg | ORAL_TABLET | ORAL | Status: DC | PRN
  Administered 2023-03-01 – 2023-03-02 (×2): 5 mg via ORAL
  Filled 2023-02-28 (×2): qty 1

## 2023-02-28 MED ORDER — HYDRALAZINE HCL 20 MG/ML IJ SOLN: 5.0000 mg | INTRAMUSCULAR | Status: DC | PRN

## 2023-02-28 MED ORDER — ALLOPURINOL 100 MG PO TABS
300.0000 mg | ORAL_TABLET | Freq: Two times a day (BID) | ORAL | Status: DC
  Administered 2023-02-28 – 2023-03-01 (×2): 300 mg via ORAL
  Filled 2023-02-28 (×3): qty 3

## 2023-02-28 NOTE — H&P (Signed)
TRH H&P   Patient Demographics:    Adam Reyes, is a 67 y.o. male  MRN: [id number]   DOB - 1955/11/20  Admit Date - 02/28/2023  Outpatient Primary MD for the patient is Anabel Halon, MD  Referring MD/NP/PA: Direct admission from Dr. Ellin Saba   Patient coming from: Infusion center  No chief complaint on file.     HPI:    Adam Reyes  is a 67 y.o. 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 -Patient denies any specific complaints, reports generalized weakness, fatigue, denies any fever, chills, chest pain, has chronic lower extremity edema, Doppler couple months ago, felt secondary to adenopathy, collagen with concern of tumor lysis syndrome, given labs significant for worsening creatinine, elevated phosphorus level, and uric acid, so request has been sent for direct admission, patient he tolerated his chemotherapy infusion today, as well he tolerated Elitek infusion for hyperuricemia, repeat labs are pending.   Review of systems:     A full 10 point Review of Systems was done, except as stated above, all other Review of Systems were negative.   With Past History of the following :    Past Medical History:  Diagnosis Date   Chronic lymphocytic leukemia (CLL), B-cell (HCC)    Small Cell Lymphoma   Coronary artery disease, non-occlusive 02/2017   Cardiac cath in setting of MI: 50 and 55% bifurcation LAD-Diag1   Enlarged lymph node    left neck   Hypertension    STEMI (ST elevation myocardial infarction) (HCC) 03/05/2017   hx/notes  03/05/2017 -likely aborted anterior STEMI with 50% bifurcation LAD-Diag1.  No PCI.  Preserved EF   Syncope due to orthostatic hypotension 04/21/2018      Past Surgical History:  Procedure Laterality Date   BIOPSY  10/10/2020   Procedure: BIOPSY;  Surgeon: Dolores Frame, MD;  [facility]: [facility];  Service: Gastroenterology;;   BIOPSY  11/14/2020   Procedure: BIOPSY;  Surgeon: Dolores Frame, MD;  [facility]: [facility];  Service: Gastroenterology;;   COLONOSCOPY N/A 10/11/2015   Procedure: COLONOSCOPY;  Surgeon: Malissa Hippo, MD;  [facility]: [facility];  Service: Endoscopy;  Laterality: N/A;  10/11/2015   COLONOSCOPY WITH PROPOFOL N/A 10/10/2020   Procedure: COLONOSCOPY WITH PROPOFOL;  Surgeon: Dolores Frame, MD;  Location: [facility];  Service: Gastroenterology;  Laterality: N/A;  AM   ENTEROSCOPY N/A 11/14/2020   Procedure: push enteroscopy;  Surgeon: Dolores Frame, MD;  [facility]: [facility];  Service: Gastroenterology;  Laterality: N/A;   ESOPHAGOGASTRODUODENOSCOPY (EGD) WITH PROPOFOL N/A 10/10/2020   Procedure: ESOPHAGOGASTRODUODENOSCOPY (EGD) WITH PROPOFOL;  Surgeon:  Dolores Frame, MD;  [facility];  Service: Gastroenterology;  Laterality: N/A;   ESOPHAGOGASTRODUODENOSCOPY (EGD) WITH PROPOFOL N/A 11/14/2020   Procedure: ESOPHAGOGASTRODUODENOSCOPY (EGD) WITH PROPOFOL;  Surgeon: Dolores Frame, MD;  [facility];  Service: Gastroenterology;  Laterality: N/A;  12:00   GIVENS CAPSULE STUDY N/A 11/07/2020   Procedure: GIVENS CAPSULE STUDY;  Surgeon: Dolores Frame, MD;  [facility]: [facility];  Service: Gastroenterology;  Laterality: N/A;  7:30 am   Graded Exercise Tolerance Test (GXT/ETT)  05/2017   10.7 METs (9: 25 min.  Reached 103% max predicted heart rate).  No EKG findings to suggest coronary ischemia.  Negative, low risk GXT   HEMOSTASIS CLIP PLACEMENT  11/14/2020   Procedure:  HEMOSTASIS CLIP PLACEMENT;  Surgeon: Dolores Frame, MD;  [facility];  Service: Gastroenterology;;  small bowel nodule   LEFT HEART CATH AND CORONARY ANGIOGRAPHY N/A 03/05/2017   Procedure: LEFT HEART CATH AND CORONARY ANGIOGRAPHY;  Surgeon: Marykay Lex, MD;  Location: [facility];  Service: Cardiovascular: pLAD-Diag1 50-55% (non-flow-limiiting).  EF ~50-55%.  although bifurcation lesion was presumed Culprit - no PCI (not flow limiting). - Med Rx.   MASS BIOPSY Left 06/27/2015   Procedure: OPEN LEFT NECK BIOPSY ;  Surgeon: Newman Pies, MD;  Location: [facility];  Service: ENT;  Laterality: Left;   POLYPECTOMY  10/10/2020   Procedure: POLYPECTOMY;  Surgeon: Dolores Frame, MD;  [facility]: [facility];  Service: Gastroenterology;;   REFRACTIVE SURGERY Bilateral       Social History:     Social History   Tobacco Use   Smoking status: Former    Types: Cigarettes   Smokeless tobacco: Former    Types: Chew, Snuff   Tobacco comments:    03/06/2017 "quit smoking when I was young; quit chew/snuff in ~ 2008"  Substance Use Topics   Alcohol use: Yes    Alcohol/week: 2.0 standard drinks of alcohol    Types: 2 Cans of beer per week    Comment: 2-3 drinks weekly     Family History :     Family History  Problem Relation Age of Onset   Diabetes Mother    Cancer Father    Cancer Brother      Home Medications:   Prior to Admission medications   Medication Sig Start Date End Date Taking? Authorizing Provider  allopurinol (ZYLOPRIM) 300 MG tablet Take 1 tablet (300 mg total) by mouth daily. 02/26/23   Doreatha Massed, MD  amiodarone (PACERONE) 200 MG tablet Take 2 tablets (400 mg total) by mouth 2 (two) times daily for 7 days, THEN 1 tablet (200 mg total) daily. 02/18/23 05/19/23  Almon Hercules, MD  apixaban (ELIQUIS) 5 MG TABS tablet Take 1 tablet (5 mg total) by mouth 2 (two) times daily. 02/18/23   Almon Hercules, MD  atorvastatin  (LIPITOR) 40 MG tablet TAKE ONE (1) TABLET BY MOUTH EVERY DAY 02/14/23   Marykay Lex, MD  cholecalciferol (VITAMIN D3) 25 MCG (1000 UNIT) tablet Take 10,000 Units by mouth daily. 10/09/20   [provider]  ferrous sulfate 324 (65 Fe) MG TBEC Take 324 mg by mouth every other day.    [provider]  oxyCODONE (OXY IR/ROXICODONE) 5 MG immediate release tablet Take 1 tablet (5 mg total) by mouth every 4 (four) hours as needed for severe pain. 02/25/23   Doreatha Massed, MD  prochlorperazine (COMPAZINE) 10 MG tablet  Take 1 tablet (10 mg total) by mouth every 6 (six) hours as needed for nausea or vomiting. 02/27/23   Doreatha Massed, MD  senna-docusate (SENOKOT-S) 8.6-50 MG tablet Take 1 tablet by mouth 2 (two) times daily between meals as needed for mild constipation. 02/18/23   Almon Hercules, MD  traMADol (ULTRAM) 50 MG tablet Take 1 tablet (50 mg total) by mouth every 6 (six) hours as needed. 02/10/23   Doreatha Massed, MD     Allergies:     Allergies  Allergen Reactions   Gazyva [Obinutuzumab] Other (See Comments)    Rigors      Physical Exam:   Vitals  Blood pressure 123/65, pulse 78, temperature 98.1 F (36.7 C), temperature source Oral, resp. rate 20, SpO2 94%.   1. General Developed male, lying in bed in NAD,    2. Normal affect and insight, Not Suicidal or Homicidal, Awake Alert, Oriented X 3.  3. No F.N deficits, ALL C.Nerves Intact, Strength 5/5 all 4 extremities, Sensation intact all 4 extremities, Plantars down going.  4. Ears and Eyes appear Normal, Conjunctivae clear, PERRLA. Moist Oral Mucosa.  5. Supple Neck, No JVD, No cervical lymphadenopathy appriciated, No Carotid Bruits.  6. Symmetrical Chest wall movement, Good air movement bilaterally, CTAB.  7. RRR, No Gallops, Rubs or Murmurs, No Parasternal Heave.+3 edema  8. Positive Bowel Sounds, Abdomen Soft, No tenderness, No organomegaly appriciated,No rebound -guarding or  rigidity.  9.  No Cyanosis, Normal Skin Turgor, No Skin Rash or Bruise.  10. Good muscle tone,  joints appear normal , no effusions, Normal ROM.     CBC Recent Labs  Lab 02/25/23 1455  WBC 20.0*  HGB 13.7  HCT 41.3  PLT 101*  MCV 100.5*  MCH 33.3  MCHC 33.2  RDW 17.9*  LYMPHSABS 7.0*  MONOABS 3.0*  EOSABS 0.8*  BASOSABS 0.4*   ------------------------------------------------------------------------------------------------------------------  Chemistries  Recent Labs  Lab 02/25/23 1455 02/27/23 0845 02/28/23 0832  NA 130* 129* 128*  K 5.1 4.7 4.7  CL 97* 97* 99  CO2 21* 22 18*  GLUCOSE 149* 113* 173*  BUN 34* 43* 57*  CREATININE 1.49* 1.45* 1.48*  CALCIUM 8.9 8.6* 8.1*  MG 2.9* 2.8* 2.9*  AST 53* 48* 67*  ALT 45* 45* 46*  ALKPHOS 162* 157* 145*  BILITOT 2.7* 2.4* 2.3*   ------------------------------------------------------------------------------------------------------------------ estimated creatinine clearance is 72.1 mL/min (A) (by C-G formula based on SCr of 1.48 mg/dL (H)). ------------------------------------------------------------------------------------------------------------------ No results for input(s): "TSH", "T4TOTAL", "T3FREE", "THYROIDAB" in the last 72 hours.  Invalid input(s): "FREET3"  Coagulation profile No results for input(s): "INR", "PROTIME" in the last 168 hours. ------------------------------------------------------------------------------------------------------------------- No results for input(s): "DDIMER" in the last 72 hours. -------------------------------------------------------------------------------------------------------------------  Cardiac Enzymes No results for input(s): "CKMB", "TROPONINI", "MYOGLOBIN" in the last 168 hours.  Invalid input(s): "CK" ------------------------------------------------------------------------------------------------------------------ No results found for:  "BNP"   ---------------------------------------------------------------------------------------------------------------  Urinalysis    Component Value Date/Time   COLORURINE AMBER (A) 02/12/2023 2023   APPEARANCEUR HAZY (A) 02/12/2023 2023   LABSPEC 1.044 (H) 02/12/2023 2023   PHURINE 6.0 02/12/2023 2023   GLUCOSEU NEGATIVE 02/12/2023 2023   HGBUR MODERATE (A) 02/12/2023 2023   BILIRUBINUR SMALL (A) 02/12/2023 2023   KETONESUR NEGATIVE 02/12/2023 2023   PROTEINUR 100 (A) 02/12/2023 2023   NITRITE POSITIVE (A) 02/12/2023 2023   LEUKOCYTESUR NEGATIVE 02/12/2023 2023    ----------------------------------------------------------------------------------------------------------------   Imaging Results:    No results found.   [id number]   Assessment & Plan:  Principal Problem:   Tumor lysis syndrome Active Problems:   Question of Richter's transformation   AKI (acute kidney injury) (HCC)   Chronic lymphocytic leukemia (CLL), B-cell (HCC)   Dyslipidemia, goal LDL below 70   Essential hypertension   Aortic atherosclerosis (HCC)   Elevated liver enzymes   Atrial fibrillation with RVR (HCC)   AKI Elevated uric acid Hyperphosphatemia Concern for lysis syndrome CLL concern for Richter's transformation, SLL -Patient with known history of CLL concern for Richter's transformation, SLL, he is on Gazyva , findings concerning for tumor lysis syndrome, history of CLL, undergoing active chemotherapy, over last couple days concerning for tumor lysis syndrome, given creatinine of 1.48, with baseline around 1 on 8/5, elevated uric acid yesterday at 10.3, calcium low at 8.1, phosphorus elevated at 5.4. - patient received ELITEK in infusion center. -Given aggressive IV hydration, will keep on NS 200 cc/h will monitor uric acid, phosphorus level, BMP every 8 hours.  Per oncology recommendation. -Will start on allopurinol milligram p.o. twice daily per oncology recommendation. -Avoid  nephrotoxic medications  Hyponatremia -Continue with IV NS  Transaminitis -Appears to be stable from previous admissions, avoid hepatotoxic medication, will hold statin for now it was felt secondary to ibrutinib which she was taking until recently, no significant hepatobiliary finding on CT during recent admission, negative hepatitis panel as well during recent admission   A-fib with RVR -Was diagnosed earlier in the month during recent hospital stay, status post DCCV, continue with amiodarone and Eliquis  Hyperdynamic LV function with LVOT obstruction - Avoid hypotension.    Hypokalemia: Resolved.   CAD/dyslipidemia: -Aspirin has been continued as he is on Eliquis, -On hold due to transaminitis   Elevated liver Enzymes/mild hyperbilirubinemia: Mild.  Possibly from ibrutinib.  No significant hepatobiliary finding on CT. HIV and acute hepatitis panel negative. -Monitor.  Bilateral lower extremity edema -this is most likely in the setting of lymphadenopathy Doppler negative last month for DVT  Chronic diastolic CHF -recent echo with EF 70 to 75%, with grade 2 diastolic dysfunction, monitor closely as on IV fluids     DVT Prophylaxis On Eliquis AM Labs Ordered, also please review Full Orders  Family Communication: Admission, patients condition and plan of care including tests being ordered have been discussed with the patient  and wife at bedsidewho indicate understanding and agree with the plan and Code Status.  Code Status full  Likely DC to  home  Condition GUARDED    Consults called: D/W Dr Kirtland Bouchard   Admission status: inpatient    Time spent in minutes : 70 minutes   Huey Bienenstock M.D on 02/28/2023 at 5:02 PM   [facility] - Office  [phone]

## 2023-02-28 NOTE — Patient Instructions (Signed)
[facility] [place]  Discharge Instructions: Thank you for choosing [facility] to provide your oncology and hematology care.  If you have a lab appointment with the Cancer Center - please note that after April 8th, 2024, all labs will be drawn in the cancer center.  You do not have to check in or register with the main entrance as you have in the past but will complete your check-in in the cancer center.  Wear comfortable clothing and clothing appropriate for easy access to any Portacath or PICC line.   We strive to give you quality time with your provider. You may need to reschedule your appointment if you arrive late (15 or more minutes).  Arriving late affects you and other patients whose appointments are after yours.  Also, if you miss three or more appointments without notifying the office, you may be dismissed from the clinic at the provider's discretion.      For prescription refill requests, have your pharmacy contact our office and allow 72 hours for refills to be completed.    Today you received the following chemotherapy and/or immunotherapy agents Gazyva.  Obinutuzumab Injection What is this medication? OBINUTUZUMAB (OH bi nue TOOZ ue mab) treats leukemia and lymphoma. It works by blocking a protein that causes cancer cells to grow and multiply. This helps to slow or stop the spread of cancer cells. It is a monoclonal antibody. This medicine may be used for other purposes; ask your health care provider or pharmacist if you have questions. COMMON BRAND NAME(S): GAZYVA What should I tell my care team before I take this medication? They need to know if you have any of these conditions: Heart disease Infection, especially a viral infection, such as hepatitis B Lung or breathing disease Take medications that treat or prevent blood clots An unusual or allergic reaction to obinutuzumab, other medications, foods, dyes, or preservatives Pregnant or trying to  get pregnant Breastfeeding How should I use this medication? This medication is for infusion into a vein. It is given by a care team in a hospital or clinic setting. Talk to your care team about the use of this medication in children. Special care may be needed. Overdosage: If you think you have taken too much of this medicine contact a poison control center or emergency room at once. NOTE: This medicine is only for you. Do not share this medicine with others. What if I miss a dose? Keep appointments for follow-up doses as directed. It is important not to miss your dose. Call your care team if you are unable to keep an appointment. What may interact with this medication? Live virus vaccines This list may not describe all possible interactions. Give your health care provider a list of all the medicines, herbs, non-prescription drugs, or dietary supplements you use. Also tell them if you smoke, drink alcohol, or use illegal drugs. Some items may interact with your medicine. What should I watch for while using this medication? Report any side effects that you notice during your treatment right away, such as changes in your breathing, fever, chills, dizziness or lightheadedness. These effects are more common with the first dose. Visit your care team for checks on your progress. You will need to have regular blood work. Report any other side effects. The side effects of this medication can continue after you finish your treatment. Continue your course of treatment even though you feel ill unless your care team tells you to stop. Call your   care team for advice if you get a fever, chills or sore throat, or other symptoms of a cold or flu. Do not treat yourself. This medication decreases your body's ability to fight infections. Try to avoid being around people who are sick. This medication may increase your risk to bruise or bleed. Call your care team if you notice any unusual bleeding. Do not become  pregnant while taking this medication or for 6 months after stopping it. Inform your care team if you wish to become pregnant or think you might be pregnant. There is a potential for serious side effects to an unborn child. Talk to your care team or pharmacist for more information. Do not breast-feed an infant while taking this medication or for 6 months after stopping it. What side effects may I notice from receiving this medication? Side effects that you should report to your care team as soon as possible: Allergic reactions--skin rash, itching, hives, swelling of the face, lips, tongue, or throat Bleeding--bloody or black, tar-like stools, vomiting blood or brown material that looks like coffee grounds, red or dark brown urine, small red or purple spots on skin, unusual bruising or bleeding Blood clot--pain, swelling, or warmth in the leg, shortness of breath, chest pain Dizziness, loss of balance or coordination, confusion or trouble speaking Infection--fever, chills, cough, sore throat, wounds that don't heal, pain or trouble when passing urine, general feeling of discomfort or being unwell Infusion reactions--chest pain, shortness of breath or trouble breathing, feeling faint or lightheaded Liver injury--right upper belly pain, loss of appetite, nausea, light-colored stool, dark yellow or brown urine, yellowing skin or eyes, unusual weakness or fatigue Tumor lysis syndrome (TLS)--nausea, vomiting, diarrhea, decrease in the amount of urine, dark urine, unusual weakness or fatigue, confusion, muscle pain or cramps, fast or irregular heartbeat, joint pain Side effects that usually do not require medical attention (report to your care team if they continue or are bothersome): Bone, joint, or muscle pain Constipation Diarrhea Fatigue Runny or stuffy nose Sore throat This list may not describe all possible side effects. Call your doctor for medical advice about side effects. You may report side  effects to FDA at 1-800-FDA-1088. Where should I keep my medication? This medication is only given in a hospital or clinic and will not be stored at home. NOTE: This sheet is a summary. It may not cover all possible information. If you have questions about this medicine, talk to your doctor, pharmacist, or health care provider.  2024 Elsevier/Gold Standard (2021-11-21 00:00:00)        To help prevent nausea and vomiting after your treatment, we encourage you to take your nausea medication as directed.  BELOW ARE SYMPTOMS THAT SHOULD BE REPORTED IMMEDIATELY: *FEVER GREATER THAN 100.4 F (38 C) OR HIGHER *CHILLS OR SWEATING *NAUSEA AND VOMITING THAT IS NOT CONTROLLED WITH YOUR NAUSEA MEDICATION *UNUSUAL SHORTNESS OF BREATH *UNUSUAL BRUISING OR BLEEDING *URINARY PROBLEMS (pain or burning when urinating, or frequent urination) *BOWEL PROBLEMS (unusual diarrhea, constipation, pain near the anus) TENDERNESS IN MOUTH AND THROAT WITH OR WITHOUT PRESENCE OF ULCERS (sore throat, sores in mouth, or a toothache) UNUSUAL RASH, SWELLING OR PAIN  UNUSUAL VAGINAL DISCHARGE OR ITCHING   Items with * indicate a potential emergency and should be followed up as soon as possible or go to the Emergency Department if any problems should occur.  Please show the CHEMOTHERAPY ALERT CARD or IMMUNOTHERAPY ALERT CARD at check-in to the Emergency Department and triage nurse.  Should you have   questions after your visit or need to cancel or reschedule your appointment, please contact [facility] AT  [phone]  and follow the prompts.  Office hours are 8:00 a.m. to 4:30 p.m. Monday - Friday. Please note that voicemails left after 4:00 p.m. may not be returned until the following business day.  We are closed weekends and major holidays. You have access to a nurse at all times for urgent questions. Please call the main number to the clinic [phone] and follow the prompts.  For any non-urgent  questions, you may also contact your provider using MyChart. We now offer e-Visits for anyone 18 and older to request care online for non-urgent symptoms. For details visit [email or web address].   Also download the MyChart app! Go to the app store, search "MyChart", open the app, select [facility], and log in with your MyChart username and password.   

## 2023-02-28 NOTE — Plan of Care (Signed)
  Problem: Education: Goal: Knowledge of General Education information will improve Description: Including pain rating scale, medication(s)/side effects and non-pharmacologic comfort measures Outcome: Progressing   Problem: Health Behavior/Discharge Planning: Goal: Ability to manage health-related needs will improve Outcome: Progressing   Problem: Clinical Measurements: Goal: Ability to maintain clinical measurements within normal limits will improve Outcome: Progressing Goal: Diagnostic test results will improve Outcome: Progressing Goal: Respiratory complications will improve Outcome: Progressing Goal: Cardiovascular complication will be avoided Outcome: Progressing   Problem: Elimination: Goal: Will not experience complications related to bowel motility Outcome: Progressing Goal: Will not experience complications related to urinary retention Outcome: Progressing

## 2023-02-28 NOTE — Progress Notes (Signed)
Repeat labs this evening showing bicarb level down to 15, with anion gap of 17, I will start on bicarb drip at 75 cc/h, and will decrease his IV NS to 125 cc/h, which should be total of 200 cc of IV fluid per hour. Huey Bienenstock MD

## 2023-02-28 NOTE — Progress Notes (Signed)
Patient presents today for C1D2 Gazyva infusion.  Patient is in satisfactory condition with no new complaints voiced.  Vital signs are stable.  Labs drawn today for possible IVF.  IV placed in right AC.  IV flushed well with good blood return noted.    Labs resulted and MD made aware.  We will give Elitek 3 mg today per Dr. Ellin Saba.  We will also give 1 L of NS over 2 hours per Dr. Ellin Saba.  We will proceed with treatment per MD orders.    Patient evaluated in office by Durenda Hurt, NP.  Patient will be admitted inpatient for observation per NP and Dr. Ellin Saba.  Patient will be transported to room 301 at completion of treatment.   Patient tolerated treatment well with no complaints voiced.  IV remains intact per request of charge RN on Dept 300.  Patient transported to Dept 300 via wheelchair by RN with wife in stable condition.  Vital signs stable at discharge.  Follow up as scheduled.

## 2023-03-01 ENCOUNTER — Inpatient Hospital Stay (HOSPITAL_COMMUNITY): Payer: PPO

## 2023-03-01 DIAGNOSIS — E785 Hyperlipidemia, unspecified: Secondary | ICD-10-CM

## 2023-03-01 DIAGNOSIS — R748 Abnormal levels of other serum enzymes: Secondary | ICD-10-CM | POA: Diagnosis not present

## 2023-03-01 DIAGNOSIS — I10 Essential (primary) hypertension: Secondary | ICD-10-CM

## 2023-03-01 DIAGNOSIS — E883 Tumor lysis syndrome: Secondary | ICD-10-CM | POA: Diagnosis not present

## 2023-03-01 LAB — BASIC METABOLIC PANEL
Anion gap: 9 (ref 5–15)
Anion gap: 11 (ref 5–15)
Anion gap: 11 (ref 5–15)
BUN: 55 mg/dL — ABNORMAL HIGH (ref 8–23)
BUN: 60 mg/dL — ABNORMAL HIGH (ref 8–23)
BUN: 58 mg/dL — ABNORMAL HIGH (ref 8–23)
CO2: 19 mmol/L — ABNORMAL LOW (ref 22–32)
CO2: 19 mmol/L — ABNORMAL LOW (ref 22–32)
CO2: 19 mmol/L — ABNORMAL LOW (ref 22–32)
Calcium: 7.9 mg/dL — ABNORMAL LOW (ref 8.9–10.3)
Calcium: 7.9 mg/dL — ABNORMAL LOW (ref 8.9–10.3)
Calcium: 7.4 mg/dL — ABNORMAL LOW (ref 8.9–10.3)
Chloride: 103 mmol/L (ref 98–111)
Chloride: 101 mmol/L (ref 98–111)
Chloride: 100 mmol/L (ref 98–111)
Creatinine, Ser: 1.46 mg/dL — ABNORMAL HIGH (ref 0.61–1.24)
Creatinine, Ser: 1.6 mg/dL — ABNORMAL HIGH (ref 0.61–1.24)
Creatinine, Ser: 1.47 mg/dL — ABNORMAL HIGH (ref 0.61–1.24)
GFR, Estimated: 53 mL/min — ABNORMAL LOW (ref >60)
GFR, Estimated: 48 mL/min — ABNORMAL LOW (ref >60)
GFR, Estimated: 53 mL/min — ABNORMAL LOW (ref >60)
Glucose, Bld: 119 mg/dL — ABNORMAL HIGH (ref 70–99)
Glucose, Bld: 89 mg/dL (ref 70–99)
Glucose, Bld: 110 mg/dL — ABNORMAL HIGH (ref 70–99)
Potassium: 4.9 mmol/L (ref 3.5–5.1)
Potassium: 4.9 mmol/L (ref 3.5–5.1)
Potassium: 4.6 mmol/L (ref 3.5–5.1)
Sodium: 131 mmol/L — ABNORMAL LOW (ref 135–145)
Sodium: 131 mmol/L — ABNORMAL LOW (ref 135–145)
Sodium: 130 mmol/L — ABNORMAL LOW (ref 135–145)

## 2023-03-01 LAB — CBC
HCT: 33.3 % — ABNORMAL LOW (ref 39.0–52.0)
Hemoglobin: 11.1 g/dL — ABNORMAL LOW (ref 13.0–17.0)
MCH: 33.4 pg (ref 26.0–34.0)
MCHC: 33.3 g/dL (ref 30.0–36.0)
MCV: 100.3 fL — ABNORMAL HIGH (ref 80.0–100.0)
Platelets: 41 10*3/uL — ABNORMAL LOW (ref 150–400)
RBC: 3.32 MIL/uL — ABNORMAL LOW (ref 4.22–5.81)
RDW: 18.6 % — ABNORMAL HIGH (ref 11.5–15.5)
WBC: 2.8 10*3/uL — ABNORMAL LOW (ref 4.0–10.5)
nRBC: 4.3 % — ABNORMAL HIGH (ref 0.0–0.2)

## 2023-03-01 LAB — PHOSPHORUS
Phosphorus: 5.4 mg/dL — ABNORMAL HIGH (ref 2.5–4.6)
Phosphorus: 5.3 mg/dL — ABNORMAL HIGH (ref 2.5–4.6)
Phosphorus: 4.9 mg/dL — ABNORMAL HIGH (ref 2.5–4.6)

## 2023-03-01 LAB — URINALYSIS, ROUTINE W REFLEX MICROSCOPIC
Bilirubin Urine: NEGATIVE
Glucose, UA: NEGATIVE mg/dL
Hgb urine dipstick: NEGATIVE
Ketones, ur: NEGATIVE mg/dL
Leukocytes,Ua: NEGATIVE
Nitrite: NEGATIVE
Protein, ur: NEGATIVE mg/dL
Specific Gravity, Urine: 1.018 (ref 1.005–1.030)
pH: 5 (ref 5.0–8.0)

## 2023-03-01 LAB — URIC ACID
Uric Acid, Serum: 5.1 mg/dL (ref 3.7–8.6)
Uric Acid, Serum: 5.3 mg/dL (ref 3.7–8.6)
Uric Acid, Serum: 6 mg/dL (ref 3.7–8.6)

## 2023-03-01 NOTE — Progress Notes (Addendum)
   03/01/23 1527  TOC Brief Assessment  Insurance and Status Reviewed  Patient has primary care physician Yes  Home environment has been reviewed Home with spouse  Prior level of function: Independent  Prior/Current Home Services No current home services  Social Determinants of Health Reivew SDOH reviewed no interventions necessary  Readmission risk has been reviewed Yes ([place])   Direct admit from infusion center. 30 day readmin risk complete.  If TOC needs arise, will respond at that time. CSW contacted spouse to assess needs at home/further left message.

## 2023-03-01 NOTE — Progress Notes (Signed)
Progress Note   Patient: Adam Reyes UJW:[id number] DOB: [birth year] DOA: 02/28/2023     1 DOS: the patient was seen and examined on 03/01/2023   Brief hospital admission narrative: As per H&P written by Dr. Randol Kern on 02/28/2023  Adam Reyes  is a 67 y.o. 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 -Patient denies any specific complaints, reports generalized weakness, fatigue, denies any fever, chills, chest pain, has chronic lower extremity edema, Doppler couple months ago, felt secondary to adenopathy, collagen with concern of tumor lysis syndrome, given labs significant for worsening creatinine, elevated phosphorus level, and uric acid, so request has been sent for direct admission, patient he tolerated his chemotherapy infusion today, as well he tolerated Elitek infusion for hyperuricemia, repeat labs are pending.  Assessment and Plan:  1-acute kidney injury -With concern for tumor lysis syndrome -Elevated uric acid appreciated -Creatinine in the 1.6 range -Continue fluid resuscitation -Checking urinalysis -Patient advised to maintain adequate oral hydration -Continue the use of Zyloprim -Minimize the use of contrast and nephrotoxic agents -Avoid hypotension and follow clinical response.  2-tumor lysis syndrome/CLL concern for Richter's transformation, SLL -Case discussed with oncology service (Dr. Ellin Saba) -Continue to follow recommendations for acute treatment with aggressive hydration and allopurinol -Elitek provided at time of admission at the infusion center -Continue close monitoring of electrolytes trend.  3-history of atrial  fibrillation -Continue amiodarone and Eliquis -Follow-up potassium and magnesium with goal above 4 and 2 respectively as much as possible. -Patient is status post cardioversion earlier this month.  4-hypokalemia -Stable/resolved -Continue to follow electrolytes.  5-dyslipidemia -Currently holding cholesterol medications in the setting of transaminitis -Heart healthy diet discussed with patient.  6-transaminitis -Overall stable -Continue minimizing hepatotoxic medications including a statin -Maintain adequate hydration and follow LFTs -Recent admission with negative hepatitis panel and HIV.  7-fever -Holding on antibiotics without acute source -Checking chest x-ray and urinalysis -As needed antipyretics will be provided  8-chronic diastolic heart failure -Recent echo with ejection fraction 70 to 75% and grade 2 diastolic dysfunction -Continue judicious fluid resuscitation -Daily weights and strict I's and O's -Chest x-ray has been ordered for fever; will help to evaluate patient's pulmonary vascular status.  9-class II obesity -Body mass index is 35.29 kg/m. -Low-calorie diet and portion control discussed with patient.  10-metabolic acidosis -In the setting of dehydration and renal failure -Continue treatment with bicarbonate infusion and fluid resuscitation -Follow electrolytes trend and clinical response. -Bicarb 19.  Subjective:  Patient has spiked fever 102.2; no chest pain, no nausea, no vomiting.  Reports decreased appetite.  Physical Exam: Vitals:   03/01/23 1300 03/01/23 1649 03/01/23 1716 03/01/23 1800  BP: (!) 123/56 123/68    Pulse: 96 99    Resp: (!) 24 (!) 26    Temp: 98.5 F (36.9 C) (!) 102.2 F (39 C) 100.2 F (37.9 C) 98 F (36.7 C)  TempSrc: Oral Rectal Oral Oral  SpO2: 93% 93%    Weight:       General exam: Alert, awake, oriented x 3; no chest pain, reporting anorexia. Respiratory system: Positive scattered rhonchi; no using accessory  muscle.  No frank crackles. Cardiovascular system:RRR. No rubs or gallops. Gastrointestinal system: Abdomen is obese, nondistended, soft and nontender. No organomegaly  or masses felt. Normal bowel sounds heard. Central nervous system: Alert and oriented. No focal neurological deficits. Extremities: No cyanosis or clubbing; trace edema appreciated bilaterally. Skin: No petechiae. Psychiatry: Judgement and insight appear normal. Mood & affect appropriate.   Data Reviewed: Uric acid: 6.0 Phosphorus: 5.3 Basic metabolic panel: Sodium 131, potassium 4.9, chloride 101, bicarb 19, BUN 60, creatinine 1.60, GFR 48 and calcium 7.9.   Family Communication: Wife at bedside.  Disposition: Status is: Inpatient Remains inpatient appropriate because: Continue fluid resuscitation and workup for fever.   Planned Discharge Destination: Home  Time spent: 50 minutes  Author: Vassie Loll, MD 03/01/2023 6:17 PM  For on call review [email or web address].

## 2023-03-01 NOTE — Progress Notes (Signed)
Patient has rested well this am.  Earlier at beginning of shift, patient did c/o sob.  Staff was not aware of his episode of sob in real time.  Notified Md of SOB and received order for a chest x-ray. Chest x-ray completed. Lasix given per MD order.  Results were reviewed by MD and change in fluid order received.  Patient has not voiced any more c/o sob, oxygen saturations have been in the 90s on room air.  Patient at times does appear sob and tachypneic while lying in bed.  Wife has been at bedside entire shift.  Patient has voiced no other complaints.

## 2023-03-01 NOTE — Progress Notes (Signed)
Pt spiked a fever of 102.2 rectally around 1649. PO tylenol was given. Temp of 98.0 orally around 1716. MD made aware. New orders given.

## 2023-03-01 NOTE — Plan of Care (Signed)

## 2023-03-01 NOTE — Progress Notes (Signed)
   03/01/23 1649  Vitals  Temp (!) 102.2 F (39 C)  Temp Source Rectal  BP 123/68  MAP (mmHg) 84  BP Location Left Arm  BP Method Automatic  Patient Position (if appropriate) Sitting  Pulse Rate 99  Pulse Rate Source Dinamap  Resp (!) 26  Level of Consciousness  Level of Consciousness Alert  MEWS COLOR  MEWS Score Color Red  Oxygen Therapy  SpO2 93 %  O2 Device Room Air  Pain Assessment  Pain Scale 0-10  Pain Score 0  MEWS Score  MEWS Temp 2  MEWS Systolic 0  MEWS Pulse 0  MEWS RR 2  MEWS LOC 0  MEWS Score 4   In to check pt's IV pump which was beeping and noted that pt was tachypneic and face was flushed. Pt with fan blowing on his face and cold wash cloth on his forehead. Pt denied SOB but wife stated that he did seem to be breathing much harder than earlier and was c/o being "hot".  Vital signs obtained as listed above. Pt treated with oral Tylenol and advised that temp will be rechecked within the hour. Advised pt to call if any needs. Pt and wife state understanding.

## 2023-03-02 DIAGNOSIS — R748 Abnormal levels of other serum enzymes: Secondary | ICD-10-CM | POA: Diagnosis not present

## 2023-03-02 DIAGNOSIS — I700 Atherosclerosis of aorta: Secondary | ICD-10-CM | POA: Diagnosis not present

## 2023-03-02 DIAGNOSIS — C9110 Chronic lymphocytic leukemia of B-cell type not having achieved remission: Secondary | ICD-10-CM | POA: Diagnosis not present

## 2023-03-02 DIAGNOSIS — E883 Tumor lysis syndrome: Secondary | ICD-10-CM | POA: Diagnosis not present

## 2023-03-02 LAB — BASIC METABOLIC PANEL
Anion gap: 9 (ref 5–15)
BUN: 57 mg/dL — ABNORMAL HIGH (ref 8–23)
CO2: 21 mmol/L — ABNORMAL LOW (ref 22–32)
Calcium: 7.5 mg/dL — ABNORMAL LOW (ref 8.9–10.3)
Chloride: 100 mmol/L (ref 98–111)
Creatinine, Ser: 1.38 mg/dL — ABNORMAL HIGH (ref 0.61–1.24)
GFR, Estimated: 57 mL/min — ABNORMAL LOW (ref >60)
Glucose, Bld: 85 mg/dL (ref 70–99)
Potassium: 4.3 mmol/L (ref 3.5–5.1)
Sodium: 130 mmol/L — ABNORMAL LOW (ref 135–145)

## 2023-03-02 LAB — PHOSPHORUS
Phosphorus: 5.1 mg/dL — ABNORMAL HIGH (ref 2.5–4.6)
Phosphorus: 4.9 mg/dL — ABNORMAL HIGH (ref 2.5–4.6)

## 2023-03-02 LAB — URIC ACID
Uric Acid, Serum: 5.8 mg/dL (ref 3.7–8.6)
Uric Acid, Serum: 6.1 mg/dL (ref 3.7–8.6)

## 2023-03-02 MED ORDER — ALLOPURINOL 300 MG PO TABS: 300.0000 mg | ORAL_TABLET | Freq: Two times a day (BID) | ORAL | 2 refills | Status: DC

## 2023-03-02 NOTE — Progress Notes (Signed)
Nsg Discharge Note  Admit Date:  02/28/2023 Discharge date: 03/02/2023   MARQUI DEHAVEN to be D/C'd Home per MD order.  AVS completed.   Patient/caregiver able to verbalize understanding.  Discharge Medication: Allergies as of 03/02/2023       Reactions   Gazyva [obinutuzumab] Other (See Comments)   Rigors         Medication List     TAKE these medications    allopurinol 300 MG tablet Commonly known as: ZYLOPRIM Take 1 tablet (300 mg total) by mouth 2 (two) times daily. What changed: when to take this   amiodarone 200 MG tablet Commonly known as: PACERONE Take 2 tablets (400 mg total) by mouth 2 (two) times daily for 7 days, THEN 1 tablet (200 mg total) daily. Start taking on: February 18, 2023   atorvastatin 40 MG tablet Commonly known as: LIPITOR TAKE ONE (1) TABLET BY MOUTH EVERY DAY   cholecalciferol 25 MCG (1000 UNIT) tablet Commonly known as: VITAMIN D3 Take 5,000 Units by mouth every other day.   Eliquis 5 MG Tabs tablet Generic drug: apixaban Take 1 tablet (5 mg total) by mouth 2 (two) times daily.   ferrous sulfate 324 (65 Fe) MG Tbec Take 324 mg by mouth every other day.   oxyCODONE 5 MG immediate release tablet Commonly known as: Oxy IR/ROXICODONE Take 1 tablet (5 mg total) by mouth every 4 (four) hours as needed for severe pain.   senna-docusate 8.6-50 MG tablet Commonly known as: Senokot-S Take 1 tablet by mouth 2 (two) times daily between meals as needed for mild constipation.        Discharge Assessment: Vitals:   03/02/23 1139 03/02/23 1320  BP:  (!) 115/57  Pulse:  90  Resp:  20  Temp: 99.1 F (37.3 C) 98.1 F (36.7 C)  SpO2:  93%   Skin clean, dry and intact without evidence of skin break down, no evidence of skin tears noted. IV catheter discontinued intact. Site without signs and symptoms of complications - no redness or edema noted at insertion site, patient denies c/o pain - only slight tenderness at site.  Dressing with slight  pressure applied.  D/c Instructions-Education: Discharge instructions given to patient/family with verbalized understanding. D/c education completed with patient/family including follow up instructions, medication list, d/c activities limitations if indicated, with other d/c instructions as indicated by MD - patient able to verbalize understanding, all questions fully answered. Patient instructed to return to ED, call 911, or call MD for any changes in condition.  Patient escorted via WC, and D/C home via private auto.  Laurena Spies, RN 03/02/2023 2:37 PM

## 2023-03-02 NOTE — Discharge Summary (Signed)
Physician Discharge Summary   Patient: Adam Reyes MRN: [id number] DOB: [birth year]  Admit date:     02/28/2023  Discharge date: 03/02/23  Discharge Physician: Vassie Loll   PCP: Anabel Halon, MD   Recommendations at discharge:  Repeat CBC to follow hemoglobin/WBCs trend and stability Repeat complete basic metabolic panel to follow electrolytes, LFTs and renal function trend/stability. Make sure patient follow-up with oncology service as instructed Reassess blood pressure and adjust antihypertensive regimen as needed.   Discharge Diagnoses: Principal Problem:   Tumor lysis syndrome Active Problems:   Question of Richter's transformation   AKI (acute kidney injury) (HCC)   Chronic lymphocytic leukemia (CLL), B-cell (HCC)   Dyslipidemia, goal LDL below 70   Essential hypertension   Aortic atherosclerosis (HCC)   Elevated liver enzymes   Atrial fibrillation with RVR [facility])  Hospital Course: Brief hospital admission narrative: As per H&P written by Dr. Randol Kern on 02/28/2023  Adam Reyes  is a 67 y.o. 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 -Patient denies any specific complaints, reports generalized weakness, fatigue, denies any fever, chills, chest pain, has chronic lower extremity edema, Doppler couple months ago, felt secondary to adenopathy, collagen with concern of tumor lysis syndrome, given labs significant for worsening creatinine, elevated phosphorus level, and uric acid, so request has been sent for direct admission, patient he tolerated his chemotherapy infusion today, as well he tolerated Elitek infusion for hyperuricemia,  repeat labs are pending.  Assessment and Plan:  1-acute kidney injury -With concern for tumor lysis syndrome -Elevated uric acid appreciated at time of admission; corrected and less than 6 at discharge. -Creatinine in the 1.3 range at time of discharge; normal GFR appreciated. -Continue to maintain adequate hydration. -Patient advised to maintain adequate oral hydration -Continue the use of Zyloprim -Continue minimizing nephrotoxic agents as much as possible. -Continue to avoid hypotension and the use of contrast   2-tumor lysis syndrome/CLL concern for Richter's transformation, SLL -Case discussed with oncology service (Dr. Ellin Saba) -Continue to follow recommendations for acute treatment with aggressive hydration and allopurinol -Elitek was provided at time of admission at the infusion center -Continue close monitoring of electrolytes trend.   3-history of atrial fibrillation -Continue amiodarone and Eliquis -Follow-up potassium and magnesium with goal above 4 and 2 respectively as much as possible. -Patient is status post cardioversion earlier this month. -Continue outpatient follow-up with cardiology service.   4-hypokalemia -Stable/resolved -Continue to follow electrolytes. -Repeat complete metabolic panel to follow electrolytes at follow-up visit.   5-dyslipidemia -Safe to resume home statins at discharge. -Heart healthy diet discussed with patient.   6-transaminitis -Overall stable/improved at time of discharge; safe to resume home use of a statin. -Continue minimizing hepatotoxic medications. -Maintain adequate hydration and follow LFTs -Recent admission with negative hepatitis panel and HIV.   7-fever -No antibiotics required -Patient underlying condition most likely responsible for isolated temperature; continue as needed antipyretics -Urinalysis and chest x-ray not demonstrating acute source of infection.   8-chronic diastolic heart failure -Recent echo with  ejection fraction 70 to 75% and grade 2 diastolic dysfunction -Continue outpatient follow-up with cardiology service. -Continue to follow Daily weights, low-sodium diet and adequate hydration. -Chest x-ray demonstrating just atelectasis; no vascular congestion or acute infiltrates.  9-class II obesity -Body mass index is 35.29 kg/m. -Low-calorie diet and portion control discussed with patient.   10-metabolic acidosis -In the setting of dehydration and renal failure -Continue to maintain adequate nutrition and hydration. -Follow electrolytes trend/stability with repeat basic metabolic panel at follow-up visit. -Bicarb above 20 at discharge.         Consultants: Oncology service Procedures performed: See below for x-ray reports Disposition: Home Diet recommendation: Heart healthy diet  DISCHARGE MEDICATION: Allergies as of 03/02/2023       Reactions   Gazyva [obinutuzumab] Other (See Comments)   Rigors         Medication List     TAKE these medications    allopurinol 300 MG tablet Commonly known as: ZYLOPRIM Take 1 tablet (300 mg total) by mouth 2 (two) times daily. What changed: when to take this   amiodarone 200 MG tablet Commonly known as: PACERONE Take 2 tablets (400 mg total) by mouth 2 (two) times daily for 7 days, THEN 1 tablet (200 mg total) daily. Start taking on: February 18, 2023   atorvastatin 40 MG tablet Commonly known as: LIPITOR TAKE ONE (1) TABLET BY MOUTH EVERY DAY   cholecalciferol 25 MCG (1000 UNIT) tablet Commonly known as: VITAMIN D3 Take 5,000 Units by mouth every other day.   Eliquis 5 MG Tabs tablet Generic drug: apixaban Take 1 tablet (5 mg total) by mouth 2 (two) times daily.   ferrous sulfate 324 (65 Fe) MG Tbec Take 324 mg by mouth every other day.   oxyCODONE 5 MG immediate release tablet Commonly known as: Oxy IR/ROXICODONE Take 1 tablet (5 mg total) by mouth every 4 (four) hours as needed for severe pain.    senna-docusate 8.6-50 MG tablet Commonly known as: Senokot-S Take 1 tablet by mouth 2 (two) times daily between meals as needed for mild constipation.        Follow-up Information     Anabel Halon, MD. Schedule an appointment as soon as possible for a visit in 10 day(s).   Specialty: Internal Medicine Contact information: [address] [place] [address] [phone]                Discharge Exam: Ceasar Mons Weights   02/28/23 1638  Weight: 131.5 kg   General exam: Alert, awake, oriented x 3; no chest pain, reporting anorexia.  No further episode of fever reported.  Feeling ready to go home. Respiratory system: Positive scattered rhonchi; no using accessory muscle.  No frank crackles. Cardiovascular system:RRR. No rubs or gallops. Gastrointestinal system: Abdomen is obese, nondistended, soft and nontender. No organomegaly or masses felt. Normal bowel sounds heard. Central nervous system: Alert and oriented. No focal neurological deficits. Extremities: No cyanosis or clubbing; trace edema appreciated bilaterally. Skin: No petechiae. Psychiatry: Judgement and insight appear normal. Mood & affect appropriate.   Condition at discharge: Stable and improved.  The results of significant diagnostics from this hospitalization (including imaging, microbiology, ancillary and laboratory) are listed below for reference.   Imaging Studies: DG Chest 2 View  Result Date: 03/01/2023 CLINICAL DATA:  Shortness of breath EXAM: CHEST - 2 VIEW COMPARISON:  Radiograph 02/28/2023 FINDINGS: No significant change from 02/28/2023. Low lung volumes. Consolidation in the medial right lower lung and posterior left lower lobe. No pleural effusion or pneumothorax. IMPRESSION: Bilateral lower lung airspace consolidation may be due to atelectasis or pneumonia. Electronically Signed   By: Minerva Fester M.D.   On: 03/01/2023 21:13   DG CHEST  PORT 1 VIEW  Result Date: 02/28/2023 CLINICAL DATA:   Dyspnea EXAM: PORTABLE CHEST 1 VIEW COMPARISON:  02/28/2023 FINDINGS: Pulmonary hypoinflation. The heart size and mediastinal contours are within normal limits. Both lungs are clear. The visualized skeletal structures are unremarkable. IMPRESSION: 1. No active disease. Electronically Signed   By: Helyn Numbers M.D.   On: 02/28/2023 23:19   DG Chest Port 1 View  Result Date: 02/28/2023 CLINICAL DATA:  Chronic lymphocytic leukemia EXAM: PORTABLE CHEST 1 VIEW COMPARISON:  X-ray 02/12/2023 FINDINGS: Underinflation. Mild right basilar atelectasis. No pneumothorax, effusion or edema. Enlarged cardiopericardial silhouette with vascular congestion. Degenerative changes along the spine. IMPRESSION: Underinflation.  Mild right basilar atelectasis. Enlarged heart with some central vascular congestion. Electronically Signed   By: Karen Kays M.D.   On: 02/28/2023 18:05   US BIOPSY (ABDOMINAL RETROPERTIONEAL MASS)  Result Date: 02/17/2023 INDICATION: 67 year old male with diffuse lymphadenopathy, history of small cell lymphocytic lymphoma. EXAM: Ultrasound-guided lymph node biopsy MEDICATIONS: None. ANESTHESIA/SEDATION: Moderate (conscious) sedation was employed during this procedure. A total of Versed 1 mg and Fentanyl 50 mcg was administered intravenously by the radiology nurse. Total intra-service moderate Sedation Time: 4 minutes. The patient's level of consciousness and vital signs were monitored continuously by radiology nursing throughout the procedure under my direct supervision. COMPLICATIONS: None immediate. PROCEDURE: Informed written consent was obtained from the patient after a thorough discussion of the procedural risks, benefits and alternatives. All questions were addressed. Maximal Sterile Barrier Technique was utilized including caps, mask, sterile gowns, sterile gloves, sterile drape, hand hygiene and skin antiseptic. A timeout was performed prior to the initiation of the procedure. Preprocedure  ultrasound evaluation demonstrated multifocal prominent, superficial left iliac lymph nodes. Procedure was planned. Subdermal Local anesthesia was provided 1% lidocaine. Deeper local anesthetic was administered to the periphery of the lymph node under direct ultrasound visualization. A 17 gauge Coaxial introducer needle was directed to the periphery of the targeted lymph node. A total of 3, 18 gauge core biopsies were then obtained and placed in saline. The coaxial needle was removed. Hemostasis was achieved with brief manual compression. Postprocedure ultrasound evaluation demonstrated no evidence of surrounding hematoma or other complicating features. The patient tolerated the procedure well was transferred to the recovery area in good condition. IMPRESSION: Technically successful ultrasound-guided left iliac lymph node biopsy. Marliss Coots, MD [facility] Electronically Signed   By: Marliss Coots M.D.   On: 02/17/2023 13:25   ECHOCARDIOGRAM COMPLETE  Result Date: 02/14/2023    ECHOCARDIOGRAM REPORT   Patient Name:   Adam Reyes Date of Exam: 02/14/2023 Medical Rec #:  [id number]     Height:       76.0 in Accession #:    [id number]    Weight:       269.2 lb Date of Birth:  1956/01/15      BSA:          2.513 m Patient Age:    67 years      BP:           97/52 mmHg Patient Gender: M             HR:           85 bpm. Exam Location:  Inpatient Procedure: 2D Echo, Cardiac Doppler, Color Doppler and Intracardiac            Opacification Agent Indications:    Atrial fibrillation  History:  Patient has prior history of Echocardiogram examinations, most                 recent 07/24/2021. CAD and Previous Myocardial Infarction; Risk                 Factors:Hypertension. Lymphoma.  Sonographer:    Milda Smart Referring Phys: [id number] Boyce Medici GONFA  Sonographer Comments: Image acquisition challenging due to patient body habitus and Image acquisition  challenging due to respiratory motion. IMPRESSIONS  1. Left ventricular ejection fraction, by estimation, is 70 to 75%. The left ventricle has hyperdynamic function. The left ventricle has no regional wall motion abnormalities. There is mild asymmetric left ventricular hypertrophy of the basal-septal segment. Peak LV outflow tract gradient 94 mmHg. Left ventricular diastolic parameters are consistent with Grade II diastolic dysfunction (pseudonormalization).  2. Right ventricular systolic function is normal. The right ventricular size is normal. Tricuspid regurgitation signal is inadequate for assessing PA pressure.  3. Left atrial size was mildly dilated.  4. There is mitral valve systolic anterior motion. The mitral valve is abnormal. Trivial mitral valve regurgitation. No evidence of mitral stenosis.  5. The aortic valve is tricuspid. Aortic valve regurgitation is mild to moderate. No aortic stenosis is present.  6. Aortic dilatation noted. There is mild dilatation of the ascending aorta, measuring 40 mm.  7. The inferior vena cava is normal in size with greater than 50% respiratory variability, suggesting right atrial pressure of 3 mmHg.  8. Vigorous LV systolic function with LVOT gradient and SAM. Patient has HOCM physiology. FINDINGS  Left Ventricle: Left ventricular ejection fraction, by estimation, is 70 to 75%. The left ventricle has hyperdynamic function. The left ventricle has no regional wall motion abnormalities. Definity contrast agent was given IV to delineate the left ventricular endocardial borders. The left ventricular internal cavity size was normal in size. There is mild asymmetric left ventricular hypertrophy of the basal-septal segment. Left ventricular diastolic parameters are consistent with Grade II diastolic  dysfunction (pseudonormalization). Right Ventricle: The right ventricular size is normal. No increase in right ventricular wall thickness. Right ventricular systolic function is  normal. Tricuspid regurgitation signal is inadequate for assessing PA pressure. Left Atrium: Left atrial size was mildly dilated. Right Atrium: Right atrial size was normal in size. Pericardium: There is no evidence of pericardial effusion. Mitral Valve: There is mitral valve systolic anterior motion. The mitral valve is abnormal. Trivial mitral valve regurgitation. No evidence of mitral valve stenosis. MV peak gradient, 6.0 mmHg. The mean mitral valve gradient is 3.0 mmHg. Tricuspid Valve: The tricuspid valve is normal in structure. Tricuspid valve regurgitation is trivial. Aortic Valve: The aortic valve is tricuspid. Aortic valve regurgitation is mild to moderate. Aortic regurgitation PHT measures 450 msec. No aortic stenosis is present. Aortic valve mean gradient measures 48.0 mmHg. Aortic valve peak gradient measures 93.7 mmHg. Pulmonic Valve: The pulmonic valve was normal in structure. Pulmonic valve regurgitation is trivial. Aorta: The aortic root is normal in size and structure and aortic dilatation noted. There is mild dilatation of the ascending aorta, measuring 40 mm. Venous: The inferior vena cava is normal in size with greater than 50% respiratory variability, suggesting right atrial pressure of 3 mmHg. IAS/Shunts: No atrial level shunt detected by color flow Doppler.  LEFT VENTRICLE PLAX 2D LVIDd:         5.50 cm      Diastology LVIDs:         3.10 cm      LV  e' medial:    6.96 cm/s LV PW:         1.00 cm      LV E/e' medial:  10.6 LV IVS:        0.80 cm      LV e' lateral:   8.16 cm/s LVOT diam:     2.40 cm      LV E/e' lateral: 9.1 LVOT Area:     4.52 cm  LV Volumes (MOD) LV vol d, MOD A2C: 135.0 ml LV vol d, MOD A4C: 130.0 ml LV vol s, MOD A2C: 33.0 ml LV vol s, MOD A4C: 38.4 ml LV SV MOD A2C:     102.0 ml LV SV MOD A4C:     130.0 ml LV SV MOD BP:      97.9 ml RIGHT VENTRICLE RV Basal diam:  2.60 cm RV S prime:     21.10 cm/s TAPSE (M-mode): 2.5 cm LEFT ATRIUM             Index        RIGHT ATRIUM            Index LA diam:        3.60 cm 1.43 cm/m   RA Area:     14.20 cm LA Vol (A2C):   83.1 ml 33.06 ml/m  RA Volume:   31.00 ml  12.33 ml/m LA Vol (A4C):   84.0 ml 33.42 ml/m LA Biplane Vol: 90.8 ml 36.13 ml/m  AORTIC VALVE AV Vmax:      484.00 cm/s AV Vmean:     329.000 cm/s AV VTI:       0.756 m AV Peak Grad: 93.7 mmHg AV Mean Grad: 48.0 mmHg AI PHT:       450 msec  AORTA Ao Root diam: 3.60 cm Ao Asc diam:  4.00 cm MITRAL VALVE MV Peak grad: 6.0 mmHg     SHUNTS MV Mean grad: 3.0 mmHg     Systemic Diam: 2.40 cm MV Vmax:      1.22 m/s MV Vmean:     81.3 cm/s MR Peak grad: 96.0 mmHg MR Vmax:      490.00 cm/s MV E velocity: 73.90 cm/s MV A velocity: 89.90 cm/s MV E/A ratio:  0.82 Dalton McleanMD Electronically signed by Wilfred Lacy Signature Date/Time: 02/14/2023/11:17:54 AM    Final    CT ABDOMEN LIMITED WO CONTRAST  Result Date: 02/13/2023 CLINICAL DATA:  Retroperitoneal biopsy, procedure deferred due to onset of AFib * Tracking Code: BO * EXAM: CT ABDOMEN WITHOUT CONTRAST LIMITED TECHNIQUE: Multidetector CT imaging of the abdomen was performed following the standard protocol without IV contrast. RADIATION DOSE REDUCTION: This exam was performed according to the departmental dose-optimization program which includes automated exposure control, adjustment of the mA and/or kV according to patient size and/or use of iterative reconstruction technique. COMPARISON:  CT abdomen pelvis, 02/12/2023 FINDINGS: Hepatobiliary: No solid liver abnormality is seen. No gallstones, gallbladder wall thickening, or biliary dilatation. Pancreas: Unremarkable. No pancreatic ductal dilatation or surrounding inflammatory changes. Spleen: Gross splenomegaly, maximum span 20.3 cm. Adrenals/Urinary Tract: Adrenal glands are unremarkable. Simple, benign left renal cortical cysts, for which no further follow-up or characterization is required kidneys are otherwise normal, without renal calculi, solid lesion, or hydronephrosis.  Stomach/Bowel: Stomach is within normal limits. No evidence of bowel wall thickening, distention, or inflammatory changes. Descending colonic diverticulosis. Vascular/Lymphatic: Aortic atherosclerosis. Unchanged bulky retrocrural, retroperitoneal, and bilateral iliac lymphadenopathy, with numerous additional prominent lymph nodes throughout the bowel  mesentery. Other: No abdominal wall hernia or abnormality. No ascites. Musculoskeletal: No acute or significant osseous findings. IMPRESSION: 1. Unchanged bulky retrocrural, retroperitoneal, and bilateral iliac lymphadenopathy, with numerous additional prominent lymph nodes throughout the bowel mesentery. 2. Gross splenomegaly, maximum span 20.3 cm. 3. Findings are unchanged in comparison to prior day's examination and most consistent with lymphoma. No acute findings. 4. Descending colonic diverticulosis without evidence of acute diverticulitis. Aortic Atherosclerosis (ICD10-I70.0). Electronically Signed   By: Jearld Lesch M.D.   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 Right lower quadrant abdominal pain. 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 * Tracking Code: BO * EXAM: CT ABDOMEN AND PELVIS WITH CONTRAST TECHNIQUE: Multidetector CT imaging of the abdomen and pelvis was performed using the standard protocol following bolus administration of intravenous contrast. RADIATION DOSE REDUCTION: This exam was performed according to the departmental dose-optimization program which includes automated exposure control, adjustment of the mA and/or kV according to patient size and/or use of iterative reconstruction technique. CONTRAST:  OMNIPAQUE IOHEXOL 300 MG/ML  SOLN COMPARISON:  PET-CT 02/06/2023. CT of the chest, abdomen and pelvis 01/24/2023 and 04/11/2022. FINDINGS: Lower chest: Stable linear atelectasis or scarring at both lung bases. Small  pulmonary nodules bilaterally are unchanged from recent prior imaging, measuring up to 8 mm in the right lower lobe and 9 mm in the left lower lobe, both on image 9/5. There is progressive subcarinal and distal paraesophageal adenopathy as seen on recent PET-CT. A distal right paraesophageal node measures up to 5.4 x 4.6 cm on image 14/2. Hepatobiliary: The liver is normal in density without suspicious focal abnormality. No evidence of gallstones, gallbladder wall thickening or biliary dilatation. Pancreas: Unremarkable. No pancreatic ductal dilatation or surrounding inflammatory changes. Spleen: Progressive splenomegaly compared with recent CT. No focal abnormality identified. Adrenals/Urinary Tract: Both adrenal glands appear normal. No evidence of urinary tract calculus, suspicious renal lesion or hydronephrosis. Unchanged cyst in the mid left kidney for which no follow-up imaging is recommended. Stable bladder compression by the bilateral pelvic lymphadenopathy. No intrinsic bladder abnormality identified. Stomach/Bowel: No enteric contrast administered. The stomach appears unremarkable for its degree of distension. No evidence of bowel wall thickening, distention or surrounding inflammatory change. Vascular/Lymphatic: Again demonstrated is extensive retroperitoneal, pelvic and bilateral inguinal adenopathy. No significant change is seen from the recent PET-CT, although some of the lymph nodes have enlarged from the diagnostic CT done 01/24/2023. For example, there is a left external iliac node measuring 4.5 cm short axis on image 86/2 which previously measured 3.7 cm. A presacral node measuring 3.1 cm on image 72/2 previously measured 2.3 cm. No nodal necrosis identified. Mild aortic and branch vessel atherosclerosis without evidence of aneurysm or large vessel occlusion. Reproductive: The prostate gland appears mildly enlarged, but stable. Other: No evidence of abdominal wall mass or hernia. No ascites or  pneumoperitoneum. Musculoskeletal: No acute or significant osseous findings. There is multilevel spondylosis with a grade 1 anterolisthesis at L5-S1 secondary to underlying pars defects. Associated prominent foraminal narrowing in the lower lumbar spine, unchanged. Unless specific follow-up recommendations are mentioned in the findings or impression sections, no imaging follow-up of any mentioned incidental findings is recommended. IMPRESSION: 1. Known extensive adenopathy in the lower chest, abdomen and pelvis has mildly progressed compared with the CTs of less than 3 weeks earlier consistent with progressive lymphoma. 2. Progressive splenomegaly. 3. Stable small pulmonary nodules at both  lung bases. 4. No definite acute findings. 5. 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 Associated prominent foraminal narrowing in the lower lumbar spine. 6.  Aortic Atherosclerosis (ICD10-I70.0). Electronically Signed   By: Carey Bullocks M.D.   On: 02/12/2023 12:16   DG Chest Port 1 View  Result Date: 02/12/2023 CLINICAL DATA:  Sepsis.  History of lymphoma EXAM: PORTABLE CHEST 1 VIEW COMPARISON:  PET-CT 02/06/2023 FINDINGS: Subtle bandlike opacity right lung base. Atelectasis is favored. No consolidation, pneumothorax or effusion. No edema. Normal cardiopericardial silhouette. Overlapping cardiac leads. IMPRESSION: Right basilar atelectasis.  No pneumothorax or effusion. Electronically Signed   By: Karen Kays M.D.   On: 02/12/2023 11:29   NM PET Image Restag (PS) Skull Base To Thigh  Result Date: 02/12/2023 CLINICAL DATA:  Subsequent treatment strategy for small lymphocytic lymphoma. EXAM: NUCLEAR MEDICINE PET SKULL BASE TO THIGH TECHNIQUE: 13.3 mCi F-18 FDG was injected intravenously. Full-ring PET imaging was performed from the skull base to thigh after the radiotracer. CT data was obtained and used for attenuation correction and anatomic localization.  Fasting blood glucose: 105 mg/dl COMPARISON:  CT 4/69/62 FINDINGS: Mediastinal blood pool activity: SUV max 2.51 Liver activity: SUV max 3.29 NECK: Bilateral cervical adenopathy is identified with corresponding mild increased uptake. -right level 2 node measures 1.6 cm with SUV max of 2.75. -Left level 3 lymph node measures 1.3 cm with SUV max of 2.69, image 59/202. Incidental CT findings: None. CHEST: Left superior mediastinal prevascular lymph node measures 1.7 cm with SUV max of 3.93, image 86/202. Multiple tracer avid posterior mediastinal lymph nodes are identified. -aorto esophageal lymph node measures 1.8 cm with SUV max of 3.99. -Right posterior mediastinal node just above the GE junction measures 3.2 cm with SUV max of 4.71, image 148/202. -Left lower posterior mediastinal node measures 3.4 cm with SUV max 4.59, image 148/202. Bilateral retropectoral adenopathy is identified which exhibits mild tracer uptake. -Right retropectoral lymph node measures 1.5 cm with SUV max of 1.82, image 90/202. -Left retropectoral lymph node measures 1.4 cm with SUV max of 1.98, image 94/202. Small bilateral lung nodules are again noted. -Index nodule within the anterior left upper lobe measures 7 mm with SUV max of 0.89, image 88/202. -1 cm nodule within the basilar right upper lobe has an SUV max of 0.71, image 98/202. Incidental CT findings: Aortic atherosclerosis and coronary artery calcifications. ABDOMEN/PELVIS: There is extensive tracer avid abdominopelvic adenopathy. -left retrocrural lymph node measures 2.4 cm with SUV max of 5.19, image 165/202. -Left retroperitoneal nodal conglomeration measures 9.2 x 6.5 cm with SUV max of 10.76, image 200/202. -Right common iliac lymph node measures 1.9 cm with SUV max of 4.65, image 238/202 -Left inguinal lymph node measures 1.7 cm with SUV max of 3.07, image 286/202. -Large nodal conglomeration within the left external iliac chain measures 11.3 x 6.9 cm with an SUV max 8.89,  image 260/202. Diffuse increased uptake within the spleen has an SUV max 3.67. The spleen is enlarged measuring 18.09 cm in cranial caudal dimension. No abnormal tracer uptake identified within the liver, pancreas, or adrenal glands. Incidental CT findings: Left kidney cyst measures 6.3 cm, image 190/202. Aortic atherosclerotic calcifications. SKELETON: No focal hypermetabolic activity to suggest skeletal metastasis. Incidental CT findings: None. IMPRESSION: 1. Extensive tracer avid adenopathy within the chest, abdomen, and pelvis compatible with lymphoma. Deauville criteria 4/5. 2. Splenomegaly with diffuse increased uptake concerning for splenic involvement. Deauville criteria 4 3. Bilateral lung  nodules are not significantly tracer avid. However these lung nodules are too small to reliably characterize by PET-CT. 4. Aortic Atherosclerosis (ICD10-I70.0). Electronically Signed   By: Signa Kell M.D.   On: 02/12/2023 09:46   US Venous Img Lower Unilateral Left  Result Date: 02/04/2023 CLINICAL DATA:  Swelling left leg EXAM: Left LOWER EXTREMITY VENOUS DOPPLER ULTRASOUND TECHNIQUE: Gray-scale sonography with compression, as well as color and duplex ultrasound, were performed to evaluate the deep venous system(s) from the level of the common femoral vein through the popliteal and proximal calf veins. COMPARISON:  None Available. FINDINGS: VENOUS Normal compressibility of the common femoral, superficial femoral, and popliteal veins, as well as the visualized calf veins. Visualized portions of profunda femoral vein and great saphenous vein unremarkable. No filling defects to suggest DVT on grayscale or color Doppler imaging. Doppler waveforms show normal direction of venous flow, normal respiratory plasticity and response to augmentation. Limited views of the contralateral common femoral vein are unremarkable. OTHER Of note there is some slow flow in the left popliteal vein, nonspecific. This vein also is  distended. Limitations: none IMPRESSION: No evidence of left lower extremity DVT. Nonspecific finding of distended left popliteal vein with slow flow Electronically Signed   By: Karen Kays M.D.   On: 02/04/2023 10:15    Microbiology: Results for orders placed or performed during the hospital encounter of 02/12/23  Blood Culture (routine x 2)     Status: None   Collection Time: 02/12/23 10:54 AM   Specimen: BLOOD  Result Value Ref Range Status   Specimen Description BLOOD BLOOD RIGHT HAND  Final   Special Requests   Final    BOTTLES DRAWN AEROBIC AND ANAEROBIC Blood Culture results may not be optimal due to an excessive volume of blood received in culture bottles   Culture   Final    NO GROWTH 5 DAYS Performed at [facility], [address] [place], [place] [address]    Report Status 02/17/2023 FINAL  Final  Blood Culture (routine x 2)     Status: None   Collection Time: 02/12/23 10:54 AM   Specimen: BLOOD  Result Value Ref Range Status   Specimen Description BLOOD BLOOD LEFT ARM  Final   Special Requests   Final    BOTTLES DRAWN AEROBIC AND ANAEROBIC Blood Culture adequate volume   Culture   Final    NO GROWTH 5 DAYS Performed at [facility], [address] [place], [place] [address]    Report Status 02/17/2023 FINAL  Final  Urine Culture     Status: Abnormal   Collection Time: 02/12/23  8:23 PM   Specimen: Urine, Random  Result Value Ref Range Status   Specimen Description   Final    URINE, RANDOM Performed at [facility], [address], [place], [place] [address]    Special Requests   Final    NONE Reflexed from [id number] Performed at [facility], [address] [place], [place] [address]    Culture MULTIPLE SPECIES PRESENT, SUGGEST RECOLLECTION (A)  Final   Report Status 02/14/2023 FINAL  Final  MRSA Next Gen by PCR, Nasal     Status: None   Collection Time: 02/13/23  4:40 PM   Specimen: Nasal Mucosa; Nasal Swab  Result Value Ref Range Status   MRSA by PCR Next Gen  NOT DETECTED NOT DETECTED Final    Comment: (NOTE) The GeneXpert MRSA Assay (FDA approved for NASAL specimens only), is one component of a comprehensive MRSA  colonization surveillance program. It is not intended to diagnose MRSA infection nor to guide or monitor treatment for MRSA infections. Test performance is not FDA approved in patients less than 35 years old. Performed at [facility], [address] [place], [place] [address]     Labs: CBC: Recent Labs  Lab 02/25/23 1455 03/01/23 0250  WBC 20.0* 2.8*  NEUTROABS 8.0*  --   HGB 13.7 11.1*  HCT 41.3 33.3*  MCV 100.5* 100.3*  PLT 101* 41*   Basic Metabolic Panel: Recent Labs  Lab 02/25/23 1455 02/27/23 0845 02/28/23 0832 02/28/23 1901 03/01/23 0250 03/01/23 1056 03/01/23 1759 03/02/23 0259 03/02/23 0546 03/02/23 1134  NA 130* 129* 128* 129* 131* 131* 130*  --  130*  --   K 5.1 4.7 4.7 4.8 4.9 4.9 4.6  --  4.3  --   CL 97* 97* 99 97* 103 101 100  --  100  --   CO2 21* 22 18* 15* 19* 19* 19*  --  21*  --   GLUCOSE 149* 113* 173* 186* 119* 89 110*  --  85  --   BUN 34* 43* 57* 58* 55* 60* 58*  --  57*  --   CREATININE 1.49* 1.45* 1.48* 1.54* 1.46* 1.60* 1.47*  --  1.38*  --   CALCIUM 8.9 8.6* 8.1* 7.6* 7.9* 7.9* 7.4*  --  7.5*  --   MG 2.9* 2.8* 2.9*  --   --   --   --   --   --   --   PHOS 4.6 4.3 5.4* 5.2* 5.4* 5.3* 4.9* 5.1*  --  4.9*   Liver Function Tests: Recent Labs  Lab 02/25/23 1455 02/27/23 0845 02/28/23 0832  AST 53* 48* 67*  ALT 45* 45* 46*  ALKPHOS 162* 157* 145*  BILITOT 2.7* 2.4* 2.3*  PROT 5.8* 5.1* 4.9*  ALBUMIN 3.3* 2.9* 2.8*   CBG: No results for input(s): "GLUCAP" in the last 168 hours.  Discharge time spent: greater than 30 minutes.  Signed: Vassie Loll, MD [facility] 03/02/2023

## 2023-03-02 NOTE — Plan of Care (Signed)

## 2023-03-02 NOTE — TOC Progression Note (Addendum)
Transition of Care [facility]) - Progression Note    Patient Details  Name: Adam Reyes MRN: [id number] Date of Birth: [birth year]  [facility] CM/SW Contact  Catalina [place], [place] Phone Number: 03/02/2023, 2:27 PM  Clinical Narrative:    Pt is high risk for readmission. CSW completed assessment with pt and  spouse. Pt has the support of spouse to assist with  ADLs. Pts spouse is able to provide transportation to appointments when needed. They live in a single family home, Pt has a walker. See if this is equipment covered.  TOC to follow.   Addendum: CSW reached out to [employer] regarding DME.   Expected Discharge Plan: Home/Self Care Barriers to Discharge: Continued Medical Work up  Expected Discharge Plan and Services In-house Referral: Clinical Social Work     Living arrangements for the past 2 months: Single Family Home                           HH Arranged: NA           Social Determinants of Health (SDOH) Interventions SDOH Screenings   Food Insecurity: No Food Insecurity (02/28/2023)  Housing: Low Risk  (02/28/2023)  Transportation Needs: No Transportation Needs (02/28/2023)  Utilities: Not At Risk (02/28/2023)  Alcohol Screen: Low Risk  (05/16/2020)  Depression (PHQ2-9): Low Risk  (02/26/2023)  Financial Resource Strain: Low Risk  (05/16/2020)  Physical Activity: Sufficiently Active (05/16/2020)  Social Connections: Moderately Integrated (05/16/2020)  Stress: No Stress Concern Present (05/16/2020)  Tobacco Use: Medium Risk (02/28/2023)    Readmission Risk Interventions    03/01/2023    3:13 PM  Readmission Risk Prevention Plan  Transportation Screening Complete  PCP or Specialist Appt within 5-7 Days Complete  Home Care Screening Complete  Medication Review (RN CM) Complete

## 2023-03-03 ENCOUNTER — Other Ambulatory Visit: Payer: Self-pay | Admitting: *Deleted

## 2023-03-03 ENCOUNTER — Telehealth: Payer: Self-pay | Admitting: *Deleted

## 2023-03-03 MED ORDER — LOPERAMIDE HCL 2 MG PO CAPS: 2.0000 mg | ORAL_CAPSULE | ORAL | 0 refills | Status: DC | PRN

## 2023-03-03 NOTE — Telephone Encounter (Signed)
Patient called to make [place] aware that he fell this morning.  Denies injury and said he is "feeling fine". States he was using walker and lost his balance.  Denies loc.  Advised to go to urgent care should he develop pain indicating acute injury.  Verbalized understanding.

## 2023-03-03 NOTE — Progress Notes (Signed)
Patient called back to advise that he has been having diarrhea since last chemo treatment.  Per standing orders Imodium sent to pharmacy and instructions given on use.  Verbalized understanding.

## 2023-03-04 ENCOUNTER — Telehealth: Payer: Self-pay

## 2023-03-04 NOTE — Progress Notes (Signed)
24 HOUR CHEMOTHERAPY CALL BACK:  Patient states that he has been very weak since his post chemotherapy inpatient admission over the weekend.  He has had multiple falls since being discharged.  He has also had diarrhea, but Imodium has resolved this.  MD made aware.  Patient advised to call the office with any new symptoms or concerns.

## 2023-03-04 NOTE — Transitions of Care (Post Inpatient/ED Visit) (Signed)
03/04/2023  Name: Adam Reyes MRN: [id number] DOB: 02/07/1956  Today's TOC FU Call Status: Today's TOC FU Call Status:: Successful TOC FU Call Completed TOC FU Call Complete Date: 03/04/23  Transition Care Management Follow-up Telephone Call Date of Discharge: 03/02/23 [facility] Type of Discharge: Inpatient Admission Primary Inpatient Discharge Diagnosis:: Tumor Lysis Syndrome How have you been since you were released from the hospital?: Better (Patient is weak but feels his diarrhea is getting better) Any questions or concerns?: No  Items Reviewed: Did you receive and understand the discharge instructions provided?: Yes Medications obtained,verified, and reconciled?: Yes (Medications Reviewed) Any new allergies since your discharge?: No Dietary orders reviewed?: Yes Type of Diet Ordered:: General Do you have support at home?: Yes People in Home: sibling(s) Name of Support/Comfort Primary Source: Ann  Medications Reviewed Today: Medications Reviewed Today     Reviewed by Jodelle Gross, RN (Case Manager) on 03/04/23 at 1258  Med List Status: <None>   Medication Order Taking? Sig Documenting Provider Last Dose Status Informant  allopurinol (ZYLOPRIM) 300 MG tablet [id number] Yes Take 1 tablet (300 mg total) by mouth 2 (two) times daily. Vassie Loll, MD Taking Active   amiodarone (PACERONE) 200 MG tablet [id number] Yes Take 2 tablets (400 mg total) by mouth 2 (two) times daily for 7 days, THEN 1 tablet (200 mg total) daily. Almon Hercules, MD Taking Active Spouse/Significant Other  apixaban (ELIQUIS) 5 MG TABS tablet [id number] Yes Take 1 tablet (5 mg total) by mouth 2 (two) times daily. Almon Hercules, MD Taking Active Spouse/Significant Other  atorvastatin (LIPITOR) 40 MG tablet [id number] Yes TAKE ONE (1) TABLET BY MOUTH EVERY DAY Marykay Lex, MD Taking Active Spouse/Significant Other  cholecalciferol (VITAMIN D3) 25 MCG (1000 UNIT) tablet  [id number] Yes Take 5,000 Units by mouth every other day. [provider] Taking Active Spouse/Significant Other           Med Note Elesa Massed, Illene Labrador   Fri Feb 28, 2023  5:42 PM)    ferrous sulfate 324 (65 Fe) MG TBEC [id number] Yes Take 324 mg by mouth every other day. [provider] Taking Active Spouse/Significant Other           Med Note Izola Price, TOMI A   Thu Dec 14, 2020  9:58 AM)    loperamide (IMODIUM) 2 MG capsule [id number] Yes Take 1 capsule (2 mg total) by mouth as needed for diarrhea or loose stools (2 caps after first loose stool and 1 after each loose stool therafter, not to exceed 8 in a 24 hour period of time.). Doreatha Massed, MD Taking Active   oxyCODONE (OXY IR/ROXICODONE) 5 MG immediate release tablet [id number] Yes Take 1 tablet (5 mg total) by mouth every 4 (four) hours as needed for severe pain. Doreatha Massed, MD Taking Active Spouse/Significant Other  senna-docusate (SENOKOT-S) 8.6-50 MG tablet [id number] No Take 1 tablet by mouth 2 (two) times daily between meals as needed for mild constipation.  Patient not taking: Reported on 03/04/2023   Almon Hercules, MD Not Taking Active Spouse/Significant Other  Med List Note Remi Haggard, RPH-CPP 05/24/19 1058): Imbruvica filled at [facility]            [facility] and Equipment/Supplies: Were Home Health Services Ordered?: No Any new equipment or medical supplies ordered?: No  Functional Questionnaire: Do you need assistance with bathing/showering or dressing?: No Do you need assistance with meal preparation?: No Do you need assistance  with eating?: No Do you have difficulty maintaining continence: No Do you need assistance with getting out of bed/getting out of a chair/moving?: No Do you have difficulty managing or taking your medications?: No  Follow up appointments reviewed: PCP Follow-up appointment confirmed?: No (Patient does not feel he needs to see PCP at this time.  He  has many appointments with the cancer center.) MD Provider Line [phone] Given: No Specialist Hospital Follow-up appointment confirmed?: Yes Date of Specialist follow-up appointment?: 03/06/23 Follow-Up Specialty Provider:: Dr. Ellin Saba Do you need transportation to your follow-up appointment?: No Do you understand care options if your condition(s) worsen?: Yes-patient verbalized understanding  SDOH Interventions Today    Flowsheet Row Most Recent Value  SDOH Interventions   Food Insecurity Interventions Intervention Not Indicated  Housing Interventions Intervention Not Indicated  Transportation Interventions Intervention Not Indicated     Jodelle Gross, RN, BSN, CCM Care Management Coordinator [facility] Phone: [phone]/Fax: [phone]

## 2023-03-05 LAB — FISH HES LEUKEMIA, 4Q12 REA

## 2023-03-05 NOTE — Progress Notes (Signed)
[facility] [address] [place] [address]    Clinic Day:  03/06/23      Referring physician: Anabel Halon, MD  Patient Care Team: Anabel Halon, MD as PCP - General (Internal Medicine) Marykay Lex, MD as PCP - Cardiology (Cardiology) Doreatha Massed, MD as Consulting Physician (Oncology)   ASSESSMENT & PLAN:   Assessment: 1.  Stage IV small lymphocytic lymphoma: -CLL FISH panel normal, Ig HV positive, T p53 negative. -Ibrutinib 420 mg started on 12/01/2017, 6 cycles of rituximab from 12/14/2017 through 05/21/2018, ibrutinib discontinued on 01/30/2023 with progression. -PET scan (02/06/2023): Left retroperitoneal nodal conglomerate measuring 9.2 x 6.5 cm with SUV 10.76.  Splenomegaly measuring 18 cm with SUV 3.67.  Large nodal mass in the left external iliac chain measures 11.3 x 6.9 cm, SUV 8.89. - Left iliac lymph node biopsy (02/17/2023): CLL/SLL. - C1 day 1 obinutuzumab on 02/27/2023   2.  Health maintenance: - Colonoscopy on 10/10/2020 showed multiple polyps with no bleeding areas. - EGD on 10/10/2020 showed normal esophagus, few gastric polyps, gastric erosions with no stigmata of recent bleeding. - Push enteroscopy on 11/14/2020 did not reveal any bleeding issues.  Plan: 1.  Stage IV small lymphocytic lymphoma: - He received first weekly dose of obinutuzumab on 02/27/2023. - He was hospitalized for TLS prophylaxis and discharged on 03/02/2023. - He reports that he is feeling weak.  He had 1 fall at home when he slipped while getting up to go to the bathroom.  No serious injuries.  He uses walker for ambulation at home. - He does not have a great appetite but he is eating reasonably well. - Reviewed labs today: Creatinine normalized at 1.18.  Uric acid is 6.1 and stable.  Bilirubin is 2.7.  Potassium is 4.5. - CBC shows white count is normalized.  However he developed thrombocytopenia with platelet count 29. - Will hold obinutuzumab today.  Will  recheck labs next week.    2.  Left thigh and hip pain: - Continue oxycodone 5 mg every 4 hours as needed.  Continue Senokot for constipation.   3.  A-fib with RVR: - Continue amiodarone and Eliquis.  4.  TLS prophylaxis: - Continue allopurinol 300 mg daily.  TLS labs today are normal.  5.  Hypertension: - Blood pressure today is 105/62.  Continue to hold BP medications.    Orders Placed This Encounter  Procedures   Home Health    Order Specific Question:   To provide the following care/treatments    Answer:   PT    Order Specific Question:   To provide the following care/treatments    Answer:   OT    Order Specific Question:   To provide the following care/treatments    Answer:   RN    Order Specific Question:   To provide the following care/treatments    Answer:   Social work    Order Specific Question:   To provide the following care/treatments    Answer:   Home Health Aide   Face-to-face encounter (required for Medicare/Medicaid patients)    I Doreatha Massed, MD certify that this patient is under my care and that I, or a nurse practitioner or physician's assistant working with me, had a face-to-face encounter that meets the physician face-to-face encounter requirements with this patient on 03/06/2023. The encounter with the patient was in whole, or in part for the following medical condition(s) which is the primary reason for home  health care (List medical condition): CLL/Small Lymphocytic Lymphoma    Order Specific Question:   The encounter with the patient was in whole, or in part, for the following medical condition, which is the primary reason for home health care    Answer:   CLL/SLL    Order Specific Question:   I certify that, based on my findings, the following services are medically necessary home health services    Answer:   Nursing    Order Specific Question:   I certify that, based on my findings, the following services are medically necessary home health  services    Answer:   Physical therapy    Order Specific Question:   Reason for Medically Necessary Home Health Services    Answer:   Therapy- Instruction on use of Assistive Device for Ambulation on all Surfaces    Order Specific Question:   Reason for Medically Necessary Home Health Services    Answer:   Therapy- Home Adaptation to Facilitate Safety    Order Specific Question:   Reason for Medically Necessary Home Health Services    Answer:   Therapy- Therapeutic Exercises to Increase Strength and Endurance    Order Specific Question:   My clinical findings support the need for the above services    Answer:   Unable to leave home safely without assistance and/or assistive device    Order Specific Question:   My clinical findings support the need for the above services    Answer:   Unsafe ambulation due to balance issues    Order Specific Question:   Further, I certify that my clinical findings support that this patient is homebound due to:    Answer:   Immunocompromised    Order Specific Question:   Further, I certify that my clinical findings support that this patient is homebound due to:    Answer:   Ambulates short distances less than 300 feet    Order Specific Question:   Further, I certify that my clinical findings support that this patient is homebound due to:    Answer:   Unsafe ambulation due to balance issues       I,Helena R Teague,acting as a scribe for Doreatha Massed, MD.,have documented all relevant documentation on the behalf of Doreatha Massed, MD,as directed by  Doreatha Massed, MD while in the presence of Doreatha Massed, MD.  I, Doreatha Massed MD, have reviewed the above documentation for accuracy and completeness, and I agree with the above.       Doreatha Massed, MD   8/22/20246:04 PM  CHIEF COMPLAINT:   Diagnosis: small lymphocytic lymphoma and symptomatic anemia    Cancer Staging  No matching staging information was found for  the patient.    Prior Therapy: Rituximab monthly from 01/01/2018 to 05/21/2018   Current Therapy:  Ibrutinib 420 mg daily; intermittent Feraheme last on 09/29/2020    HISTORY OF PRESENT ILLNESS:   Oncology History  Chronic lymphocytic leukemia (CLL), B-cell (HCC)  06/14/2015 Imaging   CT neck- Bulky adenopathy throughout the neck bilaterally. There also are enlarged parotid lymph nodes bilaterally. There is bilateral axillary adenopathy as well as mediastinal adenopathy. Findings are consistent with lymphoma. Biopsy recommended.   06/27/2015 Procedure   Left neck lymph node biopsy by Dr. Suszanne Conners   06/27/2015 Pathology Results   Diagnosis Lymph node for lymphoma, Left neck node for lymphoma work up - SMALL LYMPHOCYTIC LYMPHOMA.  LOW GRADE.   06/27/2015 Pathology Results   Tissue-Flow Cytometry - MONOCLONAL  B CELL POPULATION IDENTIFIED. The phenotypic features are consistent with small lymphocytic lymphoma/chronic lymphocytic leukemia and correlate well with the morphology in the lymph node   02/27/2023 -  Chemotherapy   Patient is on Treatment Plan : LYMPHOMA CLL/SLL Venetoclax + Obinutuzumab q28d     Lymphoma, small lymphocytic (HCC)  10/14/2017 Initial Diagnosis   Lymphoma, small lymphocytic (HCC)   01/01/2018 - 05/21/2018 Chemotherapy   The patient had riTUXimab (RITUXAN) 900 mg in sodium chloride 0.9 % 250 mL (2.6471 mg/mL) infusion, 375 mg/m2 = 900 mg, Intravenous,  Once, 6 of 6 cycles Dose modification: 500 mg/m2 (original dose 500 mg/m2, Cycle 2, Reason: Provider Judgment) Administration: 900 mg (01/01/2018), 1,300 mg (01/29/2018), 1,300 mg (02/26/2018), 1,300 mg (03/26/2018), 1,300 mg (04/23/2018), 1,300 mg (05/21/2018)  for chemotherapy treatment.       INTERVAL HISTORY:   Adam Reyes is a 67 y.o. male presenting to clinic today for follow up of small lymphocytic lymphoma and symptomatic anemia. He was last seen by me on 02/25/23.  Since his last visit, he was admitted to the ED on  02/28/23.  Today, he states that he is doing well overall. His appetite level is at 40%. His energy level is at 25%. He is accompanied by his mother.   His mother notes he eats a little, and has not had any Boost or Ensure this week. He is attempting to drink fluids. He denies nausea and vomiting. He reports feeling hot at this visit, and needs a cool compress and a handheld fan to cool off. He does not believe his swelling in the LLE has improved since his last visit.   He c/o worsened weakness and cannot get up by himself. He currently walks with a walker to the bathroom, though he requires help to stand up and down. He is willing to try at home physical therapy. He goes to the restroom to urinate 8-10 times a day.   His mother notes he fell yesterday when attempting to stand up on his own. His mother reports he has dark stools and diarrhea with loose stools. He reports attempting to move around.   PAST MEDICAL HISTORY:   Past Medical History: Past Medical History:  Diagnosis Date   Chronic lymphocytic leukemia (CLL), B-cell (HCC)    Small Cell Lymphoma   Coronary artery disease, non-occlusive 02/2017   Cardiac cath in setting of MI: 50 and 55% bifurcation LAD-Diag1   Enlarged lymph node    left neck   Hypertension    STEMI (ST elevation myocardial infarction) (HCC) 03/05/2017   hx/notes 03/05/2017 -likely aborted anterior STEMI with 50% bifurcation LAD-Diag1.  No PCI.  Preserved EF   Syncope due to orthostatic hypotension 04/21/2018    Surgical History: Past Surgical History:  Procedure Laterality Date   BIOPSY  10/10/2020   Procedure: BIOPSY;  Surgeon: Dolores Frame, MD;  [facility];  Service: Gastroenterology;;   BIOPSY  11/14/2020   Procedure: BIOPSY;  Surgeon: Dolores Frame, MD;  [facility]: [facility];  Service: Gastroenterology;;   COLONOSCOPY N/A 10/11/2015   Procedure: COLONOSCOPY;  Surgeon: Malissa Hippo, MD;  [facility]: [facility];   Service: Endoscopy;  Laterality: N/A;  10/11/2015   COLONOSCOPY WITH PROPOFOL N/A 10/10/2020   Procedure: COLONOSCOPY WITH PROPOFOL;  Surgeon: Dolores Frame, MD;  [facility]: [facility];  Service: Gastroenterology;  Laterality: N/A;  AM   ENTEROSCOPY N/A 11/14/2020   Procedure: push enteroscopy;  Surgeon: Dolores Frame, MD;  Location:  [facility];  Service: Gastroenterology;  Laterality: N/A;   ESOPHAGOGASTRODUODENOSCOPY (EGD) WITH PROPOFOL N/A 10/10/2020   Procedure: ESOPHAGOGASTRODUODENOSCOPY (EGD) WITH PROPOFOL;  Surgeon: Dolores Frame, MD;  [facility];  Service: Gastroenterology;  Laterality: N/A;   ESOPHAGOGASTRODUODENOSCOPY (EGD) WITH PROPOFOL N/A 11/14/2020   Procedure: ESOPHAGOGASTRODUODENOSCOPY (EGD) WITH PROPOFOL;  Surgeon: Dolores Frame, MD;  [facility];  Service: Gastroenterology;  Laterality: N/A;  12:00   GIVENS CAPSULE STUDY N/A 11/07/2020   Procedure: GIVENS CAPSULE STUDY;  Surgeon: Dolores Frame, MD;  [facility]: [facility];  Service: Gastroenterology;  Laterality: N/A;  7:30 am   Graded Exercise Tolerance Test (GXT/ETT)  05/2017   10.7 METs (9: 25 min.  Reached 103% max predicted heart rate).  No EKG findings to suggest coronary ischemia.  Negative, low risk GXT   HEMOSTASIS CLIP PLACEMENT  11/14/2020   Procedure: HEMOSTASIS CLIP PLACEMENT;  Surgeon: Dolores Frame, MD;  [facility];  Service: Gastroenterology;;  small bowel nodule   LEFT HEART CATH AND CORONARY ANGIOGRAPHY N/A 03/05/2017   Procedure: LEFT HEART CATH AND CORONARY ANGIOGRAPHY;  Surgeon: Marykay Lex, MD;  Location: [facility];  Service: Cardiovascular: pLAD-Diag1 50-55% (non-flow-limiiting).  EF ~50-55%.  although bifurcation lesion was presumed Culprit - no PCI (not flow limiting). - Med Rx.   MASS BIOPSY Left 06/27/2015   Procedure: OPEN LEFT NECK BIOPSY ;  Surgeon: Newman Pies, MD;  Location: [facility];  Service: ENT;  Laterality: Left;   POLYPECTOMY  10/10/2020   Procedure: POLYPECTOMY;  Surgeon: Dolores Frame, MD;  [facility]: [facility];  Service: Gastroenterology;;   REFRACTIVE SURGERY Bilateral     Social History: Social History   Socioeconomic History   Marital status: Married    Spouse name: Not on file   Number of children: Not on file   Years of education: Not on file   Highest education level: Not on file  Occupational History   Not on file  Tobacco Use   Smoking status: Former    Types: Cigarettes   Smokeless tobacco: Former    Types: Chew, Snuff   Tobacco comments:    03/06/2017 "quit smoking when I was young; quit chew/snuff in ~ 2008"  Vaping Use   Vaping status: Never Used  Substance and Sexual Activity   Alcohol use: Yes    Alcohol/week: 2.0 standard drinks of alcohol    Types: 2 Cans of beer per week    Comment: 2-3 drinks weekly   Drug use: No   Sexual activity: Yes  Other Topics Concern   Not on file  Social History Narrative   Married since 2001,third.Lives with wife.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   Social Determinants of Health   Financial Resource Strain: Low Risk  (05/16/2020)   Overall Financial Resource Strain (CARDIA)    Difficulty of Paying Living Expenses: Not hard at all  Food Insecurity: No Food Insecurity (03/04/2023)   Hunger Vital Sign    Worried About Running Out of Food in the Last Year: Never true    Ran Out of Food in the Last Year: Never true  Transportation Needs: No Transportation Needs (03/04/2023)   PRAPARE - Administrator, Civil Service (Medical): No    Lack of Transportation (Non-Medical): No  Physical Activity: Sufficiently Active (05/16/2020)   Exercise Vital Sign    Days of Exercise per Week: 5 days    Minutes of Exercise per Session: 30  min  Stress: No Stress Concern Present (05/16/2020)   [employer] of Occupational Health - Occupational Stress Questionnaire    Feeling of Stress  : Not at all  Social Connections: Moderately Integrated (05/16/2020)   Social Connection and Isolation Panel [NHANES]    Frequency of Communication with Friends and Family: More than three times a week    Frequency of Social Gatherings with Friends and Family: More than three times a week    Attends Religious Services: More than 4 times per year    Active Member of [employer] or Organizations: No    Attends Banker Meetings: Never    Marital Status: Married  Catering manager Violence: Not At Risk (02/28/2023)   Humiliation, Afraid, Rape, and Kick questionnaire    Fear of Current or Ex-Partner: No    Emotionally Abused: No    Physically Abused: No    Sexually Abused: No    Family History: Family History  Problem Relation Age of Onset   Diabetes Mother    Cancer Father    Cancer Brother     Current Medications:  Current Outpatient Medications:    allopurinol (ZYLOPRIM) 300 MG tablet, Take 1 tablet (300 mg total) by mouth 2 (two) times daily., Disp: 60 tablet, Rfl: 2   amiodarone (PACERONE) 200 MG tablet, Take 2 tablets (400 mg total) by mouth 2 (two) times daily for 7 days, THEN 1 tablet (200 mg total) daily., Disp: 111 tablet, Rfl: 0   apixaban (ELIQUIS) 5 MG TABS tablet, Take 1 tablet (5 mg total) by mouth 2 (two) times daily., Disp: 60 tablet, Rfl: 2   atorvastatin (LIPITOR) 40 MG tablet, TAKE ONE (1) TABLET BY MOUTH EVERY DAY, Disp: 90 tablet, Rfl: 3   cholecalciferol (VITAMIN D3) 25 MCG (1000 UNIT) tablet, Take 5,000 Units by mouth every other day., Disp: , Rfl:    ferrous sulfate 324 (65 Fe) MG TBEC, Take 324 mg by mouth every other day., Disp: , Rfl:    loperamide (IMODIUM) 2 MG capsule, Take 1 capsule (2 mg total) by mouth as needed for diarrhea or loose stools (2 caps after first loose stool and 1 after each loose stool therafter, not to exceed 8 in a 24 hour period of time.)., Disp: 30 capsule, Rfl: 0   oxyCODONE (OXY IR/ROXICODONE) 5 MG immediate release tablet, Take  1 tablet (5 mg total) by mouth every 4 (four) hours as needed for severe pain., Disp: 84 tablet, Rfl: 0   senna-docusate (SENOKOT-S) 8.6-50 MG tablet, Take 1 tablet by mouth 2 (two) times daily between meals as needed for mild constipation., Disp: , Rfl:    Allergies: Allergies  Allergen Reactions   Gazyva [Obinutuzumab] Shortness Of Breath and Other (See Comments)    Rigors; see progress note from 02/27/2023    REVIEW OF SYSTEMS:   Review of Systems  Constitutional:  Negative for chills, fatigue and fever.  HENT:   Negative for lump/mass, mouth sores, nosebleeds, sore throat and trouble swallowing.   Eyes:  Negative for eye problems.  Respiratory:  Positive for cough and shortness of breath.   Cardiovascular:  Positive for leg swelling (left). Negative for chest pain and palpitations.  Gastrointestinal:  Positive for diarrhea (loose stools). Negative for abdominal pain, constipation, nausea and vomiting.       +dark stools  Genitourinary:  Negative for bladder incontinence, difficulty urinating, dysuria, frequency, hematuria and nocturia.   Musculoskeletal:  Negative for arthralgias, back pain, flank pain, myalgias and neck pain.  Skin:  Negative for itching and rash.  Neurological:  Positive for numbness (in toes). Negative for dizziness and headaches.  Hematological:  Does not bruise/bleed easily.  Psychiatric/Behavioral:  Negative for depression, sleep disturbance and suicidal ideas. The patient is not nervous/anxious.   All other systems reviewed and are negative.    VITALS:   Blood pressure 105/62, pulse (!) 101, temperature (!) 97.5 F (36.4 C), resp. rate 20, SpO2 93%.  Wt Readings from Last 3 Encounters:  02/18/23 278 lb (126.1 kg)  02/12/23 268 lb (121.6 kg)  01/30/23 270 lb (122.5 kg)    There is no height or weight on file to calculate BMI.  Performance status (ECOG): 1 - Symptomatic but completely ambulatory  PHYSICAL EXAM:   Physical Exam Vitals and nursing  note reviewed. Exam conducted with a chaperone present.  Constitutional:      Appearance: Normal appearance.  Cardiovascular:     Rate and Rhythm: Normal rate and regular rhythm.     Pulses: Normal pulses.     Heart sounds: Normal heart sounds.  Pulmonary:     Effort: Pulmonary effort is normal.     Breath sounds: Normal breath sounds.  Abdominal:     Palpations: Abdomen is soft. There is no hepatomegaly, splenomegaly or mass.     Tenderness: There is no abdominal tenderness.  Musculoskeletal:     Right lower leg: No edema.     Left lower leg: Edema present.  Lymphadenopathy:     Cervical: No cervical adenopathy.     Right cervical: No superficial, deep or posterior cervical adenopathy.    Left cervical: No superficial, deep or posterior cervical adenopathy.     Upper Body:     Right upper body: No supraclavicular or axillary adenopathy.     Left upper body: No supraclavicular or axillary adenopathy.  Neurological:     General: No focal deficit present.     Mental Status: He is alert and oriented to person, place, and time.  Psychiatric:        Mood and Affect: Mood normal.        Behavior: Behavior normal.     LABS:      Latest Ref Rng & Units 03/06/2023    8:28 AM 03/01/2023    2:50 AM 02/25/2023    2:55 PM  CBC  WBC 4.0 - 10.5 K/uL 4.4  2.8  20.0   Hemoglobin 13.0 - 17.0 g/dL 16.1  09.6  04.5   Hematocrit 39.0 - 52.0 % 34.5  33.3  41.3   Platelets 150 - 400 K/uL 29  41  101       Latest Ref Rng & Units 03/06/2023    8:28 AM 03/02/2023    5:46 AM 03/01/2023    5:59 PM  CMP  Glucose 70 - 99 mg/dL 409  85  811   BUN 8 - 23 mg/dL 54  57  58   Creatinine 0.61 - 1.24 mg/dL 9.14  7.82  9.56   Sodium 135 - 145 mmol/L 127  130  130   Potassium 3.5 - 5.1 mmol/L 4.5  4.3  4.6   Chloride 98 - 111 mmol/L 98  100  100   CO2 22 - 32 mmol/L 19  21  19    Calcium 8.9 - 10.3 mg/dL 8.6  7.5  7.4   Total Protein 6.5 - 8.1 g/dL 4.6     Total Bilirubin 0.3 - 1.2 mg/dL 2.7      Alkaline  Phos 38 - 126 U/L 113     AST 15 - 41 U/L 40     ALT 0 - 44 U/L 33        No results found for: "CEA1", "CEA" / No results found for: "CEA1", "CEA" No results found for: "PSA1" No results found for: "CAN199" No results found for: "CAN125"  Lab Results  Component Value Date   TOTALPROTELP 6.5 07/11/2015   TOTALPROTELP 6.4 07/11/2015   ALBUMINELP 3.7 07/11/2015   A1GS 0.3 07/11/2015   A2GS 0.5 07/11/2015   BETS 1.0 07/11/2015   GAMS 1.0 07/11/2015   MSPIKE 0.4 (H) 07/11/2015   SPEI Comment 07/11/2015   Lab Results  Component Value Date   TIBC 321 01/23/2023   TIBC 341 09/16/2022   TIBC 375 04/11/2022   FERRITIN 85 01/23/2023   FERRITIN 41 09/16/2022   FERRITIN 41 04/11/2022   IRONPCTSAT 28 01/23/2023   IRONPCTSAT 28 09/16/2022   IRONPCTSAT 34 04/11/2022   Lab Results  Component Value Date   LDH 220 (H) 02/27/2023   LDH 159 01/23/2023   LDH 142 09/16/2022     STUDIES:   DG Chest 2 View  Result Date: 03/01/2023 CLINICAL DATA:  Shortness of breath EXAM: CHEST - 2 VIEW COMPARISON:  Radiograph 02/28/2023 FINDINGS: No significant change from 02/28/2023. Low lung volumes. Consolidation in the medial right lower lung and posterior left lower lobe. No pleural effusion or pneumothorax. IMPRESSION: Bilateral lower lung airspace consolidation may be due to atelectasis or pneumonia. Electronically Signed   By: Minerva Fester M.D.   On: 03/01/2023 21:13   DG CHEST PORT 1 VIEW  Result Date: 02/28/2023 CLINICAL DATA:  Dyspnea EXAM: PORTABLE CHEST 1 VIEW COMPARISON:  02/28/2023 FINDINGS: Pulmonary hypoinflation. The heart size and mediastinal contours are within normal limits. Both lungs are clear. The visualized skeletal structures are unremarkable. IMPRESSION: 1. No active disease. Electronically Signed   By: Helyn Numbers M.D.   On: 02/28/2023 23:19   DG Chest Port 1 View  Result Date: 02/28/2023 CLINICAL DATA:  Chronic lymphocytic leukemia EXAM: PORTABLE CHEST 1  VIEW COMPARISON:  X-ray 02/12/2023 FINDINGS: Underinflation. Mild right basilar atelectasis. No pneumothorax, effusion or edema. Enlarged cardiopericardial silhouette with vascular congestion. Degenerative changes along the spine. IMPRESSION: Underinflation.  Mild right basilar atelectasis. Enlarged heart with some central vascular congestion. Electronically Signed   By: Karen Kays M.D.   On: 02/28/2023 18:05   US BIOPSY (ABDOMINAL RETROPERTIONEAL MASS)  Result Date: 02/17/2023 INDICATION: 67 year old male with diffuse lymphadenopathy, history of small cell lymphocytic lymphoma. EXAM: Ultrasound-guided lymph node biopsy MEDICATIONS: None. ANESTHESIA/SEDATION: Moderate (conscious) sedation was employed during this procedure. A total of Versed 1 mg and Fentanyl 50 mcg was administered intravenously by the radiology nurse. Total intra-service moderate Sedation Time: 4 minutes. The patient's level of consciousness and vital signs were monitored continuously by radiology nursing throughout the procedure under my direct supervision. COMPLICATIONS: None immediate. PROCEDURE: Informed written consent was obtained from the patient after a thorough discussion of the procedural risks, benefits and alternatives. All questions were addressed. Maximal Sterile Barrier Technique was utilized including caps, mask, sterile gowns, sterile gloves, sterile drape, hand hygiene and skin antiseptic. A timeout was performed prior to the initiation of the procedure. Preprocedure ultrasound evaluation demonstrated multifocal prominent, superficial left iliac lymph nodes. Procedure was planned. Subdermal Local anesthesia was provided 1% lidocaine. Deeper local anesthetic was administered to the periphery of the lymph node under direct ultrasound visualization. A 17 gauge Coaxial introducer  needle was directed to the periphery of the targeted lymph node. A total of 3, 18 gauge core biopsies were then obtained and placed in saline. The  coaxial needle was removed. Hemostasis was achieved with brief manual compression. Postprocedure ultrasound evaluation demonstrated no evidence of surrounding hematoma or other complicating features. The patient tolerated the procedure well was transferred to the recovery area in good condition. IMPRESSION: Technically successful ultrasound-guided left iliac lymph node biopsy. Marliss Coots, MD [facility] Electronically Signed   By: Marliss Coots M.D.   On: 02/17/2023 13:25   ECHOCARDIOGRAM COMPLETE  Result Date: 02/14/2023    ECHOCARDIOGRAM REPORT   Patient Name:   Adam Reyes Date of Exam: 02/14/2023 Medical Rec #:  [id number]     Height:       76.0 in Accession #:    [id number]    Weight:       269.2 lb Date of Birth:  1956/01/06      BSA:          2.513 m Patient Age:    67 years      BP:           97/52 mmHg Patient Gender: M             HR:           85 bpm. Exam Location:  Inpatient Procedure: 2D Echo, Cardiac Doppler, Color Doppler and Intracardiac            Opacification Agent Indications:    Atrial fibrillation  History:        Patient has prior history of Echocardiogram examinations, most                 recent 07/24/2021. CAD and Previous Myocardial Infarction; Risk                 Factors:Hypertension. Lymphoma.  Sonographer:    Milda Smart Referring Phys: [id number] Boyce Medici GONFA  Sonographer Comments: Image acquisition challenging due to patient body habitus and Image acquisition challenging due to respiratory motion. IMPRESSIONS  1. Left ventricular ejection fraction, by estimation, is 70 to 75%. The left ventricle has hyperdynamic function. The left ventricle has no regional wall motion abnormalities. There is mild asymmetric left ventricular hypertrophy of the basal-septal segment. Peak LV outflow tract gradient 94 mmHg. Left ventricular diastolic parameters are consistent with Grade II diastolic dysfunction (pseudonormalization).  2.  Right ventricular systolic function is normal. The right ventricular size is normal. Tricuspid regurgitation signal is inadequate for assessing PA pressure.  3. Left atrial size was mildly dilated.  4. There is mitral valve systolic anterior motion. The mitral valve is abnormal. Trivial mitral valve regurgitation. No evidence of mitral stenosis.  5. The aortic valve is tricuspid. Aortic valve regurgitation is mild to moderate. No aortic stenosis is present.  6. Aortic dilatation noted. There is mild dilatation of the ascending aorta, measuring 40 mm.  7. The inferior vena cava is normal in size with greater than 50% respiratory variability, suggesting right atrial pressure of 3 mmHg.  8. Vigorous LV systolic function with LVOT gradient and SAM. Patient has HOCM physiology. FINDINGS  Left Ventricle: Left ventricular ejection fraction, by estimation, is 70 to 75%. The left ventricle has hyperdynamic function. The left ventricle has no regional wall motion abnormalities. Definity contrast agent was given IV to delineate the left ventricular endocardial borders. The left ventricular internal cavity size was normal in size. There is  mild asymmetric left ventricular hypertrophy of the basal-septal segment. Left ventricular diastolic parameters are consistent with Grade II diastolic  dysfunction (pseudonormalization). Right Ventricle: The right ventricular size is normal. No increase in right ventricular wall thickness. Right ventricular systolic function is normal. Tricuspid regurgitation signal is inadequate for assessing PA pressure. Left Atrium: Left atrial size was mildly dilated. Right Atrium: Right atrial size was normal in size. Pericardium: There is no evidence of pericardial effusion. Mitral Valve: There is mitral valve systolic anterior motion. The mitral valve is abnormal. Trivial mitral valve regurgitation. No evidence of mitral valve stenosis. MV peak gradient, 6.0 mmHg. The mean mitral valve gradient is 3.0  mmHg. Tricuspid Valve: The tricuspid valve is normal in structure. Tricuspid valve regurgitation is trivial. Aortic Valve: The aortic valve is tricuspid. Aortic valve regurgitation is mild to moderate. Aortic regurgitation PHT measures 450 msec. No aortic stenosis is present. Aortic valve mean gradient measures 48.0 mmHg. Aortic valve peak gradient measures 93.7 mmHg. Pulmonic Valve: The pulmonic valve was normal in structure. Pulmonic valve regurgitation is trivial. Aorta: The aortic root is normal in size and structure and aortic dilatation noted. There is mild dilatation of the ascending aorta, measuring 40 mm. Venous: The inferior vena cava is normal in size with greater than 50% respiratory variability, suggesting right atrial pressure of 3 mmHg. IAS/Shunts: No atrial level shunt detected by color flow Doppler.  LEFT VENTRICLE PLAX 2D LVIDd:         5.50 cm      Diastology LVIDs:         3.10 cm      LV e' medial:    6.96 cm/s LV PW:         1.00 cm      LV E/e' medial:  10.6 LV IVS:        0.80 cm      LV e' lateral:   8.16 cm/s LVOT diam:     2.40 cm      LV E/e' lateral: 9.1 LVOT Area:     4.52 cm  LV Volumes (MOD) LV vol d, MOD A2C: 135.0 ml LV vol d, MOD A4C: 130.0 ml LV vol s, MOD A2C: 33.0 ml LV vol s, MOD A4C: 38.4 ml LV SV MOD A2C:     102.0 ml LV SV MOD A4C:     130.0 ml LV SV MOD BP:      97.9 ml RIGHT VENTRICLE RV Basal diam:  2.60 cm RV S prime:     21.10 cm/s TAPSE (M-mode): 2.5 cm LEFT ATRIUM             Index        RIGHT ATRIUM           Index LA diam:        3.60 cm 1.43 cm/m   RA Area:     14.20 cm LA Vol (A2C):   83.1 ml 33.06 ml/m  RA Volume:   31.00 ml  12.33 ml/m LA Vol (A4C):   84.0 ml 33.42 ml/m LA Biplane Vol: 90.8 ml 36.13 ml/m  AORTIC VALVE AV Vmax:      484.00 cm/s AV Vmean:     329.000 cm/s AV VTI:       0.756 m AV Peak Grad: 93.7 mmHg AV Mean Grad: 48.0 mmHg AI PHT:       450 msec  AORTA Ao Root diam: 3.60 cm Ao Asc diam:  4.00 cm MITRAL VALVE MV Peak grad:  6.0 mmHg      SHUNTS MV Mean grad: 3.0 mmHg     Systemic Diam: 2.40 cm MV Vmax:      1.22 m/s MV Vmean:     81.3 cm/s MR Peak grad: 96.0 mmHg MR Vmax:      490.00 cm/s MV E velocity: 73.90 cm/s MV A velocity: 89.90 cm/s MV E/A ratio:  0.82 Dalton McleanMD Electronically signed by Wilfred Lacy Signature Date/Time: 02/14/2023/11:17:54 AM    Final    CT ABDOMEN LIMITED WO CONTRAST  Result Date: 02/13/2023 CLINICAL DATA:  Retroperitoneal biopsy, procedure deferred due to onset of AFib * Tracking Code: BO * EXAM: CT ABDOMEN WITHOUT CONTRAST LIMITED TECHNIQUE: Multidetector CT imaging of the abdomen was performed following the standard protocol without IV contrast. RADIATION DOSE REDUCTION: This exam was performed according to the departmental dose-optimization program which includes automated exposure control, adjustment of the mA and/or kV according to patient size and/or use of iterative reconstruction technique. COMPARISON:  CT abdomen pelvis, 02/12/2023 FINDINGS: Hepatobiliary: No solid liver abnormality is seen. No gallstones, gallbladder wall thickening, or biliary dilatation. Pancreas: Unremarkable. No pancreatic ductal dilatation or surrounding inflammatory changes. Spleen: Gross splenomegaly, maximum span 20.3 cm. Adrenals/Urinary Tract: Adrenal glands are unremarkable. Simple, benign left renal cortical cysts, for which no further follow-up or characterization is required kidneys are otherwise normal, without renal calculi, solid lesion, or hydronephrosis. Stomach/Bowel: Stomach is within normal limits. No evidence of bowel wall thickening, distention, or inflammatory changes. Descending colonic diverticulosis. Vascular/Lymphatic: Aortic atherosclerosis. Unchanged bulky retrocrural, retroperitoneal, and bilateral iliac lymphadenopathy, with numerous additional prominent lymph nodes throughout the bowel mesentery. Other: No abdominal wall hernia or abnormality. No ascites. Musculoskeletal: No acute or significant osseous  findings. IMPRESSION: 1. Unchanged bulky retrocrural, retroperitoneal, and bilateral iliac lymphadenopathy, with numerous additional prominent lymph nodes throughout the bowel mesentery. 2. Gross splenomegaly, maximum span 20.3 cm. 3. Findings are unchanged in comparison to prior day's examination and most consistent with lymphoma. No acute findings. 4. Descending colonic diverticulosis without evidence of acute diverticulitis. Aortic Atherosclerosis (ICD10-I70.0). Electronically Signed   By: Jearld Lesch M.D.   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 Right lower quadrant abdominal pain. 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 * Tracking Code: BO * EXAM: CT ABDOMEN AND PELVIS WITH CONTRAST TECHNIQUE: Multidetector CT imaging of the abdomen and pelvis was performed using the standard protocol following bolus administration of intravenous contrast. RADIATION DOSE REDUCTION: This exam was performed according to the departmental dose-optimization program which includes automated exposure control, adjustment of the mA and/or kV according to patient size and/or use of iterative reconstruction technique. CONTRAST:  OMNIPAQUE IOHEXOL 300 MG/ML  SOLN COMPARISON:  PET-CT 02/06/2023. CT of the chest, abdomen and pelvis 01/24/2023 and 04/11/2022. FINDINGS: Lower chest: Stable linear atelectasis or scarring at both lung bases. Small pulmonary nodules bilaterally are unchanged from recent prior imaging, measuring up to 8 mm in the right lower lobe and 9 mm in the left lower lobe, both on image 9/5. There is progressive subcarinal and distal paraesophageal adenopathy as seen on recent PET-CT. A distal right paraesophageal node measures up to 5.4 x 4.6 cm on image 14/2. Hepatobiliary: The liver is normal in density without suspicious focal abnormality. No evidence of gallstones, gallbladder wall  thickening or biliary dilatation. Pancreas: Unremarkable. No pancreatic ductal dilatation or surrounding inflammatory changes. Spleen: Progressive splenomegaly compared with recent CT.  No focal abnormality identified. Adrenals/Urinary Tract: Both adrenal glands appear normal. No evidence of urinary tract calculus, suspicious renal lesion or hydronephrosis. Unchanged cyst in the mid left kidney for which no follow-up imaging is recommended. Stable bladder compression by the bilateral pelvic lymphadenopathy. No intrinsic bladder abnormality identified. Stomach/Bowel: No enteric contrast administered. The stomach appears unremarkable for its degree of distension. No evidence of bowel wall thickening, distention or surrounding inflammatory change. Vascular/Lymphatic: Again demonstrated is extensive retroperitoneal, pelvic and bilateral inguinal adenopathy. No significant change is seen from the recent PET-CT, although some of the lymph nodes have enlarged from the diagnostic CT done 01/24/2023. For example, there is a left external iliac node measuring 4.5 cm short axis on image 86/2 which previously measured 3.7 cm. A presacral node measuring 3.1 cm on image 72/2 previously measured 2.3 cm. No nodal necrosis identified. Mild aortic and branch vessel atherosclerosis without evidence of aneurysm or large vessel occlusion. Reproductive: The prostate gland appears mildly enlarged, but stable. Other: No evidence of abdominal wall mass or hernia. No ascites or pneumoperitoneum. Musculoskeletal: No acute or significant osseous findings. There is multilevel spondylosis with a grade 1 anterolisthesis at L5-S1 secondary to underlying pars defects. Associated prominent foraminal narrowing in the lower lumbar spine, unchanged. Unless specific follow-up recommendations are mentioned in the findings or impression sections, no imaging follow-up of any mentioned incidental findings is recommended. IMPRESSION: 1. Known extensive  adenopathy in the lower chest, abdomen and pelvis has mildly progressed compared with the CTs of less than 3 weeks earlier consistent with progressive lymphoma. 2. Progressive splenomegaly. 3. Stable small pulmonary nodules at both lung bases. 4. No definite acute findings. 5. 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 Associated prominent foraminal narrowing in the lower lumbar spine. 6.  Aortic Atherosclerosis (ICD10-I70.0). Electronically Signed   By: Carey Bullocks M.D.   On: 02/12/2023 12:16   DG Chest Port 1 View  Result Date: 02/12/2023 CLINICAL DATA:  Sepsis.  History of lymphoma EXAM: PORTABLE CHEST 1 VIEW COMPARISON:  PET-CT 02/06/2023 FINDINGS: Subtle bandlike opacity right lung base. Atelectasis is favored. No consolidation, pneumothorax or effusion. No edema. Normal cardiopericardial silhouette. Overlapping cardiac leads. IMPRESSION: Right basilar atelectasis.  No pneumothorax or effusion. Electronically Signed   By: Karen Kays M.D.   On: 02/12/2023 11:29   NM PET Image Restag (PS) Skull Base To Thigh  Result Date: 02/12/2023 CLINICAL DATA:  Subsequent treatment strategy for small lymphocytic lymphoma. EXAM: NUCLEAR MEDICINE PET SKULL BASE TO THIGH TECHNIQUE: 13.3 mCi F-18 FDG was injected intravenously. Full-ring PET imaging was performed from the skull base to thigh after the radiotracer. CT data was obtained and used for attenuation correction and anatomic localization. Fasting blood glucose: 105 mg/dl COMPARISON:  CT 1/61/09 FINDINGS: Mediastinal blood pool activity: SUV max 2.51 Liver activity: SUV max 3.29 NECK: Bilateral cervical adenopathy is identified with corresponding mild increased uptake. -right level 2 node measures 1.6 cm with SUV max of 2.75. -Left level 3 lymph node measures 1.3 cm with SUV max of 2.69, image 59/202. Incidental CT findings: None. CHEST: Left superior mediastinal prevascular lymph node measures 1.7 cm  with SUV max of 3.93, image 86/202. Multiple tracer avid posterior mediastinal lymph nodes are identified. -aorto esophageal lymph node measures 1.8 cm with SUV max of 3.99. -Right posterior mediastinal node just above the GE junction measures 3.2 cm with SUV max of 4.71, image 148/202. -Left lower posterior mediastinal node measures 3.4 cm with  SUV max 4.59, image 148/202. Bilateral retropectoral adenopathy is identified which exhibits mild tracer uptake. -Right retropectoral lymph node measures 1.5 cm with SUV max of 1.82, image 90/202. -Left retropectoral lymph node measures 1.4 cm with SUV max of 1.98, image 94/202. Small bilateral lung nodules are again noted. -Index nodule within the anterior left upper lobe measures 7 mm with SUV max of 0.89, image 88/202. -1 cm nodule within the basilar right upper lobe has an SUV max of 0.71, image 98/202. Incidental CT findings: Aortic atherosclerosis and coronary artery calcifications. ABDOMEN/PELVIS: There is extensive tracer avid abdominopelvic adenopathy. -left retrocrural lymph node measures 2.4 cm with SUV max of 5.19, image 165/202. -Left retroperitoneal nodal conglomeration measures 9.2 x 6.5 cm with SUV max of 10.76, image 200/202. -Right common iliac lymph node measures 1.9 cm with SUV max of 4.65, image 238/202 -Left inguinal lymph node measures 1.7 cm with SUV max of 3.07, image 286/202. -Large nodal conglomeration within the left external iliac chain measures 11.3 x 6.9 cm with an SUV max 8.89, image 260/202. Diffuse increased uptake within the spleen has an SUV max 3.67. The spleen is enlarged measuring 18.09 cm in cranial caudal dimension. No abnormal tracer uptake identified within the liver, pancreas, or adrenal glands. Incidental CT findings: Left kidney cyst measures 6.3 cm, image 190/202. Aortic atherosclerotic calcifications. SKELETON: No focal hypermetabolic activity to suggest skeletal metastasis. Incidental CT findings: None. IMPRESSION: 1.  Extensive tracer avid adenopathy within the chest, abdomen, and pelvis compatible with lymphoma. Deauville criteria 4/5. 2. Splenomegaly with diffuse increased uptake concerning for splenic involvement. Deauville criteria 4 3. Bilateral lung nodules are not significantly tracer avid. However these lung nodules are too small to reliably characterize by PET-CT. 4. Aortic Atherosclerosis (ICD10-I70.0). Electronically Signed   By: Signa Kell M.D.   On: 02/12/2023 09:46

## 2023-03-06 ENCOUNTER — Ambulatory Visit (HOSPITAL_COMMUNITY): Payer: PPO

## 2023-03-06 ENCOUNTER — Inpatient Hospital Stay: Payer: PPO

## 2023-03-06 ENCOUNTER — Other Ambulatory Visit: Payer: Self-pay | Admitting: *Deleted

## 2023-03-06 ENCOUNTER — Inpatient Hospital Stay: Payer: PPO | Admitting: Hematology

## 2023-03-06 VITALS — BP 105/62 | HR 101 | Temp 97.5°F | Resp 20

## 2023-03-06 DIAGNOSIS — C8300 Small cell B-cell lymphoma, unspecified site: Secondary | ICD-10-CM

## 2023-03-06 DIAGNOSIS — C9110 Chronic lymphocytic leukemia of B-cell type not having achieved remission: Secondary | ICD-10-CM

## 2023-03-06 DIAGNOSIS — Z7901 Long term (current) use of anticoagulants: Secondary | ICD-10-CM | POA: Diagnosis not present

## 2023-03-06 DIAGNOSIS — D649 Anemia, unspecified: Secondary | ICD-10-CM | POA: Diagnosis not present

## 2023-03-06 DIAGNOSIS — I4891 Unspecified atrial fibrillation: Secondary | ICD-10-CM | POA: Diagnosis not present

## 2023-03-06 DIAGNOSIS — I119 Hypertensive heart disease without heart failure: Secondary | ICD-10-CM | POA: Diagnosis not present

## 2023-03-06 DIAGNOSIS — Z5112 Encounter for antineoplastic immunotherapy: Secondary | ICD-10-CM | POA: Diagnosis not present

## 2023-03-06 DIAGNOSIS — R590 Localized enlarged lymph nodes: Secondary | ICD-10-CM

## 2023-03-06 DIAGNOSIS — D509 Iron deficiency anemia, unspecified: Secondary | ICD-10-CM

## 2023-03-06 LAB — CBC WITH DIFFERENTIAL/PLATELET
Abs Immature Granulocytes: 0.1 10*3/uL — ABNORMAL HIGH (ref 0.00–0.07)
Band Neutrophils: 7 %
Basophils Absolute: 0 10*3/uL (ref 0.0–0.1)
Basophils Relative: 0 %
Eosinophils Absolute: 0.1 10*3/uL (ref 0.0–0.5)
Eosinophils Relative: 2 %
HCT: 34.5 % — ABNORMAL LOW (ref 39.0–52.0)
Hemoglobin: 11.8 g/dL — ABNORMAL LOW (ref 13.0–17.0)
Lymphocytes Relative: 35 %
Lymphs Abs: 1.5 10*3/uL (ref 0.7–4.0)
MCH: 33.8 pg (ref 26.0–34.0)
MCHC: 34.2 g/dL (ref 30.0–36.0)
MCV: 98.9 fL (ref 80.0–100.0)
Metamyelocytes Relative: 3 %
Monocytes Absolute: 0.2 10*3/uL (ref 0.1–1.0)
Monocytes Relative: 5 %
Neutro Abs: 2.4 10*3/uL (ref 1.7–7.7)
Neutrophils Relative %: 48 %
Platelets: 29 10*3/uL — CL (ref 150–400)
RBC: 3.49 MIL/uL — ABNORMAL LOW (ref 4.22–5.81)
RDW: 18.7 % — ABNORMAL HIGH (ref 11.5–15.5)
WBC: 4.4 10*3/uL (ref 4.0–10.5)
nRBC: 2.3 % — ABNORMAL HIGH (ref 0.0–0.2)
nRBC: 3 /100 WBC — ABNORMAL HIGH

## 2023-03-06 LAB — COMPREHENSIVE METABOLIC PANEL
ALT: 33 U/L (ref 0–44)
AST: 40 U/L (ref 15–41)
Albumin: 2.5 g/dL — ABNORMAL LOW (ref 3.5–5.0)
Alkaline Phosphatase: 113 U/L (ref 38–126)
Anion gap: 10 (ref 5–15)
BUN: 54 mg/dL — ABNORMAL HIGH (ref 8–23)
CO2: 19 mmol/L — ABNORMAL LOW (ref 22–32)
Calcium: 8.6 mg/dL — ABNORMAL LOW (ref 8.9–10.3)
Chloride: 98 mmol/L (ref 98–111)
Creatinine, Ser: 1.18 mg/dL (ref 0.61–1.24)
GFR, Estimated: >60 mL/min (ref >60)
Glucose, Bld: 145 mg/dL — ABNORMAL HIGH (ref 70–99)
Potassium: 4.5 mmol/L (ref 3.5–5.1)
Sodium: 127 mmol/L — ABNORMAL LOW (ref 135–145)
Total Bilirubin: 2.7 mg/dL — ABNORMAL HIGH (ref 0.3–1.2)
Total Protein: 4.6 g/dL — ABNORMAL LOW (ref 6.5–8.1)

## 2023-03-06 LAB — URIC ACID: Uric Acid, Serum: 6.1 mg/dL (ref 3.7–8.6)

## 2023-03-06 LAB — MAGNESIUM: Magnesium: 2.6 mg/dL — ABNORMAL HIGH (ref 1.7–2.4)

## 2023-03-06 MED FILL — Dexamethasone Sodium Phosphate Inj 100 MG/10ML: INTRAMUSCULAR | Qty: 2 | Status: AC

## 2023-03-06 NOTE — Patient Instructions (Addendum)
[facility]  Discharge Instructions  You were seen and examined today by Dr. Ellin Saba.  Your labs are stable and improving, except for your platelets. We will hold treatment today due to platelets but they are not low enough to need a transfusion.  Follow-up as scheduled.  Thank you for choosing [facility] to provide your oncology and hematology care.   To afford each patient quality time with our provider, please arrive at least 15 minutes before your scheduled appointment time. You may need to reschedule your appointment if you arrive late (10 or more minutes). Arriving late affects you and other patients whose appointments are after yours.  Also, if you miss three or more appointments without notifying the office, you may be dismissed from the clinic at the provider's discretion.    Again, thank you for choosing [facility].  Our hope is that these requests will decrease the amount of time that you wait before being seen by our physicians.   If you have a lab appointment with the Cancer Center - please note that after April 8th, all labs will be drawn in the cancer center.  You do not have to check in or register with the main entrance as you have in the past but will complete your check-in at the cancer center.            _____________________________________________________________  Should you have questions after your visit to [facility], please contact our office at [phone] and follow the prompts.  Our office hours are 8:00 a.m. to 4:30 p.m. Monday - Thursday and 8:00 a.m. to 2:30 p.m. Friday.  Please note that voicemails left after 4:00 p.m. may not be returned until the following business day.  We are closed weekends and all major holidays.  You do have access to a nurse 24-7, just call the main number to the clinic [phone] and do not press any options, hold on the line and a nurse will answer  the phone.    For prescription refill requests, have your pharmacy contact our office and allow 72 hours.    Masks are no longer required in the cancer centers. If you would like for your care team to wear a mask while they are taking care of you, please let them know. You may have one support person who is at least 65 years old accompany you for your appointments.

## 2023-03-06 NOTE — Progress Notes (Signed)
Patient has been assessed, vital signs and labs have been reviewed by Dr. Ellin Saba. ANC, Creatinine, LFTs, and Platelets are within treatment parameters per Dr. Ellin Saba. The patient is to hold treatment at this time. Primary RN and pharmacy aware.

## 2023-03-06 NOTE — Progress Notes (Signed)
CRITICAL VALUE ALERT Critical value received:  platelets 29K Date of notification:  03-06-2023. Time of notification: 09:30 am.  Critical value read back:  Yes.   Nurse who received alert:  B.Kailan Laws RN.  MD notified time and response:  Dr. Ellin Saba @ 09:34 am. No new orders received.

## 2023-03-06 NOTE — Progress Notes (Signed)
No treatment today and no need for platelets per Dr. Ellin Saba

## 2023-03-10 ENCOUNTER — Emergency Department (HOSPITAL_COMMUNITY): Payer: PPO

## 2023-03-10 ENCOUNTER — Other Ambulatory Visit: Payer: Self-pay

## 2023-03-10 ENCOUNTER — Emergency Department (HOSPITAL_COMMUNITY)
Admission: EM | Admit: 2023-03-10 | Discharge: 2023-03-16 | Disposition: E | Payer: PPO | Attending: Emergency Medicine | Admitting: Emergency Medicine

## 2023-03-10 ENCOUNTER — Ambulatory Visit: Payer: PPO | Admitting: General Practice

## 2023-03-10 ENCOUNTER — Encounter (HOSPITAL_COMMUNITY): Payer: Self-pay | Admitting: Emergency Medicine

## 2023-03-10 ENCOUNTER — Telehealth: Payer: Self-pay | Admitting: Internal Medicine

## 2023-03-10 DIAGNOSIS — R0602 Shortness of breath: Secondary | ICD-10-CM | POA: Insufficient documentation

## 2023-03-10 DIAGNOSIS — Z7901 Long term (current) use of anticoagulants: Secondary | ICD-10-CM | POA: Insufficient documentation

## 2023-03-10 DIAGNOSIS — I252 Old myocardial infarction: Secondary | ICD-10-CM | POA: Insufficient documentation

## 2023-03-10 DIAGNOSIS — R0989 Other specified symptoms and signs involving the circulatory and respiratory systems: Secondary | ICD-10-CM | POA: Diagnosis not present

## 2023-03-10 DIAGNOSIS — C9110 Chronic lymphocytic leukemia of B-cell type not having achieved remission: Secondary | ICD-10-CM | POA: Insufficient documentation

## 2023-03-10 DIAGNOSIS — Z87891 Personal history of nicotine dependence: Secondary | ICD-10-CM | POA: Diagnosis not present

## 2023-03-10 DIAGNOSIS — J9601 Acute respiratory failure with hypoxia: Secondary | ICD-10-CM | POA: Diagnosis not present

## 2023-03-10 DIAGNOSIS — D649 Anemia, unspecified: Secondary | ICD-10-CM | POA: Insufficient documentation

## 2023-03-10 DIAGNOSIS — I10 Essential (primary) hypertension: Secondary | ICD-10-CM | POA: Diagnosis not present

## 2023-03-10 DIAGNOSIS — R1110 Vomiting, unspecified: Secondary | ICD-10-CM | POA: Diagnosis not present

## 2023-03-10 DIAGNOSIS — K922 Gastrointestinal hemorrhage, unspecified: Secondary | ICD-10-CM | POA: Insufficient documentation

## 2023-03-10 DIAGNOSIS — Z856 Personal history of leukemia: Secondary | ICD-10-CM | POA: Insufficient documentation

## 2023-03-10 DIAGNOSIS — I2510 Atherosclerotic heart disease of native coronary artery without angina pectoris: Secondary | ICD-10-CM | POA: Diagnosis not present

## 2023-03-10 DIAGNOSIS — J9811 Atelectasis: Secondary | ICD-10-CM | POA: Diagnosis not present

## 2023-03-10 DIAGNOSIS — K920 Hematemesis: Secondary | ICD-10-CM | POA: Diagnosis not present

## 2023-03-10 DIAGNOSIS — N400 Enlarged prostate without lower urinary tract symptoms: Secondary | ICD-10-CM | POA: Diagnosis not present

## 2023-03-10 DIAGNOSIS — R1111 Vomiting without nausea: Secondary | ICD-10-CM | POA: Diagnosis not present

## 2023-03-10 DIAGNOSIS — R55 Syncope and collapse: Secondary | ICD-10-CM | POA: Diagnosis not present

## 2023-03-10 DIAGNOSIS — R591 Generalized enlarged lymph nodes: Secondary | ICD-10-CM | POA: Diagnosis not present

## 2023-03-10 DIAGNOSIS — R918 Other nonspecific abnormal finding of lung field: Secondary | ICD-10-CM | POA: Diagnosis not present

## 2023-03-10 DIAGNOSIS — R161 Splenomegaly, not elsewhere classified: Secondary | ICD-10-CM | POA: Diagnosis not present

## 2023-03-10 DIAGNOSIS — R58 Hemorrhage, not elsewhere classified: Secondary | ICD-10-CM | POA: Diagnosis not present

## 2023-03-10 DIAGNOSIS — I469 Cardiac arrest, cause unspecified: Secondary | ICD-10-CM | POA: Diagnosis not present

## 2023-03-10 DIAGNOSIS — K625 Hemorrhage of anus and rectum: Secondary | ICD-10-CM | POA: Diagnosis not present

## 2023-03-10 LAB — CBC WITH DIFFERENTIAL/PLATELET
Abs Immature Granulocytes: 0.2 10*3/uL — ABNORMAL HIGH (ref 0.00–0.07)
Basophils Absolute: 0 10*3/uL (ref 0.0–0.1)
Basophils Relative: 0 %
Eosinophils Absolute: 0.1 10*3/uL (ref 0.0–0.5)
Eosinophils Relative: 1 %
HCT: 19.8 % — ABNORMAL LOW (ref 39.0–52.0)
Hemoglobin: 6.4 g/dL — CL (ref 13.0–17.0)
Lymphocytes Relative: 47 %
Lymphs Abs: 3.9 10*3/uL (ref 0.7–4.0)
MCH: 34.2 pg — ABNORMAL HIGH (ref 26.0–34.0)
MCHC: 32.3 g/dL (ref 30.0–36.0)
MCV: 105.9 fL — ABNORMAL HIGH (ref 80.0–100.0)
Metamyelocytes Relative: 2 %
Monocytes Absolute: 0.1 10*3/uL (ref 0.1–1.0)
Monocytes Relative: 1 %
Neutro Abs: 4 10*3/uL (ref 1.7–7.7)
Neutrophils Relative %: 49 %
Platelets: 19 10*3/uL — CL (ref 150–400)
RBC: 1.87 MIL/uL — ABNORMAL LOW (ref 4.22–5.81)
RDW: 18.5 % — ABNORMAL HIGH (ref 11.5–15.5)
WBC: 8.2 10*3/uL (ref 4.0–10.5)
nRBC: 1 % — ABNORMAL HIGH (ref 0.0–0.2)
nRBC: 3 /100 WBC — ABNORMAL HIGH

## 2023-03-10 LAB — PREPARE RBC (CROSSMATCH)

## 2023-03-10 LAB — ABO/RH: ABO/RH(D): O POS

## 2023-03-10 LAB — PROTIME-INR
INR: 1.6 — ABNORMAL HIGH (ref 0.8–1.2)
Prothrombin Time: 19.5 seconds — ABNORMAL HIGH (ref 11.4–15.2)

## 2023-03-10 LAB — COMPREHENSIVE METABOLIC PANEL
ALT: 45 U/L — ABNORMAL HIGH (ref 0–44)
AST: 59 U/L — ABNORMAL HIGH (ref 15–41)
Albumin: 2 g/dL — ABNORMAL LOW (ref 3.5–5.0)
Alkaline Phosphatase: 107 U/L (ref 38–126)
Anion gap: 10 (ref 5–15)
BUN: 44 mg/dL — ABNORMAL HIGH (ref 8–23)
CO2: 18 mmol/L — ABNORMAL LOW (ref 22–32)
Calcium: 8.2 mg/dL — ABNORMAL LOW (ref 8.9–10.3)
Chloride: 103 mmol/L (ref 98–111)
Creatinine, Ser: 1.04 mg/dL (ref 0.61–1.24)
GFR, Estimated: >60 mL/min (ref >60)
Glucose, Bld: 109 mg/dL — ABNORMAL HIGH (ref 70–99)
Potassium: 4.8 mmol/L (ref 3.5–5.1)
Sodium: 131 mmol/L — ABNORMAL LOW (ref 135–145)
Total Bilirubin: 2.7 mg/dL — ABNORMAL HIGH (ref 0.3–1.2)
Total Protein: 3.5 g/dL — ABNORMAL LOW (ref 6.5–8.1)

## 2023-03-10 LAB — TROPONIN I (HIGH SENSITIVITY)
Troponin I (High Sensitivity): 32 ng/L — ABNORMAL HIGH (ref <18)
Troponin I (High Sensitivity): 34 ng/L — ABNORMAL HIGH (ref <18)

## 2023-03-10 LAB — BRAIN NATRIURETIC PEPTIDE: B Natriuretic Peptide: 50 pg/mL (ref 0.0–100.0)

## 2023-03-10 LAB — LACTIC ACID, PLASMA
Lactic Acid, Venous: 5.9 mmol/L (ref 0.5–1.9)
Lactic Acid, Venous: 7.1 mmol/L (ref 0.5–1.9)

## 2023-03-10 LAB — TRANSFUSION REACTION

## 2023-03-10 LAB — POC OCCULT BLOOD, ED: Fecal Occult Bld: POSITIVE — AB

## 2023-03-10 LAB — LIPASE, BLOOD: Lipase: 33 U/L (ref 11–51)

## 2023-03-10 MED ORDER — IOHEXOL 350 MG/ML SOLN
100.0000 mL | Freq: Once | INTRAVENOUS | Status: AC | PRN
  Administered 2023-03-10: 100 mL via INTRAVENOUS

## 2023-03-10 MED ORDER — ONDANSETRON HCL 4 MG/2ML IJ SOLN
4.0000 mg | Freq: Once | INTRAMUSCULAR | Status: AC
  Administered 2023-03-10: 4 mg via INTRAVENOUS
  Filled 2023-03-10: qty 2

## 2023-03-10 MED ORDER — AMIODARONE HCL 150 MG/3ML IV SOLN
INTRAVENOUS | Status: AC | PRN
Start: 2023-03-10 — End: 2023-03-10
  Administered 2023-03-10: 300 mg via INTRAVENOUS

## 2023-03-10 MED ORDER — PANTOPRAZOLE INFUSION (NEW) - SIMPLE MED
8.0000 mg/h | INTRAVENOUS | Status: DC
  Administered 2023-03-10: 8 mg/h via INTRAVENOUS
  Filled 2023-03-10: qty 100

## 2023-03-10 MED ORDER — SODIUM CHLORIDE 0.9 % IV BOLUS
1000.0000 mL | Freq: Once | INTRAVENOUS | Status: AC
  Administered 2023-03-10: 1000 mL via INTRAVENOUS

## 2023-03-10 MED ORDER — PROTHROMBIN COMPLEX CONC HUMAN 500 UNITS IV KIT
5320.0000 [IU] | PACK | Status: DC
  Filled 2023-03-10: qty 5320

## 2023-03-10 MED ORDER — EPINEPHRINE 1 MG/10ML IJ SOSY
PREFILLED_SYRINGE | INTRAMUSCULAR | Status: AC | PRN
  Administered 2023-03-10 (×3): 1 mg via INTRAVENOUS

## 2023-03-10 MED ORDER — OCTREOTIDE LOAD VIA INFUSION
100.0000 ug | Freq: Once | INTRAVENOUS | Status: AC
  Administered 2023-03-10: 100 ug via INTRAVENOUS
  Filled 2023-03-10: qty 50

## 2023-03-10 MED ORDER — SODIUM CHLORIDE 0.9 % IV SOLN: 250.0000 mL | INTRAVENOUS | Status: DC

## 2023-03-10 MED ORDER — SODIUM BICARBONATE 8.4 % IV SOLN
INTRAVENOUS | Status: AC | PRN
  Administered 2023-03-10 (×2): 50 meq via INTRAVENOUS

## 2023-03-10 MED ORDER — NOREPINEPHRINE 4 MG/250ML-% IV SOLN: 2.0000 ug/min | INTRAVENOUS | Status: DC

## 2023-03-10 MED ORDER — PANTOPRAZOLE SODIUM 40 MG IV SOLR: 40.0000 mg | Freq: Two times a day (BID) | INTRAVENOUS | Status: DC

## 2023-03-10 MED ORDER — SODIUM CHLORIDE 0.9% IV SOLUTION: Freq: Once | INTRAVENOUS | Status: AC

## 2023-03-10 MED ORDER — SODIUM CHLORIDE 0.9 % IV SOLN
50.0000 ug/h | INTRAVENOUS | Status: DC
  Administered 2023-03-10: 50 ug/h via INTRAVENOUS
  Filled 2023-03-10 (×3): qty 1

## 2023-03-10 MED ORDER — EPINEPHRINE 1 MG/10ML IJ SOSY
PREFILLED_SYRINGE | INTRAMUSCULAR | Status: AC | PRN
  Administered 2023-03-10 (×5): 1 mg via INTRAVENOUS

## 2023-03-10 MED ORDER — SODIUM CHLORIDE 0.9 % IV SOLN
2.0000 g | Freq: Once | INTRAVENOUS | Status: AC
  Administered 2023-03-10: 2 g via INTRAVENOUS
  Filled 2023-03-10: qty 20

## 2023-03-10 MED ORDER — PANTOPRAZOLE 80MG IVPB - SIMPLE MED
80.0000 mg | Freq: Once | INTRAVENOUS | Status: AC
  Administered 2023-03-10: 80 mg via INTRAVENOUS
  Filled 2023-03-10: qty 100

## 2023-03-11 ENCOUNTER — Inpatient Hospital Stay: Payer: PPO

## 2023-03-11 ENCOUNTER — Inpatient Hospital Stay: Payer: PPO | Admitting: Hematology

## 2023-03-11 LAB — TYPE AND SCREEN
ABO/RH(D): O POS
Antibody Screen: NEGATIVE
Unit division: 0
Unit division: 0

## 2023-03-11 LAB — BPAM RBC
Blood Product Expiration Date: 202409262359
Blood Product Expiration Date: 202409262359
ISSUE DATE / TIME: 202408261043
Unit Type and Rh: 5100
Unit Type and Rh: 5100

## 2023-03-11 LAB — PREPARE PLATELET PHERESIS
Unit division: 0
Unit division: 0

## 2023-03-11 LAB — MISC LABCORP TEST (SEND OUT): Labcorp test code: 489590

## 2023-03-11 LAB — BPAM PLATELET PHERESIS
Blood Product Expiration Date: 202408272359
Blood Product Expiration Date: 202408272359
ISSUE DATE / TIME: 202408261311
Unit Type and Rh: 5100
Unit Type and Rh: 6200

## 2023-03-13 ENCOUNTER — Inpatient Hospital Stay: Payer: PPO

## 2023-03-16 NOTE — Telephone Encounter (Signed)
Dr Wallace Cullens called from [facility] ER patient passed away this morning in the ER and asking if Dr Allena Katz could to the death certification. Any questions contact # [phone].

## 2023-03-16 NOTE — ED Notes (Signed)
Chaplin present at bedside around 1420 with family. Family at bedside

## 2023-03-16 NOTE — Code Documentation (Signed)
Patient time of death occurred at 63.

## 2023-03-16 NOTE — ED Notes (Signed)
Po occult blood positive

## 2023-03-16 NOTE — ED Triage Notes (Signed)
Pt arrived via [facility] c/o rectal bleeding since this morning, as well as vomiting blood since this morning. Pt appears pale. Per EMS, pt had a LOC when they sat him up, H xof cancer, does not take his medications like he should per EMS. Received a 450 mL bolus en route

## 2023-03-16 NOTE — ED Notes (Signed)
Was unable to obtain a second line for IV access, will ask another RN to obtain IV access

## 2023-03-16 NOTE — Progress Notes (Signed)
   02/21/2023 1425  Spiritual Encounters  Type of Visit Initial  Care provided to: Pt and family  Conversation partners present during encounter Nurse;Physician  Referral source Code page  Reason for visit Patient death  OnCall Visit No  Spiritual Framework  Presenting Themes Meaning/purpose/sources of inspiration;Courage hope and growth;Significant life change  Community/Connection Family;Friend(s);Faith community  Patient Stress Factors None identified  Family Stress Factors Loss  Interventions  Spiritual Care Interventions Made Established relationship of care and support;Compassionate presence;Normalization of emotions;Bereavement/grief support;Prayer  Intervention Outcomes  Outcomes Connection to spiritual care;Awareness of support  Spiritual Care Plan  Spiritual Care Issues Still Outstanding No further spiritual care needs at this time (see row info)   Code Blue Page response and found patient in bed 2 with clinical staff present performing multiple rounds of CPR. Found patient spouse, Thurston Hole, in family support room and provided support to her and family friend. Multiple family members arrived shortly after patient passed. Chaplain provided support bedside as patient spouse was present during last round of CPR. Provided prayer bedside for her and family present. Engaged in life review and provided bereavement support. No further spiritual needs at this time.   Rev. Jolyn Lent, M.Div Chaplain

## 2023-03-16 NOTE — ED Notes (Signed)
Time of death pronounced by Tanda Rockers, DO, at [phone]

## 2023-03-16 NOTE — ED Notes (Signed)
DO made aware of pts low BP, last reading was 102/48 (62)

## 2023-03-16 NOTE — ED Notes (Signed)
Pt is diaphoretic, and c/o sob towards the end of blood transfusion. Increased RR. Transfusion stopped and DO made aware

## 2023-03-16 NOTE — ED Notes (Signed)
MD made aware of pt c/o of hot and sob

## 2023-03-16 NOTE — ED Notes (Signed)
Second RN at bedside attempting a second site for IV access

## 2023-03-16 NOTE — ED Notes (Signed)
Patient put on 2L oxygen via nasal cannula.

## 2023-03-16 NOTE — Consult Note (Signed)
Gastroenterology Consult   Referring Provider: [facility] Primary Care Physician:  Anabel Halon, MD Primary Gastroenterologist:  Dr. Levon Hedger   Patient ID: Norberta Keens; [id number]; 1955-11-04   Admit date: 03/12/2023  LOS: 0 days   Date of Consultation: 02/25/2023  Reason for Consultation:  Acute GI bleed with acute blood loss anemia  History of Present Illness   PAULL SCHRIMSHER is a very pleasant 67 y.o. year old male with a history of CLL, CAD, HTN, STEMI in 2019, on Eliiquis BID with last dose yesterday evening, IDA in 2022 s/p colonoscopy/EGD/capsule/enteroscopy as noted below and was secondary to bleeding duodenal nodule requiring clip placement and injection,  presenting to the ED via EMS due to acute onset of rectal bleeding.   In the ED: Hgb 6.4, previously 11.8 several days ago. Platelets 19, previously 29. BUN 44, creatinine 1.04, AST 59, ALT 45, Tbili 2.7, sodium 131. INR 1.6. Lactic acid 5.9. CTA stat has been ordered. KCENTRA also ordered but pharmacy is preparing. Octreotide infusion started in ED. PPI infusion on board. 2 units of platelets have been requested but not started. 1 of 2 PRBCs infusing currently.   This morning around 0600, patient had small amount of rectal bleeding when attempting to get up to go to the bathroom. He passed out twice per his wife. EMS was called. While in ED, he has had persistent rectal bleeding. Per ED notes, patient vomited coffee-colored emesis. No NSAIDs. Wife at bedside, Thurston Hole. Patient acutely ill. Receiving blood transfusion. Hypotensive. Patient feels short of breath and fatigued. Wife notes that he has not felt well for the past few weeks with decreased energy. Has had diarrhea daily since infusion 8/22. No known history of liver disease. Reports lower extremity edema for past few weeks. Oncology aware.      EGD march 2022: normal esophagus, fe gastric polyps, gastric erosions without stigmata of bleeding. Negative celiac sprue,  negative H.pylori Colonoscopy March 2022: normal TI, multiple polyps, diverticulosis in sigmoid, descending, and transverse colon. Tubular adenomas and inflammatory polyps. 3 year surveillance Capsule April 2022: inflammatory polyp in proximal small bowel  Enteroscopy May 2022: normal esophagus and stomach, single bleeding duodenal nodule s/p biopsy, clip placement, and injection   Past Medical History:  Diagnosis Date   Chronic lymphocytic leukemia (CLL), B-cell (HCC)    Small Cell Lymphoma   Coronary artery disease, non-occlusive 02/2017   Cardiac cath in setting of MI: 50 and 55% bifurcation LAD-Diag1   Enlarged lymph node    left neck   Hypertension    STEMI (ST elevation myocardial infarction) (HCC) 03/05/2017   hx/notes 03/05/2017 -likely aborted anterior STEMI with 50% bifurcation LAD-Diag1.  No PCI.  Preserved EF   Syncope due to orthostatic hypotension 04/21/2018    Past Surgical History:  Procedure Laterality Date   BIOPSY  10/10/2020   Procedure: BIOPSY;  Surgeon: Dolores Frame, MD;  [facility];  Service: Gastroenterology;;   BIOPSY  11/14/2020   Procedure: BIOPSY;  Surgeon: Dolores Frame, MD;  [facility]: [facility];  Service: Gastroenterology;;   COLONOSCOPY N/A 10/11/2015   Procedure: COLONOSCOPY;  Surgeon: Malissa Hippo, MD;  [facility]: [facility];  Service: Endoscopy;  Laterality: N/A;  10/11/2015   COLONOSCOPY WITH PROPOFOL N/A 10/10/2020   Procedure: COLONOSCOPY WITH PROPOFOL;  Surgeon: Dolores Frame, MD;  [facility]: [facility];  Service: Gastroenterology;  Laterality: N/A;  AM   ENTEROSCOPY N/A 11/14/2020   Procedure: push enteroscopy;  Surgeon: Marguerita Merles, Reuel Boom, MD;  Location: [facility];  Service: Gastroenterology;  Laterality: N/A;   ESOPHAGOGASTRODUODENOSCOPY (EGD) WITH PROPOFOL N/A 10/10/2020   Procedure: ESOPHAGOGASTRODUODENOSCOPY (EGD) WITH PROPOFOL;  Surgeon: Dolores Frame, MD;   [facility];  Service: Gastroenterology;  Laterality: N/A;   ESOPHAGOGASTRODUODENOSCOPY (EGD) WITH PROPOFOL N/A 11/14/2020   Procedure: ESOPHAGOGASTRODUODENOSCOPY (EGD) WITH PROPOFOL;  Surgeon: Dolores Frame, MD;  [facility];  Service: Gastroenterology;  Laterality: N/A;  12:00   GIVENS CAPSULE STUDY N/A 11/07/2020   Procedure: GIVENS CAPSULE STUDY;  Surgeon: Dolores Frame, MD;  [facility]: [facility];  Service: Gastroenterology;  Laterality: N/A;  7:30 am   Graded Exercise Tolerance Test (GXT/ETT)  05/2017   10.7 METs (9: 25 min.  Reached 103% max predicted heart rate).  No EKG findings to suggest coronary ischemia.  Negative, low risk GXT   HEMOSTASIS CLIP PLACEMENT  11/14/2020   Procedure: HEMOSTASIS CLIP PLACEMENT;  Surgeon: Dolores Frame, MD;  [facility];  Service: Gastroenterology;;  small bowel nodule   LEFT HEART CATH AND CORONARY ANGIOGRAPHY N/A 03/05/2017   Procedure: LEFT HEART CATH AND CORONARY ANGIOGRAPHY;  Surgeon: Marykay Lex, MD;  Location: [facility];  Service: Cardiovascular: pLAD-Diag1 50-55% (non-flow-limiiting).  EF ~50-55%.  although bifurcation lesion was presumed Culprit - no PCI (not flow limiting). - Med Rx.   MASS BIOPSY Left 06/27/2015   Procedure: OPEN LEFT NECK BIOPSY ;  Surgeon: Newman Pies, MD;  Location: [facility];  Service: ENT;  Laterality: Left;   POLYPECTOMY  10/10/2020   Procedure: POLYPECTOMY;  Surgeon: Dolores Frame, MD;  [facility]: [facility];  Service: Gastroenterology;;   REFRACTIVE SURGERY Bilateral     Prior to Admission medications   Medication Sig Start Date End Date Taking? Authorizing Provider  allopurinol (ZYLOPRIM) 300 MG tablet Take 1 tablet (300 mg total) by mouth 2 (two) times daily. 03/02/23  Yes Vassie Loll, MD  amiodarone (PACERONE) 200 MG tablet Take 2 tablets (400 mg total) by mouth 2 (two) times daily for 7 days, THEN 1 tablet  (200 mg total) daily. 02/18/23 05/19/23 Yes Almon Hercules, MD  apixaban (ELIQUIS) 5 MG TABS tablet Take 1 tablet (5 mg total) by mouth 2 (two) times daily. 02/18/23  Yes Almon Hercules, MD  atorvastatin (LIPITOR) 40 MG tablet TAKE ONE (1) TABLET BY MOUTH EVERY DAY 02/14/23  Yes Marykay Lex, MD  cholecalciferol (VITAMIN D3) 25 MCG (1000 UNIT) tablet Take 5,000 Units by mouth every other day. 10/09/20  Yes [provider]  ferrous sulfate 324 (65 Fe) MG TBEC Take 324 mg by mouth every other day.   Yes [provider]  loperamide (IMODIUM) 2 MG capsule Take 1 capsule (2 mg total) by mouth as needed for diarrhea or loose stools (2 caps after first loose stool and 1 after each loose stool therafter, not to exceed 8 in a 24 hour period of time.). 03/03/23  Yes Doreatha Massed, MD  oxyCODONE (OXY IR/ROXICODONE) 5 MG immediate release tablet Take 1 tablet (5 mg total) by mouth every 4 (four) hours as needed for severe pain. 02/25/23  Yes Doreatha Massed, MD    Current Facility-Administered Medications  Medication Dose Route Frequency Provider Last Rate Last Admin   octreotide (SANDOSTATIN) 500 mcg in sodium chloride 0.9 % 250 mL (2 mcg/mL) infusion  50 mcg/hr Intravenous Continuous Tanda Rockers A, DO 25 mL/hr at 02/17/2023 1018 50 mcg/hr  at 03/12/2023 1018   [START ON 03/13/2023] pantoprazole (PROTONIX) injection 40 mg  40 mg Intravenous Q12H Tanda Rockers A, DO       pantoprozole (PROTONIX) 80 mg /NS 100 mL infusion  8 mg/hr Intravenous Continuous Tanda Rockers A, DO 10 mL/hr at 02/20/2023 2694 8 mg/hr at 02/21/2023 8546   prothrombin complex conc human (KCENTRA) IVPB 5,320 Units  5,320 Units Intravenous STAT Sloan Leiter, DO       Current Outpatient Medications  Medication Sig Dispense Refill   allopurinol (ZYLOPRIM) 300 MG tablet Take 1 tablet (300 mg total) by mouth 2 (two) times daily. 60 tablet 2   amiodarone (PACERONE) 200 MG tablet Take 2 tablets (400 mg total) by mouth 2 (two) times  daily for 7 days, THEN 1 tablet (200 mg total) daily. 111 tablet 0   apixaban (ELIQUIS) 5 MG TABS tablet Take 1 tablet (5 mg total) by mouth 2 (two) times daily. 60 tablet 2   atorvastatin (LIPITOR) 40 MG tablet TAKE ONE (1) TABLET BY MOUTH EVERY DAY 90 tablet 3   cholecalciferol (VITAMIN D3) 25 MCG (1000 UNIT) tablet Take 5,000 Units by mouth every other day.     ferrous sulfate 324 (65 Fe) MG TBEC Take 324 mg by mouth every other day.     loperamide (IMODIUM) 2 MG capsule Take 1 capsule (2 mg total) by mouth as needed for diarrhea or loose stools (2 caps after first loose stool and 1 after each loose stool therafter, not to exceed 8 in a 24 hour period of time.). 30 capsule 0   oxyCODONE (OXY IR/ROXICODONE) 5 MG immediate release tablet Take 1 tablet (5 mg total) by mouth every 4 (four) hours as needed for severe pain. 84 tablet 0    Allergies as of 02/28/2023 - Review Complete 02/15/2023  Allergen Reaction Noted   Gazyva [obinutuzumab] Shortness Of Breath and Other (See Comments) 02/27/2023    Family History  Problem Relation Age of Onset   Diabetes Mother    Cancer Father    Cancer Brother     Social History   Socioeconomic History   Marital status: Married    Spouse name: Not on file   Number of children: Not on file   Years of education: Not on file   Highest education level: Not on file  Occupational History   Not on file  Tobacco Use   Smoking status: Former    Types: Cigarettes   Smokeless tobacco: Former    Types: Chew, Snuff   Tobacco comments:    03/06/2017 "quit smoking when I was young; quit chew/snuff in ~ 2008"  Vaping Use   Vaping status: Never Used  Substance and Sexual Activity   Alcohol use: Yes    Alcohol/week: 2.0 standard drinks of alcohol    Types: 2 Cans of beer per week    Comment: 2-3 drinks weekly   Drug use: No   Sexual activity: Yes  Other Topics Concern   Not on file  Social History Narrative   Married since 2001,third.Lives with  wife.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   Social Determinants of Health   Financial Resource Strain: Low Risk  (05/16/2020)   Overall Financial Resource Strain (CARDIA)    Difficulty of Paying Living Expenses: Not hard at all  Food Insecurity: No Food Insecurity (03/04/2023)   Hunger Vital Sign    Worried About Running Out of Food in the Last Year: Never true    Ran Out of Food in the  Last Year: Never true  Transportation Needs: No Transportation Needs (03/04/2023)   PRAPARE - Administrator, Civil Service (Medical): No    Lack of Transportation (Non-Medical): No  Physical Activity: Sufficiently Active (05/16/2020)   Exercise Vital Sign    Days of Exercise per Week: 5 days    Minutes of Exercise per Session: 30 min  Stress: No Stress Concern Present (05/16/2020)   [employer] of Occupational Health - Occupational Stress Questionnaire    Feeling of Stress : Not at all  Social Connections: Moderately Integrated (05/16/2020)   Social Connection and Isolation Panel [NHANES]    Frequency of Communication with Friends and Family: More than three times a week    Frequency of Social Gatherings with Friends and Family: More than three times a week    Attends Religious Services: More than 4 times per year    Active Member of [employer] or Organizations: No    Attends Banker Meetings: Never    Marital Status: Married  Catering manager Violence: Not At Risk (02/28/2023)   Humiliation, Afraid, Rape, and Kick questionnaire    Fear of Current or Ex-Partner: No    Emotionally Abused: No    Physically Abused: No    Sexually Abused: No     Review of Systems   Limited due to level of acuity; see above.   Physical Exam   Vital Signs in last 24 hours: Temp:  [97.7 F (36.5 C)-98 F (36.7 C)] 98 F (36.7 C) (08/26 1104) Pulse Rate:  [78-89] 83 (08/26 1145) Resp:  [12-26] 24 (08/26 1145) BP: (94-112)/(44-51) 104/47 (08/26 1145) SpO2:  [93 %-98 %] 96 % (08/26 1145) Weight:   [126.1 kg] 126.1 kg (08/26 0901)    General:   Resting with eyes closed, acutely ill-appearing, pale Head:  Normocephalic and atraumatic. Eyes:  pale conjuctiva Ears:  Normal auditory acuity. Lungs:  Clear throughout to auscultation.   Tachypneic  Heart:  S1 S2 present without murmurs,  Abdomen:  Soft, nontender and nondistended. Large AP diameter Rectal: deferred   Msk:  Symmetrical without gross deformities. Normal posture. Extremities:  With 2+ pitting edema extending to thigh Neurologic:  arouses to verbal stimuli, drowsy   Intake/Output from previous day: No intake/output data recorded. Intake/Output this shift: No intake/output data recorded.    Labs/Studies   Recent Labs Recent Labs    02/16/2023 0855  WBC 8.2  HGB 6.4*  HCT 19.8*  PLT 19*   BMET Recent Labs    03/14/2023 0855  NA 131*  K 4.8  CL 103  CO2 18*  GLUCOSE 109*  BUN 44*  CREATININE 1.04  CALCIUM 8.2*   LFT Recent Labs    02/17/2023 0855  PROT 3.5*  ALBUMIN 2.0*  AST 59*  ALT 45*  ALKPHOS 107  BILITOT 2.7*   PT/INR Recent Labs    03/02/2023 0855  LABPROT 19.5*  INR 1.6*    Radiology/Studies DG Chest Portable 1 View  Result Date: 02/25/2023 CLINICAL DATA:  Rectal bleeding.  Vomiting.  Fall. EXAM: PORTABLE CHEST 1 VIEW COMPARISON:  03/01/2023. FINDINGS: Low lung volume. Redemonstration of nonspecific right pericardiac opacity, similar to the prior study. Bilateral lung fields are otherwise clear. Bilateral lateral costophrenic angles are clear. Stable cardio-mediastinal silhouette. No acute osseous abnormalities. The soft tissues are within normal limits. IMPRESSION: 1. Low lung volume. 2. Nonspecific right pericardiac opacity, similar to the prior study. Electronically Signed   By: Jules Schick M.D.   On:  03/02/2023 10:17     Assessment   TAYVION PESSIN is a 67 y.o. year old male with a history of CLL, CAD, HTN, STEMI in 2019, on Eliiquis BID with last dose yesterday evening, IDA in  2022 due to bleeding duodenal nodule,  presenting to the ED via EMS due to acute onset of rectal bleeding.   Now admitted with acute blood loss anemia in setting of rapid GI bleeding; currently receiving 1 of 2 units PRBCs. KCENTRA has been ordered but not received yet as pharmacy preparing. 2 units platelets also ordered due to worsened thrombocytopenia from baseline (platelets 19). CTA has been ordered and pending.  Continue with resuscitative measures. Patient may need transfer to [facility] in light of multimorbidity and hemodynamically significant GI bleeding.     Plan / Recommendations    CTA stat: further recommendations once reviewed Agree with KCENTRA, blood products, platelets Continue octreotide and PPI infusion May ultimately need transfer to [facility]     02/20/2023, 12:37 PM  Gelene Mink, PhD, ANP-BC [facility] Gastroenterology

## 2023-03-16 NOTE — ED Notes (Signed)
Clots of blood in stool, states "I feel like I continuously have to have a BM when I move" full linen change, brief applied to pt, male pw applied to pt, per care performed

## 2023-03-16 NOTE — ED Notes (Signed)
Pharmacy to bring down [facility] when it is ready

## 2023-03-16 NOTE — ED Notes (Signed)
Pts last dose of eliquis he took was around 7 pm lastnigt

## 2023-03-16 NOTE — ED Notes (Signed)
1359: verbal order to give calcium gluconate IV

## 2023-03-16 NOTE — ED Provider Notes (Signed)
[place] EMERGENCY DEPARTMENT AT [facility] Provider Note  CSN: [id number] Arrival date & time: 03/08/2023 4098  Chief Complaint(s) Rectal Bleeding  HPI Adam Reyes is a 67 y.o. male with past medical history as below, significant for CLL, CAD, HTN, A-fib, IDA who presents to the ED with complaint of bleeding, vomiting.  Patient reports he has been feeling "low" over the past few months.  He noticed dark red-colored stool this morning, profuse.  Also vomited multiple times what appears to be coffee colored, coffee-ground colored emesis.  He is undergoing treatment for CLL, follows with Dr. Ellin Saba in [place].  He is on Eliquis for A-fib.  Variable compliance on medications.  No alcohol use in the past few months.  He has no significant abdominal pain or chest pain.  He is extremely fatigued.  Per EMS patient had brief LOC when trying to stand up to transition to EMS stretcher at home.  Denies history of prior blood transfusion  Past Medical History Past Medical History:  Diagnosis Date   Chronic lymphocytic leukemia (CLL), B-cell (HCC)    Small Cell Lymphoma   Coronary artery disease, non-occlusive 02/2017   Cardiac cath in setting of MI: 50 and 55% bifurcation LAD-Diag1   Enlarged lymph node    left neck   Hypertension    STEMI (ST elevation myocardial infarction) (HCC) 03/05/2017   hx/notes 03/05/2017 -likely aborted anterior STEMI with 50% bifurcation LAD-Diag1.  No PCI.  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   Syncope and collapse 02/26/2023   Physical deconditioning 02/26/2023   Hospital discharge follow-up 02/26/2023   Drug-induced constipation 02/26/2023   Atrial fibrillation with RVR (HCC) 02/13/2023   Hemodynamic instability 02/13/2023   Hypotension 02/12/2023   Question of Richter's transformation 02/12/2023   Hyponatremia 02/12/2023    Elevated liver enzymes 02/12/2023   AKI (acute kidney injury) (HCC) 02/12/2023   Prediabetes 01/31/2023   Encounter for general adult medical examination with abnormal findings 05/16/2022   Refused influenza vaccine 08/24/2021   Essential hypertension 08/06/2021   Aortic atherosclerosis (HCC) 08/06/2021   History of gastrointestinal bleeding 01/24/2021   Iron deficiency anemia 09/22/2020   Symptomatic anemia 09/18/2020   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 Authorizing Provider  allopurinol (ZYLOPRIM) 300 MG tablet Take 1 tablet (300 mg total) by mouth 2 (two) times daily. 03/02/23  Yes Vassie Loll, MD  amiodarone (PACERONE) 200 MG tablet Take 2 tablets (400 mg total) by mouth 2 (two) times daily for 7 days, THEN 1 tablet (200 mg total) daily. 02/18/23 05/19/23 Yes Almon Hercules, MD  apixaban (ELIQUIS) 5 MG TABS tablet Take 1 tablet (5 mg total) by mouth 2 (two) times daily. 02/18/23  Yes Almon Hercules, MD  atorvastatin (LIPITOR) 40 MG tablet TAKE ONE (1) TABLET BY MOUTH EVERY DAY 02/14/23  Yes Marykay Lex, MD  cholecalciferol (VITAMIN D3) 25 MCG (1000 UNIT) tablet Take 5,000 Units by mouth every other day. 10/09/20  Yes [provider]  ferrous sulfate 324 (65 Fe)  MG TBEC Take 324 mg by mouth every other day.   Yes [provider]  loperamide (IMODIUM) 2 MG capsule Take 1 capsule (2 mg total) by mouth as needed for diarrhea or loose stools (2 caps after first loose stool and 1 after each loose stool therafter, not to exceed 8 in a 24 hour period of time.). 03/03/23  Yes Doreatha Massed, MD  oxyCODONE (OXY IR/ROXICODONE) 5 MG immediate release tablet Take 1 tablet (5 mg total) by mouth every 4 (four) hours as needed for severe pain. 02/25/23  Yes Doreatha Massed, MD                                                                                                                                    Past Surgical History Past Surgical History:  Procedure Laterality Date   BIOPSY  10/10/2020   Procedure: BIOPSY;  Surgeon: Dolores Frame, MD;  [facility];  Service: Gastroenterology;;   BIOPSY  11/14/2020   Procedure: BIOPSY;  Surgeon: Dolores Frame, MD;  [facility];  Service: Gastroenterology;;   COLONOSCOPY N/A 10/11/2015   Procedure: COLONOSCOPY;  Surgeon: Malissa Hippo, MD;  [facility];  Service: Endoscopy;  Laterality: N/A;  10/11/2015   COLONOSCOPY WITH PROPOFOL N/A 10/10/2020   Procedure: COLONOSCOPY WITH PROPOFOL;  Surgeon: Dolores Frame, MD;  [facility];  Service: Gastroenterology;  Laterality: N/A;  AM   ENTEROSCOPY N/A 11/14/2020   Procedure: push enteroscopy;  Surgeon: Dolores Frame, MD;  [facility];  Service: Gastroenterology;  Laterality: N/A;   ESOPHAGOGASTRODUODENOSCOPY (EGD) WITH PROPOFOL N/A 10/10/2020   Procedure: ESOPHAGOGASTRODUODENOSCOPY (EGD) WITH PROPOFOL;  Surgeon: Dolores Frame, MD;  [facility];  Service: Gastroenterology;  Laterality: N/A;   ESOPHAGOGASTRODUODENOSCOPY (EGD) WITH PROPOFOL N/A 11/14/2020   Procedure: ESOPHAGOGASTRODUODENOSCOPY (EGD) WITH PROPOFOL;  Surgeon: Dolores Frame, MD;  [facility];  Service: Gastroenterology;  Laterality: N/A;  12:00   GIVENS CAPSULE STUDY N/A 11/07/2020   Procedure: GIVENS CAPSULE STUDY;  Surgeon: Dolores Frame, MD;  [facility];  Service: Gastroenterology;  Laterality: N/A;  7:30 am   Graded Exercise Tolerance Test (GXT/ETT)   05/2017   10.7 METs (9: 25 min.  Reached 103% max predicted heart rate).  No EKG findings to suggest coronary ischemia.  Negative, low risk GXT   HEMOSTASIS CLIP PLACEMENT  11/14/2020   Procedure: HEMOSTASIS CLIP PLACEMENT;  Surgeon: Dolores Frame, MD;  [facility];  Service: Gastroenterology;;  small bowel nodule   LEFT HEART CATH AND CORONARY ANGIOGRAPHY N/A 03/05/2017   Procedure: LEFT HEART CATH AND CORONARY ANGIOGRAPHY;  Surgeon: Marykay Lex, MD;  Location: [facility] INVASIVE CV LAB;  Service: Cardiovascular: pLAD-Diag1 50-55% (non-flow-limiiting).  EF ~50-55%.  although bifurcation lesion was presumed Culprit - no PCI (not flow limiting). - Med Rx.   MASS  BIOPSY Left 06/27/2015   Procedure: OPEN LEFT NECK BIOPSY ;  Surgeon: Newman Pies, MD;  Location: [facility];  Service: ENT;  Laterality: Left;   POLYPECTOMY  10/10/2020   Procedure: POLYPECTOMY;  Surgeon: Dolores Frame, MD;  [facility];  Service: Gastroenterology;;   REFRACTIVE SURGERY Bilateral    Family History Family History  Problem Relation Age of Onset   Diabetes Mother    Cancer Father    Cancer Brother     Social History Social History   Tobacco Use   Smoking status: Former    Types: Cigarettes   Smokeless tobacco: Former    Types: Chew, Snuff   Tobacco comments:    03/06/2017 "quit smoking when I was young; quit chew/snuff in ~ 2008"  Vaping Use   Vaping status: Never Used  Substance Use Topics   Alcohol use: Yes    Alcohol/week: 2.0 standard drinks of alcohol    Types: 2 Cans of beer per week    Comment: 2-3 drinks weekly   Drug use: No   Allergies Gazyva [obinutuzumab]  Review of Systems Review of Systems  Constitutional:  Positive for fatigue. Negative for chills and fever.  HENT:  Negative for facial swelling and trouble swallowing.   Eyes:  Negative for photophobia and visual disturbance.  Respiratory:  Positive for shortness of breath. Negative  for cough.   Cardiovascular:  Positive for leg swelling. Negative for chest pain and palpitations.  Gastrointestinal:  Positive for blood in stool, diarrhea, nausea and vomiting. Negative for abdominal pain.  Endocrine: Negative for polydipsia and polyuria.  Genitourinary:  Negative for difficulty urinating and hematuria.  Musculoskeletal:  Negative for gait problem and joint swelling.  Skin:  Negative for pallor and rash.  Neurological:  Positive for syncope. Negative for headaches.  Psychiatric/Behavioral:  Negative for agitation and confusion.     Physical Exam Vital Signs  I have reviewed the triage vital signs BP (!) 147/107   Pulse (!) 104   Temp (!) 97.4 F (36.3 C) (Oral)   Resp (!) 62   Ht 6\' 4"  (1.93 m)   Wt 126.1 kg   SpO2 (!) 66%   BMI 33.84 kg/m  Physical Exam Vitals and nursing note reviewed. Exam conducted with a chaperone present (RN Mosetta Anis).  Constitutional:      General: He is not in acute distress.    Appearance: He is well-developed. He is ill-appearing.  HENT:     Head: Normocephalic and atraumatic.     Comments: Dried emesis around his mouth, appears to be coffee-ground colored    Right Ear: External ear normal.     Left Ear: External ear normal.     Mouth/Throat:     Mouth: Mucous membranes are moist.  Eyes:     General: No scleral icterus. Cardiovascular:     Rate and Rhythm: Normal rate and regular rhythm.     Pulses: Normal pulses.     Heart sounds: Normal heart sounds.  Pulmonary:     Effort: Pulmonary effort is normal. No respiratory distress.     Breath sounds: Normal breath sounds.  Abdominal:     General: Abdomen is flat.     Palpations: Abdomen is soft.     Tenderness: There is no abdominal tenderness.  Musculoskeletal:     Cervical back: No rigidity.     Right lower leg: Edema present.     Left lower leg: Edema present.  Skin:  General: Skin is warm and dry.     Capillary Refill: Capillary refill takes less than 2  seconds.     Coloration: Skin is pale.  Neurological:     Mental Status: He is alert and oriented to person, place, and time.     GCS: GCS eye subscore is 4. GCS verbal subscore is 5. GCS motor subscore is 6.  Psychiatric:        Mood and Affect: Mood normal.        Behavior: Behavior normal.   hemocc  ED Results and Treatments Labs (all labs ordered are listed, but only abnormal results are displayed) Labs Reviewed  CBC WITH DIFFERENTIAL/PLATELET - Abnormal; Notable for the following components:      Result Value   RBC 1.87 (*)    Hemoglobin 6.4 (*)    HCT 19.8 (*)    MCV 105.9 (*)    MCH 34.2 (*)    RDW 18.5 (*)    Platelets 19 (*)    nRBC 1.0 (*)    nRBC 3 (*)    Abs Immature Granulocytes 0.20 (*)    All other components within normal limits  COMPREHENSIVE METABOLIC PANEL - Abnormal; Notable for the following components:   Sodium 131 (*)    CO2 18 (*)    Glucose, Bld 109 (*)    BUN 44 (*)    Calcium 8.2 (*)    Total Protein 3.5 (*)    Albumin 2.0 (*)    AST 59 (*)    ALT 45 (*)    Total Bilirubin 2.7 (*)    All other components within normal limits  PROTIME-INR - Abnormal; Notable for the following components:   Prothrombin Time 19.5 (*)    INR 1.6 (*)    All other components within normal limits  LACTIC ACID, PLASMA - Abnormal; Notable for the following components:   Lactic Acid, Venous 5.9 (*)    All other components within normal limits  LACTIC ACID, PLASMA - Abnormal; Notable for the following components:   Lactic Acid, Venous 7.1 (*)    All other components within normal limits  POC OCCULT BLOOD, ED - Abnormal; Notable for the following components:   Fecal Occult Bld POSITIVE (*)    All other components within normal limits  TROPONIN I (HIGH SENSITIVITY) - Abnormal; Notable for the following components:   Troponin I (High Sensitivity) 32 (*)    All other components within normal limits  TROPONIN I (HIGH SENSITIVITY) - Abnormal; Notable for the following  components:   Troponin I (High Sensitivity) 34 (*)    All other components within normal limits  LIPASE, BLOOD  BRAIN NATRIURETIC PEPTIDE  TYPE AND SCREEN  ABO/RH  PREPARE PLATELET PHERESIS  TRANSFUSION REACTION  PREPARE RBC (CROSSMATCH)                                                                                                                          Radiology DG  Chest Portable 1 View  Result Date: 02/17/2023 CLINICAL DATA:  Rectal bleeding,   hematemesis, pale EXAM: PORTABLE CHEST - 1 VIEW COMPARISON:  02/15/2023 FINDINGS: Relatively low lung volumes with atelectasis in the lung bases left greater than right as before. Heart size and mediastinal contours are within normal limits. No effusion. Widened right AC joint. IMPRESSION: Low volumes.  No acute findings. Electronically Signed   By: Corlis Leak M.D.   On: 02/16/2023 14:49   CT ANGIO GI BLEED  Result Date: 03/01/2023 CLINICAL DATA:  LGIB EXAM: CTA ABDOMEN AND PELVIS WITHOUT AND WITH CONTRAST TECHNIQUE: Multidetector CT imaging of the abdomen and pelvis was performed using the standard protocol during bolus administration of intravenous contrast. Multiplanar reconstructed images and MIPs were obtained and reviewed to evaluate the vascular anatomy. RADIATION DOSE REDUCTION: This exam was performed according to the departmental dose-optimization program which includes automated exposure control, adjustment of the mA and/or kV according to patient size and/or use of iterative reconstruction technique. CONTRAST:  OMNIPAQUE IOHEXOL 350 MG/ML SOLN COMPARISON:  02/12/2023 FINDINGS: VASCULAR Aorta: Minimal calcified plaque. No aneurysm, dissection, or stenosis. Celiac: Short-segment stenosis at the level of the median arcuate ligament of the diaphragm of at least moderately severity, patent distally. SMA: Patent without evidence of aneurysm, dissection, vasculitis or significant stenosis. Renals: Both renal arteries are patent without  evidence of aneurysm, dissection, vasculitis, fibromuscular dysplasia or significant stenosis. IMA: Patent without evidence of aneurysm, dissection, vasculitis or significant stenosis. Inflow: Patent without evidence of aneurysm, dissection, vasculitis or significant stenosis. Proximal Outflow: Bilateral common femoral and visualized portions of the superficial and profunda femoral arteries are patent without evidence of aneurysm, dissection, vasculitis or significant stenosis. Veins: Patent portal vein, SM V, splenic vein, bilateral renal veins. Compression of left iliac vein by regional adenopathy. Iliac confluence and IVC remain patent. Review of the MIP images confirms the above findings. NON-VASCULAR Lower chest: Small bilateral pleural effusions. Dependent atelectasis in the lung bases. New bilateral pulmonary nodules in the lung bases, largest 1.2 cm adjacent to the minor fissure on the right. Bulky subcarinal adenopathy, and bilateral hilar adenopathy. Hepatobiliary: No focal liver abnormality is seen. No gallstones, gallbladder wall thickening, or biliary dilatation. Pancreas: Unremarkable. No pancreatic ductal dilatation or surrounding inflammatory changes. Spleen: 19.1 cm maximum transverse diameter, without focal lesion. Adrenals/Urinary Tract: No adrenal mass. 7 cm left upper pole renal cyst, present since 06/29/2017; no follow-up required. No urolithiasis or hydronephrosis. Urinary bladder compressed by bulky pelvic adenopathy. Stomach/Bowel: No active GI bleed. Stomach decompressed. Small bowel nondistended. Colon is partially distended with a few distal descending and proximal sigmoid diverticula; no adjacent inflammatory change. Lymphatic: Progressive extensive bulky retroperitoneal, pelvic, and inguinal adenopathy. Multiple small mesenteric lymph nodes. 4.8 cm left para-aortic adenopathy previously 6.5. 3.6 cm presacral adenopathy, previously 3.1. 5.9 cm left external iliac node, previously 4.5 cm.  Reproductive: Mild prostate enlargement Other: No ascites.  No free air. Musculoskeletal: Spondylitic changes in the lower lumbar spine. No fracture or acute bone abnormality. IMPRESSION: 1. No active GI bleed. 2. Short-segment celiac artery stenosis at the level of the median arcuate ligament of the diaphragm of at least moderately severity. 3. Progressive extensive bulky retroperitoneal, pelvic, and inguinal adenopathy. 4. New bilateral pulmonary nodules in the lung bases, largest 1.2 cm adjacent to the minor fissure on the right. 5. Small bilateral pleural effusions. 6. Splenomegaly. 7. Mild prostate enlargement. 8.  Aortic Atherosclerosis (ICD10-I70.0). Electronically Signed   By: Corlis Leak M.D.   On:  02/25/2023 13:19   DG Chest Portable 1 View  Result Date: 02/22/2023 CLINICAL DATA:  Rectal bleeding.  Vomiting.  Fall. EXAM: PORTABLE CHEST 1 VIEW COMPARISON:  03/01/2023. FINDINGS: Low lung volume. Redemonstration of nonspecific right pericardiac opacity, similar to the prior study. Bilateral lung fields are otherwise clear. Bilateral lateral costophrenic angles are clear. Stable cardio-mediastinal silhouette. No acute osseous abnormalities. The soft tissues are within normal limits. IMPRESSION: 1. Low lung volume. 2. Nonspecific right pericardiac opacity, similar to the prior study. Electronically Signed   By: Jules Schick M.D.   On: 02/24/2023 10:17    Pertinent labs & imaging results that were available during my care of the patient were reviewed by me and considered in my medical decision making (see MDM for details).  Medications Ordered in ED Medications  pantoprozole (PROTONIX) 80 mg /NS 100 mL infusion (0 mg/hr Intravenous Stopped 03/03/2023 1400)  pantoprazole (PROTONIX) injection 40 mg (has no administration in time range)  octreotide (SANDOSTATIN) 2 mcg/mL load via infusion 100 mcg (100 mcg Intravenous Bolus from Bag 02/28/2023 1021)    And  octreotide (SANDOSTATIN) 500 mcg in sodium  chloride 0.9 % 250 mL (2 mcg/mL) infusion (50 mcg/hr Intravenous New Bag/Given 02/14/2023 1018)  prothrombin complex conc human (KCENTRA) IVPB 5,320 Units (has no administration in time range)  0.9 %  sodium chloride infusion (has no administration in time range)  norepinephrine (LEVOPHED) 4mg  in (0.016 mg/mL) premix infusion (has no administration in time range)  sodium chloride 0.9 % bolus 1,000 mL (0 mLs Intravenous Stopped 03/04/2023 0945)  pantoprazole (PROTONIX) 80 mg /NS 100 mL IVPB (0 mg Intravenous Stopped 02/15/2023 0925)  ondansetron (ZOFRAN) injection 4 mg (4 mg Intravenous Given 02/21/2023 0843)  cefTRIAXone (ROCEPHIN) 2 g in sodium chloride 0.9 % 100 mL IVPB (0 g Intravenous Stopped 03/04/2023 0941)  0.9 %  sodium chloride infusion (Manually program via Guardrails IV Fluids) (0 mLs Intravenous Stopped 02/27/2023 1114)  sodium chloride 0.9 % bolus 1,000 mL (1,000 mLs Intravenous Bolus 03/07/2023 1115)  0.9 %  sodium chloride infusion (Manually program via Guardrails IV Fluids) ( Intravenous New Bag/Given 02/19/2023 1049)  iohexol (OMNIPAQUE) 350 MG/ML injection 100 mL (100 mLs Intravenous Contrast Given 03/09/2023 1227)  EPINEPHrine (ADRENALIN) 1 MG/10ML injection (1 mg Intravenous Given 02/27/2023 1410)  sodium bicarbonate injection (50 mEq Intravenous Given 02/23/2023 1408)  amiodarone (CORDARONE) injection (300 mg Intravenous Given 02/18/2023 1411)  EPINEPHrine (ADRENALIN) 1 MG/10ML injection (1 mg Intravenous Given 02/19/2023 1419)                                                                                                                                     Procedures .Critical Care  Performed by: Sloan Leiter, DO Authorized by: Sloan Leiter, DO   Critical care provider statement:    Critical care time (minutes):  79   Critical care time was exclusive of:  Separately billable procedures  and treating other patients   Critical care was necessary to treat or prevent imminent or life-threatening  deterioration of the following conditions:  Circulatory failure and shock   Critical care was time spent personally by me on the following activities:  Development of treatment plan with patient or surrogate, discussions with consultants, evaluation of patient's response to treatment, examination of patient, ordering and review of laboratory studies, ordering and review of radiographic studies, ordering and performing treatments and interventions, pulse oximetry, re-evaluation of patient's condition, review of old charts and obtaining history from patient or surrogate   Care discussed with: admitting provider   Procedure Name: Intubation Date/Time: 02/28/2023 2:00 PM  Performed by: Sloan Leiter, DOPre-anesthesia Checklist: Patient identified, Patient being monitored, Emergency Drugs available, Timeout performed and Suction available Oxygen Delivery Method: Ambu bag Preoxygenation: Pre-oxygenation with 100% oxygen Ventilation: Mask ventilation with difficulty Laryngoscope Size: Glidescope Tube size: 7.5 mm Number of attempts: 1 Placement Confirmation: ETT inserted through vocal cords under direct vision, CO2 detector and Breath sounds checked- equal and bilateral Secured at: 24 cm Tube secured with: ETT holder Dental Injury: Teeth and Oropharynx as per pre-operative assessment  Difficulty Due To: Difficulty was anticipated Comments: Profuse frothy secretions from trachea       (including critical care time)  Medical Decision Making / ED Course    Medical Decision Making:    MARCQUISE FURNESS is a 67 y.o. male with past medical history as below, significant for CLL, CAD, HTN, A-fib, IDA who presents to the ED with complaint of bleeding, vomiting. The complaint involves an extensive differential diagnosis and also carries with it a high risk of complications and morbidity.  Serious etiology was considered. Ddx includes but is not limited to: Upper GI bleed, lower GI bleed, diverticular  bleed, PUD, variceal bleed, symptomatic anemia, etc.  Complete initial physical exam performed, notably the patient  was pale, ill appearing, dried hematemesis around his mouth.  Abdomen nonperitoneal, borderline hypotensive.    Reviewed and confirmed nursing documentation for past medical history, family history, social history.  Vital signs reviewed.    Narrative: 67 year old male history of CLL, A-fib on Eliquis here secondary to fatigue x2-3 mos, vomiting, diarrhea.  Concern for bleeding. Report of brief syncopal episode with standing per EMS at home  Clinical Course as of 02/24/2023 1629  Mon Mar 10, 2023  0910 Obvious melena on exam [SG]  0932 Echo 8/24 LVEF 70-75%, G2DD [SG]  0947 Hemoglobin(!!): 6.4 [SG]  0947 Platelets(!!): 19 [SG]  0947 Fecal Occult Blood, POC(!): POSITIVE He is on eliquis, give 2u PRBC [SG]  0948 Hgb 11.8 four days ago [SG]  0948 Prior eval by GI Dr Levon Hedger  [SG]  [phone] GI Dr Levon Hedger   Cscope 3/22 with polyps removed and diverticulosis   Endoscopy 5/22 with bleeding polyp, removed [SG]  1044 BP downtrending, starting PRBC [SG]  1051 Last eliquis was 1900 yestd [SG]  1053 Will d/w GI, imaging pending  [SG]  1114 Diffuse rectal bleeding, will order kcentra [SG]  1117 Concern patient will need IR intervention given brisk LGIB, will likely require txfr to [facility] for eval.  [SG]  1134 Spoke with GI, they will come see pt [SG]  1336 CT angio neg, spoke again w/ Dr Marletta Lor, recommend ICU at [facility] [SG]    Clinical Course User Index [SG] Sloan Leiter, DO   GI consulted, recommend further resuscitation  Consider possible diverticular bleed  Admitted PCCM at [facility] w/ GI on consult  Called to bedside, pt reporting difficulty breathing, becoming hypotensive. Plan to start peripheral levophed and rpt CXR Pulses lost and ACLS protocol was started Intubated by myself, concern for pulm edema/frothy secretions in posterior airway  ACLS protocol was followed, see  nursing documentation. Pt with frothy secretions on exam and dyspnea, possible transfusion reaction but unclear. He is profoundly anemia as well and that seems likely primary source of his decline today. Family was at bedside and resuscitation efforts were stopped. Asystole on 3 lead EKG/life pak and with bedside echo, no spontaneous respirations on exam, no purposeful movement.  Time of death 1420. Not a ME case, PCP was notified Dr Allena Katz for death certificate. Patient's spouse at bedside (ann Colborn).                     Additional history obtained: -Additional history obtained from EMS, spouse -External records from outside source obtained and reviewed including: Chart review including previous notes, labs, imaging, consultation notes including  Primary care documentation Prior labs / imaging Home meds   Lab Tests: -I ordered, reviewed, and interpreted labs.   The pertinent results include:   Labs Reviewed  CBC WITH DIFFERENTIAL/PLATELET - Abnormal; Notable for the following components:      Result Value   RBC 1.87 (*)    Hemoglobin 6.4 (*)    HCT 19.8 (*)    MCV 105.9 (*)    MCH 34.2 (*)    RDW 18.5 (*)    Platelets 19 (*)    nRBC 1.0 (*)    nRBC 3 (*)    Abs Immature Granulocytes 0.20 (*)    All other components within normal limits  COMPREHENSIVE METABOLIC PANEL - Abnormal; Notable for the following components:   Sodium 131 (*)    CO2 18 (*)    Glucose, Bld 109 (*)    BUN 44 (*)    Calcium 8.2 (*)    Total Protein 3.5 (*)    Albumin 2.0 (*)    AST 59 (*)    ALT 45 (*)    Total Bilirubin 2.7 (*)    All other components within normal limits  PROTIME-INR - Abnormal; Notable for the following components:   Prothrombin Time 19.5 (*)    INR 1.6 (*)    All other components within normal limits  LACTIC ACID, PLASMA - Abnormal; Notable for the following components:   Lactic Acid, Venous 5.9 (*)    All other components within normal limits  LACTIC ACID,  PLASMA - Abnormal; Notable for the following components:   Lactic Acid, Venous 7.1 (*)    All other components within normal limits  POC OCCULT BLOOD, ED - Abnormal; Notable for the following components:   Fecal Occult Bld POSITIVE (*)    All other components within normal limits  TROPONIN I (HIGH SENSITIVITY) - Abnormal; Notable for the following components:   Troponin I (High Sensitivity) 32 (*)    All other components within normal limits  TROPONIN I (HIGH SENSITIVITY) - Abnormal; Notable for the following components:   Troponin I (High Sensitivity) 34 (*)    All other components within normal limits  LIPASE, BLOOD  BRAIN NATRIURETIC PEPTIDE  TYPE AND SCREEN  ABO/RH  PREPARE PLATELET PHERESIS  TRANSFUSION REACTION  PREPARE RBC (CROSSMATCH)    Notable for critical anemia, LA +++, trop +  EKG   EKG Interpretation Date/Time:  Monday March 10 2023 08:54:06 EDT Ventricular Rate:  85 PR Interval:  193 QRS  Duration:  102 QT Interval:  409 QTC Calculation: 487 R Axis:   30  Text Interpretation: Sinus rhythm Low voltage, extremity leads Abnormal R-wave progression, early transition Borderline prolonged QT interval Confirmed by Tanda Rockers ([id number]) on 02/24/2023 11:17:37 AM         Imaging Studies ordered: I ordered imaging studies including CXR CTA gi  I independently visualized the following imaging with scope of interpretation limited to determining acute life threatening conditions related to emergency care; findings noted above, significant for no acute GI bleed, pulm effusions, adenopathy  I independently visualized and interpreted imaging. I agree with the radiologist interpretation   Medicines ordered and prescription drug management: Meds ordered this encounter  Medications   sodium chloride 0.9 % bolus 1,000 mL   pantoprazole (PROTONIX) 80 mg /NS 100 mL IVPB   pantoprozole (PROTONIX) 80 mg /NS 100 mL infusion   pantoprazole (PROTONIX) injection 40 mg    ondansetron (ZOFRAN) injection 4 mg   cefTRIAXone (ROCEPHIN) 2 g in sodium chloride 0.9 % 100 mL IVPB    Order Specific Question:   Antibiotic Indication:    Answer:   Other Indication (list below)    Order Specific Question:   Other Indication:    Answer:   ugib   AND Linked Order Group    octreotide (SANDOSTATIN) 2 mcg/mL load via infusion 100 mcg    octreotide (SANDOSTATIN) 500 mcg in sodium chloride 0.9 % 250 mL (2 mcg/mL) infusion   0.9 %  sodium chloride infusion (Manually program via Guardrails IV Fluids)   sodium chloride 0.9 % bolus 1,000 mL   prothrombin complex conc human (KCENTRA) IVPB 5,320 Units    Administer at a rate of 3 units/kg/minute up to a maximum rate of 8.4 mL/min (~210 units/minute). Consider changing administration time to weight-based on a case-by-case basis (ie life-threatening bleed or lack of IV access sites).   0.9 %  sodium chloride infusion (Manually program via Guardrails IV Fluids)   iohexol (OMNIPAQUE) 350 MG/ML injection 100 mL   0.9 %  sodium chloride infusion   norepinephrine (LEVOPHED) 4mg  in (0.016 mg/mL) premix infusion    Order Specific Question:   IV Access    Answer:   Peripheral   EPINEPHrine (ADRENALIN) 1 MG/10ML injection   sodium bicarbonate injection   amiodarone (CORDARONE) injection   EPINEPHrine (ADRENALIN) 1 MG/10ML injection    -I have reviewed the patients home medicines and have made adjustments as needed   Consultations Obtained: I requested consultation with the GI PCCM,  and discussed lab and imaging findings as well as pertinent plan - they recommend: admit   Cardiac Monitoring: The patient was maintained on a cardiac monitor.  I personally viewed and interpreted the cardiac monitored which showed an underlying rhythm of: NSR  Social Determinants of Health:  Diagnosis or treatment significantly limited by social determinants of health: former smoker and obesity   Reevaluation: After the interventions noted  above, I reevaluated the patient and found that they have worsened  Co morbidities that complicate the patient evaluation  Past Medical History:  Diagnosis Date   Chronic lymphocytic leukemia (CLL), B-cell (HCC)    Small Cell Lymphoma   Coronary artery disease, non-occlusive 02/2017   Cardiac cath in setting of MI: 50 and 55% bifurcation LAD-Diag1   Enlarged lymph node    left neck   Hypertension    STEMI (ST elevation myocardial infarction) (HCC) 03/05/2017   hx/notes 03/05/2017 -likely aborted anterior STEMI with 50% bifurcation  LAD-Diag1.  No PCI.  Preserved EF   Syncope due to orthostatic hypotension 04/21/2018      Dispostion: Disposition decision including need for hospitalization was considered, and patient Expired    Final Clinical Impression(s) / ED Diagnoses Final diagnoses:  Gastrointestinal hemorrhage, unspecified gastrointestinal hemorrhage type  Symptomatic anemia  Anticoagulated  Cardiac arrest (HCC)  Acute respiratory failure with hypoxia (HCC)        Sloan Leiter, DO 03/02/2023 1630

## 2023-03-16 NOTE — ED Notes (Signed)
Pt appeared to be sob, gasping, MD made aware

## 2023-03-16 NOTE — ED Notes (Signed)
1407 pulse check: VT.   1407: shock at 120 J   1410: pulse check: VT   1410: shock at 200 J

## 2023-03-16 NOTE — ED Notes (Signed)
Pt had a BM, large, mainly blood and clots noted. Peri care performed and brief changed

## 2023-03-16 NOTE — ED Notes (Signed)
Pt is back from CT

## 2023-03-16 NOTE — ED Notes (Signed)
Called [facility].

## 2023-03-16 DEATH — deceased

## 2023-03-18 ENCOUNTER — Inpatient Hospital Stay: Payer: PPO

## 2023-03-18 ENCOUNTER — Telehealth: Payer: Self-pay | Admitting: Internal Medicine

## 2023-03-18 NOTE — Telephone Encounter (Signed)
Lupita Leash with [facility] called in on patient behalf   Fyi  Death certificate ion the [facility] system for patient needing signature

## 2023-03-26 ENCOUNTER — Other Ambulatory Visit (HOSPITAL_COMMUNITY): Payer: PPO

## 2023-03-27 ENCOUNTER — Inpatient Hospital Stay: Payer: PPO | Admitting: Hematology

## 2023-03-27 ENCOUNTER — Inpatient Hospital Stay: Payer: PPO

## 2023-04-01 ENCOUNTER — Ambulatory Visit: Payer: PPO | Admitting: General Practice

## 2023-04-24 ENCOUNTER — Ambulatory Visit: Payer: PPO | Admitting: Hematology

## 2023-04-24 ENCOUNTER — Ambulatory Visit: Payer: PPO

## 2023-04-24 ENCOUNTER — Other Ambulatory Visit: Payer: PPO

## 2023-06-02 ENCOUNTER — Ambulatory Visit: Payer: PPO | Admitting: Internal Medicine
